# Patient Record
Sex: Male | Born: 1935 | Race: White | Hispanic: No | Marital: Married | Smoking: Former smoker
Health system: Southern US, Community
[De-identification: ages and names within clinical notes are randomized; demographics above are authoritative.]

## PROBLEM LIST (undated history)

## (undated) DIAGNOSIS — Z860100 Personal history of colon polyps, unspecified: Secondary | ICD-10-CM

## (undated) DIAGNOSIS — H9190 Unspecified hearing loss, unspecified ear: Secondary | ICD-10-CM

## (undated) DIAGNOSIS — K862 Cyst of pancreas: Secondary | ICD-10-CM

## (undated) DIAGNOSIS — Z9889 Other specified postprocedural states: Secondary | ICD-10-CM

## (undated) DIAGNOSIS — J449 Chronic obstructive pulmonary disease, unspecified: Secondary | ICD-10-CM

## (undated) DIAGNOSIS — I509 Heart failure, unspecified: Secondary | ICD-10-CM

## (undated) DIAGNOSIS — I6529 Occlusion and stenosis of unspecified carotid artery: Secondary | ICD-10-CM

## (undated) DIAGNOSIS — H8110 Benign paroxysmal vertigo, unspecified ear: Secondary | ICD-10-CM

## (undated) DIAGNOSIS — C4490 Unspecified malignant neoplasm of skin, unspecified: Secondary | ICD-10-CM

## (undated) DIAGNOSIS — H3530 Unspecified macular degeneration: Secondary | ICD-10-CM

## (undated) DIAGNOSIS — Z86018 Personal history of other benign neoplasm: Secondary | ICD-10-CM

## (undated) DIAGNOSIS — E041 Nontoxic single thyroid nodule: Secondary | ICD-10-CM

## (undated) DIAGNOSIS — K219 Gastro-esophageal reflux disease without esophagitis: Secondary | ICD-10-CM

## (undated) DIAGNOSIS — I25810 Atherosclerosis of coronary artery bypass graft(s) without angina pectoris: Secondary | ICD-10-CM

## (undated) DIAGNOSIS — H548 Legal blindness, as defined in USA: Secondary | ICD-10-CM

## (undated) DIAGNOSIS — E7800 Pure hypercholesterolemia, unspecified: Secondary | ICD-10-CM

## (undated) DIAGNOSIS — M48061 Spinal stenosis, lumbar region without neurogenic claudication: Secondary | ICD-10-CM

## (undated) DIAGNOSIS — I639 Cerebral infarction, unspecified: Secondary | ICD-10-CM

## (undated) DIAGNOSIS — I359 Nonrheumatic aortic valve disorder, unspecified: Secondary | ICD-10-CM

## (undated) DIAGNOSIS — Z8546 Personal history of malignant neoplasm of prostate: Secondary | ICD-10-CM

## (undated) HISTORY — PX: OTHER SURGICAL HISTORY: SHX169

## (undated) HISTORY — DX: Nonrheumatic aortic valve disorder, unspecified: I35.9

## (undated) HISTORY — PX: EYE SURGERY: SHX253

## (undated) HISTORY — DX: Personal history of colonic polyps: Z86.010

## (undated) HISTORY — PX: SHOULDER SURGERY: SHX246

## (undated) HISTORY — PX: CATARACT EXTRACTION: SUR2

## (undated) HISTORY — DX: Heart failure, unspecified: I50.9

## (undated) HISTORY — DX: Other specified postprocedural states: Z86.018

## (undated) HISTORY — PX: CORONARY ARTERY BYPASS GRAFT: SHX141

## (undated) HISTORY — DX: Legal blindness, as defined in USA: H54.8

## (undated) HISTORY — DX: Atherosclerosis of coronary artery bypass graft(s) without angina pectoris: I25.810

## (undated) HISTORY — DX: Personal history of colon polyps, unspecified: Z86.0100

## (undated) HISTORY — DX: Other specified postprocedural states: Z98.890

## (undated) HISTORY — DX: Gastro-esophageal reflux disease without esophagitis: K21.9

## (undated) HISTORY — DX: Pure hypercholesterolemia, unspecified: E78.00

## (undated) HISTORY — DX: Cerebral infarction, unspecified: I63.9

## (undated) HISTORY — PX: TOTAL KNEE ARTHROPLASTY: SHX125

## (undated) HISTORY — DX: Cyst of pancreas: K86.2

## (undated) HISTORY — DX: Nontoxic single thyroid nodule: E04.1

## (undated) HISTORY — DX: Unspecified hearing loss, unspecified ear: H91.90

## (undated) HISTORY — DX: Personal history of malignant neoplasm of prostate: Z85.46

## (undated) HISTORY — DX: Spinal stenosis, lumbar region without neurogenic claudication: M48.061

## (undated) HISTORY — DX: Unspecified macular degeneration: H35.30

## (undated) HISTORY — PX: CHOLECYSTECTOMY: SHX55

## (undated) HISTORY — DX: Unspecified malignant neoplasm of skin, unspecified: C44.90

## (undated) HISTORY — DX: Occlusion and stenosis of unspecified carotid artery: I65.29

## (undated) HISTORY — DX: Benign paroxysmal vertigo, unspecified ear: H81.10

## (undated) HISTORY — DX: Chronic obstructive pulmonary disease, unspecified: J44.9

---
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
## 1983-01-23 HISTORY — PX: CRANIECTOMY FOR EXCISION OF ACOUSTIC NEUROMA: SUR324

## 1999-01-13 ENCOUNTER — Encounter (INDEPENDENT_AMBULATORY_CARE_PROVIDER_SITE_OTHER): Payer: Self-pay | Admitting: Gastroenterology

## 1999-02-06 ENCOUNTER — Encounter: Payer: Self-pay | Admitting: Gastroenterology

## 1999-02-06 ENCOUNTER — Ambulatory Visit (HOSPITAL_COMMUNITY): Admission: RE | Admit: 1999-02-06 | Discharge: 1999-02-06 | Payer: Self-pay | Admitting: Gastroenterology

## 1999-02-17 ENCOUNTER — Ambulatory Visit (HOSPITAL_COMMUNITY): Admission: RE | Admit: 1999-02-17 | Discharge: 1999-02-17 | Payer: Self-pay | Admitting: Gastroenterology

## 1999-02-17 ENCOUNTER — Encounter: Payer: Self-pay | Admitting: Gastroenterology

## 1999-07-17 ENCOUNTER — Other Ambulatory Visit: Admission: RE | Admit: 1999-07-17 | Discharge: 1999-07-17 | Payer: Self-pay | Admitting: Urology

## 1999-08-14 ENCOUNTER — Ambulatory Visit (HOSPITAL_COMMUNITY): Admission: RE | Admit: 1999-08-14 | Discharge: 1999-08-15 | Payer: Self-pay | Admitting: Cardiology

## 1999-11-03 ENCOUNTER — Other Ambulatory Visit: Admission: RE | Admit: 1999-11-03 | Discharge: 1999-11-03 | Payer: Self-pay | Admitting: Urology

## 1999-11-03 ENCOUNTER — Encounter (INDEPENDENT_AMBULATORY_CARE_PROVIDER_SITE_OTHER): Payer: Self-pay | Admitting: Specialist

## 1999-11-17 ENCOUNTER — Encounter: Admission: RE | Admit: 1999-11-17 | Discharge: 2000-02-15 | Payer: Self-pay | Admitting: Radiation Oncology

## 2000-01-23 DIAGNOSIS — Z8546 Personal history of malignant neoplasm of prostate: Secondary | ICD-10-CM

## 2000-01-23 HISTORY — DX: Personal history of malignant neoplasm of prostate: Z85.46

## 2000-02-13 ENCOUNTER — Ambulatory Visit (HOSPITAL_BASED_OUTPATIENT_CLINIC_OR_DEPARTMENT_OTHER): Admission: RE | Admit: 2000-02-13 | Discharge: 2000-02-13 | Payer: Self-pay | Admitting: Urology

## 2000-02-13 ENCOUNTER — Encounter: Payer: Self-pay | Admitting: Urology

## 2000-03-05 ENCOUNTER — Encounter: Admission: RE | Admit: 2000-03-05 | Discharge: 2000-06-03 | Payer: Self-pay | Admitting: Radiation Oncology

## 2000-09-19 ENCOUNTER — Ambulatory Visit (HOSPITAL_COMMUNITY): Admission: RE | Admit: 2000-09-19 | Discharge: 2000-09-20 | Payer: Self-pay | Admitting: Cardiology

## 2000-09-19 ENCOUNTER — Encounter: Payer: Self-pay | Admitting: Cardiology

## 2000-11-12 ENCOUNTER — Encounter: Admission: RE | Admit: 2000-11-12 | Discharge: 2000-11-12 | Payer: Self-pay | Admitting: Nephrology

## 2000-11-12 ENCOUNTER — Encounter: Payer: Self-pay | Admitting: Nephrology

## 2002-06-23 ENCOUNTER — Encounter (INDEPENDENT_AMBULATORY_CARE_PROVIDER_SITE_OTHER): Payer: Self-pay | Admitting: Gastroenterology

## 2003-05-12 ENCOUNTER — Encounter: Admission: RE | Admit: 2003-05-12 | Discharge: 2003-05-12 | Payer: Self-pay | Admitting: Family Medicine

## 2003-05-19 ENCOUNTER — Encounter: Admission: RE | Admit: 2003-05-19 | Discharge: 2003-07-14 | Payer: Self-pay | Admitting: Family Medicine

## 2003-12-09 ENCOUNTER — Ambulatory Visit: Payer: Self-pay | Admitting: Internal Medicine

## 2004-03-31 ENCOUNTER — Ambulatory Visit: Payer: Self-pay | Admitting: Cardiology

## 2004-04-04 ENCOUNTER — Ambulatory Visit: Payer: Self-pay | Admitting: Internal Medicine

## 2004-04-11 ENCOUNTER — Ambulatory Visit: Payer: Self-pay | Admitting: Acute Care

## 2004-04-18 ENCOUNTER — Ambulatory Visit: Payer: Self-pay | Admitting: Internal Medicine

## 2004-10-11 ENCOUNTER — Ambulatory Visit: Payer: Self-pay | Admitting: Internal Medicine

## 2004-10-12 ENCOUNTER — Ambulatory Visit: Payer: Self-pay | Admitting: Cardiology

## 2004-10-25 ENCOUNTER — Ambulatory Visit: Payer: Self-pay | Admitting: Internal Medicine

## 2004-10-31 ENCOUNTER — Encounter
Admission: RE | Admit: 2004-10-31 | Discharge: 2004-10-31 | Payer: Self-pay | Admitting: Physical Medicine and Rehabilitation

## 2004-11-03 ENCOUNTER — Ambulatory Visit: Payer: Self-pay | Admitting: Cardiology

## 2004-11-03 ENCOUNTER — Ambulatory Visit: Payer: Self-pay | Admitting: Internal Medicine

## 2005-01-29 ENCOUNTER — Encounter
Admission: RE | Admit: 2005-01-29 | Discharge: 2005-01-29 | Payer: Self-pay | Admitting: Physical Medicine and Rehabilitation

## 2005-05-09 ENCOUNTER — Ambulatory Visit: Payer: Self-pay | Admitting: Cardiology

## 2005-05-29 ENCOUNTER — Ambulatory Visit: Payer: Self-pay

## 2005-06-19 ENCOUNTER — Ambulatory Visit: Payer: Self-pay | Admitting: Cardiology

## 2005-10-24 ENCOUNTER — Ambulatory Visit: Payer: Self-pay | Admitting: Cardiology

## 2005-11-02 ENCOUNTER — Ambulatory Visit: Payer: Self-pay

## 2005-11-02 ENCOUNTER — Encounter: Payer: Self-pay | Admitting: Cardiology

## 2005-11-13 ENCOUNTER — Ambulatory Visit: Payer: Self-pay | Admitting: Internal Medicine

## 2006-02-22 ENCOUNTER — Ambulatory Visit: Payer: Self-pay | Admitting: Cardiology

## 2006-04-09 ENCOUNTER — Ambulatory Visit: Payer: Self-pay | Admitting: Cardiology

## 2006-06-04 ENCOUNTER — Ambulatory Visit: Payer: Self-pay

## 2006-07-11 ENCOUNTER — Ambulatory Visit: Payer: Self-pay | Admitting: Cardiology

## 2006-09-25 ENCOUNTER — Ambulatory Visit: Payer: Self-pay | Admitting: Cardiology

## 2007-01-02 ENCOUNTER — Ambulatory Visit: Payer: Self-pay

## 2007-01-02 ENCOUNTER — Ambulatory Visit: Payer: Self-pay | Admitting: Cardiology

## 2007-04-02 ENCOUNTER — Ambulatory Visit: Payer: Self-pay | Admitting: Cardiology

## 2007-06-04 ENCOUNTER — Ambulatory Visit: Payer: Self-pay

## 2007-06-04 ENCOUNTER — Ambulatory Visit: Payer: Self-pay | Admitting: Cardiology

## 2007-06-09 ENCOUNTER — Ambulatory Visit: Payer: Self-pay | Admitting: Surgery

## 2007-08-28 ENCOUNTER — Ambulatory Visit: Payer: Self-pay | Admitting: Cardiology

## 2007-12-01 ENCOUNTER — Ambulatory Visit: Payer: Self-pay | Admitting: Surgery

## 2007-12-11 ENCOUNTER — Ambulatory Visit: Payer: Self-pay | Admitting: Cardiology

## 2008-01-23 HISTORY — PX: CATARACT EXTRACTION: SUR2

## 2008-03-09 ENCOUNTER — Ambulatory Visit: Payer: Self-pay | Admitting: Cardiology

## 2008-03-09 ENCOUNTER — Ambulatory Visit: Payer: Self-pay

## 2008-03-09 ENCOUNTER — Encounter: Payer: Self-pay | Admitting: Cardiology

## 2008-03-11 ENCOUNTER — Ambulatory Visit: Payer: Self-pay | Admitting: Cardiology

## 2008-03-11 LAB — CONVERTED CEMR LAB
ALT: 23 units/L (ref 0–53)
AST: 27 units/L (ref 0–37)
Alkaline Phosphatase: 59 units/L (ref 39–117)
Bilirubin, Direct: 0.2 mg/dL (ref 0.0–0.3)
Total Protein: 6.2 g/dL (ref 6.0–8.3)

## 2008-05-19 ENCOUNTER — Ambulatory Visit: Payer: Self-pay | Admitting: Ophthalmology

## 2008-05-31 ENCOUNTER — Ambulatory Visit: Payer: Self-pay | Admitting: Ophthalmology

## 2008-06-07 ENCOUNTER — Ambulatory Visit: Payer: Self-pay | Admitting: Surgery

## 2008-06-18 DIAGNOSIS — K219 Gastro-esophageal reflux disease without esophagitis: Secondary | ICD-10-CM

## 2008-06-18 DIAGNOSIS — I209 Angina pectoris, unspecified: Secondary | ICD-10-CM

## 2008-06-18 DIAGNOSIS — Z951 Presence of aortocoronary bypass graft: Secondary | ICD-10-CM

## 2008-06-18 DIAGNOSIS — I350 Nonrheumatic aortic (valve) stenosis: Secondary | ICD-10-CM | POA: Insufficient documentation

## 2008-06-18 DIAGNOSIS — E7800 Pure hypercholesterolemia, unspecified: Secondary | ICD-10-CM

## 2008-06-23 ENCOUNTER — Ambulatory Visit: Payer: Self-pay | Admitting: Cardiology

## 2008-07-21 ENCOUNTER — Encounter: Payer: Self-pay | Admitting: Cardiology

## 2008-07-21 ENCOUNTER — Encounter: Payer: Self-pay | Admitting: Family Medicine

## 2008-09-01 ENCOUNTER — Ambulatory Visit: Payer: Self-pay | Admitting: Family Medicine

## 2008-09-01 DIAGNOSIS — Z860100 Personal history of colon polyps, unspecified: Secondary | ICD-10-CM | POA: Insufficient documentation

## 2008-09-01 DIAGNOSIS — Z9189 Other specified personal risk factors, not elsewhere classified: Secondary | ICD-10-CM | POA: Insufficient documentation

## 2008-09-01 DIAGNOSIS — J309 Allergic rhinitis, unspecified: Secondary | ICD-10-CM | POA: Insufficient documentation

## 2008-10-01 ENCOUNTER — Ambulatory Visit: Payer: Self-pay | Admitting: Internal Medicine

## 2008-12-13 ENCOUNTER — Ambulatory Visit: Payer: Self-pay | Admitting: Surgery

## 2008-12-22 ENCOUNTER — Ambulatory Visit: Payer: Self-pay | Admitting: Cardiology

## 2009-01-03 ENCOUNTER — Ambulatory Visit: Payer: Self-pay | Admitting: Family Medicine

## 2009-05-04 ENCOUNTER — Encounter: Admission: RE | Admit: 2009-05-04 | Discharge: 2009-05-04 | Payer: Self-pay | Admitting: Otolaryngology

## 2009-05-06 ENCOUNTER — Ambulatory Visit: Payer: Self-pay | Admitting: Cardiovascular Disease

## 2009-05-06 ENCOUNTER — Ambulatory Visit: Payer: Self-pay | Admitting: Cardiology

## 2009-05-06 ENCOUNTER — Ambulatory Visit (HOSPITAL_COMMUNITY): Admission: RE | Admit: 2009-05-06 | Discharge: 2009-05-06 | Payer: Self-pay | Admitting: Cardiology

## 2009-05-06 ENCOUNTER — Encounter: Payer: Self-pay | Admitting: Cardiology

## 2009-05-06 ENCOUNTER — Ambulatory Visit: Payer: Self-pay

## 2009-05-06 ENCOUNTER — Encounter (INDEPENDENT_AMBULATORY_CARE_PROVIDER_SITE_OTHER): Payer: Self-pay | Admitting: *Deleted

## 2009-05-18 LAB — CONVERTED CEMR LAB
ALT: 22 units/L (ref 0–53)
AST: 30 units/L (ref 0–37)
Alkaline Phosphatase: 48 units/L (ref 39–117)
Total Bilirubin: 1.1 mg/dL (ref 0.3–1.2)
Total CHOL/HDL Ratio: 3
Total Protein: 6.2 g/dL (ref 6.0–8.3)

## 2009-05-23 ENCOUNTER — Ambulatory Visit: Payer: Self-pay | Admitting: Surgery

## 2009-07-05 ENCOUNTER — Ambulatory Visit: Payer: Self-pay | Admitting: Family Medicine

## 2009-07-10 LAB — CONVERTED CEMR LAB
AST: 26 units/L (ref 0–37)
Alkaline Phosphatase: 52 units/L (ref 39–117)
Basophils Absolute: 0 10*3/uL (ref 0.0–0.1)
Bilirubin Urine: NEGATIVE
CO2: 29 meq/L (ref 19–32)
Calcium: 9 mg/dL (ref 8.4–10.5)
Chloride: 107 meq/L (ref 96–112)
Creatinine, Ser: 1.2 mg/dL (ref 0.4–1.5)
Eosinophils Absolute: 0.2 10*3/uL (ref 0.0–0.7)
Glucose, Bld: 83 mg/dL (ref 70–99)
HCT: 45.3 % (ref 39.0–52.0)
Hemoglobin, Urine: NEGATIVE
Ketones, ur: NEGATIVE mg/dL
MCHC: 34.7 g/dL (ref 30.0–36.0)
MCV: 103.4 fL — ABNORMAL HIGH (ref 78.0–100.0)
Monocytes Relative: 8.5 % (ref 3.0–12.0)
Platelets: 106 10*3/uL — ABNORMAL LOW (ref 150.0–400.0)
RDW: 13.4 % (ref 11.5–14.6)
Specific Gravity, Urine: 1.02 (ref 1.000–1.030)
TSH: 1.41 microintl units/mL (ref 0.35–5.50)
Total Bilirubin: 0.9 mg/dL (ref 0.3–1.2)
Urine Glucose: NEGATIVE mg/dL
Urobilinogen, UA: 0.2 (ref 0.0–1.0)
pH: 6 (ref 5.0–8.0)

## 2009-07-13 ENCOUNTER — Ambulatory Visit: Payer: Self-pay | Admitting: Family Medicine

## 2009-07-13 DIAGNOSIS — M48061 Spinal stenosis, lumbar region without neurogenic claudication: Secondary | ICD-10-CM

## 2009-07-13 DIAGNOSIS — M48062 Spinal stenosis, lumbar region with neurogenic claudication: Secondary | ICD-10-CM

## 2009-07-13 HISTORY — DX: Spinal stenosis, lumbar region without neurogenic claudication: M48.061

## 2009-09-09 ENCOUNTER — Ambulatory Visit: Payer: Self-pay | Admitting: Cardiology

## 2009-09-21 ENCOUNTER — Telehealth: Payer: Self-pay | Admitting: Cardiology

## 2009-10-04 ENCOUNTER — Encounter: Payer: Self-pay | Admitting: Cardiology

## 2009-10-04 ENCOUNTER — Ambulatory Visit: Payer: Self-pay | Admitting: Ophthalmology

## 2009-10-07 ENCOUNTER — Ambulatory Visit: Payer: Self-pay | Admitting: Family Medicine

## 2009-10-07 DIAGNOSIS — H8110 Benign paroxysmal vertigo, unspecified ear: Secondary | ICD-10-CM

## 2009-10-12 ENCOUNTER — Ambulatory Visit: Payer: Self-pay | Admitting: Ophthalmology

## 2010-02-03 ENCOUNTER — Ambulatory Visit
Admission: RE | Admit: 2010-02-03 | Discharge: 2010-02-03 | Payer: Self-pay | Source: Home / Self Care | Attending: Surgery | Admitting: Surgery

## 2010-02-13 ENCOUNTER — Ambulatory Visit
Admission: RE | Admit: 2010-02-13 | Discharge: 2010-02-13 | Payer: Self-pay | Source: Home / Self Care | Attending: Surgery | Admitting: Surgery

## 2010-02-13 ENCOUNTER — Encounter: Payer: Self-pay | Admitting: Cardiology

## 2010-02-14 NOTE — Procedures (Unsigned)
CAROTID DUPLEX EXAM  INDICATION:  Followup known carotid disease.  HISTORY: Diabetes:  Yes. Cardiac:  Yes. Hypertension:  Yes. Smoking:  Previous. Previous Surgery:  No. CV History: Amaurosis Fugax No, Paresthesias No, Hemiparesis No                                      RIGHT             LEFT Brachial systolic pressure:         142               138 Brachial Doppler waveforms:         WNL               WNL Vertebral direction of flow:        Antegrade         Antegrade DUPLEX VELOCITIES (cm/sec) CCA peak systolic                   54                52 ECA peak systolic                   85                78 ICA peak systolic                   135               277 ICA end diastolic                   35                102 PLAQUE MORPHOLOGY:                  Irregular         Irregular PLAQUE AMOUNT:                      Moderate          Severe PLAQUE LOCATION:                    CCA/ICA           ICA  IMPRESSION: 1. Highly irregular plaque resulting in a 1% to 39% stenosis on the     right and a 60% to 79% on the left by velocity criteria. 2. Plaque is very difficult to evaluate and a higher degree of     stenosis is suspected bilaterally. 3. Left internal carotid artery plaque appears soft with areas of     calcification, concern for ulcerative plaque. 4. Vertebral arteries are antegrade bilaterally.  ___________________________________________ V. Charlena Cross, MD  LT/MEDQ  D:  02/03/2010  T:  02/03/2010  Job:  [id number]  cc:   [name]. Riley Kill, MD, [facility]

## 2010-02-14 NOTE — Assessment & Plan Note (Addendum)
OFFICE VISIT  Andrew [name] DOB:  June 28, 1923                                       02/13/2010 CHART#:[id number]  The patient comes back in today for followup.  He is very pleasant 77- year-old gentleman I have been following for extracranial carotid disease at the request of Dr. Riley [name].  To date ultrasounds have continued to be in the 60% to 80% range.  The patient continues to deny symptoms, specifically no numbness or weakness in either extremity, no amaurosis fugax or slurring of his speech.  The patient continues to be managed for his carotid disease by Dr. Riley [name].  His blood pressure is well-controlled in the mid 130's to 140's.  He is taking a full aspirin.  He continues to be a nonsmoker. He is on a statin.  REVIEW OF SYSTEMS:  VASCULAR:  Positive for pain in legs with walking. CARDIAC:  Positive for chest pain, chest tightness and shortness of breath with exertion. GI:  Positive for reflux. GU:  Positive for renal insufficiency. ENT:  Positive for recent change in hearing. MUSCULOSKELETAL:  Positive for arthritis.  PHYSICAL EXAMINATION:  Vital signs:  His heart rate is 69, blood pressure 177/86, respiratory rate 20.  General:  He is well-appearing, in no distress.  HEENT:  Within normal limits.  Lungs:  Clear bilaterally.  Cardiovascular:  Regular rate and rhythm.  No murmur.  No carotid bruits.  Extremities:  Warm and well-perfused.  Neurological: He is intact.  DIAGNOSTIC STUDIES:  Carotid ultrasound shows a highly irregular plaque resulting in a 1% to 39% stenosis on the right and 60% to 79% stenosis on the left.  The plaque was very difficult to evaluate and a higher degree of stenosis was suspected bilaterally.  ASSESSMENT:  Asymptomatic carotid disease, left greater than right.  PLAN:  Based on the recent ultrasound findings I discussed extensively with the patient and his wife the need for additional imaging to further evaluate  his carotid disease.  I told them that we would recommend operation if the stenosis were greater than 80% and based on the most recent ultrasound there was concern that this could be the case.  In addition, I was concerned about ulcerated plaque in this area.  We discussed proceeding with CT angiogram versus a formal angiogram and have elected to proceed with CT scan.  This will be done within the next week and the patient will follow up after his scan and will make recommendations for carotid revascularization based on the findings on the CAT scan.    Andrew Ny, MD Electronically Signed  VWB/MEDQ  D:  02/13/2010  T:  02/14/2010  Job:  [id number]  cc:   Andrew [name]. Andrew Kill, MD, [facility] Andrew [name]

## 2010-02-20 ENCOUNTER — Other Ambulatory Visit: Payer: Self-pay | Admitting: *Deleted

## 2010-02-20 DIAGNOSIS — I6523 Occlusion and stenosis of bilateral carotid arteries: Secondary | ICD-10-CM

## 2010-02-23 NOTE — Assessment & Plan Note (Signed)
Summary: f52m  Medications Added VITAMIN A & D 8000-400 UNIT CAPS (VITAMINS A & D) Take 1 tablet by mouth once a day      Allergies Added: NKDA  Visit Type:  4 months follow up Referring Provider:  Hannah Beat, MD Primary Provider:  Hannah Beat, MD  CC:  Angina.  History of Present Illness: Is still able to exercise, but with some angina.  His back has been bothering him alot.  Has had prior shots to low back.  Shots work for [employer].    Current Medications (verified): 1)  Prilosec Otc 20 Mg Tbec (Omeprazole Magnesium) .... Take 1 Tablet By Mouth Once A Day 2)  Pravastatin Sodium 40 Mg Tabs (Pravastatin Sodium) .... Take 1 Tablet By Mouth Once A Day 3)  Eye Vitamins  Caps (Multiple Vitamins-Minerals) .... Take 1 Capsule By Mouth Two Times A Day 4)  Lutein 6 Mg Caps (Lutein) .... Take 1 Capsule By Mouth Once A Day 5)  Nephro-Vite Rx 1 Mg Tabs (B Complex-C-Folic Acid) .... Take 1 Tablet By Mouth Once A Day 6)  Imdur 60 Mg Xr24h-Tab (Isosorbide Mononitrate) .... Take 1 Tablet By Mouth Once A Day 7)  Aspirin Ec 325 Mg Tbec (Aspirin) .... Take One Tablet By Mouth Daily 8)  Nitrolingual 0.4 Mg/spray Soln (Nitroglycerin) .... As Needed 9)  Fluticasone Propionate 50 Mcg/act Susp (Fluticasone Propionate) .... 2 Puffs Each Nostril Daily 10)  Vitamin A & D 8000-400 Unit Caps (Vitamins A & D) .... Take 1 Tablet By Mouth Once A Day  Allergies (verified): No Known Drug Allergies  Vital Signs:  Patient profile:   77 year old male Height:      70 inches Weight:      193 pounds Pulse rate:   62 / minute Pulse rhythm:   regular Resp:     18 per minute BP sitting:   135 / 72  (left arm) Cuff size:   large  Vitals Entered By: Vikki Ports (May 06, 2009 12:30 PM)  Physical Exam  General:  Well developed, well nourished, in no acute distress. Head:  normocephalic and atraumatic Eyes:  PERRLA/EOM intact; conjunctiva and lids normal. Lungs:  Clear bilaterally to auscultation and  percussion. Heart:  PMI non displaced.  Normal S1 and S2.  SEM  2/6.  No DM. Abdomen:  Bowel sounds positive; abdomen soft and non-tender without masses, organomegaly, or hernias noted. No hepatosplenomegaly. Extremities:  No clubbing or cyanosis.  No edema. Neurologic:  Alert and oriented x 3.   EKG  Procedure date:  05/06/2009  Findings:      NSR.  WNL.    Echocardiogram  Procedure date:  05/06/2009  Findings:       Study Conclusions            - Left ventricle: The cavity size was mildly dilated. Wall thickness       was increased in a pattern of mild LVH. Systolic function was       normal. The estimated ejection fraction was in the range of 50% to       55%.     - Aortic valve: Fusion of the right and left coronary cusps There       was mild stenosis. Mild regurgitation. Valve area: 1.22cm 2(VTI).       Valve area: 1.16cm 2 (Vmax).     - Mitral valve: Mild regurgitation.     - Left atrium: The atrium was mildly dilated.  Impression & Recommendations:  Problem # 1:  CAD, ARTERY BYPASS GRAFT (ICD-414.04) Continues with mild angina with activity.  Favor continued medical therapy.   His updated medication list for this problem includes:    Imdur 60 Mg Xr24h-tab (Isosorbide mononitrate) .Marland Kitchen... Take 1 tablet by mouth once a day    Aspirin Ec 325 Mg Tbec (Aspirin) .Marland Kitchen... Take one tablet by mouth daily    Nitrolingual 0.4 Mg/spray Soln (Nitroglycerin) .Marland Kitchen... 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 His updated medication list for this problem includes:    Imdur 60 Mg Xr24h-tab (Isosorbide mononitrate) .Marland Kitchen... Take 1 tablet by mouth once a day    Aspirin Ec 325 Mg Tbec (Aspirin) .Marland Kitchen... Take one tablet by mouth daily    Nitrolingual 0.4 Mg/spray Soln (Nitroglycerin) .Marland Kitchen... As needed  Problem # 3:  AORTIC STENOSIS (ICD-424.1) Moderate by echo.  See report.  No action planned.  His updated medication list  for this problem includes:    Imdur 60 Mg Xr24h-tab (Isosorbide mononitrate) .Marland Kitchen... Take 1 tablet by mouth once a day    Nitrolingual 0.4 Mg/spray Soln (Nitroglycerin) .Marland Kitchen... As needed  Problem # 4:  HYPERCHOLESTEROLEMIA (ICD-272.0) Continues on medical therapy. His updated medication list for this problem includes:    Pravastatin Sodium 40 Mg Tabs (Pravastatin sodium) .Marland Kitchen... Take 1 tablet by mouth once a day  Patient Instructions: 1)  Your physician recommends that you schedule a follow-up appointment in: 4 MONTHS 2)  Your physician recommends that you continue on your current medications as directed. Please refer to the Current Medication list given to you today.

## 2010-02-23 NOTE — Assessment & Plan Note (Signed)
Summary: F/U ON LABS  CYD   Vital Signs:  Patient profile:   77 year old male Height:      70 inches Weight:      195.6 pounds BMI:     28.17 Temp:     97.7 degrees F oral Pulse rate:   68 / minute Pulse rhythm:   regular BP sitting:   118 / 80  (left arm) Cuff size:   regular  Vitals Entered By: Benny Lennert CMA Duncan Dull) (July 13, 2009 12:06 PM)  History of Present Illness: .Chief complaint follow up labs  77 year old male:  pleasant gentleman with multiple medical problems including coronary disease, status post CABG, with chronic ongoing class II or 3 angina, aortic stenosis, status post prostate cancer treatment  and mild renal insufficiency who presents in followup.  His primary complaints today have to do his back. 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 Status post multiple multiple rounds of epidural steroid injections, which have provided some relief. He has not been as active recently and has had some weakness  in his legs.  Having some difficulty with back has had some epidural injections in the past.  will have a give out sensation on the right in the past. sensation as if it is going to go out at times.  Mildly low platelets, which were also slightly low on prior  CBC. Asymptomatic and not bleeding.    Clinical Review Panels:  Prevention   Last Colonoscopy:  refused (01/03/2009)   Last PSA:  refused (01/03/2009)  Immunizations   Last Flu Vaccine:  given (11/03/2008)   Last Zoster Vaccine:  given (01/22/2006)  Lipid Management   Cholesterol:  116 (05/06/2009)   LDL (bad choesterol):  56 (05/06/2009)   HDL (good cholesterol):  39.60 (05/06/2009)  CBC   WBC:  6.3 (07/05/2009)   RBC:  4.39 (07/05/2009)   Hgb:  15.7 (07/05/2009)   Hct:  45.3 (07/05/2009)   Platelets:  106.0 (07/05/2009)   MCV  103.4 (07/05/2009)   MCHC  34.7 (07/05/2009)   RDW  13.4 (07/05/2009)   PMN:  51.9 (07/05/2009)   Lymphs:  35.6  (07/05/2009)   Monos:  8.5 (07/05/2009)   Eosinophils:  3.4 (07/05/2009)   Basophil:  0.6 (07/05/2009)  Complete Metabolic Panel   Glucose:  83 (07/05/2009)   Sodium:  146 (07/05/2009)   Potassium:  4.3 (07/05/2009)   Chloride:  107 (07/05/2009)   CO2:  29 (07/05/2009)   BUN:  25 (07/05/2009)   Creatinine:  1.2 (07/05/2009)   Albumin:  3.7 (07/05/2009)   Total Protein:  5.8 (07/05/2009)   Calcium:  9.0 (07/05/2009)   Total Bili:  0.9 (07/05/2009)   Alk Phos:  52 (07/05/2009)   SGPT (ALT):  21 (07/05/2009)   SGOT (AST):  26 (07/05/2009)   Allergies (verified): No Known Drug Allergies  Past History:  Past medical, surgical, family and social histories (including risk factors) reviewed, and no changes noted (except as noted below).  Past Medical History: Reviewed history from 10/01/2008 and no changes required. CAD, ARTERY BYPASS GRAFT (ICD-414.04)- bypass graft surgery with recurrent disease in class ll-lll angina ANGINA PECTORIS, CLASS II (ICD-413.9) AORTIC STENOSIS (ICD-424.1) Prostate Cancer, 2002, treated with seeds HYPERCHOLESTEROLEMIA (ICD-272.0) RENAL DISEASE, CHRONIC, MILD (ICD-585.2) GASTROESOPHAGEAL REFLUX DISEASE (ICD-530.81) CHICKENPOX, HX OF (ICD-V15.9) Skin Cancer Colonic polyps (destroyed in 1999 and 2004) Allergic rhinitis  Past Surgical History: Reviewed history from 09/01/2008 and no changes  required. bypass graft surgery. Transperineal implantation of palladium-103 seeds in the prostate, 2002 Cholecystectomy. Knee and shoulder surgery. Acoustic Neuroma, 1985 cataract, 2010 Tonsils and adenoids, 1932  Family History: Reviewed history from 10/01/2008 and no changes required. Positive for emphysema in his brother. Family History of Alcoholism/Addiction Family History of Arthritis Family History of Cardiovascular disorder Mac degeneration Family History of Colon Cancer: sister Family History of Heart Disease: father, brother  Social  History: Reviewed history from 10/01/2008 and no changes required. He quit smoking in 1963. He is retired from [employer] and [employer] Married Alcohol Use - yes one glass at night Daily Caffeine Use one cup coffee at night Illicit Drug Use - no  Review of Systems      See HPI General:  Complains of fatigue; denies chills and fever. CV:  Complains of chest pain or discomfort, shortness of breath with exertion, and swelling of feet; some edema, LE. MS:  Complains of low back pain, stiffness, and thoracic pain. Neuro:  Complains of tingling.  Physical Exam  General:  Well developed, well nourished, in no acute distress. Elderly Head:  normocephalic, atraumatic, and no abnormalities observed.   Ears:  no external deformities.   Nose:  no external deformity.   Neck:  No deformities, masses, or tenderness noted. Lungs:  Clear bilaterally to auscultation.normal respiratory effort, no intercostal retractions, no accessory muscle use, and normal breath sounds.   Heart:  Soft SEM.  Normal S1 and S2.regular rhythm, no gallop, and no rub.   Extremities:  tr LE edema Neurologic:  alert & oriented X3 and gait normal.   Cervical Nodes:  No lymphadenopathy noted Psych:  Cognition and judgment appear intact. Alert and cooperative with normal attention span and concentration. No apparent delusions, illusions, hallucinations   Impression & Recommendations:  Problem # 1:  HYPERCHOLESTEROLEMIA (ICD-272.0) Assessment Unchanged  His updated medication list for this problem includes:    Pravastatin Sodium 40 Mg Tabs (Pravastatin sodium) .Marland Kitchen... Take 1 tablet by mouth once a day  Labs Reviewed: SGOT: 26 (07/05/2009)   SGPT: 21 (07/05/2009)   HDL:39.60 (05/06/2009), 38.2 (03/11/2008)  LDL:56 (05/06/2009), 79 (03/11/2008)  Chol:116 (05/06/2009), 140 (03/11/2008)  Trig:103.0 (05/06/2009), 115 (03/11/2008)  Problem # 2:  CAD, ARTERY BYPASS GRAFT (ICD-414.04)  His updated medication list for this problem  includes:    Imdur 60 Mg Xr24h-tab (Isosorbide mononitrate) .Marland Kitchen... Take 1 tablet by mouth once a day    Aspirin Ec 325 Mg Tbec (Aspirin) .Marland Kitchen... Take one tablet by mouth daily    Nitrolingual 0.4 Mg/spray Soln (Nitroglycerin) .Marland Kitchen... As needed  Problem # 3:  BACK PAIN (ICD-724.5) Assessment: Deteriorated we reviewed some basic lower extremity strengthening exercises using body weight, and I think that maintaining his balance and strength in his lower extremities  and proprioception will likely help him more than anything.  Yard he has an appointment set up with an interventional spine specialist, I think that is reasonable to consider a epidural injection.. We also discussed spinal surgery, and I cautioned him  that I would not think that this is in his best interest and would not want to clear him medically.  His updated medication list for this problem includes:    Aspirin Ec 325 Mg Tbec (Aspirin) .Marland Kitchen... Take one tablet by mouth daily  Problem # 4:  RENAL DISEASE, CHRONIC, MILD (ICD-585.2) Assessment: Improved  Complete Medication List: 1)  Prilosec Otc 20 Mg Tbec (Omeprazole magnesium) .... Take 1 tablet by mouth once a day 2)  Pravastatin Sodium 40 Mg Tabs (Pravastatin sodium) .... Take 1 tablet by mouth once a day 3)  Eye Vitamins Caps (Multiple vitamins-minerals) .... Take 1 capsule by mouth two times a day 4)  Lutein 6 Mg Caps (Lutein) .... Take 1 capsule by mouth once a day 5)  Nephro-vite Rx 1 Mg Tabs (B complex-c-folic acid) .... Take 1 tablet by mouth once a day 6)  Imdur 60 Mg Xr24h-tab (Isosorbide mononitrate) .... Take 1 tablet by mouth once a day 7)  Aspirin Ec 325 Mg Tbec (Aspirin) .... Take one tablet by mouth daily 8)  Nitrolingual 0.4 Mg/spray Soln (Nitroglycerin) .... As needed 9)  Fluticasone Propionate 50 Mcg/act Susp (Fluticasone propionate) .... 2 puffs each nostril daily 10)  Vitamin A & D 8000-400 Unit Caps (Vitamins a & d) .... Take 1 tablet by mouth once a  day  Current Allergies (reviewed today): No known allergies

## 2010-02-23 NOTE — Letter (Signed)
Summary: Outpatient Coinsurance Notice  Outpatient Coinsurance Notice   Imported By: Marylou Mccoy 05/30/2009 11:00:05  _____________________________________________________________________  External Attachment:    Type:   Image     Comment:   External Document

## 2010-02-23 NOTE — Assessment & Plan Note (Signed)
Summary: f63m   Visit Type:  4 months follow up Referring Provider:  Hannah Beat, MD Primary Provider:  Hannah Beat, MD   History of Present Illness: Patient is doing well.  He still has some angina.  This occurs mainly with eating.    Current Medications (verified): 1)  Prilosec Otc 20 Mg Tbec (Omeprazole Magnesium) .... Take 1 Tablet By Mouth Once A Day 2)  Pravastatin Sodium 40 Mg Tabs (Pravastatin Sodium) .... Take 1 Tablet By Mouth Once A Day 3)  Eye Vitamins  Caps (Multiple Vitamins-Minerals) .... Take 1 Capsule By Mouth Two Times A Day 4)  Lutein 6 Mg Caps (Lutein) .... Take 1 Capsule By Mouth Once A Day 5)  Nephro-Vite Rx 1 Mg Tabs (B Complex-C-Folic Acid) .... Take 1 Tablet By Mouth Once A Day 6)  Imdur 60 Mg Xr24h-Tab (Isosorbide Mononitrate) .... Take 1 Tablet By Mouth Once A Day 7)  Aspirin Ec 325 Mg Tbec (Aspirin) .... Take One Tablet By Mouth Daily 8)  Nitrolingual 0.4 Mg/spray Soln (Nitroglycerin) .... As Needed 9)  Vitamin A & D 8000-400 Unit Caps (Vitamins A & D) .... Take 1 Tablet By Mouth Once A Day  Allergies (verified): No Known Drug Allergies  Vital Signs:  Patient profile:   77 year old male Height:      70 inches Weight:      194 pounds BMI:     27.94 Pulse rate:   72 / minute Pulse rhythm:   regular Resp:     18 per minute BP sitting:   110 / 60  (left arm) Cuff size:   large  Vitals Entered By: Vikki Ports (September 09, 2009 9:26 AM)  Physical Exam  General:  Well developed, well nourished, in no acute distress. Head:  normocephalic and atraumatic Eyes:  PERRLA/EOM intact; conjunctiva and lids normal. Lungs:  Clear bilaterally to auscultation and percussion. Heart:  PMI non displaced.  Normal S1 and S2..  Pulses:  pulses normal in all 4 extremities Extremities:  No clubbing or cyanosis.   EKG  Procedure date:  09/09/2009  Findings:      NSR with rare PVC.   )therwise normal.    Impression & Recommendations:  Problem # 1:   CAD, ARTERY BYPASS GRAFT (ICD-414.04) Continues to remain stable on medical regimen.  Will continue current regimen. His updated medication list for this problem includes:    Imdur 60 Mg Xr24h-tab (Isosorbide mononitrate) .Marland Kitchen... Take 1 tablet by mouth once a day    Aspirin Ec 325 Mg Tbec (Aspirin) .Marland Kitchen... Take one tablet by mouth daily    Nitrolingual 0.4 Mg/spray Soln (Nitroglycerin) .Marland Kitchen... As needed  Orders: EKG w/ Interpretation (93000)  Problem # 2:  AORTIC STENOSIS (ICD-424.1) Stable. Will need repeat echo in six months.  His updated medication list for this problem includes:    Imdur 60 Mg Xr24h-tab (Isosorbide mononitrate) .Marland Kitchen... Take 1 tablet by mouth once a day    Nitrolingual 0.4 Mg/spray Soln (Nitroglycerin) .Marland Kitchen... As needed  Problem # 3:  HYPERCHOLESTEROLEMIA (ICD-272.0) continue current regimen.  His updated medication list for this problem includes:    Pravastatin Sodium 40 Mg Tabs (Pravastatin sodium) .Marland Kitchen... Take 1 tablet by mouth once a day  Patient Instructions: 1)  Your physician recommends that you continue on your current medications as directed. Please refer to the Current Medication list given to you today. 2)  Your physician wants you to follow-up in: 6 MONTHS.  You will receive a reminder letter  in the mail two months in advance. If you don't receive a letter, please call our office to schedule the follow-up appointment. 3)  Your physician has requested that you have an echocardiogram in 6 MONTHS.  Echocardiography is a painless test that uses sound waves to create images of your heart. It provides your doctor with information about the size and shape of your heart and how well your heart's chambers and valves are working.  This procedure takes approximately one hour. There are no restrictions for this procedure.  Appended Document: f56m Patient is in need of retinal surgery.  He has moderate AS, and prior CABG with angina.  I spoke with Dr. Champ Mungo in detail, and we  will approve his surgery.  I explained to him his condition, and he felt that block anesthesia should be generally safe for him.  Patients AVA is just over 1.0cm2.  Ermalene Postin

## 2010-02-23 NOTE — Assessment & Plan Note (Signed)
Summary: VERTIGO/CLE   Vital Signs:  Patient profile:   77 year old male Height:      70 inches Weight:      195.0 pounds BMI:     28.08 Temp:     97.7 degrees F oral Pulse rate:   72 / minute Pulse rhythm:   regular BP sitting:   120 / 70  (left arm) Cuff size:   large  Vitals Entered By: Benny Lennert CMA Duncan Dull) (October 07, 2009 11:51 AM)  History of Present Illness: Chief complaint vertigo  2 weeks intermittant vertigo. Room spinning..cannot walk to bathroom...worse when turning head to right, lying on right head. Gave him meclizine. Has started using cane. Has had some episodes in past..occurs about once a year.   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  Told in past good ear over compensating for bad ear.  Had brain MRI for routine follow up with ENT in [facility] 05/04/2009..stable.  Allergies (verified): No Known Drug Allergies  Review of Systems General:  Denies fatigue and fever. ENT:  Denies ear discharge, earache, and ringing in ears; no headache  no new allergy or cold symptoms.  No new hearing loss..does have history of acoustic neuroma surgery..has no balance mechnisms in  Told in past good ear over compensating for bad ear.. CV:  Denies chest pain or discomfort. Resp:  Denies shortness of breath.  Physical Exam  General:  Well developed, well nourished, in no acute distress. Elderly Head:  Normocephalic and atraumatic without obvious abnormalities. No apparent alopecia or balding. Eyes:  No corneal or conjunctival inflammation noted. EOMI. Perrla. Funduscopic exam benign, without hemorrhages, exudates or papilledema. Vision grossly normal. Ears:  External ear exam shows no significant lesions or deformities.  Otoscopic examination reveals cwax B ears Nose:  External nasal examination shows no deformity or inflammation. Nasal mucosa are pink and moist without lesions or  exudates. Mouth:  MMM Neck:  no carotid bruit or thyromegaly no cervical or supraclavicular lymphadenopathy  Lungs:  Normal respiratory effort, chest expands symmetrically. Lungs are clear to auscultation, no crackles or wheezes. Heart:  Normal rate and regular rhythm. S1 and S2 normal without gallop, murmur, click, rub or other extra sounds. Neurologic:  No cranial nerve deficits noted. Station and gait are normal. Plantar reflexes are down-going bilaterally. DTRs are symmetrical throughout. Sensory, motor and coordinative functions appear intact.  No clear nystagmus on EPLY   Impression & Recommendations:  Problem # 1:  BENIGN POSITIONAL VERTIGO (ICD-386.11) Complicaated by past acoustic neuroma.  No new neuro changes.  Will treat with home exercises..consider vestibular PT if not continuing to improve in 1-2 weeks.   Complete Medication List: 1)  Prilosec Otc 20 Mg Tbec (Omeprazole magnesium) .... Take 1 tablet by mouth once a day 2)  Pravastatin Sodium 40 Mg Tabs (Pravastatin sodium) .... Take 1 tablet by mouth once a day 3)  Eye Vitamins Caps (Multiple vitamins-minerals) .... Take 1 capsule by mouth two times a day 4)  Lutein 6 Mg Caps (Lutein) .... Take 1 capsule by mouth once a day 5)  Nephro-vite Rx 1 Mg Tabs (B complex-c-folic acid) .... Take 1 tablet by mouth once a day 6)  Imdur 60 Mg Xr24h-tab (Isosorbide mononitrate) .... Take 1 tablet by mouth once a day 7)  Aspirin Ec 325 Mg Tbec (Aspirin) .... Take one tablet by mouth daily 8)  Nitrolingual 0.4 Mg/spray Soln (Nitroglycerin) .... As  needed 9)  Vitamin A & D 8000-400 Unit Caps (Vitamins a & d) .... Take 1 tablet by mouth once a day  Other Orders: Flu Vaccine 67yrs + MEDICARE PATIENTS (Z6109) Administration Flu vaccine - MCR (U0454)  Patient Instructions: 1)  Start home vestibular retraining exercsies. 2)   Use meclizine only if severe vertigo.Marland Kitchenexpect to be fatigued if take this. 3)   Call if not improving vertigo in  next 2 weeks to get set up for PT.  Current Allergies (reviewed today): No known allergies               Flu Vaccine Consent Questions     Do you have a history of severe allergic reactions to this vaccine? no    Any prior history of allergic reactions to egg and/or gelatin? no    Do you have a sensitivity to the preservative Thimersol? no    Do you have a past history of Guillan-Barre Syndrome? no    Do you currently have an acute febrile illness? no    Have you ever had a severe reaction to latex? no    Vaccine information given and explained to patient? yes    Are you currently pregnant? no    Lot Number:[id number]   Exp Date:07/22/2010   Manufacturer: [employer]    Site Given  Left Deltoid [employer]

## 2010-02-23 NOTE — Progress Notes (Signed)
Summary: needs clearance note  Phone Note From Other Clinic   Caller: Nurse Summary of Call: Per Alvino Chapel pt needs retna surgery please send ekg and any ofc notes. pt surgery is 9/21.   fax [phone] ofc [phone]  Initial call taken by: Edman Circle,  September 21, 2009 10:55 AM  Follow-up for Phone Call        Dr Riley Kill needs to append his OV note if the pt is cleared.  Julieta Gutting, RN, BSN  September 21, 2009 1:14 PM  Per Alvino Chapel pt needs clearance for surgery on the 21st. needs to register at hospital today. ofc [id number] fax [phone]. needs recent ekg and ofc note Asher Muir Little  October 03, 2009 2:32 PM  Additional Follow-up for Phone Call Additional follow up Details #1::        Left message for Alvino Chapel to call back. Julieta Gutting, RN, BSN  October 04, 2009 9:29 AM  [facility]. The pt is scheduled for Retinal Surgery with Dr Champ Mungo.  This will be done under a block.  Fax clearance to [phone].  Will review with Dr Riley Kill.  Julieta Gutting, RN, BSN  October 04, 2009 9:45 AM    Additional Follow-up for Phone Call Additional follow up Details #2::    Dr Riley Kill called Dr Champ Mungo on his cell  [phone].  They would also like copy of EKG faxed with clearance.  Julieta Gutting, RN, BSN  October 04, 2009 6:43 PM  Additional Follow-up for Phone Call Additional follow up Details #3:: Details for Additional Follow-up Action Taken: I spoke with Dr. Champ Mungo who told me they very little problem with block anesthesia.  I explained to him the patients condition whcih is stable, but has prior CABG with angina, mild AS.  Will approve for surgery after discussion with surgeon.   Additional Follow-up by: Ronaldo Miyamoto, MD, [facility],  October 05, 2009 4:51 PM

## 2010-02-27 ENCOUNTER — Ambulatory Visit
Admission: RE | Admit: 2010-02-27 | Discharge: 2010-02-27 | Disposition: A | Discharge status: Home / Self Care | Payer: MEDICARE | Source: Ambulatory Visit | Attending: Surgery | Admitting: Surgery

## 2010-02-27 ENCOUNTER — Encounter: Payer: Self-pay | Admitting: Cardiology

## 2010-02-27 ENCOUNTER — Ambulatory Visit (INDEPENDENT_AMBULATORY_CARE_PROVIDER_SITE_OTHER): Payer: MEDICARE | Admitting: Surgery

## 2010-02-27 ENCOUNTER — Encounter: Payer: Self-pay | Admitting: Family Medicine

## 2010-02-27 DIAGNOSIS — I6523 Occlusion and stenosis of bilateral carotid arteries: Secondary | ICD-10-CM

## 2010-02-27 DIAGNOSIS — I6529 Occlusion and stenosis of unspecified carotid artery: Secondary | ICD-10-CM

## 2010-02-27 MED ORDER — IOHEXOL 350 MG/ML SOLN
100.0000 mL | Freq: Once | INTRAVENOUS | Status: AC | PRN
  Administered 2010-02-27: 75 mL via INTRAVENOUS

## 2010-03-03 NOTE — Assessment & Plan Note (Unsigned)
OFFICE VISIT  [name], [name] DOB:  24-Jun-1923                                       02/27/2010 CHART#:[id number]  The patient returns today for follow-up of his carotid disease.  He is a very pleasant 77 year old patient of Dr. Maisie Fus D. Stuckey's who has been following for carotid stenosis.  We have been having discrepancies with ultrasound readings and I recently decided to send him for CT angiogram to better evaluate his stenosis.  He continues to be without symptoms.  Specifically he denies numbness or weakness in either extremity.  No amaurosis fugax.  No slurring of speech.  His coronary disease is managed by Dr. [name]. Stuckey.  His blood pressure has been well controlled.  He continues to be a nonsmoker and his cholesterol is regulated with a statin.  REVIEW OF SYSTEMS:  Unchanged from 02/13/2010.  He does get chest pain, chest tightness and shortness of breath with exertion.  He suffers from reflux.  He has arthritis and decreased hearing,  PHYSICAL EXAMINATION:  Heart rate 74, blood pressure 139/85, temperature 98.1.  General:  Well appearing in no distress.  HEENT:  Within normal limits.  Lungs:  Clear bilaterally.  Cardiovascular:  Regular rate and rhythm without murmur.  No carotid bruits.  Neurological:  He is intact. Extremities:  Warm and well-perfused.  Skin:  Without rash.  DIAGNOSTIC STUDIES:  CT angiogram was performed today.  This reveals a left-sided stenosis of 80%.  There is a mild fold and narrowing 1.3 cm above the origin of the left internal carotid artery.  ASSESSMENT/PLAN:  Asymptomatic left carotid stenosis.  PLAN:  After a review of the patient's CT scan and after a lengthy discussion of the risks and benefits of operation versus continued medical management, we have elected to proceed with carotid endarterectomy.  I discussed the details of the procedure including the risks and benefits which include but are not  limited to the risk of death,  the risk of stroke, the risk of nerve injury, the risk of bleeding, the risk of cardiopulmonary complications.  All of the patient's questions were answered.  We have scheduled this operation for Friday 03/10/2010.  He is due to see Dr. Riley Kill on Tuesday the 03/07/2010.  As long as  Dr. Riley Kill gives [place] approval, we will proceed with his operation.  CT scan commented on a 74% stenosis on the right side.  I do not think this is accurate.  We have multiple ultrasounds showing that he has stenosis less than 30% on the right side.  I believe the arteries is just heavily calcified and therefore an accurate stenosis measuring cannot be obtained with CT scan.  The patient was found to have a small nodule on CT scan.  He will need a repeat chest CT in  1 year followed following the guidelines.  I discussed this with the patient.  They are aware of this.  I will defer to Dr. Karleen Hampshire, his primary care physician for future follow-up of this.    Jorge Ny, MD  VWB/MEDQ  D:  02/27/2010  T:  02/28/2010  Job:  [id number]  cc:   [name]. Riley Kill, MD, [facility]

## 2010-03-07 ENCOUNTER — Ambulatory Visit (INDEPENDENT_AMBULATORY_CARE_PROVIDER_SITE_OTHER): Payer: Medicare Other | Admitting: Cardiology

## 2010-03-07 ENCOUNTER — Encounter: Payer: Self-pay | Admitting: Cardiology

## 2010-03-07 ENCOUNTER — Other Ambulatory Visit (HOSPITAL_COMMUNITY): Payer: MEDICARE

## 2010-03-07 DIAGNOSIS — I209 Angina pectoris, unspecified: Secondary | ICD-10-CM

## 2010-03-07 DIAGNOSIS — I359 Nonrheumatic aortic valve disorder, unspecified: Secondary | ICD-10-CM

## 2010-03-07 DIAGNOSIS — I6529 Occlusion and stenosis of unspecified carotid artery: Secondary | ICD-10-CM

## 2010-03-07 DIAGNOSIS — I2510 Atherosclerotic heart disease of native coronary artery without angina pectoris: Secondary | ICD-10-CM

## 2010-03-07 DIAGNOSIS — I739 Peripheral vascular disease, unspecified: Secondary | ICD-10-CM | POA: Insufficient documentation

## 2010-03-07 HISTORY — DX: Occlusion and stenosis of unspecified carotid artery: I65.29

## 2010-03-08 ENCOUNTER — Other Ambulatory Visit (HOSPITAL_COMMUNITY): Payer: MEDICARE

## 2010-03-10 ENCOUNTER — Inpatient Hospital Stay (HOSPITAL_COMMUNITY): Admission: RE | Admit: 2010-03-10 | Payer: Medicare Other | Source: Ambulatory Visit | Admitting: Surgery

## 2010-03-13 ENCOUNTER — Encounter: Payer: Self-pay | Admitting: Family Medicine

## 2010-03-13 ENCOUNTER — Other Ambulatory Visit: Payer: Self-pay | Admitting: Family Medicine

## 2010-03-13 ENCOUNTER — Ambulatory Visit (INDEPENDENT_AMBULATORY_CARE_PROVIDER_SITE_OTHER): Payer: Medicare Other | Admitting: Family Medicine

## 2010-03-13 DIAGNOSIS — Z79899 Other long term (current) drug therapy: Secondary | ICD-10-CM

## 2010-03-13 DIAGNOSIS — E785 Hyperlipidemia, unspecified: Secondary | ICD-10-CM

## 2010-03-13 DIAGNOSIS — E7800 Pure hypercholesterolemia, unspecified: Secondary | ICD-10-CM

## 2010-03-13 DIAGNOSIS — E041 Nontoxic single thyroid nodule: Secondary | ICD-10-CM

## 2010-03-13 DIAGNOSIS — I6529 Occlusion and stenosis of unspecified carotid artery: Secondary | ICD-10-CM

## 2010-03-13 DIAGNOSIS — I25810 Atherosclerosis of coronary artery bypass graft(s) without angina pectoris: Secondary | ICD-10-CM

## 2010-03-13 LAB — BASIC METABOLIC PANEL
BUN: 19 mg/dL (ref 6–23)
Creatinine, Ser: 1.3 mg/dL (ref 0.4–1.5)
GFR: 55.76 mL/min — ABNORMAL LOW (ref >60.00)

## 2010-03-13 LAB — TSH: TSH: 1.12 u[IU]/mL (ref 0.35–5.50)

## 2010-03-14 LAB — CONVERTED CEMR LAB: T4, Total: 6.9 ug/dL (ref 5.0–12.5)

## 2010-03-15 ENCOUNTER — Ambulatory Visit
Admission: RE | Admit: 2010-03-15 | Discharge: 2010-03-15 | Disposition: A | Discharge status: Home / Self Care | Payer: Medicare Other | Source: Ambulatory Visit | Attending: Family Medicine | Admitting: Family Medicine

## 2010-03-15 DIAGNOSIS — E041 Nontoxic single thyroid nodule: Secondary | ICD-10-CM

## 2010-03-16 ENCOUNTER — Encounter: Payer: Self-pay | Admitting: Family Medicine

## 2010-03-21 NOTE — Assessment & Plan Note (Signed)
Summary: F/U PER DR STUCKEY/DISCUSS CT RESULTS/CLE  MEDICARE ADVANTAGE   Vital Signs:  Patient profile:   77 year old male Height:      70 inches Weight:      197.25 pounds BMI:     28.40 Temp:     98.3 degrees F oral Pulse rate:   72 / minute Pulse rhythm:   regular BP sitting:   140 / 84  (left arm) Cuff size:   large  Vitals Entered By: Benny Lennert CMA Duncan Dull) (March 13, 2010 2:06 PM)  Primary Provider:  Hannah Beat, MD   History of Present Illness: Chief complaint follow up dr Tedra Senegal to discuss CT scan results  77 year old male:  f/u CT scan, multiple med probs  lung nodule - prior smoker, but none in 30-40 years.   aortic arch aneurysm  thyroid nodule, seen on CTA. no history of thyroid disease, recent TSH normal.  carotid stenosis, 80% lesion  See scanned CT report, which was reviewed in the office with patient.   Pt. has carotid disease that has been medically managed maximally with some interval progression on u/s, then ultimately had a vascular consult and CTA of neck. Dr. Myra Gianotti had recommended surgical intervention. At OV for cardiac clearance, given co-morbidities, particularly cardiac with a non-critical lesion, rec. continued medical management.   needs f/u BMP post-dye for CTA  Lipids, on pravachol without recent check    Allergies (verified): No Known Drug Allergies  Past History:  Past medical, surgical, family and social histories (including risk factors) reviewed, and no changes noted (except as noted below).  Past Medical History: CAD, ARTERY BYPASS GRAFT (ICD-414.04)- bypass graft surgery with recurrent disease in class ll-lll angina ANGINA PECTORIS, CLASS II (ICD-413.9) AORTIC STENOSIS (ICD-424.1) Prostate Cancer, 2002, treated with seeds Carotid stenosis, 80% HYPERCHOLESTEROLEMIA (ICD-272.0) RENAL DISEASE, CHRONIC, MILD (ICD-585.2) GASTROESOPHAGEAL REFLUX DISEASE (ICD-530.81) CHICKENPOX, HX OF (ICD-V15.9) Skin  Cancer Colonic polyps (destroyed in 1999 and 2004) Allergic rhinitis  Past Surgical History: bypass graft surgery. Transperineal implantation of palladium-103 seeds in the prostate, 2002 Cholecystectomy. Knee and shoulder surgery. Acoustic Neuroma, 1985 cataract, 2010 Tonsils and adenoids, 1932  02/2010. 1.2 mm pulm nodule, +/- f/u in 1 year  Family History: Reviewed history from 10/01/2008 and no changes required. Positive for emphysema in his brother. Family History of Alcoholism/Addiction Family History of Arthritis Family History of Cardiovascular disorder Mac degeneration Family History of Colon Cancer: sister Family History of Heart Disease: father, brother  Social History: Reviewed history from 10/01/2008 and no changes required. He quit smoking in 1963. He is retired from [employer] and [employer] Married Alcohol Use - yes one glass at night Daily Caffeine Use one cup coffee at night Illicit Drug Use - no  Review of Systems       generally is feeling well. still has shortness of breath with exertion. no cp at baseline. no fevers, chills, weight loss.  Physical Exam  General:  Well developed, well nourished, in no acute distress. Elderly Head:  Normocephalic and atraumatic without obvious abnormalities. No apparent alopecia or balding. Ears:  no external deformities.   Nose:  no external deformity.   Mouth:  MMM Lungs:  Normal respiratory effort, chest expands symmetrically. Lungs are clear to auscultation, no crackles or wheezes. Heart:  2/6 SEM. no rubs or gallups.  Extremities:  tr LE edema Psych:  Cognition and judgment appear intact. Alert and cooperative with normal attention span and concentration. No apparent delusions, illusions, hallucinations  Impression & Recommendations:  Problem # 1:  THYROID NODULE (ICD-241.0) Assessment New new thyroid nodule, at this point tsh, free t4 and t3 are all normal. check thyroid u/s - if normal, would not pursue  further.  Orders: T-T4, Thyroxine; Total [phone]) T-T3, Free [phone]) Radiology Referral (Radiology) TLB-TSH (Thyroid Stimulating Hormone) [phone])  Problem # 2:  CAROTID ARTERY STENOSIS (ICD-433.10) Reviewed Dr. Rosalyn Charters notes, and I am in agreement that medical management seems the most prudent course of action in this case. Discussed with he and his wife, and they seem to understand that there are risks to surgery given his overall health. The risk of CVA is there and they understand that. Completely asymptomatic.  His updated medication list for this problem includes:    Aspirin Ec 325 Mg Tbec (Aspirin) .Marland Kitchen... Take one tablet by mouth daily  Problem # 3:  HYPERCHOLESTEROLEMIA (ICD-272.0)  His updated medication list for this problem includes:    Pravastatin Sodium 40 Mg Tabs (Pravastatin sodium) .Marland Kitchen... Take 1 tablet by mouth once a day  Orders: TLB-Cholesterol, Direct LDL (83721-DIRLDL)  Problem # 4:  CAD, ARTERY BYPASS GRAFT (ICD-414.04) continues with some exertional angina.  His updated medication list for this problem includes:    Imdur 60 Mg Xr24h-tab (Isosorbide mononitrate) .Marland Kitchen... Take 1 tablet by mouth once a day    Aspirin Ec 325 Mg Tbec (Aspirin) .Marland Kitchen... Take one tablet by mouth daily    Nitrolingual 0.4 Mg/spray Soln (Nitroglycerin) .Marland Kitchen... As needed  Complete Medication List: 1)  Prilosec Otc 20 Mg Tbec (Omeprazole magnesium) .... Take 1 tablet by mouth once a day 2)  Pravastatin Sodium 40 Mg Tabs (Pravastatin sodium) .... Take 1 tablet by mouth once a day 3)  Eye Vitamins Caps (Multiple vitamins-minerals) .... Take 1 capsule by mouth two times a day 4)  Lutein 6 Mg Caps (Lutein) .... Take 1 capsule by mouth once a day 5)  Nephro-vite Rx 1 Mg Tabs (B complex-c-folic acid) .... Take 1 tablet by mouth once a day 6)  Imdur 60 Mg Xr24h-tab (Isosorbide mononitrate) .... Take 1 tablet by mouth once a day 7)  Aspirin Ec 325 Mg Tbec (Aspirin) .... Take one tablet  by mouth daily 8)  Nitrolingual 0.4 Mg/spray Soln (Nitroglycerin) .... As needed 9)  Vitamin A & D 8000-400 Unit Caps (Vitamins a & d) .... Take 1 tablet by mouth once a day  Other Orders: Venipuncture (86578) TLB-BMP (Basic Metabolic Panel-BMET) (80048-METABOL)  Patient Instructions: 1)  f/u 6 months 2)  Referral Appointment Information 3)  Day/Date: 4)  Time: 5)  Place/MD: 6)  Address: 7)  Phone/Fax: 8)  Patient given appointment information. Information/Orders faxed/mailed.    Orders Added: 1)  Venipuncture [46962] 2)  T-T4, Thyroxine; Total [84436-23265] 3)  T-T3, Free [[id number]] 4)  Radiology Referral [Radiology] 5)  TLB-BMP (Basic Metabolic Panel-BMET) [80048-METABOL] 6)  TLB-TSH (Thyroid Stimulating Hormone) [84443-TSH] 7)  TLB-Cholesterol, Direct LDL [83721-DIRLDL] 8)  Est. Patient Level IV [01027]    Current Allergies (reviewed today): No known allergies

## 2010-03-21 NOTE — Progress Notes (Signed)
Summary: [facility] [place]: Office Visit  [facility] [place]: Office Visit   Imported By: Earl Many 03/01/2010 09:26:51  _____________________________________________________________________  External Attachment:    Type:   Image     Comment:   External Document  Appended Document: [facility] : Office Visit discussed this case at length with Dr. Myra Gianotti.  TS

## 2010-03-21 NOTE — Assessment & Plan Note (Signed)
Summary: f31m/ from 09/09/09 OV/lwb per pt wife-mj/lwb   Visit Type:  6 months follow up Referring Provider:  Hannah Beat, MD Primary Provider:  Hannah Beat, MD  CC:  Sob.  History of Present Illness: Mr. Livsey is in for followup.  He was scheduled for surgery later in the week for his carotid, but his daughter was uneasy with this, and Dr. Myra Gianotti and I both discussed this in detail, and agreed to watch for now with repeat doppler studies.  To summarize, there is some progression, but he is asymptomatic on good medical therapy, and of note has prior CABG with fairly low threshold angina, prior episode of atheroemboli during cardiac cath, and also AS which has been modestly progressive.  He is modestly liminted, but is alert, coherent, and able to do most things cognitively.  He is limited physically with class II-III symptoms.  He denies rest angina.    Problems Prior to Update: 1)  Benign Positional Vertigo  (ICD-386.11) 2)  Back Pain  (ICD-724.5) 3)  Colorectal Cancer, Family Hx  (ICD-V16.0) 4)  Allergic Rhinitis  (ICD-477.9) 5)  Colonic Polyps, Hx of  (ICD-V12.72) 6)  Chickenpox, Hx of  (ICD-V15.9) 7)  Cad, Artery Bypass Graft  (ICD-414.04) 8)  Angina Pectoris, Class II  (ICD-413.9) 9)  Aortic Stenosis  (ICD-424.1) 10)  Hypercholesterolemia  (ICD-272.0) 11)  Renal Disease, Chronic, Mild  (ICD-585.2) 12)  Gastroesophageal Reflux Disease  (ICD-530.81)  Current Medications (verified): 1)  Prilosec Otc 20 Mg Tbec (Omeprazole Magnesium) .... Take 1 Tablet By Mouth Once A Day 2)  Pravastatin Sodium 40 Mg Tabs (Pravastatin Sodium) .... Take 1 Tablet By Mouth Once A Day 3)  Eye Vitamins  Caps (Multiple Vitamins-Minerals) .... Take 1 Capsule By Mouth Two Times A Day 4)  Lutein 6 Mg Caps (Lutein) .... Take 1 Capsule By Mouth Once A Day 5)  Nephro-Vite Rx 1 Mg Tabs (B Complex-C-Folic Acid) .... Take 1 Tablet By Mouth Once A Day 6)  Imdur 60 Mg Xr24h-Tab (Isosorbide Mononitrate) ....  Take 1 Tablet By Mouth Once A Day 7)  Aspirin Ec 325 Mg Tbec (Aspirin) .... Take One Tablet By Mouth Daily 8)  Nitrolingual 0.4 Mg/spray Soln (Nitroglycerin) .... As Needed 9)  Vitamin D 400 Unit Tabs (Cholecalciferol) .... Take 1 Tablet By Mouth Once A Day  Allergies (verified): No Known Drug Allergies  Past History:  Past Medical History: Last updated: 10/01/2008 CAD, ARTERY BYPASS GRAFT (ICD-414.04)- bypass graft surgery with recurrent disease in class ll-lll angina ANGINA PECTORIS, CLASS II (ICD-413.9) AORTIC STENOSIS (ICD-424.1) Prostate Cancer, 2002, treated with seeds HYPERCHOLESTEROLEMIA (ICD-272.0) RENAL DISEASE, CHRONIC, MILD (ICD-585.2) GASTROESOPHAGEAL REFLUX DISEASE (ICD-530.81) CHICKENPOX, HX OF (ICD-V15.9) Skin Cancer Colonic polyps (destroyed in 1999 and 2004) Allergic rhinitis  Past Surgical History: Last updated: 09/01/2008 bypass graft surgery. Transperineal implantation of palladium-103 seeds in the prostate, 2002 Cholecystectomy. Knee and shoulder surgery. Acoustic Neuroma, 1985 cataract, 2010 Tonsils and adenoids, 1932  Family History: Last updated: 10/01/2008 Positive for emphysema in his brother. Family History of Alcoholism/Addiction Family History of Arthritis Family History of Cardiovascular disorder Mac degeneration Family History of Colon Cancer: sister Family History of Heart Disease: father, brother  Social History: Last updated: 10/01/2008 He quit smoking in 1963. He is retired from [employer] and [employer] Married Alcohol Use - yes one glass at night Daily Caffeine Use one cup coffee at night Illicit Drug Use - no  Vital Signs:  Patient profile:   77 year old male Height:  70 inches Weight:      194 pounds BMI:     27.94 Pulse rate:   64 / minute Pulse rhythm:   regular Resp:     18 per minute BP sitting:   145 / 85  (left arm) Cuff size:   large  Vitals Entered By: Vikki Ports (March 07, 2010 4:12 PM)  Physical  Exam  General:  Well developed, well nourished, in no acute distress. Head:  normocephalic and atraumatic Eyes:  PERRLA/EOM intact; conjunctiva and lids normal. Neck:  bilat carotid bruits.  Lungs:  Clear bilaterally to auscultation and percussion. Heart:  PMI non displaced. Normal S1 and S2.  SEM 2-3/6 with mid to late peak.  No diastolic murmur.  Pulses:  pulses normal in all 4 extremities Extremities:  No clubbing or cyanosis. Neurologic:  Alert and oriented x 3.   EKG  Procedure date:  03/07/2010  Findings:      NSR.  WNL.    Echocardiogram  Procedure date:  05/06/2009  Findings:      Study Conclusions            - Left ventricle: The cavity size was mildly dilated. Wall thickness       was increased in a pattern of mild LVH. Systolic function was       normal. The estimated ejection fraction was in the range of 50% to       55%.     - Aortic valve: Fusion of the right and left coronary cusps There       was mild stenosis. Mild regurgitation. Valve area: 1.22cm 2(VTI).       Valve area: 1.16cm 2 (Vmax).     - Mitral valve: Mild regurgitation.     - Left atrium: The atrium was mildly dilated.     - Atrial septum: No defect or patent foramen ovale was identified.  Impression & Recommendations:  Problem # 1:  CAD, ARTERY BYPASS GRAFT (ICD-414.04) Still has class III symptoms, but controlled.  No rest pain.  Continue medical management.  His updated medication list for this problem includes:    Imdur 60 Mg Xr24h-tab (Isosorbide mononitrate) .Marland Kitchen... Take 1 tablet by mouth once a day    Aspirin Ec 325 Mg Tbec (Aspirin) .Marland Kitchen... Take one tablet by mouth daily    Nitrolingual 0.4 Mg/spray Soln (Nitroglycerin) .Marland Kitchen... As needed  Orders: EKG w/ Interpretation (93000) Echocardiogram (Echo)  Problem # 2:  AORTIC STENOSIS (ICD-424.1) moderately severe.  Would like to avoid AVR or TAVR procedure if possible.   His updated medication list for this problem includes:    Imdur 60 Mg  Xr24h-tab (Isosorbide mononitrate) .Marland Kitchen... Take 1 tablet by mouth once a day    Nitrolingual 0.4 Mg/spray Soln (Nitroglycerin) .Marland Kitchen... As needed  Problem # 3:  HYPERCHOLESTEROLEMIA (ICD-272.0) Continue medical therapy.  This has been previously discussed.  His updated medication list for this problem includes:    Pravastatin Sodium 40 Mg Tabs (Pravastatin sodium) .Marland Kitchen... Take 1 tablet by mouth once a day  Problem # 4:  CAROTID ARTERY STENOSIS (ICD-433.10) Dr. Myra Gianotti and I discussed at length.  Despite some progression, lesion is not critical.  Of note, he is asymptomatic, and 77 years old.  Given prior history of atheroemboli with cath, progressive AS, and frequent angina, favor continued medical therapy.  We agreed he would have followup studies.  Patient, his wife, and according to them his daughter, all are more comfortable with this.  His updated medication  list for this problem includes:    Aspirin Ec 325 Mg Tbec (Aspirin) .Marland Kitchen... Take one tablet by mouth daily  Patient Instructions: 1)  Your physician recommends that you schedule a follow-up appointment in: 2 months with Dr. Riley Kill 2)  Your physician has requested that you have an echocardiogram.  Echocardiography is a painless test that uses sound waves to create images of your heart. It provides your doctor with information about the size and shape of your heart and how well your heart's chambers and valves are working.  This procedure takes approximately one hour. There are no restrictions for this procedure.

## 2010-04-04 NOTE — Letter (Signed)
Summary: Andrew [name]   Imported By: Kassie Mends 03/24/2010 09:25:13  _____________________________________________________________________  External Attachment:    Type:   Image     Comment:   External Document

## 2010-04-11 NOTE — Progress Notes (Signed)
Summary: [facility]: Office Visit  [facility]: Office Visit   Imported By: Earl Many 03/16/2010 13:51:23  _____________________________________________________________________  External Attachment:    Type:   Image     Comment:   External Document

## 2010-04-19 ENCOUNTER — Other Ambulatory Visit: Payer: Self-pay | Admitting: Endocrinology

## 2010-04-19 DIAGNOSIS — E041 Nontoxic single thyroid nodule: Secondary | ICD-10-CM

## 2010-05-03 ENCOUNTER — Ambulatory Visit
Admission: RE | Admit: 2010-05-03 | Discharge: 2010-05-03 | Disposition: A | Discharge status: Home / Self Care | Payer: Medicare Other | Source: Ambulatory Visit | Attending: Endocrinology | Admitting: Endocrinology

## 2010-05-03 ENCOUNTER — Other Ambulatory Visit (HOSPITAL_COMMUNITY)
Admission: RE | Admit: 2010-05-03 | Discharge: 2010-05-03 | Disposition: A | Discharge status: Home / Self Care | Payer: MEDICARE | Source: Ambulatory Visit | Attending: Diagnostic Radiology | Admitting: Diagnostic Radiology

## 2010-05-03 ENCOUNTER — Other Ambulatory Visit: Payer: Self-pay | Admitting: Diagnostic Radiology

## 2010-05-03 DIAGNOSIS — E041 Nontoxic single thyroid nodule: Secondary | ICD-10-CM

## 2010-05-03 DIAGNOSIS — E049 Nontoxic goiter, unspecified: Secondary | ICD-10-CM | POA: Insufficient documentation

## 2010-05-08 ENCOUNTER — Other Ambulatory Visit (HOSPITAL_COMMUNITY): Payer: Self-pay | Admitting: Cardiology

## 2010-05-08 DIAGNOSIS — I350 Nonrheumatic aortic (valve) stenosis: Secondary | ICD-10-CM

## 2010-05-08 DIAGNOSIS — I2510 Atherosclerotic heart disease of native coronary artery without angina pectoris: Secondary | ICD-10-CM

## 2010-05-09 ENCOUNTER — Encounter: Payer: Self-pay | Admitting: Cardiology

## 2010-05-10 ENCOUNTER — Ambulatory Visit (HOSPITAL_COMMUNITY): Payer: MEDICARE | Attending: Cardiology | Admitting: Radiology

## 2010-05-10 ENCOUNTER — Ambulatory Visit (INDEPENDENT_AMBULATORY_CARE_PROVIDER_SITE_OTHER): Payer: MEDICARE | Admitting: Cardiology

## 2010-05-10 ENCOUNTER — Encounter: Payer: Self-pay | Admitting: Cardiology

## 2010-05-10 ENCOUNTER — Other Ambulatory Visit: Payer: Self-pay | Admitting: Family Medicine

## 2010-05-10 ENCOUNTER — Ambulatory Visit: Payer: Medicare Other | Admitting: Cardiology

## 2010-05-10 VITALS — Ht 70.0 in | Wt 193.0 lb

## 2010-05-10 DIAGNOSIS — I080 Rheumatic disorders of both mitral and aortic valves: Secondary | ICD-10-CM | POA: Insufficient documentation

## 2010-05-10 DIAGNOSIS — I209 Angina pectoris, unspecified: Secondary | ICD-10-CM

## 2010-05-10 DIAGNOSIS — I6529 Occlusion and stenosis of unspecified carotid artery: Secondary | ICD-10-CM | POA: Insufficient documentation

## 2010-05-10 DIAGNOSIS — E7800 Pure hypercholesterolemia, unspecified: Secondary | ICD-10-CM | POA: Insufficient documentation

## 2010-05-10 DIAGNOSIS — I079 Rheumatic tricuspid valve disease, unspecified: Secondary | ICD-10-CM | POA: Insufficient documentation

## 2010-05-10 DIAGNOSIS — I359 Nonrheumatic aortic valve disorder, unspecified: Secondary | ICD-10-CM

## 2010-05-10 DIAGNOSIS — E041 Nontoxic single thyroid nodule: Secondary | ICD-10-CM

## 2010-05-10 DIAGNOSIS — I2510 Atherosclerotic heart disease of native coronary artery without angina pectoris: Secondary | ICD-10-CM

## 2010-05-10 DIAGNOSIS — I350 Nonrheumatic aortic (valve) stenosis: Secondary | ICD-10-CM

## 2010-05-10 NOTE — Patient Instructions (Signed)
Referral to Dr. Nat Christen surgical consult for possible thyroid nodule removal   [phone] Your physician recommends that you schedule a follow-up appointment in: 3 months with Dr.Stuckey

## 2010-05-10 NOTE — Progress Notes (Signed)
Reviewed pathology report, 05/03/2010 ordered by Dr. Talmage Nap, endocrine, who recommends surgical consultation based on report. I do not have the consult note at this point.  Dr. Riley Kill and I discussed this case and patient on the phone and we both thought it reasonable to have Dr. Darnell Level discuss the case and pathology report with Mr. And Mrs. Lefevre.

## 2010-05-15 ENCOUNTER — Encounter: Payer: Self-pay | Admitting: Family Medicine

## 2010-05-17 NOTE — Assessment & Plan Note (Signed)
Tolerates prava, and with carotid and cardiac disease, favor continued use.

## 2010-05-17 NOTE — Progress Notes (Signed)
HPI:  Apparently went to see the endocrinologist, who, according to Mr. Flynt, said Dr. Riley Kill would decide what to do.  There was discussion about referral to a surgeon.  The path results were reviewed today.  The patient is unchanged.  He cannot be very active, but continues to remain about the same.  His thyroid was thought to be abnormal on the biopsy.  They have seen Dr. Gerrit Friends, and Mrs. Savant is very familiar with him.  We discussed the possibility of cutting down the dose of isosorbide mononitrate with him.    Current Outpatient Prescriptions  Medication Sig Dispense Refill  . aspirin EC 325 MG EC tablet Take 325 mg by mouth daily.        . isosorbide mononitrate (IMDUR) 60 MG 24 hr tablet Take 60 mg by mouth daily.        . Lutein 6 MG CAPS Take by mouth daily.        . nitroGLYCERIN (NITROLINGUAL) 0.4 MG/SPRAY spray Place 1 spray under the tongue every 5 (five) minutes as needed.        Marland Kitchen omeprazole (PRILOSEC) 20 MG capsule Take 20 mg by mouth daily.        . pravastatin (PRAVACHOL) 40 MG tablet Take 40 mg by mouth daily.          Allergies not on file  Past Medical History  Diagnosis Date  . HYPERCHOLESTEROLEMIA 06/18/2008  . CAD, ARTERY BYPASS GRAFT 06/18/2008  . CAROTID ARTERY STENOSIS 03/07/2010  . AORTIC STENOSIS 06/18/2008  . GASTROESOPHAGEAL REFLUX DISEASE 06/18/2008  . History of chicken pox   . Skin cancer   . History of colonic polyps   . Allergic rhinitis     Past Surgical History  Procedure Date  . Bypass graft   . Transperineal implatation of palladium   . Cholecystectomy   . Shoulder surgery   . Total knee arthroplasty   . Cataract extraction   . Tonsillectomy and adenoidectomy     Family History  Problem Relation Age of Onset  . Emphysema    . Alcohol abuse    . Arthritis    . Heart disease    . Cancer      History   Social History  . Marital Status: Married    Spouse Name: N/A    Number of Children: N/A  . Years of Education: N/A    Occupational History  . retired     Social History Main Topics  . Smoking status: Former Smoker    Quit date: 01/22/1961  . Smokeless tobacco: Not on file  . Alcohol Use: 0.5 oz/week    1 drink(s) per week  . Drug Use: No  . Sexually Active: Not on file   Other Topics Concern  . Not on file   Social History Narrative  . No narrative on file    ROS: Please see the HPI.  All other systems reviewed and negative.  PHYSICAL EXAM:  Ht 5\' 10"  (1.778 m)  Wt 193 lb (87.544 kg)  BMI 27.69 kg/m2  General: Well developed, well nourished, in no acute distress. Head:  Normocephalic and atraumatic. Neck: no JVD Lungs: Clear to auscultation and percussion. Heart: Normal S1 and S2.  SEM consistent with AS.   Abdomen:  Normal bowel sounds; soft; non tender; no organomegaly Pulses: Pulses normal in all 4 extremities. Extremities: No clubbing or cyanosis. No edema. Neurologic: Alert and oriented x 3.  EKG:  NSR.  WNL.   Echo:Study  Conclusions    - Left ventricle: Septal and inferobasal hypokinesis The cavity size     was mildly dilated. There was moderate concentric hypertrophy.     Systolic function was mildly reduced. The estimated ejection     fraction was in the range of 45% to 50%.   - Aortic valve: Fusion of right and left cusps There was mild     stenosis.   - Mitral valve: Mild regurgitation.   - Left atrium: The atrium was mildly dilated.   - Atrial septum: No defect or patent foramen ovale was identified.   ASSESSMENT AND PLAN:

## 2010-05-17 NOTE — Assessment & Plan Note (Signed)
See path in [place].  Not in EPIC at present. Will suggest referral to Dr. Gerrit Friends who the family knows.

## 2010-05-17 NOTE — Assessment & Plan Note (Signed)
The patient has class II angina.  He is post bypass.  Continue on medical therapy.   May be able to reduce dose of isosorbide mononitrate.

## 2010-05-17 NOTE — Assessment & Plan Note (Signed)
Previously discussed with Dr. Myra Gianotti.  Will continue medical therapy at the present time.

## 2010-05-17 NOTE — Assessment & Plan Note (Signed)
See details of echo report.  Not in need of intervention.

## 2010-05-23 DIAGNOSIS — E041 Nontoxic single thyroid nodule: Secondary | ICD-10-CM

## 2010-05-23 HISTORY — DX: Nontoxic single thyroid nodule: E04.1

## 2010-06-05 ENCOUNTER — Other Ambulatory Visit: Payer: Medicare Other

## 2010-06-05 ENCOUNTER — Ambulatory Visit: Payer: Medicare Other | Admitting: Surgery

## 2010-06-06 NOTE — Assessment & Plan Note (Signed)
[facility]                            CARDIOLOGY OFFICE NOTE   NAME:[name], [name]                        MRN:          [id number]  DATE:01/02/2007                            DOB:          21-Feb-1923    [name] is in for a followup visit.  He has had one episode of chest  pain that occurred at rest, he said it felt like a 50 pounds weight  sitting there.  He took nitroglycerin and it gradually cleared up.  There was about a 5 minute period.  He did have some chest congestion  for a few weeks, and took a dose of antibiotics for 5 days which he  finished last weekend.  He is now somewhat better.   MEDICATIONS:  1. Prilosec 20 mg daily.  2. Pravastatin 40 mg daily.  3. Aspirin 81 mg daily.  4. PreserVision 2 a day.  5. Lutein 6 mg daily.  6. Nephrovite 1 daily.  7. Imdur 60 mg daily.  8. Vitamin D 50,000 IU monthly.   PHYSICAL:  He is alert and oriented, no acute distress.  Blood pressure  is 134/74, the pulse is 59.  LUNGS:  Fields are quite clear.  There is a systolic ejection murmur consistent with mild aortic valve  stenosis and no diastolic murmurs are appreciated.  EXTREMITIES:  Unchanged.   The patient had a duplex study today, this revealed stable bilateral  carotid disease with 40% - 59% RICA stenosis and a 60% - 79% LICA  stenosis.   [name] had an episode of chest pain in an inflammatory situation.  His  symptoms have stabilized.  If he would have increasing frequency or more  rest symptoms he might require catheterization but it was thought  previously that he had atheroembolization.  He has a modest renal  insufficiency as associated with this.  Our goal has been to try to  treat medically as well as possible given the patient's age and previous  findings.  He will have a followup Doppler of his neck in 6 months.  I  will continue to followup closely.     [name]. Riley Kill, MD, [facility]  Electronically Signed    TDS/MedQ   DD: 01/07/2007  DT: 01/07/2007  Job #: [id number]

## 2010-06-06 NOTE — Assessment & Plan Note (Signed)
[facility]                            CARDIOLOGY OFFICE NOTE   NAME:Andrew [name], NED KAKAR                        MRN:          [id number]  DATE:08/28/2007                            DOB:          04-28-1923    Mr. Shammas is in for followup visit.  They have talked about the issue of  doing set a surgical procedure on a shoulder.  He has as of yet  uncertain about all of this.  He and I have had a pretty long  discussion.  The patient has advanced coronary disease.  We have not  cath'd him as he had a probable embolic event with previous  catheterization.  He denies any ongoing chest pain that has progressed  from the previous visits, and he has a fairly stable class II type of  angina.  He has also had recent Doppler studies.  He went to see Dr.  Durene Cal, and they carefully analyzed his carotids.  He and I  discussed this in some detail, and about the variations in the findings.  We did explained how this would all occur.   MEDICATIONS:  1. Prilosec 20 mg daily.  2. Pravastatin 40 mg daily.  3. PreserVision 2 daily.  4. Lutein 6 mg daily.  5. Nephro-Vite daily.  6. Imdur 60 mg daily.  7. Vitamin D.  8. Aspirin 325 mg daily.   PHYSICAL EXAMINATION:  GENERAL:  He is alert and oriented.  VITAL SIGNS:  Blood pressure is 126/74.  The pulse is 60.  LUNG:  Fields are clear.  CARDIAC:  Rhythm is regular.  He does have a systolic ejection murmur  compatible with mild aortic stenosis.   Mr. Alvizo is stable.  He potentially could undergo surgery on his  shoulder, but given his age and underlying disease, and overall general  medical status, I do have some obvious concerns and I think that he  would want to be pretty hard with regard a necessity to have this done.  We can continue to have an ongoing dialogue about this.  When I see him  back in followup in 6 months, I would likely want to get a repeat 2-D  echocardiogram on him.      [name]. Riley Kill,  MD, [facility]  Electronically Signed    TDS/MedQ  DD: 09/06/2007  DT: 09/07/2007  Job #: [id number]

## 2010-06-06 NOTE — Procedures (Signed)
CAROTID DUPLEX EXAM   INDICATION:  Carotid disease.   HISTORY:  Diabetes:  Yes.  Cardiac:  Yes.  Hypertension:  Yes.  Smoking:  Yes.  Previous Surgery:  No.  CV History:  Currently asymptomatic.  Amaurosis Fugax No, Paresthesias No, Hemiparesis No.                                       RIGHT             LEFT  Brachial systolic pressure:         134               130  Brachial Doppler waveforms:         Normal            Normal  Vertebral direction of flow:        Antegrade         Antegrade  DUPLEX VELOCITIES (cm/sec)  CCA peak systolic                   67                78  ECA peak systolic                   88                63  ICA peak systolic                   65                232  ICA end diastolic                   22                70  PLAQUE MORPHOLOGY:                  Mixed             Mixed  PLAQUE AMOUNT:                      Mild/moderate     Moderate/severe  PLAQUE LOCATION:                    ICA/ECA           ICA/ECA   IMPRESSION:  1. 1% to 39% stenosis of the right internal carotid artery.  2. 60% to 79% stenosis of the left internal carotid artery.  3. The right internal carotid artery velocities are less than      previously recorded on 12/13/2008 with the left internal carotid      artery remaining stable.   ___________________________________________  V. Charlena Cross, MD   CH/MEDQ  D:  05/23/2009  T:  05/24/2009  Job:  [id number]

## 2010-06-06 NOTE — Assessment & Plan Note (Signed)
[facility]                            CARDIOLOGY OFFICE NOTE   NAME:Andrew [name], Andrew [name]                        MRN:          [id number]  DATE:12/11/2007                            DOB:          Nov 29, 1923    Mr. Stepka is in for followup.  In general, he is stable.  He has fairly  predictable class II-III angina, and it is almost reproducible with most  activities.  However, he has remained stable and has not had significant  rest discomfort.  He takes a nitroglycerin, and it goes away.   MEDICATIONS:  1. Prilosec 20 mg daily.  2. Pravastatin 40 mg daily.  3. PreserVision 2 daily.  4. Lutein 6 mg daily.  5. Nephro-Vite 1 daily.  6. Imdur 60 mg daily.  7. Vitamin D international units daily.  8. Aspirin 325 mg daily.   PHYSICAL EXAMINATION:  The blood pressure is 114/70, pulse 64.  The lung  fields are clear, and the cardiac rhythm is regular.   He has had a recent carotid study done with Dr. Myra Gianotti.  This has  revealed a 60-79% stenosis of the left internal carotid with 0-39% on  the right internal.  There is no significant interval change compared to  Jun 09, 2007.   The electrocardiogram today demonstrates normal sinus rhythm and within  normal limits.   IMPRESSION:  1. Coronary artery disease status post coronary artery bypass graft      surgery.  2. Stable exertional angina with class III symptoms.  3. Moderately high-grade carotid disease with continuous followup.  4. Hypercholesterolemia on lipid-lowering therapy.  5. History of mild chronic renal insufficiency with a history of      atheroembolic event following cardiac catheterization.   PLAN:  1. Return to clinic in 3-4 months.  2. Continue current medical regimen with discussion of potential      options down the road.   ADDENDUM:  The patient does have a systolic ejection murmur with a  mildly-to-moderately calcified aortic valve and moderately reduced  aortic leaflet motion,  mean AV gradient was 6 mm in October 2007.  We  will repeat his echo at his next office visit.     [name]. Riley Kill, MD, [facility]  Electronically Signed    TDS/MedQ  DD: 12/11/2007  DT: 12/12/2007  Job #: [id number]

## 2010-06-06 NOTE — Assessment & Plan Note (Signed)
Andrew [name]                            CARDIOLOGY OFFICE NOTE   NAME:Andrew [name], Andrew [name]                        MRN:          [id number]  DATE:08/28/2007                            DOB:          Jul 11, 1923    [name] is in for a followup visit.  To summarize, the patient is  getting along reasonably well.  He has had recent evaluation by Dr.  Durene Cal IV at [facility].  The severity of his  carotid stenosis appeared to be less.  He has also seen Dr. Lavada Mesi and has problems with his shoulder.  They have talked about the  possibility of surgical intervention, which might require general  anesthesia.  In general, [name] and I have discussed this in some  detail.  The patient does not have significant aortic stenosis, he does  not have a heart failure.  He does have exertional angina and his MET  level is clearly less than four METS.  He has not had unstable symptoms.  Nonetheless, any type of inflammatory load given both his carotid and  his cardiac disease could result in an adverse outcome and given his age  and lack of loss of significant quality of life based on the current  symptoms, we have suggested the possibility that this be deferred at  present time.  The patient actually prefers this approach.   MEDICATIONS:  1. Prilosec 20 mg daily.  2. Pravastatin 40 mg daily.  3. PreserVision daily.  4. Lutein daily  5. Nephro-Vite daily.  6. Imdur 60 mg daily.  7. Aspirin 325 mg daily.   On physical, blood pressure is 126/74, the pulse is 60.  The lung fields  are clear.  The cardiac rhythm is regular without a significant murmur.   We reviewed in detail the differences between his 2 carotid studies.   Overall, this patient is doing well from a cardiac standpoint with class  II angina.  We will continue this at the present time.  He will remain  on medical therapy currently.  I will see him back in follow up in 3  months.  Should he have any change in his status, he is to call me.  I  will communicate the information to Dr. Prince Rome and the patient is  preferable for holding off at this time.     [name]. Riley Kill, MD, [facility]  Electronically Signed    TDS/MedQ  DD: 08/28/2007  DT: 08/29/2007  Job #: [id number]

## 2010-06-06 NOTE — Assessment & Plan Note (Signed)
[facility]                            CARDIOLOGY OFFICE NOTE   NAME:Hazelrigg, KIAAN OVERHOLSER                        MRN:          [id number]  DATE:04/02/2007                            DOB:          26-Jan-1923    Mr. Bowdoin is in for follow up.  He continues to have some intermittent  chest discomfort.  It is often associated with exertion.  He does notes  that seems to occur more with his arms.   There has been no severe rest pain.   PHYSICAL EXAMINATION:  VITAL SIGNS:  Blood pressure is 118/72, pulse 72.  LUNGS:  The lung fields are clear.  CARDIAC:  Rhythm is regular.   ELECTROCARDIOGRAM:  The electrocardiogram demonstrates sinus rhythm and  is within normal limits.   IMPRESSION:  1. Coronary artery disease status post coronary bypass graft surgery.  2. History of chronic stable angina, class II, III.  3. Hyperlipidemia on lipid-lowering therapy.  4. Mild chronic renal insufficiency with history of possible      catheterization-induced embolization.   PLAN:  1. Return to clinic in 3 months.  2. Continue current medical regimen.  3. I have encouraged him to get on the Aerodyne bike and try some      upper body exercises at a very light level.     [name]. Riley Kill, MD, [facility]  Electronically Signed    TDS/MedQ  DD: 04/02/2007  DT: 04/03/2007  Job #: [id number]

## 2010-06-06 NOTE — Procedures (Signed)
CAROTID DUPLEX EXAM   INDICATION:  Follow-up evaluation of known carotid artery disease.   HISTORY:  Diabetes:  Yes.  Cardiac:  Yes.  Hypertension:  Yes.  Smoking:  Yes.  Previous Surgery:  No.  CV History:  Patient reports no cerebrovascular symptoms at this time.  Previous duplex on 12/01/07 revealed 20-39% right ICA stenosis, 60-79%  left ICA stenosis.  Amaurosis Fugax No, Paresthesias No, Hemiparesis No.                                       RIGHT             LEFT  Brachial systolic pressure:         138               130  Brachial Doppler waveforms:         Triphasic         Triphasic  Vertebral direction of flow:        Antegrade         Antegrade  DUPLEX VELOCITIES (cm/sec)  CCA peak systolic                   61                56  ECA peak systolic                   92                96  ICA peak systolic                   101               217  ICA end diastolic                   24                80  PLAQUE MORPHOLOGY:                  Calcified         Mixed  PLAQUE AMOUNT:                      Moderate          Moderate  PLAQUE LOCATION:                    Proximal ICA      Distal CCA,  proximal ICA   IMPRESSION:  1. 20-39% right internal carotid artery stenosis.  2. 60-79% left internal carotid artery stenosis.  3. No significant change from previous study performed on 12/01/07.   ___________________________________________  V. Charlena Cross, MD   MC/MEDQ  D:  06/07/2008  T:  06/07/2008  Job:  [id number]

## 2010-06-06 NOTE — Procedures (Signed)
CAROTID DUPLEX EXAM   INDICATION:  Follow-up carotid artery disease.   HISTORY:  Diabetes:  Yes.  Cardiac:  Yes.  Hypertension:  Yes.  Smoking:  Yes.  Previous Surgery:  No.  CV History:  Asymptomatic.  Amaurosis Fugax No, Paresthesias No, Hemiparesis No.                                       RIGHT             LEFT  Brachial systolic pressure:         132               134  Brachial Doppler waveforms:         Normal            Normal  Vertebral direction of flow:        Antegrade         Antegrade  DUPLEX VELOCITIES (cm/sec)  CCA peak systolic                   81                81  ECA peak systolic                   102               90  ICA peak systolic                   76                257  ICA end diastolic                   27                83  PLAQUE MORPHOLOGY:                  Calcific          Calcific  PLAQUE AMOUNT:                      Mild/moderate     Moderate  PLAQUE LOCATION:                    ICA/bifurcation   ICA/bifurcation   IMPRESSION:  1. A 1-39% stenosis at the right internal carotid artery.  2. A 60-79% stenosis of the left internal carotid artery.  3. No significant change noted in the left internal carotid artery      when compared to previous exam on 06/09/2007.   ___________________________________________  V. Charlena Cross, MD   CH/MEDQ  D:  12/01/2007  T:  12/01/2007  Job:  [id number]   cc:   [name]. Riley Kill, MD, [facility]

## 2010-06-06 NOTE — Assessment & Plan Note (Signed)
[facility]                            CARDIOLOGY OFFICE NOTE   NAME:Pritts, EMONTE DIEUJUSTE                        MRN:          [id number]  DATE:06/04/2007                            DOB:          Nov 02, 1923    HISTORY:  Mr. Ferrara is in for followup.  There has been no change in his  anginal pattern.  It is at least moderate.  He has been otherwise  stable.  He denies any ongoing progressive symptoms, however.  He had a  carotid done today which does show progression of disease.  There is an  80-99% left internal carotid stenosis.  Unfortunately, he does have  modest renal insufficiency and was thought to have atheroemboli at the  last time of his last catheterization, so he is not a good angiographic  candidate.  I have stressed this to him.  We have tried to make an  appointment with vascular and vein specialist, and they said they will  call.  Therefore, we will send a copy of this note to their office so  that it can be reviewed.  He denies any particular cerebral symptoms.  He is supposed to have a procedure done for his macular degeneration.   PHYSICAL EXAMINATION:  VITAL SIGNS:  Blood pressure is 116/60, the pulse  is 72.  LUNGS:  Fields are clear.   DIAGNOSTICS:  Electrocardiogram demonstrates sinus rhythm and otherwise  within normal limits.   ASSESSMENT:  Overall, Mr. Schwanke is stable.  He has no cerebral symptoms,  but he does have progression in his left carotid.  He is not a good  candidate for angiography or CTA particularly.  He has modest elevation  in creatinine which is followed by Dr. Hyman Hopes.  He also has history of  atheroemboli during last catheterization.  However, he does have  progressive carotid disease.  I have recommended that he increase his  aspirin to 325 mg daily.  He will remain on this therapy at the present  time.  We will get him an appointment to see vascular and vein  specialists.  We will go on from there.     [name]. Riley Kill, MD, [facility]  Electronically Signed    TDS/MedQ  DD: 06/04/2007  DT: 06/04/2007  Job #: [id number]   cc:   Garnetta Buddy, M.D.  Vascular/Vein Specialist

## 2010-06-06 NOTE — Letter (Signed)
Jun 09, 2007    Re:  Andrew [name], Andrew [name]                 DOB:  [birth year]    PRIMARY PHYSICIAN:  Dr. Jacalyn Lefevre.   REASON FOR VISIT:  Evaluate left carotid stenosis.   HISTORY:  This is an 77 year old gentleman I am seeing at the request of  Dr. Riley Kill for evaluation of left carotid stenosis.  The patient  recently had a duplex ultrasound that revealed an 80-99% stenosis in the  left internal carotid artery.  The patient is asymptomatic.  He denies  numbness or weakness in either extremity.  He denies having slurring of  speech.  He has never had episodes of amaurosis fugax.   The patient has significant cardiac history, for which he has undergone  coronary artery bypass surgery in 1991.  He has been followed for this  by Dr. Riley Kill.  He also suffers from hypercholesterolemia, for which he  is controlled on medications.   REVIEW OF SYSTEMS:  GENERAL:  Negative for weight gain, weight loss.  No  fevers.  No chills.  CARDIAC:  Positive for chest pain and chest pressure.  Shortness of  breath with exertion.  PULMONARY:  Negative.  GASTROINTESTINAL:  Positive for reflux.  GENITOURINARY:  50% kidney funciton followed by Dr. Hyman Hopes.  VASCULAR:  Negative.  NEURO:  Positive for headaches.  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  ORTHO:  Positive for arthritis and joint pain, and a rash.  PSYCH:  Negative.  ENT:  He has macular degeneration.  HEMATOLOGIC:  Negative.   PAST MEDICAL HISTORY:  Hypercholesterolemia.  Coronary artery disease.  History of acoustic neuroma.  History of prostate cancer.  Chronic renal  insufficiency.  Macular degeneration.   PAST SURGICAL HISTORY:  Coronary artery bypass grafting,  cholecystectomy, acoustic neuroma resection.  Bilateral knee and  shoulder scopes.  Prostate cancer was treated with XRT, radiation, and  seeds.   FAMILY HISTORY:  Positive for coronary artery disease in his father and  his brother.   SOCIAL  HISTORY:  He is married with 2 children, works as an Art gallery manager.  He is retired.  He does not smoke.  Has a history of smoking, but quit  in 1963.  Drinks occasional alcohol.   MEDICATIONS:  Include Imdur 60 mg per day, Prilosec 20 mg daily, aspirin  325 per day, pravastatin 40 mg per day, PreserVision 2 tablets daily,  lutein 6 mg daily, Nephro-Vite 1 per day, vitamin D 5000 IU monthly,  Nitrolingual pump spray as needed.   ALLERGIES:  None.   PHYSICAL EXAMINATION:  Vital signs:  Heart rate 77, blood pressure  131/84 in the left arm, 133/85 in the right arm.  In general, he is well-  appearing, in no acute distress.  HEENT:  Normocephalic, atraumatic.  Pupils are equal.  Sclerae anicteric.  Neck with no JVD.  No carotid  bruits.  Cardiac is with no murmurs, rubs, or gallops.  Regular rate and  rhythm.  Pulmonary:  Lungs clear bilaterally.  Abdomen is soft and  nontender.  No hepatosplenomegaly.  No pulsatile mass.  Extremities are  warm and well-perfused.  Neuro:  Cranial nerves 2-12 grossly intact.  Psych is alert and oriented x3.  Skin is without rash.   DIAGNOSTIC STUDIES:  The patient comes with a duplex ultrasound from  [facility], which reveals a left carotid stenosis common to near 99%.  Repeat  duplex today in our office reveals 60-79% stenosis on the left  side.   ASSESSMENT:  Asymptomatic left carotid stenosis.   PLAN:  I had initially scheduled the patient for left carotid  endarterectomy.  Had our lab re-duplex him to make sure he had normal  internal carotid artery distal to his lesion.  On their evaluation, they  did not find velocities near the level that was previously obtained.  Based on their duplex, he falls in the 60-79% category.  For that  reason, I no longer recommend carotid endarterectomy, but rather  continued surveillance.  The patient will be scheduled to come back to  see me in 6 months.  He will get a duplex at [facility] prior to that, and  follow up with me  subsequent to that.   Jorge Ny, MD  Electronically Signed   VWB/MEDQ  D:  06/09/2007  T:  06/10/2007  Job:  [id number]   cc:   [name]. Riley Kill, MD, [facility]  Bertram Millard. Hyacinth Meeker, M.D.

## 2010-06-06 NOTE — Procedures (Signed)
CAROTID DUPLEX EXAM   INDICATION:  Follow up known carotid disease.   HISTORY:  Diabetes:  Yes  Cardiac:  Yes  Hypertension:  Yes  Smoking:  Yes  Previous Surgery:  No  CV History:  No  Amaurosis Fugax No, Paresthesias No, Hemiparesis No                                       RIGHT             LEFT  Brachial systolic pressure:         124               134  Brachial Doppler waveforms:         Normal            Normal  Vertebral direction of flow:        Antegrade         Antegrade  DUPLEX VELOCITIES (cm/sec)  CCA peak systolic                   111               79  ECA peak systolic                   103               106  ICA peak systolic                   147               245  ICA end diastolic                   36                82  PLAQUE MORPHOLOGY:                  Calcific          Heterogeneous  PLAQUE AMOUNT:                      Mild              Moderate  PLAQUE LOCATION:                    Bulb              ICA   IMPRESSION:  1. The right internal carotid artery shows 40- 59% stenosis.  2. The left internal carotid artery shows 60- 79% stenosis.  3. Bilateral vertebrals appear antegrade.   ___________________________________________  V. Charlena Cross, MD   CB/MEDQ  D:  12/13/2008  T:  12/14/2008  Job:  [id number]

## 2010-06-06 NOTE — Assessment & Plan Note (Signed)
[facility]                            CARDIOLOGY OFFICE NOTE   NAME:[name], Andrew PRINSEN                        MRN:          [id number]  DATE:09/25/2006                            DOB:          August 12, 1923    Andrew [name] is in for a followup visit. In general, he is stable. He does  have angina at moderate activity. He sleeps frequently during the day  but as his wife points out, his mind is very active. He had previous  atheroemboli during catheterization and he has mild renal insufficiency  and chronic stable angina.   MEDICATIONS:  1. Prilosec 20 mg daily.  2. Imdur 30 mg which he is now alternating with 60.  3. Pravastatin 40 daily.  4. Aspirin 81 mg daily.  5. Lutein.  6. Nephrovite.  7. PreserVision.   PHYSICAL EXAMINATION:  VITAL SIGNS:  The blood pressure is 138/80, the  pulse is 76.  LUNGS:  Lung fields are clear.  CARDIAC:  Rhythm is regular.  ABDOMEN:  Soft.  EXTREMITIES:  Reveal no edema.   EKG reveals normal sinus rhythm and is within normal limits.   IMPRESSION:  1. Coronary artery disease with prior coronary revascularization      surgery.  2. Chronic stable class 2/3 angina.  3. Hyperlipidemia on lipid-lowering therapy.  4. History of mild chronic renal insufficiency.  5. History of atheroemboli during cardiac catheterization.   PLAN:  1. Return in clinic in 3 months.  2. Continue current medical regimen.  3. We had an extensive discussion today about his clinical situation      and he will remain on this treatment.     [name]. Riley Kill, MD, [facility]  Electronically Signed    TDS/MedQ  DD: 09/25/2006  DT: 09/25/2006  Job #: [id number]

## 2010-06-06 NOTE — Procedures (Signed)
CAROTID DUPLEX EXAM   INDICATION:  Follow up known carotid artery disease.   HISTORY:  Diabetes:  Yes.  Cardiac:  Yes.  Hypertension:  Yes.  Smoking:  Yes.  Previous Surgery:  CV History:  Amaurosis Fugax , Paresthesias , Hemiparesis                                       RIGHT             LEFT  Brachial systolic pressure:  Brachial Doppler waveforms:  Vertebral direction of flow:                          Antegrade  DUPLEX VELOCITIES (cm/sec)  CCA peak systolic                                     50  ECA peak systolic                                     63  ICA peak systolic                                     249  ICA end diastolic                                     98  PLAQUE MORPHOLOGY:                                    Heterogenous  PLAQUE AMOUNT:                                        Moderate-to-severe  PLAQUE LOCATION:                                      ICA, ECA   IMPRESSION:  1. Limited study.  2. 60-79% stenosis noted in the left internal carotid artery.  3. Antegrade left vertebral arteries.   ___________________________________________  V. Charlena Cross, MD   MG/MEDQ  D:  06/09/2007  T:  06/09/2007  Job:  [id number]

## 2010-06-06 NOTE — Assessment & Plan Note (Signed)
OFFICE VISIT   Andrew [name], Andrew [name]  DOB:  1923/10/20                                       05/23/2009  CHART#:[id number]   REASON FOR VISIT:  Followup carotid stenosis.   HISTORY:  This is an 77 year old gentleman that I have been following  for extracranial carotid occlusive disease at the request of Dr.  Riley Kill.  He initially presented in May 2009, with ultrasound that  suggested high-grade stenosis.  It was repeated here in the office, and  we obtained numbers which showed 60-80% stenosis and end-diastolic  velocity in the 80s.  I elected to treat him medically since he was not  having any symptoms.  He had ultrasound since that time that confirmed  stable disease in the 60-80% range.  The patient comes back in today for  repeat ultrasound and further followup.  He continues to be without  symptoms.  No numbness or weakness in either extremity.  No amaurosis  fugax or slurring of speech.   The patient's cardiac disease has been stable and managed by Dr.  Riley Kill.  His blood pressure has been a little on the elevated side.  He  continues to be on Imdur for  his blood pressure.   Review of systems negative except as in the HPI as documented in the  encounter form.   PAST MEDICAL HISTORY:  Hypercholesterolemia, coronary artery disease,  history of an acoustic neuroma, history of prostate cancer, chronic  renal insufficiency and macular degeneration.   SOCIAL HISTORY:  Continues to be a nonsmoker.  He drinks occasional  alcohol.   PHYSICAL EXAMINATION:  Heart rate 53, blood pressure 145/80 in the right  arm, 136/79 in the left.  GENERAL:  Well-appearing, in no distress.  HEENT:  Within normal limits.  LUNGS:  Clear bilaterally.  CARDIOVASCULAR:  Regular rate and rhythm.  No carotid bruits.  ABDOMEN:  Soft, nontender.  MUSCULOSKELETAL:  Without major deformity.  NEURO:  He has no focal deficits.  Cranial nerves II-XII grossly intact.  SKIN:  Without  rash.   DIAGNOSTICS:  Duplex ultrasound was independently reviewed by myself  today.  This reveals 1-39% stenosis on the right and 60-79% stenosis on  the left.  These findings are essentially unchanged from his prior  study.  Diastolic velocities on the left were 70.   ASSESSMENT/PLAN:  Asymptomatic left carotid stenosis.   PLAN:  The patient continues to have stable disease in the 60-80% range  on the left.  As long as he continues to be asymptomatic, I recommend  continued medical therapy, without operation.  Will need to keep a close  eye on his disease.  He will come back in 6 months for a repeat  ultrasound as well as in 1 year.  I will see him in 1 year.  We  discussed the signs and symptoms of stroke what to do should these  occur.     Jorge Ny, MD  Electronically Signed   VWB/MEDQ  D:  05/23/2009  T:  05/24/2009  Job:  [id number]   cc:   [name]. Riley Kill, MD, [facility]  Bertram Millard. Hyacinth Meeker, M.D.

## 2010-06-09 NOTE — Assessment & Plan Note (Signed)
[facility]                            CARDIOLOGY OFFICE NOTE   NAME:Andrew [name], Andrew [name]                        MRN:          [id number]  DATE:02/22/2006                            DOB:          02-15-1923    Andrew [name] is in for followup. He seems to be a little bit more  symptomatic in general. He is somewhat more limited in what he is able  to accomplish. If he gets too much activity, he does get chest  tightness. Previously, there was some concern about whether he had a  rise in creatinine and perhaps renal insufficiency and he has been  followed by Dr. Elvis Coil.   MEDICATIONS:  1. Prilosec 20 mg daily.  2. Enteric coated aspirin 81 mg daily.  3. Nephrovite 1 daily.  4. __________  6 mg daily.  5. __________  vision 2 tablets daily.  6. Imdur 30 mg daily.  7. Pravastatin 40 mg q.h.s.   PHYSICAL EXAMINATION:  Her blood pressure was 125/78, pulse 63. Lung  fields are clear and the cardiac rhythm is regular with a systolic  ejection murmur that is 1-2/6.   Electrocardiogram  demonstrates normal sinus rhythm within normal  limits.   IMPRESSION:  1. Coronary artery disease with prior coronary artery bypass graft      surgery.  2. Chronic kidney disease.  3. Advanced age.  4. Some increase in angina pectoris.   PLAN:  1. Increase Imdur to 60 mg daily.  2. Continue close followup with consideration of cardiac      catheterization at some point although we would like to defer this      at the present time given his prior issues.     [name]. Riley Kill, MD, [facility]  Electronically Signed    TDS/MedQ  DD: 03/04/2006  DT: 03/04/2006  Job #: [id number]

## 2010-06-09 NOTE — Discharge Summary (Signed)
Andrew [name]. [facility]  Patient:    Andrew [name], Andrew [name] Visit Number: [id number] MRN: [id number]          Service Type: CAT Location: [id number] Attending Physician:  Ronaldo Miyamoto Dictated by:   Brita Romp, P.A. Admit Date:  09/19/2000 Discharge Date: 09/20/2000   CC:         Andrew [name]. Alwyn Ren, M.D. [facility]   Discharge Summary  DISCHARGE DIAGNOSES: 1. Coronary artery disease, status post cardiac catheterization. 2. Status post bypass surgery. 3. Benign prostatic hypertrophy. 4. Gastroesophageal reflux disease.  HISTORY OF PRESENT ILLNESS:  Andrew [name] is a very pleasant 77 year old male with a past history of bypass surgery in 1991.  He had been seen by [name]. Riley Kill, M.D., in the office and because of recurrent anginal symptoms, it was felt prudent to admit the patient for cardiac catheterization.  HOSPITAL COURSE:  On September 19, 2000, the patient was admitted to [facility] for overnight hydration and treatment with Mucomyst because of his history of renal insufficiency.  The next day, the patient was taken to the catheterization laboratory by [name]. Riley Kill, M.D.  He noted that the internal mammary artery graft was patent.  The saphenous vein graft to the OM2 and OM3 was occluded.  The right coronary artery graft was patent.  No left ventriculogram was performed due to the renal insufficiency.  After discussing the case with Bruce R. Juanda Chance, M.D., [name]. Riley Kill, M.D., elected for continued medical therapy of the patient.  After the usual period of bed rest, the patient was felt to be stable for discharge.  DISCHARGE MEDICATIONS: 1. Enteric-coated aspirin 81 mg q.d. 2. Prilosec 20 mg q.d. 3. Zantac 150 mg q.d. 4. Flomax 0.4 mg q.p.m. 5. Imdur 60 mg q.d.  DISCHARGE INSTRUCTIONS: 1. The patient was advised to increase fluid intake to a level of four to six    glasses of water a day through at least Tuesday. 2.  He is to avoid driving, heavy lifting, or tube baths for 48 hours. 3. He is to follow a low-fat diet. 4. He is to watch the catheterization site for any pain, bleeding, or swelling    and to call the [facility] office for any of these problems. 5. He is to have a BMET test drawn in the [facility] on Tuesday,    September 24, 2000. 6. He is to follow up with [name]. Riley Kill, M.D., in the office on    October 21, 2000, at 9:30 a.m.  Of note, this date may have to be    changed due to a conflict with vacation of the patient and his wife.  LABORATORY VALUES:  Sodium 141, potassium 4.2, chloride 110, CO2 28, BUN 28, creatinine 1.8, glucose 97.  White count 5.4, hemoglobin 12.3, hematocrit 35.0, platelets 137. Dictated by:   Brita Romp, P.A. Attending Physician:  Ronaldo Miyamoto DD:  09/20/00 TD:  09/20/00 Job: [id number]

## 2010-06-09 NOTE — Assessment & Plan Note (Signed)
[facility]                            CARDIOLOGY OFFICE NOTE   NAME:Everding, BOMANI OOMMEN                        MRN:          [id number]  DATE:03/12/2008                            DOB:          [birth year]    Mr. Dentler is in for a followup visit.  He continues to remain stable.  He  has controlled angina.  It would be class II to nearly class III.  However, has had a chronically stable pattern and we have been trying to  avoid catheterization and as there has been no progression in his  symptoms, we have held off.  He underwent echocardiography today.  This  reveals overall EF of 40-45%.  The aortic valve was moderately calcified  and the transvalvular gradient was 17 with an aortic valve area of 1.33  cm2.  His previous echocardiogram was read as ejection fraction of 50-  55%, but with mild hypokinesis of the inferior-posterior wall.  They  remains mildly decreased, overall, leaflet excursion.  The patient has  not had increased or shortness of breath, but his overall exercise  tolerance is fairly limited.   CURRENT MEDICATIONS:  1. Prilosec 20 mg daily.  2. Pravastatin 40 mg daily.  3. PreserVision 2 daily Lutein.  4. Imdur 60 mg daily.  5. Aspirin 325 mg daily.  6. Vitamin D daily.   PHYSICAL EXAMINATION:  GENERAL:  He is alert and oriented. He is in no  distress.  His mentation is normal.  VITAL SIGNS:  Blood pressure is 110/78 and the pulse is 67.  LUNGS:  Fields were clear.  CARDIAC:  There is a systolic ejection murmur compatible with aortic  valve stenosis.  EXTREMITIES:  There is no extremity edema.   The electrocardiogram demonstrates normal sinus rhythm essentially  within normal limits.   IMPRESSION:  1. Coronary artery disease status post coronary artery bypass graft      surgery with recurrent disease in class II-III angina.  2. Aortic stenosis.  3. History of probable catheter-related emboli.  4. Hypercholesterolemia, on  lipid-lowering therapy.  5. Mild reduction in overall left ventricular function compared to      previous studies.   PLAN:  1. Continue current medical regimen.  2. We will repeat an echocardiogram in 6 months.     [name]. Riley Kill, MD, [facility]  Electronically Signed    TDS/MedQ  DD: 03/12/2008  DT: 03/13/2008  Job #: [id number]

## 2010-06-09 NOTE — Assessment & Plan Note (Signed)
[facility]                              CARDIOLOGY OFFICE NOTE   NAME:Gayle, RHEA THRUN                        MRN:          [id number]  DATE:11/08/2005                            DOB:          1923-03-01    HISTORY OF PRESENT ILLNESS:  Mr. Pitstick is in for followup.  In general, he  has gotten along reasonably well.  He did have some angina when he was on  vacation in [place] but has not had a tremendous disability from this.  He is  not all that active, but he still is able to ambulate.   MEDICATIONS:  1. Prilosec 20 mg daily.  2. Enteric coated aspirin 81 mg daily.  3. Nephrovite 1 daily.  4. __________ .  5. PreserVision 2 tabs daily.  6. Imdur 30 mg daily.  7. Pravastatin 40 q. h.s.   PHYSICAL EXAMINATION:  VITAL SIGNS:  Blood pressure:  112/68.  Pulse:  66.  There is a systolic ejection murmur with no diastolic murmurs appreciated.  The findings suggest aortic sclerosis/mild aortic stenosis.   The patient has had prior bypass surgery.  He remains stable.  He has mild  aortic valvular stenosis but it is not severe.  We will get a 2D  echocardiogram to track this to make sure it is not worse and follow his  left ventricular function.  We will have him return to clinic in 6 months.       [name]. Riley Kill, MD, [facility]     TDS/MedQ  DD:  11/08/2005  DT:  11/11/2005  Job #:  [id number]

## 2010-06-09 NOTE — Op Note (Signed)
[place]. [facility]  Patient:    Andrew [name], Andrew [name]                        MRN: [id number] Proc. Date: 02/13/00 Adm. Date:  [id number] Attending:  Nelma Rothman Iii                           Operative Report  DIAGNOSIS:  Adenocarcinoma of the prostate.  PROCEDURE:  Transperineal implantation of palladium-103 seeds in the prostate.  SURGEON:  Lucrezia Starch. Ovidio Hanger, M.D.  ASSISTANT:  Wynn Banker, M.D.  ANESTHESIA:  General endotracheal.  ESTIMATED BLOOD LOSS:  10 cc.  TUBES:  16 French Foley.  COMPLICATIONS:  None.  NOTE:  A total of 110 seeds were implanted with 26 needles at 1.293 mCi per seed, and Interstrand seeds were utilized.  INDICATION FOR PROCEDURE:  Mr. Bellard is a very nice 77 year old white male who presented with an elevated PSA.  He subsequently underwent transrectal ultrasound and biopsy of the prostate, which revealed a Gleasons score 7 adenocarcinoma in a microfocus.  The PSA was 7.73.  He has considered all options carefully and elected to proceed with a combination of external beam therapy and seed implantation.  He has been properly informed.  He has completed his external beam therapy and has been properly simulated.  DESCRIPTION OF PROCEDURE:  The patient was placed in the supine position. After proper general endotracheal anesthesia, he was placed in the dorsal lithotomy position and prepped and draped with Betadine in sterile fashion.  A 16 French Foley catheter was inserted and inflated with 10 cc of contrast solution, and the transrectal ultrasound probe was placed on the Gillis stabilization device and localized to preplanned coordinates.  Fluoroscopy was placed, and the wound was prepped and draped sterilely.  Utilizing both electronic and physical grid, two holding needles were placed inunused coordinates, and proper measurements were obtained.  It might be noted that a 16 French red rubber catheter had been  placed into the rectum to prevent flatus and improve the image.  Following this, serial implantation of the prostate was performed utilizing interstrands of palladium-103 seeds.  A total of 110 seeds were implanted with 26 needles at 1.293 mCi per seed.  The seeds appeared to distribute well throughout the prostate gland both by ultrasound and by fluoroscopy, and we were comfortable with their localization. Following implantation, transrectal ultrasound probe was removed along with the red rubber catheter, and static images were obtained fluoroscopically with and without the ultrasound probe in place.  The wound was dressed sterilely. The patient was placed in the supine position, the Foley catheter was removed and scanned for seeds, and there were none within it.  Flexible cystourethroscopy was then performed with an Olympus flexible cystourethroscope.  He was noted to have mild trilobar hypertrophy.  Grade 2 trabeculation was noted.  The efflux of clear urine was noted bilaterally, and there were no seeds or strands in thebladder or the urethra.  The flexible cystourethroscope was visually removed.  A new 16 French Foley catheter was inserted, and the patient was taken to the recovery room stable. DD:  02/13/00 TD:  02/13/00 JYN:[id number]

## 2010-06-09 NOTE — Assessment & Plan Note (Signed)
[facility]                            CARDIOLOGY OFFICE NOTE   NAME:Bosque, BOYSIE BONEBRAKE                        MRN:          [id number]  DATE:04/09/2006                            DOB:          1923/09/10    Mr. Coach is in for followup. In general, he is about the same. He can go  at times without getting much in the way of angina and at other times he  does have some angina. His overall ability in terms of activity is less  than it was in the past. His recent laboratory work done at the  nephrology office did reveal a hemoglobin  of 15.9. His BUN and  creatinine have stabilized at 25 and 1.5.   PHYSICAL EXAMINATION:  Blood pressure is 124/79, pulse is 75.  Lung fields are clear.  CARDIAC: Rhythm is regular. There is a soft systolic ejection murmur.   Last echocardiogram in October of this past year revealed EF of 50-55%.  There is mild aortic valve stenosis. The means transvalvular gradient  was 6.   IMPRESSION:  1. Coronary artery disease with prior coronary revascularization      surgery with recurrent angina.  2. History of mild chronic renal insufficiency with a history of      atheroemboli in the past.  3. Advanced age.  4. Hypertension, controlled.  5. Prostate cancer, in remission.   RECOMMENDATIONS:  1. Continue current medical regimen.  2. Followup in the office in approximately 2 to 3 months for      reassessment.     [name]. Riley Kill, MD, [facility]  Electronically Signed    TDS/MedQ  DD: 04/09/2006  DT: 04/09/2006  Job #: [id number]

## 2010-06-09 NOTE — Discharge Summary (Signed)
[place]. [facility]  Patient:    Andrew [name], Andrew [name]                          MRN: [id number] Adm. Date:  08/14/99 Disc. Date: 08/15/99 Attending:  [name]. Riley Kill, M.D. [facility] Dictator:   Gene Serpe, P.A.                  Referring Physician Discharge [place]  PROCEDURES:  Cardiac catheterization.  REASON FOR ADMISSION:  Mr. Schuenemann is a 77 year old male patient of Dr. Ermalene Postin with previously-documented CAD, notable for four-vessel CABG in 1991, who developed mild chest discomfort and associated ST depression during recent routine exercise treadmill testing.  LABORATORY DATA:  Electrolytes and renal function within normal limits. Normal hemoglobin/hematocrit indices, normal WBC and marginally low platelet count at 146.  Elevated MCV 100.3.  INR 1.0.  HOSPITAL COURSE:  Patient underwent elective cardiac catheterization on July 23 by Dr. Ermalene Postin (see catheterization report for full details), revealing proximal subtotal occlusion of SVG to OM-1 to OM-2 graft, with widely-patent LIMA to LAD; patent SVG to RCA graft with 70-80% stenosis distal to the anastomosis site.  Left ventriculogram:  Normal LVF, no WMA.  Dr. Riley Kill recommended medical therapy.  No medication adjustments made at this time - this will be reviewed at time of follow-up next week.  Patient was cleared for discharge the following morning, hemodynamically stable, and without complaint of chest pain.  Right groin was devoid of any hematoma/ecchymosis or bruit.  MEDICATIONS AT DISCHARGE: 1. Aspirin 81 mg q.d. 2. Prilosec 20 mg q.a.m. 3. Zantac 150 mg q.h.s. 4. Nitrostat as directed.  INSTRUCTIONS:  ACTIVITY:  Refrain from strenuous activity/heavy lifting/driving x 2 days.  DIET:  Maintain low fat/cholesterol diet.  WOUND CARE:  Call the office if there is any swelling/bleeding of the groin.  FOLLOW-UP:  Patient will follow up with Dr. Bonnee Quin on Thursday, August 2 at 3:45  p.m.  DISCHARGE DIAGNOSES: 1. Coronary artery disease.    a. Status post abnormal graded exercise test.    b. Cardiac catheterization August 14, 1999:  99% ostial saphenous vein       graft to first obtuse marginal to second obtuse marginal graft -       medical therapy recommended.    c. Four-vessel coronary artery bypass grafting in 1991. 2. History of gastritis. DD:  08/15/99 TD:  08/15/99 Job: [id number]

## 2010-06-09 NOTE — Cardiovascular Report (Signed)
[place]. [facility]  Patient:    Andrew [name], Andrew [name] Visit Number: [id number] MRN: [id number]          Service Type: Attending:  [name]. Riley Kill, M.D. [facility] Dictated by:   [name]. Riley Kill, M.D. [facility] Proc. Date: 09/20/00                          Cardiac Catheterization  REDICTATION  This study was previously dictated. The dictation number noted on the chart is [id number] from September 20, 2000. This procedure report cannot be found in the system, but the number in the log should be noted.  INDICATIONS: The patient is a very delightful 77 year old male, well known to me. The patient has evidence of some progressive renal insufficiency, and he has been well hydrated. I discussed his case with Dr. Charlies Constable. The patient has had some increasing angina pectoris and as a result, was brought to the lab for further evaluation. Importantly, left ventriculography and aortic root angiography was not done to limit the contrast. In addition, the native RCA and the OM-2, 3 graft has been known to be occluded from the previous study and therefore was not done. The study was performed with 23 cc of contrast and the patient received treatment with Mucomyst. The situation was carefully discussed with the patient. Preprocedural BUN 28, creatinine 1.8.  DESCRIPTION OF PROCEDURE: The procedure was performed from the femoral artery using 6 French catheters.  He tolerated the procedure without complication.  HEMODYNAMICS: The central aortic pressure 166/81.  ANGIOGRAPHIC DATA: 1. On plain fluoroscopy, there was extensive calcification of the coronary    arteries. 2. The left main coronary artery was free of critical disease. 3. The LAD demonstrates heavy calcification with diffuse disease and    essentially total occlusion of the LAD proper. There is a diagonal that    has multiple tandem stenoses of 70 and 90% and this is a vessel with    overall caliber of less than a millimeter  and not suitable for    grafting. The LAD is occluded beyond the septal perforator. 4. The saphenous vein graft to the distal right coronary artery is    widely patent. The graft itself is in reasonable shape, although there is    narrowing in the AV circumflex with about 70% followed by 80% prior to    the takeoff of the posterior descending branch. The PDA, first    posterolateral and second posterolateral are all small in caliber and have    some diffuse luminal irregularities. There is about 60% narrowing and    50% mid narrowing of the first posterolateral branch. I am sure by    ultrasound there would be extensive disease at all of these vessels.  The internal mammary to the distal LAD is widely patent. There are some luminal irregularities of the distal LAD that bifurcates distally and is intact.  The circumflex system provides a first marginal branch. It is essentially has tandem lesions of 70 and 80%. The vein graft is seen to attach the OM-2, and OM-3 and the OM-2 and OM-3 both appear to be graftable vessels. The top portion of the graft is known to be occluded leading into this area so that these areas connect with one another. The AV circumflex demonstrates a total occlusion of the OM-3 and a AV circumflex that is small in caliber with some irregularities but noncritical disease.  CONCLUSIONS: 1. Continued patency of the  internal mammary to the left anterior descending    artery. 2. Multiple lesions in the circumflex with continued patency of the    distal limb of the obtuse marginal graft, but occlusion of the proximal    limb. 3. Continued patency of the saphenous vein graft to the distal right coronary    artery with extensive distal disease.  Given the patients diffuse disease and small caliber vessels, Dr. Juanda Chance and I reviewed the films, and it is our feeling that the patient should be treated medically. The lesions are not ideal for percutaneous intervention of  the target vessel. Revascularization rate would be high. There would also be some risks related to the patients renal insufficiency. We will continue to follow the patient in the office with medical therapy. Dictated by:   [name] Riley Kill, M.D. [facility] Attending:  [name]. Riley Kill, M.D. [facility] DD:  02/27/01 TD:  02/28/01 Job: [id number]

## 2010-06-14 ENCOUNTER — Encounter: Payer: Self-pay | Admitting: Family Medicine

## 2010-06-14 ENCOUNTER — Ambulatory Visit (INDEPENDENT_AMBULATORY_CARE_PROVIDER_SITE_OTHER): Payer: 59 | Admitting: Family Medicine

## 2010-06-14 VITALS — BP 110/70 | HR 70 | Temp 97.9°F | Ht 70.0 in | Wt 195.1 lb

## 2010-06-14 DIAGNOSIS — J984 Other disorders of lung: Secondary | ICD-10-CM

## 2010-06-14 DIAGNOSIS — E041 Nontoxic single thyroid nodule: Secondary | ICD-10-CM

## 2010-06-14 DIAGNOSIS — R42 Dizziness and giddiness: Secondary | ICD-10-CM

## 2010-06-14 DIAGNOSIS — R911 Solitary pulmonary nodule: Secondary | ICD-10-CM

## 2010-06-14 DIAGNOSIS — Z9889 Other specified postprocedural states: Secondary | ICD-10-CM

## 2010-06-14 DIAGNOSIS — Z86018 Personal history of other benign neoplasm: Secondary | ICD-10-CM

## 2010-06-14 NOTE — Progress Notes (Signed)
77 year old with CAD s/p CABG, CRI, after an abnormal thyroid biopsy:  Vertigo is coming and going. Balance problem is coming and going. 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  He usually sees his former ENT in [place], but is interested in seeing a more local MD.  Thyroid: bx findings have been discussed in full with the patient by myself, Dr. Riley Kill, and Dr. Talmage Nap. They have also had long family conversations about this and the way in which they would like to proceed.  Patient Active Problem List  Diagnoses  . HYPERCHOLESTEROLEMIA  . ANGINA PECTORIS, CLASS II  . CAD, ARTERY BYPASS GRAFT  . AORTIC STENOSIS  . ALLERGIC RHINITIS  . GASTROESOPHAGEAL REFLUX DISEASE  . RENAL DISEASE, CHRONIC, MILD  . BACK PAIN  . COLONIC POLYPS, HX OF  . CHICKENPOX, HX OF  . THYROID NODULE  . CAROTID ARTERY STENOSIS  . Vertigo  . Pulmonary nodule   Past Medical History  Diagnosis Date  . HYPERCHOLESTEROLEMIA   . CAD, ARTERY BYPASS GRAFT   . CAROTID ARTERY STENOSIS 03/07/2010    80%  . AORTIC STENOSIS   . GASTROESOPHAGEAL REFLUX DISEASE   . Skin cancer   . History of colonic polyps     1999, 2004  . Allergic rhinitis   . Thyroid nodule 05/2010    Abnormal biopsy, 77 year old patient and his wife have made informed decision to not pursue surgical resection. Potential risk including cancer has been thoroughly discussed with the patient.  . S/P excision of acoustic neuroma   . History of prostate cancer 2002    s/p treatment with seeds / radiation   Past Surgical History  Procedure Date  . Coronary artery bypass graft   . Transperineal implatation of palladium   . Cholecystectomy   . Shoulder surgery   . Total knee arthroplasty   . Cataract extraction   . Tonsillectomy and adenoidectomy 1932  . Craniectomy for excision of acoustic neuroma 1985  . Cataract extraction 2010   History  Substance Use  Topics  . Smoking status: Former Smoker    Types: Cigarettes    Quit date: 01/22/1961  . Smokeless tobacco: Not on file  . Alcohol Use: 0.5 oz/week    1 drink(s) per week   Family History  Problem Relation Age of Onset  . Emphysema Brother   . Alcohol abuse    . Arthritis    . Heart disease Father   . Cancer    . Macular degeneration    . Colon cancer Sister   . Heart disease Brother    No Known Allergies Current Outpatient Prescriptions on File Prior to Visit  Medication Sig Dispense Refill  . aspirin EC 325 MG EC tablet Take 325 mg by mouth daily.        . isosorbide mononitrate (IMDUR) 60 MG 24 hr tablet Take 60 mg by mouth daily.        . Lutein 6 MG CAPS Take by mouth daily.        . nitroGLYCERIN (NITROLINGUAL) 0.4 MG/SPRAY spray Place 1 spray under the tongue every 5 (five) minutes as needed.        Marland Kitchen omeprazole (PRILOSEC) 20 MG capsule Take 20 mg by mouth daily.        . pravastatin (PRAVACHOL) 40 MG tablet Take 40 mg by mouth daily.  ROS: occ chest pain. Vertigo as above. Eating and drinking well. Mentally intact. Walking without assistance.   Physical Exam  Blood pressure 110/70, pulse 70, temperature 97.9 F (36.6 C), temperature source Oral, height 5\' 10"  (1.778 m), weight 195 lb 1.9 oz (88.506 kg), SpO2 96.00%.  GEN: WDWN, NAD, Non-toxic, A & O x 3 HEENT: Atraumatic, Normocephalic. Neck supple. No masses, No LAD. Ears and Nose: No external deformity. CV: RRR, 3/6 SEM. No JVD. No thrill. No extra heart sounds. PULM: CTA B, no wheezes, crackles, rhonchi. No retractions. No resp. distress. No accessory muscle use. EXTR: No c/c/e NEURO Normal gait.  PSYCH: Normally interactive. Conversant. Not depressed or anxious appearing.  Calm demeanor.   A/P: 1. Thyroid nodule and abnormal biopsy: >25 minutes spent in face to face time with patient, >50% spent in counselling or coordination of care: spent a long time discusses potential risks and benefits to  operative removal, risks of anesthesia, co-morbidities. The patient's daughter who is a International aid/development worker had a long discussion with them. The patient is highly intelligent. At this point, I think the best idea is for them to follow-up with Dr. Gerrit Friends to discuss how best to monitor. Additionally, they had questions that I could not answer.  2. Vertigo. H/o acoustic neuroma surgery. Consult Dr. Pollyann Kennedy.

## 2010-06-14 NOTE — Patient Instructions (Signed)
REFERRAL: GO THE THE FRONT ROOM AT THE ENTRANCE OF OUR CLINIC, NEAR CHECK IN. ASK FOR Andrew [name]. SHE WILL HELP YOU SET UP YOUR REFERRAL. DATE: TIME:   F/u 6 months 

## 2010-06-17 ENCOUNTER — Encounter: Payer: Self-pay | Admitting: Family Medicine

## 2010-06-17 DIAGNOSIS — R911 Solitary pulmonary nodule: Secondary | ICD-10-CM | POA: Insufficient documentation

## 2010-06-18 ENCOUNTER — Encounter: Payer: Self-pay | Admitting: Family Medicine

## 2010-06-18 DIAGNOSIS — Z86018 Personal history of other benign neoplasm: Secondary | ICD-10-CM | POA: Insufficient documentation

## 2010-06-27 ENCOUNTER — Encounter: Payer: Self-pay | Admitting: Cardiology

## 2010-07-04 ENCOUNTER — Encounter: Payer: Self-pay | Admitting: Cardiology

## 2010-07-11 ENCOUNTER — Encounter: Payer: Self-pay | Admitting: Family Medicine

## 2010-07-11 ENCOUNTER — Ambulatory Visit (INDEPENDENT_AMBULATORY_CARE_PROVIDER_SITE_OTHER): Payer: 59 | Admitting: Family Medicine

## 2010-07-11 VITALS — BP 140/80 | HR 70 | Temp 98.7°F | Ht 70.0 in | Wt 195.0 lb

## 2010-07-11 DIAGNOSIS — T148XXA Other injury of unspecified body region, initial encounter: Secondary | ICD-10-CM

## 2010-07-11 DIAGNOSIS — W57XXXA Bitten or stung by nonvenomous insect and other nonvenomous arthropods, initial encounter: Secondary | ICD-10-CM

## 2010-07-11 DIAGNOSIS — T148 Other injury of unspecified body region: Secondary | ICD-10-CM

## 2010-07-11 NOTE — Progress Notes (Signed)
Andrew [name], a 77 y.o. male presents today in the office for the following:    Got a tick bite about ten days ago. Deer tick - was imbedded in the skin on his R forearm. Really small tick.   Daughter has had Lyme disease. And one of his grandchildren.  He has been asymptomatic.  Patient Active Problem List  Diagnoses  . HYPERCHOLESTEROLEMIA  . ANGINA PECTORIS, CLASS II  . CAD, ARTERY BYPASS GRAFT  . AORTIC STENOSIS  . ALLERGIC RHINITIS  . GASTROESOPHAGEAL REFLUX DISEASE  . RENAL DISEASE, CHRONIC, MILD  . BACK PAIN  . COLONIC POLYPS, HX OF  . THYROID NODULE  . CAROTID ARTERY STENOSIS  . Vertigo  . Pulmonary nodule  . S/P excision of acoustic neuroma   Past Medical History  Diagnosis Date  . HYPERCHOLESTEROLEMIA   . CAD, ARTERY BYPASS GRAFT   . CAROTID ARTERY STENOSIS 03/07/2010    80%  . AORTIC STENOSIS   . GASTROESOPHAGEAL REFLUX DISEASE   . Skin cancer   . History of colonic polyps     1999, 2004  . Allergic rhinitis   . Thyroid nodule 05/2010    Abnormal biopsy, 77 year old patient and his wife have made informed decision to not pursue surgical resection. Potential risk including cancer has been thoroughly discussed with the patient.  . S/P excision of acoustic neuroma   . History of prostate cancer 2002    s/p treatment with seeds / radiation   Past Surgical History  Procedure Date  . Coronary artery bypass graft   . Transperineal implatation of palladium   . Cholecystectomy   . Shoulder surgery   . Total knee arthroplasty   . Cataract extraction   . Tonsillectomy and adenoidectomy 1932  . Craniectomy for excision of acoustic neuroma 1985  . Cataract extraction 2010   History  Substance Use Topics  . Smoking status: Former Smoker    Types: Cigarettes    Quit date: 01/22/1961  . Smokeless tobacco: Not on file  . Alcohol Use: 0.5 oz/week    1 drink(s) per week   Family History  Problem Relation Age of Onset  . Emphysema Brother   . Alcohol abuse      . Arthritis    . Heart disease Father   . Cancer    . Macular degeneration    . Colon cancer Sister   . Heart disease Brother    No Known Allergies Current Outpatient Prescriptions on File Prior to Visit  Medication Sig Dispense Refill  . aspirin EC 325 MG EC tablet Take 325 mg by mouth daily.        . B Complex-C-Folic Acid (NEPHRO-VITE PO) Take 1 tablet by mouth daily.        . isosorbide mononitrate (IMDUR) 60 MG 24 hr tablet Take 60 mg by mouth daily.        . Lutein 6 MG CAPS Take by mouth daily.        . Multiple Vitamins-Minerals (RA VISION-VITE PRESERVE PO) Take 2 capsules by mouth daily.        . nitroGLYCERIN (NITROLINGUAL) 0.4 MG/SPRAY spray Place 1 spray under the tongue every 5 (five) minutes as needed.        Marland Kitchen omeprazole (PRILOSEC) 20 MG capsule Take 20 mg by mouth daily.        . pravastatin (PRAVACHOL) 40 MG tablet Take 40 mg by mouth daily.        . Vitamin D,  Cholecalciferol, 400 UNITS CHEW Chew 1 tablet by mouth daily.         ROS: GEN: No acute illnesses, no fevers, chills. GI: No n/v/d, eating normally Pulm: No SOB Interactive and getting along well at home.  Otherwise, ROS is as per the HPI.   Physical Exam  Blood pressure 140/80, pulse 70, temperature 98.7 F (37.1 C), temperature source Oral, height 5\' 10"  (1.778 m), weight 195 lb (88.451 kg), SpO2 98.00%.  GEN: WDWN, NAD, Non-toxic, A & O x 3 HEENT: Atraumatic, Normocephalic. Neck supple. No masses, No LAD. Ears and Nose: No external deformity. EXTR: No c/c/e NEURO Normal gait.  PSYCH: Normally interactive. Conversant. Not depressed or anxious appearing.  Calm demeanor.  Skin: signs of healing R forearm, small area, no diffuse rash. No warmth or redness.  A/P: Tick bite: no sign of infection, observe now only. 07/01/2010 DOI

## 2010-07-17 ENCOUNTER — Telehealth: Payer: Self-pay | Admitting: *Deleted

## 2010-07-17 MED ORDER — DOXYCYCLINE HYCLATE 100 MG PO TBEC: 100.0000 mg | DELAYED_RELEASE_TABLET | Freq: Two times a day (BID) | ORAL | Status: DC

## 2010-07-17 NOTE — Telephone Encounter (Signed)
Please call in  Doxycycline 100 mg, 1 po twice daily, #20  Let them know

## 2010-07-17 NOTE — Telephone Encounter (Signed)
Patient advised and rx sent to pharmacy  

## 2010-07-17 NOTE — Telephone Encounter (Signed)
Pt was seen last week for a tick bite and was told to call if any rashes or other symptoms developed.  He states he has developed a rash on his hand and would like antibiotic called to [facility].

## 2010-07-18 ENCOUNTER — Ambulatory Visit: Payer: 59 | Attending: Psychology | Admitting: Physical Therapy

## 2010-07-19 ENCOUNTER — Encounter (INDEPENDENT_AMBULATORY_CARE_PROVIDER_SITE_OTHER): Payer: Self-pay | Admitting: Surgery

## 2010-07-19 ENCOUNTER — Ambulatory Visit (INDEPENDENT_AMBULATORY_CARE_PROVIDER_SITE_OTHER): Payer: Medicare Other | Admitting: Surgery

## 2010-07-19 DIAGNOSIS — E041 Nontoxic single thyroid nodule: Secondary | ICD-10-CM

## 2010-07-19 NOTE — Progress Notes (Signed)
History: Patient is a referred by Dr. Talmage Nap for evaluation of thyroid nodule.  The patient had a carotid duplex performed in February of 2012. An incidental finding was a right thyroid nodule.  Patient subsequently had a thyroid ultrasound on March 15, 2010. This showed a dominant nodule in the right lobe measuring 3.1 cm. Biopsy was recommended. Patient underwent an ultrasound-guided FNA on April 11.  This demonstrated a follicular lesion with nuclear grooves and intranuclear inclusions. Patient was evaluated by Dr. Talmage Nap and referred to our office for consideration for possible surgical resection.  Patient has had no prior history of thyroid disease.  He did have an acoustical neuroma removed in 1985. He had no other head or neck surgery. He has never been on thyroid medication.  Patient does have a family history of thyroid disease in his sister who had a thyroid goiter.  She was treated with medication There is no other family history of endocrine disease.  Review of systems: Patient is asymptomatic regarding his thyroid nodule. No dysphagia.   No dyspnea. No tremors.  No palpitations.  Exam: HEENT: Normocephalic, sclerae clear, pupils equal, dentition fair, mucous membranes moist, voice normal. Neck: anterior examination symmetric. Palpation reveals a dominant nodule in the right thyroid lobe which is best felt with swallowing. It is mobile. No tenderness. There is no anterior or posterior cervical lymphadenopathy. Chest: clear to auscultation bilaterally without rales rhonchi wheeze Cardiac: regular rate and rhythm without significant murmur Extremities:  No edema, no tenderness Neurologic:No sign of tremor, no focal neurologic deficits.  Assessment: Right thyroid nodule, 3.1 cm, with cytologic atypia on fine needle aspiration cytology  Plan: I had a lengthy discussion today with the patient and his wife.  They are concerned about surgical resection due to his advanced age and cardiac  status.   I have offered thyroid lobectomy for definitive diagnosis. We have discussed the risks, and the potential hospital stay.   An alternative would be to closely monitor the nodule with a follow-up ultrasound in 6 months.  The patient and his wife prefer a non-surgical approach despite the possibility of malignancy.  I think monitoring this nodule for the next several months is low risk.  Patient will return to se me in 6 months for physical examination. We will review his follow-up thyroid ulltrasound at that time.

## 2010-07-31 ENCOUNTER — Other Ambulatory Visit (INDEPENDENT_AMBULATORY_CARE_PROVIDER_SITE_OTHER): Payer: 59

## 2010-07-31 ENCOUNTER — Ambulatory Visit: Payer: 59 | Admitting: Surgery

## 2010-07-31 DIAGNOSIS — I6529 Occlusion and stenosis of unspecified carotid artery: Secondary | ICD-10-CM

## 2010-08-10 ENCOUNTER — Encounter: Payer: Self-pay | Admitting: Cardiology

## 2010-08-10 ENCOUNTER — Ambulatory Visit (INDEPENDENT_AMBULATORY_CARE_PROVIDER_SITE_OTHER): Payer: Medicare Other | Admitting: Cardiology

## 2010-08-10 VITALS — BP 132/72 | HR 68 | Resp 18 | Ht 70.0 in | Wt 195.0 lb

## 2010-08-10 DIAGNOSIS — E7800 Pure hypercholesterolemia, unspecified: Secondary | ICD-10-CM

## 2010-08-10 DIAGNOSIS — I2510 Atherosclerotic heart disease of native coronary artery without angina pectoris: Secondary | ICD-10-CM

## 2010-08-10 DIAGNOSIS — I25810 Atherosclerosis of coronary artery bypass graft(s) without angina pectoris: Secondary | ICD-10-CM

## 2010-08-10 DIAGNOSIS — I6529 Occlusion and stenosis of unspecified carotid artery: Secondary | ICD-10-CM

## 2010-08-10 DIAGNOSIS — I359 Nonrheumatic aortic valve disorder, unspecified: Secondary | ICD-10-CM

## 2010-08-10 MED ORDER — ISOSORBIDE MONONITRATE ER 60 MG PO TB24: 60.0000 mg | ORAL_TABLET | Freq: Every day | ORAL | Status: DC

## 2010-08-14 ENCOUNTER — Ambulatory Visit: Payer: Medicare Other | Attending: Family Medicine | Admitting: Physical Therapy

## 2010-08-14 DIAGNOSIS — R269 Unspecified abnormalities of gait and mobility: Secondary | ICD-10-CM | POA: Insufficient documentation

## 2010-08-14 DIAGNOSIS — R262 Difficulty in walking, not elsewhere classified: Secondary | ICD-10-CM | POA: Insufficient documentation

## 2010-08-14 DIAGNOSIS — IMO0001 Reserved for inherently not codable concepts without codable children: Secondary | ICD-10-CM | POA: Insufficient documentation

## 2010-08-14 NOTE — Assessment & Plan Note (Signed)
He remains with class II or III symptoms, but stable.  We can adjust his isosorbide as he is taking 60mg  one day and 30mg  the next.  He will try increasing to 60mg  daily at this point in time.

## 2010-08-14 NOTE — Assessment & Plan Note (Signed)
LDL 5 months ago and last year both at target.  Continue medical therapy.  Importance of treatment in light of carotid and cardiac issues reinforced.

## 2010-08-14 NOTE — Assessment & Plan Note (Signed)
He does not seem symptomatic from his AS at this point.  However, this is added to underlying likely SVG and/or native disease.  Would continue medical therapy, and we will repeat in one year.

## 2010-08-14 NOTE — Progress Notes (Signed)
HPI:  He is in for follow up and we had a long discussion today regarding his condition.  He has some concerns about not proceeding with CE, but his daughter is not in favor, and I layed out the case both for and against.  I don't think he is a good stent candidate for sure, given his rise in creatinine with last cath thought to have some component of atheroemboli.  He would probably tolerate surgery, but given his age, and symptomatic CAD post Cabg, the risks are some increased.  He also saw Darnell Level, and they agreed the best option presently would be a repeat ultrasound of the neck.  Patient prefers this as well, and they are familiar with Tawanna Cooler as he cared for mrs. Hari a number of times.  The patient has some chest pain at present, but it is not prohibitive.  He does have to stop at some intervals.    Current Outpatient Prescriptions  Medication Sig Dispense Refill  . aspirin EC 325 MG EC tablet Take 325 mg by mouth daily.        . B Complex-C-Folic Acid (NEPHRO-VITE PO) Take 1 tablet by mouth daily.        . isosorbide mononitrate (IMDUR) 60 MG 24 hr tablet Take 1 tablet (60 mg total) by mouth daily.  90 tablet  3  . Lutein 6 MG CAPS Take by mouth daily.        . Multiple Vitamins-Minerals (RA VISION-VITE PRESERVE PO) Take 2 capsules by mouth daily.        . nitroGLYCERIN (NITROLINGUAL) 0.4 MG/SPRAY spray Place 1 spray under the tongue every 5 (five) minutes as needed.        Marland Kitchen omeprazole (PRILOSEC) 20 MG capsule Take 20 mg by mouth daily.        . pravastatin (PRAVACHOL) 40 MG tablet Take 40 mg by mouth daily.        . Vitamin D, Cholecalciferol, 400 UNITS CHEW Chew 1 tablet by mouth daily.          No Known Allergies  Past Medical History  Diagnosis Date  . HYPERCHOLESTEROLEMIA   . CAD, ARTERY BYPASS GRAFT   . CAROTID ARTERY STENOSIS 03/07/2010    80%  . AORTIC STENOSIS   . GASTROESOPHAGEAL REFLUX DISEASE   . Skin cancer   . History of colonic polyps     1999, 2004  . Allergic  rhinitis   . Thyroid nodule 05/2010    Abnormal biopsy, 77 year old patient and his wife have made informed decision to not pursue surgical resection. Potential risk including cancer has been thoroughly discussed with the patient.  . S/P excision of acoustic neuroma   . History of prostate cancer 2002    s/p treatment with seeds / radiation    Past Surgical History  Procedure Date  . Coronary artery bypass graft   . Transperineal implatation of palladium   . Cholecystectomy   . Shoulder surgery   . Total knee arthroplasty   . Cataract extraction   . Tonsillectomy and adenoidectomy 1932  . Craniectomy for excision of acoustic neuroma 1985  . Cataract extraction 2010    Family History  Problem Relation Age of Onset  . Emphysema Brother   . Alcohol abuse    . Arthritis    . Heart disease Father   . Cancer    . Macular degeneration    . Colon cancer Sister   . Heart disease Brother  History   Social History  . Marital Status: Married    Spouse Name: N/A    Number of Children: N/A  . Years of Education: N/A   Occupational History  . 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 Smoking status: Former Smoker    Types: Cigarettes    Quit date: 01/22/1961  . Smokeless tobacco: Former Neurosurgeon  . Alcohol Use: 0.5 oz/week    1 drink(s) per week  . Drug Use: No  . Sexually Active: Not on file   Other Topics Concern  . Not on file   Social History Narrative  . No narrative on file    ROS: Please see the HPI.  All other systems reviewed and negative.  PHYSICAL EXAM:  BP 132/72  Pulse 68  Resp 18  Ht 5\' 10"  (1.778 m)  Wt 195 lb (88.451 kg)  BMI 27.98 kg/m2  General: Well developed, well nourished, in no acute distress. Head:  Normocephalic and atraumatic. Neck: no JVD.  Carotid bruit.   Lungs: Clear to auscultation and percussion. Heart: Normal S1 and S2.  3/6 SEM.  No DM.   Abdomen:  Normal bowel sounds; soft; non tender; no  organomegaly Pulses: Pulses normal in all 4 extremities. Extremities: No clubbing or cyanosis. No edema. Neurologic: Alert and oriented x 3.  EKG:  NSR. WNL. ASSESSMENT AND PLAN:

## 2010-08-14 NOTE — Assessment & Plan Note (Signed)
He is agreeable to continue to monitor symptoms, and have follow up dopplers continue that a surgical decision is fluid.  They all understand the various approaches, and his daughter is a Administrator, Civil Service who prefers medical approach, and we also feel he would have some increased risk with surgery given his angina, and aortic stenosis.  They are comfortable with a careful follow up with medical therapy approach for his carotid stenosis.  No neuro symptoms at present.

## 2010-08-15 ENCOUNTER — Encounter: Payer: 59 | Admitting: Rehabilitative and Restorative Service Providers"

## 2010-08-15 NOTE — Procedures (Unsigned)
CAROTID DUPLEX EXAM  INDICATION:  Followup carotid artery disease.  HISTORY: Diabetes:  Yes. Cardiac:  Yes. Hypertension:  Yes. Smoking:  Previously. Previous Surgery:  No. CV History:  Currently asymptomatic. Amaurosis Fugax No, Paresthesias No, Hemiparesis No                                      RIGHT             LEFT Brachial systolic pressure:         137               139 Brachial Doppler waveforms:         Normal            Normal Vertebral direction of flow:        Antegrade         Antegrade DUPLEX VELOCITIES (cm/sec) CCA peak systolic                   50                47 ECA peak systolic                   70                101 ICA peak systolic                   94                279 ICA end diastolic                   28                74 PLAQUE MORPHOLOGY:                  Irregular         Irregular PLAQUE AMOUNT:                      Moderate          Severe PLAQUE LOCATION:                    CCA, ICA          ICA, bifurcation  IMPRESSION: 1. Right internal carotid artery velocity suggests 1%-39% stenosis. 2. Left internal carotid artery velocity suggests 60%-79% stenosis. 3. Stable from previous study.  ___________________________________________ V. Charlena Cross, MD  EM/MEDQ  D:  07/31/2010  T:  07/31/2010  Job:  [id number]

## 2010-08-21 ENCOUNTER — Other Ambulatory Visit: Payer: Self-pay | Admitting: Cardiology

## 2010-08-22 ENCOUNTER — Ambulatory Visit: Payer: Medicare Other | Admitting: Physical Therapy

## 2010-08-24 ENCOUNTER — Ambulatory Visit: Payer: Medicare Other | Admitting: Physical Therapy

## 2010-08-29 ENCOUNTER — Ambulatory Visit: Payer: Medicare Other | Attending: Family Medicine | Admitting: Physical Therapy

## 2010-08-29 DIAGNOSIS — R269 Unspecified abnormalities of gait and mobility: Secondary | ICD-10-CM | POA: Insufficient documentation

## 2010-08-29 DIAGNOSIS — IMO0001 Reserved for inherently not codable concepts without codable children: Secondary | ICD-10-CM | POA: Insufficient documentation

## 2010-08-29 DIAGNOSIS — R262 Difficulty in walking, not elsewhere classified: Secondary | ICD-10-CM | POA: Insufficient documentation

## 2010-08-31 ENCOUNTER — Ambulatory Visit: Payer: Medicare Other | Admitting: Physical Therapy

## 2010-09-04 ENCOUNTER — Other Ambulatory Visit: Payer: Self-pay | Admitting: Dermatology

## 2010-09-05 ENCOUNTER — Ambulatory Visit: Payer: Medicare Other | Admitting: Physical Therapy

## 2010-09-07 ENCOUNTER — Ambulatory Visit: Payer: Medicare Other | Admitting: Physical Therapy

## 2010-09-12 ENCOUNTER — Ambulatory Visit: Payer: Medicare Other | Admitting: Physical Therapy

## 2010-09-13 ENCOUNTER — Ambulatory Visit: Payer: Medicare Other | Admitting: Family Medicine

## 2010-09-14 ENCOUNTER — Ambulatory Visit: Payer: Medicare Other | Admitting: Physical Therapy

## 2010-09-27 ENCOUNTER — Ambulatory Visit: Payer: Medicare Other | Attending: Family Medicine | Admitting: Physical Therapy

## 2010-09-27 DIAGNOSIS — R262 Difficulty in walking, not elsewhere classified: Secondary | ICD-10-CM | POA: Insufficient documentation

## 2010-09-27 DIAGNOSIS — R269 Unspecified abnormalities of gait and mobility: Secondary | ICD-10-CM | POA: Insufficient documentation

## 2010-09-27 DIAGNOSIS — IMO0001 Reserved for inherently not codable concepts without codable children: Secondary | ICD-10-CM | POA: Insufficient documentation

## 2010-09-28 ENCOUNTER — Ambulatory Visit: Payer: Medicare Other | Admitting: Physical Therapy

## 2010-10-03 ENCOUNTER — Ambulatory Visit: Payer: Medicare Other | Admitting: Physical Therapy

## 2010-10-05 ENCOUNTER — Ambulatory Visit: Payer: Medicare Other | Admitting: Physical Therapy

## 2010-10-10 ENCOUNTER — Ambulatory Visit: Payer: Medicare Other | Admitting: Physical Therapy

## 2010-10-13 ENCOUNTER — Ambulatory Visit: Payer: Medicare Other | Admitting: Physical Therapy

## 2010-10-17 ENCOUNTER — Ambulatory Visit: Payer: Medicare Other | Admitting: Physical Therapy

## 2010-10-19 ENCOUNTER — Ambulatory Visit: Payer: Medicare Other | Admitting: Physical Therapy

## 2010-10-26 ENCOUNTER — Ambulatory Visit (INDEPENDENT_AMBULATORY_CARE_PROVIDER_SITE_OTHER): Payer: Medicare Other

## 2010-10-26 DIAGNOSIS — Z23 Encounter for immunization: Secondary | ICD-10-CM

## 2010-11-29 ENCOUNTER — Ambulatory Visit (INDEPENDENT_AMBULATORY_CARE_PROVIDER_SITE_OTHER): Payer: Medicare Other | Admitting: Cardiology

## 2010-11-29 ENCOUNTER — Encounter: Payer: Self-pay | Admitting: Cardiology

## 2010-11-29 VITALS — BP 132/74 | HR 79 | Ht 70.0 in | Wt 197.8 lb

## 2010-11-29 DIAGNOSIS — E7800 Pure hypercholesterolemia, unspecified: Secondary | ICD-10-CM

## 2010-11-29 DIAGNOSIS — I25810 Atherosclerosis of coronary artery bypass graft(s) without angina pectoris: Secondary | ICD-10-CM

## 2010-11-29 DIAGNOSIS — I359 Nonrheumatic aortic valve disorder, unspecified: Secondary | ICD-10-CM

## 2010-11-29 NOTE — Patient Instructions (Signed)
Your physician wants you to follow-up in:  6 months. You will receive a reminder letter in the mail two months in advance. If you don't receive a letter, please call our office to schedule the follow-up appointment.   

## 2010-11-29 NOTE — Progress Notes (Signed)
HPI:  Major complaint is that of fatigue.  No new symptoms.  Use of NTG is less per his wife. Their daughter is very sick. He is not having progressive symptoms.  Being followed at this point by Dr. Myra Gianotti in vascular surgery.  Patient after discussion had opted for observation as opposed to surgery which his daughter supported.  They are following him closely.   Current Outpatient Prescriptions  Medication Sig Dispense Refill  . aspirin EC 325 MG EC tablet Take 325 mg by mouth daily.        . B Complex-C-Folic Acid (NEPHRO-VITE PO) Take 1 tablet by mouth daily.        . isosorbide mononitrate (IMDUR) 60 MG 24 hr tablet TAKE 1 TABLET DAILY  90 tablet  2  . Lutein 6 MG CAPS Take by mouth daily.        . Multiple Vitamins-Minerals (RA VISION-VITE PRESERVE PO) Take 2 capsules by mouth daily.        . nitroGLYCERIN (NITROLINGUAL) 0.4 MG/SPRAY spray Place 1 spray under the tongue every 5 (five) minutes as needed.        Marland Kitchen omeprazole (PRILOSEC) 20 MG capsule Take 20 mg by mouth daily.        . pravastatin (PRAVACHOL) 40 MG tablet Take 40 mg by mouth daily.        . Vitamin D, Cholecalciferol, 400 UNITS CHEW Chew 1 tablet by mouth daily.          No Known Allergies  Past Medical History  Diagnosis Date  . HYPERCHOLESTEROLEMIA   . CAD, ARTERY BYPASS GRAFT   . CAROTID ARTERY STENOSIS 03/07/2010    80%  . AORTIC STENOSIS   . GASTROESOPHAGEAL REFLUX DISEASE   . Skin cancer   . History of colonic polyps     1999, 2004  . Allergic rhinitis   . Thyroid nodule 05/2010    Abnormal biopsy, 77 year old patient and his wife have made informed decision to not pursue surgical resection. Potential risk including cancer has been thoroughly discussed with the patient.  . S/P excision of acoustic neuroma   . History of prostate cancer 2002    s/p treatment with seeds / radiation    Past Surgical History  Procedure Date  . Coronary artery bypass graft   . Transperineal implatation of palladium   .  Cholecystectomy   . Shoulder surgery   . Total knee arthroplasty   . Cataract extraction   . Tonsillectomy and adenoidectomy 1932  . Craniectomy for excision of acoustic neuroma 1985  . Cataract extraction 2010    Family History  Problem Relation Age of Onset  . Emphysema Brother   . Alcohol abuse    . Arthritis    . Heart disease Father   . Cancer    . Macular degeneration    . Colon cancer Sister   . Heart disease Brother     History   Social History  . Marital Status: Married    Spouse Name: N/A    Number of Children: N/A  . Years of Education: N/A   Occupational History  . 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 Smoking status: Former Smoker    Types: Cigarettes    Quit date: 01/22/1961  . Smokeless tobacco: Former Neurosurgeon  . Alcohol Use: 0.5 oz/week    1 drink(s) per week  . Drug Use: No  . Sexually  Active: Not on file   Other Topics Concern  . Not on file   Social History Narrative  . No narrative on file    ROS: Please see the HPI.  All other systems reviewed and negative.  PHYSICAL EXAM:  BP 132/74  Pulse 79  Ht 5\' 10"  (1.778 m)  Wt 197 lb 12.8 oz (89.721 kg)  BMI 28.38 kg/m2  General: Well developed, well nourished, in no acute distress. Head:  Normocephalic and atraumatic. Neck: no JVD Lungs: Clear to auscultation and percussion. Heart: Normal S1 and S2. SEM 1-2/6.  No DM.  Abdomen:  Normal bowel sounds; soft; non tender; no organomegaly Pulses: Pulses normal in all 4 extremities. Extremities: No clubbing or cyanosis. No edema. Neurologic: Alert and oriented x 3.  EKG:  NSR.  Nonspecific ST and T wave abnormality inferiorly. Last tracing reviewed.  Changes nonspecific.  ECHO  05-10-10    Study Conclusions  - Left ventricle: Septal and inferobasal hypokinesis The cavity size was mildly dilated. There was moderate concentric hypertrophy. Systolic function was mildly reduced. The estimated  ejection fraction was in the range of 45% to 50%. - Aortic valve: Fusion of right and left cusps There was mild stenosis. - Mitral valve: Mild regurgitation. - Left atrium: The atrium was mildly dilated. - Atrial septum: No defect or patent foramen ovale was identified.     ASSESSMENT AND PLAN:

## 2010-12-10 NOTE — Assessment & Plan Note (Signed)
He is not having all that much angina, does have fatigue, but given age, and lack of progressive symptoms, combined with his issues with cath, recommend continued conservative management.

## 2010-12-10 NOTE — Assessment & Plan Note (Signed)
Taking pravastatin and was at target when measured nine months earlier.   Also tolerating medication.

## 2010-12-10 NOTE — Assessment & Plan Note (Signed)
Not severe at this point and we will continue to watch.

## 2010-12-20 ENCOUNTER — Encounter: Payer: Self-pay | Admitting: Family Medicine

## 2010-12-20 ENCOUNTER — Ambulatory Visit (INDEPENDENT_AMBULATORY_CARE_PROVIDER_SITE_OTHER): Payer: Medicare Other | Admitting: Family Medicine

## 2010-12-20 VITALS — BP 120/74 | HR 78 | Temp 98.2°F | Ht 70.0 in | Wt 197.1 lb

## 2010-12-20 DIAGNOSIS — I359 Nonrheumatic aortic valve disorder, unspecified: Secondary | ICD-10-CM

## 2010-12-20 DIAGNOSIS — Z87891 Personal history of nicotine dependence: Secondary | ICD-10-CM

## 2010-12-20 DIAGNOSIS — K219 Gastro-esophageal reflux disease without esophagitis: Secondary | ICD-10-CM

## 2010-12-20 DIAGNOSIS — E7800 Pure hypercholesterolemia, unspecified: Secondary | ICD-10-CM

## 2010-12-20 DIAGNOSIS — I209 Angina pectoris, unspecified: Secondary | ICD-10-CM

## 2010-12-20 DIAGNOSIS — J309 Allergic rhinitis, unspecified: Secondary | ICD-10-CM

## 2010-12-20 DIAGNOSIS — Z79899 Other long term (current) drug therapy: Secondary | ICD-10-CM

## 2010-12-20 DIAGNOSIS — R0989 Other specified symptoms and signs involving the circulatory and respiratory systems: Secondary | ICD-10-CM

## 2010-12-20 DIAGNOSIS — R0600 Dyspnea, unspecified: Secondary | ICD-10-CM

## 2010-12-20 DIAGNOSIS — R5381 Other malaise: Secondary | ICD-10-CM

## 2010-12-20 DIAGNOSIS — M48061 Spinal stenosis, lumbar region without neurogenic claudication: Secondary | ICD-10-CM

## 2010-12-20 DIAGNOSIS — J449 Chronic obstructive pulmonary disease, unspecified: Secondary | ICD-10-CM

## 2010-12-20 DIAGNOSIS — Z23 Encounter for immunization: Secondary | ICD-10-CM

## 2010-12-20 DIAGNOSIS — N182 Chronic kidney disease, stage 2 (mild): Secondary | ICD-10-CM

## 2010-12-20 LAB — CBC WITH DIFFERENTIAL/PLATELET
Eosinophils Relative: 3.1 % (ref 0.0–5.0)
HCT: 45.6 % (ref 39.0–52.0)
Hemoglobin: 15.4 g/dL (ref 13.0–17.0)
Lymphocytes Relative: 31.4 % (ref 12.0–46.0)
Lymphs Abs: 1.7 10*3/uL (ref 0.7–4.0)
Monocytes Relative: 8.7 % (ref 3.0–12.0)
Neutro Abs: 3.1 10*3/uL (ref 1.4–7.7)
WBC: 5.6 10*3/uL (ref 4.5–10.5)

## 2010-12-20 LAB — BASIC METABOLIC PANEL
BUN: 20 mg/dL (ref 6–23)
Calcium: 8.9 mg/dL (ref 8.4–10.5)
GFR: 55.17 mL/min — ABNORMAL LOW (ref >60.00)
Glucose, Bld: 136 mg/dL — ABNORMAL HIGH (ref 70–99)

## 2010-12-20 LAB — HEPATIC FUNCTION PANEL
AST: 30 U/L (ref 0–37)
Albumin: 3.8 g/dL (ref 3.5–5.2)
Alkaline Phosphatase: 58 U/L (ref 39–117)
Bilirubin, Direct: 0.2 mg/dL (ref 0.0–0.3)
Total Protein: 6.3 g/dL (ref 6.0–8.3)

## 2010-12-20 LAB — VITAMIN B12: Vitamin B-12: 746 pg/mL (ref 211–911)

## 2010-12-20 MED ORDER — ALBUTEROL SULFATE HFA 108 (90 BASE) MCG/ACT IN AERS: 2.0000 | INHALATION_SPRAY | Freq: Four times a day (QID) | RESPIRATORY_TRACT | Status: DC | PRN

## 2010-12-20 MED ORDER — HYDROCODONE-ACETAMINOPHEN 5-500 MG PO TABS: ORAL_TABLET | ORAL | Status: DC

## 2010-12-20 NOTE — Patient Instructions (Signed)
F/u 6 months

## 2010-12-20 NOTE — Progress Notes (Signed)
Patient Name: Andrew [name] Date of Birth: Jan 07, 1924 Age: 77 y.o. Medical Record Number: [id number] Gender: male  History of Present Illness:  Andrew [name] is a 77 y.o. very pleasant male patient who presents with the following:  Tdap (needs Tdap vaccine) Has some questions.   Has had some problems with his back for a number of years. Has had some ESI. Now having some pain with every steps and pain with most steps.  I reviewed his MRI, and the patient does have significant lumbar spinal stenosis at multiple levels. He has very significant spondyloarthropathy and degenerative disc disease at essentially every level in his lumbar spine.  A lot more coughing. Something will trigger a cough and that will ease it right away. Fifteen to thirty seconds will make it better. Right now, he is taking a cough drop, and that is relieving his symptoms. He is not on an ACE inhibitor or an angiotensin receptor blocker. He was a very heavy smoker, 3 pack a day, but distantly quit in the 60s. He has never had any spirometry.  Feels tired all the time. His blood pressures been stable. He does have some angina relatively frequently with activity.   Patient also has a significant thyroid nodule, and has seen both endocrinology and general surgery. The patient ultimately with his wife decided that they did not want to pursue an biopsy of this.  Blood pressures, these are stable.  Lipids: Doing well, stable. Tolerating meds fine with no SE. Panel reviewed with patient.  Lipids:    Component Value Date/Time   CHOL 116 05/06/2009 1048   TRIG 103.0 05/06/2009 1048   HDL 39.60 05/06/2009 1048   LDLDIRECT 67.0 03/13/2010 1453   VLDL 20.6 05/06/2009 1048   CHOLHDL 3 05/06/2009 1048    Lab Results  Component Value Date   ALT 23 12/20/2010   AST 30 12/20/2010   ALKPHOS 58 12/20/2010   BILITOT 1.3* 12/20/2010   Chronic renal insufficiency. Mild. Basic Metabolic Panel:    Component Value Date/Time   NA  142 12/20/2010 1005   K 4.2 12/20/2010 1005   CL 105 12/20/2010 1005   CO2 30 12/20/2010 1005   BUN 20 12/20/2010 1005   CREATININE 1.3 12/20/2010 1005   GLUCOSE 136* 12/20/2010 1005   CALCIUM 8.9 12/20/2010 1005   Today, the patient's creatinine is actually normal.  Past Medical History, Surgical History, Social History, Family History, and Problem List have been reviewed in EHR and updated if relevant.  Review of Systems:  GEN: No acute illnesses, no fevers, chills. GI: No n/v/d, eating normally Pulm: occ sob with exertion, improved with rest or NTG Very significant back pain. Otherwise he is feeling well, getting along with his family well. Overall, he feels more fatigued. Interactive and getting along well at home. Otherwise, the pertinent positives and negatives are listed above and in the HPI, otherwise a full review of systems has been reviewed and is negative unless noted positive.   Physical Examination: Filed Vitals:   12/20/10 0900  BP: 120/74  Pulse: 78  Temp: 98.2 F (36.8 C)  TempSrc: Oral  Height: 5\' 10"  (1.778 m)  Weight: 197 lb 1.9 oz (89.413 kg)  SpO2: 98%     GEN: WDWN, NAD, Non-toxic, A & O x 3 HEENT: Atraumatic, Normocephalic. Neck supple. No masses, No LAD. Ears and Nose: No external deformity. CV: RRR, 3/6 SEM. No JVD. No thrill. No extra heart sounds. PULM: CTA B, no wheezes, crackles, rhonchi.  No retractions. No resp. distress. No accessory muscle use. EXTR: No c/c/e NEURO Normal gait.  PSYCH: Normally interactive. Conversant. Not depressed or anxious appearing.  Calm demeanor.   Spirometry: FVC 3.02, 72% FEV1, 82%  Assessment and Plan:  1. Dyspnea : likely multifactorial. The patient does have significant angina and significant cardiac disease. He does have some mild chronic obstructive pulmonary disease likely is resultant of his very heavy smoking use distantly. He is Gold category a, and should not need any baseline medications. I have  given him a Ventolin inhaler to use when he is sick Spirometry w/ graph, Spirometry w/ graph  2. HYPERCHOLESTEROLEMIA : stable   3. ANGINA PECTORIS, CLASS II : relatively a baseline from where he was over the long haul.   4. AORTIC STENOSIS very compliant with his medication, and following up with his cardiologist   5. ALLERGIC RHINITIS I suspect that he has occasional coughing is multifactorial from some mild chronic obstructive pulmonary disease, and it could be from some allergic rhinitis or reflux as well. Increase Prilosec dosing to twice a day, and if that does not help, then they will have a Zyrtec tablet or Claritin tablet   6. GASTROESOPHAGEAL REFLUX DISEASE    7. RENAL DISEASE, CHRONIC, MILD    8. Lumbar spinal stenosis : when I reviewed his films, he is quite significant lumbar spinal stenosis and diffuse spine changes. The patient is a very poor operative candidate, and I reviewed with him that there is really no good surgical procedure for a large area of stenosis, and a large scale decompression with his multiple medical problems would be a very bad idea. He could have further steroid injections. I think it is also very reasonable for him to have pain medication indefinitely. For right now, we will do some Vicodin on an as-needed basis. He certainly could do well with a very low level fentanyl patch or MS Contin as well.   9. Other malaise and fatigue : suspect multifactorial Basic metabolic panel, CBC with Differential, Vitamin B12, Hepatic function panel, TSH, T4, free  10. History of tobacco abuse    11. Immunization due  Tdap vaccine greater than or equal to 7yo IM    Orders Placed This Encounter  Procedures  . Tdap vaccine greater than or equal to 7yo IM  . Basic metabolic panel  . CBC with Differential  . Vitamin B12  . Hepatic function panel  . TSH  . T4, free  . Spirometry w/ graph    Standing Status: Standing     Number of Occurrences: 1     Standing Expiration Date:       There are no discontinued medications.

## 2010-12-22 ENCOUNTER — Encounter: Payer: Self-pay | Admitting: Family Medicine

## 2010-12-22 DIAGNOSIS — J449 Chronic obstructive pulmonary disease, unspecified: Secondary | ICD-10-CM

## 2010-12-22 HISTORY — DX: Chronic obstructive pulmonary disease, unspecified: J44.9

## 2011-01-10 ENCOUNTER — Ambulatory Visit
Admission: RE | Admit: 2011-01-10 | Discharge: 2011-01-10 | Disposition: A | Discharge status: Home / Self Care | Payer: Medicare Other | Source: Ambulatory Visit | Attending: Surgery | Admitting: Surgery

## 2011-01-10 DIAGNOSIS — E041 Nontoxic single thyroid nodule: Secondary | ICD-10-CM

## 2011-01-16 ENCOUNTER — Other Ambulatory Visit: Payer: Self-pay | Admitting: Cardiology

## 2011-01-18 ENCOUNTER — Ambulatory Visit (INDEPENDENT_AMBULATORY_CARE_PROVIDER_SITE_OTHER): Payer: Medicare Other | Admitting: Surgery

## 2011-01-18 ENCOUNTER — Encounter (INDEPENDENT_AMBULATORY_CARE_PROVIDER_SITE_OTHER): Payer: Self-pay | Admitting: Surgery

## 2011-01-18 VITALS — BP 142/80 | HR 68 | Temp 97.2°F | Resp 12 | Ht 70.0 in | Wt 200.6 lb

## 2011-01-18 DIAGNOSIS — E041 Nontoxic single thyroid nodule: Secondary | ICD-10-CM

## 2011-01-18 NOTE — Progress Notes (Signed)
Visit Diagnoses: 1. THYROID NODULE     HISTORY: Patient is a 77 year old white male followed for thyroid nodule. Patient has had a previous fine-needle aspiration biopsy which did show some cellular atypia. After discussion, the patient and his wife decided against surgical resection. We continue to monitor this nodule closely.  At my request he underwent a followup thyroid ultrasound on January 10, 2011. This was compared to a prior study in February 2012. This shows a small thyroid goiter with a dominant nodule in the lateral portion of the right thyroid lobe. It measures 2.8 cm in greatest dimension which is slightly smaller than the prior study. There is a calcified solid nodule in the upper parathyroid lobe which measures 8 mm. Overall these findings are stable.  PERTINENT REVIEW OF SYSTEMS: Denies tremors. Denies palpitations. Rare episode of dysphagia. Denies any new masses. Denies any pain.  EXAM: HEENT: normocephalic; pupils equal and reactive; sclerae clear; dentition good; mucous membranes moist NECK:  Palpation reveals a slightly enlarged thyroid gland. The thyroid parenchyma is firm. Nodules are noted though with swallowing and nontender; symmetric on extension; no palpable anterior or posterior cervical lymphadenopathy; no supraclavicular masses; no tenderness CHEST: clear to auscultation bilaterally without rales, rhonchi, or wheezes CARDIAC: regular rate and rhythm without significant murmur; peripheral pulses are full EXT:  non-tender without edema; no deformity NEURO: no gross focal deficits; no sign of tremor   IMPRESSION: Dominant right thyroid nodule, 2.8 cm, clinically stable  PLAN: I reviewed the above findings with the patient and his wife. While there is still a risk of malignancy, the clinical course of this nodule appears to be stable and there are no new worrisome findings. At this point I believe we can safely continue to monitor the nodule with sequential  ultrasound scanning and serial physical examination. Patient will return to see me in one year. We will repeat his ultrasound at that time.  Patient and his wife understand the above issues and are in agreement with this plan.  Velora Heckler, MD, FACS General & Endocrine Surgery [facility]

## 2011-02-02 ENCOUNTER — Encounter: Payer: Self-pay | Admitting: Surgery

## 2011-02-05 ENCOUNTER — Other Ambulatory Visit (INDEPENDENT_AMBULATORY_CARE_PROVIDER_SITE_OTHER): Payer: Medicare Other | Admitting: *Deleted

## 2011-02-05 ENCOUNTER — Encounter: Payer: Self-pay | Admitting: Surgery

## 2011-02-05 ENCOUNTER — Ambulatory Visit (INDEPENDENT_AMBULATORY_CARE_PROVIDER_SITE_OTHER): Payer: Medicare Other | Admitting: Surgery

## 2011-02-05 VITALS — BP 140/85 | HR 62 | Resp 16 | Ht 70.0 in | Wt 198.0 lb

## 2011-02-05 DIAGNOSIS — I6529 Occlusion and stenosis of unspecified carotid artery: Secondary | ICD-10-CM

## 2011-02-05 NOTE — Progress Notes (Signed)
Vascular and Vein Specialist of [facility]   Patient name: Andrew [name] MRN: [id number] DOB: August 27, 1923 Sex: male     Chief Complaint  Patient presents with  . Carotid    6 month f/up with Labs    HISTORY OF PRESENT ILLNESS: The patient is here today for further evaluation of his left carotid stenosis. He initially presented with progressive stenosis within the left internal carotid artery. He this was further evaluated with a CT scan because he had a discrepancy in some of the studies. The CT scan found 80% left internal carotid stenosis. In addition there was a ulcerated plaque and mild outpouching on his aortic arch. Other findings on the CT scan were a thyroid nodule which has been evaluated and a lung nodule. The patient is back today. We elected not to intervene on his carotid as he was deemed to be very high risk from a cardiac perspective. In addition he is asymptomatic. He is not a candidate for carotid stenting due to the significant calcification within his aortic arch. He continues to be asymptomatic. He is very active  Past Medical History  Diagnosis Date  . HYPERCHOLESTEROLEMIA   . CAD, ARTERY BYPASS GRAFT   . CAROTID ARTERY STENOSIS 03/07/2010    80%  . AORTIC STENOSIS   . GASTROESOPHAGEAL REFLUX DISEASE   . Skin cancer   . History of colonic polyps     1999, 2004  . Allergic rhinitis   . Thyroid nodule 05/2010    Abnormal biopsy, 78 year old patient and his wife have made informed decision to not pursue surgical resection. Potential risk including cancer has been thoroughly discussed with the patient.  . S/P excision of acoustic neuroma   . History of prostate cancer 2002    s/p treatment with seeds / radiation  . Lumbar spinal stenosis 07/13/2009  . COPD, mild 12/22/2010    Past Surgical History  Procedure Date  . Coronary artery bypass graft   . Transperineal implatation of palladium   . Cholecystectomy   . Shoulder surgery   . Total knee arthroplasty   .  Cataract extraction   . Tonsillectomy and adenoidectomy 1932  . Craniectomy for excision of acoustic neuroma 1985  . Cataract extraction 2010    History   Social History  . Marital Status: Married    Spouse Name: N/A    Number of Children: N/A  . Years of Education: N/A   Occupational History  . 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 Smoking status: Former Smoker    Types: Cigarettes    Quit date: 01/22/1961  . Smokeless tobacco: Former Neurosurgeon  . Alcohol Use: 0.5 oz/week    1 drink(s) per week  . Drug Use: No  . Sexually Active: Not on file   Other Topics Concern  . Not on file   Social History Narrative  . No narrative on file    Family History  Problem Relation Age of Onset  . Emphysema Brother   . Cancer Brother     lung  . Alcohol abuse    . Arthritis    . Cancer    . Macular degeneration    . Heart disease Father   . Colon cancer Sister   . Cancer Sister     non-hodgkins  . Heart disease Brother   . Cancer Sister     colon  . Cancer Sister  skin    Allergies as of 02/05/2011  . (No Known Allergies)    Current Outpatient Prescriptions on File Prior to Visit  Medication Sig Dispense Refill  . aspirin EC 325 MG EC tablet Take 325 mg by mouth daily.        . B Complex-C-Folic Acid (NEPHRO-VITE PO) Take 1 tablet by mouth daily.        . isosorbide mononitrate (IMDUR) 60 MG 24 hr tablet TAKE 1 TABLET DAILY  90 tablet  2  . Lutein 6 MG CAPS Take by mouth daily.        . Multiple Vitamins-Minerals (RA VISION-VITE PRESERVE PO) Take 2 capsules by mouth daily.        . nitroGLYCERIN (NITROLINGUAL) 0.4 MG/SPRAY spray Place 1 spray under the tongue every 5 (five) minutes as needed.        Marland Kitchen omeprazole (PRILOSEC) 20 MG capsule Take 20 mg by mouth daily.        . pravastatin (PRAVACHOL) 40 MG tablet TAKE 1 TABLET DAILY  90 tablet  1  . Vitamin D, Cholecalciferol, 400 UNITS CHEW Chew 1 tablet by mouth daily.        Marland Kitchen  albuterol (VENTOLIN HFA) 108 (90 BASE) MCG/ACT inhaler Inhale 2 puffs into the lungs every 6 (six) hours as needed for wheezing.  1 Inhaler  5  . HYDROcodone-acetaminophen (VICODIN) 5-500 MG per tablet 1/2 tab to 1 tab po every 6 hours if needed for pain  40 tablet  3     REVIEW OF SYSTEMS:   No changes from prior visit  PHYSICAL EXAMINATION:   Vital signs are BP 140/85  Pulse 62  Resp 16  Ht 5\' 10"  (1.778 m)  Wt 198 lb (89.812 kg)  BMI 28.41 kg/m2  SpO2 97% General: The patient appears their stated age. HEENT:  No gross abnormalities Pulmonary:  Non labored breathing Musculoskeletal: There are no major deformities. Neurologic: No focal weakness or paresthesias are detected, Skin: There are no ulcer or rashes noted. Psychiatric: The patient has normal affect. Cardiovascular: There is a regular rate and rhythm without significant murmur appreciated.   Diagnostic Studies Carotid ultrasound was performed today which shows 40-59% right carotid stenosis and 60-79% left carotid stenosis  Assessment: Asymptomatic left carotid stenosis Plan: I do not see any significant change by ultrasound when compared of the patient's study one year ago. Because he remains asymptomatic him because he is high risk from a cardiac perspective I would recommend continued observation. I am recommending a followup ultrasound in 6 months. At that time I will also get a noncontrasted chest CT scan to further evaluate his lung nodule as well as to address this area in his aorta.  Jorge Ny, M.D. [facility] [place] Office: [phone] Pager:  [phone]

## 2011-02-12 NOTE — Procedures (Unsigned)
CAROTID DUPLEX EXAM  INDICATION:  Followup carotid artery disease  HISTORY: Diabetes:  Yes Cardiac:  Yes Hypertension:  Yes Smoking:  Previous Previous Surgery:  No CV History:  Vertigo Amaurosis Fugax No, Paresthesias No, Hemiparesis No                                      RIGHT             LEFT Brachial systolic pressure:         132               142 Brachial Doppler waveforms:         Triphasic         Triphasic Vertebral direction of flow:        Antegrade         Antegrade DUPLEX VELOCITIES (cm/sec) CCA peak systolic                   94                88 ECA peak systolic                   89                85 ICA peak systolic                   123               312 ICA end diastolic                   34                79 PLAQUE MORPHOLOGY:                  Calcific          Mixed PLAQUE AMOUNT:                      Large             Severe PLAQUE LOCATION:                    Bifurcation/ICA   Bifurcation/ICA  IMPRESSION: 1. 40%-59% right internal carotid artery stenosis, lower end of range. 2. 60%-79% left internal carotid artery stenosis, higher end of range.     Increased velocity since prior study of 07/31/2010.  ___________________________________________ V. Charlena Cross, MD  SS/MEDQ  D:  02/06/2011  T:  02/06/2011  Job:  [id number]

## 2011-05-15 ENCOUNTER — Other Ambulatory Visit: Payer: Self-pay | Admitting: Dermatology

## 2011-05-28 ENCOUNTER — Encounter: Payer: Self-pay | Admitting: Cardiology

## 2011-05-28 ENCOUNTER — Ambulatory Visit (INDEPENDENT_AMBULATORY_CARE_PROVIDER_SITE_OTHER): Payer: Medicare Other | Admitting: Cardiology

## 2011-05-28 VITALS — BP 132/76 | HR 74 | Ht 71.0 in | Wt 199.8 lb

## 2011-05-28 DIAGNOSIS — R911 Solitary pulmonary nodule: Secondary | ICD-10-CM

## 2011-05-28 DIAGNOSIS — I359 Nonrheumatic aortic valve disorder, unspecified: Secondary | ICD-10-CM

## 2011-05-28 DIAGNOSIS — I209 Angina pectoris, unspecified: Secondary | ICD-10-CM

## 2011-05-28 DIAGNOSIS — I25810 Atherosclerosis of coronary artery bypass graft(s) without angina pectoris: Secondary | ICD-10-CM

## 2011-05-28 DIAGNOSIS — I6529 Occlusion and stenosis of unspecified carotid artery: Secondary | ICD-10-CM

## 2011-05-28 MED ORDER — PRAVASTATIN SODIUM 40 MG PO TABS: 40.0000 mg | ORAL_TABLET | Freq: Every day | ORAL | Status: DC

## 2011-05-28 MED ORDER — METOPROLOL SUCCINATE ER 25 MG PO TB24: 12.5000 mg | ORAL_TABLET | Freq: Every day | ORAL | Status: DC

## 2011-05-28 MED ORDER — ISOSORBIDE MONONITRATE ER 60 MG PO TB24: 60.0000 mg | ORAL_TABLET | Freq: Every day | ORAL | Status: DC

## 2011-05-28 NOTE — Assessment & Plan Note (Signed)
Follow up CT was ordered, but cannot tell that it was done.

## 2011-05-28 NOTE — Assessment & Plan Note (Signed)
It would seem reasonable to continue a conservative course.  See [facility] January office note.

## 2011-05-28 NOTE — Assessment & Plan Note (Signed)
See angina.

## 2011-05-28 NOTE — Patient Instructions (Signed)
Your physician has recommended you make the following change in your medication: START Metoprolol Succinate 25mg  take one-half tablet by mouth daily  Your physician recommends that you schedule a follow-up appointment in: 3-4 WEEKS with Dr Riley Kill

## 2011-05-28 NOTE — Progress Notes (Signed)
HPI:  Patient is in for follow up.  He is still having angina, and it greatly limits what he does.  We reviewed in detail his anatomy today regarding treatment decisions.  We also reviewed the more conservative course regarding carotid endarterectomy.  His lifestyle is limited, and his wife says he gets easy angina.  No rest symptoms.   Current Outpatient Prescriptions  Medication Sig Dispense Refill  . albuterol (VENTOLIN HFA) 108 (90 BASE) MCG/ACT inhaler Inhale 2 puffs into the lungs every 6 (six) hours as needed for wheezing.  1 Inhaler  5  . aspirin EC 325 MG EC tablet Take 325 mg by mouth daily.        . B Complex-C-Folic Acid (NEPHRO-VITE PO) Take 1 tablet by mouth daily.        Marland Kitchen HYDROcodone-acetaminophen (VICODIN) 5-500 MG per tablet 1/2 tab to 1 tab po every 6 hours if needed for pain  40 tablet  3  . isosorbide mononitrate (IMDUR) 60 MG 24 hr tablet TAKE 1 TABLET DAILY  90 tablet  2  . Lutein 6 MG CAPS Take by mouth daily.        . Multiple Vitamins-Minerals (RA VISION-VITE PRESERVE PO) Take 2 capsules by mouth daily.        . nitroGLYCERIN (NITROLINGUAL) 0.4 MG/SPRAY spray Place 1 spray under the tongue every 5 (five) minutes as needed.        Marland Kitchen omeprazole (PRILOSEC) 20 MG capsule Take 20 mg by mouth daily.        . pravastatin (PRAVACHOL) 40 MG tablet TAKE 1 TABLET DAILY  90 tablet  1  . Vitamin D, Cholecalciferol, 400 UNITS CHEW Chew 1 tablet by mouth daily.          No Known Allergies  Past Medical History  Diagnosis Date  . HYPERCHOLESTEROLEMIA   . CAD, ARTERY BYPASS GRAFT   . CAROTID ARTERY STENOSIS 03/07/2010    80%  . AORTIC STENOSIS   . GASTROESOPHAGEAL REFLUX DISEASE   . Skin cancer   . History of colonic polyps     1999, 2004  . Allergic rhinitis   . Thyroid nodule 05/2010    Abnormal biopsy, 78 year old patient and his wife have made informed decision to not pursue surgical resection. Potential risk including cancer has been thoroughly discussed with the  patient.  . S/P excision of acoustic neuroma   . History of prostate cancer 2002    s/p treatment with seeds / radiation  . Lumbar spinal stenosis 07/13/2009  . COPD, mild 12/22/2010    Past Surgical History  Procedure Date  . Coronary artery bypass graft   . Transperineal implatation of palladium   . Cholecystectomy   . Shoulder surgery   . Total knee arthroplasty   . Cataract extraction   . Tonsillectomy and adenoidectomy 1932  . Craniectomy for excision of acoustic neuroma 1985  . Cataract extraction 2010    Family History  Problem Relation Age of Onset  . Emphysema Brother   . Cancer Brother     lung  . Alcohol abuse    . Arthritis    . Cancer    . Macular degeneration    . Heart disease Father   . Colon cancer Sister   . Cancer Sister     non-hodgkins  . Heart disease Brother   . Cancer Sister     colon  . Cancer Sister     skin    History  Social History  . Marital Status: Married    Spouse Name: N/A    Number of Children: N/A  . Years of Education: N/A   Occupational History  . 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 Smoking status: Former Smoker    Types: Cigarettes    Quit date: 01/22/1961  . Smokeless tobacco: Former Neurosurgeon  . Alcohol Use: 0.5 oz/week    1 drink(s) per week  . Drug Use: No  . Sexually Active: Not on file   Other Topics Concern  . Not on file   Social History Narrative  . No narrative on file    ROS: Please see the HPI.  All other systems reviewed and negative.  PHYSICAL EXAM:  BP 132/76  Pulse 74  Ht 5\' 11"  (1.803 m)  Wt 199 lb 12.8 oz (90.629 kg)  BMI 27.87 kg/m2  General: Well developed, well nourished, in no acute distress. Head:  Normocephalic and atraumatic. Neck: no JVD Lungs: Clear to auscultation and percussion. Heart: Normal S1 and S2.  Preserved S2.  1-2/6 SEM.  No DM.   Pulses: Pulses normal in all 4 extremities. Extremities: No clubbing or cyanosis. No  edema. Neurologic: Alert and oriented x 3.  EKG:  NSR.  Minimal voltage criteria for LVH.  Non specific ST abnormality.   ASSESSMENT AND PLAN:

## 2011-05-28 NOTE — Assessment & Plan Note (Signed)
This may be class III.  He is fairly limited.  We could consider cath but I have reservations  (prior anatomy did not suggest good targets for PCI, he had atheroemboli post procedure, and he is not a reop candidate in my opinion.  We good add Ranexa to his regimen. Because his HR is 74, and BP is normal, we will add low dose metoprolol xl to see if this helps.  I will see him back in a few weeks.

## 2011-05-28 NOTE — Assessment & Plan Note (Signed)
Mild.

## 2011-06-01 ENCOUNTER — Telehealth: Payer: Self-pay

## 2011-06-01 NOTE — Telephone Encounter (Signed)
  Caller: Andrew [name]/Patient; PCP: Juleen China.; CB#: [phone]; ;Call regarding Cough/Congestion that started 1 week ago and can feel it deep in his chest.  SX started 1 week ago.  Afebrile. Traiged Cough Adult and pt has a HX of CAD and worsening SX x 1 week.  Pt states "it feels like my chest is trying to shut down on me".  Disp = needs to  be seen in the E/R.  Called ofc and spoke with Rena and inst pt needs to be seen in the E/R.  Inst given to pt.

## 2011-06-01 NOTE — Telephone Encounter (Signed)
At 4:40 pm Aram Beecham with [facility] said pt had non productive cough for 1 week, no wheezing, no fever and no chest pain now but SOB upon exertion. Pt is 88 and has hx of coronary artery disease. Pt last seen 12/20/10.Pt advised to go to ER for evaluation. Aram Beecham will advise pt.

## 2011-06-02 ENCOUNTER — Ambulatory Visit (INDEPENDENT_AMBULATORY_CARE_PROVIDER_SITE_OTHER): Payer: Medicare Other | Admitting: Family Medicine

## 2011-06-02 ENCOUNTER — Encounter: Payer: Self-pay | Admitting: Family Medicine

## 2011-06-02 ENCOUNTER — Ambulatory Visit: Payer: Medicare Other

## 2011-06-02 VITALS — BP 130/71 | HR 75 | Temp 98.0°F | Resp 16 | Ht 67.75 in | Wt 197.4 lb

## 2011-06-02 DIAGNOSIS — R05 Cough: Secondary | ICD-10-CM

## 2011-06-02 DIAGNOSIS — R0602 Shortness of breath: Secondary | ICD-10-CM

## 2011-06-02 DIAGNOSIS — R0989 Other specified symptoms and signs involving the circulatory and respiratory systems: Secondary | ICD-10-CM

## 2011-06-02 DIAGNOSIS — R059 Cough, unspecified: Secondary | ICD-10-CM

## 2011-06-02 LAB — POCT CBC
Granulocyte percent: 80.3 % — AB (ref 37–80)
HCT, POC: 47 % (ref 43.5–53.7)
Hemoglobin: 14.9 g/dL (ref 14.1–18.1)
Lymph, poc: 1.4 (ref 0.6–3.4)
MCH, POC: 32.7 pg — AB (ref 27–31.2)
MCHC: 31.7 g/dL — AB (ref 31.8–35.4)
MCV: 103.3 fL — AB (ref 80–97)
MID (cbc): 0.3 (ref 0–0.9)
MPV: 11.4 fL (ref 0–99.8)
POC Granulocyte: 6.9 (ref 2–6.9)
POC LYMPH PERCENT: 15.9 %L (ref 10–50)
POC MID %: 3.8 % (ref 0–12)
Platelet Count, POC: 131 10*3/uL — AB (ref 142–424)
RBC: 4.55 M/uL — AB (ref 4.69–6.13)
RDW, POC: 13.1 %
WBC: 8.6 10*3/uL (ref 4.6–10.2)

## 2011-06-02 MED ORDER — BENZONATATE 100 MG PO CAPS: 200.0000 mg | ORAL_CAPSULE | Freq: Two times a day (BID) | ORAL | Status: AC | PRN

## 2011-06-02 MED ORDER — METHYLPREDNISOLONE 4 MG PO KIT: PACK | ORAL | Status: AC

## 2011-06-02 NOTE — Progress Notes (Signed)
[facility]:  Office Visit  Chief Complaint:  Chief Complaint  Patient presents with  . Cough    dry wheezing x 2 - 3 days uses an inhaler    HPI: Andrew [name] is a 78 y.o. male who complains of  2-3 day history of white productive sputum, Chest coungestion, wheezing last night and SOB. Wife gave him albuterol inh and also her pills - prednisone 10 mg x 2. Had night sweats. History of CAD.   Past Medical History  Diagnosis Date  . HYPERCHOLESTEROLEMIA   . CAD, ARTERY BYPASS GRAFT   . CAROTID ARTERY STENOSIS 03/07/2010    80%  . AORTIC STENOSIS   . GASTROESOPHAGEAL REFLUX DISEASE   . Skin cancer   . History of colonic polyps     1999, 2004  . Allergic rhinitis   . Thyroid nodule 05/2010    Abnormal biopsy, 78 year old patient and his wife have made informed decision to not pursue surgical resection. Potential risk including cancer has been thoroughly discussed with the patient.  . S/P excision of acoustic neuroma   . History of prostate cancer 2002    s/p treatment with seeds / radiation  . Lumbar spinal stenosis 07/13/2009  . COPD, mild 12/22/2010   Past Surgical History  Procedure Date  . Coronary artery bypass graft   . Transperineal implatation of palladium   . Cholecystectomy   . Shoulder surgery   . Total knee arthroplasty   . Cataract extraction   . Tonsillectomy and adenoidectomy 1932  . Craniectomy for excision of acoustic neuroma 1985  . Cataract extraction 2010   History   Social History  . Marital Status: Married    Spouse Name: N/A    Number of Children: N/A  . Years of Education: N/A   Occupational History  . 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 Smoking status: Former Smoker    Types: Cigarettes    Quit date: 01/22/1961  . Smokeless tobacco: Former Neurosurgeon  . Alcohol Use: 0.5 oz/week    1 drink(s) per week  . Drug Use: No  . Sexually Active: None   Other Topics Concern  . None    Social History Narrative  . None   Family History  Problem Relation Age of Onset  . Emphysema Brother   . Cancer Brother     lung  . Alcohol abuse    . Arthritis    . Cancer    . Macular degeneration    . Heart disease Father   . Colon cancer Sister   . Cancer Sister     non-hodgkins  . Heart disease Brother   . Cancer Sister     colon  . Cancer Sister     skin   No Known Allergies Prior to Admission medications   Medication Sig Start Date End Date Taking? Authorizing Provider  albuterol (VENTOLIN HFA) 108 (90 BASE) MCG/ACT inhaler Inhale 2 puffs into the lungs every 6 (six) hours as needed for wheezing. 12/20/10 12/20/11 Yes Spencer Copland, MD  aspirin EC 325 MG EC tablet Take 325 mg by mouth daily.     Yes Historical Provider, MD  B Complex-C-Folic Acid (NEPHRO-VITE PO) Take 1 tablet by mouth daily.     Yes Historical Provider, MD  isosorbide mononitrate (IMDUR) 60 MG 24 hr tablet Take 1 tablet (60 mg total) by mouth daily. 05/28/11  Yes  Herby Abraham, MD  Lutein 6 MG CAPS Take by mouth daily.     Yes Historical Provider, MD  metoprolol succinate (TOPROL-XL) 25 MG 24 hr tablet Take 0.5 tablets (12.5 mg total) by mouth daily. 05/28/11 05/27/12 Yes Herby Abraham, MD  Multiple Vitamins-Minerals (RA VISION-VITE PRESERVE PO) Take 2 capsules by mouth daily.     Yes Historical Provider, MD  nitroGLYCERIN (NITROLINGUAL) 0.4 MG/SPRAY spray Place 1 spray under the tongue every 5 (five) minutes as needed.     Yes Historical Provider, MD  omeprazole (PRILOSEC) 20 MG capsule Take 20 mg by mouth daily.     Yes Historical Provider, MD  pravastatin (PRAVACHOL) 40 MG tablet Take 1 tablet (40 mg total) by mouth daily. 05/28/11  Yes Herby Abraham, MD  Vitamin D, Cholecalciferol, 400 UNITS CHEW Chew 1 tablet by mouth daily.     Yes Historical Provider, MD  HYDROcodone-acetaminophen (VICODIN) 5-500 MG per tablet 1/2 tab to 1 tab po every 6 hours if needed for pain 12/20/10   Hannah Beat,  MD     ROS: The patient denies fevers, chills, night sweats, unintentional weight loss, chest pain, palpitations, wheezing, dyspnea on exertion, nausea, vomiting, abdominal pain, dysuria, hematuria, melena, numbness, weakness, or tingling.  All other systems have been reviewed and were otherwise negative with the exception of those mentioned in the HPI and as above.    PHYSICAL EXAM: Filed Vitals:   06/02/11 0835  BP: 130/71  Pulse: 75  Temp: 98 F (36.7 C)  Resp: 16  Spo2 95% Filed Vitals:   06/02/11 0835  Height: 5' 7.75" (1.721 m)  Weight: 197 lb 6.4 oz (89.54 kg)   Body mass index is 30.24 kg/(m^2).  General: Alert, no acute distress HEENT:  Normocephalic, atraumatic, oropharynx patent. TM nl. + sinus tendermness on left max Cardiovascular:  Regular rate and rhythm, no rubs murmurs or gallops.  No Carotid bruits, radial pulse intact. No pedal edema.  Respiratory: Clear to auscultation bilaterally.  No wheezes, rales, or rhonchi.  No cyanosis, no use of accessory musculature GI: No organomegaly, abdomen is soft and non-tender, positive bowel sounds.  No masses. Skin: No rashes. Neurologic: Facial musculature symmetric. Psychiatric: Patient is appropriate throughout our interaction. Lymphatic: No cervical lymphadenopathy Musculoskeletal: Gait intact.   LABS: Results for orders placed in visit on 06/02/11  POCT CBC      Component Value Range   WBC 8.6  4.6 - 10.2 (K/uL)   Lymph, poc 1.4  0.6 - 3.4    POC LYMPH PERCENT 15.9  10 - 50 (%L)   MID (cbc) 0.3  0 - 0.9    POC MID % 3.8  0 - 12 (%M)   POC Granulocyte 6.9  2 - 6.9    Granulocyte percent 80.3 (*) 37 - 80 (%G)   RBC 4.55 (*) 4.69 - 6.13 (M/uL)   Hemoglobin 14.9  14.1 - 18.1 (g/dL)   HCT, POC 40.9  81.1 - 53.7 (%)   MCV 103.3 (*) 80 - 97 (fL)   MCH, POC 32.7 (*) 27 - 31.2 (pg)   MCHC 31.7 (*) 31.8 - 35.4 (g/dL)   RDW, POC 91.4     Platelet Count, POC 131 (*) 142 - 424 (K/uL)   MPV 11.4  0 - 99.8 (fL)      EKG/XRAY:   Primary read interpreted by Dr. Conley Rolls at [facility]. CXR-compared to 09/2007. No pneumo, no infiltrate, no effusion.    ASSESSMENT/PLAN: Encounter Diagnoses  Name Primary?  . SOB (shortness of breath) Yes  . Cough   . Chest congestion    ? Early sinusitis with cough and h/o tobacco use Continue with albuterol INH prn for SOB/Wheezing Rx: Tessalon Perles  for cough Rx: Z-pack prn if sxs worsen and no improvement with sxs management , Depomerol pack prn ( will call with xray results before start z-pack if needed, also will call to see if patient needs steroids---depends on how he does overnight if wheezing worsens or stays the same. In the office he does not have wheezing, however wife states that he was extremely wheezy last night even after albuterol inhaler such that she had to give him 2 of her Prednisone 10 mg pills. )     Lipa Knauff PHUONG, DO 06/02/2011 10:00 AM

## 2011-06-03 ENCOUNTER — Telehealth: Payer: Self-pay | Admitting: Family Medicine

## 2011-06-03 NOTE — Telephone Encounter (Signed)
Agree with poc °

## 2011-06-03 NOTE — Telephone Encounter (Signed)
Spoke with patient's wife about negative CXR results. He wheeze just a tiny bit last night, recommedn sxs txt with albuterol and cough medicine and to hold medrol dose pack since not wheezing heavily like he did the night before. Hold z-pack as well.

## 2011-06-19 ENCOUNTER — Encounter: Payer: Self-pay | Admitting: Cardiology

## 2011-06-19 ENCOUNTER — Ambulatory Visit (INDEPENDENT_AMBULATORY_CARE_PROVIDER_SITE_OTHER): Payer: Medicare Other | Admitting: Cardiology

## 2011-06-19 VITALS — BP 130/62 | HR 69 | Resp 18 | Ht 70.0 in | Wt 195.1 lb

## 2011-06-19 DIAGNOSIS — R911 Solitary pulmonary nodule: Secondary | ICD-10-CM

## 2011-06-19 DIAGNOSIS — I25810 Atherosclerosis of coronary artery bypass graft(s) without angina pectoris: Secondary | ICD-10-CM

## 2011-06-19 DIAGNOSIS — I6529 Occlusion and stenosis of unspecified carotid artery: Secondary | ICD-10-CM

## 2011-06-19 MED ORDER — METOPROLOL SUCCINATE ER 25 MG PO TB24: 12.5000 mg | ORAL_TABLET | Freq: Every day | ORAL | Status: DC

## 2011-06-19 NOTE — Assessment & Plan Note (Signed)
Continued follow up with Dr. Myra Gianotti.

## 2011-06-19 NOTE — Assessment & Plan Note (Signed)
Has CT scan coming up from [facility].

## 2011-06-19 NOTE — Patient Instructions (Signed)
Your physician recommends that you schedule a follow-up appointment in: 2 MONTHS  Your physician recommends that you continue on your current medications as directed. Please refer to the Current Medication list given to you today.   

## 2011-06-19 NOTE — Assessment & Plan Note (Addendum)
Overall he seems to be improved with the use of beta blockers.  He could experiment with his dose up to a full 25 mg dose to see if this helps even more.  I told him if his P is less than 60, then he should probably cut back.  He will see Dr.Copland tomorrow.

## 2011-06-19 NOTE — Progress Notes (Signed)
HPI:  The patient is doing well.  Since starting the low dose metoprolol, he has been able to do more.  He denies any chest pain.  He was down at [facility] in [place], and able to walk without to much trouble.  He thinks it has helped a lot.    Current Outpatient Prescriptions  Medication Sig Dispense Refill  . albuterol (VENTOLIN HFA) 108 (90 BASE) MCG/ACT inhaler Inhale 2 puffs into the lungs every 6 (six) hours as needed for wheezing.  1 Inhaler  5  . aspirin EC 325 MG EC tablet Take 325 mg by mouth daily.        . B Complex-C-Folic Acid (NEPHRO-VITE PO) Take 1 tablet by mouth daily.        . isosorbide mononitrate (IMDUR) 60 MG 24 hr tablet Take 1 tablet (60 mg total) by mouth daily.  90 tablet  3  . Lutein 6 MG CAPS Take by mouth daily.        . metoprolol succinate (TOPROL-XL) 25 MG 24 hr tablet Take 0.5 tablets (12.5 mg total) by mouth daily.  45 tablet  3  . Multiple Vitamins-Minerals (RA VISION-VITE PRESERVE PO) Take 2 capsules by mouth daily.        . nitroGLYCERIN (NITROLINGUAL) 0.4 MG/SPRAY spray Place 1 spray under the tongue every 5 (five) minutes as needed.        Marland Kitchen omeprazole (PRILOSEC) 20 MG capsule Take 20 mg by mouth daily.        . pravastatin (PRAVACHOL) 40 MG tablet Take 1 tablet (40 mg total) by mouth daily.  90 tablet  3  . Vitamin D, Cholecalciferol, 400 UNITS CHEW Chew 1 tablet by mouth daily.        Marland Kitchen DISCONTD: metoprolol succinate (TOPROL-XL) 25 MG 24 hr tablet Take 0.5 tablets (12.5 mg total) by mouth daily.  30 tablet  2  . HYDROcodone-acetaminophen (VICODIN) 5-500 MG per tablet 1/2 tab to 1 tab po every 6 hours if needed for pain  40 tablet  3    No Known Allergies  Past Medical History  Diagnosis Date  . HYPERCHOLESTEROLEMIA   . CAD, ARTERY BYPASS GRAFT   . CAROTID ARTERY STENOSIS 03/07/2010    80%  . AORTIC STENOSIS   . GASTROESOPHAGEAL REFLUX DISEASE   . Skin cancer   . History of colonic polyps     1999, 2004  . Allergic rhinitis   . Thyroid  nodule 05/2010    Abnormal biopsy, 78 year old patient and his wife have made informed decision to not pursue surgical resection. Potential risk including cancer has been thoroughly discussed with the patient.  . S/P excision of acoustic neuroma   . History of prostate cancer 2002    s/p treatment with seeds / radiation  . Lumbar spinal stenosis 07/13/2009  . COPD, mild 12/22/2010    Past Surgical History  Procedure Date  . Coronary artery bypass graft   . Transperineal implatation of palladium   . Cholecystectomy   . Shoulder surgery   . Total knee arthroplasty   . Cataract extraction   . Tonsillectomy and adenoidectomy 1932  . Craniectomy for excision of acoustic neuroma 1985  . Cataract extraction 2010    Family History  Problem Relation Age of Onset  . Emphysema Brother   . Cancer Brother     lung  . Alcohol abuse    . Arthritis    . Cancer    . Macular degeneration    .  Heart disease Father   . Colon cancer Sister   . Cancer Sister     non-hodgkins  . Heart disease Brother   . Cancer Sister     colon  . Cancer Sister     skin    History   Social History  . Marital Status: Married    Spouse Name: N/A    Number of Children: N/A  . Years of Education: N/A   Occupational History  . 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 Smoking status: Former Smoker    Types: Cigarettes    Quit date: 01/22/1961  . Smokeless tobacco: Former Neurosurgeon  . Alcohol Use: 0.5 oz/week    1 drink(s) per week  . Drug Use: No  . Sexually Active: Not on file   Other Topics Concern  . Not on file   Social History Narrative  . No narrative on file    ROS: Please see the HPI.  All other systems reviewed and negative.  PHYSICAL EXAM:  BP 130/62  Pulse 69  Resp 18  Ht 5\' 10"  (1.778 m)  Wt 195 lb 1.9 oz (88.506 kg)  BMI 28.00 kg/m2  General: Well developed, well nourished, in no acute distress. Head:  Normocephalic and atraumatic. Neck:  no JVD Lungs: Clear to auscultation and percussion. Heart: Normal S1 and S2. 2/6 SEM.  No DM.   Pulses: Pulses normal in all 4 extremities. Extremities: No clubbing or cyanosis. No edema. Neurologic: Alert and oriented x 3.  EKG:  Not done today.   ASSESSMENT AND PLAN:

## 2011-06-20 ENCOUNTER — Encounter: Payer: Self-pay | Admitting: Family Medicine

## 2011-06-20 ENCOUNTER — Ambulatory Visit (INDEPENDENT_AMBULATORY_CARE_PROVIDER_SITE_OTHER): Payer: Medicare Other | Admitting: Family Medicine

## 2011-06-20 VITALS — BP 138/78 | HR 76 | Temp 98.1°F | Wt 193.0 lb

## 2011-06-20 DIAGNOSIS — N289 Disorder of kidney and ureter, unspecified: Secondary | ICD-10-CM

## 2011-06-20 DIAGNOSIS — J189 Pneumonia, unspecified organism: Secondary | ICD-10-CM

## 2011-06-20 DIAGNOSIS — J441 Chronic obstructive pulmonary disease with (acute) exacerbation: Secondary | ICD-10-CM

## 2011-06-20 MED ORDER — DOXYCYCLINE HYCLATE 100 MG PO TABS: 100.0000 mg | ORAL_TABLET | Freq: Two times a day (BID) | ORAL | Status: AC

## 2011-06-20 MED ORDER — PREDNISONE 20 MG PO TABS: ORAL_TABLET | ORAL | Status: DC

## 2011-06-20 NOTE — Progress Notes (Signed)
Patient Name: Andrew [name] Date of Birth: [birth year] Medical Record Number: [id number] Gender: male Date of Encounter: 06/20/2011  History of Present Illness:  Andrew [name] is a 78 y.o. very pleasant male patient who presents with the following:  The patient has had some significant shortness of breath and productive cough productive of sputum for approximately 3 weeks. He did get to [facility] and was given a short dose of some Medrol Dosepak. He did improve for a short period, but now he is having some worsening shortness of breath and cough productive of sputum. Is also having worsening shortness of breath with lying on his RIGHT side.  Had f/u with cards. He denies having chest pain. Going to see dr. Myra Gianotti upcoming soon. He does have a known carotid lesion. Carotid artery stenosis.  He did have [facility] and this was normal Currently he is having to use his inhaler approximately 3-4 times a day and previously before he was sick he was using this very rarely   Lipids:    Component Value Date/Time   CHOL 116 05/06/2009 1048   TRIG 103.0 05/06/2009 1048   HDL 39.60 05/06/2009 1048   LDLDIRECT 67.0 03/13/2010 1453   VLDL 20.6 05/06/2009 1048   CHOLHDL 3 05/06/2009 1048    CBC:    Component Value Date/Time   WBC 8.6 06/02/2011 0917   WBC 5.6 12/20/2010 1005   HGB 14.9 06/02/2011 0917   HGB 15.4 12/20/2010 1005   HCT 47.0 06/02/2011 0917   HCT 45.6 12/20/2010 1005   PLT 109.0* 12/20/2010 1005   MCV 103.3* 06/02/2011 0917   MCV 103.4* 12/20/2010 1005   NEUTROABS 3.1 12/20/2010 1005   LYMPHSABS 1.7 12/20/2010 1005   MONOABS 0.5 12/20/2010 1005   EOSABS 0.2 12/20/2010 1005   BASOSABS 0.0 12/20/2010 1005    Basic Metabolic Panel:    Component Value Date/Time   NA 141 06/20/2011 1454   K 4.7 06/20/2011 1454   CL 107 06/20/2011 1454   CO2 29 06/20/2011 1454   BUN 21 06/20/2011 1454   CREATININE 1.3 06/20/2011 1454   GLUCOSE 85 06/20/2011 1454   CALCIUM 8.8  06/20/2011 1454    Lab Results  Component Value Date   ALT 23 12/20/2010   AST 30 12/20/2010   ALKPHOS 58 12/20/2010   BILITOT 1.3* 12/20/2010     Patient Active Problem List  Diagnoses  . HYPERCHOLESTEROLEMIA  . ANGINA PECTORIS, CLASS II  . CAD, ARTERY BYPASS GRAFT  . AORTIC STENOSIS  . ALLERGIC RHINITIS  . GASTROESOPHAGEAL REFLUX DISEASE  . RENAL DISEASE, CHRONIC, MILD  . Lumbar spinal stenosis  . COLONIC POLYPS, HX OF  . THYROID NODULE  . CAROTID ARTERY STENOSIS  . Vertigo  . Pulmonary nodule  . S/P excision of acoustic neuroma  . COPD, mild   Past Medical History  Diagnosis Date  . HYPERCHOLESTEROLEMIA   . CAD, ARTERY BYPASS GRAFT   . CAROTID ARTERY STENOSIS 03/07/2010    80%  . AORTIC STENOSIS   . GASTROESOPHAGEAL REFLUX DISEASE   . Skin cancer   . History of colonic polyps     1999, 2004  . Allergic rhinitis   . Thyroid nodule 05/2010    Abnormal biopsy, 78 year old patient and his wife have made informed decision to not pursue surgical resection. Potential risk including cancer has been thoroughly discussed with the patient.  . S/P excision of acoustic neuroma   . History of prostate cancer  2002    s/p treatment with seeds / radiation  . Lumbar spinal stenosis 07/13/2009  . COPD, mild 12/22/2010   Past Surgical History  Procedure Date  . Coronary artery bypass graft   . Transperineal implatation of palladium   . Cholecystectomy   . Shoulder surgery   . Total knee arthroplasty   . Cataract extraction   . Tonsillectomy and adenoidectomy 1932  . Craniectomy for excision of acoustic neuroma 1985  . Cataract extraction 2010   History  Substance Use Topics  . Smoking status: Former Smoker    Types: Cigarettes    Quit date: 01/22/1961  . Smokeless tobacco: Former Neurosurgeon  . Alcohol Use: 0.5 oz/week    1 drink(s) per week   Family History  Problem Relation Age of Onset  . Emphysema Brother   . Cancer Brother     lung  . Alcohol abuse    . Arthritis     . Cancer    . Macular degeneration    . Heart disease Father   . Colon cancer Sister   . Cancer Sister     non-hodgkins  . Heart disease Brother   . Cancer Sister     colon  . Cancer Sister     skin   No Known Allergies  Medication list has been reviewed and updated.  Review of Systems: ROS: GEN: Acute illness details above GI: Tolerating PO intake GU: maintaining adequate hydration and urination Pulm: as above Interactive and getting along well at home.  Otherwise, ROS is as per the HPI.   Physical Examination: Filed Vitals:   06/20/11 1420  BP: 138/78  Pulse: 76  Temp: 98.1 F (36.7 C)  Weight: 193 lb (87.544 kg)    There is no height on file to calculate BMI.   GEN: A and O x 3. WDWN. NAD.    ENT: Nose clear, ext NML.  No LAD.  No JVD.  TM's clear. Oropharynx clear.  PULM: Normal WOB, no distress. Crackles R lung base CV: 3/6 SEM, rrr   EXT: warm and well-perfused, No c/c/e. PSYCH: Pleasant and conversant.   Assessment and Plan:  1. COPD exacerbation    2. Renal insufficiency  Basic metabolic panel  3. Walking pneumonia     Clinical concern for RIGHT lower lobe pneumonia versus walking pneumonia. We'll treat with doxycycline and prednisone.  If he is not improving in the next 10-14 days, I will attain a CT of his chest to evaluate.  Followup renal insufficiency BMP  Orders Today: Orders Placed This Encounter  Procedures  . Basic metabolic panel    Medications Today: Meds ordered this encounter  Medications  . doxycycline (VIBRA-TABS) 100 MG tablet    Sig: Take 1 tablet (100 mg total) by mouth 2 (two) times daily.    Dispense:  20 tablet    Refill:  0  . predniSONE (DELTASONE) 20 MG tablet    Sig: 2 tabs po daily for 5 days, then 1 po daily for 5 days    Dispense:  15 tablet    Refill:  0

## 2011-06-21 LAB — BASIC METABOLIC PANEL
CO2: 29 mEq/L (ref 19–32)
Calcium: 8.8 mg/dL (ref 8.4–10.5)
Chloride: 107 mEq/L (ref 96–112)
Potassium: 4.7 mEq/L (ref 3.5–5.1)
Sodium: 141 mEq/L (ref 135–145)

## 2011-07-02 ENCOUNTER — Ambulatory Visit: Payer: Medicare Other | Admitting: Family Medicine

## 2011-07-05 ENCOUNTER — Encounter: Payer: Self-pay | Admitting: Family Medicine

## 2011-07-05 ENCOUNTER — Ambulatory Visit (INDEPENDENT_AMBULATORY_CARE_PROVIDER_SITE_OTHER): Payer: Medicare Other | Admitting: Family Medicine

## 2011-07-05 VITALS — BP 130/60 | HR 63 | Temp 98.7°F | Ht 70.0 in | Wt 185.5 lb

## 2011-07-05 DIAGNOSIS — J4489 Other specified chronic obstructive pulmonary disease: Secondary | ICD-10-CM

## 2011-07-05 DIAGNOSIS — J189 Pneumonia, unspecified organism: Secondary | ICD-10-CM

## 2011-07-05 DIAGNOSIS — J449 Chronic obstructive pulmonary disease, unspecified: Secondary | ICD-10-CM

## 2011-07-05 NOTE — Patient Instructions (Addendum)
F/u november

## 2011-07-05 NOTE — Progress Notes (Signed)
Nature conservation officer at [facility] [address] [place] [address] Phone: [phone] Fax: [phone]   Patient Name: Andrew [name] Date of Birth: Oct 03, 1923 Age: 78 y.o. Medical Record Number: [id number] Gender: male Date of Encounter: 07/05/2011  History of Present Illness:  Andrew [name] is a 78 y.o. very pleasant male patient who presents with the following:  Pleasant gentleman here in followup, two-week followup after COPD exacerbation. Treated with some prednisone. He is also been on some antibiotics, and done well. He is feeling very well this point he is not having any significant wheezing, and essentially feels normal.  Past Medical History, Surgical History, Social History, Family History, Problem List, Medications, and Allergies have been reviewed and updated if relevant.  Prior to Admission medications   Medication Sig Start Date End Date Taking? Authorizing Provider  albuterol (VENTOLIN HFA) 108 (90 BASE) MCG/ACT inhaler Inhale 2 puffs into the lungs every 6 (six) hours as needed for wheezing. 12/20/10 12/20/11 Yes Nour Scalise, MD  aspirin EC 325 MG EC tablet Take 325 mg by mouth daily.     Yes Historical Provider, MD  B Complex-C-Folic Acid (NEPHRO-VITE PO) Take 1 tablet by mouth daily.     Yes Historical Provider, MD  HYDROcodone-acetaminophen (VICODIN) 5-500 MG per tablet 1/2 tab to 1 tab po every 6 hours if needed for pain 12/20/10  Yes Hannah Beat, MD  isosorbide mononitrate (IMDUR) 60 MG 24 hr tablet Take 1 tablet (60 mg total) by mouth daily. 05/28/11  Yes Herby Abraham, MD  Lutein 6 MG CAPS Take by mouth daily.     Yes Historical Provider, MD  metoprolol succinate (TOPROL-XL) 25 MG 24 hr tablet Take 0.5 tablets (12.5 mg total) by mouth daily. 06/19/11 06/18/12 Yes Herby Abraham, MD  Multiple Vitamins-Minerals (RA VISION-VITE PRESERVE PO) Take 2 capsules by mouth daily.     Yes Historical Provider, MD  nitroGLYCERIN (NITROLINGUAL) 0.4 MG/SPRAY  spray Place 1 spray under the tongue every 5 (five) minutes as needed.     Yes Historical Provider, MD  omeprazole (PRILOSEC) 20 MG capsule Take 20 mg by mouth daily.     Yes Historical Provider, MD  pravastatin (PRAVACHOL) 40 MG tablet Take 1 tablet (40 mg total) by mouth daily. 05/28/11  Yes Herby Abraham, MD  predniSONE (DELTASONE) 20 MG tablet 2 tabs po daily for 5 days, then 1 po daily for 5 days 06/20/11  Yes Hannah Beat, MD  Vitamin D, Cholecalciferol, 400 UNITS CHEW Chew 1 tablet by mouth daily.     Yes Historical Provider, MD    Review of Systems:  GEN: No acute illnesses, no fevers, chills. GI: No n/v/d, eating normally Pulm: No SOB Interactive and getting along well at home.  Otherwise, ROS is as per the HPI.   Physical Examination: Filed Vitals:   07/05/11 1132  BP: 130/60  Pulse: 63  Temp: 98.7 F (37.1 C)   Filed Vitals:   07/05/11 1132  Height: 5\' 10"  (1.778 m)  Weight: 185 lb 8 oz (84.142 kg)   Body mass index is 26.62 kg/(m^2). Ideal Body Weight: Weight in (lb) to have BMI = 25: 173.9    GEN: WDWN, NAD, Non-toxic, A & O x 3 HEENT: Atraumatic, Normocephalic. Neck supple. No masses, No LAD. Ears and Nose: No external deformity. CV: RRR, 3/6 sem. No JVD. No thrill. No extra heart sounds. PULM: r lung base with crackles, improved compared to prior. No wheezing. EXTR: No c/c/e  NEURO Normal gait.  PSYCH: Normally interactive. Conversant. Not depressed or anxious appearing.  Calm demeanor.    Assessment and Plan:  1. COPD, mild   2. Pneumonia     Improved copd  Most c/w resolving pna  r crackles - has upcoming chest CT in the upcoming few weeks which will better characterize if still present  Hannah Beat, MD

## 2011-07-16 ENCOUNTER — Encounter: Payer: Self-pay | Admitting: Family Medicine

## 2011-07-16 ENCOUNTER — Ambulatory Visit (INDEPENDENT_AMBULATORY_CARE_PROVIDER_SITE_OTHER): Payer: Medicare Other | Admitting: Family Medicine

## 2011-07-16 VITALS — BP 151/69 | HR 70 | Temp 98.2°F | Wt 196.5 lb

## 2011-07-16 DIAGNOSIS — W57XXXA Bitten or stung by nonvenomous insect and other nonvenomous arthropods, initial encounter: Secondary | ICD-10-CM | POA: Insufficient documentation

## 2011-07-16 DIAGNOSIS — T148XXA Other injury of unspecified body region, initial encounter: Secondary | ICD-10-CM

## 2011-07-16 NOTE — Assessment & Plan Note (Signed)
But without evidence of infection.  ?retained part, removed with thin nosed forceps.  Dressed with abx ointment and bandaid.  discussed red flags to notify us immediately.

## 2011-07-16 NOTE — Progress Notes (Signed)
  Subjective:    Patient ID: ELONZO SOPP, male    DOB: 1923-11-15, 78 y.o.   MRN: [id number]  HPI CC: tick bite.  First noticed spot on left leg on Thursday.  Removed Friday evening, noticed it was a tick.  Unsure if fully got all parts out.    Using neosporin abx cream over weekend.  bp elevated but recently stressed because or pending kidney MRI.  No fevers/chills, HA, neck stiffness, joint pains, rashes, abd pain/ n/v.  Review of Systems Per HPI    Objective:   Physical Exam NAD, CM Left lower leg with anterior shin erythematous rash surrounding tick bite, about 2cm diameter.  Dark scab vs foreign object in center of lesion.    Assessment & Plan:

## 2011-07-16 NOTE — Patient Instructions (Signed)
Dress bite mark with antibiotic ointment and bandaid for next several days. Watch for any of symptoms we discussed, if develop call us. Fever >101, rash, joint pain, headache or neck stiffness or confusion.

## 2011-08-03 ENCOUNTER — Encounter: Payer: Self-pay | Admitting: Surgery

## 2011-08-06 ENCOUNTER — Ambulatory Visit
Admission: RE | Admit: 2011-08-06 | Discharge: 2011-08-06 | Disposition: A | Discharge status: Home / Self Care | Payer: Medicare Other | Source: Ambulatory Visit | Attending: Surgery | Admitting: Surgery

## 2011-08-06 ENCOUNTER — Other Ambulatory Visit (INDEPENDENT_AMBULATORY_CARE_PROVIDER_SITE_OTHER): Payer: Medicare Other | Admitting: *Deleted

## 2011-08-06 ENCOUNTER — Ambulatory Visit (INDEPENDENT_AMBULATORY_CARE_PROVIDER_SITE_OTHER): Payer: Medicare Other | Admitting: Surgery

## 2011-08-06 ENCOUNTER — Encounter: Payer: Self-pay | Admitting: Surgery

## 2011-08-06 VITALS — BP 134/63 | HR 62 | Temp 98.2°F | Ht 70.0 in | Wt 196.0 lb

## 2011-08-06 DIAGNOSIS — I6529 Occlusion and stenosis of unspecified carotid artery: Secondary | ICD-10-CM

## 2011-08-06 NOTE — Progress Notes (Signed)
[facility]   Patient name: Andrew [name] MRN: [id number] DOB: 1923/11/04 Sex: male     Chief Complaint  Patient presents with  . Carotid    6 month f/u CT prior    HISTORY OF PRESENT ILLNESS: The patient is back today for followup of his carotid occlusive disease.   Last saw him he has not had any symptoms of TIA or stroke. Specifically, he denies numbness or weakness in either extremity, slurred speech, or amaurosis fugax. A small lung nodule was seen on a CT scan a year ago and he was to have followup for that today.  Past Medical History  Diagnosis Date  . HYPERCHOLESTEROLEMIA   . CAD, ARTERY BYPASS GRAFT   . CAROTID ARTERY STENOSIS 03/07/2010    80%  . AORTIC STENOSIS   . GASTROESOPHAGEAL REFLUX DISEASE   . Skin cancer   . History of colonic polyps     1999, 2004  . Allergic rhinitis   . Thyroid nodule 05/2010    Abnormal biopsy, 78 year old patient and his wife have made informed decision to not pursue surgical resection. Potential risk including cancer has been thoroughly discussed with the patient.  . S/P excision of acoustic neuroma   . History of prostate cancer 2002    s/p treatment with seeds / radiation  . Lumbar spinal stenosis 07/13/2009  . COPD, mild 12/22/2010    Past Surgical History  Procedure Date  . Coronary artery bypass graft   . Transperineal implatation of palladium   . Cholecystectomy   . Shoulder surgery   . Total knee arthroplasty   . Cataract extraction   . Tonsillectomy and adenoidectomy 1932  . Craniectomy for excision of acoustic neuroma 1985  . Cataract extraction 2010    History   Social History  . Marital Status: Married    Spouse Name: N/A    Number of Children: N/A  . Years of Education: N/A   Occupational History  . 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 Smoking status: Former Smoker    Types: Cigarettes    Quit date: 01/22/1961  . Smokeless tobacco:  Former Neurosurgeon  . Alcohol Use: 0.5 oz/week    1 drink(s) per week     rarely  . Drug Use: No  . Sexually Active: Not on file   Other Topics Concern  . Not on file   Social History Narrative  . No narrative on file    Family History  Problem Relation Age of Onset  . Emphysema Brother   . Cancer Brother     lung  . Alcohol abuse    . Arthritis    . Cancer    . Macular degeneration    . Heart disease Father   . Colon cancer Sister   . Cancer Sister     non-hodgkins  . Heart disease Brother   . Cancer Sister     colon  . Cancer Sister     skin    Allergies as of 08/06/2011  . (No Known Allergies)    Current Outpatient Prescriptions on File Prior to Visit  Medication Sig Dispense Refill  . albuterol (VENTOLIN HFA) 108 (90 BASE) MCG/ACT inhaler Inhale 2 puffs into the lungs every 6 (six) hours as needed for wheezing.  1 Inhaler  5  . aspirin EC 325 MG EC tablet Take 325 mg by mouth daily.        Marland Kitchen  B Complex-C-Folic Acid (NEPHRO-VITE PO) Take 1 tablet by mouth daily.        Marland Kitchen HYDROcodone-acetaminophen (VICODIN) 5-500 MG per tablet 1/2 tab to 1 tab po every 6 hours if needed for pain  40 tablet  3  . isosorbide mononitrate (IMDUR) 60 MG 24 hr tablet Take 1 tablet (60 mg total) by mouth daily.  90 tablet  3  . Lutein 6 MG CAPS Take by mouth daily.        . metoprolol succinate (TOPROL-XL) 25 MG 24 hr tablet Take 0.5 tablets (12.5 mg total) by mouth daily.  45 tablet  3  . Multiple Vitamins-Minerals (RA VISION-VITE PRESERVE PO) Take 2 capsules by mouth daily.        . nitroGLYCERIN (NITROLINGUAL) 0.4 MG/SPRAY spray Place 1 spray under the tongue every 5 (five) minutes as needed.        Marland Kitchen omeprazole (PRILOSEC) 20 MG capsule Take 20 mg by mouth daily.        . pravastatin (PRAVACHOL) 40 MG tablet Take 1 tablet (40 mg total) by mouth daily.  90 tablet  3  . Vitamin D, Cholecalciferol, 400 UNITS CHEW Chew 1 tablet by mouth daily.        . predniSONE (DELTASONE) 20 MG tablet 2 tabs  po daily for 5 days, then 1 po daily for 5 days  15 tablet  0     REVIEW OF SYSTEMS: No change from prior visit  PHYSICAL EXAMINATION:   Vital signs are BP 134/63  Pulse 62  Temp 98.2 F (36.8 C) (Oral)  Ht 5\' 10"  (1.778 m)  Wt 196 lb (88.905 kg)  BMI 28.12 kg/m2  SpO2 98% General: The patient appears their stated age. HEENT:  No gross abnormalities Pulmonary:  Non labored breathing Musculoskeletal: There are no major deformities. Neurologic: No focal weakness or paresthesias are detected, Skin: There are no ulcer or rashes noted. Psychiatric: The patient has normal affect. Cardiovascular: There is a regular rate and rhythm without significant murmur appreciated.   Diagnostic Studies I have ordered and reviewed his carotid ultrasound. This remained stable. The right-sided stenosis is in the 40-59% range the left-sided stenosis is in the 60-79% range  CT scan of the chest shows a stable appearance of the lung nodule and therefore is considered benign per their report Assessment: Asymptomatic carotid stenosis Plan: The patient has been stable and asymptomatic since I last saw him. He has been deemed not to be a candidate for carotid stenting do to his calcified aortic arch. He is a high risk patient from an open endarterectomy procedure due to his cardiac comorbidities. Therefore, we have been following his carotid occlusive disease and managing him medically. His stenosis remained stable on ultrasound today and therefore I will plan on having him come back in 6 months. Obviously if he develops symptoms this point to change and we would need to proceed with intervention.  Jorge Ny, M.D. [facility] [place] Office: [phone] Pager:  [phone]

## 2011-08-20 ENCOUNTER — Ambulatory Visit (INDEPENDENT_AMBULATORY_CARE_PROVIDER_SITE_OTHER): Payer: Medicare Other | Admitting: Cardiology

## 2011-08-20 ENCOUNTER — Encounter: Payer: Self-pay | Admitting: Cardiology

## 2011-08-20 VITALS — BP 118/70 | HR 62 | Ht 70.5 in | Wt 197.2 lb

## 2011-08-20 DIAGNOSIS — I25810 Atherosclerosis of coronary artery bypass graft(s) without angina pectoris: Secondary | ICD-10-CM

## 2011-08-20 DIAGNOSIS — I359 Nonrheumatic aortic valve disorder, unspecified: Secondary | ICD-10-CM

## 2011-08-20 DIAGNOSIS — I6529 Occlusion and stenosis of unspecified carotid artery: Secondary | ICD-10-CM

## 2011-08-20 DIAGNOSIS — E7800 Pure hypercholesterolemia, unspecified: Secondary | ICD-10-CM

## 2011-08-20 NOTE — Assessment & Plan Note (Signed)
He remains stable and his angina is improved to class II.  We will not make changes at this point.

## 2011-08-20 NOTE — Assessment & Plan Note (Signed)
Has not had recent lipid.

## 2011-08-20 NOTE — Procedures (Unsigned)
CAROTID DUPLEX EXAM  INDICATION:  Followup carotid arterial disease  HISTORY: Diabetes:  No Cardiac:  CABG 1991, angina Hypertension:  No Smoking:  Previous Previous Surgery:  None CV History:  None, macular degeneration Amaurosis Fugax No, Paresthesias No, Hemiparesis No                                      RIGHT             LEFT Brachial systolic pressure:         130               130 Brachial Doppler waveforms:         Biphasic          Biphasic Vertebral direction of flow:        Antegrade         Antegrade DUPLEX VELOCITIES (cm/sec) CCA peak systolic                   123               99 ECA peak systolic                   112               96 ICA peak systolic                   151               357 ICA end diastolic                   31                97 PLAQUE MORPHOLOGY:                  Calcific, irregular                 Heterogeneous PLAQUE AMOUNT:                      Large             Severe PLAQUE LOCATION:                    Bifurcation/ICA, CCA D              Bifurcation/ICA  IMPRESSION: 1. 40%-59% right internal carotid artery stenosis. 2. 60%-79% left internal carotid artery stenosis, upper end of range. 3. Increased velocities since prior study of 02/05/2011.  ___________________________________________ V. Charlena Cross, MD  SS/MEDQ  D:  08/06/2011  T:  08/06/2011  Job:  [id number]

## 2011-08-20 NOTE — Assessment & Plan Note (Signed)
MIld at present. Will continue to monitor and get echo at end of year.

## 2011-08-20 NOTE — Progress Notes (Signed)
HPI:  The patient is seen in followup. Cardiac wise he is doing pretty well. He has had no progression in his anginal symptoms. The beta blockers to help substantially. He said trace amount of edema in his previous surgical site in the left lower extremity. He's also seen vascular surgery recently with regard followup of his carotid this is been stable the plan is to continue to treat him medically at the present time.  His last lipid panel was canceled for unclear reasons  Current Outpatient Prescriptions  Medication Sig Dispense Refill  . albuterol (VENTOLIN HFA) 108 (90 BASE) MCG/ACT inhaler Inhale 2 puffs into the lungs every 6 (six) hours as needed for wheezing.  1 Inhaler  5  . aspirin 325 MG tablet Take 325 mg by mouth daily.      . B Complex-C-Folic Acid (NEPHRO-VITE PO) Take 1 tablet by mouth daily.        . cholecalciferol (VITAMIN D) 400 UNITS TABS Take 400 Units by mouth.      Marland Kitchen HYDROcodone-acetaminophen (VICODIN) 5-500 MG per tablet 1/2 tab to 1 tab po every 6 hours if needed for pain  40 tablet  3  . isosorbide mononitrate (IMDUR) 60 MG 24 hr tablet Take 1 tablet (60 mg total) by mouth daily.  90 tablet  3  . Lutein 6 MG CAPS Take by mouth daily.        . metoprolol succinate (TOPROL-XL) 25 MG 24 hr tablet Take 0.5 tablets (12.5 mg total) by mouth daily.  45 tablet  3  . Multiple Vitamins-Minerals (RA VISION-VITE PRESERVE PO) Take 2 capsules by mouth daily.        . nitroGLYCERIN (NITROLINGUAL) 0.4 MG/SPRAY spray Place 1 spray under the tongue every 5 (five) minutes as needed.        Marland Kitchen omeprazole (PRILOSEC) 20 MG capsule Take 20 mg by mouth daily.        . pravastatin (PRAVACHOL) 40 MG tablet Take 1 tablet (40 mg total) by mouth daily.  90 tablet  3    No Known Allergies  Past Medical History  Diagnosis Date  . HYPERCHOLESTEROLEMIA   . CAD, ARTERY BYPASS GRAFT   . CAROTID ARTERY STENOSIS 03/07/2010    80%  . AORTIC STENOSIS   . GASTROESOPHAGEAL REFLUX DISEASE   . Skin  cancer   . History of colonic polyps     1999, 2004  . Allergic rhinitis   . Thyroid nodule 05/2010    Abnormal biopsy, 78 year old patient and his wife have made informed decision to not pursue surgical resection. Potential risk including cancer has been thoroughly discussed with the patient.  . S/P excision of acoustic neuroma   . History of prostate cancer 2002    s/p treatment with seeds / radiation  . Lumbar spinal stenosis 07/13/2009  . COPD, mild 12/22/2010    Past Surgical History  Procedure Date  . Coronary artery bypass graft   . Transperineal implatation of palladium   . Cholecystectomy   . Shoulder surgery   . Total knee arthroplasty   . Cataract extraction   . Tonsillectomy and adenoidectomy 1932  . Craniectomy for excision of acoustic neuroma 1985  . Cataract extraction 2010    Family History  Problem Relation Age of Onset  . Emphysema Brother   . Cancer Brother     lung  . Alcohol abuse    . Arthritis    . Cancer    . Macular degeneration    .  Heart disease Father   . Colon cancer Sister   . Cancer Sister     non-hodgkins  . Heart disease Brother   . Cancer Sister     colon  . Cancer Sister     skin    History   Social History  . Marital Status: Married    Spouse Name: N/A    Number of Children: N/A  . Years of Education: N/A   Occupational History  . 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 Smoking status: Former Smoker    Types: Cigarettes    Quit date: 01/22/1961  . Smokeless tobacco: Never Used  . Alcohol Use: 0.5 oz/week    1 drink(s) per week     rarely  . Drug Use: No  . Sexually Active: Not on file   Other Topics Concern  . Not on file   Social History Narrative  . No narrative on file    ROS: Please see the HPI.  All other systems reviewed and negative.  PHYSICAL EXAM:  BP 118/70  Pulse 62  Ht 5' 10.5" (1.791 m)  Wt 197 lb 3.2 oz (89.449 kg)  BMI 27.90 kg/m2  General: Well  developed, well nourished, in no acute distress. Head:  Normocephalic and atraumatic. Neck: no JVD.  Left carotid bruit.   Lungs: Clear to auscultation and percussion. Heart: Normal S1 and S2. 2-3/6  SEM.  No DM.   Pulses: Pulses normal in all 4 extremities. Extremities: No clubbing or cyanosis. No edema. Neurologic: Alert and oriented x 3.  EKG:  ASSESSMENT AND PLAN:

## 2011-08-20 NOTE — Patient Instructions (Addendum)
Your physician recommends that you schedule a follow-up appointment in: 3 MONTHS with Dr Riley Kill  Your physician recommends that you continue on your current medications as directed. Please refer to the Current Medication list given to you today.  Your physician recommends that you return for a FASTING LIPID and LIVER profile--nothing to eat or drink after midnight, lab opens at 8:30 (08/23/11)

## 2011-08-20 NOTE — Assessment & Plan Note (Signed)
See recent note of Dr. Myra Gianotti.  Will continue to watch at this point in time.

## 2011-08-23 ENCOUNTER — Other Ambulatory Visit (INDEPENDENT_AMBULATORY_CARE_PROVIDER_SITE_OTHER): Payer: Medicare Other

## 2011-08-23 DIAGNOSIS — I25810 Atherosclerosis of coronary artery bypass graft(s) without angina pectoris: Secondary | ICD-10-CM

## 2011-08-23 DIAGNOSIS — I359 Nonrheumatic aortic valve disorder, unspecified: Secondary | ICD-10-CM

## 2011-08-23 DIAGNOSIS — E7800 Pure hypercholesterolemia, unspecified: Secondary | ICD-10-CM

## 2011-08-23 DIAGNOSIS — I6529 Occlusion and stenosis of unspecified carotid artery: Secondary | ICD-10-CM

## 2011-08-23 LAB — LIPID PANEL
HDL: 53.5 mg/dL (ref >39.00)
Total CHOL/HDL Ratio: 3

## 2011-08-23 LAB — HEPATIC FUNCTION PANEL
Alkaline Phosphatase: 52 U/L (ref 39–117)
Bilirubin, Direct: 0.2 mg/dL (ref 0.0–0.3)
Total Protein: 6.2 g/dL (ref 6.0–8.3)

## 2011-11-13 ENCOUNTER — Ambulatory Visit (INDEPENDENT_AMBULATORY_CARE_PROVIDER_SITE_OTHER): Payer: Medicare Other | Admitting: Cardiology

## 2011-11-13 ENCOUNTER — Encounter: Payer: Self-pay | Admitting: Cardiology

## 2011-11-13 VITALS — BP 120/76 | HR 61 | Ht 70.0 in | Wt 197.4 lb

## 2011-11-13 DIAGNOSIS — I25810 Atherosclerosis of coronary artery bypass graft(s) without angina pectoris: Secondary | ICD-10-CM

## 2011-11-13 DIAGNOSIS — I359 Nonrheumatic aortic valve disorder, unspecified: Secondary | ICD-10-CM

## 2011-11-13 DIAGNOSIS — E7800 Pure hypercholesterolemia, unspecified: Secondary | ICD-10-CM

## 2011-11-13 DIAGNOSIS — I6529 Occlusion and stenosis of unspecified carotid artery: Secondary | ICD-10-CM

## 2011-11-13 NOTE — Assessment & Plan Note (Signed)
LDL was at target at last check.

## 2011-11-13 NOTE — Assessment & Plan Note (Signed)
Stable symptoms.  Continue medical therapy.

## 2011-11-13 NOTE — Assessment & Plan Note (Signed)
This is being followed at [facility].

## 2011-11-13 NOTE — Progress Notes (Signed)
HPI:  The patient is in for followup. From a cardiac standpoint he is been nice and stable. However, his macular degeneration is worsened. His vision has deteriorated somewhat. He's not having much in the way of angina. Overall, his functional status remains good. He does sleep more than he has in the past.  Current Outpatient Prescriptions  Medication Sig Dispense Refill  . albuterol (VENTOLIN HFA) 108 (90 BASE) MCG/ACT inhaler Inhale 2 puffs into the lungs every 6 (six) hours as needed for wheezing.  1 Inhaler  5  . aspirin 325 MG tablet Take 325 mg by mouth daily.      . B Complex-C-Folic Acid (NEPHRO-VITE PO) Take 1 tablet by mouth daily.        . cholecalciferol (VITAMIN D) 400 UNITS TABS Take 400 Units by mouth.      . isosorbide mononitrate (IMDUR) 60 MG 24 hr tablet Take 1 tablet (60 mg total) by mouth daily.  90 tablet  3  . Lutein 6 MG CAPS Take by mouth daily.        . metoprolol succinate (TOPROL-XL) 25 MG 24 hr tablet Take 0.5 tablets (12.5 mg total) by mouth daily.  45 tablet  3  . Multiple Vitamins-Minerals (RA VISION-VITE PRESERVE PO) Take 2 capsules by mouth daily.        . nitroGLYCERIN (NITROLINGUAL) 0.4 MG/SPRAY spray Place 1 spray under the tongue every 5 (five) minutes as needed.        Marland Kitchen omeprazole (PRILOSEC) 20 MG capsule Take 20 mg by mouth daily.        . pravastatin (PRAVACHOL) 40 MG tablet Take 1 tablet (40 mg total) by mouth daily.  90 tablet  3    No Known Allergies  Past Medical History  Diagnosis Date  . HYPERCHOLESTEROLEMIA   . CAD, ARTERY BYPASS GRAFT   . CAROTID ARTERY STENOSIS 03/07/2010    80%  . AORTIC STENOSIS   . GASTROESOPHAGEAL REFLUX DISEASE   . Skin cancer   . History of colonic polyps     1999, 2004  . Allergic rhinitis   . Thyroid nodule 05/2010    Abnormal biopsy, 78 year old patient and his wife have made informed decision to not pursue surgical resection. Potential risk including cancer has been thoroughly discussed with the patient.    . S/P excision of acoustic neuroma   . History of prostate cancer 2002    s/p treatment with seeds / radiation  . Lumbar spinal stenosis 07/13/2009  . COPD, mild 12/22/2010    Past Surgical History  Procedure Date  . Coronary artery bypass graft   . Transperineal implatation of palladium   . Cholecystectomy   . Shoulder surgery   . Total knee arthroplasty   . Cataract extraction   . Tonsillectomy and adenoidectomy 1932  . Craniectomy for excision of acoustic neuroma 1985  . Cataract extraction 2010    Family History  Problem Relation Age of Onset  . Emphysema Brother   . Cancer Brother     lung  . Alcohol abuse    . Arthritis    . Cancer    . Macular degeneration    . Heart disease Father   . Colon cancer Sister   . Cancer Sister     non-hodgkins  . Heart disease Brother   . Cancer Sister     colon  . Cancer Sister     skin    History   Social History  .  Marital Status: Married    Spouse Name: N/A    Number of Children: N/A  . Years of Education: N/A   Occupational History  . 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 Smoking status: Former Smoker    Types: Cigarettes    Quit date: 01/22/1961  . Smokeless tobacco: Never Used  . Alcohol Use: 0.5 oz/week    1 drink(s) per week     rarely  . Drug Use: No  . Sexually Active: Not on file   Other Topics Concern  . Not on file   Social History Narrative  . No narrative on file    ROS: Please see the HPI.  All other systems reviewed and negative.  PHYSICAL EXAM:  BP 120/76  Pulse 61  Ht 5\' 10"  (1.778 m)  Wt 197 lb 6.4 oz (89.54 kg)  BMI 28.32 kg/m2  General: Well developed, well nourished, in no acute distress. Head:  Normocephalic and atraumatic. Neck: no JVD Lungs: Clear to auscultation and percussion. Heart: Normal S1 and S2.  2/6 SEM.  No DM.   Abdomen:  Normal bowel sounds; soft; non tender; no organomegaly Pulses: Pulses normal in all 4  extremities. Extremities: No clubbing or cyanosis. No edema. Neurologic: Alert and oriented x 3.  EKG:  NSR.  Leftward axis.  Nonspecific T abnormality.    ASSESSMENT AND PLAN:

## 2011-11-13 NOTE — Patient Instructions (Addendum)
Your physician wants you to follow-up in: 4 MONTHS. You will receive a reminder letter in the mail two months in advance. If you don't receive a letter, please call our office to schedule the follow-up appointment.  Your physician recommends that you continue on your current medications as directed. Please refer to the Current Medication list given to you today.  Your physician has requested that you have an echocardiogram in 4 MONTHS. Echocardiography is a painless test that uses sound waves to create images of your heart. It provides your doctor with information about the size and shape of your heart and how well your heart's chambers and valves are working. This procedure takes approximately one hour. There are no restrictions for this procedure.

## 2011-11-13 NOTE — Assessment & Plan Note (Signed)
The patient will likely need a repeat echocardiogram as he seen back in followup.  His last echocardiogram was in 2012. At that time he had a moderate gradient. He's not particularly symptomatic at this point in time, and he be a poor candidate for any type of procedure. The patient's had atheroemboli in the past, and he is 78 years old with a variety of other medical issues. We will readdress when I see him back.

## 2011-11-16 NOTE — Addendum Note (Signed)
Addended by: Iona Coach on: 11/16/2011 04:23 PM   Modules accepted: Orders

## 2011-11-26 ENCOUNTER — Encounter: Payer: Self-pay | Admitting: Family Medicine

## 2011-11-28 ENCOUNTER — Other Ambulatory Visit (INDEPENDENT_AMBULATORY_CARE_PROVIDER_SITE_OTHER): Payer: Medicare Other

## 2011-11-28 DIAGNOSIS — Z79899 Other long term (current) drug therapy: Secondary | ICD-10-CM

## 2011-11-28 LAB — CBC WITH DIFFERENTIAL/PLATELET
Basophils Relative: 0.4 % (ref 0.0–3.0)
Hemoglobin: 16.3 g/dL (ref 13.0–17.0)
Lymphocytes Relative: 37.5 % (ref 12.0–46.0)
Monocytes Relative: 9.7 % (ref 3.0–12.0)
Neutro Abs: 3 10*3/uL (ref 1.4–7.7)
RBC: 4.76 Mil/uL (ref 4.22–5.81)

## 2011-11-28 LAB — BASIC METABOLIC PANEL
CO2: 30 mEq/L (ref 19–32)
Calcium: 9 mg/dL (ref 8.4–10.5)
GFR: 54.57 mL/min — ABNORMAL LOW (ref >60.00)
Sodium: 141 mEq/L (ref 135–145)

## 2011-12-05 ENCOUNTER — Encounter: Payer: Medicare Other | Admitting: Family Medicine

## 2011-12-05 ENCOUNTER — Other Ambulatory Visit: Payer: Medicare Other

## 2011-12-12 ENCOUNTER — Other Ambulatory Visit: Payer: Self-pay | Admitting: *Deleted

## 2011-12-12 MED ORDER — NITROGLYCERIN 0.4 MG/SPRAY TL SOLN: 1.0000 | Status: DC | PRN

## 2011-12-17 ENCOUNTER — Encounter: Payer: Self-pay | Admitting: Family Medicine

## 2011-12-17 ENCOUNTER — Ambulatory Visit (INDEPENDENT_AMBULATORY_CARE_PROVIDER_SITE_OTHER): Payer: Medicare Other | Admitting: Family Medicine

## 2011-12-17 VITALS — BP 130/80 | HR 57 | Temp 98.1°F | Ht 70.0 in | Wt 201.5 lb

## 2011-12-17 DIAGNOSIS — Z0000 Encounter for general adult medical examination without abnormal findings: Secondary | ICD-10-CM

## 2011-12-17 NOTE — Progress Notes (Signed)
Nature conservation officer at [facility] [address] [place] [address] Phone: [phone] Fax: [phone]  Date:  12/17/2011   Name:  Andrew [name]   DOB:  Jun 17, 1923   MRN:  [id number] Gender: male Age: 78 y.o.  PCP:  Hannah Beat, MD  Evaluating MD: Hannah Beat, MD   Chief Complaint: Annual Exam   History of Present Illness:  Andrew [name] is a 78 y.o. pleasant patient who presents with the following:  Preventative Health Maintenance Visit:  Health Maintenance Summary Reviewed and updated, unless pt declines services.  Tobacco History Reviewed. Alcohol: No concerns, no excessive use Exercise Habits: limited due to back pain Drug Use: None  Health Maintenance  Topic Date Due  . Colonoscopy  03/04/1975  . Influenza Vaccine  09/23/2011  . Tetanus/tdap  12/19/2020  . Pneumococcal Polysaccharide Vaccine Age 62 And Over  Addressed  . Zostavax  Completed    Labs reviewed with the patient.  Results for orders placed in visit on 11/28/11  BASIC METABOLIC PANEL      Component Value Range   Sodium 141  135 - 145 mEq/L   Potassium 4.4  3.5 - 5.1 mEq/L   Chloride 105  96 - 112 mEq/L   CO2 30  19 - 32 mEq/L   Glucose, Bld 92  70 - 99 mg/dL   BUN 20  6 - 23 mg/dL   Creatinine, Ser 1.3  0.4 - 1.5 mg/dL   Calcium 9.0  8.4 - 86.5 mg/dL   GFR 78.46 (*) >96.29 mL/min  CBC WITH DIFFERENTIAL      Component Value Range   WBC 6.0  4.5 - 10.5 K/uL   RBC 4.76  4.22 - 5.81 Mil/uL   Hemoglobin 16.3  13.0 - 17.0 g/dL   HCT 52.8  41.3 - 24.4 %   MCV 102.8 (*) 78.0 - 100.0 fl   MCHC 33.3  30.0 - 36.0 g/dL   RDW 01.0  27.2 - 53.6 %   Platelets 97.0 (*) 150.0 - 400.0 K/uL   Neutrophils Relative 49.1  43.0 - 77.0 %   Lymphocytes Relative 37.5  12.0 - 46.0 %   Monocytes Relative 9.7  3.0 - 12.0 %   Eosinophils Relative 3.3  0.0 - 5.0 %   Basophils Relative 0.4  0.0 - 3.0 %   Neutro Abs 3.0  1.4 - 7.7 K/uL   Lymphs Abs 2.3  0.7 - 4.0 K/uL   Monocytes Absolute 0.6  0.1  - 1.0 K/uL   Eosinophils Absolute 0.2  0.0 - 0.7 K/uL   Basophils Absolute 0.0  0.0 - 0.1 K/uL   Lipids:    Component Value Date/Time   CHOL 136 08/23/2011 1017   TRIG 82.0 08/23/2011 1017   HDL 53.50 08/23/2011 1017   LDLDIRECT 67.0 03/13/2010 1453   VLDL 16.4 08/23/2011 1017   CHOLHDL 3 08/23/2011 1017     Patient Active Problem List  Diagnosis  . HYPERCHOLESTEROLEMIA  . ANGINA PECTORIS, CLASS II  . CAD, ARTERY BYPASS GRAFT  . AORTIC STENOSIS  . ALLERGIC RHINITIS  . GASTROESOPHAGEAL REFLUX DISEASE  . RENAL DISEASE, CHRONIC, MILD  . Lumbar spinal stenosis  . COLONIC POLYPS, HX OF  . THYROID NODULE  . CAROTID ARTERY STENOSIS  . Vertigo  . Pulmonary nodule  . S/P excision of acoustic neuroma  . COPD, mild  . Tick bite    Past Medical History  Diagnosis Date  . HYPERCHOLESTEROLEMIA   . CAD,  ARTERY BYPASS GRAFT   . CAROTID ARTERY STENOSIS 03/07/2010    80%  . AORTIC STENOSIS   . GASTROESOPHAGEAL REFLUX DISEASE   . Skin cancer   . History of colonic polyps     1999, 2004  . Allergic rhinitis   . Thyroid nodule 05/2010    Abnormal biopsy, 78 year old patient and his wife have made informed decision to not pursue surgical resection. Potential risk including cancer has been thoroughly discussed with the patient.  . S/P excision of acoustic neuroma   . History of prostate cancer 2002    s/p treatment with seeds / radiation  . Lumbar spinal stenosis 07/13/2009  . COPD, mild 12/22/2010    Past Surgical History  Procedure Date  . Coronary artery bypass graft   . Transperineal implatation of palladium   . Cholecystectomy   . Shoulder surgery   . Total knee arthroplasty   . Cataract extraction   . Tonsillectomy and adenoidectomy 1932  . Craniectomy for excision of acoustic neuroma 1985  . Cataract extraction 2010    History  Substance Use Topics  . Smoking status: Former Smoker    Types: Cigarettes    Quit date: 01/22/1961  . Smokeless tobacco: Never Used  . Alcohol  Use: 0.5 oz/week    1 drink(s) per week     Comment: rarely    Family History  Problem Relation Age of Onset  . Emphysema Brother   . Cancer Brother     lung  . Alcohol abuse    . Arthritis    . Cancer    . Macular degeneration    . Heart disease Father   . Colon cancer Sister   . Cancer Sister     non-hodgkins  . Heart disease Brother   . Cancer Sister     colon  . Cancer Sister     skin    No Known Allergies  Medication list has been reviewed and updated.  Outpatient Prescriptions Prior to Visit  Medication Sig Dispense Refill  . albuterol (VENTOLIN HFA) 108 (90 BASE) MCG/ACT inhaler Inhale 2 puffs into the lungs every 6 (six) hours as needed for wheezing.  1 Inhaler  5  . aspirin 325 MG tablet Take 325 mg by mouth daily.      . B Complex-C-Folic Acid (NEPHRO-VITE PO) Take 1 tablet by mouth daily.        . isosorbide mononitrate (IMDUR) 60 MG 24 hr tablet Take 1 tablet (60 mg total) by mouth daily.  90 tablet  3  . Lutein 6 MG CAPS Take by mouth daily.        . metoprolol succinate (TOPROL-XL) 25 MG 24 hr tablet Take 0.5 tablets (12.5 mg total) by mouth daily.  45 tablet  3  . Multiple Vitamins-Minerals (RA VISION-VITE PRESERVE PO) Take 2 capsules by mouth daily.        . nitroGLYCERIN (NITROLINGUAL) 0.4 MG/SPRAY spray Place 1 spray under the tongue every 5 (five) minutes as needed.  12 g  6  . omeprazole (PRILOSEC) 20 MG capsule Take 20 mg by mouth daily.        . pravastatin (PRAVACHOL) 40 MG tablet Take 1 tablet (40 mg total) by mouth daily.  90 tablet  3  . cholecalciferol (VITAMIN D) 400 UNITS TABS Take 400 Units by mouth.       Last reviewed on 12/17/2011  2:47 PM by Consuello Masse, CMA  Review of Systems:   General:  Denies fever, chills, sweats. No significant weight loss. Eyes: Denies blurring,significant itching ENT: Denies earache, sore throat, and hoarseness. NEW HEARING AIDS Cardiovascular: CHEST PAIN AND DYSPNEA WITH WALKING 1 BLOCK OR  SO Respiratory: Denies cough, dyspnea at rest,wheeezing GI: Denies nausea, vomiting, diarrhea, constipation, change in bowel habits, abdominal pain, melena, hematochezia GU: Denies penile discharge, ED, urinary flow / outflow problems. No STD concerns. Musculoskeletal: SIGNIFIGANT BACK PAIN, CHRONIC Derm: Denies rash, itching Neuro: Denies  paresthesias, frequent falls, frequent headaches Psych: Denies depression, anxiety Endocrine: Denies cold intolerance, heat intolerance, polydipsia Heme: Denies enlarged lymph nodes Allergy: No hayfever   Physical Examination: Filed Vitals:   12/17/11 1447  BP: 130/80  Pulse: 57  Temp: 98.1 F (36.7 C)  TempSrc: Oral  Height: 5\' 10"  (1.778 m)  Weight: 201 lb 8 oz (91.4 kg)  SpO2: 98%    Body mass index is 28.91 kg/(m^2). Ideal Body Weight: Weight in (lb) to have BMI = 25: 173.9    Wt Readings from Last 3 Encounters:  12/17/11 201 lb 8 oz (91.4 kg)  11/13/11 197 lb 6.4 oz (89.54 kg)  08/20/11 197 lb 3.2 oz (89.449 kg)    GEN: well developed, well nourished, no acute distress Eyes: conjunctiva and lids normal, PERRLA, EOMI ENT: TM clear, nares clear, oral exam WNL Neck: supple, no lymphadenopathy, no thyromegaly, no JVD Pulm: clear to auscultation and percussion, respiratory effort normal CV: regular rate and rhythm, S1-S2, no murmur, rub or gallop, no bruits, peripheral pulses normal and symmetric, no cyanosis, clubbing, edema or varicosities GI: soft, non-tender; no hepatosplenomegaly, masses; active bowel sounds all quadrants GU: defer Lymph: no cervical, axillary or inguinal adenopathy MSK: gait normal, muscle tone and strength WNL, no joint swelling, effusions, discoloration, crepitus  SKIN: clear, good turgor, color WNL, no rashes, lesions, or ulcerations Neuro: normal mental status, normal strength, sensation, and motion Psych: alert; oriented to person, place and time, normally interactive and not anxious or depressed in  appearance.   Assessment and Plan: 1. Routine general medical examination at a health care facility     I have personally reviewed the Medicare Annual Wellness questionnaire and have noted 1. The patient's medical and social history 2. Their use of alcohol, tobacco or illicit drugs 3. Their current medications and supplements 4. The patient's functional ability including ADL's, fall risks, home safety risks and hearing or visual             impairment. 5. Diet and physical activities 6. Evidence for depression or mood disorders  The patients weight, height, BMI and visual acuity have been recorded in the chart I have made referrals, counseling and provided education to the patient based review of the above and I have provided the pt with a written personalized care plan for preventive services.  I have provided the patient with a copy of your personalized plan for preventive services. Instructed to take the time to review along with their updated medication list.   Hannah Beat, MD

## 2011-12-26 ENCOUNTER — Telehealth (INDEPENDENT_AMBULATORY_CARE_PROVIDER_SITE_OTHER): Payer: Self-pay

## 2011-12-26 NOTE — Telephone Encounter (Signed)
Pt given phone # to set up u/s and will call back for appt with Dr Gerrit Friends in Jan.

## 2012-01-21 ENCOUNTER — Ambulatory Visit
Admission: RE | Admit: 2012-01-21 | Discharge: 2012-01-21 | Disposition: A | Discharge status: Home / Self Care | Payer: Medicare Other | Source: Ambulatory Visit | Attending: Surgery | Admitting: Surgery

## 2012-01-21 DIAGNOSIS — E041 Nontoxic single thyroid nodule: Secondary | ICD-10-CM

## 2012-02-06 ENCOUNTER — Encounter (INDEPENDENT_AMBULATORY_CARE_PROVIDER_SITE_OTHER): Payer: Self-pay | Admitting: Surgery

## 2012-02-06 ENCOUNTER — Ambulatory Visit (INDEPENDENT_AMBULATORY_CARE_PROVIDER_SITE_OTHER): Payer: Medicare Other | Admitting: Surgery

## 2012-02-06 VITALS — BP 126/70 | HR 80 | Temp 97.8°F | Resp 16 | Ht 70.0 in | Wt 194.6 lb

## 2012-02-06 DIAGNOSIS — E041 Nontoxic single thyroid nodule: Secondary | ICD-10-CM

## 2012-02-06 NOTE — Patient Instructions (Signed)
Thyroid Diseases Your thyroid is a butterfly-shaped gland in your neck. It is located just above your collarbone. It is one of your endocrine glands, which make hormones. The thyroid helps set your metabolism. Metabolism is how your body gets energy from the foods you eat.  Millions of people have thyroid diseases. Women experience thyroid problems more often than men. In fact, overactive thyroid problems (hyperthyroidism) occur in 1% of all women. If you have a thyroid disease, your body may use energy more slowly or quickly than it should.  Thyroid problems also include an immune disease where your body reacts against your thyroid gland (called thyroiditis). A different problem involves lumps and bumps (called nodules) that develop in the gland. The nodules are usually, but not always, noncancerous. THE MOST COMMON THYROID PROBLEMS AND CAUSES ARE DISCUSSED BELOW There are many causes for thyroid problems. Treatment depends upon the exact diagnosis and includes trying to reset your body's metabolism to a normal rate. Hyperthyroidism Too much thyroid hormone from an overactive thyroid gland is called hyperthyroidism. In hyperthyroidism, the body's metabolism speeds up. One of the most frequent forms of hyperthyroidism is known as Graves' disease. Graves' disease tends to run in families. Although Graves' is thought to be caused by a problem with the immune system, the exact nature of the genetic problem is unknown. Hypothyroidism Too little thyroid hormone from an underactive thyroid gland is called hypothyroidism. In hypothyroidism, the body's metabolism is slowed. Several things can cause this condition. Most causes affect the thyroid gland directly and hurt its ability to make enough hormone.  Rarely, there may be a pituitary gland tumor (located near the base of the brain). The tumor can block the pituitary from producing thyroid-stimulating hormone (TSH). Your body makes TSH to stimulate the thyroid  to work properly. If the pituitary does not make enough TSH, the thyroid fails to make enough hormones needed for good health. Whether the problem is caused by thyroid conditions or by the pituitary gland, the result is that the thyroid is not making enough hormones. Hypothyroidism causes many physical and mental processes to become sluggish. The body consumes less oxygen and produces less body heat. Thyroid Nodules A thyroid nodule is a small swelling or lump in the thyroid gland. They are common. These nodules represent either a growth of thyroid tissue or a fluid-filled cyst. Both form a lump in the thyroid gland. Almost half of all people will have tiny thyroid nodules at some point in their lives. Typically, these are not noticeable until they become large and affect normal thyroid size. Larger nodules that are greater than a half inch across (about 1 centimeter) occur in about 5 percent of people. Although most nodules are not cancerous, people who have them should seek medical care to rule out cancer. Also, some thyroid nodules may produce too much thyroid hormone or become too large. Large nodules or a large gland can interfere with breathing or swallowing or may cause neck discomfort. Other problems Other thyroid problems include cancer and thyroiditis. Thyroiditis is a malfunction of the body's immune system. Normally, the immune system works to defend the body against infection and other problems. When the immune system is not working properly, it may mistakenly attack normal cells, tissues, and organs. Examples of autoimmune diseases are Hashimoto's thyroiditis (which causes low thyroid function) and Graves' disease (which causes excess thyroid function). SYMPTOMS  Symptoms vary greatly depending upon the exact type of problem with the thyroid. Hyperthyroidism-is when your thyroid is too   active and makes more thyroid hormone than your body needs. The most common cause is Graves' Disease. Too  much thyroid hormone can cause some or all of the following symptoms:  Anxiety.  Irritability.  Difficulty sleeping.  Fatigue.  A rapid or irregular heartbeat.  A fine tremor of your hands or fingers.  An increase in perspiration.  Sensitivity to heat.  Weight loss, despite normal food intake.  Brittle hair.  Enlargement of your thyroid gland (goiter).  Light menstrual periods.  Frequent bowel movements. Graves' disease can specifically cause eye and skin problems. The skin problems involve reddening and swelling of the skin, often on your shins and on the top of your feet. Eye problems can include the following:  Excess tearing and sensation of grit or sand in either or both eyes.  Reddened or inflamed eyes.  Widening of the space between your eyelids.  Swelling of the lids and tissues around the eyes.  Light sensitivity.  Ulcers on the cornea.  Double vision.  Limited eye movements.  Blurred or reduced vision. Hypothyroidism- is when your thyroid gland is not active enough. This is more common than hyperthyroidism. Symptoms can vary a lot depending of the severity of the hormone deficiency. Symptoms may develop over a long period of time and can include several of the following:  Fatigue.  Sluggishness.  Increased sensitivity to cold.  Constipation.  Pale, dry skin.  A puffy face.  Hoarse voice.  High blood cholesterol level.  Unexplained weight gain.  Muscle aches, tenderness and stiffness.  Pain, stiffness or swelling in your joints.  Muscle weakness.  Heavier than normal menstrual periods.  Brittle fingernails and hair.  Depression. Thyroid Nodules - most do not cause signs or symptoms. Occasionally, some may become so large that you can feel or even see the swelling at the base of your neck. You may realize a lump or swelling is there when you are shaving or putting on makeup. Men might become aware of a nodule when shirt collars  suddenly feel too tight. Some nodules produce too much thyroid hormone. This can produce the same symptoms as hyperthyroidism (see above). Thyroid nodules are seldom cancerous. However, a nodule is more likely to be malignant (cancerous) if it:  Grows quickly or feels hard.  Causes you to become hoarse or to have trouble swallowing or breathing.  Causes enlarged lymph nodes under your jaw or in your neck. DIAGNOSIS  Because there are so many possible thyroid conditions, your caregiver may ask for a number of tests. They will do this in order to narrow down the exact diagnosis. These tests can include:  Blood and antibody tests.  Special thyroid scans using small, safe amounts of radioactive iodine.  Ultrasound of the thyroid gland (particularly if there is a nodule or lump).  Biopsy. This is usually done with a special needle. A needle biopsy is a procedure to obtain a sample of cells from the thyroid. The tissue will be tested in a lab and examined under a microscope. TREATMENT  Treatment depends on the exact diagnosis. Hyperthyroidism  Beta-blockers help relieve many of the symptoms.  Anti-thyroid medications prevent the thyroid from making excess hormones.  Radioactive iodine treatment can destroy overactive thyroid cells. The iodine can permanently decrease the amount of hormone produced.  Surgery to remove the thyroid gland.  Treatments for eye problems that come from Graves' disease also include medications and special eye surgery, if felt to be appropriate. Hypothyroidism Thyroid replacement with levothyroxine is   the mainstay of treatment. Treatment with thyroid replacement is usually lifelong and will require monitoring and adjustment from time to time. Thyroid Nodules  Watchful waiting. If a small nodule causes no symptoms or signs of cancer on biopsy, then no treatment may be chosen at first. Re-exam and re-checking blood tests would be the recommended  follow-up.  Anti-thyroid medications or radioactive iodine treatment may be recommended if the nodules produce too much thyroid hormone (see Treatment for Hyperthyroidism above).  Alcohol ablation. Injections of small amounts of ethyl alcohol (ethanol) can cause a non-cancerous nodule to shrink in size.  Surgery (see Treatment for Hyperthyroidism above). HOME CARE INSTRUCTIONS   Take medications as instructed.  Follow through on recommended testing. SEEK MEDICAL CARE IF:   You feel that you are developing symptoms of Hyperthyroidism or Hypothyroidism as described above.  You develop a new lump/nodule in the neck/thyroid area that you had not noticed before.  You feel that you are having side effects from medicines prescribed.  You develop trouble breathing or swallowing. SEEK IMMEDIATE MEDICAL CARE IF:   You develop a fever of 102 F (38.9 C) or higher.  You develop severe sweating.  You develop palpitations and/or rapid heart beat.  You develop shortness of breath.  You develop nausea and vomiting.  You develop extreme shakiness.  You develop agitation.  You develop lightheadedness or have a fainting episode. Document Released: 11/05/2006 Document Revised: 04/02/2011 Document Reviewed: 11/05/2006 [employer] Patient Information 2013 [employer].  

## 2012-02-06 NOTE — Progress Notes (Signed)
General Surgery [facility]  Visit Diagnoses: 1. THYROID NODULE     HISTORY: Patient is an 79 year old white male followed for right dominant thyroid nodule with atypia. Patient has opted in the past to avoid surgical resection. We continue to follow the thyroid nodules with annual ultrasound evaluation and physical exam.  Patient underwent followup thyroid ultrasound at the end of December 2013. This shows a stable thyroid gland with the right lobe measuring 6.0 cm in the left lobe measuring 4.7 cm. The dominant nodule in the inferior right thyroid lobe measures 2.8 cm which is unchanged from previous examinations. There is a partially calcified lesion which has enlarged slightly in the right lobe now measuring 10 mm in size.  No new nodules are identified. No lymphadenopathy is seen.  PERTINENT REVIEW OF SYSTEMS: No change in self-examination. No compressive symptoms. Denies tremors. Denies palpitations.  EXAM: HEENT: normocephalic; pupils equal and reactive; sclerae clear; dentition fair; mucous membranes moist NECK:  Palpable nodule in the lower right thyroid lobe, mobile with swallowing, nontender, approximately 2 cm in dimension; symmetric on extension; no palpable anterior or posterior cervical lymphadenopathy; no supraclavicular masses; no tenderness CHEST: clear to auscultation bilaterally without rales, rhonchi, or wheezes CARDIAC: regular rate and rhythm without significant murmur; peripheral pulses are full EXT:  non-tender without edema; no deformity NEURO: no gross focal deficits; no sign of tremor   IMPRESSION: #1 dominant right thyroid nodule, 2.8 cm, with calcifications and cytologic atypia, clinically stable #2 10 mm peripherally calcified thyroid nodule on right, slight interval enlargement  PLAN: The patient and his wife and I again discussed the significance of the above findings. We reviewed his ultrasound report at length. While there is a risk  of malignancy in these nodules, I continue to believe that the risk of surgical resection is greater than the risk of continued close observation. They are in agreement. We will arrange for follow-up physical examination and follow-up thyroid ultrasound in one year.  Velora Heckler, MD, [facility] Office: [phone]

## 2012-02-08 ENCOUNTER — Encounter: Payer: Self-pay | Admitting: Surgery

## 2012-02-11 ENCOUNTER — Other Ambulatory Visit (INDEPENDENT_AMBULATORY_CARE_PROVIDER_SITE_OTHER): Payer: Medicare Other | Admitting: *Deleted

## 2012-02-11 ENCOUNTER — Encounter: Payer: Self-pay | Admitting: Surgery

## 2012-02-11 ENCOUNTER — Ambulatory Visit (INDEPENDENT_AMBULATORY_CARE_PROVIDER_SITE_OTHER): Payer: Medicare Other | Admitting: Surgery

## 2012-02-11 VITALS — BP 142/61 | HR 57 | Ht 70.0 in | Wt 199.5 lb

## 2012-02-11 DIAGNOSIS — I6529 Occlusion and stenosis of unspecified carotid artery: Secondary | ICD-10-CM

## 2012-02-11 NOTE — Addendum Note (Signed)
Addended by: Sharee Pimple on: 02/11/2012 03:17 PM   Modules accepted: Orders

## 2012-02-11 NOTE — Progress Notes (Signed)
[facility]   Patient name: Andrew [name] MRN: [id number] DOB: [birth year] Sex: male     Chief Complaint  Patient presents with  . Carotid    6 month f/u - pt has no complaints    HISTORY OF PRESENT ILLNESS: The patient is here today for his followup carotid stenosis. Since our last visit, he has remained symptom-free. Other than some right leg numbness which he attributes to his spinal stenosis, he has been without neurologic deficit. This includes no amaurosis fugax, no weakness in either extremity, and no slurred speech. His activity continues to be limited by angina, but he has learned to tolerate this.  Past Medical History  Diagnosis Date  . HYPERCHOLESTEROLEMIA   . CAD, ARTERY BYPASS GRAFT   . CAROTID ARTERY STENOSIS 03/07/2010    80%  . AORTIC STENOSIS   . GASTROESOPHAGEAL REFLUX DISEASE   . Skin cancer   . History of colonic polyps     1999, 2004  . Allergic rhinitis   . Thyroid nodule 05/2010    Abnormal biopsy, 79 year old patient and his wife have made informed decision to not pursue surgical resection. Potential risk including cancer has been thoroughly discussed with the patient.  . S/P excision of acoustic neuroma   . History of prostate cancer 2002    s/p treatment with seeds / radiation  . Lumbar spinal stenosis 07/13/2009  . COPD, mild 12/22/2010    Past Surgical History  Procedure Date  . Coronary artery bypass graft   . Transperineal implatation of palladium   . Cholecystectomy   . Shoulder surgery   . Total knee arthroplasty   . Cataract extraction   . Tonsillectomy and adenoidectomy 1932  . Craniectomy for excision of acoustic neuroma 1985  . Cataract extraction 2010    History   Social History  . Marital Status: Married    Spouse Name: N/A    Number of Children: N/A  . Years of Education: N/A   Occupational History  . 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 Smoking status:  Former Smoker    Types: Cigarettes    Quit date: 01/22/1961  . Smokeless tobacco: Never Used  . Alcohol Use: 0.5 oz/week    1 drink(s) per week     Comment: rarely  . Drug Use: No  . Sexually Active: Not on file   Other Topics Concern  . Not on file   Social History Narrative  . No narrative on file    Family History  Problem Relation Age of Onset  . Emphysema Brother   . Cancer Brother     lung  . Alcohol abuse    . Arthritis    . Cancer    . Macular degeneration    . Heart disease Father   . Colon cancer Sister   . Cancer Sister     non-hodgkins  . Heart disease Brother   . Cancer Sister     colon  . Cancer Sister     skin  . Cancer Daughter     lung    Allergies as of 02/11/2012  . (No Known Allergies)    Current Outpatient Prescriptions on File Prior to Visit  Medication Sig Dispense Refill  . aspirin 325 MG tablet Take 325 mg by mouth daily.      . B Complex-C-Folic Acid (NEPHRO-VITE PO) Take 1 tablet by mouth  daily.        . cholecalciferol (VITAMIN D) 400 UNITS TABS Take 400 Units by mouth.      . isosorbide mononitrate (IMDUR) 60 MG 24 hr tablet Take 1 tablet (60 mg total) by mouth daily.  90 tablet  3  . metoprolol succinate (TOPROL-XL) 25 MG 24 hr tablet Take 0.5 tablets (12.5 mg total) by mouth daily.  45 tablet  3  . Multiple Vitamins-Minerals (RA VISION-VITE PRESERVE PO) Take 2 capsules by mouth daily.        . nitroGLYCERIN (NITROLINGUAL) 0.4 MG/SPRAY spray Place 1 spray under the tongue every 5 (five) minutes as needed.  12 g  6  . omeprazole (PRILOSEC) 20 MG capsule Take 20 mg by mouth daily.        . pravastatin (PRAVACHOL) 40 MG tablet Take 1 tablet (40 mg total) by mouth daily.  90 tablet  3     REVIEW OF SYSTEMS: No changes since prior visit  PHYSICAL EXAMINATION:   Vital signs are BP 142/61  Pulse 57  Ht 5\' 10"  (1.778 m)  Wt 199 lb 8 oz (90.493 kg)  BMI 28.63 kg/m2 General: The patient appears their stated age. HEENT:  No gross  abnormalities Pulmonary:  Non labored breathing Musculoskeletal: There are no major deformities. Neurologic: No focal weakness or paresthesias are detected, Skin: There are no ulcer or rashes noted. Psychiatric: The patient has normal affect. Cardiovascular: There is a regular rate and rhythm without significant murmur appreciated. Pedal pulses are palpable   Diagnostic Studies Carotid duplex was ordered and reviewed today. This shows a 40-59% right sided stenosis and 60-79% left-sided stenosis. Peak end diastolic velocity on the left was 92 cm per sec  Assessment: Asymptomatic carotid stenosis, left greater than right Plan: The patient continues to remain asymptomatic. A previous CT angiogram was done of the neck at a time when he had discrepancies between ultrasound findings. The CT scan showed a stenosis of approximately 80%. Due to the calcification within the patient's aortic arch, he is not a candidate for carotid stenting. Because he has remained asymptomatic, I have been reluctant to proceed with endarterectomy given his underlying comorbidities. By ultrasound today, the patient's disease and stenosis remains stable, less than 80%. As long as the patient remaines asymptomatic, I would be reluctant to offer him carotid endarterectomy unless his stenosis approached the point of near occlusion. The patient and his wife are in agreement with this. I am scheduling him to come back to see me with a repeat duplex in one year.  Jorge Ny, M.D. [facility] [facility] Office: [phone] Pager:  [phone]

## 2012-03-08 ENCOUNTER — Other Ambulatory Visit: Payer: Self-pay

## 2012-03-13 ENCOUNTER — Ambulatory Visit (HOSPITAL_COMMUNITY): Payer: Medicare Other | Attending: Cardiology | Admitting: Radiology

## 2012-03-13 ENCOUNTER — Other Ambulatory Visit: Payer: Self-pay

## 2012-03-13 DIAGNOSIS — E785 Hyperlipidemia, unspecified: Secondary | ICD-10-CM | POA: Insufficient documentation

## 2012-03-13 DIAGNOSIS — I080 Rheumatic disorders of both mitral and aortic valves: Secondary | ICD-10-CM | POA: Insufficient documentation

## 2012-03-13 DIAGNOSIS — E7800 Pure hypercholesterolemia, unspecified: Secondary | ICD-10-CM

## 2012-03-13 DIAGNOSIS — I2510 Atherosclerotic heart disease of native coronary artery without angina pectoris: Secondary | ICD-10-CM | POA: Insufficient documentation

## 2012-03-13 DIAGNOSIS — R0789 Other chest pain: Secondary | ICD-10-CM | POA: Insufficient documentation

## 2012-03-13 DIAGNOSIS — I359 Nonrheumatic aortic valve disorder, unspecified: Secondary | ICD-10-CM

## 2012-03-13 DIAGNOSIS — I6529 Occlusion and stenosis of unspecified carotid artery: Secondary | ICD-10-CM

## 2012-03-13 DIAGNOSIS — J4489 Other specified chronic obstructive pulmonary disease: Secondary | ICD-10-CM | POA: Insufficient documentation

## 2012-03-13 DIAGNOSIS — I25810 Atherosclerosis of coronary artery bypass graft(s) without angina pectoris: Secondary | ICD-10-CM

## 2012-03-13 DIAGNOSIS — I059 Rheumatic mitral valve disease, unspecified: Secondary | ICD-10-CM

## 2012-03-13 DIAGNOSIS — J449 Chronic obstructive pulmonary disease, unspecified: Secondary | ICD-10-CM | POA: Insufficient documentation

## 2012-03-13 NOTE — Progress Notes (Signed)
Echocardiogram performed.  

## 2012-03-17 ENCOUNTER — Ambulatory Visit (INDEPENDENT_AMBULATORY_CARE_PROVIDER_SITE_OTHER): Payer: Medicare Other | Admitting: Cardiology

## 2012-03-17 ENCOUNTER — Encounter: Payer: Self-pay | Admitting: Cardiology

## 2012-03-17 ENCOUNTER — Other Ambulatory Visit (HOSPITAL_COMMUNITY): Payer: Medicare Other

## 2012-03-17 VITALS — BP 134/68 | HR 61 | Ht 70.0 in | Wt 199.0 lb

## 2012-03-17 DIAGNOSIS — I25810 Atherosclerosis of coronary artery bypass graft(s) without angina pectoris: Secondary | ICD-10-CM

## 2012-03-17 DIAGNOSIS — I6529 Occlusion and stenosis of unspecified carotid artery: Secondary | ICD-10-CM

## 2012-03-17 DIAGNOSIS — I359 Nonrheumatic aortic valve disorder, unspecified: Secondary | ICD-10-CM

## 2012-03-17 NOTE — Patient Instructions (Addendum)
Your physician recommends that you schedule a follow-up appointment in: 1 MONTH   Your physician recommends that you continue on your current medications as directed. Please refer to the Current Medication list given to you today.

## 2012-03-17 NOTE — Progress Notes (Signed)
HPI:  The patient is in for follow up.  He seems to be having more angina. This has gotten a little worse with two episodes while doing very little.  Sublingual NTG has helped some.  Both his thyroid and carotid abnormalities have been approached conservatively.    Current Outpatient Prescriptions  Medication Sig Dispense Refill  . aspirin 325 MG tablet Take 325 mg by mouth daily.      . B Complex-C-Folic Acid (NEPHRO-VITE PO) Take 1 tablet by mouth daily.        . cholecalciferol (VITAMIN D) 400 UNITS TABS Take 400 Units by mouth.      . isosorbide mononitrate (IMDUR) 60 MG 24 hr tablet Take 1 tablet (60 mg total) by mouth daily.  90 tablet  3  . metoprolol succinate (TOPROL-XL) 25 MG 24 hr tablet Take 0.5 tablets (12.5 mg total) by mouth daily.  45 tablet  3  . Multiple Vitamins-Minerals (RA VISION-VITE PRESERVE PO) Take 2 capsules by mouth daily.        . nitroGLYCERIN (NITROLINGUAL) 0.4 MG/SPRAY spray Place 1 spray under the tongue every 5 (five) minutes as needed.  12 g  6  . omeprazole (PRILOSEC) 20 MG capsule Take 20 mg by mouth daily.        . pravastatin (PRAVACHOL) 40 MG tablet Take 1 tablet (40 mg total) by mouth daily.  90 tablet  3   No current facility-administered medications for this visit.    No Known Allergies  Past Medical History  Diagnosis Date  . HYPERCHOLESTEROLEMIA   . CAD, ARTERY BYPASS GRAFT   . CAROTID ARTERY STENOSIS 03/07/2010    80%  . AORTIC STENOSIS   . GASTROESOPHAGEAL REFLUX DISEASE   . Skin cancer   . History of colonic polyps     1999, 2004  . Allergic rhinitis   . Thyroid nodule 05/2010    Abnormal biopsy, 79 year old patient and his wife have made informed decision to not pursue surgical resection. Potential risk including cancer has been thoroughly discussed with the patient.  . S/P excision of acoustic neuroma   . History of prostate cancer 2002    s/p treatment with seeds / radiation  . Lumbar spinal stenosis 07/13/2009  . COPD, mild  12/22/2010    Past Surgical History  Procedure Laterality Date  . Coronary artery bypass graft    . Transperineal implatation of palladium    . Cholecystectomy    . Shoulder surgery    . Total knee arthroplasty    . Cataract extraction    . Tonsillectomy and adenoidectomy  1932  . Craniectomy for excision of acoustic neuroma  1985  . Cataract extraction  2010    Family History  Problem Relation Age of Onset  . Emphysema Brother   . Cancer Brother     lung  . Alcohol abuse    . Arthritis    . Cancer    . Macular degeneration    . Heart disease Father   . Colon cancer Sister   . Cancer Sister     non-hodgkins  . Heart disease Brother   . Cancer Sister     colon  . Cancer Sister     skin  . Cancer Daughter     lung    History   Social History  . Marital Status: Married    Spouse Name: N/A    Number of Children: N/A  . Years of Education: N/A  Occupational History  . 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 Smoking status: Former Smoker    Types: Cigarettes    Quit date: 01/22/1961  . Smokeless tobacco: Never Used  . Alcohol Use: 0.5 oz/week    1 drink(s) per week     Comment: rarely  . Drug Use: No  . Sexually Active: Not on file   Other Topics Concern  . Not on file   Social History Narrative  . No narrative on file    ROS: Please see the HPI.  All other systems reviewed and negative.  PHYSICAL EXAM:  BP 134/68  Pulse 61  Ht 5\' 10"  (1.778 m)  Wt 199 lb (90.266 kg)  BMI 28.55 kg/m2  SpO2 96%  General: thin older gentleman in no distress Head:  Normocephalic and atraumatic. Neck: no JVD.  Left carotid bruit.   Lungs: Clear to auscultation and percussion. Heart: Normal S1 and S2.  2-3/6 SEM.  No DM.   Pulses: Pulses normal in all 4 extremities. Extremities: No clubbing or cyanosis. No edema. Neurologic: Alert and oriented x 3.  EKG:  NSR.  Moderate voltage criteria for LVH.  Nonspecific ST and T  abnormality.    ASSESSMENT AND PLAN:

## 2012-03-18 ENCOUNTER — Encounter (INDEPENDENT_AMBULATORY_CARE_PROVIDER_SITE_OTHER): Payer: Medicare Other | Admitting: General Surgery

## 2012-03-18 NOTE — Assessment & Plan Note (Signed)
Being followed closely at [facility].  Conservative management has been suggested.

## 2012-03-18 NOTE — Assessment & Plan Note (Signed)
Angina has gotten slightly worse.  Will see back in one month.  Worsening AS may be having a role.  However, he is not a good candidate for recath.

## 2012-03-18 NOTE — Assessment & Plan Note (Signed)
Gradient is up slightly.  Will continue to monitor and treat medically.

## 2012-04-23 ENCOUNTER — Ambulatory Visit (INDEPENDENT_AMBULATORY_CARE_PROVIDER_SITE_OTHER): Payer: Medicare Other | Admitting: Cardiology

## 2012-04-23 ENCOUNTER — Encounter: Payer: Self-pay | Admitting: Cardiology

## 2012-04-23 VITALS — BP 118/60 | HR 61 | Ht 70.0 in | Wt 194.0 lb

## 2012-04-23 DIAGNOSIS — R911 Solitary pulmonary nodule: Secondary | ICD-10-CM

## 2012-04-23 DIAGNOSIS — I25810 Atherosclerosis of coronary artery bypass graft(s) without angina pectoris: Secondary | ICD-10-CM

## 2012-04-23 DIAGNOSIS — I6529 Occlusion and stenosis of unspecified carotid artery: Secondary | ICD-10-CM

## 2012-04-23 DIAGNOSIS — I359 Nonrheumatic aortic valve disorder, unspecified: Secondary | ICD-10-CM

## 2012-04-23 MED ORDER — ISOSORBIDE MONONITRATE ER 60 MG PO TB24: 60.0000 mg | ORAL_TABLET | Freq: Every day | ORAL | Status: DC

## 2012-04-23 MED ORDER — PRAVASTATIN SODIUM 40 MG PO TABS: 40.0000 mg | ORAL_TABLET | Freq: Every day | ORAL | Status: DC

## 2012-04-23 MED ORDER — METOPROLOL SUCCINATE ER 25 MG PO TB24: 25.0000 mg | ORAL_TABLET | Freq: Every day | ORAL | Status: DC

## 2012-04-23 NOTE — Progress Notes (Signed)
HPI:  This nice pati this has not progressed dramatically over the past several months ent is in for followup. He brought up the point that he tends to get sleepy in the afternoon and sleep more than he normally would in the past. He's continued to be very sharp. He does experience moderate angina with exertion, but has learned to pace himself in this environment.  He's not had chest pain at rest.   Current Outpatient Prescriptions  Medication Sig Dispense Refill  . aspirin 325 MG tablet Take 325 mg by mouth daily.      . B Complex-C-Folic Acid (NEPHRO-VITE PO) Take 1 tablet by mouth daily.        . cholecalciferol (VITAMIN D) 400 UNITS TABS Take 400 Units by mouth.      . isosorbide mononitrate (IMDUR) 60 MG 24 hr tablet Take 1 tablet (60 mg total) by mouth daily.  90 tablet  3  . metoprolol succinate (TOPROL-XL) 25 MG 24 hr tablet Take 1 tablet (25 mg total) by mouth daily.  90 tablet  3  . Multiple Vitamins-Minerals (RA VISION-VITE PRESERVE PO) Take 2 capsules by mouth daily.        . nitroGLYCERIN (NITROLINGUAL) 0.4 MG/SPRAY spray Place 1 spray under the tongue every 5 (five) minutes as needed.  12 g  6  . omeprazole (PRILOSEC) 20 MG capsule Take 20 mg by mouth daily.        . pravastatin (PRAVACHOL) 40 MG tablet Take 1 tablet (40 mg total) by mouth daily.  90 tablet  3   No current facility-administered medications for this visit.    No Known Allergies  Past Medical History  Diagnosis Date  . HYPERCHOLESTEROLEMIA   . CAD, ARTERY BYPASS GRAFT   . CAROTID ARTERY STENOSIS 03/07/2010    80%  . AORTIC STENOSIS   . GASTROESOPHAGEAL REFLUX DISEASE   . Skin cancer   . History of colonic polyps     1999, 2004  . Allergic rhinitis   . Thyroid nodule 05/2010    Abnormal biopsy, 79 year old patient and his wife have made informed decision to not pursue surgical resection. Potential risk including cancer has been thoroughly discussed with the patient.  . S/P excision of acoustic neuroma     . History of prostate cancer 2002    s/p treatment with seeds / radiation  . Lumbar spinal stenosis 07/13/2009  . COPD, mild 12/22/2010    Past Surgical History  Procedure Laterality Date  . Coronary artery bypass graft    . Transperineal implatation of palladium    . Cholecystectomy    . Shoulder surgery    . Total knee arthroplasty    . Cataract extraction    . Tonsillectomy and adenoidectomy  1932  . Craniectomy for excision of acoustic neuroma  1985  . Cataract extraction  2010    Family History  Problem Relation Age of Onset  . Emphysema Brother   . Cancer Brother     lung  . Alcohol abuse    . Arthritis    . Cancer    . Macular degeneration    . Heart disease Father   . Colon cancer Sister   . Cancer Sister     non-hodgkins  . Heart disease Brother   . Cancer Sister     colon  . Cancer Sister     skin  . Cancer Daughter     lung    History   Social  History  . Marital Status: Married    Spouse Name: N/A    Number of Children: N/A  . Years of Education: N/A   Occupational History  . 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 Smoking status: Former Smoker    Types: Cigarettes    Quit date: 01/22/1961  . Smokeless tobacco: Never Used  . Alcohol Use: 0.5 oz/week    1 drink(s) per week     Comment: rarely  . Drug Use: No  . Sexually Active: Not on file   Other Topics Concern  . Not on file   Social History Narrative  . No narrative on file    ROS: Please see the HPI.  All other systems reviewed and negative.  PHYSICAL EXAM:  BP 118/60  Pulse 61  Ht 5\' 10"  (1.778 m)  Wt 194 lb (87.998 kg)  BMI 27.84 kg/m2  SpO2 97%  General: Well developed, well nourished, in no acute distress. Head:  Normocephalic and atraumatic. Neck: no JVD Lungs: Clear to auscultation and percussion. Heart: Normal S1 and S2.  2-3/6 SEM at mid LSE.  No diastolic murmur.   Pulses: Slight decrease in pulses in the LE.    Extremities: No clubbing or cyanosis. No edema. Neurologic: Alert and oriented x 3.  EKG:  NSR.  Voltage for LVH with T inversion in V6.  No change from prior tracing.   ECHO   Study Conclusions  - HPI and indications: Exertional chest pain. - Left ventricle: The cavity size was normal. Wall thickness was increased in a pattern of mild LVH. Systolic function was normal. The estimated ejection fraction was in the range of 50% to 55%. Wall motion was normal; there were no regional wall motion abnormalities. Doppler parameters are consistent with abnormal left ventricular relaxation (grade 1 diastolic dysfunction). - Aortic valve: Valve mobility was restricted. There was moderate stenosis. Mild regurgitation. Valve area: 1.29cm^2(VTI). Valve area: 1.18cm^2 (Vmax). - Mitral valve: Calcified annulus. Mildly thickened leaflets . - Left atrium: The atrium was mildly dilated.     ASSESSMENT AND PLAN:

## 2012-04-23 NOTE — Patient Instructions (Addendum)
Your physician recommends that you continue on your current medications as directed. Please refer to the Current Medication list given to you today.  Your physician wants you to follow-up in: 3 to 4 months. You will receive a reminder letter in the mail two months in advance. If you don't receive a letter, please call our office to schedule the follow-up appointment.

## 2012-04-25 ENCOUNTER — Ambulatory Visit: Payer: Medicare Other | Admitting: Cardiology

## 2012-04-25 NOTE — Assessment & Plan Note (Signed)
Followed by Dr. Myra Gianotti  Conservative treatment has been recommended.

## 2012-04-25 NOTE — Assessment & Plan Note (Signed)
See noted of Dr. Patsy Lager, and Dr. Myra Gianotti.

## 2012-04-25 NOTE — Assessment & Plan Note (Signed)
Continues to have moderate angina with fairly limited activity, but stable without progression.  Continued conservative management warranted given age, co-morbidities, and prior history of atheroemboli.

## 2012-04-25 NOTE — Assessment & Plan Note (Signed)
Has moderate aortic stenosis which could eventually complicated his angina.  Will continue conservative management.

## 2012-06-17 ENCOUNTER — Encounter: Payer: Self-pay | Admitting: Cardiology

## 2012-07-14 ENCOUNTER — Other Ambulatory Visit: Payer: Self-pay | Admitting: Dermatology

## 2012-08-04 ENCOUNTER — Encounter: Payer: Self-pay | Admitting: Family Medicine

## 2012-08-04 ENCOUNTER — Ambulatory Visit (INDEPENDENT_AMBULATORY_CARE_PROVIDER_SITE_OTHER): Payer: Medicare Other | Admitting: Family Medicine

## 2012-08-04 VITALS — BP 130/82 | HR 71 | Temp 98.7°F | Ht 70.0 in | Wt 192.5 lb

## 2012-08-04 DIAGNOSIS — J209 Acute bronchitis, unspecified: Secondary | ICD-10-CM

## 2012-08-04 DIAGNOSIS — R0600 Dyspnea, unspecified: Secondary | ICD-10-CM

## 2012-08-04 DIAGNOSIS — I25810 Atherosclerosis of coronary artery bypass graft(s) without angina pectoris: Secondary | ICD-10-CM

## 2012-08-04 DIAGNOSIS — J449 Chronic obstructive pulmonary disease, unspecified: Secondary | ICD-10-CM

## 2012-08-04 DIAGNOSIS — J441 Chronic obstructive pulmonary disease with (acute) exacerbation: Secondary | ICD-10-CM

## 2012-08-04 DIAGNOSIS — R0989 Other specified symptoms and signs involving the circulatory and respiratory systems: Secondary | ICD-10-CM

## 2012-08-04 LAB — BRAIN NATRIURETIC PEPTIDE: Pro B Natriuretic peptide (BNP): 590 pg/mL — ABNORMAL HIGH (ref 0.0–100.0)

## 2012-08-04 MED ORDER — BENZONATATE 100 MG PO CAPS: 100.0000 mg | ORAL_CAPSULE | Freq: Three times a day (TID) | ORAL | Status: DC | PRN

## 2012-08-04 MED ORDER — ALBUTEROL SULFATE HFA 108 (90 BASE) MCG/ACT IN AERS: 2.0000 | INHALATION_SPRAY | Freq: Four times a day (QID) | RESPIRATORY_TRACT | Status: DC | PRN

## 2012-08-04 MED ORDER — DOXYCYCLINE HYCLATE 100 MG PO TABS: 100.0000 mg | ORAL_TABLET | Freq: Two times a day (BID) | ORAL | Status: DC

## 2012-08-04 MED ORDER — HYDROCODONE-HOMATROPINE 5-1.5 MG/5ML PO SYRP: ORAL_SOLUTION | ORAL | Status: DC

## 2012-08-04 NOTE — Progress Notes (Signed)
Nature conservation officer at [facility] [address] [place] [address] Phone: [phone] Fax: [phone]  Date:  08/04/2012   Name:  Andrew [name]   DOB:  1923-04-05   MRN:  [id number] Gender: male Age: 79 y.o.  Primary Physician:  Hannah Beat, MD  Evaluating MD: Hannah Beat, MD   Chief Complaint: URI   History of Present Illness:  Andrew [name] is a 79 y.o. pleasant patient who presents with the following:  Cough has been ongoing for a lot of the time, and on Friday, got a more and more frequent cough. Feels like has moved into his chest and on Saturday night. Could not really get comfortable in bed. Wheezing a lot. Also had some sore throat.  He has had some orthopnea and feels better when he is propped up. He is currently sleeping on 3 pillows. He is currently afebrile. He is coughing, but producing minimal sputum. Has a mild sore throat. No significant rhinorrhea. No earache. No nausea, vomiting, or diarrhea.  No lower extremity swelling.   Patient Active Problem List   Diagnosis Date Noted  . COPD, mild 12/22/2010  . S/P excision of acoustic neuroma 06/18/2010  . Pulmonary nodule 06/17/2010  . Vertigo 06/14/2010  . THYROID NODULE 03/13/2010  . CAROTID ARTERY STENOSIS 03/07/2010  . Lumbar spinal stenosis 07/13/2009  . ALLERGIC RHINITIS 09/01/2008  . COLONIC POLYPS, HX OF 09/01/2008  . HYPERCHOLESTEROLEMIA 06/18/2008  . ANGINA PECTORIS, CLASS II 06/18/2008  . CAD, ARTERY BYPASS GRAFT 06/18/2008  . AORTIC STENOSIS 06/18/2008  . GASTROESOPHAGEAL REFLUX DISEASE 06/18/2008  . RENAL DISEASE, CHRONIC, MILD 06/18/2008    Past Medical History  Diagnosis Date  . HYPERCHOLESTEROLEMIA   . CAD, ARTERY BYPASS GRAFT   . CAROTID ARTERY STENOSIS 03/07/2010    80%  . AORTIC STENOSIS   . GASTROESOPHAGEAL REFLUX DISEASE   . Skin cancer   . History of colonic polyps     1999, 2004  . Allergic rhinitis   . Thyroid nodule 05/2010    Abnormal biopsy, 79 year old  patient and his wife have made informed decision to not pursue surgical resection. Potential risk including cancer has been thoroughly discussed with the patient.  . S/P excision of acoustic neuroma   . History of prostate cancer 2002    s/p treatment with seeds / radiation  . Lumbar spinal stenosis 07/13/2009  . COPD, mild 12/22/2010    Past Surgical History  Procedure Laterality Date  . Coronary artery bypass graft    . Transperineal implatation of palladium    . Cholecystectomy    . Shoulder surgery    . Total knee arthroplasty    . Cataract extraction    . Tonsillectomy and adenoidectomy  1932  . Craniectomy for excision of acoustic neuroma  1985  . Cataract extraction  2010    History   Social History  . Marital Status: Married    Spouse Name: N/A    Number of Children: N/A  . Years of Education: N/A   Occupational History  . 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 Smoking status: Former Smoker    Types: Cigarettes    Quit date: 01/22/1961  . Smokeless tobacco: Never Used  . Alcohol Use: 0.5 oz/week    1 drink(s) per week     Comment: rarely  . Drug Use: No  . Sexually Active: Not on file  Other Topics Concern  . Not on file   Social History Narrative  . No narrative on file    Family History  Problem Relation Age of Onset  . Emphysema Brother   . Cancer Brother     lung  . Alcohol abuse    . Arthritis    . Cancer    . Macular degeneration    . Heart disease Father   . Colon cancer Sister   . Cancer Sister     non-hodgkins  . Heart disease Brother   . Cancer Sister     colon  . Cancer Sister     skin  . Cancer Daughter     lung    No Known Allergies  Medication list has been reviewed and updated.  Outpatient Prescriptions Prior to Visit  Medication Sig Dispense Refill  . aspirin 325 MG tablet Take 325 mg by mouth daily.      . B Complex-C-Folic Acid (NEPHRO-VITE PO) Take 1 tablet by mouth daily.         . cholecalciferol (VITAMIN D) 400 UNITS TABS Take 400 Units by mouth.      . isosorbide mononitrate (IMDUR) 60 MG 24 hr tablet Take 1 tablet (60 mg total) by mouth daily.  90 tablet  3  . metoprolol succinate (TOPROL-XL) 25 MG 24 hr tablet Take 1 tablet (25 mg total) by mouth daily.  90 tablet  3  . Multiple Vitamins-Minerals (RA VISION-VITE PRESERVE PO) Take 2 capsules by mouth daily.        . nitroGLYCERIN (NITROLINGUAL) 0.4 MG/SPRAY spray Place 1 spray under the tongue every 5 (five) minutes as needed.  12 g  6  . omeprazole (PRILOSEC) 20 MG capsule Take 20 mg by mouth daily.        . pravastatin (PRAVACHOL) 40 MG tablet Take 1 tablet (40 mg total) by mouth daily.  90 tablet  3   No facility-administered medications prior to visit.    Review of Systems:  ROS: GEN: Acute illness details above GI: Tolerating PO intake GU: maintaining adequate hydration and urination Pulm: Short of breath with exertion. Chest pain with exertion, notes he got this when walking down to the mailbox, which is not uncommon for him. Interactive and getting along well at home.  Otherwise, ROS is as per the HPI.   Physical Examination: BP 130/82  Pulse 71  Temp(Src) 98.7 F (37.1 C) (Oral)  Ht 5\' 10"  (1.778 m)  Wt 192 lb 8 oz (87.317 kg)  BMI 27.62 kg/m2  SpO2 96%  Ideal Body Weight: Weight in (lb) to have BMI = 25: 173.9   GEN: A and O x 3. WDWN. NAD.    ENT: Nose clear, ext NML.  No LAD.  No JVD.  TM's clear. Oropharynx clear.  PULM: Normal WOB, no distress. Decreased BS, faint crackles CV: RRR, no M/G/R, No rubs, No JVD.   EXT: warm and well-perfused, No c/c/e. PSYCH: Pleasant and conversant.   Assessment and Plan:  Acute bronchitis  Dyspnea - Plan: Brain natriuretic peptide  COPD exacerbation  CAD, ARTERY BYPASS GRAFT  COPD, mild  Bronchitis with mild COPD exacerbation. Pulse ox is 96% on room air.  With some dyspnea, cannot exclude potential heart failure. Obtain a cardiac  BNP.  Orders Today:  Orders Placed This Encounter  Procedures  . Brain natriuretic peptide    Updated Medication List: (Includes new medications, updates to list, dose adjustments) Meds ordered this encounter  Medications  .  doxycycline (VIBRA-TABS) 100 MG tablet    Sig: Take 1 tablet (100 mg total) by mouth 2 (two) times daily.    Dispense:  20 tablet    Refill:  0  . albuterol (PROVENTIL HFA;VENTOLIN HFA) 108 (90 BASE) MCG/ACT inhaler    Sig: Inhale 2 puffs into the lungs every 6 (six) hours as needed for wheezing.    Dispense:  1 Inhaler    Refill:  3  . benzonatate (TESSALON) 100 MG capsule    Sig: Take 1 capsule (100 mg total) by mouth 3 (three) times daily as needed for cough.    Dispense:  40 capsule    Refill:  0  . HYDROcodone-homatropine (HYCODAN) 5-1.5 MG/5ML syrup    Sig: 1 tsp po at night before bed prn cough    Dispense:  120 mL    Refill:  0    Medications Discontinued: There are no discontinued medications.    Signed, Elpidio Galea. Jeannia Tatro, MD 08/04/2012 9:48 AM

## 2012-08-18 ENCOUNTER — Encounter: Payer: Self-pay | Admitting: Cardiology

## 2012-08-18 ENCOUNTER — Ambulatory Visit (INDEPENDENT_AMBULATORY_CARE_PROVIDER_SITE_OTHER): Payer: Medicare Other | Admitting: Cardiology

## 2012-08-18 ENCOUNTER — Ambulatory Visit (INDEPENDENT_AMBULATORY_CARE_PROVIDER_SITE_OTHER)
Admission: RE | Admit: 2012-08-18 | Discharge: 2012-08-18 | Disposition: A | Discharge status: Home / Self Care | Payer: Medicare Other | Source: Ambulatory Visit | Attending: Cardiology | Admitting: Cardiology

## 2012-08-18 VITALS — BP 104/58 | HR 65 | Ht 70.0 in | Wt 195.8 lb

## 2012-08-18 DIAGNOSIS — I509 Heart failure, unspecified: Secondary | ICD-10-CM

## 2012-08-18 DIAGNOSIS — I5032 Chronic diastolic (congestive) heart failure: Secondary | ICD-10-CM

## 2012-08-18 DIAGNOSIS — I25810 Atherosclerosis of coronary artery bypass graft(s) without angina pectoris: Secondary | ICD-10-CM

## 2012-08-18 DIAGNOSIS — I359 Nonrheumatic aortic valve disorder, unspecified: Secondary | ICD-10-CM

## 2012-08-18 DIAGNOSIS — I6529 Occlusion and stenosis of unspecified carotid artery: Secondary | ICD-10-CM

## 2012-08-18 DIAGNOSIS — E7800 Pure hypercholesterolemia, unspecified: Secondary | ICD-10-CM

## 2012-08-18 LAB — BASIC METABOLIC PANEL
CO2: 26 mEq/L (ref 19–32)
Chloride: 105 mEq/L (ref 96–112)
Potassium: 4.5 mEq/L (ref 3.5–5.1)

## 2012-08-18 MED ORDER — FUROSEMIDE 40 MG PO TABS: 40.0000 mg | ORAL_TABLET | Freq: Every day | ORAL | Status: DC

## 2012-08-18 MED ORDER — POTASSIUM CHLORIDE CRYS ER 20 MEQ PO TBCR: 20.0000 meq | EXTENDED_RELEASE_TABLET | Freq: Every day | ORAL | Status: DC

## 2012-08-18 NOTE — Patient Instructions (Addendum)
Start lasix(furosemide) 40mg  daily.   Start KCL (potassium) 20 mEq daily.   Your physician recommends that you have  lab work today--BMET.   Your physician recommends that you return for fasting lab work in: 10 days--BMET/BNP/Lipid profile.  A chest x-ray takes a picture of the organs and structures inside the chest, including the heart, lungs, and blood vessels. This test can show several things, including, whether the heart is enlarges; whether fluid is building up in the lungs; and whether pacemaker / defibrillator leads are still in place. Please go to the basement of [facility] [address] today.  Your physician has requested that you have an echocardiogram. Echocardiography is a painless test that uses sound waves to create images of your heart. It provides your doctor with information about the size and shape of your heart and how well your heart's chambers and valves are working. This procedure takes approximately one hour. There are no restrictions for this procedure.  Your physician recommends that you schedule a follow-up appointment on Monday August 18,2014 at 3:30 PM.

## 2012-08-19 DIAGNOSIS — I495 Sick sinus syndrome: Secondary | ICD-10-CM | POA: Insufficient documentation

## 2012-08-19 NOTE — Progress Notes (Signed)
Patient ID: Andrew [name], male   DOB: 01/04/1924, 79 y.o.   MRN: [id number] PCP: Dr Patsy Lager  79 yo with history of CAD s/p CABG and moderate aortic stenosis presents for evaluation of dyspnea.  He has been seen by Dr. Riley Kill in the past and is seen by me for the first time today.  I reviewed all his old records.  He has a history of chronic stable angina for years.  This pattern has not changed.  He gets chest tightness after walking fast for about 100 yards.    Earlier this month, her reports exposure to mold with the development of dyspnea, wheezing, and coughing.  He was treated for acute bronchitis with a course of doxycycline.  He remains more short of breath than his baseline: DOE after walking about 150 feet.  He has orthopnea and sleeps on 4 pillows. Some bendopnea. BNP was elevated when recently checked.   No palpitations, syncope, stroke-like symptoms.   Labs (11/13): K 4.4, creatinine 1.3, LDL 66, HDL 53 Labs (7/14): BNP 590  PMH: 1. CAD: s/p CABG in 1991 with LIMA-LAD, SVG-PDA, seq SVG to OM1,2,3.  LHC 2004 complicated by cholesterol atheroemboli and AKI.  One limb of seq SVG-OM1,2,3 known to be occluded.  Chronic stable angina.   2. Carotid stenosis: 7/13 carotid dopplers 40-59% on right, 60-79% on left.   3. GERD 4. Polyps 5. Thyroid nodule 6. S/p excision of acoustic neuroma 7. Prostate cancer treated with seed implantation in 2002 8. COPD: mild 9. Cholecystectomy 10. CKD 11. Aortic stenosis: Moderate.  Echo (2/14) with EF 50-55%, mild LVH, moderate AS with AVA 1.3 cm^2 and mean gradient 23 mmHg.   SH: Retired Art gallery manager, quit smoking 1963, married, lives in [place] [facility].  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   Current Outpatient Prescriptions  Medication Sig Dispense Refill  . albuterol (PROVENTIL HFA;VENTOLIN HFA) 108 (90 BASE) MCG/ACT inhaler Inhale 2 puffs into the lungs every 6 (six) hours as needed for wheezing.  1 Inhaler  3  .  aspirin 325 MG tablet Take 325 mg by mouth daily.      . B Complex-C-Folic Acid (NEPHRO-VITE PO) Take 1 tablet by mouth daily.        . benzonatate (TESSALON) 100 MG capsule Take 1 capsule (100 mg total) by mouth 3 (three) times daily as needed for cough.  40 capsule  0  . cholecalciferol (VITAMIN D) 400 UNITS TABS Take 400 Units by mouth.      . doxycycline (VIBRA-TABS) 100 MG tablet Take 1 tablet (100 mg total) by mouth 2 (two) times daily.  20 tablet  0  . HYDROcodone-homatropine (HYCODAN) 5-1.5 MG/5ML syrup 1 tsp po at night before bed prn cough  120 mL  0  . isosorbide mononitrate (IMDUR) 60 MG 24 hr tablet Take 1 tablet (60 mg total) by mouth daily.  90 tablet  3  . metoprolol succinate (TOPROL-XL) 25 MG 24 hr tablet Take 1 tablet (25 mg total) by mouth daily.  90 tablet  3  . Multiple Vitamins-Minerals (RA VISION-VITE PRESERVE PO) Take 2 capsules by mouth daily.        . nitroGLYCERIN (NITROLINGUAL) 0.4 MG/SPRAY spray Place 1 spray under the tongue every 5 (five) minutes as needed.  12 g  6  . omeprazole (PRILOSEC) 20 MG capsule Take 20 mg by mouth daily.        . pravastatin (PRAVACHOL) 40 MG tablet Take 1  tablet (40 mg total) by mouth daily.  90 tablet  3  . furosemide (LASIX) 40 MG tablet Take 1 tablet (40 mg total) by mouth daily.  30 tablet  3  . potassium chloride SA (K-DUR,KLOR-CON) 20 MEQ tablet Take 1 tablet (20 mEq total) by mouth daily.  30 tablet  3   No current facility-administered medications for this visit.    BP 104/58  Pulse 65  Ht 5\' 10"  (1.778 m)  Wt 88.814 kg (195 lb 12.8 oz)  BMI 28.09 kg/m2  SpO2 97% General: NAD Neck: JVP 8-9 cm, no thyromegaly or thyroid nodule.  Lungs: Slight crackles at the bases bilaterally.  CV: Nondisplaced PMI.  Heart regular S1/S2, no S3/S4, 2/6 SEM RUSB with clear S2.  1+ edema 1/2 to knees bilaterally.  No carotid bruit.  Normal pedal pulses.  Abdomen: Soft, nontender, no hepatosplenomegaly, no distention.  Skin: Intact without  lesions or rashes.  Neurologic: Alert and oriented x 3.  Psych: Normal affect. Extremities: No clubbing or cyanosis.   Assessment/Plan: 1. CAD: s/p CABG with long history of chronic stable angina.  Chest pain pattern has not changed any recently.  Continue ASA, Imdur, Toprol XL, statin.  2. Chronic diastolic CHF: Patient has NYHA class II-III symptoms that are new over the last few weeks.  He appears mildly volume overloaded on exam with elevated JVP.   - CXR  - Echo to reassess LV systolic function and aortic stenosis in light of new CHF symptoms.  If EF has fallen from prior, would consider ischemic evaluation.  - Start Lasix 40 mg daily with KCl 20 mEq daily.  BMET/BNP today and in 10 days.  - Followup in 2-3 weeks.  3. Hyperlipidemia: Check lipids with labs in 10 days.  Continue statin.  4. CKD: Follow creatinine carefully with diuresis.  5. Aortic stenosis: Moderate.  Will reassess on echo.  6. Carotid stenosis: Followed by [facility].   [name] 08/19/2012

## 2012-08-26 ENCOUNTER — Ambulatory Visit: Payer: Medicare Other | Admitting: Cardiology

## 2012-08-28 ENCOUNTER — Other Ambulatory Visit (INDEPENDENT_AMBULATORY_CARE_PROVIDER_SITE_OTHER): Payer: Medicare Other

## 2012-08-28 ENCOUNTER — Ambulatory Visit (HOSPITAL_COMMUNITY): Payer: Medicare Other | Attending: Cardiology

## 2012-08-28 DIAGNOSIS — E785 Hyperlipidemia, unspecified: Secondary | ICD-10-CM | POA: Insufficient documentation

## 2012-08-28 DIAGNOSIS — I359 Nonrheumatic aortic valve disorder, unspecified: Secondary | ICD-10-CM

## 2012-08-28 DIAGNOSIS — I5032 Chronic diastolic (congestive) heart failure: Secondary | ICD-10-CM

## 2012-08-28 DIAGNOSIS — R0989 Other specified symptoms and signs involving the circulatory and respiratory systems: Secondary | ICD-10-CM

## 2012-08-28 DIAGNOSIS — Z87891 Personal history of nicotine dependence: Secondary | ICD-10-CM | POA: Insufficient documentation

## 2012-08-28 DIAGNOSIS — J4489 Other specified chronic obstructive pulmonary disease: Secondary | ICD-10-CM | POA: Insufficient documentation

## 2012-08-28 DIAGNOSIS — I6529 Occlusion and stenosis of unspecified carotid artery: Secondary | ICD-10-CM | POA: Insufficient documentation

## 2012-08-28 DIAGNOSIS — I2510 Atherosclerotic heart disease of native coronary artery without angina pectoris: Secondary | ICD-10-CM | POA: Insufficient documentation

## 2012-08-28 DIAGNOSIS — C61 Malignant neoplasm of prostate: Secondary | ICD-10-CM | POA: Insufficient documentation

## 2012-08-28 DIAGNOSIS — I25810 Atherosclerosis of coronary artery bypass graft(s) without angina pectoris: Secondary | ICD-10-CM

## 2012-08-28 DIAGNOSIS — R0602 Shortness of breath: Secondary | ICD-10-CM

## 2012-08-28 DIAGNOSIS — J449 Chronic obstructive pulmonary disease, unspecified: Secondary | ICD-10-CM | POA: Insufficient documentation

## 2012-08-28 DIAGNOSIS — I509 Heart failure, unspecified: Secondary | ICD-10-CM | POA: Insufficient documentation

## 2012-08-28 DIAGNOSIS — N189 Chronic kidney disease, unspecified: Secondary | ICD-10-CM | POA: Insufficient documentation

## 2012-08-28 LAB — BRAIN NATRIURETIC PEPTIDE: Pro B Natriuretic peptide (BNP): 218 pg/mL — ABNORMAL HIGH (ref 0.0–100.0)

## 2012-08-28 LAB — BASIC METABOLIC PANEL
BUN: 19 mg/dL (ref 6–23)
Calcium: 9.1 mg/dL (ref 8.4–10.5)
Creatinine, Ser: 1.4 mg/dL (ref 0.4–1.5)
GFR: 53.08 mL/min — ABNORMAL LOW (ref >60.00)

## 2012-08-28 LAB — LIPID PANEL: VLDL: 21 mg/dL (ref 0.0–40.0)

## 2012-08-28 NOTE — Progress Notes (Signed)
Echocardiogram performed.  

## 2012-09-01 ENCOUNTER — Telehealth: Payer: Self-pay | Admitting: Cardiology

## 2012-09-01 NOTE — Telephone Encounter (Signed)
F/up   Pt returned call about results// requests a call back

## 2012-09-01 NOTE — Telephone Encounter (Signed)
Attempted to call patient back x 2 - Andrew [name]

## 2012-09-02 NOTE — Telephone Encounter (Signed)
Spoke with patient's wife.

## 2012-09-08 ENCOUNTER — Ambulatory Visit (INDEPENDENT_AMBULATORY_CARE_PROVIDER_SITE_OTHER): Payer: Medicare Other | Admitting: Cardiology

## 2012-09-08 ENCOUNTER — Encounter: Payer: Self-pay | Admitting: Cardiology

## 2012-09-08 VITALS — BP 112/54 | HR 58 | Ht 70.0 in | Wt 191.0 lb

## 2012-09-08 DIAGNOSIS — I25810 Atherosclerosis of coronary artery bypass graft(s) without angina pectoris: Secondary | ICD-10-CM

## 2012-09-08 DIAGNOSIS — I359 Nonrheumatic aortic valve disorder, unspecified: Secondary | ICD-10-CM

## 2012-09-08 DIAGNOSIS — R5383 Other fatigue: Secondary | ICD-10-CM

## 2012-09-08 DIAGNOSIS — I509 Heart failure, unspecified: Secondary | ICD-10-CM

## 2012-09-08 DIAGNOSIS — R0609 Other forms of dyspnea: Secondary | ICD-10-CM

## 2012-09-08 DIAGNOSIS — I5032 Chronic diastolic (congestive) heart failure: Secondary | ICD-10-CM

## 2012-09-08 DIAGNOSIS — I6529 Occlusion and stenosis of unspecified carotid artery: Secondary | ICD-10-CM

## 2012-09-08 DIAGNOSIS — R0683 Snoring: Secondary | ICD-10-CM

## 2012-09-08 DIAGNOSIS — E7800 Pure hypercholesterolemia, unspecified: Secondary | ICD-10-CM

## 2012-09-08 DIAGNOSIS — R5381 Other malaise: Secondary | ICD-10-CM

## 2012-09-08 NOTE — Patient Instructions (Addendum)
Your physician has recommended that you have a sleep study. This test records several body functions during sleep, including: brain activity, eye movement, oxygen and carbon dioxide blood levels, heart rate and rhythm, breathing rate and rhythm, the flow of air through your mouth and nose, snoring, body muscle movements, and chest and belly movement.    Your physician recommends that you schedule a follow-up appointment in: 3 months with Dr Shirlee Latch.  Your physician recommends that you return for lab work in: 3 months when you see Dr Frutoso Chase.

## 2012-09-09 NOTE — Progress Notes (Signed)
Patient ID: JONNATHAN BIRMAN, male   DOB: [birth year], 79 y.o.   MRN: [id number] PCP: Dr Patsy Lager  79 yo with history of CAD s/p CABG and moderate aortic stenosis returns for evaluation of dyspnea.   He has a history of chronic stable angina for years.  This pattern has not changed.  He gets chest tightness after walking fast for about 100 yards.    Last month, he reported increased dyspnea from baseline and orthopnea.  He looked volume overloaded on exam.  Echo showed moderate LVH, normal EF, and moderate AS.  BNP was elevated. I started him on Lasix 40 mg daily.  Since then, he is breathing better.  Coughing and wheezing have resolved.  Orthopnea seems to have resolved.  He can walk several hundred feet now without stopping.  Weight is down 4 lbs. He does continue to have generalized fatigue.  His wife says that he snores loudly at night.   Labs (11/13): K 4.4, creatinine 1.3, LDL 66, HDL 53 Labs (7/14): BNP 590 Labs (8/14): K 4.4, creatinine 1.4, BNP 218, LDL 53, HDL 43  PMH: 1. CAD: s/p CABG in 1991 with LIMA-LAD, SVG-PDA, seq SVG to OM1,2,3.  LHC 2004 complicated by cholesterol atheroemboli and AKI.  One limb of seq SVG-OM1,2,3 known to be occluded.  Chronic stable angina.   2. Carotid stenosis: 7/13 carotid dopplers 40-59% on right, 60-79% on left.  Carotids (1/14) with 40-59% RICA, 60-79% LICA.  3. GERD 4. Polyps 5. Thyroid nodule 6. S/p excision of acoustic neuroma 7. Prostate cancer treated with seed implantation in 2002 8. COPD: mild 9. Cholecystectomy 10. CKD 11. Aortic stenosis: Moderate.  Echo (2/14) with EF 50-55%, mild LVH, moderate AS with AVA 1.3 cm^2 and mean gradient 23 mmHg.  Echo (8/14) with EF 55-60%, moderate LVH, moderate AS with mean gradient 27 mmHg and AVA 1.03 cm^2, mild to moderate AI, mild MR.  12. Chronic diastolic CHF.  13. 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  FH: CAD  ROS: All systems reviewed and  negative except as per HPI.   Current Outpatient Prescriptions  Medication Sig Dispense Refill  . aspirin 325 MG tablet Take 325 mg by mouth daily.      . B Complex-C-Folic Acid (NEPHRO-VITE PO) Take 1 tablet by mouth daily.        . cholecalciferol (VITAMIN D) 400 UNITS TABS Take 400 Units by mouth.      . furosemide (LASIX) 40 MG tablet Take 1 tablet (40 mg total) by mouth daily.  30 tablet  3  . isosorbide mononitrate (IMDUR) 60 MG 24 hr tablet Take 1 tablet (60 mg total) by mouth daily.  90 tablet  3  . metoprolol succinate (TOPROL-XL) 25 MG 24 hr tablet Take 1 tablet (25 mg total) by mouth daily.  90 tablet  3  . Multiple Vitamins-Minerals (RA VISION-VITE PRESERVE PO) Take 2 capsules by mouth daily.        . nitroGLYCERIN (NITROLINGUAL) 0.4 MG/SPRAY spray Place 1 spray under the tongue every 5 (five) minutes as needed.  12 g  6  . omeprazole (PRILOSEC) 20 MG capsule Take 20 mg by mouth daily.        . potassium chloride SA (K-DUR,KLOR-CON) 20 MEQ tablet Take 1 tablet (20 mEq total) by mouth daily.  30 tablet  3  . pravastatin (PRAVACHOL) 40 MG tablet Take 1 tablet (40 mg total) by mouth daily.  90 tablet  3   No current facility-administered medications for this visit.    BP 112/54  Pulse 58  Ht 5\' 10"  (1.778 m)  Wt 86.637 kg (191 lb)  BMI 27.41 kg/m2  SpO2 97% General: NAD Neck: No JVD, no thyromegaly or thyroid nodule.  Lungs: Slight crackles at the bases bilaterally.  CV: Nondisplaced PMI.  Heart regular S1/S2, no S3/S4, 2/6 SEM RUSB with clear S2.  No edema.  No carotid bruit.  Normal pedal pulses.  Abdomen: Soft, nontender, no hepatosplenomegaly, no distention.  Skin: Intact without lesions or rashes.  Neurologic: Alert and oriented x 3.  Psych: Normal affect. Extremities: No clubbing or cyanosis.   Assessment/Plan: 1. CAD: s/p CABG with long history of chronic stable angina.  Chest pain pattern has not changed any recently.  I do not think that stress testing at this  time would be helpful. Continue ASA, Imdur, Toprol XL, statin.  2. Chronic diastolic CHF: Symptoms improved (now NYHA class II) with Lasix.  He does not appear volume overloaded on exam.  Weight is down.  - Continue current Lasix and KCl.  Creatinine stable. 3. Hyperlipidemia: Good lipids.  Continue statin.  4. CKD: Stable creatinine with diuresis.   5. Aortic stenosis: Moderate.   6. Carotid stenosis: Followed by [facility].  7. OSA: Patient snores loudly and is fatigued during the day.  I will arrange for a sleep study.   Followup in 3 months with a BMET.   [name] 09/09/2012

## 2012-09-15 ENCOUNTER — Ambulatory Visit: Payer: Medicare Other | Admitting: Cardiology

## 2012-09-30 ENCOUNTER — Ambulatory Visit: Payer: Medicare Other | Admitting: Cardiology

## 2012-10-13 ENCOUNTER — Ambulatory Visit (HOSPITAL_BASED_OUTPATIENT_CLINIC_OR_DEPARTMENT_OTHER): Payer: Medicare Other | Attending: Cardiology

## 2012-10-13 VITALS — Ht 70.0 in | Wt 190.0 lb

## 2012-10-13 DIAGNOSIS — R0683 Snoring: Secondary | ICD-10-CM

## 2012-10-13 DIAGNOSIS — R5383 Other fatigue: Secondary | ICD-10-CM

## 2012-10-13 DIAGNOSIS — I5032 Chronic diastolic (congestive) heart failure: Secondary | ICD-10-CM

## 2012-10-13 DIAGNOSIS — G4733 Obstructive sleep apnea (adult) (pediatric): Secondary | ICD-10-CM

## 2012-10-30 DIAGNOSIS — R0989 Other specified symptoms and signs involving the circulatory and respiratory systems: Secondary | ICD-10-CM

## 2012-10-30 DIAGNOSIS — R5381 Other malaise: Secondary | ICD-10-CM

## 2012-10-30 DIAGNOSIS — G4733 Obstructive sleep apnea (adult) (pediatric): Secondary | ICD-10-CM

## 2012-10-30 DIAGNOSIS — R0609 Other forms of dyspnea: Secondary | ICD-10-CM

## 2012-10-30 DIAGNOSIS — I5032 Chronic diastolic (congestive) heart failure: Secondary | ICD-10-CM

## 2012-10-30 DIAGNOSIS — R5383 Other fatigue: Secondary | ICD-10-CM

## 2012-10-31 NOTE — Procedures (Signed)
Andrew [name], Andrew [name]                 ACCOUNT NO.:  [id number]  MEDICAL RECORD NO.:  [id number]          PATIENT TYPE:  OUT  LOCATION:  SLEEP CENTER                 FACILITY:  [facility]  PHYSICIAN:  Barbaraann Share, MD,FCCPDATE OF BIRTH:  04/23/1925  DATE OF STUDY:  10/13/2012                           NOCTURNAL POLYSOMNOGRAM  REFERRING PHYSICIAN:  [name], MD  INDICATION FOR STUDY:  Hypersomnia with sleep apnea.  EPWORTH SLEEPINESS SCORE:  8.  MEDICATIONS:  SLEEP ARCHITECTURE:  The patient had a total sleep time of 249 minutes with no slow-wave sleep and only 44 minutes of REM.  Sleep onset latency was abnormal at 49 minutes and REM onset was prolonged at 131 minutes. Sleep efficiency was poor at 62%.  RESPIRATORY DATA:  The patient was found to have 9 obstructive and central apneas as well as 56 obstructive hypopneas, giving him an apnea- hypopnea index of 16 events per hour.  The events occurred in all body positions, but were more pronounced during REM.  Loud snoring was noted throughout.  The patient did not meet split-night protocol secondary to the small numbers of events prior to 2 a.m.  OXYGEN DATA:  There was O2 desaturation as low as 88% with his obstructive events.  CARDIAC DATA:  PVCs were noted during the night.  MOVEMENT-PARASOMNIA:  The patient had 216 limb movements with 1.44 per hour resulting in arousal or awakening.  There were no abnormal behaviors seen.  IMPRESSIONS-RECOMMENDATIONS: 1. Mild-to-moderate obstructive sleep apnea/hypopnea syndrome, with an     AHI of 16 events per hour and oxygen desaturation as low as 88%.     Treatment for this degree of sleep apnea can include a trial of     weight loss alone, upper airway surgery, dental appliance, and also     CPAP.  Clinical correlation is suggested. 2. PVCs noted throughout the night, but no clinically significant     arrhythmias were seen. 3. Very large numbers of leg jerks, but very little sleep  disruption noted.        Clinical correlation is suggested.     Barbaraann Share, MD,FCCP Diplomate, American Board of Sleep Medicine    [facility]/MEDQ  D:  10/30/2012 08:31:24  T:  10/31/2012 01:47:06  Job:  [id number]

## 2012-11-03 ENCOUNTER — Telehealth: Payer: Self-pay | Admitting: *Deleted

## 2012-11-03 ENCOUNTER — Other Ambulatory Visit: Payer: Self-pay | Admitting: *Deleted

## 2012-11-03 DIAGNOSIS — G4730 Sleep apnea, unspecified: Secondary | ICD-10-CM

## 2012-11-03 NOTE — Telephone Encounter (Signed)
Pt's wife advised of , agreed with Dr Alford Highland recommendation to see Dr Shelle Iron.

## 2012-11-03 NOTE — Telephone Encounter (Signed)
Mild to moderate OSA, would have him followup with Dr. Shelle Iron ----- Message ----- From: Barbaraann Share, MD Sent: 10/31/2012 9:10 AM To: Laurey Morale, MD, Hannah Beat, MD  11/03/12 LMTCB

## 2012-11-10 ENCOUNTER — Institutional Professional Consult (permissible substitution): Payer: Medicare Other | Admitting: Pulmonary Disease

## 2012-12-15 ENCOUNTER — Other Ambulatory Visit (INDEPENDENT_AMBULATORY_CARE_PROVIDER_SITE_OTHER): Payer: Medicare Other

## 2012-12-15 DIAGNOSIS — R5381 Other malaise: Secondary | ICD-10-CM

## 2012-12-15 DIAGNOSIS — I5032 Chronic diastolic (congestive) heart failure: Secondary | ICD-10-CM

## 2012-12-15 DIAGNOSIS — R5383 Other fatigue: Secondary | ICD-10-CM

## 2012-12-15 DIAGNOSIS — R0683 Snoring: Secondary | ICD-10-CM

## 2012-12-15 DIAGNOSIS — R0609 Other forms of dyspnea: Secondary | ICD-10-CM

## 2012-12-15 LAB — BASIC METABOLIC PANEL
CO2: 29 mEq/L (ref 19–32)
GFR: 57.43 mL/min — ABNORMAL LOW (ref >60.00)
Glucose, Bld: 87 mg/dL (ref 70–99)
Potassium: 4.1 mEq/L (ref 3.5–5.1)
Sodium: 139 mEq/L (ref 135–145)

## 2012-12-17 ENCOUNTER — Encounter: Payer: Self-pay | Admitting: Cardiology

## 2012-12-17 ENCOUNTER — Ambulatory Visit (INDEPENDENT_AMBULATORY_CARE_PROVIDER_SITE_OTHER): Payer: Medicare Other | Admitting: Cardiology

## 2012-12-17 ENCOUNTER — Encounter: Payer: Self-pay | Admitting: *Deleted

## 2012-12-17 VITALS — BP 132/64 | HR 62 | Ht 70.0 in | Wt 194.0 lb

## 2012-12-17 DIAGNOSIS — I25810 Atherosclerosis of coronary artery bypass graft(s) without angina pectoris: Secondary | ICD-10-CM

## 2012-12-17 DIAGNOSIS — I6529 Occlusion and stenosis of unspecified carotid artery: Secondary | ICD-10-CM

## 2012-12-17 DIAGNOSIS — I359 Nonrheumatic aortic valve disorder, unspecified: Secondary | ICD-10-CM

## 2012-12-17 DIAGNOSIS — E7800 Pure hypercholesterolemia, unspecified: Secondary | ICD-10-CM

## 2012-12-17 DIAGNOSIS — I5032 Chronic diastolic (congestive) heart failure: Secondary | ICD-10-CM

## 2012-12-17 DIAGNOSIS — I509 Heart failure, unspecified: Secondary | ICD-10-CM

## 2012-12-17 DIAGNOSIS — R079 Chest pain, unspecified: Secondary | ICD-10-CM

## 2012-12-17 MED ORDER — RANOLAZINE ER 500 MG PO TB12: 500.0000 mg | ORAL_TABLET | Freq: Two times a day (BID) | ORAL | Status: DC

## 2012-12-17 NOTE — Progress Notes (Signed)
Patient ID: Andrew [name], male   DOB: [birth year], 79 y.o.   MRN: [id number] PCP: Dr Patsy Lager  79 yo with history of CAD s/p CABG and moderate aortic stenosis returns for followup.   He has had a history of chronic stable angina for years.  He has chronically had chest tightness after walking fast for about 100 yards.  However, over the last month, he has been having more chest pain.  He now will get chest pain after walking about 50 yards to the mailbox or going up a flight of steps.  He also sometimes gets pain after a large meal.  He will have chest pain about every other day now.  Prior to a month ago, chest pain happened 1-2 times a week.  Pain resolves with rest and/or NTG spray.  Of note, he has been under more stress over the last month.  He has been having a lot of trouble with his eyesight (macular degeneration)  I recently started him on Lasix for exertional dyspnea and volume overload.  Currently, he does not note significant dyspnea but is not very active.  No orthopnea.  He continues Lasix 40 mg daily.  Weight is up 3 lbs.   ECG: NSR, LVH, inferior T wave inversions, QT normal  Labs (11/13): K 4.4, creatinine 1.3, LDL 66, HDL 53 Labs (7/14): BNP 590 Labs (8/14): K 4.4, creatinine 1.4, BNP 218, LDL 53, HDL 43 Labs (11/14): K 4.1, creatinine 1.3  PMH: 1. CAD: s/p CABG in 1991 with LIMA-LAD, SVG-PDA, seq SVG to OM1,2,3.  LHC 2004 complicated by cholesterol atheroemboli and AKI.  One limb of seq SVG-OM1,2,3 known to be occluded.  Chronic stable angina.   2. Carotid stenosis: 7/13 carotid dopplers 40-59% on right, 60-79% on left.  Carotids (1/14) with 40-59% RICA, 60-79% LICA.  3. GERD 4. Polyps 5. Thyroid nodule 6. S/p excision of acoustic neuroma 7. Prostate cancer treated with seed implantation in 2002 8. COPD: mild 9. Cholecystectomy 10. CKD 11. Aortic stenosis: Moderate.  Echo (2/14) with EF 50-55%, mild LVH, moderate AS with AVA 1.3 cm^2 and mean gradient 23 mmHg.  Echo (8/14)  with EF 55-60%, moderate LVH, moderate AS with mean gradient 27 mmHg and AVA 1.03 cm^2, mild to moderate AI, mild MR.  12. Chronic diastolic CHF.  13. Macular degeneration 14. Mild to moderate OSA on sleep study.   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   Current Outpatient Prescriptions  Medication Sig Dispense Refill  . aspirin 325 MG tablet Take 325 mg by mouth daily.      . B Complex-C-Folic Acid (NEPHRO-VITE PO) Take 1 tablet by mouth daily.        . cholecalciferol (VITAMIN D) 400 UNITS TABS Take 400 Units by mouth.      . furosemide (LASIX) 40 MG tablet Take 1 tablet (40 mg total) by mouth daily.  30 tablet  3  . isosorbide mononitrate (IMDUR) 60 MG 24 hr tablet Take 1 tablet (60 mg total) by mouth daily.  90 tablet  3  . metoprolol succinate (TOPROL-XL) 25 MG 24 hr tablet Take 1 tablet (25 mg total) by mouth daily.  90 tablet  3  . Multiple Vitamins-Minerals (RA VISION-VITE PRESERVE PO) Take 2 capsules by mouth daily.        . nitroGLYCERIN (NITROLINGUAL) 0.4 MG/SPRAY spray Place 1 spray under the tongue every  5 (five) minutes as needed.  12 g  6  . omeprazole (PRILOSEC) 20 MG capsule Take 20 mg by mouth daily.        . potassium chloride SA (K-DUR,KLOR-CON) 20 MEQ tablet Take 1 tablet (20 mEq total) by mouth daily.  30 tablet  3  . pravastatin (PRAVACHOL) 40 MG tablet Take 1 tablet (40 mg total) by mouth daily.  90 tablet  3  . ranolazine (RANEXA) 500 MG 12 hr tablet Take 1 tablet (500 mg total) by mouth 2 (two) times daily.  60 tablet  4   No current facility-administered medications for this visit.    BP 132/64  Pulse 62  Ht 5\' 10"  (1.778 m)  Wt 194 lb (87.998 kg)  BMI 27.84 kg/m2 General: NAD Neck: JVP 7-8 cm, no thyromegaly or thyroid nodule.  Lungs: Slight crackles at the bases bilaterally.  CV: Nondisplaced PMI.  Heart regular S1/S2, no S3/S4, 2/6 SEM RUSB with  clear S2.  Trace ankle edema.  bilateral carotid bruits.  Normal pedal pulses.  Abdomen: Soft, nontender, no hepatosplenomegaly, no distention.  Skin: Intact without lesions or rashes.  Neurologic: Alert and oriented x 3.  Psych: Normal affect. Extremities: No clubbing or cyanosis.   Assessment/Plan: 1. CAD: s/p CABG with long history of chronic stable angina.  Recently, he has been having exertional chest pain more often and with less exertion (50 yards versus 100 yards).  Pain resolves with rest or NTG.  He had a very difficult time with last cardiac cath (cholesterol atheroemboli and AKI) and wants cath only as last resort.   - Continue Imdur and Toprol XL for angina.  - I will add Ranexa 500 mg bid with ECG in 2 wks.  - Continue ASA and statin.  - Lexiscan Cardiolite: If high risk, he will need cardiac cath.  Otherwise, will continue to try medical management.  He and his wife know to come to the ER for significant rest pain or pain that does not resolve after 2 NTG sprays.  2. Chronic diastolic CHF: He does not appear significantly volume overloaded on exam.  - Continue current Lasix and KCl.  Creatinine stable. 3. Hyperlipidemia: Good lipids.  Continue statin.  4. CKD: Stable creatinine with diuresis. Follow closely.  5. Aortic stenosis: Moderate, repeat echo in 8/15.   6. Carotid stenosis: Followed by [facility].  7. OSA: Mild to moderate OSA on sleep study.  To see Dr. Shelle Iron.    [name] 12/17/2012

## 2012-12-17 NOTE — Patient Instructions (Signed)
Start Ranexa 500mg  every 12 hours (two times a day).  Your physician has requested that you have a lexiscan myoview. For further information please visit [email or web address] Please follow instruction sheet, as given.  Your physician recommends that you schedule a follow-up appointment with EKG in: 2 weeks with Dr Shirlee Latch.

## 2012-12-22 ENCOUNTER — Ambulatory Visit (HOSPITAL_COMMUNITY): Payer: Medicare Other | Attending: Internal Medicine | Admitting: Radiology

## 2012-12-22 ENCOUNTER — Encounter: Payer: Self-pay | Admitting: Cardiology

## 2012-12-22 VITALS — BP 110/66 | Ht 70.0 in | Wt 191.0 lb

## 2012-12-22 DIAGNOSIS — R079 Chest pain, unspecified: Secondary | ICD-10-CM | POA: Insufficient documentation

## 2012-12-22 DIAGNOSIS — I509 Heart failure, unspecified: Secondary | ICD-10-CM | POA: Insufficient documentation

## 2012-12-22 DIAGNOSIS — E785 Hyperlipidemia, unspecified: Secondary | ICD-10-CM | POA: Insufficient documentation

## 2012-12-22 DIAGNOSIS — I5032 Chronic diastolic (congestive) heart failure: Secondary | ICD-10-CM

## 2012-12-22 DIAGNOSIS — Z951 Presence of aortocoronary bypass graft: Secondary | ICD-10-CM | POA: Insufficient documentation

## 2012-12-22 DIAGNOSIS — Z8249 Family history of ischemic heart disease and other diseases of the circulatory system: Secondary | ICD-10-CM | POA: Insufficient documentation

## 2012-12-22 DIAGNOSIS — J449 Chronic obstructive pulmonary disease, unspecified: Secondary | ICD-10-CM | POA: Insufficient documentation

## 2012-12-22 DIAGNOSIS — R0602 Shortness of breath: Secondary | ICD-10-CM

## 2012-12-22 DIAGNOSIS — R0609 Other forms of dyspnea: Secondary | ICD-10-CM | POA: Insufficient documentation

## 2012-12-22 DIAGNOSIS — R0989 Other specified symptoms and signs involving the circulatory and respiratory systems: Secondary | ICD-10-CM | POA: Insufficient documentation

## 2012-12-22 DIAGNOSIS — Z87891 Personal history of nicotine dependence: Secondary | ICD-10-CM | POA: Insufficient documentation

## 2012-12-22 DIAGNOSIS — J4489 Other specified chronic obstructive pulmonary disease: Secondary | ICD-10-CM | POA: Insufficient documentation

## 2012-12-22 DIAGNOSIS — I2510 Atherosclerotic heart disease of native coronary artery without angina pectoris: Secondary | ICD-10-CM | POA: Insufficient documentation

## 2012-12-22 DIAGNOSIS — I779 Disorder of arteries and arterioles, unspecified: Secondary | ICD-10-CM | POA: Insufficient documentation

## 2012-12-22 MED ORDER — REGADENOSON 0.4 MG/5ML IV SOLN
0.4000 mg | Freq: Once | INTRAVENOUS | Status: AC
  Administered 2012-12-22: 0.4 mg via INTRAVENOUS

## 2012-12-22 MED ORDER — TECHNETIUM TC 99M SESTAMIBI GENERIC - CARDIOLITE
10.0000 | Freq: Once | INTRAVENOUS | Status: AC | PRN
  Administered 2012-12-22: 10 via INTRAVENOUS

## 2012-12-22 MED ORDER — TECHNETIUM TC 99M SESTAMIBI GENERIC - CARDIOLITE
30.0000 | Freq: Once | INTRAVENOUS | Status: AC | PRN
  Administered 2012-12-22: 30 via INTRAVENOUS

## 2012-12-22 NOTE — Progress Notes (Addendum)
[name] SITE 3 NUCLEAR MED [address] [place], [place] [address] [phone]    Cardiology Nuclear Med Study  Andrew [name] is a 79 y.o. male     MRN : [id number]     DOB: January 24, 1923  Procedure Date: 12/22/2012  Nuclear Med Background Indication for Stress Test:  Evaluation for Ischemia and Graft Patency History:CAD,CABG,CHF,ECHO 08/28/12 EF 55-65%,Previous Nuclear Study ?,COPD (MILD) Cardiac Risk Factors: Carotid Disease, Family History - CAD, History of Smoking and Lipids  Symptoms:  Chest Pain and DOE   Nuclear Pre-Procedure Caffeine/Decaff Intake:  None NPO After: 9:30pm   Lungs:  clear O2 Sat: 95% on room air. IV 0.9% NS with Angio Cath:  22g  IV Site: R Hand  IV Started by:  Cathlyn Parsons, RN  Chest Size (in):  46 Cup Size: n/a  Height: 5\' 10"  (1.778 m)  Weight:  191 lb (86.637 kg)  BMI:  Body mass index is 27.41 kg/(m^2). Tech Comments:  No Toprol x 24 hrs    Nuclear Med Study 1 or 2 day study: 1 day  Stress Test Type:  Lexiscan  Reading MD: Willa Rough, MD  Order Authorizing Provider:  Fransico Meadow  Resting Radionuclide: Technetium 52m Sestamibi  Resting Radionuclide Dose: 11.0 mCi   Stress Radionuclide:  Technetium 34m Sestamibi  Stress Radionuclide Dose: 33.0 mCi           Stress Protocol Rest HR: 57 Stress HR: 74  Rest BP: 110/66 Stress BP: 114/66  Exercise Time (min): n/a METS: n/a   Predicted Max HR: 133 bpm % Max HR: 55.64 bpm Rate Pressure Product: 8436   Dose of Adenosine (mg):  n/a Dose of Lexiscan: 0.4 mg  Dose of Atropine (mg): n/a Dose of Dobutamine: n/a mcg/kg/min (at max HR)  Stress Test Technologist: Frederick Peers, EMT-P  Nuclear Technologist:  Doyne Keel, CNMT     Rest Procedure:  Myocardial perfusion imaging was performed at rest 45 minutes following the intravenous administration of Technetium 50m Sestamibi. Rest ECG:  Normal sinus rhythm. Mild nonspecific T wave abnormalities  Stress Procedure:  The  patient received IV Lexiscan 0.4 mg over 15-seconds.  Technetium 55m Sestamibi injected at 30-seconds.  Quantitative spect images were obtained after a 45 minute delay. Stress ECG: No significant change from baseline ECG  QPS Raw Data Images:  Normal; no motion artifact; normal heart/lung ratio. Stress Images:  There is a medium-sized area with moderate decreased uptake affecting the basal inferior, base/mid inferolateral, base/mid anterolateral, apical lateral segments. Rest Images:  Small area of mild/moderate decreased uptake affecting the basal inferior, base and mid inferolateral, base anterolateral segments. Subtraction (SDS):  Mild ischemia in the mid inferolateral, mid anterolateral, apical lateral segments Transient Ischemic Dilatation (Normal <1.22):  0.94 Lung/Heart Ratio (Normal <0.45):  0.37  Quantitative Gated Spect Images QGS EDV:  125 ml QGS ESV:  73 ml  Impression Exercise Capacity:  Lexiscan with no exercise. BP Response:  Normal blood pressure response. Clinical Symptoms:  Patient felt lightheaded ECG Impression:  No significant ST segment change suggestive of ischemia. Comparison with Prior Nuclear Study: No images to compare  Overall Impression:  Study is abnormal. There is a medium-sized scar affecting the lateral wall with mild peri-infarct ischemia.  The segments are outlined above. This is a low risk scan.  LV Ejection Fraction: 42%.  LV Wall Motion:  Hypokinesis of the inferolateral wall.  Willa Rough, MD  Scar but no ischemia.  Would not cath at this time,  continue medical management.   [name] 12/23/2012

## 2012-12-23 ENCOUNTER — Institutional Professional Consult (permissible substitution): Payer: Medicare Other | Admitting: Pulmonary Disease

## 2012-12-23 ENCOUNTER — Telehealth: Payer: Self-pay | Admitting: Nurse Practitioner

## 2012-12-23 NOTE — Telephone Encounter (Signed)
Left message on patient's home phone to call office for stress test results.

## 2012-12-23 NOTE — Telephone Encounter (Signed)
Reviewed results and plan of care with patient.  Patient wondering if scar could have come from bypass surgery.  I advised patient to write down his questions to review with Dr. Shirlee Latch at his appointment on 12/15.  Patient verbalized agreement and understanding.

## 2012-12-23 NOTE — Telephone Encounter (Signed)
Follow Up  ° °Pt returned call for results °

## 2012-12-24 NOTE — Progress Notes (Signed)
Pt.notified

## 2012-12-26 ENCOUNTER — Ambulatory Visit (INDEPENDENT_AMBULATORY_CARE_PROVIDER_SITE_OTHER): Payer: Medicare Other | Admitting: Pulmonary Disease

## 2012-12-26 ENCOUNTER — Encounter: Payer: Self-pay | Admitting: Pulmonary Disease

## 2012-12-26 VITALS — BP 124/62 | HR 60 | Temp 98.2°F | Ht 68.0 in | Wt 196.0 lb

## 2012-12-26 DIAGNOSIS — G4733 Obstructive sleep apnea (adult) (pediatric): Secondary | ICD-10-CM

## 2012-12-26 NOTE — Patient Instructions (Signed)
Consider treatment of your sleep apnea with weight loss alone vs cpap trial while working on weight reduction.  Let us know what you decide.

## 2012-12-26 NOTE — Assessment & Plan Note (Signed)
The patient has been found to have mild obstructive sleep apnea by his recent sleep study. He feels that he sleeps well and is only mildly symptomatic during the day. However, this is disrupting his wife's sleep, and he has underlying cardiac disease that may or may not be affected by mild sleep apnea.  It had a long discussion with him about sleep apnea, including its effect on quality of life and potentially cardiovascular health. I have outlined a conservative approach with weight loss alone, versus more aggressive treatment with a dental appliance or CPAP while working on weight loss. The patient is not sure that he wants to try CPAP, and would like to take some time to consider. I have given him some educational material on sleep apnea, and have asked him to call me back once he has made his decision.

## 2012-12-26 NOTE — Progress Notes (Signed)
Subjective:    Patient ID: Andrew [name], male    DOB: August 20, 1923, 79 y.o.   MRN: [id number]  HPI The patient is an 79 year old male who I've been asked to see for management of obstructive sleep apnea. He has had a recent sleep study that showed mild OSA, with an AHI of 16 events per hour and a mild transient oxygen desaturation. The patient has been noted to have loud snoring by his wife, but she has never seen an abnormal breathing pattern during sleep. The patient notes a rare gasp episode that is less than once a week. He rarely awakens during the night, and feels that he has rested whenever he arises in the morning. However, he does note some sleepiness during the day with inactivity, and may take 1 or 2 naps a day. He also will fall asleep in the evenings watching television according to the wife. He currently does not drive. It should be noted the patient is totally satisfied with the quality of his sleep and his alertness level during the day. His Epworth score is only 5.   Sleep Questionnaire What time do you typically go to bed?( Between what hours) 12am 12am at 1121 on 12/26/12 by Raford Pitcher, RN How long does it take you to fall asleep? 5 minutes 5 minutes at 1121 on 12/26/12 by Raford Pitcher, RN How many times during the night do you wake up? 11 1-2 at 1121 on 12/26/12 by Raford Pitcher, RN What time do you get out of bed to start your day? 0900 0900 at 1121 on 12/26/12 by Raford Pitcher, RN Do you drive or operate heavy machinery in your occupation? No No at 1121 on 12/26/12 by Raford Pitcher, RN How much has your weight changed (up or down) over the past two years? (In pounds) 2 lb (0.907 kg)2 lb (0.907 kg) down at 1121 on 12/26/12 by Raford Pitcher, RN Have you ever had a sleep study before? Yes Yes at 1121 on 12/26/12 by Raford Pitcher, RN If yes, location of study? [facility] at 1121 on 12/26/12 by Raford Pitcher, RN  If yes, date of study? Nov 2014 Nov 2014 at 1121 on 12/26/12 by Raford Pitcher, RN Do you currently use CPAP? No No at 1121 on 12/26/12 by Raford Pitcher, RN Do you wear oxygen at any time? No No at 1121 on 12/26/12 by Raford Pitcher, RN   Review of Systems  Constitutional: Negative for fever and unexpected weight change.  HENT: Positive for sneezing. Negative for congestion, dental problem, ear pain, nosebleeds, postnasal drip, rhinorrhea, sinus pressure, sore throat and trouble swallowing.   Eyes: Negative for redness and itching.  Respiratory: Positive for cough and shortness of breath. Negative for chest tightness and wheezing.   Cardiovascular: Positive for chest pain. Negative for palpitations and leg swelling.  Gastrointestinal: Negative for nausea and vomiting.  Genitourinary: Negative for dysuria.  Musculoskeletal: Negative for joint swelling.  Skin: Negative for rash.  Neurological: Negative for headaches.  Hematological: Does not bruise/bleed easily.  Psychiatric/Behavioral: Negative for dysphoric mood. The patient is not nervous/anxious.        Objective:   Physical Exam Constitutional:  Well developed, no acute distress  HENT:  Nares patent without discharge  Oropharynx without exudate, palate and uvula are mildly elongated.   Eyes:  Perrla, eomi, no scleral icterus  Neck:  No JVD, no TMG  Cardiovascular:  Normal rate,  regular rhythm, no rubs or gallops.  3/6 sem        Intact distal pulses but decreased.  Pulmonary :  Normal breath sounds, no stridor or respiratory distress   No rales, rhonchi, or wheezing  Abdominal:  Soft, nondistended, bowel sounds present.  No tenderness noted.   Musculoskeletal:  mild lower extremity edema noted.  Lymph Nodes:  No cervical lymphadenopathy noted  Skin:  No cyanosis noted  Neurologic:  Alert, appropriate, moves all 4 extremities without obvious deficit.         Assessment & Plan:

## 2013-01-05 ENCOUNTER — Other Ambulatory Visit: Payer: Self-pay | Admitting: Cardiology

## 2013-01-05 ENCOUNTER — Ambulatory Visit (INDEPENDENT_AMBULATORY_CARE_PROVIDER_SITE_OTHER): Payer: Medicare Other | Admitting: Cardiology

## 2013-01-05 ENCOUNTER — Encounter: Payer: Self-pay | Admitting: Cardiology

## 2013-01-05 VITALS — BP 138/58 | HR 62 | Ht 68.5 in | Wt 197.0 lb

## 2013-01-05 DIAGNOSIS — E7800 Pure hypercholesterolemia, unspecified: Secondary | ICD-10-CM

## 2013-01-05 DIAGNOSIS — I6529 Occlusion and stenosis of unspecified carotid artery: Secondary | ICD-10-CM

## 2013-01-05 DIAGNOSIS — R079 Chest pain, unspecified: Secondary | ICD-10-CM

## 2013-01-05 DIAGNOSIS — I5032 Chronic diastolic (congestive) heart failure: Secondary | ICD-10-CM

## 2013-01-05 DIAGNOSIS — I25810 Atherosclerosis of coronary artery bypass graft(s) without angina pectoris: Secondary | ICD-10-CM

## 2013-01-05 DIAGNOSIS — I359 Nonrheumatic aortic valve disorder, unspecified: Secondary | ICD-10-CM

## 2013-01-05 DIAGNOSIS — I509 Heart failure, unspecified: Secondary | ICD-10-CM

## 2013-01-05 MED ORDER — FUROSEMIDE 40 MG PO TABS: ORAL_TABLET | ORAL | Status: DC

## 2013-01-05 MED ORDER — POTASSIUM CHLORIDE CRYS ER 20 MEQ PO TBCR: 20.0000 meq | EXTENDED_RELEASE_TABLET | Freq: Every day | ORAL | Status: DC

## 2013-01-05 NOTE — Patient Instructions (Signed)
Increase lasix (furosemide) to 40mg  in the AM and 20mg  in the PM. This will be 1 of your 40mg  tablets in the AM and 1/2 of your 40mg  tablets in the PM.  Your physician recommends that you return for lab work in: 2 weeks--BMET/BNP.   Your physician recommends that you schedule a follow-up appointment in: 2 months with Dr Shirlee Latch.

## 2013-01-05 NOTE — Progress Notes (Signed)
Patient ID: Andrew [name], male   DOB: Jan 31, 1923, 79 y.o.   MRN: [id number] PCP: Dr Patsy Lager  79 yo with history of CAD s/p CABG and moderate aortic stenosis returns for followup.   He has had a history of chronic stable angina for years.  He has chronically had chest tightness after walking fast for about 100 yards.  When I saw him about a month ago, he reported increased exertional chest pain.  I had him get a [employer] which showed EF 42% with medium-sized scar in the lateral wall along with mild peri-infarct ischemia.  This was deemed a low risk study.  I started the patient on ranolazine. Since then, the frequency of angina has significantly decreased.  He has had chest pain only once recently, when he was carrying grocery bags up stairs.   Though chest pain is improved on ranolazine, he still has exertional dyspnea.  He is short of breath after walking < 1 block or up steps.  He can walk in his house without dyspnea.  No orthopnea.  Weight is up 3 lbs.    ECG: NSR, normal QT interval  Labs (11/13): K 4.4, creatinine 1.3, LDL 66, HDL 53 Labs (7/14): BNP 590 Labs (8/14): K 4.4, creatinine 1.4, BNP 218, LDL 53, HDL 43 Labs (11/14): K 4.1, creatinine 1.3  PMH: 1. CAD: s/p CABG in 1991 with LIMA-LAD, SVG-PDA, seq SVG to OM 1,2,3.  LHC 2004 complicated by cholesterol atheroemboli and AKI.  One limb of seq SVG-OM 1,2,3 known to be occluded.  Chronic stable angina.  Lexiscan Cardiolite (12/14) with EF 42%, inferolateral hypokinesis, medium-sized scar in the lateral wall with mild peri-infarct ischemia, overall low risk.  2. Carotid stenosis: 7/13 carotid dopplers 40-59% on right, 60-79% on left.  Carotids (1/14) with 40-59% RICA, 60-79% LICA.  3. GERD 4. Polyps 5. Thyroid nodule 6. S/p excision of acoustic neuroma 7. Prostate cancer treated with seed implantation in 2002 8. COPD: mild 9. Cholecystectomy 10. CKD 11. Aortic stenosis: Moderate.  Echo (2/14) with EF 50-55%, mild LVH,  moderate AS with AVA 1.3 cm^2 and mean gradient 23 mmHg.  Echo (8/14) with EF 55-60%, moderate LVH, moderate AS with mean gradient 27 mmHg and AVA 1.03 cm^2, mild to moderate AI, mild MR.  12. Chronic diastolic CHF.  13. Macular degeneration 14. Mild to moderate OSA on sleep study. He is not on CPAP.   SH: Retired Art gallery manager, quit smoking 1963, married, lives in [facility]  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   Current Outpatient Prescriptions  Medication Sig Dispense Refill  . aspirin 325 MG tablet Take 325 mg by mouth daily.      . B Complex-C-Folic Acid (NEPHRO-VITE PO) Take 1 tablet by mouth daily.        . cholecalciferol (VITAMIN D) 400 UNITS TABS Take 400 Units by mouth.      . isosorbide mononitrate (IMDUR) 60 MG 24 hr tablet Take 1 tablet (60 mg total) by mouth daily.  90 tablet  3  . metoprolol succinate (TOPROL-XL) 25 MG 24 hr tablet Take 1 tablet (25 mg total) by mouth daily.  90 tablet  3  . Multiple Vitamins-Minerals (RA VISION-VITE PRESERVE PO) Take 2 capsules by mouth daily.        . nitroGLYCERIN (NITROLINGUAL) 0.4 MG/SPRAY spray Place 1 spray under the tongue every 5 (five) minutes as needed.  12 g  6  . omeprazole (PRILOSEC) 20 MG  capsule Take 20 mg by mouth daily.        . pravastatin (PRAVACHOL) 40 MG tablet Take 1 tablet (40 mg total) by mouth daily.  90 tablet  3  . ranolazine (RANEXA) 500 MG 12 hr tablet Take 1 tablet (500 mg total) by mouth 2 (two) times daily.  60 tablet  4  . furosemide (LASIX) 40 MG tablet TAKE 1 TABLET BY MOUTH DAILY  30 tablet  3  . potassium chloride SA (K-DUR,KLOR-CON) 20 MEQ tablet TAKE 1 TABLET BY MOUTH DAILY.  30 tablet  3   No current facility-administered medications for this visit.    BP 138/58  Pulse 62  Ht 5' 8.5" (1.74 m)  Wt 89.359 kg (197 lb)  BMI 29.51 kg/m2 General: NAD Neck: JVP 8-9 cm, no thyromegaly or thyroid nodule.  Lungs: Slight crackles at the bases bilaterally.  CV: Nondisplaced  PMI.  Heart regular S1/S2, no S3/S4, 3/6 SEM RUSB with clear S2.  1+ ankle edema.  Right carotid bruit.  Normal pedal pulses.  Abdomen: Soft, nontender, no hepatosplenomegaly, no distention.  Skin: Intact without lesions or rashes.  Neurologic: Alert and oriented x 3.  Psych: Normal affect. Extremities: No clubbing or cyanosis.   Assessment/Plan: 1. CAD: s/p CABG with long history of chronic stable angina.  He had a very difficult time with last cardiac cath (cholesterol atheroemboli and AKI) and wants cath only as last resort.  At last appointment, he was having more exertional chest pain.  Lexiscan Cardiolite showed lateral MI with mild peri-infarct ischemia consistent with known occluded limb of sequential SVG-OMs (low risk).  Chest pain significantly improved with ranolazine.  - Continue Imdur, ranolazine, and Toprol XL for angina.  - ECG today to check QT interval on ranolazine.  - Continue ASA and statin.  2. Chronic diastolic CHF: EF normal on echo recently though read as 42% on Cardiolite.  He is volume overloaded on exam with NYHA class III symptoms.  - Increase Lasix to 40 qam, 20 qpm.   - BMET/BNP in 2 wks.  - Followup in 2 months.  3. Hyperlipidemia: Good lipids.  Continue statin.  4. CKD: BMET today.  5. Aortic stenosis: Moderate, repeat echo in 8/15.   6. Carotid stenosis: Followed by [facility].  7. OSA: Mild to moderate OSA on sleep study.  Not on CPAP at this point.    [name] 01/05/2013

## 2013-01-19 ENCOUNTER — Other Ambulatory Visit (INDEPENDENT_AMBULATORY_CARE_PROVIDER_SITE_OTHER): Payer: Medicare Other

## 2013-01-19 DIAGNOSIS — R079 Chest pain, unspecified: Secondary | ICD-10-CM

## 2013-01-19 DIAGNOSIS — I25810 Atherosclerosis of coronary artery bypass graft(s) without angina pectoris: Secondary | ICD-10-CM

## 2013-01-19 DIAGNOSIS — I5032 Chronic diastolic (congestive) heart failure: Secondary | ICD-10-CM

## 2013-01-19 DIAGNOSIS — I509 Heart failure, unspecified: Secondary | ICD-10-CM

## 2013-01-19 LAB — BASIC METABOLIC PANEL
CO2: 29 mEq/L (ref 19–32)
Chloride: 102 mEq/L (ref 96–112)
Creatinine, Ser: 1.6 mg/dL — ABNORMAL HIGH (ref 0.4–1.5)

## 2013-01-20 ENCOUNTER — Telehealth: Payer: Self-pay | Admitting: *Deleted

## 2013-01-20 DIAGNOSIS — I2510 Atherosclerotic heart disease of native coronary artery without angina pectoris: Secondary | ICD-10-CM

## 2013-01-20 DIAGNOSIS — R7989 Other specified abnormal findings of blood chemistry: Secondary | ICD-10-CM

## 2013-01-20 NOTE — Telephone Encounter (Signed)
Message copied by Barrie Folk on Tue Jan 20, 2013 10:29 AM ------      Message from: Laurey Morale      Created: Mon Jan 19, 2013 10:29 PM       Creatinine a bit higher after increasing Lasix.  Repeat BMET in 2 wks to make sure that no further rise occurs ------

## 2013-01-20 NOTE — Telephone Encounter (Signed)
Pt is aware of lab results & Dr. Shirlee Latch order to have a repeat BMP in 2 weeks Order placed & reminder sent to pt. Mylo Red RN

## 2013-01-26 ENCOUNTER — Ambulatory Visit
Admission: RE | Admit: 2013-01-26 | Discharge: 2013-01-26 | Disposition: A | Discharge status: Home / Self Care | Payer: Medicare Other | Source: Ambulatory Visit | Attending: Surgery | Admitting: Surgery

## 2013-01-26 ENCOUNTER — Other Ambulatory Visit: Payer: Medicare Other

## 2013-01-26 DIAGNOSIS — E041 Nontoxic single thyroid nodule: Secondary | ICD-10-CM

## 2013-01-28 ENCOUNTER — Ambulatory Visit (INDEPENDENT_AMBULATORY_CARE_PROVIDER_SITE_OTHER): Payer: Medicare Other | Admitting: Surgery

## 2013-01-28 ENCOUNTER — Encounter (INDEPENDENT_AMBULATORY_CARE_PROVIDER_SITE_OTHER): Payer: Self-pay | Admitting: Surgery

## 2013-01-28 VITALS — BP 140/76 | HR 76 | Temp 98.1°F | Resp 18 | Ht 70.0 in | Wt 195.0 lb

## 2013-01-28 DIAGNOSIS — E041 Nontoxic single thyroid nodule: Secondary | ICD-10-CM

## 2013-01-28 NOTE — Patient Instructions (Signed)

## 2013-01-28 NOTE — Progress Notes (Signed)
General [facility]  Chief Complaint  Patient presents with  . Follow-up    yearly follow up thyroid nodules    HISTORY: Patient is an 80 year old male followed for right-sided thyroid nodules for the past few years. He returns at this time for scheduled evaluation.  At my request he underwent an ultrasound on 01/26/2013. This shows a stable heterogeneous thyroid gland. There are no nodules on the left. Dominant nodule on the right measures 2.6 cm and is unchanged from prior studies. Likewise a calcified 8 mm nodule in the upper pole of the right lobe is stable. A new small 6 mm nodule is noted in the isthmus. There is no lymphadenopathy. There are no worrisome findings.  Patient also has a potential pancreatic neoplasm identified on an MRI in July 2013. This was felt to be a low risk lesion. Study was performed at [facility]. There has been no followup.  PERTINENT REVIEW OF SYSTEMS: Denies dysphagia. Denies tremor. Denies new palpable masses.  EXAM: HEENT: normocephalic; pupils equal and reactive; sclerae clear; dentition good; mucous membranes moist NECK:  Left thyroid lobe without palpable abnormality; right thyroid lobe with dominant nodule, smooth, mobile with swallowing, nontender; symmetric on extension; no palpable anterior or posterior cervical lymphadenopathy; no supraclavicular masses; no tenderness CHEST: clear to auscultation bilaterally without rales, rhonchi, or wheezes CARDIAC: regular rate and rhythm without significant murmur; peripheral pulses are full EXT:  non-tender without edema; no deformity NEURO: no gross focal deficits; no sign of tremor   IMPRESSION: #1 right thyroid nodules, 2.6 cm and 0.8 cm respectively, clinically stable #2 pancreatic neoplasm of uncertain behavior  PLAN: Patient will have a TSH level drawn next week. He has not had this level checked 2012. It has always been normal.  We will obtain  the report of the MRI scan from 2013 for review. If necessary, we may arrange for a followup MRI scan if the patient is in agreement.  At this point the patient does not require surgical intervention. While there is a small risk of potential malignancy in the thyroid gland, the risk of operative intervention is greater given his cardiac condition and his age.  Patient will return in 1 year for physical exam. We will repeat his thyroid ultrasound prior to that office visit. We will also repeat his TSH level at that time.  Earnstine Regal, MD, [facility] Office: [phone]  Visit Diagnoses: 1. THYROID NODULE

## 2013-01-29 ENCOUNTER — Encounter (INDEPENDENT_AMBULATORY_CARE_PROVIDER_SITE_OTHER): Payer: Self-pay | Admitting: Surgery

## 2013-01-29 DIAGNOSIS — K862 Cyst of pancreas: Secondary | ICD-10-CM

## 2013-01-29 HISTORY — DX: Cyst of pancreas: K86.2

## 2013-02-02 ENCOUNTER — Ambulatory Visit (INDEPENDENT_AMBULATORY_CARE_PROVIDER_SITE_OTHER): Payer: Medicare Other | Admitting: *Deleted

## 2013-02-02 DIAGNOSIS — R7989 Other specified abnormal findings of blood chemistry: Secondary | ICD-10-CM

## 2013-02-02 DIAGNOSIS — I2510 Atherosclerotic heart disease of native coronary artery without angina pectoris: Secondary | ICD-10-CM

## 2013-02-02 DIAGNOSIS — R799 Abnormal finding of blood chemistry, unspecified: Secondary | ICD-10-CM

## 2013-02-02 DIAGNOSIS — E041 Nontoxic single thyroid nodule: Secondary | ICD-10-CM

## 2013-02-02 LAB — BASIC METABOLIC PANEL
BUN: 25 mg/dL — AB (ref 6–23)
CHLORIDE: 103 meq/L (ref 96–112)
CO2: 30 mEq/L (ref 19–32)
CREATININE: 1.7 mg/dL — AB (ref 0.4–1.5)
Calcium: 8.6 mg/dL (ref 8.4–10.5)
GFR: 42.06 mL/min — AB (ref >60.00)
Glucose, Bld: 82 mg/dL (ref 70–99)
Potassium: 4.4 mEq/L (ref 3.5–5.1)
Sodium: 140 mEq/L (ref 135–145)

## 2013-02-02 LAB — TSH: TSH: 1.57 u[IU]/mL (ref 0.35–5.50)

## 2013-02-06 ENCOUNTER — Telehealth: Payer: Self-pay | Admitting: Cardiology

## 2013-02-06 NOTE — Telephone Encounter (Signed)
Pt calling wanting lab results.  Advised that Dr. Aundra Dubin has not reviewed his labs and he would be called when reviewed.

## 2013-02-06 NOTE — Telephone Encounter (Signed)
New message     Need kidney lab results.  If pt is not there, pls leave a message on the phone.

## 2013-02-09 ENCOUNTER — Telehealth: Payer: Self-pay | Admitting: Cardiology

## 2013-02-09 ENCOUNTER — Other Ambulatory Visit: Payer: Self-pay | Admitting: *Deleted

## 2013-02-09 DIAGNOSIS — I5032 Chronic diastolic (congestive) heart failure: Secondary | ICD-10-CM

## 2013-02-09 DIAGNOSIS — N289 Disorder of kidney and ureter, unspecified: Secondary | ICD-10-CM

## 2013-02-09 DIAGNOSIS — I359 Nonrheumatic aortic valve disorder, unspecified: Secondary | ICD-10-CM

## 2013-02-09 DIAGNOSIS — I25810 Atherosclerosis of coronary artery bypass graft(s) without angina pectoris: Secondary | ICD-10-CM

## 2013-02-09 NOTE — Telephone Encounter (Signed)
New Prob   Calling in regards to a referral to see Dr. Justin Mend. Please call.

## 2013-02-09 NOTE — Telephone Encounter (Signed)
Spoke with patient about recent lab results 

## 2013-02-09 NOTE — Telephone Encounter (Signed)
New message   Patient concern about the last lab results. GFR. Please advise

## 2013-02-09 NOTE — Telephone Encounter (Signed)
Pt requesting referral to Dr Justin Mend for increase in creatinine. Pt states Dr Justin Mend will not schedule pt an appt since it has been more than 3 years since he has seen him without referral from Dr Aundra Dubin. Per Dr Maceo Pro to make referral to Dr Justin Mend.

## 2013-02-13 ENCOUNTER — Encounter: Payer: Self-pay | Admitting: Surgery

## 2013-02-16 ENCOUNTER — Ambulatory Visit: Payer: Medicare Other | Admitting: Family

## 2013-02-16 ENCOUNTER — Ambulatory Visit (INDEPENDENT_AMBULATORY_CARE_PROVIDER_SITE_OTHER): Payer: 59 | Admitting: Surgery

## 2013-02-16 ENCOUNTER — Ambulatory Visit (HOSPITAL_COMMUNITY)
Admission: RE | Admit: 2013-02-16 | Discharge: 2013-02-16 | Disposition: A | Discharge status: Home / Self Care | Payer: Medicare Other | Source: Ambulatory Visit | Attending: Surgery | Admitting: Surgery

## 2013-02-16 ENCOUNTER — Encounter: Payer: Self-pay | Admitting: Surgery

## 2013-02-16 ENCOUNTER — Other Ambulatory Visit (HOSPITAL_COMMUNITY): Payer: Medicare Other

## 2013-02-16 VITALS — BP 118/56 | HR 62 | Ht 70.0 in | Wt 196.7 lb

## 2013-02-16 DIAGNOSIS — I658 Occlusion and stenosis of other precerebral arteries: Secondary | ICD-10-CM | POA: Insufficient documentation

## 2013-02-16 DIAGNOSIS — Z48812 Encounter for surgical aftercare following surgery on the circulatory system: Secondary | ICD-10-CM

## 2013-02-16 DIAGNOSIS — I6529 Occlusion and stenosis of unspecified carotid artery: Secondary | ICD-10-CM | POA: Insufficient documentation

## 2013-02-16 NOTE — Progress Notes (Signed)
Patient name: Andrew [name] MRN: [id number] DOB: November 21, 1923 Sex: male     Chief Complaint  Patient presents with  . Re-evaluation    1 year f/u carotid stenosis    HISTORY OF PRESENT ILLNESS: Patient is back today for his yearly carotid surveillance.  He has remained asymptomatic from a carotid perspective since I last saw him.  Specifically, he denies numbness or weakness in either extremity.  He denies slurred speech.  He denies amaurosis fugax.  The patient has had back problems requiring multiple injections over the past year.  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  He has also been told that he has had a minor heart attack within the past year.  His medications have been adjusted and he is feeling better although he has noticed a decline in his renal function.  He is scheduled to hopefully see Dr. Justin Mend in the immediate future.  He also suffers from macular degeneration and has undergone laser treatment for blurred vision which significantly helped.  Past Medical History  Diagnosis Date  . HYPERCHOLESTEROLEMIA   . CAD, ARTERY BYPASS GRAFT   . CAROTID ARTERY STENOSIS 03/07/2010    80%  . AORTIC STENOSIS   . GASTROESOPHAGEAL REFLUX DISEASE   . Skin cancer   . History of colonic polyps     1999, 2004  . Allergic rhinitis   . Thyroid nodule 05/2010    Abnormal biopsy, 80 year old patient and his wife have made informed decision to not pursue surgical resection. Potential risk including cancer has been thoroughly discussed with the patient.  . S/P excision of acoustic neuroma   . History of prostate cancer 2002    s/p treatment with seeds / radiation  . Lumbar spinal stenosis 07/13/2009  . COPD, mild 12/22/2010  . Pancreatic cyst 01/29/2013    Noted on MRI scan from [facility] on 07/27/2011.  I reviewed report at patient request on 01-29-2013.  "Small cystic focus in the posterior pancreatic head.  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  Follow up MRI is advised. The main pancreatic duct remains normal in appearance."  Patient do  . CHF (congestive heart failure)     Past Surgical History  Procedure Laterality Date  . Coronary artery bypass graft    . Transperineal implatation of palladium    . Cholecystectomy    . Shoulder surgery    . Total knee arthroplasty    . Cataract extraction    . Tonsillectomy and adenoidectomy  1932  . Craniectomy for excision of acoustic neuroma  1985  . Cataract extraction  2010    History   Social History  . Marital Status: Married    Spouse Name: N/A    Number of Children: N/A  . Years of Education: N/A   Occupational History  . retired  At [employer]    and [employer] Topics  . Smoking status: Former Smoker -- 2.00 packs/day for 20 years    Types: Cigarettes    Quit date: 01/22/1961  . Smokeless tobacco: Never Used  . Alcohol Use: 4.2 oz/week    7 Shots of liquor per week     Comment: 1 drink nightly  . Drug Use: No  . Sexual Activity: Not on file   Other Topics Concern  . Not  on file   Social History Narrative  . No narrative on file    Family History  Problem Relation Age of Onset  . Emphysema Brother   . Cancer Brother     lung  . Alcohol abuse    . Arthritis    . Cancer    . Macular degeneration    . Heart disease Father   . Hypertension Father   . Heart attack Father   . Colon cancer Sister   . Cancer Sister     non-hodgkins  . Heart disease Brother   . Cancer Sister     colon  . Cancer Sister     skin  . Cancer Daughter     lung    Allergies as of 02/16/2013  . (No Known Allergies)    Current Outpatient Prescriptions on File Prior to Visit  Medication Sig Dispense Refill  . aspirin 325 MG tablet Take 325 mg by mouth daily.      . B Complex-C-Folic Acid (NEPHRO-VITE PO) Take 1 tablet by mouth daily.          . cholecalciferol (VITAMIN D) 400 UNITS TABS Take 400 Units by mouth.      . furosemide (LASIX) 40 MG tablet TAKE 1 TABLET BY MOUTH DAILY  30 tablet  3  . isosorbide mononitrate (IMDUR) 60 MG 24 hr tablet Take 1 tablet (60 mg total) by mouth daily.  90 tablet  3  . metoprolol succinate (TOPROL-XL) 25 MG 24 hr tablet Take 1 tablet (25 mg total) by mouth daily.  90 tablet  3  . Multiple Vitamins-Minerals (RA VISION-VITE PRESERVE PO) Take 2 capsules by mouth daily.        . nitroGLYCERIN (NITROLINGUAL) 0.4 MG/SPRAY spray Place 1 spray under the tongue every 5 (five) minutes as needed.  12 g  6  . omeprazole (PRILOSEC) 20 MG capsule Take 20 mg by mouth daily.        . potassium chloride SA (K-DUR,KLOR-CON) 20 MEQ tablet TAKE 1 TABLET BY MOUTH DAILY.  30 tablet  3  . pravastatin (PRAVACHOL) 40 MG tablet Take 1 tablet (40 mg total) by mouth daily.  90 tablet  3  . ranolazine (RANEXA) 500 MG 12 hr tablet Take 1 tablet (500 mg total) by mouth 2 (two) times daily.  60 tablet  4   No current facility-administered medications on file prior to visit.     REVIEW OF SYSTEMS: Please see history of present illness, otherwise no changes  PHYSICAL EXAMINATION:   Vital signs are BP 118/56  Pulse 62  Ht 5\' 10"  (1.778 m)  Wt 196 lb 11.2 oz (89.223 kg)  BMI 28.22 kg/m2  SpO2 97% General: The patient appears their stated age. HEENT:  No gross abnormalities Pulmonary:  Non labored breathing Abdomen: Soft and non-tender.  Aorta is not palpable Musculoskeletal: There are no major deformities. Neurologic: No focal weakness or paresthesias are detected, Skin: There are no ulcer or rashes noted. Psychiatric: The patient has normal affect. Cardiovascular: There is a regular rate and rhythm without significant murmur appreciated.  No carotid bruits   Diagnostic Studies Ordered and reviewed his carotid ultrasound today.  This continues to show 60-79% left carotid stenosis and 40-59% right carotid stenosis.   These are not significantly changed from his prior study one year ago  Assessment: Asymptomatic bilateral carotid stenosis, left greater than right Plan: The patient will continue with yearly ultrasound surveillance.  He'll contact me should he develop symptoms.  Eldridge Abrahams, M.D. [facility] Office: [phone] Pager:  [phone]

## 2013-02-16 NOTE — Addendum Note (Signed)
Addended by: Dorthula Rue L on: 02/16/2013 11:50 AM   Modules accepted: Orders

## 2013-02-18 ENCOUNTER — Other Ambulatory Visit: Payer: Self-pay | Admitting: *Deleted

## 2013-02-18 DIAGNOSIS — I6529 Occlusion and stenosis of unspecified carotid artery: Secondary | ICD-10-CM

## 2013-03-09 ENCOUNTER — Other Ambulatory Visit (INDEPENDENT_AMBULATORY_CARE_PROVIDER_SITE_OTHER): Payer: 59

## 2013-03-09 DIAGNOSIS — I359 Nonrheumatic aortic valve disorder, unspecified: Secondary | ICD-10-CM

## 2013-03-09 DIAGNOSIS — I5032 Chronic diastolic (congestive) heart failure: Secondary | ICD-10-CM

## 2013-03-09 DIAGNOSIS — I25810 Atherosclerosis of coronary artery bypass graft(s) without angina pectoris: Secondary | ICD-10-CM

## 2013-03-09 LAB — BASIC METABOLIC PANEL
BUN: 24 mg/dL — ABNORMAL HIGH (ref 6–23)
CALCIUM: 8.5 mg/dL (ref 8.4–10.5)
CO2: 28 mEq/L (ref 19–32)
CREATININE: 1.4 mg/dL (ref 0.4–1.5)
Chloride: 102 mEq/L (ref 96–112)
GFR: 52.12 mL/min — AB (ref >60.00)
Glucose, Bld: 96 mg/dL (ref 70–99)
Potassium: 3.9 mEq/L (ref 3.5–5.1)
SODIUM: 140 meq/L (ref 135–145)

## 2013-03-11 ENCOUNTER — Encounter: Payer: Self-pay | Admitting: Cardiology

## 2013-03-11 ENCOUNTER — Ambulatory Visit (INDEPENDENT_AMBULATORY_CARE_PROVIDER_SITE_OTHER): Payer: 59 | Admitting: Cardiology

## 2013-03-11 VITALS — BP 142/62 | HR 68 | Ht 70.0 in | Wt 195.0 lb

## 2013-03-11 DIAGNOSIS — R079 Chest pain, unspecified: Secondary | ICD-10-CM

## 2013-03-11 DIAGNOSIS — I6529 Occlusion and stenosis of unspecified carotid artery: Secondary | ICD-10-CM

## 2013-03-11 DIAGNOSIS — I25810 Atherosclerosis of coronary artery bypass graft(s) without angina pectoris: Secondary | ICD-10-CM

## 2013-03-11 DIAGNOSIS — I359 Nonrheumatic aortic valve disorder, unspecified: Secondary | ICD-10-CM

## 2013-03-11 DIAGNOSIS — I509 Heart failure, unspecified: Secondary | ICD-10-CM

## 2013-03-11 DIAGNOSIS — E7800 Pure hypercholesterolemia, unspecified: Secondary | ICD-10-CM

## 2013-03-11 DIAGNOSIS — I5032 Chronic diastolic (congestive) heart failure: Secondary | ICD-10-CM

## 2013-03-11 DIAGNOSIS — N182 Chronic kidney disease, stage 2 (mild): Secondary | ICD-10-CM

## 2013-03-11 MED ORDER — NITROGLYCERIN 0.4 MG/SPRAY TL SOLN: 1.0000 | Status: DC | PRN

## 2013-03-11 MED ORDER — ISOSORBIDE MONONITRATE ER 60 MG PO TB24: 60.0000 mg | ORAL_TABLET | Freq: Every day | ORAL | Status: DC

## 2013-03-11 MED ORDER — PRAVASTATIN SODIUM 40 MG PO TABS: 40.0000 mg | ORAL_TABLET | Freq: Every day | ORAL | Status: DC

## 2013-03-11 MED ORDER — METOPROLOL SUCCINATE ER 25 MG PO TB24: 25.0000 mg | ORAL_TABLET | Freq: Every day | ORAL | Status: DC

## 2013-03-11 NOTE — Progress Notes (Signed)
Patient ID: Andrew [name], male   DOB: 1923-06-02, 80 y.o.   MRN: [id number] PCP: Dr [name]  80 yo with history of CAD s/p CABG and moderate aortic stenosis returns for followup.   He has had a history of chronic stable angina for years.  He has chronically had chest tightness after walking fast for about 100 yards.  In 12/14, he had a Lexiscan Cardiolite which showed EF 42% with medium-sized scar in the lateral wall along with mild peri-infarct ischemia.  This was deemed a low risk study.  I started the patient on ranolazine, which seemed to help his angina.  At last appointment, he also reported increased exertional dyspnea, and I increased his Lasix to 40 qam/20 qpm.   Since starting Lasix, his wife thinks he has been breathing better.  He is still short of breath walking < 1 block.  He had a rise in his creatinine on Lasix which stabilized out without lowering the Lasix dose.  No orthopnea or PND.  When his creatinine rose, he stopped ranolazine due to concern that this could have affected his kidneys.  Since I last saw him, he has had 4-5 episodes of chest tightness, usually when he walks fast or carries a load after a large meal.  He says that he has cut back on his activity because if he walks fast, he will get chest pain.  Weight is down 2 lbs.    Labs (11/13): K 4.4, creatinine 1.3, LDL 66, HDL 53 Labs (7/14): BNP 590 Labs (8/14): K 4.4, creatinine 1.4, BNP 218, LDL 53, HDL 43 Labs (11/14): K 4.1, creatinine 1.3 Labs (1/15): Creatinine 1.7 Labs (2/15): K 3.9, creatinine 1.4  PMH: 1. CAD: s/p CABG in 1991 with LIMA-LAD, SVG-PDA, seq SVG to OM 1,2,3.  [name] complicated by cholesterol atheroemboli and AKI.  One limb of seq SVG-OM 1,2,3 known to be occluded.  Chronic stable angina.  Lexiscan Cardiolite (12/14) with EF 42%, inferolateral hypokinesis, medium-sized scar in the lateral wall with mild peri-infarct ischemia, overall low risk.  2. Carotid stenosis: 7/13 carotid dopplers 40-59% on  right, 60-79% on left.  Carotids (1/14) with 78-46% RICA, 96-29% LICA.  Carotids (5/28) with 41-32% LICA, 44-01% RICA.  3. GERD 4. Polyps 5. Thyroid nodule 6. S/p excision of acoustic neuroma 7. Prostate cancer treated with seed implantation in 2002 8. COPD: mild 9. Cholecystectomy 10. CKD 11. Aortic stenosis: Moderate.  Echo (2/14) with EF 50-55%, mild LVH, moderate AS with AVA 1.3 cm^2 and mean gradient 23 mmHg.  Echo (8/14) with EF 55-60%, moderate LVH, moderate AS with mean gradient 27 mmHg and AVA 1.03 cm^2, mild to moderate AI, mild MR.  12. Chronic diastolic CHF.  13. Macular degeneration 14. Mild to moderate OSA on sleep study. He is not on CPAP.   SH: Retired Chief Financial Officer, quit smoking 1963, married, lives in [facility].  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   Current Outpatient Prescriptions  Medication Sig Dispense Refill  . aspirin 325 MG tablet Take 325 mg by mouth daily.      . B Complex-C-Folic Acid (NEPHRO-VITE PO) Take 1 tablet by mouth daily.        . cholecalciferol (VITAMIN D) 400 UNITS TABS Take 400 Units by mouth.      . furosemide (LASIX) 40 MG tablet TAKE 1 TABLET BY MOUTH DAILY  30 tablet  3  . isosorbide mononitrate (IMDUR) 60 MG 24 hr tablet Take  1 tablet (60 mg total) by mouth daily.  90 tablet  3  . metoprolol succinate (TOPROL-XL) 25 MG 24 hr tablet Take 1 tablet (25 mg total) by mouth daily.  90 tablet  3  . Multiple Vitamins-Minerals (RA VISION-VITE PRESERVE PO) Take 2 capsules by mouth daily.        . nitroGLYCERIN (NITROLINGUAL) 0.4 MG/SPRAY spray Place 1 spray under the tongue every 5 (five) minutes as needed.  1 g  12  . omeprazole (PRILOSEC) 20 MG capsule Take 20 mg by mouth daily.        . potassium chloride SA (K-DUR,KLOR-CON) 20 MEQ tablet TAKE 1 TABLET BY MOUTH DAILY.  30 tablet  3  . pravastatin (PRAVACHOL) 40 MG tablet Take 1 tablet (40 mg total) by mouth daily.  90 tablet  3   No current facility-administered  medications for this visit.    BP 142/62  Pulse 68  Ht 5\' 10"  (1.778 m)  Wt 88.451 kg (195 lb)  BMI 27.98 kg/m2 General: NAD Neck: JVP 7-8 cm, no thyromegaly or thyroid nodule.  Lungs: Slight crackles at the bases bilaterally.  CV: Nondisplaced PMI.  Heart regular S1/S2, no S3/S4, 3/6 SEM RUSB with clear S2.  Trace ankle edema.  Right carotid bruit.  Normal pedal pulses.  Abdomen: Soft, nontender, no hepatosplenomegaly, no distention.  Skin: Intact without lesions or rashes.  Neurologic: Alert and oriented x 3.  Psych: Normal affect. Extremities: No clubbing or cyanosis.   Assessment/Plan: 1. CAD: s/p CABG with long history of chronic stable angina.  He had a very difficult time with last cardiac cath (cholesterol atheroemboli and AKI) and wants cath only as last resort.  Lexiscan Cardiolite in 12/14 showed lateral MI with mild peri-infarct ischemia consistent with known occluded limb of sequential SVG-OMs (low risk).  He stopped ranolazine due to concern that it was affecting his renal function and has had a number of anginal episodes.   - I do not think that the ranolazine affected his renal function, it was likely Lasix.  He will restart ranolazine 500 mg bid. Continue Toprol XL and Imdur.  - Continue ASA and statin.  2. Chronic diastolic CHF: EF normal on echo recently though read as 42% on Cardiolite.  Volume looks better on higher Lasix dose and creatinine has come back to baseline.  - Continue Lasix 40 qam, 20 qpm.   - BMET/BNP in 1 month.  3. Hyperlipidemia: Good lipids.  Continue statin.  4. CKD: Creatinine back to 1.4 which appears to be his baseline.  I think the rise was related to increasing Lasix, but it has come back down.  5. Aortic stenosis: Moderate, repeat echo in 8/15.   6. Carotid stenosis: Followed by [facility].  7. OSA: Mild to moderate OSA on sleep study.  Not on CPAP at this point.    [name] 03/11/2013

## 2013-03-11 NOTE — Patient Instructions (Signed)
Take ranexa 500mg  two times a day.  Your physician recommends that you return for lab work in: 1 month--BMET.  Your physician recommends that you schedule a follow-up appointment in: 3 months with Dr Aundra Dubin.

## 2013-03-31 ENCOUNTER — Other Ambulatory Visit: Payer: Self-pay | Admitting: Dermatology

## 2013-04-08 ENCOUNTER — Other Ambulatory Visit (INDEPENDENT_AMBULATORY_CARE_PROVIDER_SITE_OTHER): Payer: 59

## 2013-04-08 DIAGNOSIS — I5032 Chronic diastolic (congestive) heart failure: Secondary | ICD-10-CM

## 2013-04-08 DIAGNOSIS — R079 Chest pain, unspecified: Secondary | ICD-10-CM

## 2013-04-08 DIAGNOSIS — I509 Heart failure, unspecified: Secondary | ICD-10-CM

## 2013-04-08 LAB — BASIC METABOLIC PANEL
BUN: 27 mg/dL — ABNORMAL HIGH (ref 6–23)
CO2: 31 meq/L (ref 19–32)
Calcium: 8.9 mg/dL (ref 8.4–10.5)
Chloride: 102 mEq/L (ref 96–112)
Creatinine, Ser: 1.6 mg/dL — ABNORMAL HIGH (ref 0.4–1.5)
GFR: 44.53 mL/min — AB (ref >60.00)
Glucose, Bld: 75 mg/dL (ref 70–99)
Potassium: 4.1 mEq/L (ref 3.5–5.1)
SODIUM: 137 meq/L (ref 135–145)

## 2013-04-17 ENCOUNTER — Other Ambulatory Visit: Payer: Self-pay | Admitting: Cardiology

## 2013-05-30 ENCOUNTER — Ambulatory Visit: Payer: Medicare Other

## 2013-05-30 ENCOUNTER — Ambulatory Visit (INDEPENDENT_AMBULATORY_CARE_PROVIDER_SITE_OTHER): Payer: Medicare Other | Admitting: Internal Medicine

## 2013-05-30 VITALS — BP 118/64 | HR 66 | Temp 98.9°F | Resp 17 | Ht 68.0 in | Wt 194.0 lb

## 2013-05-30 DIAGNOSIS — J45909 Unspecified asthma, uncomplicated: Secondary | ICD-10-CM

## 2013-05-30 DIAGNOSIS — R5383 Other fatigue: Secondary | ICD-10-CM

## 2013-05-30 DIAGNOSIS — R059 Cough, unspecified: Secondary | ICD-10-CM

## 2013-05-30 DIAGNOSIS — R05 Cough: Secondary | ICD-10-CM

## 2013-05-30 DIAGNOSIS — Z87891 Personal history of nicotine dependence: Secondary | ICD-10-CM

## 2013-05-30 DIAGNOSIS — J9801 Acute bronchospasm: Secondary | ICD-10-CM

## 2013-05-30 DIAGNOSIS — R5381 Other malaise: Secondary | ICD-10-CM

## 2013-05-30 LAB — POCT CBC
Granulocyte percent: 67.4 %G (ref 37–80)
HEMATOCRIT: 47.5 % (ref 43.5–53.7)
Hemoglobin: 15.2 g/dL (ref 14.1–18.1)
LYMPH, POC: 1.4 (ref 0.6–3.4)
MCH, POC: 34.1 pg — AB (ref 27–31.2)
MCHC: 32 g/dL (ref 31.8–35.4)
MCV: 106.4 fL — AB (ref 80–97)
MID (cbc): 0.6 (ref 0–0.9)
MPV: 11.1 fL (ref 0–99.8)
POC Granulocyte: 4.1 (ref 2–6.9)
POC LYMPH PERCENT: 22.7 %L (ref 10–50)
POC MID %: 9.9 %M (ref 0–12)
Platelet Count, POC: 128 10*3/uL — AB (ref 142–424)
RBC: 4.46 M/uL — AB (ref 4.69–6.13)
RDW, POC: 13.3 %
WBC: 6.1 10*3/uL (ref 4.6–10.2)

## 2013-05-30 MED ORDER — IPRATROPIUM BROMIDE 0.02 % IN SOLN
0.5000 mg | Freq: Once | RESPIRATORY_TRACT | Status: AC
  Administered 2013-05-30: 0.5 mg via RESPIRATORY_TRACT

## 2013-05-30 MED ORDER — ALBUTEROL SULFATE (2.5 MG/3ML) 0.083% IN NEBU
2.5000 mg | INHALATION_SOLUTION | Freq: Once | RESPIRATORY_TRACT | Status: AC
  Administered 2013-05-30: 2.5 mg via RESPIRATORY_TRACT

## 2013-05-30 MED ORDER — HYDROCODONE-ACETAMINOPHEN 7.5-325 MG/15ML PO SOLN: 5.0000 mL | Freq: Four times a day (QID) | ORAL | Status: DC | PRN

## 2013-05-30 MED ORDER — ALBUTEROL SULFATE HFA 108 (90 BASE) MCG/ACT IN AERS: 2.0000 | INHALATION_SPRAY | Freq: Four times a day (QID) | RESPIRATORY_TRACT | Status: DC | PRN

## 2013-05-30 MED ORDER — AZITHROMYCIN 500 MG PO TABS: 500.0000 mg | ORAL_TABLET | Freq: Every day | ORAL | Status: DC

## 2013-05-30 NOTE — Patient Instructions (Signed)
Chronic Asthmatic Bronchitis °Chronic asthmatic bronchitis is a complication of persistent asthma. After a period of time with asthma, some people develop airflow obstruction that is present all the time, even when not having an asthma attack. There is also persistent inflammation of the airways, and the bronchial tubes produce more mucus. Chronic asthmatic bronchitis usually is a permanent problem with the lungs. °CAUSES  °Chronic asthmatic bronchitis happens most often in people who have asthma and also smoke cigarettes. Occasionally, it can happen to a person with long-standing or severe asthma even if the person is not a smoker. °SIGNS AND SYMPTOMS  °Chronic asthmatic bronchitis usually causes symptoms of both asthma and chronic bronchitis, including:  °· Coughing. °· Increased sputum production. °· Wheezing and shortness of breath. °· Chest discomfort. °· Recurring infections. °DIAGNOSIS  °Your health care provider will take a medical history and perform a physical exam. Chronic asthmatic bronchitis is suspected when a person with asthma has abnormal results on breathing tests (pulmonary function tests) even when breathing symptoms are at their best. Other tests, such as a chest X-ray, may be performed to rule out other conditions.  °TREATMENT  °Treatment involves controlling symptoms with medicine and lifestyle changes. °· Your health care provider may prescribe asthma medicines, including inhaler and nebulizer medicines. °· Infection can be treated with medicine to kill germs (antibiotics). Serious infections may require hospitalization. These can include: °· Pneumonia. °· Sinus infections. °· Acute bronchitis.   °· Preventing infection and hospitalization is very important. Get an influenza vaccination every year as directed by your health care provider. Ask your health care provider whether you need a pneumonia vaccine. °· Ask your health care provider whether you would benefit from a pulmonary  rehabilitation program. °HOME CARE INSTRUCTIONS °· Only take over-the-counter or prescription medicine as directed by your health care provider. °· If you are a cigarette smoker, the most important thing that you can do is quit. Talk to your health care provider for help with quitting smoking. °· Avoid pollen, dust, animal dander, molds, smoke, and other things that cause attacks. °· Regular exercise is very important to help you feel better. Discuss possible exercise routines with your health care provider. °· If animal dander is the cause of asthma, you may not be able to keep pets. °· It is important that you: °· Become educated about your medical condition. °· Participate in maintaining wellness. °· Seek medical care as directed. Delay in seeking medical care could cause permanent injury and may be a risk to your life. °SEEK MEDICAL CARE IF: °· You have wheezing and shortness of breath even if taking medicine to prevent attacks. °· You have muscle aches, chest pain, or thickening of sputum. °· Your sputum changes from clear or white to yellow, green, gray, or bloody. °SEEK IMMEDIATE MEDICAL CARE IF: °· Your usual medicines do not stop your wheezing. °· You have increased coughing or shortness of breath or both. °· You have increased difficulty breathing. °· You have any problems from the medicine you are taking, such as a rash, itching, swelling, or trouble breathing. °MAKE SURE YOU:  °· Understand these instructions. °· Will watch your condition. °· Will get help right away if you are not doing well or get worse. °Document Released: 10/26/2005 Document Revised: 10/29/2012 Document Reviewed: 08/07/2012 °[employer]® Patient Information ©2014 [employer]. ° °

## 2013-05-30 NOTE — Progress Notes (Signed)
   Subjective:    Patient ID: Andrew [name], male    DOB: 18-Apr-1923, 80 y.o.   MRN: [id number]  HPI 80 year old male complains of dry cough, eye discharge, nasal drainage, scratchy throat, wheezing for the past 4-5 days. Former smoker quit in 1963. No fever but feelin fatigue Worried about early pneumonia, bronchitis. Usually does not have allergys. Has wheezing and sneezing last few days. Not sob, no cp.  Hx of recent extensive cardiovascular therapys with Dr. Marigene Ehlers.    Review of Systems See problem list    Objective:   Physical Exam  Vitals reviewed. Constitutional: He is oriented to person, place, and time. He appears well-developed and well-nourished.  HENT:  Head: Normocephalic.  Right Ear: External ear normal.  Left Ear: External ear normal.  Nose: Mucosal edema, rhinorrhea and sinus tenderness present. Right sinus exhibits maxillary sinus tenderness and frontal sinus tenderness. Left sinus exhibits maxillary sinus tenderness and frontal sinus tenderness.  Mouth/Throat: Oropharynx is clear and moist.  Eyes: EOM are normal.  Neck: Normal range of motion. Neck supple.  Cardiovascular: Normal rate and normal heart sounds.   Pulmonary/Chest: Effort normal. Not tachypneic. No respiratory distress. He has decreased breath sounds. He has wheezes. He has rhonchi. He has no rales.  Neurological: He is alert and oriented to person, place, and time. He exhibits normal muscle tone. Coordination normal.  Psychiatric: He has a normal mood and affect. His behavior is normal. Thought content normal.   PFR 430 l/min Nebulizer Post pfr 410  [facility] reading (PRIMARY) by  Dr.Klay Sobotka. No infiltrate  Results for orders placed in visit on 05/30/13  POCT CBC      Result Value Ref Range   WBC 6.1  4.6 - 10.2 K/uL   Lymph, poc 1.4  0.6 - 3.4   POC LYMPH PERCENT 22.7  10 - 50 %L   MID (cbc) 0.6  0 - 0.9   POC MID % 9.9  0 - 12 %M   POC Granulocyte 4.1  2 - 6.9   Granulocyte percent 67.4  37 -  80 %G   RBC 4.46 (*) 4.69 - 6.13 M/uL   Hemoglobin 15.2  14.1 - 18.1 g/dL   HCT, POC 47.5  43.5 - 53.7 %   MCV 106.4 (*) 80 - 97 fL   MCH, POC 34.1 (*) 27 - 31.2 pg   MCHC 32.0  31.8 - 35.4 g/dL   RDW, POC 13.3     Platelet Count, POC 128 (*) 142 - 424 K/uL   MPV 11.1  0 - 99.8 fL     thrombocytopenia     Assessment & Plan:  Asthmatic bronchitis Fatigue Zithromax/albuterol/lortab elixir

## 2013-06-03 ENCOUNTER — Ambulatory Visit (INDEPENDENT_AMBULATORY_CARE_PROVIDER_SITE_OTHER): Payer: 59 | Admitting: Family Medicine

## 2013-06-03 ENCOUNTER — Encounter: Payer: Self-pay | Admitting: Family Medicine

## 2013-06-03 VITALS — BP 110/60 | HR 63 | Temp 98.8°F | Ht 68.0 in

## 2013-06-03 DIAGNOSIS — J441 Chronic obstructive pulmonary disease with (acute) exacerbation: Secondary | ICD-10-CM

## 2013-06-03 DIAGNOSIS — G459 Transient cerebral ischemic attack, unspecified: Secondary | ICD-10-CM

## 2013-06-03 DIAGNOSIS — H539 Unspecified visual disturbance: Secondary | ICD-10-CM

## 2013-06-03 NOTE — Progress Notes (Signed)
   Subjective:    Patient ID: Andrew [name], male    DOB: 31-Jul-1923, 80 y.o.   MRN: [id number]  HPI 80 year old  male pt of Dr, Copland's with history of CAD, COPD, aortic stenosis,  and diastolic CHF presents for follow up bronchitis. He has been feeling ill in last several weeks with congestion and sneeze from allergies. Last week he started with cough and chest congestion, SOB. No fever.  Went to [facility] on 05/30/2013. Dx with bronchial asthma.  CXR clear, cbc showed low platelets ( he has low platelet in 2012 in past as well).  Started on azithromycin today ins last dose.  started codein cough med, flonase and proair.  Wheezing improved, but still presents cough improved.50% better. Very fatigued. Cough productive thick initially, some better.  No fever.    Nuclear stress test in last 6 months:  Showed mild MI, meds changed by Dr. Aundra Dubin.  Former heavy smoker.   Also in parking lot today  had episode where he couldn't focus on something close up. Blurred vision. No vertigo. Dizzy.   Symptoms resolved after 30 min.  HX of macular deg. In eyes No weakness, no slurred speech, no facial droop. No confusion.   Pt has B carotid stenosis, stable in 01/2013 last ECHO nml EF, moderate AS in 08/2012 Review of Systems  Constitutional: Negative for fever.  HENT: Negative for ear pain.   Eyes: Negative for pain.  Respiratory: Positive for shortness of breath and wheezing.   Cardiovascular: Negative for chest pain.  Gastrointestinal: Negative for abdominal pain.       Objective:   Physical Exam  Constitutional: He is oriented to person, place, and time. Vital signs are normal. He appears well-developed and well-nourished.  HENT:  Head: Normocephalic.  Right Ear: Hearing normal.  Left Ear: Hearing normal.  Nose: Nose normal.  Mouth/Throat: Oropharynx is clear and moist and mucous membranes are normal.  Neck: Trachea normal. Carotid bruit is not present. No mass and no  thyromegaly present.  Cardiovascular: Normal rate, regular rhythm and normal pulses.  Exam reveals no gallop, no distant heart sounds and no friction rub.   No murmur heard. No peripheral edema  Pulmonary/Chest: Effort normal. No respiratory distress. He has wheezes.  occ scattered wheeze  Neurological: He is alert and oriented to person, place, and time. He has normal strength. No cranial nerve deficit or sensory deficit. He displays a negative Romberg sign. Coordination and gait normal. GCS eye subscore is 4. GCS verbal subscore is 5. GCS motor subscore is 6.  Nml cerebellar exam  Skin: Skin is warm, dry and intact. No rash noted.  Psychiatric: He has a normal mood and affect. His speech is normal and behavior is normal. Thought content normal.          Assessment & Plan:

## 2013-06-03 NOTE — Patient Instructions (Addendum)
Start prednisone taper. Continue cough suppressant, albuterol prn. Call if not continuing to improve, or new fever, or increasing shortness of breath.  Continue aspirin daily Follow up with PCP next week.

## 2013-06-03 NOTE — Progress Notes (Signed)
Pre visit review using our clinic review tool, if applicable. No additional management support is needed unless otherwise documented below in the visit note. 

## 2013-06-10 ENCOUNTER — Ambulatory Visit (INDEPENDENT_AMBULATORY_CARE_PROVIDER_SITE_OTHER): Payer: 59 | Admitting: Family Medicine

## 2013-06-10 ENCOUNTER — Encounter: Payer: Self-pay | Admitting: Family Medicine

## 2013-06-10 VITALS — BP 108/60 | HR 62 | Temp 98.3°F | Ht 68.0 in | Wt 193.0 lb

## 2013-06-10 DIAGNOSIS — G459 Transient cerebral ischemic attack, unspecified: Secondary | ICD-10-CM

## 2013-06-10 DIAGNOSIS — F329 Major depressive disorder, single episode, unspecified: Secondary | ICD-10-CM

## 2013-06-10 DIAGNOSIS — J449 Chronic obstructive pulmonary disease, unspecified: Secondary | ICD-10-CM

## 2013-06-10 DIAGNOSIS — I359 Nonrheumatic aortic valve disorder, unspecified: Secondary | ICD-10-CM

## 2013-06-10 DIAGNOSIS — I25810 Atherosclerosis of coronary artery bypass graft(s) without angina pectoris: Secondary | ICD-10-CM

## 2013-06-10 DIAGNOSIS — I6529 Occlusion and stenosis of unspecified carotid artery: Secondary | ICD-10-CM

## 2013-06-10 MED ORDER — FLUOXETINE HCL 10 MG PO TABS: 10.0000 mg | ORAL_TABLET | Freq: Every day | ORAL | Status: DC

## 2013-06-10 NOTE — Progress Notes (Signed)
[place] [address] Phone: [phone] Fax: [phone]  Patient ID: KENDRE JACINTO MRN: [id number], DOB: [birth year], 80 y.o. Date of Encounter: 06/10/2013  Primary Physician:  [name], MD   Chief Complaint: Follow-up   Subjective:   History of Present Illness:  Andrew [name] is a 80 y.o. very pleasant male patient who presents with the following:  Complex medical patient with chronic angina, coronary disease, status post bypass, chronic heart failure, chronic obstructive pulmonary disease, aortic stenosis, who is very pleasant and well known. He was seen one week ago by my partner, and at that point he was getting over a chronic obstructive pulmonary disease exacerbation and some bronchitis, where he had what sounds to be a transient ischemic attack with prompt recovery.  He does have known significant carotid vascular disease, noted heart failure, and he has good excellent followup, with recent echocardiograms as well as recent vascular studies of his neck. At this point he is completely resolved from any sort of neurological symptoms, and a result in less than a day.  Initially, approximately 2 weeks ago, he saw Dr. Elder Cyphers, he was given some beta agonists as well as some antibiotics. His pulmonary status is recovered.  Sitting in parking lot, things did not feel right, felt feint. Did not feel well and was very unstable walking in.  ? TIA. Having a lot of tiredness and feeling like does not want to do much of anything.   Also has macular degeneration and loss of hearing. He completely cannot see out of 1I, and with his other eyes having difficulty now. He also has a lot of trouble hearing.  Has been feeling down and depressed and a lot in the last month. He is having difficulty with the aging process, given that he is 89, and he is limited somewhat now, and throughout his life he has been a very active, productive, highly intelligent person.  Has only one  good eye.   Seeing Dr. Ernestina Patches, back problems.  Not having as much angina.    Past Medical History, Surgical History, Social History, Family History, Problem List, Medications, and Allergies have been reviewed and updated if relevant.  Review of Systems:  GEN: No acute illnesses, no fevers, chills. GI: No n/v/d, eating normally Pulm: No SOB, improved Interactive and getting along well at home.  Otherwise, ROS is as per the HPI.  Objective:   Physical Examination: BP 108/60  Pulse 62  Temp(Src) 98.3 F (36.8 C) (Oral)  Ht 5\' 8"  (1.727 m)  Wt 193 lb (87.544 kg)  BMI 29.35 kg/m2   GEN: WDWN, NAD, Non-toxic, A & O x 3 HEENT: Atraumatic, Normocephalic. Neck supple. No masses, No LAD. Ears and Nose: No external deformity. CV: RRR, 2/6 SEM. No JVD. No thrill. No extra heart sounds. PULM: CTA B, no wheezes, crackles, rhonchi. No retractions. No resp. distress. No accessory muscle use. EXTR: No c/c/e NEURO Normal gait.  PSYCH: Normally interactive. Conversant. Not depressed or anxious appearing.  Calm demeanor.   Laboratory and Imaging Data: Results for orders placed in visit on 05/30/13  POCT CBC      Result Value Ref Range   WBC 6.1  4.6 - 10.2 K/uL   Lymph, poc 1.4  0.6 - 3.4   POC LYMPH PERCENT 22.7  10 - 50 %L   MID (cbc) 0.6  0 - 0.9   POC MID % 9.9  0 - 12 %M   POC Granulocyte 4.1  2 - 6.9   Granulocyte percent 67.4  37 - 80 %G   RBC 4.46 (*) 4.69 - 6.13 M/uL   Hemoglobin 15.2  14.1 - 18.1 g/dL   HCT, POC 47.5  43.5 - 53.7 %   MCV 106.4 (*) 80 - 97 fL   MCH, POC 34.1 (*) 27 - 31.2 pg   MCHC 32.0  31.8 - 35.4 g/dL   RDW, POC 13.3     Platelet Count, POC 128 (*) 142 - 424 K/uL   MPV 11.1  0 - 99.8 fL    Dg Chest 2 View  05/30/2013   CLINICAL DATA:  Shortness of breath for 2-3 weeks  EXAM: CHEST  2 VIEW  COMPARISON:  DG CHEST 2 VIEW dated 08/18/2012; CT CHEST W/O CM dated 08/06/2011; DG CHEST 2V dated 06/02/2011  FINDINGS: Status post prior CABG. Heart size mildly  enlarged but stable. Vascular pattern normal. No consolidation or effusion.  IMPRESSION: No active cardiopulmonary disease.   Electronically Signed   By: Skipper Cliche M.D.   On: 05/30/2013 15:00    Assessment & Plan:   TIA (transient ischemic attack)  Major depressive disorder, single episode  COPD, mild  CAROTID ARTERY STENOSIS  CAD, ARTERY BYPASS GRAFT  AORTIC STENOSIS  I think he probably did have a transient ischemic attack. Long discussion with the patient and his wife. He has known carotid disease, with recent echocardiograms, as well as stress test and excellent followup with vascular surgery as well as cardiology. He is maximally treated from a medical standpoint for all his coronary issues. He is on full dose aspirin. At this point, I would not do anything further at all, maintain him on the current medications, but if he did start developing recurrent TIAs or had a frank stroke, additional workup would be a good idea. They have a very good understanding of potential complications with procedures and operations that may or may not be necessary.  His pulmonary status is improved.  He is also depressed, which is a new finding compared any other time I have dealt with him. He feels like he has been depressed for at least a month or more, and his wife also notices this as well. He is not suicidal. He thinks a lot of this has to do with him coming to terms with his age and body. Start Prozac with close followup.  Follow-up: Return in about 6 weeks (around 07/22/2013). Unless noted above, the patient is to follow-up if symptoms worsen. Red flags were reviewed with the patient.  New Prescriptions   FLUOXETINE (PROZAC) 10 MG TABLET    Take 1 tablet (10 mg total) by mouth daily.   No orders of the defined types were placed in this encounter.    Signed,  Maud Deed. Tiffiney Sparrow, MD, [place]   Patient's Medications  New Prescriptions   FLUOXETINE (PROZAC) 10 MG TABLET     Take 1 tablet (10 mg total) by mouth daily.  Previous Medications   ALBUTEROL (PROVENTIL HFA;VENTOLIN HFA) 108 (90 BASE) MCG/ACT INHALER    Inhale 2 puffs into the lungs every 6 (six) hours as needed for wheezing or shortness of breath.   ASPIRIN 325 MG TABLET    Take 325 mg by mouth daily.   B COMPLEX-C-FOLIC ACID (NEPHRO-VITE PO)    Take 1 tablet by mouth daily.     CHOLECALCIFEROL (VITAMIN D) 400 UNITS TABS    Take 400 Units by mouth.   FUROSEMIDE (LASIX) 40  MG TABLET    TAKE 1 TABLET BY MOUTH DAILY   ISOSORBIDE MONONITRATE (IMDUR) 60 MG 24 HR TABLET    Take 1 tablet (60 mg total) by mouth daily.   METOPROLOL SUCCINATE (TOPROL-XL) 25 MG 24 HR TABLET    Take 1 tablet (25 mg total) by mouth daily.   MULTIPLE VITAMINS-MINERALS (RA VISION-VITE PRESERVE PO)    Take 2 capsules by mouth daily.     NITROGLYCERIN (NITROLINGUAL) 0.4 MG/SPRAY SPRAY    Place 1 spray under the tongue every 5 (five) minutes as needed.   POTASSIUM CHLORIDE SA (K-DUR,KLOR-CON) 20 MEQ TABLET    TAKE 1 TABLET BY MOUTH DAILY.   PRAVASTATIN (PRAVACHOL) 40 MG TABLET    Take 1 tablet (40 mg total) by mouth daily.  Modified Medications   No medications on file  Discontinued Medications   AZITHROMYCIN (ZITHROMAX) 500 MG TABLET    Take 1 tablet (500 mg total) by mouth daily.   HYDROCODONE-ACETAMINOPHEN (HYCET) 7.5-325 MG/15 ML SOLUTION    Take 5 mLs by mouth every 6 (six) hours as needed (or cough).   OMEPRAZOLE (PRILOSEC) 20 MG CAPSULE    Take 20 mg by mouth daily.

## 2013-06-10 NOTE — Progress Notes (Signed)
Pre visit review using our clinic review tool, if applicable. No additional management support is needed unless otherwise documented below in the visit note. 

## 2013-06-11 DIAGNOSIS — F329 Major depressive disorder, single episode, unspecified: Secondary | ICD-10-CM | POA: Insufficient documentation

## 2013-06-11 DIAGNOSIS — J441 Chronic obstructive pulmonary disease with (acute) exacerbation: Secondary | ICD-10-CM | POA: Insufficient documentation

## 2013-06-11 NOTE — Assessment & Plan Note (Signed)
Infection likely treated.  givne continue respiratory symptoms,Start prednisone taper. Continue cough suppressant, albuterol prn. Call if not continuing to improve, or new fever, or increasing shortness of breath.

## 2013-06-11 NOTE — Assessment & Plan Note (Signed)
Very likely a TIA event given his carotid stenosis , CAD and likely extensive plaque throughout vessels. No other clear cause. Discussed how MRI likely would be unhelpful in setting of TIA.  Pt has had recent ECHO and carotid dopplers and is followed by vascular.  He is already on 325 mg daily of Aspirin. Discussed starting aggrenox, but he has chose to hold off and discuss further in follow up with PCP who knows his history better. I think this is appropriate.

## 2013-06-12 ENCOUNTER — Encounter: Payer: Self-pay | Admitting: Cardiology

## 2013-06-12 ENCOUNTER — Encounter: Payer: Self-pay | Admitting: *Deleted

## 2013-06-12 ENCOUNTER — Ambulatory Visit (INDEPENDENT_AMBULATORY_CARE_PROVIDER_SITE_OTHER): Payer: 59 | Admitting: Cardiology

## 2013-06-12 VITALS — BP 140/78 | HR 64 | Ht 68.0 in | Wt 194.1 lb

## 2013-06-12 DIAGNOSIS — I509 Heart failure, unspecified: Secondary | ICD-10-CM

## 2013-06-12 DIAGNOSIS — I5032 Chronic diastolic (congestive) heart failure: Secondary | ICD-10-CM

## 2013-06-12 DIAGNOSIS — I359 Nonrheumatic aortic valve disorder, unspecified: Secondary | ICD-10-CM

## 2013-06-12 DIAGNOSIS — I25810 Atherosclerosis of coronary artery bypass graft(s) without angina pectoris: Secondary | ICD-10-CM

## 2013-06-12 DIAGNOSIS — I6529 Occlusion and stenosis of unspecified carotid artery: Secondary | ICD-10-CM

## 2013-06-12 DIAGNOSIS — I209 Angina pectoris, unspecified: Secondary | ICD-10-CM

## 2013-06-12 DIAGNOSIS — I350 Nonrheumatic aortic (valve) stenosis: Secondary | ICD-10-CM

## 2013-06-12 DIAGNOSIS — E7800 Pure hypercholesterolemia, unspecified: Secondary | ICD-10-CM

## 2013-06-12 LAB — LIPID PANEL
CHOLESTEROL: 119 mg/dL (ref 0–200)
HDL: 45 mg/dL (ref >39.00)
LDL CALC: 50 mg/dL (ref 0–99)
Total CHOL/HDL Ratio: 3
Triglycerides: 122 mg/dL (ref 0.0–149.0)
VLDL: 24.4 mg/dL (ref 0.0–40.0)

## 2013-06-12 LAB — BASIC METABOLIC PANEL
BUN: 27 mg/dL — AB (ref 6–23)
CALCIUM: 8.9 mg/dL (ref 8.4–10.5)
CO2: 31 mEq/L (ref 19–32)
Chloride: 103 mEq/L (ref 96–112)
Creatinine, Ser: 1.3 mg/dL (ref 0.4–1.5)
GFR: 57.9 mL/min — AB (ref >60.00)
Glucose, Bld: 70 mg/dL (ref 70–99)
Potassium: 4.6 mEq/L (ref 3.5–5.1)
Sodium: 140 mEq/L (ref 135–145)

## 2013-06-12 MED ORDER — ISOSORBIDE MONONITRATE ER 60 MG PO TB24: ORAL_TABLET | ORAL | Status: DC

## 2013-06-12 NOTE — Patient Instructions (Addendum)
Increase Imdur (isosorbide) to 90mg  daily. This will be 1 and 1/2 of a 60mg  tablet daily.  You have been referred to Dr Junius Roads.  Your physician has requested that you have an echocardiogram. Echocardiography is a painless test that uses sound waves to create images of your heart. It provides your doctor with information about the size and shape of your heart and how well your heart's chambers and valves are working. This procedure takes approximately one hour. There are no restrictions for this procedure. August 2015  Your physician recommends that you schedule a follow-up appointment in: 4 months with Dr Aundra Dubin.    Your physician recommends that you have lab work today--BMET/Lipid profile

## 2013-06-13 NOTE — Progress Notes (Signed)
Patient ID: Andrew [name], male   DOB: 12-09-1923, 80 y.o.   MRN: [id number] PCP: Dr [name]  80 yo with history of CAD s/p CABG and moderate aortic stenosis returns for followup.   He has had a history of chronic stable angina for years.  He has chronically had chest tightness after walking fast for about 100 yards.  In 12/14, he had a Lexiscan Cardiolite which showed EF 42% with medium-sized scar in the lateral wall along with mild peri-infarct ischemia.  This was deemed a low risk study.  I started the patient on ranolazine, which seemed to help his angina.  I have also treated him for chronic diastolic CHF with Lasix.  Since I last saw him, he saw Dr. Justin Mend with nephrology and ranolazine was stopped.   A few weeks ago, patient had an episode of disequilibrium and difficulty standing.  This lasted for about 30 minutes and completely resolved.  Episode was concerning for TIA.  Weight is stable.  He denies significant dyspnea but is not very active due to low back pain.  Macular degeneration is making eyesight worse and he no longer drives. Now that he is off ranolazine, he gets mild chest pain/tightness about once a week carrying a load or walking a longer distance.  Often he will get chest pain when he walks after eating a large meal.    ECG: NSR with PVC, nonspecific inferior T wave flattening  Labs (11/13): K 4.4, creatinine 1.3, LDL 66, HDL 53 Labs (7/14): BNP 590 Labs (8/14): K 4.4, creatinine 1.4, BNP 218, LDL 53, HDL 43 Labs (11/14): K 4.1, creatinine 1.3 Labs (1/15): Creatinine 1.7 Labs (2/15): K 3.9, creatinine 1.4 Labs (3/15): K 4.1, creatinine 1.6  PMH: 1. CAD: s/p CABG in 1991 with LIMA-LAD, SVG-PDA, seq SVG to OM 1,2,3.  [place] [address] complicated by cholesterol atheroemboli and AKI.  One limb of seq SVG-OM 1,2,3 known to be occluded.  Chronic stable angina.  Lexiscan Cardiolite (12/14) with EF 42%, inferolateral hypokinesis, medium-sized scar in the lateral wall with mild peri-infarct  ischemia, overall low risk.  2. Carotid stenosis: 7/13 carotid dopplers 40-59% on right, 60-79% on left.  Carotids (1/14) with 40-34% RICA, 74-25% LICA.  Carotids (9/56) with 38-75% LICA, 64-33% RICA.  Possible TIA in 4/15.  3. GERD 4. Polyps 5. Thyroid nodule 6. S/p excision of acoustic neuroma 7. Prostate cancer treated with seed implantation in 2002 8. COPD: mild 9. Cholecystectomy 10. CKD 11. Aortic stenosis: Moderate.  Echo (2/14) with EF 50-55%, mild LVH, moderate AS with AVA 1.3 cm^2 and mean gradient 23 mmHg.  Echo (8/14) with EF 55-60%, moderate LVH, moderate AS with mean gradient 27 mmHg and AVA 1.03 cm^2, mild to moderate AI, mild MR.  12. Chronic diastolic CHF.  13. Macular degeneration 14. Mild to moderate OSA on sleep study. He is not on CPAP.  15. Depression 16. Migraines  SH: Retired Chief Financial Officer, quit smoking 1963, married, lives in [facility].  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   Current Outpatient Prescriptions  Medication Sig Dispense Refill  . albuterol (PROVENTIL HFA;VENTOLIN HFA) 108 (90 BASE) MCG/ACT inhaler Inhale 2 puffs into the lungs every 6 (six) hours as needed for wheezing or shortness of breath.  1 Inhaler  3  . aspirin 325 MG tablet Take 325 mg by mouth daily.      . B Complex-C-Folic Acid (NEPHRO-VITE PO) Take 1 tablet by mouth daily.        Marland Kitchen  cholecalciferol (VITAMIN D) 400 UNITS TABS Take 400 Units by mouth.      [name] 10 MG tablet Take 1 tablet (10 mg total) by mouth daily.  90 tablet  3  . furosemide (LASIX) 40 MG tablet TAKE 1 TABLET BY MOUTH DAILY  30 tablet  1  . metoprolol succinate (TOPROL-XL) 25 MG 24 hr tablet Take 1 tablet (25 mg total) by mouth daily.  90 tablet  3  . Multiple Vitamins-Minerals (RA VISION-VITE PRESERVE PO) Take 2 capsules by mouth daily.        . nitroGLYCERIN (NITROLINGUAL) 0.4 MG/SPRAY spray Place 1 spray under the tongue every 5 (five) minutes as needed.  1 g  12  .  potassium chloride SA (K-DUR,KLOR-CON) 20 MEQ tablet TAKE 1 TABLET BY MOUTH DAILY.  30 tablet  3  . pravastatin (PRAVACHOL) 40 MG tablet Take 1 tablet (40 mg total) by mouth daily.  90 tablet  3  . isosorbide mononitrate (IMDUR) 60 MG 24 hr tablet 1 and 1/2 tablets (90mg ) daily  45 tablet  6   No current facility-administered medications for this visit.    BP 140/78  Pulse 64  Ht 5\' 8"  (1.727 m)  Wt 194 lb 1.9 oz (88.052 kg)  BMI 29.52 kg/m2 General: NAD Neck: JVP 7 cm, no thyromegaly or thyroid nodule.  Lungs: Slight crackles at the bases bilaterally.  CV: Nondisplaced PMI.  Heart regular S1/S2, no S3/S4, 2/6 SEM RUSB with clear S2.  Trace ankle edema.  Right carotid bruit.  Normal pedal pulses.  Abdomen: Soft, nontender, no hepatosplenomegaly, no distention.  Skin: Intact without lesions or rashes.  Neurologic: Alert and oriented x 3.  Psych: Normal affect. Extremities: No clubbing or cyanosis.   Assessment/Plan: 1. CAD: s/p CABG with long history of chronic stable angina.  He had a very difficult time with last cardiac cath (cholesterol atheroemboli and AKI) and wants cath only as last resort.  Lexiscan Cardiolite in 12/14 showed lateral MI with mild peri-infarct ischemia consistent with known occluded limb of sequential SVG-OMs (low risk).  He is now off ranolazine (told to stop by nephrology).  He is having chest pain now about once a week. - Continue Toprol XL, increase Imdur to 90 mg daily to see if this will help with chest pain.  - Continue ASA and statin.  2. Chronic diastolic CHF: EF normal on echo recently though read as 42% on last Cardiolite.  Patient looks euvolemic on current Lasix dose. - Continue Lasix 40 mg daily.   - BMET today.  3. Hyperlipidemia: Check lipids today.  Continue statin.  4. CKD: Last creatinine 1.6.  Repeating today.   5. Aortic stenosis: Moderate, repeat echo in 8/15.   6. Carotid stenosis: Followed by [facility].  I am concerned that the episode he had  in 4/15 with disequilibrium and difficulty standing my have been a TIA.  He had 83-15% LICA stenosis when carotid dopplers were last done.  Continue ASA, I will have him followup soon with [facility] for re-evaluation.  7. OSA: Mild to moderate OSA on sleep study.  Not on CPAP at this point.    Larey Dresser 06/13/2013

## 2013-06-19 ENCOUNTER — Encounter: Payer: Self-pay | Admitting: Surgery

## 2013-06-22 ENCOUNTER — Ambulatory Visit (INDEPENDENT_AMBULATORY_CARE_PROVIDER_SITE_OTHER): Payer: Medicare Other | Admitting: Surgery

## 2013-06-22 ENCOUNTER — Other Ambulatory Visit: Payer: Self-pay | Admitting: Surgery

## 2013-06-22 ENCOUNTER — Encounter: Payer: Self-pay | Admitting: Surgery

## 2013-06-22 ENCOUNTER — Ambulatory Visit (HOSPITAL_COMMUNITY)
Admission: RE | Admit: 2013-06-22 | Discharge: 2013-06-22 | Disposition: A | Discharge status: Home / Self Care | Payer: Medicare Other | Source: Ambulatory Visit | Attending: Surgery | Admitting: Surgery

## 2013-06-22 VITALS — BP 139/64 | HR 49 | Resp 14 | Ht 68.5 in | Wt 192.0 lb

## 2013-06-22 DIAGNOSIS — I6529 Occlusion and stenosis of unspecified carotid artery: Secondary | ICD-10-CM

## 2013-06-22 NOTE — Progress Notes (Signed)
Patient name: Andrew [name] MRN: [id number] DOB: 09-13-1923 Sex: male     Chief Complaint  Patient presents with  . New Evaluation    C/O  Ref. by Dr. Aundra Dubin   ?  TIA  3 wks ago.    HISTORY OF PRESENT ILLNESS: The patient comes in today for an unscheduled followup.  I have seen him yearly for carotid artery occlusive disease which has remained asymptomatic.  His last imaging study was in January of 2015.  Approximately several weeks ago, the patient did have a episode where he describes having wavy vision, where he could not focus his eyes.  He describes it as seen cross side.  He also had trouble with standing and being stable.  He was able to walk without falling.  His symptoms resolved after about 20 minutes.  At the time of his symptoms he describes having a bad cold associated with wheezing and coughing as well as sneezing.  He thought that this might be allergies.  He does not report any localizing symptoms.  This has been his only episode.  Past Medical History  Diagnosis Date  . HYPERCHOLESTEROLEMIA   . CAD, ARTERY BYPASS GRAFT   . CAROTID ARTERY STENOSIS 03/07/2010    80%  . AORTIC STENOSIS   . GASTROESOPHAGEAL REFLUX DISEASE   . Skin cancer   . History of colonic polyps     1999, 2004  . Allergic rhinitis   . Thyroid nodule 05/2010    Abnormal biopsy, 80 year old patient and his wife have made informed decision to not pursue surgical resection. Potential risk including cancer has been thoroughly discussed with the patient.  . S/P excision of acoustic neuroma   . History of prostate cancer 2002    s/p treatment with seeds / radiation  . Lumbar spinal stenosis 07/13/2009  . COPD, mild 12/22/2010  . Pancreatic cyst 01/29/2013    Noted on MRI scan [facility] on 07/27/2011.  I reviewed report at patient request on 01-29-2013.  "Small cystic focus in the posterior pancreatic head.  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  Follow up MRI is advised. The main pancreatic duct remains normal in appearance."  Patient do  . CHF (congestive heart failure)     Past Surgical History  Procedure Laterality Date  . Coronary artery bypass graft    . Transperineal implatation of palladium    . Cholecystectomy    . Shoulder surgery    . Total knee arthroplasty    . Cataract extraction    . Tonsillectomy and adenoidectomy  1932  . Craniectomy for excision of acoustic neuroma  1985  . Cataract extraction  2010    History   Social History  . Marital Status: Married    Spouse Name: N/A    Number of Children: N/A  . Years of Education: N/A   Occupational History  . retired  At [employer]    and [place] [employer]  . Smoking status: Former Smoker -- 2.00 packs/day for 20 years    Types: Cigarettes    Quit date: 01/22/1961  . Smokeless tobacco: Never Used  . Alcohol Use: 4.2 oz/week    7 Shots of liquor per week     Comment: 1 drink nightly  . Drug Use: No  . Sexual Activity: Not on file   Other  Topics Concern  . Not on file   Social History Narrative  . No narrative on file    Family History  Problem Relation Age of Onset  . Emphysema Brother   . Cancer Brother     lung  . Alcohol abuse    . Arthritis    . Cancer    . Macular degeneration    . Heart disease Father   . Hypertension Father   . Heart attack Father   . Colon cancer Sister   . Cancer Sister     non-hodgkins  . Heart disease Brother   . Cancer Sister     colon  . Cancer Sister     skin  . Cancer Daughter     lung    Allergies as of 06/22/2013  . (No Known Allergies)    Current Outpatient Prescriptions on File Prior to Visit  Medication Sig Dispense Refill  . albuterol (PROVENTIL HFA;VENTOLIN HFA) 108 (90 BASE) MCG/ACT inhaler Inhale 2 puffs into the lungs every 6 (six) hours as needed for wheezing or shortness of breath.  1 Inhaler  3  .  aspirin 325 MG tablet Take 325 mg by mouth daily.      . B Complex-C-Folic Acid (NEPHRO-VITE PO) Take 1 tablet by mouth daily.        . cholecalciferol (VITAMIN D) 400 UNITS TABS Take 400 Units by mouth.      [name] 10 MG tablet Take 1 tablet (10 mg total) by mouth daily.  90 tablet  3  . furosemide (LASIX) 40 MG tablet TAKE 1 TABLET BY MOUTH DAILY  30 tablet  1  . isosorbide mononitrate (IMDUR) 60 MG 24 hr tablet 1 and 1/2 tablets (90mg ) daily  45 tablet  6  . metoprolol succinate (TOPROL-XL) 25 MG 24 hr tablet Take 1 tablet (25 mg total) by mouth daily.  90 tablet  3  . Multiple Vitamins-Minerals (RA VISION-VITE PRESERVE PO) Take 2 capsules by mouth daily.        . nitroGLYCERIN (NITROLINGUAL) 0.4 MG/SPRAY spray Place 1 spray under the tongue every 5 (five) minutes as needed.  1 g  12  . potassium chloride SA (K-DUR,KLOR-CON) 20 MEQ tablet TAKE 1 TABLET BY MOUTH DAILY.  30 tablet  3  . pravastatin (PRAVACHOL) 40 MG tablet Take 1 tablet (40 mg total) by mouth daily.  90 tablet  3   No current facility-administered medications on file prior to visit.     REVIEW OF SYSTEMS: Please see history of present illness, otherwise all systems are unchanged from prior visit on 02/16/2013  PHYSICAL EXAMINATION:   Vital signs are BP 139/64  Pulse 49  Resp 14  Ht 5' 8.5" (1.74 m)  Wt 192 lb (87.091 kg)  BMI 28.77 kg/m2  SpO2 96% General: The patient appears their stated age. HEENT:  No gross abnormalities Pulmonary:  Non labored breathing Musculoskeletal: There are no major deformities. Neurologic: No focal weakness or paresthesias are detected, Skin: There are no ulcer or rashes noted. Psychiatric: The patient has normal affect. Cardiovascular: There is a regular rate and rhythm without significant murmur appreciated.  No carotid bruit   Diagnostic Studies Carotid ultrasound was repeated today.  I have reviewed this.  There been no significant changes.  This shows 40-59%  stenosis on the right and 60-the stenosis on the left  Assessment: Carotid stenosis Plan: There has been no significant change in the patient's carotid ultrasound.  His symptomatic episode did  not localize to either side of his body but was rather generalized.  It also occurred during a time where he was feeling ill.  Therefore, I cannot conclusively say that this was secondary to his carotid stenosis.  More than likely the episode was multifactorial in etiology.  Therefore, without definitive evidence that this was secondary to his carotid disease, I would recommend continued observation with ongoing medical therapy.  If the patient had another episode, I would consider carotid endarterectomy, however at this point I do not think it is warranted.  This was extensively discussed with the patient and his wife and they are in full agreement.  He'll followup with me in 6 months with a repeat ultrasound  V. Leia Alf, M.D. [facility] Office: [phone] Pager:  [phone]   b

## 2013-07-27 ENCOUNTER — Ambulatory Visit: Payer: 59 | Admitting: Family Medicine

## 2013-07-29 ENCOUNTER — Encounter: Payer: Self-pay | Admitting: Family Medicine

## 2013-07-29 ENCOUNTER — Ambulatory Visit (INDEPENDENT_AMBULATORY_CARE_PROVIDER_SITE_OTHER): Payer: Medicare Other | Admitting: Family Medicine

## 2013-07-29 VITALS — BP 120/70 | HR 56 | Temp 98.7°F | Ht 68.0 in | Wt 196.5 lb

## 2013-07-29 DIAGNOSIS — M79609 Pain in unspecified limb: Secondary | ICD-10-CM

## 2013-07-29 DIAGNOSIS — F6811 Factitious disorder with predominantly psychological signs and symptoms: Secondary | ICD-10-CM

## 2013-07-29 DIAGNOSIS — M79671 Pain in right foot: Secondary | ICD-10-CM

## 2013-07-29 DIAGNOSIS — R4189 Other symptoms and signs involving cognitive functions and awareness: Secondary | ICD-10-CM

## 2013-07-29 NOTE — Progress Notes (Signed)
Pre visit review using our clinic review tool, if applicable. No additional management support is needed unless otherwise documented below in the visit note. 

## 2013-07-29 NOTE — Progress Notes (Signed)
[place] [address] Phone: [phone] Fax: [phone]  Patient ID: Andrew [name] MRN: [id number], DOB: [birth year], 80 y.o. Date of Encounter: 07/29/2013  Primary Physician:  [name], MD   Chief Complaint: Follow-up   Subjective:   History of Present Illness:  Andrew [name] is a 80 y.o. very pleasant male patient who presents with the following:  Depression / pseudodementia? He is less irritable, and he seems less depressed. His wife thinks he is doing much better. His daughters both think that he is doing much better as well. He is now tolerating Prozac 10 mg.  Still has some wheezing at night. Uses a inhaler at night. Enough that will wake up at night, but not every night.   Skin: developing tape allergy.  Bruising.  10-15 years ago, trying to chase a cat and heard a pop on his ankle. Saw Benna Dunks back then.  R forefoot.    Past Medical History, Surgical History, Social History, Family History, Problem List, Medications, and Allergies have been reviewed and updated if relevant.  Review of Systems:  GEN: No acute illnesses, no fevers, chills. GI: No n/v/d, eating normally Pulm: No SOB Interactive and getting along well at home.  Otherwise, ROS is as per the HPI.  Objective:   Physical Examination: BP 120/70  Pulse 56  Temp(Src) 98.7 F (37.1 C) (Oral)  Ht 5\' 8"  (1.727 m)  Wt 196 lb 8 oz (89.132 kg)  BMI 29.88 kg/m2   GEN: WDWN, NAD, Non-toxic, Alert & Oriented x 3 HEENT: Atraumatic, Normocephalic.  Ears and Nose: No external deformity. EXTR: No clubbing/cyanosis/edema NEURO: Normal gait.  PSYCH: Normally interactive. Conversant. Not depressed or anxious appearing.  Calm demeanor.   He does have some tenderness directly at the 4th metatarsal.  Laboratory and Imaging Data:  Assessment & Plan:   Pseudodementia  Right foot pain - Plan: DG Foot Complete Right  From a depression in pseudodementia standpoint, he is doing  much better.  He still is having some RIGHT forefoot pain, he also had a number of other complaints today including questions about potential chronic obstructive pulmonary disease given his long-standing smoking history.  They have family in town, and they wanted to visit with them while there still here, so they're going to followup for reassessment of the patient's RIGHT foot. I think getting an x-ray of the forefoot would be appropriate, and we can do additional evaluation of his lungs including potentially spirometry while there here.  New Prescriptions   No medications on file   Modified Medications   No medications on file   Orders Placed This Encounter  Procedures  . DG Foot Complete Right   Follow-up: Return for f/u Appt, x-ray and lung study, 30 minutes. Unless noted above, the patient is to follow-up if symptoms worsen. Red flags were reviewed with the patient.  Signed,  Maud Deed. Markan Cazarez, MD, CAQ Sports Medicine   Discontinued Medications   No medications on file   Current Medications at Discharge:   Medication List       This list is accurate as of: 07/29/13 11:59 PM.  Always use your most recent med list.               albuterol 108 (90 BASE) MCG/ACT inhaler  Commonly known as:  PROVENTIL HFA;VENTOLIN HFA  Inhale 2 puffs into the lungs every 6 (six) hours as needed for wheezing or shortness of breath.     aspirin 325  MG tablet  Take 325 mg by mouth daily.     cholecalciferol 400 UNITS Tabs tablet  Commonly known as:  VITAMIN D  Take 400 Units by mouth.     FLUoxetine 10 MG tablet  Commonly known as:  PROZAC  Take 1 tablet (10 mg total) by mouth daily.     furosemide 40 MG tablet  Commonly known as:  LASIX  TAKE 1 TABLET BY MOUTH DAILY     isosorbide mononitrate 60 MG 24 hr tablet  Commonly known as:  IMDUR  1 and 1/2 tablets (90mg ) daily     metoprolol succinate 25 MG 24 hr tablet  Commonly known as:  TOPROL-XL  Take 1 tablet (25 mg total) by  mouth daily.     NEPHRO-VITE PO  Take 1 tablet by mouth daily.     nitroGLYCERIN 0.4 MG/SPRAY spray  Commonly known as:  NITROLINGUAL  Place 1 spray under the tongue every 5 (five) minutes as needed.     potassium chloride SA 20 MEQ tablet  Commonly known as:  K-DUR,KLOR-CON  TAKE 1 TABLET BY MOUTH DAILY.     pravastatin 40 MG tablet  Commonly known as:  PRAVACHOL  Take 1 tablet (40 mg total) by mouth daily.     RA VISION-VITE PRESERVE PO  Take 2 capsules by mouth daily.

## 2013-07-30 DIAGNOSIS — R4189 Other symptoms and signs involving cognitive functions and awareness: Secondary | ICD-10-CM | POA: Insufficient documentation

## 2013-08-03 ENCOUNTER — Encounter: Payer: Self-pay | Admitting: Family Medicine

## 2013-08-03 ENCOUNTER — Ambulatory Visit (INDEPENDENT_AMBULATORY_CARE_PROVIDER_SITE_OTHER)
Admission: RE | Admit: 2013-08-03 | Discharge: 2013-08-03 | Disposition: A | Discharge status: Home / Self Care | Payer: Medicare Other | Source: Ambulatory Visit | Attending: Family Medicine | Admitting: Family Medicine

## 2013-08-03 ENCOUNTER — Ambulatory Visit (INDEPENDENT_AMBULATORY_CARE_PROVIDER_SITE_OTHER): Payer: Medicare Other | Admitting: Family Medicine

## 2013-08-03 VITALS — BP 124/82 | HR 56 | Temp 98.3°F | Ht 68.0 in | Wt 192.5 lb

## 2013-08-03 DIAGNOSIS — M7741 Metatarsalgia, right foot: Secondary | ICD-10-CM

## 2013-08-03 DIAGNOSIS — M7750 Other enthesopathy of unspecified foot: Secondary | ICD-10-CM

## 2013-08-03 DIAGNOSIS — M79671 Pain in right foot: Secondary | ICD-10-CM

## 2013-08-03 DIAGNOSIS — R062 Wheezing: Secondary | ICD-10-CM

## 2013-08-03 DIAGNOSIS — M79609 Pain in unspecified limb: Secondary | ICD-10-CM

## 2013-08-03 DIAGNOSIS — J449 Chronic obstructive pulmonary disease, unspecified: Secondary | ICD-10-CM

## 2013-08-03 NOTE — Progress Notes (Signed)
[place] [address] Phone: [phone] Fax: [phone]  Patient ID: Andrew [name] MRN: [id number], DOB: 16-Jan-1924, 80 y.o. Date of Encounter: 08/03/2013  Primary Physician:  [name], MD   Chief Complaint: Foot Pain and Lung Test   Subjective:   History of Present Illness:  Andrew [name] is a 80 y.o. very pleasant male patient who presents with the following:  Follow up regarding his right forefoot pain. This is been intermittent ongoing for a long time. He has not had any accident or injury that he can recall. No significant swelling or bruising right now. Previously I looked at this, but is mostly having pain around his fourth metatarsal, today is mostly in the second and first metatarsals.  He also smoked for about 50 years, and we thought that he had some mild COPD from a number years ago and some pulmonary function testing done back then. He is using inhaler every so often right now. He did have some pulmonary fibrosis on CT scan.  Past Medical History, Surgical History, Social History, Family History, Problem List, Medications, and Allergies have been reviewed and updated if relevant.  Review of Systems:  GEN: No acute illnesses, no fevers, chills. GI: No n/v/d, eating normally Pulm: No SOB Interactive and getting along well at home.  Otherwise, ROS is as per the HPI.  Objective:   Physical Examination: BP 124/82  Pulse 56  Temp(Src) 98.3 F (36.8 C) (Oral)  Ht 5\' 8"  (1.727 m)  Wt 192 lb 8 oz (87.317 kg)  BMI 29.28 kg/m2   GEN: WDWN, NAD, Non-toxic, A & O x 3 HEENT: Atraumatic, Normocephalic. Neck supple. No masses, No LAD. Ears and Nose: No external deformity. CV: RRR, No M/G/R. No JVD. No thrill. No extra heart sounds. PULM: CTA B, no wheezes, crackles, rhonchi. No retractions. No resp. distress. No accessory muscle use. EXTR: No c/c/e NEURO Normal gait.  PSYCH: Normally interactive. Conversant. Not depressed or anxious  appearing.  Calm demeanor.   In the toe ankle, the patient has no tenderness to palpation any bony anatomy. Nontender in the mid foot. Nontender at the calcaneus. Nontender at the peroneal or posterior tibialis. Nontender at the Achilles tendon. Nontender the plantar fascia. Does have some mild tenderness on the first and second metatarsals. No significant tenderness with movement of the MTP joints.  Laboratory and Imaging Data: Dg Foot Complete Right  08/03/2013   CLINICAL DATA:  Pain.  EXAM: RIGHT FOOT COMPLETE - 3+ VIEW  COMPARISON:  None.  FINDINGS: No acute bony or joint abnormality identified. Diffuse soft tissue swelling. Surgical clips are noted over the ankle.  IMPRESSION: No acute bony abnormality.   Electronically Signed   By: Marcello Moores  Register   On: 08/03/2013 09:03    Assessment & Plan:   Metatarsalgia, right  Wheezing - Plan: Spirometry with graph  COPD, minimal-mild  X-rays of the foot are grossly unremarkable. I think this is mostly metatarsalgia. I placed upon metatarsal bar and a sports himself for him to hopefully unload some of the MTPs.  FEV1/FVC > 100 today, 50 pack-years. Albuterol prn only  New Prescriptions   No medications on file   Modified Medications   No medications on file   Orders Placed This Encounter  Procedures  . Spirometry with graph   Follow-up: No Follow-up on file. Unless noted above, the patient is to follow-up if symptoms worsen. Red flags were reviewed with the patient.  Signed,  Maud Deed.  Kellan Raffield, MD, CAQ Sports Medicine   Discontinued Medications   No medications on file   Current Medications at Discharge:   Medication List       This list is accurate as of: 08/03/13  2:03 PM.  Always use your most recent med list.               albuterol 108 (90 BASE) MCG/ACT inhaler  Commonly known as:  PROVENTIL HFA;VENTOLIN HFA  Inhale 2 puffs into the lungs every 6 (six) hours as needed for wheezing or shortness of breath.      aspirin 325 MG tablet  Take 325 mg by mouth daily.     cholecalciferol 400 UNITS Tabs tablet  Commonly known as:  VITAMIN D  Take 400 Units by mouth.     FLUoxetine 10 MG tablet  Commonly known as:  PROZAC  Take 1 tablet (10 mg total) by mouth daily.     furosemide 40 MG tablet  Commonly known as:  LASIX  TAKE 1 TABLET BY MOUTH DAILY     isosorbide mononitrate 60 MG 24 hr tablet  Commonly known as:  IMDUR  1 and 1/2 tablets (90mg ) daily     metoprolol succinate 25 MG 24 hr tablet  Commonly known as:  TOPROL-XL  Take 1 tablet (25 mg total) by mouth daily.     NEPHRO-VITE PO  Take 1 tablet by mouth daily.     nitroGLYCERIN 0.4 MG/SPRAY spray  Commonly known as:  NITROLINGUAL  Place 1 spray under the tongue every 5 (five) minutes as needed.     potassium chloride SA 20 MEQ tablet  Commonly known as:  K-DUR,KLOR-CON  TAKE 1 TABLET BY MOUTH DAILY.     pravastatin 40 MG tablet  Commonly known as:  PRAVACHOL  Take 1 tablet (40 mg total) by mouth daily.     RA VISION-VITE PRESERVE PO  Take 2 capsules by mouth daily.

## 2013-08-03 NOTE — Progress Notes (Signed)
Pre visit review using our clinic review tool, if applicable. No additional management support is needed unless otherwise documented below in the visit note. 

## 2013-08-18 ENCOUNTER — Encounter: Payer: Self-pay | Admitting: Family Medicine

## 2013-09-01 ENCOUNTER — Other Ambulatory Visit (HOSPITAL_COMMUNITY): Payer: 59

## 2013-09-03 ENCOUNTER — Ambulatory Visit (HOSPITAL_COMMUNITY): Payer: Medicare Other | Attending: Cardiology | Admitting: Radiology

## 2013-09-03 DIAGNOSIS — I059 Rheumatic mitral valve disease, unspecified: Secondary | ICD-10-CM | POA: Diagnosis not present

## 2013-09-03 DIAGNOSIS — I2510 Atherosclerotic heart disease of native coronary artery without angina pectoris: Secondary | ICD-10-CM | POA: Insufficient documentation

## 2013-09-03 DIAGNOSIS — I079 Rheumatic tricuspid valve disease, unspecified: Secondary | ICD-10-CM | POA: Diagnosis not present

## 2013-09-03 DIAGNOSIS — I509 Heart failure, unspecified: Secondary | ICD-10-CM | POA: Insufficient documentation

## 2013-09-03 DIAGNOSIS — Z8673 Personal history of transient ischemic attack (TIA), and cerebral infarction without residual deficits: Secondary | ICD-10-CM | POA: Diagnosis not present

## 2013-09-03 DIAGNOSIS — I350 Nonrheumatic aortic (valve) stenosis: Secondary | ICD-10-CM

## 2013-09-03 DIAGNOSIS — I517 Cardiomegaly: Secondary | ICD-10-CM | POA: Insufficient documentation

## 2013-09-03 DIAGNOSIS — I5032 Chronic diastolic (congestive) heart failure: Secondary | ICD-10-CM

## 2013-09-03 DIAGNOSIS — I379 Nonrheumatic pulmonary valve disorder, unspecified: Secondary | ICD-10-CM | POA: Diagnosis not present

## 2013-09-03 DIAGNOSIS — I6529 Occlusion and stenosis of unspecified carotid artery: Secondary | ICD-10-CM

## 2013-09-03 DIAGNOSIS — I25810 Atherosclerosis of coronary artery bypass graft(s) without angina pectoris: Secondary | ICD-10-CM

## 2013-09-03 DIAGNOSIS — I5030 Unspecified diastolic (congestive) heart failure: Secondary | ICD-10-CM | POA: Diagnosis not present

## 2013-09-03 DIAGNOSIS — I359 Nonrheumatic aortic valve disorder, unspecified: Secondary | ICD-10-CM | POA: Diagnosis not present

## 2013-09-03 DIAGNOSIS — J449 Chronic obstructive pulmonary disease, unspecified: Secondary | ICD-10-CM | POA: Diagnosis not present

## 2013-09-03 DIAGNOSIS — J4489 Other specified chronic obstructive pulmonary disease: Secondary | ICD-10-CM | POA: Insufficient documentation

## 2013-09-03 NOTE — Progress Notes (Signed)
Echocardiogram performed.  

## 2013-10-14 ENCOUNTER — Encounter: Payer: Self-pay | Admitting: *Deleted

## 2013-10-15 ENCOUNTER — Encounter: Payer: Self-pay | Admitting: Cardiology

## 2013-10-15 ENCOUNTER — Ambulatory Visit (INDEPENDENT_AMBULATORY_CARE_PROVIDER_SITE_OTHER): Payer: Medicare Other | Admitting: Cardiology

## 2013-10-15 VITALS — BP 132/50 | HR 58 | Ht 69.0 in | Wt 195.0 lb

## 2013-10-15 DIAGNOSIS — E7800 Pure hypercholesterolemia, unspecified: Secondary | ICD-10-CM

## 2013-10-15 DIAGNOSIS — I5032 Chronic diastolic (congestive) heart failure: Secondary | ICD-10-CM

## 2013-10-15 DIAGNOSIS — I208 Other forms of angina pectoris: Secondary | ICD-10-CM

## 2013-10-15 DIAGNOSIS — I6529 Occlusion and stenosis of unspecified carotid artery: Secondary | ICD-10-CM

## 2013-10-15 DIAGNOSIS — I209 Angina pectoris, unspecified: Secondary | ICD-10-CM

## 2013-10-15 DIAGNOSIS — I25810 Atherosclerosis of coronary artery bypass graft(s) without angina pectoris: Secondary | ICD-10-CM

## 2013-10-15 DIAGNOSIS — I350 Nonrheumatic aortic (valve) stenosis: Secondary | ICD-10-CM

## 2013-10-15 DIAGNOSIS — I359 Nonrheumatic aortic valve disorder, unspecified: Secondary | ICD-10-CM

## 2013-10-15 DIAGNOSIS — I509 Heart failure, unspecified: Secondary | ICD-10-CM

## 2013-10-15 LAB — BASIC METABOLIC PANEL
BUN: 25 mg/dL — AB (ref 6–23)
CHLORIDE: 103 meq/L (ref 96–112)
CO2: 31 meq/L (ref 19–32)
CREATININE: 1.4 mg/dL (ref 0.4–1.5)
Calcium: 8.9 mg/dL (ref 8.4–10.5)
GFR: 49.14 mL/min — ABNORMAL LOW (ref >60.00)
Glucose, Bld: 117 mg/dL — ABNORMAL HIGH (ref 70–99)
Potassium: 4.5 mEq/L (ref 3.5–5.1)
Sodium: 138 mEq/L (ref 135–145)

## 2013-10-15 LAB — BRAIN NATRIURETIC PEPTIDE: Pro B Natriuretic peptide (BNP): 312 pg/mL — ABNORMAL HIGH (ref 0.0–100.0)

## 2013-10-15 MED ORDER — ISOSORBIDE MONONITRATE ER 60 MG PO TB24: ORAL_TABLET | ORAL | Status: DC

## 2013-10-15 NOTE — Patient Instructions (Addendum)
You have been referred to Cardiac Rehab at [facility].   Your physician recommends that you have lab work today--BMET/BNP.  Your physician wants you to follow-up in: 4 months with Dr Aundra Dubin. (January 2016).  You will receive a reminder letter in the mail two months in advance. If you don't receive a letter, please call our office to schedule the follow-up appointment.

## 2013-10-16 NOTE — Progress Notes (Signed)
Patient ID: Andrew [name], male   DOB: Nov 18, 1923, 80 y.o.   MRN: [id number] PCP: Dr [name]  80 yo with history of CAD s/p CABG and moderate aortic stenosis returns for followup.   He has had a history of chronic stable angina for years.  He has chronically had chest tightness after walking fast for about 100 yards.  In 12/14, he had a Lexiscan Cardiolite which showed EF 42% with medium-sized scar in the lateral wall along with mild peri-infarct ischemia.  This was deemed a low risk study.  I started the patient on ranolazine, which seemed to help his angina, but this was subsequently stopped by nephrology.  Therefore, I increased his Imdur.   Recently, he has been doing well.  Minimal chest pain.  He has been walking before breakfast (typically had been getting angina when he would walk after a large meal).  He is having more problems now with spinal stenosis and low back pain than anything else.  He is interested in trying to be more active and asks about cardiac rehab.  Weight is stable.    Labs (11/13): K 4.4, creatinine 1.3, LDL 66, HDL 53 Labs (7/14): BNP 590 Labs (8/14): K 4.4, creatinine 1.4, BNP 218, LDL 53, HDL 43 Labs (11/14): K 4.1, creatinine 1.3 Labs (1/15): Creatinine 1.7 Labs (2/15): K 3.9, creatinine 1.4 Labs (3/15): K 4.1, creatinine 1.6 Labs (5/15): K 4.6, creatinine 1.3, LDL 50, HDL 45  PMH: 1. CAD: s/p CABG in 1991 with LIMA-LAD, SVG-PDA, seq SVG to OM 1,2,3.  [place] [address] complicated by cholesterol atheroemboli and AKI.  One limb of seq SVG-OM 1,2,3 known to be occluded.  Chronic stable angina.  Lexiscan Cardiolite (12/14) with EF 42%, inferolateral hypokinesis, medium-sized scar in the lateral wall with mild peri-infarct ischemia, overall low risk.  2. Carotid stenosis: 7/13 carotid dopplers 40-59% on right, 60-79% on left.  Carotids (1/14) with 11-91% RICA, 47-82% LICA.  Carotids (9/56) with 21-30% LICA, 86-57% RICA.  Possible TIA in 4/15. Carotids (8/46) with 96-29% LICA, 52-84%  RICA.  Followed by [facility].  3. GERD 4. Polyps 5. Thyroid nodule 6. S/p excision of acoustic neuroma 7. Prostate cancer treated with seed implantation in 2002 8. COPD: mild 9. Cholecystectomy 10. CKD 11. Aortic stenosis: Moderate.  Echo (2/14) with EF 50-55%, mild LVH, moderate AS with AVA 1.3 cm^2 and mean gradient 23 mmHg.  Echo (8/14) with EF 55-60%, moderate LVH, moderate AS with mean gradient 27 mmHg and AVA 1.03 cm^2, mild to moderate AI, mild MR.  Echo (8/15) with EF 50-55%, moderate AS with mean gradient 28/peak 45, mild AI, mild MR, severe LAE.  12. Chronic diastolic CHF.  13. Macular degeneration 14. Mild to moderate OSA on sleep study. He is not on CPAP.  15. Depression 16. Migraines 17. Low back pain/spinal stenosis  SH: Retired Chief Financial Officer, quit smoking 1963, married, lives in [facility].  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   Current Outpatient Prescriptions  Medication Sig Dispense Refill  . albuterol (PROVENTIL HFA;VENTOLIN HFA) 108 (90 BASE) MCG/ACT inhaler Inhale 2 puffs into the lungs every 6 (six) hours as needed for wheezing or shortness of breath.  1 Inhaler  3  . aspirin 325 MG tablet Take 325 mg by mouth daily.      . B Complex-C-Folic Acid (NEPHRO-VITE PO) Take 1 tablet by mouth daily.        . cholecalciferol (VITAMIN D) 400 UNITS TABS Take  400 Units by mouth.      [name] 10 MG tablet Take 1 tablet (10 mg total) by mouth daily.  90 tablet  3  . furosemide (LASIX) 40 MG tablet TAKE 1 TABLET BY MOUTH DAILY  30 tablet  1  . isosorbide mononitrate (IMDUR) 60 MG 24 hr tablet 1 and 1/2 tablets (90mg ) daily  135 tablet  3  . metoprolol succinate (TOPROL-XL) 25 MG 24 hr tablet Take 1 tablet (25 mg total) by mouth daily.  90 tablet  3  . Multiple Vitamins-Minerals (RA VISION-VITE PRESERVE PO) Take 2 capsules by mouth daily.        . nitroGLYCERIN (NITROLINGUAL) 0.4 MG/SPRAY spray Place 1 spray under the tongue every 5 (five)  minutes as needed.  1 g  12  . potassium chloride SA (K-DUR,KLOR-CON) 20 MEQ tablet TAKE 1 TABLET BY MOUTH DAILY.  30 tablet  3  . pravastatin (PRAVACHOL) 40 MG tablet Take 1 tablet (40 mg total) by mouth daily.  90 tablet  3   No current facility-administered medications for this visit.    BP 132/50  Pulse 58  Ht 5\' 9"  (1.753 m)  Wt 195 lb (88.451 kg)  BMI 28.78 kg/m2 General: NAD Neck: JVP 7 cm, no thyromegaly or thyroid nodule.  Lungs: Slight crackles at the bases bilaterally.  CV: Nondisplaced PMI.  Heart regular S1/S2, no S3/S4, 2/6 SEM RUSB with clear S2.  No edema.  Right carotid bruit.  Normal pedal pulses.  Abdomen: Soft, nontender, no hepatosplenomegaly, no distention.  Skin: Intact without lesions or rashes.  Neurologic: Alert and oriented x 3.  Psych: Normal affect. Extremities: No clubbing or cyanosis.   Assessment/Plan: 1. CAD: s/p CABG with long history of chronic stable angina.  He had a very difficult time with last cardiac cath (cholesterol atheroemboli and AKI) and wants cath only as last resort.  Lexiscan Cardiolite in 12/14 showed lateral MI with mild peri-infarct ischemia consistent with known occluded limb of sequential SVG-OMs (low risk).  He is now off ranolazine (told to stop by nephrology).  Less angina now with Imdur increased to 90 mg daily. - Continue Toprol XL and Imdur at current doses.  - Continue ASA and statin.  - I will see if I can get him into cardiac rehab.  2. Chronic diastolic CHF: EF 12-87% on 8/15 echo.  Patient looks euvolemic on current Lasix dose. - Continue Lasix 40 mg daily.   - BMET/BNP today.  3. Hyperlipidemia: Good lipids 5/15.  4. CKD: Last creatinine 1.3.  Repeating today.   5. Aortic stenosis: Moderate on 8/15 echo.    6. Carotid stenosis: Followed by [facility]. No further TIA-like symptoms. 7. OSA: Mild to moderate OSA on sleep study.  Not on CPAP at this point.    Andrew [name] 10/16/2013

## 2013-12-28 ENCOUNTER — Encounter: Payer: Self-pay | Admitting: Family

## 2013-12-28 ENCOUNTER — Ambulatory Visit (INDEPENDENT_AMBULATORY_CARE_PROVIDER_SITE_OTHER): Payer: Medicare Other | Admitting: Family

## 2013-12-28 ENCOUNTER — Ambulatory Visit (HOSPITAL_COMMUNITY)
Admission: RE | Admit: 2013-12-28 | Discharge: 2013-12-28 | Disposition: A | Discharge status: Home / Self Care | Payer: Medicare Other | Source: Ambulatory Visit | Attending: Family | Admitting: Family

## 2013-12-28 VITALS — BP 125/64 | HR 48 | Resp 14 | Ht 69.0 in | Wt 200.0 lb

## 2013-12-28 DIAGNOSIS — I6523 Occlusion and stenosis of bilateral carotid arteries: Secondary | ICD-10-CM

## 2013-12-28 NOTE — Progress Notes (Signed)
Established Carotid Patient   History of Present Illness  Andrew [name] is a 80 y.o. male patient of [name] who returns for follow up of known carotid stenosis.  Patient has not had previous carotid artery intervention.  The patient denies any history of TIA or stroke symptoms, specifically the patient denies a history of amaurosis fugax or monocular blindness, denies a history unilateral  of facial drooping, denies a history of hemiplegia, and denies a history of receptive or expressive aphasia.    The patient reports New Medical or Surgical History: recent angina, pt states Dr. Aundra Dubin is aware of this, saw Dr. Aundra Dubin 2-3 months ago and is scheduled to see him again in January; states his activity is limited by his angina. Pt states he has spinal stenosis and receives injections in his back to help this, he does not seem to have claudication symptoms, denies non healing wounds.  Pt Diabetic: No Pt smoker: former smoker, quit in 1963  Pt meds include: Statin : Yes ASA: Yes Other anticoagulants/antiplatelets: no   Past Medical History  Diagnosis Date  . HYPERCHOLESTEROLEMIA   . CAD, ARTERY BYPASS GRAFT   . CAROTID ARTERY STENOSIS 03/07/2010    80%  . AORTIC STENOSIS   . GASTROESOPHAGEAL REFLUX DISEASE   . Skin cancer   . History of colonic polyps     1999, 2004  . Allergic rhinitis   . Thyroid nodule 05/2010    Abnormal biopsy, 80 year old patient and his wife have made informed decision to not pursue surgical resection. Potential risk including cancer has been thoroughly discussed with the patient.  . S/P excision of acoustic neuroma   . History of prostate cancer 2002    s/p treatment with seeds / radiation  . Lumbar spinal stenosis 07/13/2009  . COPD, mild 12/22/2010  . Pancreatic cyst 01/29/2013    Noted on MRI scan from [facility] on 07/27/2011.  I reviewed report at patient request on 01-29-2013.  "Small cystic focus in the posterior pancreatic  head.  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  Follow up MRI is advised. The main pancreatic duct remains normal in appearance."  Patient do  . CHF (congestive heart failure)     Social History History  Substance Use Topics  . Smoking status: Former Smoker -- 2.00 packs/day for 20 years    Types: Cigarettes    Quit date: 01/22/1961  . Smokeless tobacco: Never Used  . Alcohol Use: 4.2 oz/week    7 Shots of liquor per week     Comment: 1 drink nightly    Family History Family History  Problem Relation Age of Onset  . Emphysema Brother   . Lung cancer Brother   . Alcohol abuse    . Arthritis    . Cancer    . Macular degeneration    . Heart disease Father   . Hypertension Father   . Heart attack Father   . Colon cancer Sister   . Non-Hodgkin's lymphoma Sister   . Heart disease Brother   . Colon cancer Sister   . Skin cancer Sister   . Lung cancer Daughter     Surgical History Past Surgical History  Procedure Laterality Date  . Coronary artery bypass graft    . Transperineal implatation of palladium    . Cholecystectomy    . Shoulder surgery    . Total knee arthroplasty    .  Cataract extraction    . Tonsillectomy and adenoidectomy  1932  . Craniectomy for excision of acoustic neuroma  1985  . Cataract extraction  2010    No Known Allergies  Current Outpatient Prescriptions  Medication Sig Dispense Refill  . albuterol (PROVENTIL HFA;VENTOLIN HFA) 108 (90 BASE) MCG/ACT inhaler Inhale 2 puffs into the lungs every 6 (six) hours as needed for wheezing or shortness of breath. 1 Inhaler 3  . aspirin 325 MG tablet Take 325 mg by mouth daily.    . B Complex-C-Folic Acid (NEPHRO-VITE PO) Take 1 tablet by mouth daily.      . cholecalciferol (VITAMIN D) 400 UNITS TABS Take 400 Units by mouth.    [name] 10 MG tablet Take 1 tablet (10 mg total) by mouth daily. 90 tablet 3  .  furosemide (LASIX) 40 MG tablet TAKE 1 TABLET BY MOUTH DAILY 30 tablet 1  . isosorbide mononitrate (IMDUR) 60 MG 24 hr tablet 1 and 1/2 tablets (90mg ) daily 135 tablet 3  . metoprolol succinate (TOPROL-XL) 25 MG 24 hr tablet Take 1 tablet (25 mg total) by mouth daily. 90 tablet 3  . Multiple Vitamins-Minerals (RA VISION-VITE PRESERVE PO) Take 2 capsules by mouth daily.      . nitroGLYCERIN (NITROLINGUAL) 0.4 MG/SPRAY spray Place 1 spray under the tongue every 5 (five) minutes as needed. 1 g 12  . potassium chloride SA (K-DUR,KLOR-CON) 20 MEQ tablet TAKE 1 TABLET BY MOUTH DAILY. 30 tablet 3  . pravastatin (PRAVACHOL) 40 MG tablet Take 1 tablet (40 mg total) by mouth daily. 90 tablet 3   No current facility-administered medications for this visit.    Review of Systems : See HPI for pertinent positives and negatives.  Physical Examination  Filed Vitals:   12/28/13 1418 12/28/13 1420  BP: 138/62 125/64  Pulse: 48 48  Resp:  14  Height:  5\' 9"  (1.753 m)  Weight:  200 lb (90.719 kg)  SpO2:  98%   Body mass index is 29.52 kg/(m^2).   General: WDWN male in NAD GAIT: normal Eyes: PERRLA Pulmonary:  Non-labored, CTAB, Negative  Rales, Negative rhonchi, & Negative wheezing.  Cardiac: regular Rhythm,  Positive detected murmur.  VASCULAR EXAM Carotid Bruits Right Left   transmitted cardiac murmur Positive    Aorta is not palpable. Radial pulses are 2+ palpable and equal.                                                                                                                            LE Pulses Right Left       POPLITEAL  not palpable   not palpable       POSTERIOR TIBIAL  not palpable   not palpable        DORSALIS PEDIS      ANTERIOR TIBIAL not palpable  not palpable     Gastrointestinal: soft, nontender, BS WNL, no r/g,  negative palpated masses.  Musculoskeletal: Negative  muscle atrophy/wasting. M/S 5/5 throughout, Extremities without ischemic  changes.  Neurologic: A&O X 3; Appropriate Affect, Speech is normal CN 2-12 intact except is hard of hearing, Pain and light touch intact in extremities, Motor exam as listed above.   Non-Invasive Vascular Imaging CAROTID DUPLEX 12/28/2013   CEREBROVASCULAR DUPLEX EVALUATION    INDICATION: Follow-up carotid disease     PREVIOUS INTERVENTION(S):     DUPLEX EXAM:     RIGHT  LEFT  Peak Systolic Velocities (cm/s) End Diastolic Velocities (cm/s) Plaque LOCATION Peak Systolic Velocities (cm/s) End Diastolic Velocities (cm/s) Plaque  90 10  CCA PROXIMAL 149 11   73 13  CCA MID 64 11   66 12 HT CCA DISTAL 61 9 HT  126 8  ECA 114 0   191 45 HT ICA PROXIMAL 316 95 IP  68 20  ICA MID 148 26   58 12  ICA DISTAL 100 26     2.9 ICA / CCA Ratio (PSV) 5.2  Antegrade  Vertebral Flow Antegrade   597 Brachial Systolic Pressure (mmHg) 416  Within normal limits  Brachial Artery Waveforms Within normal limits     Plaque Morphology:  HM = Homogeneous, HT = Heterogeneous, CP = Calcific Plaque, SP = Smooth Plaque, IP = Irregular Plaque  ADDITIONAL FINDINGS:     IMPRESSION: 1. Evidence of 40%-59% stenosis of the right internal carotid artery.  2. 60%-79% stenosis of the left internal carotid artery by end-diastolic criteria. Plaque is irregular and difficult to evaluate. Peak systolic and ratio criteria are consistent with 80%-99% category. 3. Bilateral vertebral artery is antegrade.     Compared to the previous exam:  No significant change compared to prior exam.       Assessment: Andrew [name] is a 80 y.o. male who presents with asymptomatic 40%-59% stenosis of the right internal carotid artery and 60%-79% stenosis of the left internal carotid artery by end-diastolic criteria. Plaque is irregular and difficult to evaluate. Peak systolic and ratio criteria are consistent with 80%-99% category. No significant change compared to prior exam.  I discussed today's carotid Duplex result with Dr.  Trula Slade.   Plan: Follow-up in 6 months with Carotid Duplex.   I discussed in depth with the patient the nature of atherosclerosis, and emphasized the importance of maximal medical management including strict control of blood pressure, blood glucose, and lipid levels, obtaining regular exercise, and continued cessation of smoking.  The patient is aware that without maximal medical management the underlying atherosclerotic disease process will progress, limiting the benefit of any interventions. The patient was given information about stroke prevention and what symptoms should prompt the patient to seek immediate medical care. Thank you for allowing [place] to participate in this patient's care.  Clemon Chambers, RN, MSN, FNP-C [facility] Office: [facility] Physician: Trula Slade  12/28/2013 1:24 PM

## 2013-12-28 NOTE — Patient Instructions (Signed)
Stroke Prevention Some medical conditions and behaviors are associated with an increased chance of having a stroke. You may prevent a stroke by making healthy choices and managing medical conditions. HOW CAN I REDUCE MY RISK OF HAVING A STROKE?   Stay physically active. Get at least 30 minutes of activity on most or all days.  Do not smoke. It may also be helpful to avoid exposure to secondhand smoke.  Limit alcohol use. Moderate alcohol use is considered to be:  No more than 2 drinks per day for men.  No more than 1 drink per day for nonpregnant women.  Eat healthy foods. This involves:  Eating 5 or more servings of fruits and vegetables a day.  Making dietary changes that address high blood pressure (hypertension), high cholesterol, diabetes, or obesity.  Manage your cholesterol levels.  Making food choices that are high in fiber and low in saturated fat, trans fat, and cholesterol may control cholesterol levels.  Take any prescribed medicines to control cholesterol as directed by your health care provider.  Manage your diabetes.  Controlling your carbohydrate and sugar intake is recommended to manage diabetes.  Take any prescribed medicines to control diabetes as directed by your health care provider.  Control your hypertension.  Making food choices that are low in salt (sodium), saturated fat, trans fat, and cholesterol is recommended to manage hypertension.  Take any prescribed medicines to control hypertension as directed by your health care provider.  Maintain a healthy weight.  Reducing calorie intake and making food choices that are low in sodium, saturated fat, trans fat, and cholesterol are recommended to manage weight.  Stop drug abuse.  Avoid taking birth control pills.  Talk to your health care provider about the risks of taking birth control pills if you are over 35 years old, smoke, get migraines, or have ever had a blood clot.  Get evaluated for sleep  disorders (sleep apnea).  Talk to your health care provider about getting a sleep evaluation if you snore a lot or have excessive sleepiness.  Take medicines only as directed by your health care provider.  For some people, aspirin or blood thinners (anticoagulants) are helpful in reducing the risk of forming abnormal blood clots that can lead to stroke. If you have the irregular heart rhythm of atrial fibrillation, you should be on a blood thinner unless there is a good reason you cannot take them.  Understand all your medicine instructions.  Make sure that other conditions (such as anemia or atherosclerosis) are addressed. SEEK IMMEDIATE MEDICAL CARE IF:   You have sudden weakness or numbness of the face, arm, or leg, especially on one side of the body.  Your face or eyelid droops to one side.  You have sudden confusion.  You have trouble speaking (aphasia) or understanding.  You have sudden trouble seeing in one or both eyes.  You have sudden trouble walking.  You have dizziness.  You have a loss of balance or coordination.  You have a sudden, severe headache with no known cause.  You have new chest pain or an irregular heartbeat. Any of these symptoms may represent a serious problem that is an emergency. Do not wait to see if the symptoms will go away. Get medical help at once. Call your local emergency services (911 in [place] Do not drive yourself to the hospital. Document Released: 02/16/2004 Document Revised: 05/25/2013 Document Reviewed: 07/11/2012 ExitCare Patient Information 2015 [employer]. This information is not intended to replace advice given   to you by your health care provider. Make sure you discuss any questions you have with your health care provider.  

## 2014-01-18 ENCOUNTER — Other Ambulatory Visit: Payer: Self-pay | Admitting: Cardiology

## 2014-01-19 ENCOUNTER — Encounter (INDEPENDENT_AMBULATORY_CARE_PROVIDER_SITE_OTHER): Payer: Self-pay | Admitting: Surgery

## 2014-01-27 ENCOUNTER — Other Ambulatory Visit: Payer: Self-pay | Admitting: Surgery

## 2014-01-27 DIAGNOSIS — E041 Nontoxic single thyroid nodule: Secondary | ICD-10-CM

## 2014-01-29 ENCOUNTER — Ambulatory Visit
Admission: RE | Admit: 2014-01-29 | Discharge: 2014-01-29 | Disposition: A | Discharge status: Home / Self Care | Payer: Self-pay | Source: Ambulatory Visit | Attending: Surgery | Admitting: Surgery

## 2014-01-29 DIAGNOSIS — E041 Nontoxic single thyroid nodule: Secondary | ICD-10-CM

## 2014-02-15 ENCOUNTER — Ambulatory Visit: Payer: Medicare Other | Admitting: Cardiology

## 2014-02-22 ENCOUNTER — Other Ambulatory Visit (HOSPITAL_COMMUNITY): Payer: 59

## 2014-02-22 ENCOUNTER — Ambulatory Visit: Payer: 59 | Admitting: Family

## 2014-02-24 ENCOUNTER — Other Ambulatory Visit: Payer: Self-pay | Admitting: Dermatology

## 2014-03-01 ENCOUNTER — Other Ambulatory Visit: Payer: Self-pay | Admitting: Cardiology

## 2014-03-17 ENCOUNTER — Other Ambulatory Visit: Payer: Self-pay | Admitting: Cardiology

## 2014-03-23 ENCOUNTER — Other Ambulatory Visit: Payer: Self-pay | Admitting: Cardiology

## 2014-04-28 ENCOUNTER — Ambulatory Visit (INDEPENDENT_AMBULATORY_CARE_PROVIDER_SITE_OTHER): Payer: Medicare Other | Admitting: Family Medicine

## 2014-04-28 VITALS — BP 132/70 | HR 52 | Temp 97.8°F | Ht 69.0 in | Wt 197.0 lb

## 2014-04-28 DIAGNOSIS — G459 Transient cerebral ischemic attack, unspecified: Secondary | ICD-10-CM

## 2014-04-28 DIAGNOSIS — Z23 Encounter for immunization: Secondary | ICD-10-CM | POA: Diagnosis not present

## 2014-04-28 DIAGNOSIS — M48061 Spinal stenosis, lumbar region without neurogenic claudication: Secondary | ICD-10-CM

## 2014-04-28 DIAGNOSIS — M4806 Spinal stenosis, lumbar region: Secondary | ICD-10-CM | POA: Diagnosis not present

## 2014-04-28 DIAGNOSIS — N182 Chronic kidney disease, stage 2 (mild): Secondary | ICD-10-CM

## 2014-04-28 DIAGNOSIS — J449 Chronic obstructive pulmonary disease, unspecified: Secondary | ICD-10-CM | POA: Diagnosis not present

## 2014-04-28 DIAGNOSIS — F324 Major depressive disorder, single episode, in partial remission: Secondary | ICD-10-CM

## 2014-04-28 DIAGNOSIS — R269 Unspecified abnormalities of gait and mobility: Secondary | ICD-10-CM | POA: Diagnosis not present

## 2014-04-28 DIAGNOSIS — I208 Other forms of angina pectoris: Secondary | ICD-10-CM

## 2014-04-28 DIAGNOSIS — I209 Angina pectoris, unspecified: Secondary | ICD-10-CM

## 2014-04-28 DIAGNOSIS — H3530 Unspecified macular degeneration: Secondary | ICD-10-CM

## 2014-04-28 DIAGNOSIS — I5032 Chronic diastolic (congestive) heart failure: Secondary | ICD-10-CM

## 2014-04-28 NOTE — Progress Notes (Signed)
Pre visit review using our clinic review tool, if applicable. No additional management support is needed unless otherwise documented below in the visit note. 

## 2014-04-28 NOTE — Progress Notes (Signed)
Dr. Frederico Hamman T. Ajani Schnieders, MD, [facility] [place], [address] Phone: [phone] Fax: [phone]  04/28/2014  Patient: Andrew [name], MRN: [id number], DOB: 1923-07-25, 81 y.o.  Primary Physician:  [name], MD  Chief Complaint: Follow-up  Subjective:   Andrew [name] is a 81 y.o. very pleasant male patient who presents with the following:  Back and hip will hurt. Balance will bother him.  Cannot drive, cannot read.   Elderly patient with multiple medical problems including coronary disease, status post bypass grafting, angina with exercise, chronic diastolic heart failure, mild COPD, and lumbar spinal stenosis, chronic back pain, minimal memory changes, and he is most recently is also had some intermittent depression.  Right now is get several issues that are mostly bothering him including his back pain which altered his gait somewhat, and he is using a cane all the time.  He feels like he has gotten less stable with walking over time.  Also starting to lose eyesight, and is at best 20/70.  Even with correction.  He has macular degeneration.  He also has had 8-10 times or he is lost his hearing for hours at a time.  The ENTs of not been able to really determine an exact way to treat this or to give this up diagnosis.  Handicapped - needs refill  Sometimes has woken up deaf - went to Dr. Constance Holster a couple of time.   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   Immunization History  Administered Date(s) Administered  . Influenza Split 10/26/2010, 10/12/2012  . Influenza Whole 11/03/2008, 10/07/2009  . Influenza, High Dose Seasonal PF 10/07/2013  . Tdap 12/20/2010  . Zoster 01/22/2006    Prevnar-13.  Handicapped paper  Past Medical History, Surgical History, Social History, Family History, Problem List, Medications, and Allergies have been reviewed and updated if relevant.  GEN: No fevers, chills. Nontoxic. Primarily MSK c/o  today. MSK: Detailed in the HPI GI: tolerating PO intake without difficulty Neuro: No numbness, parasthesias, or tingling associated. Otherwise the pertinent positives of the ROS are noted above.   Objective:   BP 132/70 mmHg  Pulse 52  Temp(Src) 97.8 F (36.6 C) (Oral)  Ht 5\' 9"  (1.753 m)  Wt 197 lb (89.359 kg)  BMI 29.08 kg/m2  SpO2 95%   GEN: WDWN, NAD, Non-toxic, A & O x 3 HEENT: Atraumatic, Normocephalic. Neck supple. No masses, No LAD. Ears and Nose: No external deformity. CV: RRR, No 2/6 SEM. No JVD. No thrill. No extra heart sounds. PULM: CTA B, no wheezes, crackles, rhonchi. No retractions. No resp. distress. No accessory muscle use. EXTR: No c/c/e  PSYCH: Normally interactive. Conversant. Not depressed or anxious appearing.  Calm demeanor.    Radiology: Lab Results  Component Value Date   WBC 6.1 05/30/2013   HGB 15.2 05/30/2013   HCT 47.5 05/30/2013   PLT 97.0* 11/28/2011   GLUCOSE 117* 10/15/2013   CHOL 119 06/12/2013   TRIG 122.0 06/12/2013   HDL 45.00 06/12/2013   LDLDIRECT 67.0 03/13/2010   LDLCALC 50 06/12/2013   ALT 24 08/23/2011   AST 31 08/23/2011   NA 138 10/15/2013   K 4.5 10/15/2013   CL 103 10/15/2013   CREATININE 1.4 10/15/2013   BUN 25* 10/15/2013   CO2 31 10/15/2013   TSH 1.57 02/02/2013   PSA refused 01/03/2009    Assessment and Plan:   Gait disturbance - Plan: Ambulatory referral to Physical Therapy  Lumbar spinal stenosis  RENAL DISEASE, CHRONIC, MILD  COPD, minimal-mild  Chronic diastolic CHF (congestive heart failure)  Need for prophylactic vaccination against Streptococcus pneumoniae (pneumococcus) - Plan: Pneumococcal conjugate vaccine 13-valent IM  Transient cerebral ischemia, unspecified transient cerebral ischemia type  Major depressive disorder, single episode, in partial remission  Angina, class II  Macular degeneration (senile) of retina  He is doing reasonably well, but he does have almost a 81 year old  body with multiple medical problems involving multiple organ systems.  He is frustrated.  Is a little bit depressed.  He does think that the SSRI that would put him on has helped.  He is frustrated with his eyesight, and is worried about his hearing.  They're also worried about his gait.  I suggested that they go to physical therapy to work on gait training and balance to decrease his risk of falls.  Continue to use his cane.  Follow-up: Return in about 6 months (around 10/28/2014) for Medicare Physical.  New Prescriptions   No medications on file   Orders Placed This Encounter  Procedures  . Pneumococcal conjugate vaccine 13-valent IM  . Ambulatory referral to Physical Therapy    Signed,  Frederico Hamman T. Neena Beecham, MD   Patient's Medications  New Prescriptions   No medications on file  Previous Medications   ALBUTEROL (PROVENTIL HFA;VENTOLIN HFA) 108 (90 BASE) MCG/ACT INHALER    Inhale 2 puffs into the lungs every 6 (six) hours as needed for wheezing or shortness of breath.   ASPIRIN 325 MG TABLET    Take 325 mg by mouth daily.   B COMPLEX-C-FOLIC ACID (NEPHRO-VITE PO)    Take 1 tablet by mouth daily.     CHOLECALCIFEROL (VITAMIN D) 400 UNITS TABS    Take 400 Units by mouth.   FLUOXETINE (PROZAC) 10 MG TABLET    Take 1 tablet (10 mg total) by mouth daily.   FUROSEMIDE (LASIX) 40 MG TABLET    TAKE 1 TABLET BY MOUTH EVERY MORNING AND 1/2 TABEVERY EVENING   ISOSORBIDE MONONITRATE (IMDUR) 60 MG 24 HR TABLET    1 and 1/2 tablets (90mg ) daily   METOPROLOL SUCCINATE (TOPROL-XL) 25 MG 24 HR TABLET    TAKE 1 TABLET DAILY   MULTIPLE VITAMINS-MINERALS (RA VISION-VITE PRESERVE PO)    Take 2 capsules by mouth daily.     NITROGLYCERIN (NITROLINGUAL) 0.4 MG/SPRAY SPRAY    Place 1 spray under the tongue every 5 (five) minutes as needed.   POTASSIUM CHLORIDE SA (K-DUR,KLOR-CON) 20 MEQ TABLET    TAKE 1 TABLET BY MOUTH DAILY.   PRAVASTATIN (PRAVACHOL) 40 MG TABLET    TAKE 1 TABLET DAILY  Modified  Medications   No medications on file  Discontinued Medications   No medications on file

## 2014-04-29 ENCOUNTER — Encounter: Payer: Self-pay | Admitting: Family Medicine

## 2014-04-29 DIAGNOSIS — H3530 Unspecified macular degeneration: Secondary | ICD-10-CM | POA: Insufficient documentation

## 2014-04-29 HISTORY — DX: Unspecified macular degeneration: H35.30

## 2014-05-10 ENCOUNTER — Ambulatory Visit: Payer: Medicare Other | Admitting: Cardiology

## 2014-05-27 ENCOUNTER — Other Ambulatory Visit: Payer: Self-pay | Admitting: Family Medicine

## 2014-06-02 ENCOUNTER — Ambulatory Visit (INDEPENDENT_AMBULATORY_CARE_PROVIDER_SITE_OTHER): Payer: Medicare Other | Admitting: Cardiology

## 2014-06-02 ENCOUNTER — Encounter: Payer: Self-pay | Admitting: Cardiology

## 2014-06-02 VITALS — BP 124/50 | HR 50 | Ht 69.0 in | Wt 195.0 lb

## 2014-06-02 DIAGNOSIS — N183 Chronic kidney disease, stage 3 unspecified: Secondary | ICD-10-CM

## 2014-06-02 DIAGNOSIS — I208 Other forms of angina pectoris: Secondary | ICD-10-CM | POA: Diagnosis not present

## 2014-06-02 DIAGNOSIS — I739 Peripheral vascular disease, unspecified: Secondary | ICD-10-CM

## 2014-06-02 DIAGNOSIS — I350 Nonrheumatic aortic (valve) stenosis: Secondary | ICD-10-CM

## 2014-06-02 DIAGNOSIS — Z951 Presence of aortocoronary bypass graft: Secondary | ICD-10-CM

## 2014-06-02 DIAGNOSIS — N289 Disorder of kidney and ureter, unspecified: Secondary | ICD-10-CM | POA: Diagnosis not present

## 2014-06-02 DIAGNOSIS — I209 Angina pectoris, unspecified: Secondary | ICD-10-CM

## 2014-06-02 DIAGNOSIS — N189 Chronic kidney disease, unspecified: Secondary | ICD-10-CM

## 2014-06-02 LAB — BASIC METABOLIC PANEL
BUN: 29 mg/dL — ABNORMAL HIGH (ref 6–23)
CO2: 31 mEq/L (ref 19–32)
Calcium: 9.1 mg/dL (ref 8.4–10.5)
Chloride: 101 mEq/L (ref 96–112)
Creatinine, Ser: 1.54 mg/dL — ABNORMAL HIGH (ref 0.40–1.50)
GFR: 45.41 mL/min — ABNORMAL LOW (ref >60.00)
Glucose, Bld: 85 mg/dL (ref 70–99)
Potassium: 4.3 mEq/L (ref 3.5–5.1)
Sodium: 137 mEq/L (ref 135–145)

## 2014-06-02 NOTE — Assessment & Plan Note (Addendum)
CABG '91, cath '04 complicated by renal injury. Stable today, no chest pain. Last Myoview 2014, low risk

## 2014-06-02 NOTE — Assessment & Plan Note (Signed)
No change in dopplers Dec 2015

## 2014-06-02 NOTE — Patient Instructions (Signed)
Medication Instructions:  Your physician recommends that you continue on your current medications as directed. Please refer to the Current Medication list given to you today.  Labwork: Your physician recommends that you have labs today - BMET.  Testing/Procedures: NONE  Follow-Up: Your physician wants you to follow-up in: 6 months with Dr. Aundra Dubin. You will receive a reminder letter in the mail two months in advance. If you don't receive a letter, please call our office to schedule the follow-up appointment.

## 2014-06-02 NOTE — Assessment & Plan Note (Signed)
Due for BMP. He is also followed by Dr Justin Mend who saw him when he had his acute renal injury in 2004

## 2014-06-02 NOTE — Progress Notes (Signed)
06/02/2014 Andrew [name]   08-06-1923  [id number]  Primary Physician [name], MD Primary Cardiologist: Dr Aundra Dubin  HPI:  81 y/o followed by Dr Aundra Dubin with a history of prior CABG in 1991. In 2004 he had a cath complicated by embolic shower and acute kidney injury. He has CRI stage 3 now. Other problems include moderate AS and moderate carotid disease. His last Myoview in 2014 was low risk and his last carotid doppler in Dec 2015 showed no change. He is here for a 6 month follow up. His wife accompanied him today. He denies any unusual chest pain or dyspnea. He missed his usual appointment this winter secondary to weather and was re scheduled to my schedule today.    Current Outpatient Prescriptions  Medication Sig Dispense Refill  . albuterol (PROVENTIL HFA;VENTOLIN HFA) 108 (90 BASE) MCG/ACT inhaler Inhale 2 puffs into the lungs every 6 (six) hours as needed for wheezing or shortness of breath. 1 Inhaler 3  . aspirin 325 MG tablet Take 325 mg by mouth daily.    . B Complex-C-Folic Acid (NEPHRO-VITE PO) Take 1 tablet by mouth daily.      . cholecalciferol (VITAMIN D) 400 UNITS TABS Take 400 Units by mouth.    [name] 10 MG tablet TAKE 1 TABLET BY MOUTH DAILY. 90 tablet 1  . furosemide (LASIX) 40 MG tablet TAKE 1 TABLET BY MOUTH EVERY MORNING AND 1/2 TABEVERY EVENING 135 tablet 3  . isosorbide mononitrate (IMDUR) 60 MG 24 hr tablet 1 and 1/2 tablets (90mg ) daily 135 tablet 3  . metoprolol succinate (TOPROL-XL) 25 MG 24 hr tablet TAKE 1 TABLET DAILY 90 tablet 1  . Multiple Vitamins-Minerals (RA VISION-VITE PRESERVE PO) Take 2 capsules by mouth daily.      . nitroGLYCERIN (NITROLINGUAL) 0.4 MG/SPRAY spray Place 1 spray under the tongue every 5 (five) minutes as needed. 1 g 12  . potassium chloride SA (K-DUR,KLOR-CON) 20 MEQ tablet TAKE 1 TABLET BY MOUTH DAILY. 90 tablet 1  . pravastatin (PRAVACHOL) 40 MG tablet TAKE 1 TABLET DAILY 90 tablet 2   No current  facility-administered medications for this visit.    No Known Allergies  History   Social History  . Marital Status: Married    Spouse Name: N/A  . Number of Children: N/A  . Years of Education: N/A   Occupational History  . retired  At [employer]    and [employer] Topics  . Smoking status: Former Smoker -- 2.00 packs/day for 20 years    Types: Cigarettes    Quit date: 01/22/1961  . Smokeless tobacco: Never Used  . Alcohol Use: 4.2 oz/week    7 Shots of liquor per week     Comment: 1 drink nightly  . Drug Use: No  . Sexual Activity: Not on file   Other Topics Concern  . Not on file   Social History Narrative     Review of Systems: General: negative for chills, fever, night sweats or weight changes.  Cardiovascular: negative for chest pain, dyspnea on exertion, edema, orthopnea, palpitations, paroxysmal nocturnal dyspnea or shortness of breath Dermatological: negative for rash Respiratory: negative for cough or wheezing Urologic: negative for hematuria Abdominal: negative for nausea, vomiting, diarrhea, bright red blood per rectum, melena, or hematemesis Neurologic: negative for visual changes, syncope, or dizziness Deaf in his Lt ear, hearing aid in Rt. Blind Lt eye, macular degeneration in Rt eye. All other systems reviewed  and are otherwise negative except as noted above.    Blood pressure 124/50, pulse 50, height 5\' 9"  (8.416 m), weight 195 lb (88.451 kg).  General appearance: alert, cooperative, appears stated age and no distress Neck: no JVD and 2/6 Lt carotid bruit vs transmitted murmur, very soft on Rt Lungs: fine end inspiratory crackles Heart: regular rate and rhythm and 2/6 systolic murmur AOV with diminnished S2 Extremities: no edema Skin: Skin color, texture, turgor normal. No rashes or lesions Neurologic: Grossly normal  EKG: NSR, SB, IVCD  ASSESSMENT AND PLAN:   Hx of CABG CABG '91, cath '04 complicated by renal  injury. Stable today, no chest pain. Last Myoview 2014, low risk   PVD- moderate carotid disease No change in dopplers Dec 2015   Chronic renal insufficiency, stage III (moderate) Due for BMP. He is also followed by Dr Justin Mend who saw him when he had his acute renal injury in 2004    PLAN  Same Rx for now. If he has any syncope or near syncope would back off beta blocker but he seems asymptomatic at this time. I did order a BMP today. He can f/u with Dr Aundra Dubin in 6 months.  [facility] 06/02/2014 3:44 PM

## 2014-06-08 ENCOUNTER — Other Ambulatory Visit: Payer: Self-pay | Admitting: Dermatology

## 2014-06-30 ENCOUNTER — Encounter: Payer: Self-pay | Admitting: Family

## 2014-07-03 ENCOUNTER — Emergency Department (INDEPENDENT_AMBULATORY_CARE_PROVIDER_SITE_OTHER)
Admission: EM | Admit: 2014-07-03 | Discharge: 2014-07-03 | Disposition: A | Discharge status: Home / Self Care | Payer: Medicare Other | Source: Home / Self Care | Attending: Family Medicine | Admitting: Family Medicine

## 2014-07-03 ENCOUNTER — Emergency Department (INDEPENDENT_AMBULATORY_CARE_PROVIDER_SITE_OTHER): Payer: Medicare Other

## 2014-07-03 ENCOUNTER — Encounter (HOSPITAL_COMMUNITY): Payer: Self-pay

## 2014-07-03 DIAGNOSIS — S300XXA Contusion of lower back and pelvis, initial encounter: Secondary | ICD-10-CM | POA: Diagnosis not present

## 2014-07-03 DIAGNOSIS — S51811A Laceration without foreign body of right forearm, initial encounter: Secondary | ICD-10-CM | POA: Diagnosis not present

## 2014-07-03 DIAGNOSIS — W19XXXA Unspecified fall, initial encounter: Secondary | ICD-10-CM

## 2014-07-03 MED ORDER — TRAMADOL HCL 50 MG PO TABS: 50.0000 mg | ORAL_TABLET | Freq: Four times a day (QID) | ORAL | Status: DC | PRN

## 2014-07-03 NOTE — Discharge Instructions (Signed)
Thank you for coming in today. Use Tylenol for pain. Use a small amount of tramadol for severe pain. Follow-up with primary care provider.   Contusion A contusion is the result of an injury to the skin and underlying tissues and is usually caused by direct trauma. The injury results in the appearance of a bruise on the skin overlying the injured tissues. Contusions cause rupture and bleeding of the small capillaries and blood vessels and affect function, because the bleeding infiltrates muscles, tendons, nerves, or other soft tissues.  SYMPTOMS   Swelling and often a hard lump in the injured area, either superficial or deep.  Pain and tenderness over the area of the contusion.  Feeling of firmness when pressure is exerted over the contusion.  Discoloration under the skin, beginning with redness and progressing to the characteristic "black and blue" bruise. CAUSES  A contusion is typically the result of direct trauma. This is often by a blunt object.  RISK INCREASES WITH:  Sports that have a high likelihood of trauma (football, boxing, ice hockey, soccer, field hockey, martial arts, basketball, and baseball).  Sports that make falling from a height likely (high-jumping, pole-vaulting, skating, or gymnastics).  Any bleeding disorder (hemophilia) or taking medications that affect clotting (aspirin, nonsteroidal anti-inflammatory medications, or warfarin [Coumadin]).  Inadequate protection of exposed areas during contact sports. PREVENTION  Maintain physical fitness:  Joint and muscle flexibility.  Strength and endurance.  Coordination.  Wear proper protective equipment. Make sure it fits correctly. PROGNOSIS  Contusions typically heal without any complications. Healing time varies with the severity of injury and intake of medications that affect clotting. Contusions usually heal in 1 to 4 weeks. RELATED COMPLICATIONS   Damage to nearby nerves or blood vessels, causing  numbness, coldness, or paleness.  Compartment syndrome.  Bleeding into the soft tissues that leads to disability.  Infiltrative-type bleeding, leading to the calcification and impaired function of the injured muscle (rare).  Prolonged healing time if usual activities are resumed too soon.  Infection if the skin over the injury site is broken.  Fracture of the bone underlying the contusion.  Stiffness in the joint where the injured muscle crosses. TREATMENT  Treatment initially consists of resting the injured area as well as medication and ice to reduce inflammation. The use of a compression bandage may also be helpful in minimizing inflammation. As pain diminishes and movement is tolerated, the joint where the affected muscle crosses should be moved to prevent stiffness and the shortening (contracture) of the joint. Movement of the joint should begin as soon as possible. It is also important to work on maintaining strength within the affected muscles. Occasionally, extra padding over the area of contusion may be recommended before returning to sports, particularly if re-injury is likely.  MEDICATION   If pain relief is necessary these medications are often recommended:  Nonsteroidal anti-inflammatory medications, such as aspirin and ibuprofen.  Other minor pain relievers, such as acetaminophen, are often recommended.  Prescription pain relievers may be given by your caregiver. Use only as directed and only as much as you need. HEAT AND COLD  Cold treatment (icing) relieves pain and reduces inflammation. Cold treatment should be applied for 10 to 15 minutes every 2 to 3 hours for inflammation and pain and immediately after any activity that aggravates your symptoms. Use ice packs or an ice massage. (To do an ice massage fill a large styrofoam cup with water and freeze. Tear a small amount of foam from the top so  ice protrudes. Massage ice firmly over the injured area in a circle about the  size of a softball.)  Heat treatment may be used prior to performing the stretching and strengthening activities prescribed by your caregiver, physical therapist, or athletic trainer. Use a heat pack or a warm soak. SEEK MEDICAL CARE IF:   Symptoms get worse or do not improve despite treatment in a few days.  You have difficulty moving a joint.  Any extremity becomes extremely painful, numb, pale, or cool (This is an emergency!).  Medication produces any side effects (bleeding, upset stomach, or allergic reaction).  Signs of infection (drainage from skin, headache, muscle aches, dizziness, fever, or general ill feeling) occur if skin was broken. Document Released: 01/08/2005 Document Revised: 04/02/2011 Document Reviewed: 04/22/2008 [facility] Patient Information 2015 [place], [place]. This information is not intended to replace advice given to you by your health care provider. Make sure you discuss any questions you have with your health care provider.  Skin Tear Care A skin tear is a wound in which the top layer of skin has peeled off. This is a common problem with aging because the skin becomes thinner and more fragile as a person gets older. In addition, some medicines, such as oral corticosteroids, can lead to skin thinning if taken for long periods of time.  A skin tear is often repaired with tape or skin adhesive strips. This keeps the skin that has been peeled off in contact with the healthier skin beneath. Depending on the location of the wound, a bandage (dressing) may be applied over the tape or skin adhesive strips. Sometimes, during the healing process, the skin turns black and dies. Even when this happens, the torn skin acts as a good dressing until the skin underneath gets healthier and repairs itself. HOME CARE INSTRUCTIONS   Change dressings once per day or as directed by your caregiver.  Gently clean the skin tear and the area around the tear using saline solution or mild soap  and water.  Do not rub the injured skin dry. Let the area air dry.  Apply petroleum jelly or an antibiotic cream or ointment to keep the tear moist. This will help the wound heal. Do not allow a scab to form.  If the dressing sticks before the next dressing change, moisten it with warm soapy water and gently remove it.  Protect the injured skin until it has healed.  Only take over-the-counter or prescription medicines as directed by your caregiver.  Take showers or baths using warm soapy water. Apply a new dressing after the shower or bath.  Keep all follow-up appointments as directed by your caregiver.  SEEK IMMEDIATE MEDICAL CARE IF:   You have redness, swelling, or increasing pain in the skin tear.  You havepus coming from the skin tear.  You have chills.  You have a red streak that goes away from the skin tear.  You have a bad smell coming from the tear or dressing.  You have a fever or persistent symptoms for more than 2-3 days.  You have a fever and your symptoms suddenly get worse. MAKE SURE YOU:  Understand these instructions.  Will watch this condition.  Will get help right away if your child is not doing well or gets worse. Document Released: 10/03/2000 Document Revised: 10/03/2011 Document Reviewed: 07/23/2011 [facility] Patient Information 2015 [place], [place]. This information is not intended to replace advice given to you by your health care provider. Make sure you discuss any questions  you have with your health care provider.

## 2014-07-03 NOTE — ED Notes (Signed)
Pt fell backwards on to floor and now has back pain, swelling around anus and cut on right forearm

## 2014-07-03 NOTE — ED Provider Notes (Signed)
Andrew [name] is a 81 y.o. male who presents to Urgent Care today for fall. Patient fell at home today landing on his buttocks and right arm. He had his right arm against a table and suffered a skin tear. He landed on his buttocks and notes significant pain and swelling and bruising in the right buttocks. Pain is moderate. He has taken 2 Aleve which helps. No fevers or chills. No head injury or loss of consciousness.  Past Medical History  Diagnosis Date  . HYPERCHOLESTEROLEMIA   . CAD, ARTERY BYPASS GRAFT   . CAROTID ARTERY STENOSIS 03/07/2010    80%  . AORTIC STENOSIS   . GASTROESOPHAGEAL REFLUX DISEASE   . Skin cancer   . History of colonic polyps     1999, 2004  . Allergic rhinitis   . Thyroid nodule 05/2010    Abnormal biopsy, 81 year old patient and his wife have made informed decision to not pursue surgical resection. Potential risk including cancer has been thoroughly discussed with the patient.  . S/P excision of acoustic neuroma   . History of prostate cancer 2002    s/p treatment with seeds / radiation  . Lumbar spinal stenosis 07/13/2009  . COPD, mild 12/22/2010  . Pancreatic cyst 01/29/2013    Noted on MRI scan from [facility] on 07/27/2011.  I reviewed report at patient request on 01-29-2013.  "Small cystic focus in the posterior pancreatic head.  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  Follow up MRI is advised. The main pancreatic duct remains normal in appearance."  Patient do  . CHF (congestive heart failure)   . Macular degeneration (senile) of retina 04/29/2014   Past Surgical History  Procedure Laterality Date  . Coronary artery bypass graft    . Transperineal implatation of palladium    . Cholecystectomy    . Shoulder surgery    . Total knee arthroplasty    . Cataract extraction    . Tonsillectomy and adenoidectomy  1932  . Craniectomy for excision of acoustic  neuroma  1985  . Cataract extraction  2010  . Eye surgery     History  Substance Use Topics  . Smoking status: Former Smoker -- 2.00 packs/day for 20 years    Types: Cigarettes    Quit date: 01/22/1961  . Smokeless tobacco: Never Used  . Alcohol Use: 4.2 oz/week    7 Shots of liquor per week     Comment: 1 drink nightly   ROS as above Medications: No current facility-administered medications for this encounter.   Current Outpatient Prescriptions  Medication Sig Dispense Refill  . albuterol (PROVENTIL HFA;VENTOLIN HFA) 108 (90 BASE) MCG/ACT inhaler Inhale 2 puffs into the lungs every 6 (six) hours as needed for wheezing or shortness of breath. 1 Inhaler 3  . aspirin 325 MG tablet Take 325 mg by mouth daily.    . B Complex-C-Folic Acid (NEPHRO-VITE PO) Take 1 tablet by mouth daily.      . cholecalciferol (VITAMIN D) 400 UNITS TABS Take 400 Units by mouth.    [name] 10 MG tablet TAKE 1 TABLET BY MOUTH DAILY. 90 tablet 1  . furosemide (LASIX) 40 MG tablet TAKE 1 TABLET BY MOUTH EVERY MORNING AND 1/2 TABEVERY EVENING 135 tablet 3  . isosorbide mononitrate (IMDUR) 60 MG 24 hr tablet 1 and 1/2 tablets (90mg ) daily 135 tablet 3  . metoprolol succinate (  TOPROL-XL) 25 MG 24 hr tablet TAKE 1 TABLET DAILY 90 tablet 1  . Multiple Vitamins-Minerals (RA VISION-VITE PRESERVE PO) Take 2 capsules by mouth daily.      . nitroGLYCERIN (NITROLINGUAL) 0.4 MG/SPRAY spray Place 1 spray under the tongue every 5 (five) minutes as needed. 1 g 12  . potassium chloride SA (K-DUR,KLOR-CON) 20 MEQ tablet TAKE 1 TABLET BY MOUTH DAILY. 90 tablet 1  . pravastatin (PRAVACHOL) 40 MG tablet TAKE 1 TABLET DAILY 90 tablet 2  . traMADol (ULTRAM) 50 MG tablet Take 1 tablet (50 mg total) by mouth every 6 (six) hours as needed. 9 tablet 0   No Known Allergies   Exam:  BP 144/65 mmHg  Pulse 53  Temp(Src) 98.5 F (36.9 C) (Oral)  Resp 16  SpO2 96% Gen: Well NAD HEENT: EOMI,  MMM Lungs: Normal work of  breathing. CTABL Heart: RRR no MRG Abd: NABS, Soft. Nondistended, Nontender Exts: Brisk capillary refill, warm and well perfused.  Skin: Skin tear right ulnar forearm no deep laceration MSK: Ecchymosis swollen and tender right ischial tuberosity area. Hip motion is normal. Patient is able to ambulate normally.  No results found for this or any previous visit (from the past 24 hour(s)). Dg Hip Unilat With Pelvis 2-3 Views Right  07/03/2014   CLINICAL DATA:  Fall today with acute pelvic and right hip pain. Initial encounter.  EXAM: RIGHT HIP (WITH PELVIS) 2-3 VIEWS  COMPARISON:  None.  FINDINGS: There is no evidence of acute fracture, subluxation or dislocation.  Degenerative changes in both hips and lower lumbar spine identified.  Prostate seeds are present.  No focal bony lesions are identified.  IMPRESSION: No evidence of acute bony abnormality.  Degenerative changes in both hips and lower lumbar spine.   Electronically Signed   By: Margarette Canada M.D.   On: 07/03/2014 18:13    Assessment and Plan: 81 y.o. male with  1) skin tear right arm. Treat with routine wound management. Follow-up with PCP 2) contusion right ischial tuberosity. Conservative management. Tylenol or tramadol for pain. Follow-up with PCP.  Discussed warning signs or symptoms. Please see discharge instructions. Patient expresses understanding.     Gregor Hams, MD 07/03/14 [phone]

## 2014-07-05 ENCOUNTER — Inpatient Hospital Stay (HOSPITAL_COMMUNITY): Admission: RE | Admit: 2014-07-05 | Payer: Medicare Other | Source: Ambulatory Visit

## 2014-07-05 ENCOUNTER — Telehealth: Payer: Self-pay | Admitting: Family

## 2014-07-05 ENCOUNTER — Ambulatory Visit: Payer: Medicare Other | Admitting: Family

## 2014-07-05 NOTE — Telephone Encounter (Signed)
Spoke with pts wife to r.s

## 2014-07-05 NOTE — Telephone Encounter (Signed)
-----   Message from Denman George, RN sent at 07/05/2014  9:42 AM EDT ----- Regarding: needs appt. rescheduled Contact: [phone] Please contact pt's wife to reschedule today's appts. To a later date.  She reported he took a hard fall over the weekend and doesn't feel he can lay down for an hour ultrasound study.  This can be rescheduled to NP schedule on a Monday (VWB pt.)  Thank you.

## 2014-07-12 ENCOUNTER — Ambulatory Visit (INDEPENDENT_AMBULATORY_CARE_PROVIDER_SITE_OTHER): Payer: Medicare Other | Admitting: Family Medicine

## 2014-07-12 ENCOUNTER — Other Ambulatory Visit: Payer: Self-pay | Admitting: Cardiology

## 2014-07-12 ENCOUNTER — Encounter: Payer: Self-pay | Admitting: Family Medicine

## 2014-07-12 VITALS — BP 130/60 | HR 60 | Temp 98.2°F | Ht 69.0 in | Wt 196.8 lb

## 2014-07-12 DIAGNOSIS — T148 Other injury of unspecified body region: Secondary | ICD-10-CM

## 2014-07-12 DIAGNOSIS — T148XXA Other injury of unspecified body region, initial encounter: Secondary | ICD-10-CM

## 2014-07-12 NOTE — Progress Notes (Signed)
Pre visit review using our clinic review tool, if applicable. No additional management support is needed unless otherwise documented below in the visit note. 

## 2014-07-12 NOTE — Progress Notes (Signed)
Dr. Frederico Hamman T. Jaxton Casale, MD, [facility] [place], [address] Phone: [phone] Fax: [phone]  07/12/2014  Patient: Andrew [name], MRN: [id number], DOB: [birth year], 81 y.o.  Primary Physician:  [name], MD  Chief Complaint: Fall  Subjective:   Andrew [name] is a 82 y.o. very pleasant male patient who presents with the following:  Going into the garage and he has four steps. Normally uses cane and lost it about the third step and fell backwards. Busted a board. Ended up on the floor and fell down 4 steps onto his bottom. Did not hit his head at all.   Did not hit his head. Wiggled and did not think that broken anything.  Then went to sit down and could not really sit down. Like a knife sitting on the posterior.   Next day - has a balloon down there. Developed a hematoma. There is mild bruising on the posterior and some discoloration around testicle and penile area.   Past Medical History, Surgical History, Social History, Family History, Problem List, Medications, and Allergies have been reviewed and updated if relevant.  Patient Active Problem List   Diagnosis Date Noted  . Chronic diastolic CHF (congestive heart failure) 08/19/2012    Priority: High  . PVD- moderate carotid disease 03/07/2010    Priority: High  . Angina, class II 06/18/2008    Priority: High  . Hx of CABG 06/18/2008    Priority: High  . Moderate aortic stenosis 06/18/2008    Priority: High  . Macular degeneration (senile) of retina 04/29/2014    Priority: Medium  . TIA (transient ischemic attack) 06/03/2013    Priority: Medium  . COPD, minimal-mild 12/22/2010    Priority: Medium  . Lumbar spinal stenosis 07/13/2009    Priority: Medium  . Chronic renal insufficiency, stage III (moderate) 06/02/2014  . 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 Major depressive disorder, single episode 06/11/2013  . Pancreatic cyst 01/29/2013  . OSA  (obstructive sleep apnea) 12/26/2012  . S/P excision of acoustic neuroma 06/18/2010  . Pulmonary nodule 06/17/2010  . Vertigo 06/14/2010  . THYROID NODULE 03/13/2010  . ALLERGIC RHINITIS 09/01/2008  . COLONIC POLYPS, HX OF 09/01/2008  . HYPERCHOLESTEROLEMIA 06/18/2008  . GASTROESOPHAGEAL REFLUX DISEASE 06/18/2008    Past Medical History  Diagnosis Date  . HYPERCHOLESTEROLEMIA   . CAD, ARTERY BYPASS GRAFT   . CAROTID ARTERY STENOSIS 03/07/2010    80%  . AORTIC STENOSIS   . GASTROESOPHAGEAL REFLUX DISEASE   . Skin cancer   . History of colonic polyps     1999, 2004  . Allergic rhinitis   . Thyroid nodule 05/2010    Abnormal biopsy, 81 year old patient and his wife have made informed decision to not pursue surgical resection. Potential risk including cancer has been thoroughly discussed with the patient.  . S/P excision of acoustic neuroma   . History of prostate cancer 2002    s/p treatment with seeds / radiation  . Lumbar spinal stenosis 07/13/2009  . COPD, mild 12/22/2010  . Pancreatic cyst 01/29/2013    Noted on MRI scan from [facility] on 07/27/2011.  I reviewed report at patient request on 01-29-2013.  "Small cystic focus in the posterior pancreatic head.  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  Follow up MRI is advised. The main pancreatic duct remains normal in appearance."  Patient do  . CHF (congestive heart failure)   . Macular degeneration (senile) of retina 04/29/2014    Past Surgical History  Procedure Laterality Date  . Coronary artery bypass graft    . Transperineal implatation of palladium    . Cholecystectomy    . Shoulder surgery    . Total knee arthroplasty    . Cataract extraction    . Tonsillectomy and adenoidectomy  1932  . Craniectomy for excision of acoustic neuroma  1985  . Cataract extraction  2010  . Eye surgery      History   Social  History  . Marital Status: Married    Spouse Name: N/A  . Number of Children: N/A  . Years of Education: N/A   Occupational History  . retired  At [employer]    and [employer] Topics  . Smoking status: Former Smoker -- 2.00 packs/day for 20 years    Types: Cigarettes    Quit date: 01/22/1961  . Smokeless tobacco: Never Used  . Alcohol Use: 4.2 oz/week    7 Shots of liquor per week     Comment: 1 drink nightly  . Drug Use: No  . Sexual Activity: Not on file   Other Topics Concern  . Not on file   Social History Narrative    Family History  Problem Relation Age of Onset  . Emphysema Brother   . Lung cancer Brother   . Alcohol abuse    . Arthritis    . Cancer    . Macular degeneration    . Heart disease Father   . Hypertension Father   . Heart attack Father   . Colon cancer Sister   . Non-Hodgkin's lymphoma Sister   . Heart disease Brother   . Colon cancer Sister   . Skin cancer Sister   . Lung cancer Daughter     No Known Allergies  Medication list reviewed and updated in full in [facility].  GEN: No fevers, chills. Nontoxic. Primarily MSK c/o today. MSK: Detailed in the HPI GI: tolerating PO intake without difficulty Neuro: No numbness, parasthesias, or tingling associated. Otherwise the pertinent positives of the ROS are noted above.   Objective:   BP 130/60 mmHg  Pulse 60  Temp(Src) 98.2 F (36.8 C) (Oral)  Ht 5\' 9"  (1.753 m)  Wt 196 lb 12 oz (89.245 kg)  BMI 29.04 kg/m2   GEN: WDWN, NAD, Non-toxic, Alert & Oriented x 3 HEENT: Atraumatic, Normocephalic.  Ears and Nose: No external deformity. EXTR: No clubbing/cyanosis/edema NEURO: Normal gait.  PSYCH: Normally interactive. Conversant. Not depressed or anxious appearing.  Calm demeanor.    He is tender to palpation throughout inferiorly.  He is more tender around the Initial tuberosity bilaterally.  He also has some bruising posteriorly, and in the peroneal area.   Additionally there is some bruising around the scrotum and penis itself.  GU exam is normal with normal shaft and normal testicle exam.  Nontender along the spine and nontender about the hips as well bilaterally.  Radiology: Dg Hip Unilat With Pelvis 2-3 Views Right  07/03/2014   CLINICAL DATA:  Fall today with acute pelvic and right hip pain. Initial encounter.  EXAM: RIGHT HIP (WITH PELVIS) 2-3 VIEWS  COMPARISON:  None.  FINDINGS: There is no evidence of acute fracture, subluxation or dislocation.  Degenerative changes in both hips and lower lumbar spine identified.  Prostate seeds are present.  No focal  bony lesions are identified.  IMPRESSION: No evidence of acute bony abnormality.  Degenerative changes in both hips and lower lumbar spine.   Electronically Signed   By: Margarette Canada M.D.   On: 07/03/2014 18:13    Assessment and Plan:   Contusion of soft tissue  Bone bruise  This will probably take 3 or 4 weeks in total to heal.  Reassured him about bruising.  Almost 81 years old and taking aspirin.  Follow-up: prn  Signed,  Romari Gasparro T. Emory Gallentine, MD   Patient's Medications  New Prescriptions   No medications on file  Previous Medications   ALBUTEROL (PROVENTIL HFA;VENTOLIN HFA) 108 (90 BASE) MCG/ACT INHALER    Inhale 2 puffs into the lungs every 6 (six) hours as needed for wheezing or shortness of breath.   ASPIRIN 325 MG TABLET    Take 325 mg by mouth daily.   B COMPLEX-C-FOLIC ACID (NEPHRO-VITE PO)    Take 1 tablet by mouth daily.     CHOLECALCIFEROL (VITAMIN D) 400 UNITS TABS    Take 400 Units by mouth.   FLUOXETINE (PROZAC) 10 MG TABLET    TAKE 1 TABLET BY MOUTH DAILY.   FUROSEMIDE (LASIX) 40 MG TABLET    TAKE 1 TABLET BY MOUTH EVERY MORNING AND 1/2 TABEVERY EVENING   ISOSORBIDE MONONITRATE (IMDUR) 60 MG 24 HR TABLET    1 and 1/2 tablets (90mg ) daily   METOPROLOL SUCCINATE (TOPROL-XL) 25 MG 24 HR TABLET    TAKE 1 TABLET DAILY   MULTIPLE VITAMINS-MINERALS (RA VISION-VITE  PRESERVE PO)    Take 2 capsules by mouth daily.     NAPROXEN SODIUM (ANAPROX) 220 MG TABLET    Take 220 mg by mouth as needed.   POTASSIUM CHLORIDE SA (K-DUR,KLOR-CON) 20 MEQ TABLET    TAKE 1 TABLET BY MOUTH DAILY.   PRAVASTATIN (PRAVACHOL) 40 MG TABLET    TAKE 1 TABLET DAILY   TRAMADOL (ULTRAM) 50 MG TABLET    Take 1 tablet (50 mg total) by mouth every 6 (six) hours as needed.  Modified Medications   Modified Medication Previous Medication   NITROGLYCERIN (NITROLINGUAL) 0.4 MG/SPRAY SPRAY nitroGLYCERIN (NITROLINGUAL) 0.4 MG/SPRAY spray      PLACE 1 SPRAY UNDER THE TONGUE EVERY 5 (FIVE) MINUTES AS NEEDED.    Place 1 spray under the tongue every 5 (five) minutes as needed.  Discontinued Medications   No medications on file

## 2014-08-05 ENCOUNTER — Encounter: Payer: Self-pay | Admitting: Family

## 2014-08-06 ENCOUNTER — Ambulatory Visit (INDEPENDENT_AMBULATORY_CARE_PROVIDER_SITE_OTHER)
Admission: RE | Admit: 2014-08-06 | Discharge: 2014-08-06 | Disposition: A | Discharge status: Home / Self Care | Payer: Medicare Other | Source: Ambulatory Visit | Attending: Family Medicine | Admitting: Family Medicine

## 2014-08-06 ENCOUNTER — Encounter: Payer: Self-pay | Admitting: Family Medicine

## 2014-08-06 ENCOUNTER — Ambulatory Visit (INDEPENDENT_AMBULATORY_CARE_PROVIDER_SITE_OTHER): Payer: Medicare Other | Admitting: Family Medicine

## 2014-08-06 ENCOUNTER — Telehealth: Payer: Self-pay

## 2014-08-06 VITALS — BP 120/58 | HR 55 | Temp 97.6°F | Ht 69.0 in | Wt 192.0 lb

## 2014-08-06 DIAGNOSIS — M545 Low back pain, unspecified: Secondary | ICD-10-CM

## 2014-08-06 DIAGNOSIS — W19XXXA Unspecified fall, initial encounter: Secondary | ICD-10-CM | POA: Insufficient documentation

## 2014-08-06 DIAGNOSIS — W19XXXS Unspecified fall, sequela: Secondary | ICD-10-CM

## 2014-08-06 DIAGNOSIS — M25551 Pain in right hip: Secondary | ICD-10-CM

## 2014-08-06 DIAGNOSIS — Y92009 Unspecified place in unspecified non-institutional (private) residence as the place of occurrence of the external cause: Secondary | ICD-10-CM

## 2014-08-06 MED ORDER — TRAMADOL HCL 50 MG PO TABS: 25.0000 mg | ORAL_TABLET | Freq: Four times a day (QID) | ORAL | Status: DC | PRN

## 2014-08-06 NOTE — Telephone Encounter (Signed)
Pt left v/m at 4:58 pm requesting cb about xrays done earlier today; pt thought would get cb today with results.Please advise.

## 2014-08-06 NOTE — Patient Instructions (Signed)
We will call with X-ray results.  Use tyelnol for pain, can use tramadol for breakthorugh pain 1/2 to 1 tab.

## 2014-08-06 NOTE — Progress Notes (Signed)
Pre visit review using our clinic review tool, if applicable. No additional management support is needed unless otherwise documented below in the visit note. 

## 2014-08-06 NOTE — Progress Notes (Signed)
   Subjective:    Patient ID: Andrew [name], male    DOB: [birth year], 81 y.o.   MRN: [id number]  HPI   81 year old elderly male pt  with history of lumbar spinal stenosis of Dr. Lillie Fragmin presents with  acute onset pain this AM after bending over washing legs. 8-9/10 on pain scale. Pain is constant,  worse with movement.  Right lower back and over right upper pelvic bone.  Tried heating pad, no relief.  Cannot get comfortable. Tried aleve for pain, helped some.  No new numbness, no weakness.  Had bad fall 07/03/2014. Seen at urgent care, Dr. Georgina Snell. Felt backward down 3 steps.Hip pelvis X-ray.. No fracture. Contusion/hematoma improved, improving pain until today.   Hx of  Spinal stenosis lumbar. Has had injections in low back with Dr. Ernestina Patches in past. LAst one 5-6 months ago. Has kidney issues.     Review of Systems  Constitutional: Negative for fever and fatigue.  HENT: Negative for ear pain.   Eyes: Negative for pain.  Respiratory: Negative for shortness of breath.   Cardiovascular: Negative for chest pain.       Objective:   Physical Exam  Constitutional: Vital signs are normal. He appears well-developed and well-nourished.  Elderly male in mild distress  HENT:  Head: Normocephalic.  Right Ear: Hearing normal.  Left Ear: Hearing normal.  Nose: Nose normal.  Mouth/Throat: Oropharynx is clear and moist and mucous membranes are normal.  Neck: Trachea normal. Carotid bruit is not present. No thyroid mass and no thyromegaly present.  Cardiovascular: Normal rate, regular rhythm and normal pulses.  Exam reveals no gallop, no distant heart sounds and no friction rub.   No murmur heard. No peripheral edema  Pulmonary/Chest: Effort normal and breath sounds normal. No respiratory distress.  Musculoskeletal:       Right hip: He exhibits decreased range of motion, tenderness and bony tenderness. He exhibits normal strength.       Lumbar back: He exhibits decreased range of  motion, tenderness and bony tenderness.       Legs: Neg SLR, neg faber's  Neurological: He has normal strength. No sensory deficit. He exhibits normal muscle tone. Gait abnormal.  Skin: Skin is warm, dry and intact. No rash noted.  Psychiatric: He has a normal mood and affect. His speech is normal and behavior is normal. Thought content normal.          Assessment & Plan:   Given age and recent fall on 07/03/2014.Marland Kitchen eval with repeat X-ray to eval for fracture. If no fracture, likely secondary to flare of spinal stenosis, arthtrits and muscle spasm.  NSAIDs contraindicated. Can use tylenol with tramadol for breakthrough pain.  Treat with heat and gentle stretches.

## 2014-08-07 ENCOUNTER — Encounter: Payer: Self-pay | Admitting: Family Medicine

## 2014-08-09 ENCOUNTER — Encounter: Payer: Self-pay | Admitting: Family

## 2014-08-09 ENCOUNTER — Ambulatory Visit (INDEPENDENT_AMBULATORY_CARE_PROVIDER_SITE_OTHER): Payer: Medicare Other | Admitting: Family

## 2014-08-09 ENCOUNTER — Other Ambulatory Visit: Payer: Self-pay | Admitting: Family

## 2014-08-09 ENCOUNTER — Ambulatory Visit (HOSPITAL_COMMUNITY)
Admission: RE | Admit: 2014-08-09 | Discharge: 2014-08-09 | Disposition: A | Discharge status: Home / Self Care | Payer: Medicare Other | Source: Ambulatory Visit | Attending: Family | Admitting: Family

## 2014-08-09 VITALS — BP 130/60 | HR 50 | Temp 97.6°F | Resp 14 | Ht 69.0 in | Wt 194.0 lb

## 2014-08-09 DIAGNOSIS — I6523 Occlusion and stenosis of bilateral carotid arteries: Secondary | ICD-10-CM | POA: Insufficient documentation

## 2014-08-09 NOTE — Patient Instructions (Signed)
Stroke Prevention Some medical conditions and behaviors are associated with an increased chance of having a stroke. You may prevent a stroke by making healthy choices and managing medical conditions. HOW CAN I REDUCE MY RISK OF HAVING A STROKE?   Stay physically active. Get at least 30 minutes of activity on most or all days.  Do not smoke. It may also be helpful to avoid exposure to secondhand smoke.  Limit alcohol use. Moderate alcohol use is considered to be:  No more than 2 drinks per day for men.  No more than 1 drink per day for nonpregnant women.  Eat healthy foods. This involves:  Eating 5 or more servings of fruits and vegetables a day.  Making dietary changes that address high blood pressure (hypertension), high cholesterol, diabetes, or obesity.  Manage your cholesterol levels.  Making food choices that are high in fiber and low in saturated fat, trans fat, and cholesterol may control cholesterol levels.  Take any prescribed medicines to control cholesterol as directed by your health care provider.  Manage your diabetes.  Controlling your carbohydrate and sugar intake is recommended to manage diabetes.  Take any prescribed medicines to control diabetes as directed by your health care provider.  Control your hypertension.  Making food choices that are low in salt (sodium), saturated fat, trans fat, and cholesterol is recommended to manage hypertension.  Take any prescribed medicines to control hypertension as directed by your health care provider.  Maintain a healthy weight.  Reducing calorie intake and making food choices that are low in sodium, saturated fat, trans fat, and cholesterol are recommended to manage weight.  Stop drug abuse.  Avoid taking birth control pills.  Talk to your health care provider about the risks of taking birth control pills if you are over 35 years old, smoke, get migraines, or have ever had a blood clot.  Get evaluated for sleep  disorders (sleep apnea).  Talk to your health care provider about getting a sleep evaluation if you snore a lot or have excessive sleepiness.  Take medicines only as directed by your health care provider.  For some people, aspirin or blood thinners (anticoagulants) are helpful in reducing the risk of forming abnormal blood clots that can lead to stroke. If you have the irregular heart rhythm of atrial fibrillation, you should be on a blood thinner unless there is a good reason you cannot take them.  Understand all your medicine instructions.  Make sure that other conditions (such as anemia or atherosclerosis) are addressed. SEEK IMMEDIATE MEDICAL CARE IF:   You have sudden weakness or numbness of the face, arm, or leg, especially on one side of the body.  Your face or eyelid droops to one side.  You have sudden confusion.  You have trouble speaking (aphasia) or understanding.  You have sudden trouble seeing in one or both eyes.  You have sudden trouble walking.  You have dizziness.  You have a loss of balance or coordination.  You have a sudden, severe headache with no known cause.  You have new chest pain or an irregular heartbeat. Any of these symptoms may represent a serious problem that is an emergency. Do not wait to see if the symptoms will go away. Get medical help at once. Call your local emergency services (911 in [place] Do not drive yourself to the hospital. Document Released: 02/16/2004 Document Revised: 05/25/2013 Document Reviewed: 07/11/2012 ExitCare Patient Information 2015 [employer]. This information is not intended to replace advice given   to you by your health care provider. Make sure you discuss any questions you have with your health care provider.  

## 2014-08-09 NOTE — Progress Notes (Signed)
Established Carotid Patient   History of Present Illness  Andrew [name] is a 81 y.o. male patient of Dr. Trula [name] who returns for follow up of known carotid stenosis.  Patient has not had previous carotid artery intervention.  The patient denies any history of TIA or stroke symptoms, specifically the patient denies a history of amaurosis fugax or monocular blindness, denies a history unilateral of facial drooping, denies a history of hemiplegia, and denies a history of receptive or expressive aphasia.   He did have an episode of global amnesia, states Dr. Erling [name] assured him this was not a stroke or TIA.  The patient reports New Medical or Surgical History: fell backwards on stairs June 2016, was evaluated at an urgent care center. Wife states he has confirmed vertebral fracture of L3. Since this fall his right leg occasionally "feels like rubber".  Pt states he has spinal stenosis and receives injections in his back to help this, he does not seem to have claudication symptoms, denies non healing wounds. He avoids activity that brings on chest pain and has not had to use NTG as much. Dr. Aundra [name] is his cardiologist. He "gets winded pretty quick".  He has been using a recumbent bike daily.   Pt Diabetic: No Pt smoker: former smoker, quit in 1963  Pt meds include: Statin : Yes ASA: Yes Other anticoagulants/antiplatelets: no  Past Medical History  Diagnosis Date  . HYPERCHOLESTEROLEMIA   . CAD, ARTERY BYPASS GRAFT   . CAROTID ARTERY STENOSIS 03/07/2010    80%  . AORTIC STENOSIS   . GASTROESOPHAGEAL REFLUX DISEASE   . Skin cancer   . History of colonic polyps     1999, 2004  . Allergic rhinitis   . Thyroid nodule 05/2010    Abnormal biopsy, 81 year old patient and his wife have made informed decision to not pursue surgical resection. Potential risk including cancer has been thoroughly discussed with the patient.  . S/P excision of acoustic neuroma   . History of prostate cancer 2002     s/p treatment with seeds / radiation  . Lumbar spinal stenosis 07/13/2009  . COPD, mild 12/22/2010  . Pancreatic cyst 01/29/2013    Noted on MRI scan from [facility] on 07/27/2011.  I reviewed report at patient request on 01-29-2013.  "Small cystic focus in the posterior pancreatic head.  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  Follow up MRI is advised. The main pancreatic duct remains normal in appearance."  Patient do  . CHF (congestive heart failure)   . Macular degeneration (senile) of retina 04/29/2014    Social History History  Substance Use Topics  . Smoking status: Former Smoker -- 2.00 packs/day for 20 years    Types: Cigarettes    Quit date: 01/22/1961  . Smokeless tobacco: Never Used  . Alcohol Use: 4.2 oz/week    7 Shots of liquor Andrew week     Comment: 1 drink nightly    Family History Family History  Problem Relation Age of Onset  . Emphysema Brother   . Lung cancer Brother   . Alcohol abuse    . Arthritis    . Cancer    . Macular degeneration    . Heart disease Father   . Hypertension Father   . Heart attack Father   . Colon cancer Sister   . Non-Hodgkin's lymphoma Sister   . Heart disease Brother   .  Colon cancer Sister   . Skin cancer Sister   . Lung cancer Daughter     Surgical History Past Surgical History  Procedure Laterality Date  . Coronary artery bypass graft    . Transperineal implatation of palladium    . Cholecystectomy    . Shoulder surgery    . Total knee arthroplasty    . Cataract extraction    . Tonsillectomy and adenoidectomy  1932  . Craniectomy for excision of acoustic neuroma  1985  . Cataract extraction  2010  . Eye surgery      No Known Allergies  Current Outpatient Prescriptions  Medication Sig Dispense Refill  . albuterol (PROVENTIL HFA;VENTOLIN HFA) 108 (90 BASE) MCG/ACT inhaler Inhale 2 puffs into the lungs  every 6 (six) hours as needed for wheezing or shortness of breath. 1 Inhaler 3  . aspirin 325 MG tablet Take 325 mg by mouth daily.    . B Complex-C-Folic Acid (NEPHRO-VITE PO) Take 1 tablet by mouth daily.      . cholecalciferol (VITAMIN D) 400 UNITS TABS Take 400 Units by mouth.    [name] 10 MG tablet TAKE 1 TABLET BY MOUTH DAILY. 90 tablet 1  . furosemide (LASIX) 40 MG tablet TAKE 1 TABLET BY MOUTH EVERY MORNING AND 1/2 TABEVERY EVENING 135 tablet 3  . isosorbide mononitrate (IMDUR) 60 MG 24 hr tablet 1 and 1/2 tablets (90mg ) daily 135 tablet 3  . metoprolol succinate (TOPROL-XL) 25 MG 24 hr tablet TAKE 1 TABLET DAILY 90 tablet 1  . Multiple Vitamins-Minerals (RA VISION-VITE PRESERVE PO) Take 2 capsules by mouth daily.      . naproxen sodium (ANAPROX) 220 MG tablet Take 220 mg by mouth as needed.    . nitroGLYCERIN (NITROLINGUAL) 0.4 MG/SPRAY spray PLACE 1 SPRAY UNDER THE TONGUE EVERY 5 (FIVE) MINUTES AS NEEDED. 4.9 g 12  . potassium chloride SA (K-DUR,KLOR-CON) 20 MEQ tablet TAKE 1 TABLET BY MOUTH DAILY. 90 tablet 1  . pravastatin (PRAVACHOL) 40 MG tablet TAKE 1 TABLET DAILY 90 tablet 2  . traMADol (ULTRAM) 50 MG tablet Take 1 tablet (50 mg total) by mouth every 6 (six) hours as needed. 9 tablet 0  . traMADol (ULTRAM) 50 MG tablet Take 0.5-1 tablets (25-50 mg total) by mouth every 6 (six) hours as needed. 30 tablet 0   No current facility-administered medications for this visit.    Review of Systems : See HPI for pertinent positives and negatives.  Physical Examination  Filed Vitals:   08/09/14 1119 08/09/14 1122  BP: 121/61 130/60  Pulse: 48 50  Temp:  97.6 F (36.4 C)  TempSrc:  Oral  Resp:  14  Height:  5\' 9"  (1.753 m)  Weight:  194 lb (87.998 kg)  SpO2:  98%   Body mass index is 28.64 kg/(m^2).  General: WDWN male in NAD GAIT: normal Eyes: PERRLA Pulmonary: Non-labored, CTAB, Negative Rales, Negative rhonchi, & Negative wheezing.  Cardiac: regular  Rhythm,+ low grade murmur.  VASCULAR EXAM Carotid Bruits Right Left   positive Positive   Aorta is not palpable. Radial pulses are 2+ palpable and equal.      LE Pulses Right Left   POPLITEAL not palpable  not palpable   POSTERIOR TIBIAL not palpable  not palpable    DORSALIS PEDIS  ANTERIOR TIBIAL not palpable  not palpable     Gastrointestinal: soft, nontender, BS WNL, no r/g,no palpable masses.  Musculoskeletal: Negative muscle atrophy/wasting. M/S 5/5 throughout, Extremities without ischemic  changes.  Neurologic: A&O X 3; Appropriate Affect, Speech is normal CN 2-12 intact except is hard of hearing, Pain and light touch intact in extremities, Motor exam as listed above.          Non-Invasive Vascular Imaging CAROTID DUPLEX 08/09/2014   CEREBROVASCULAR DUPLEX EVALUATION    INDICATION: Carotid stenosis    PREVIOUS INTERVENTION(S): NA    DUPLEX EXAM:     RIGHT  LEFT  Peak Systolic Velocities (cm/s) End Diastolic Velocities (cm/s) Plaque LOCATION Peak Systolic Velocities (cm/s) End Diastolic Velocities (cm/s) Plaque  69 9  CCA PROXIMAL 105 0   74 10  CCA MID 66 11   72 14 HT CCA DISTAL 65 8 HT  119 0 HT ECA 115 0 HT  125 29 HT ICA PROXIMAL 312 70 HT  90 17  ICA MID 67 15   63 11  ICA DISTAL 94 19     1.67 ICA / CCA Ratio (PSV) 4.73  Antegrade Vertebral Flow Antegrade  757 Brachial Systolic Pressure (mmHg) 972  Triphasic Brachial Artery Waveforms Triphasic    Plaque Morphology:  HM = Homogeneous, HT = Heterogeneous, CP = Calcific Plaque, SP = Smooth Plaque, IP = Irregular Plaque  ADDITIONAL FINDINGS: Right subclavian artery PSV 114cm/sec; Left subclavian artery PSV176cm/sec    IMPRESSION: Right internal carotid artery stenosis present in the less than 40% range. Left internal carotid  artery stenosis present in the 60%-79% range.    Compared to the previous exam:  Classification downgraded on the right and stable on the left since study on 12/28/2013.      Assessment: Andrew [name] is a 81 y.o. male who has no history of stroke or TIA bu does have CAD. Today's carotid Duplex suggests less than 40% right ICA stenosis and 60%-79% left ICA stenosis.  Classification improved on the right and stable on the left since study on 12/28/2013.   Plan: Follow-up in 6 months with Carotid Duplex.   I discussed in depth with the patient the nature of atherosclerosis, and emphasized the importance of maximal medical management including strict control of blood pressure, blood glucose, and lipid levels, obtaining regular exercise, and continued cessation of smoking.  The patient is aware that without maximal medical management the underlying atherosclerotic disease process will progress, limiting the benefit of any interventions. The patient was given information about stroke prevention and what symptoms should prompt the patient to seek immediate medical care. Thank you for allowing [place] to participate in this patient's care.  Clemon Chambers, RN, MSN, FNP-C [facility] Office: [facility] Physician: Andrew [name]  08/09/2014 11:32 AM

## 2014-08-09 NOTE — Addendum Note (Signed)
Addended by: Dorthula Rue L on: 08/09/2014 02:15 PM   Modules accepted: Orders

## 2014-09-09 ENCOUNTER — Ambulatory Visit (INDEPENDENT_AMBULATORY_CARE_PROVIDER_SITE_OTHER): Payer: Medicare Other | Admitting: Family Medicine

## 2014-09-09 ENCOUNTER — Ambulatory Visit (INDEPENDENT_AMBULATORY_CARE_PROVIDER_SITE_OTHER)
Admission: RE | Admit: 2014-09-09 | Discharge: 2014-09-09 | Disposition: A | Discharge status: Home / Self Care | Payer: Medicare Other | Source: Ambulatory Visit | Attending: Family Medicine | Admitting: Family Medicine

## 2014-09-09 ENCOUNTER — Encounter: Payer: Self-pay | Admitting: Family Medicine

## 2014-09-09 VITALS — BP 112/80 | HR 54 | Temp 98.7°F | Ht 69.0 in | Wt 192.0 lb

## 2014-09-09 DIAGNOSIS — M5136 Other intervertebral disc degeneration, lumbar region: Secondary | ICD-10-CM

## 2014-09-09 DIAGNOSIS — M545 Low back pain, unspecified: Secondary | ICD-10-CM

## 2014-09-09 DIAGNOSIS — M4806 Spinal stenosis, lumbar region: Secondary | ICD-10-CM | POA: Diagnosis not present

## 2014-09-09 DIAGNOSIS — H3530 Unspecified macular degeneration: Secondary | ICD-10-CM | POA: Diagnosis not present

## 2014-09-09 DIAGNOSIS — M48061 Spinal stenosis, lumbar region without neurogenic claudication: Secondary | ICD-10-CM

## 2014-09-09 NOTE — Progress Notes (Signed)
Dr. Frederico Hamman T. Karron Alvizo, MD, [facility] [place], [address] Phone: [phone] Fax: [phone]  09/09/2014  Patient: Andrew [name], MRN: [id number], DOB: [birth year], 81 y.o.  Primary Physician:  [name], MD  Chief Complaint: Fall and Back Pain  Subjective:   Andrew [name] is a 81 y.o. very pleasant male patient who presents with the following:  Yesterday was a bad day, and front to the back was about 60-70 feet. Had to stop in the living room, and felt like hips might not hold up his body. Today it is better some. Pleasant gentleman, he is been having intermittently back pain that waxes and waning to from being not all that bad to being quite severe.  He has had 2 falls in the last month.  No head trauma.  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  He has been working quite hard on some of his balance exercises and leg strengthening.  [place] last week again.  Fallen 2 times in the last month.    Past Medical History, Surgical History, Social History, Family History, Problem List, Medications, and Allergies have been reviewed and updated if relevant.  Patient Active Problem List   Diagnosis Date Noted  . Chronic diastolic CHF (congestive heart failure) 08/19/2012    Priority: High  . PVD- moderate carotid disease 03/07/2010    Priority: High  . Angina, class II 06/18/2008    Priority: High  . Hx of CABG 06/18/2008    Priority: High  . Moderate aortic stenosis 06/18/2008    Priority: High  . Macular degeneration (senile) of retina 04/29/2014    Priority: Medium  . TIA (transient ischemic attack) 06/03/2013    Priority: Medium  . COPD, minimal-mild 12/22/2010    Priority: Medium  . Lumbar spinal stenosis 07/13/2009    Priority: Medium  . Fall at home 08/06/2014  . Chronic renal insufficiency, stage III (moderate) 06/02/2014  . 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 Major depressive disorder, single episode  06/11/2013  . Pancreatic cyst 01/29/2013  . OSA (obstructive sleep apnea) 12/26/2012  . S/P excision of acoustic neuroma 06/18/2010  . Pulmonary nodule 06/17/2010  . Vertigo 06/14/2010  . THYROID NODULE 03/13/2010  . ALLERGIC RHINITIS 09/01/2008  . COLONIC POLYPS, HX OF 09/01/2008  . HYPERCHOLESTEROLEMIA 06/18/2008  . GASTROESOPHAGEAL REFLUX DISEASE 06/18/2008    Past Medical History  Diagnosis Date  . HYPERCHOLESTEROLEMIA   . CAD, ARTERY BYPASS GRAFT   . CAROTID ARTERY STENOSIS 03/07/2010    80%  . AORTIC STENOSIS   . GASTROESOPHAGEAL REFLUX DISEASE   . Skin cancer   . History of colonic polyps     1999, 2004  . Allergic rhinitis   . Thyroid nodule 05/2010    Abnormal biopsy, 81 year old patient and his wife have made informed decision to not pursue surgical resection. Potential risk including cancer has been thoroughly discussed with the patient.  . S/P excision of acoustic neuroma   . History of prostate cancer 2002    s/p treatment with seeds / radiation  . Lumbar spinal stenosis 07/13/2009  . COPD, mild 12/22/2010  . Pancreatic cyst 01/29/2013    Noted on MRI scan from [facility] on 07/27/2011.  I reviewed report at patient request on 01-29-2013.  "Small cystic focus in the posterior pancreatic head.  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  Follow up MRI is advised. The main pancreatic duct remains normal in appearance."  Patient do  . CHF (congestive heart failure)   . Macular degeneration (senile) of retina 04/29/2014    Past Surgical History  Procedure Laterality Date  . Coronary artery bypass graft    . Transperineal implatation of palladium    . Cholecystectomy    . Shoulder surgery    . Total knee arthroplasty    . Cataract extraction    . Tonsillectomy and adenoidectomy  1932  . Craniectomy for excision of acoustic neuroma  1985  . Cataract extraction  2010   . Eye surgery      Social History   Social History  . Marital Status: Married    Spouse Name: N/A  . Number of Children: N/A  . Years of Education: N/A   Occupational History  . retired  At [employer]    and [employer] Topics  . Smoking status: Former Smoker -- 2.00 packs/day for 20 years    Types: Cigarettes    Quit date: 01/22/1961  . Smokeless tobacco: Never Used  . Alcohol Use: 4.2 oz/week    7 Shots of liquor per week     Comment: 1 drink nightly  . Drug Use: No  . Sexual Activity: Not on file   Other Topics Concern  . Not on file   Social History Narrative    Family History  Problem Relation Age of Onset  . Emphysema Brother   . Lung cancer Brother   . Alcohol abuse    . Arthritis    . Cancer    . Macular degeneration    . Heart disease Father   . Hypertension Father   . Heart attack Father   . Colon cancer Sister   . Non-Hodgkin's lymphoma Sister   . Heart disease Brother   . Colon cancer Sister   . Skin cancer Sister   . Lung cancer Daughter     No Known Allergies  Medication list reviewed and updated in full in [place].  GEN: No fevers, chills. Nontoxic. Primarily MSK c/o today. MSK: Detailed in the HPI GI: tolerating PO intake without difficulty Neuro: No numbness, parasthesias, or tingling associated. Otherwise the pertinent positives of the ROS are noted above.   Objective:   BP 112/80 mmHg  Pulse 54  Temp(Src) 98.7 F (37.1 C) (Oral)  Ht 5\' 9"  (1.753 m)  Wt 192 lb (87.091 kg)  BMI 28.34 kg/m2   GEN: WDWN, NAD, Non-toxic, Alert & Oriented x 3 HEENT: Atraumatic, Normocephalic.  Ears and Nose: No external deformity. EXTR: No clubbing/cyanosis/edema NEURO: Normal gait. Using cane PSYCH: Normally interactive. Conversant. Not depressed or anxious appearing.  Calm demeanor.    Modest tenderness approximately at L3.  Modest tenderness paraspinous musculature.  No tenderness in the SI joints.  No  lateral hip tenderness.  Hip flexion, abduction, abduction, knee extension and flexion are all 5/5.  Radiology: Dg Lumbar Spine Complete  09/09/2014   CLINICAL DATA:  Acute low back pain since a fall last week.  EXAM: LUMBAR SPINE - COMPLETE 4+ VIEW  COMPARISON:  08/06/2014  FINDINGS: There is no acute fracture or bone destruction. Prominent Schmorl's nodes in the superior endplates of L3 and L4, stable. Degenerative disc disease throughout the lumbar spine, most severe at L2-3 and L4-5. No spondylolisthesis or spondylolysis. Extensive calcifications in the abdominal aorta and iliac arteries. Radiotherapy seeds in the  prostate gland.  IMPRESSION: No acute abnormalities. Multilevel degenerative disc and joint disease.   Electronically Signed   By: Lorriane Shire M.D.   On: 09/09/2014 13:43     Assessment and Plan:   DDD (degenerative disc disease), lumbar  Acute low back pain - Plan: DG Lumbar Spine Complete  Lumbar spinal stenosis  Macular degeneration (senile) of retina  >25 minutes spent in face to face time with patient, >50% spent in counselling or coordination of care: the patient certainly does not have a normal spine.  Given his significant macular degeneration, 2 recent falls, recommended that he start using his walker at home around the house.  No acute changes are seen on his plain films.  Extensive degenerative changes.  This may have all been a small piece of disc that is intermittently touch some nerve fiber.  Today he actually feels quite a bit better. Avoid anything sedating.  Orders Placed This Encounter  Procedures  . DG Lumbar Spine Complete    Signed,  Aarik Blank T. Daquawn Seelman, MD   Patient's Medications  New Prescriptions   No medications on file  Previous Medications   ALBUTEROL (PROVENTIL HFA;VENTOLIN HFA) 108 (90 BASE) MCG/ACT INHALER    Inhale 2 puffs into the lungs every 6 (six) hours as needed for wheezing or shortness of breath.   ASPIRIN 325 MG TABLET    Take  325 mg by mouth daily.   B COMPLEX-C-FOLIC ACID (NEPHRO-VITE PO)    Take 1 tablet by mouth daily.     CHOLECALCIFEROL (VITAMIN D) 400 UNITS TABS    Take 800 Units by mouth.    FLUOXETINE (PROZAC) 10 MG TABLET    TAKE 1 TABLET BY MOUTH DAILY.   FUROSEMIDE (LASIX) 40 MG TABLET    TAKE 1 TABLET BY MOUTH EVERY MORNING AND 1/2 TABEVERY EVENING   ISOSORBIDE MONONITRATE (IMDUR) 60 MG 24 HR TABLET    1 and 1/2 tablets (90mg ) daily   METOPROLOL SUCCINATE (TOPROL-XL) 25 MG 24 HR TABLET    TAKE 1 TABLET DAILY   MULTIPLE VITAMINS-MINERALS (RA VISION-VITE PRESERVE PO)    Take 2 capsules by mouth daily.     NAPROXEN SODIUM (ANAPROX) 220 MG TABLET    Take 220 mg by mouth as needed.   NITROGLYCERIN (NITROLINGUAL) 0.4 MG/SPRAY SPRAY    PLACE 1 SPRAY UNDER THE TONGUE EVERY 5 (FIVE) MINUTES AS NEEDED.   POTASSIUM CHLORIDE SA (K-DUR,KLOR-CON) 20 MEQ TABLET    TAKE 1 TABLET BY MOUTH DAILY.   PRAVASTATIN (PRAVACHOL) 40 MG TABLET    TAKE 1 TABLET DAILY   TRAMADOL (ULTRAM) 50 MG TABLET    Take 0.5-1 tablets (25-50 mg total) by mouth every 6 (six) hours as needed.  Modified Medications   No medications on file  Discontinued Medications   TRAMADOL (ULTRAM) 50 MG TABLET    Take 1 tablet (50 mg total) by mouth every 6 (six) hours as needed.

## 2014-09-09 NOTE — Progress Notes (Signed)
Pre visit review using our clinic review tool, if applicable. No additional management support is needed unless otherwise documented below in the visit note. 

## 2014-10-09 ENCOUNTER — Other Ambulatory Visit: Payer: Self-pay | Admitting: Cardiology

## 2014-10-14 ENCOUNTER — Other Ambulatory Visit: Payer: Self-pay | Admitting: Cardiology

## 2014-10-27 ENCOUNTER — Emergency Department (HOSPITAL_COMMUNITY)
Admission: EM | Admit: 2014-10-27 | Discharge: 2014-10-27 | Disposition: A | Discharge status: Home / Self Care | Payer: Medicare Other | Source: Home / Self Care | Attending: Family Medicine | Admitting: Family Medicine

## 2014-10-27 ENCOUNTER — Encounter (HOSPITAL_COMMUNITY): Payer: Self-pay | Admitting: *Deleted

## 2014-10-27 ENCOUNTER — Emergency Department (INDEPENDENT_AMBULATORY_CARE_PROVIDER_SITE_OTHER): Payer: Medicare Other

## 2014-10-27 DIAGNOSIS — S20212A Contusion of left front wall of thorax, initial encounter: Secondary | ICD-10-CM | POA: Diagnosis not present

## 2014-10-27 MED ORDER — HYDROCODONE-ACETAMINOPHEN 5-325 MG PO TABS: 1.0000 | ORAL_TABLET | Freq: Four times a day (QID) | ORAL | Status: DC | PRN

## 2014-10-27 NOTE — ED Notes (Signed)
Pt    Fell  topday  About  1  Hr  Ago  And  Injured his l  Rib cage  Area     He  Has  Pain on palpation            And  When he  Moves  In  Certain positions

## 2014-10-27 NOTE — ED Provider Notes (Signed)
CSN: [id number]     Arrival date & time 10/27/14  1707 History   First MD Initiated Contact with Patient 10/27/14 1826     Chief Complaint  Patient presents with  . Fall   (Consider location/radiation/quality/duration/timing/severity/associated sxs/prior Treatment) Patient is a 81 y.o. male presenting with fall. The history is provided by the patient and the spouse.  Fall This is a new problem. The current episode started 1 to 2 hours ago (fell playing with a neighbor dog and landed on left chest, c/o sharp pain.). The problem has not changed since onset.Associated symptoms include chest pain. Pertinent negatives include no headaches and no shortness of breath.    Past Medical History  Diagnosis Date  . HYPERCHOLESTEROLEMIA   . CAD, ARTERY BYPASS GRAFT   . CAROTID ARTERY STENOSIS 03/07/2010    80%  . AORTIC STENOSIS   . GASTROESOPHAGEAL REFLUX DISEASE   . Skin cancer   . History of colonic polyps     1999, 2004  . Allergic rhinitis   . Thyroid nodule 05/2010    Abnormal biopsy, 81 year old patient and his wife have made informed decision to not pursue surgical resection. Potential risk including cancer has been thoroughly discussed with the patient.  . S/P excision of acoustic neuroma   . History of prostate cancer 2002    s/p treatment with seeds / radiation  . Lumbar spinal stenosis 07/13/2009  . COPD, mild ([place]) 12/22/2010  . Pancreatic cyst 01/29/2013    Noted on MRI scan from [facility] on 07/27/2011.  I reviewed report at patient request on 01-29-2013.  "Small cystic focus in the posterior pancreatic head.  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  Follow up MRI is advised. The main pancreatic duct remains normal in appearance."  Patient do  . CHF (congestive heart failure) ([place])   . Macular degeneration (senile) of retina 04/29/2014   Past Surgical History  Procedure  Laterality Date  . Coronary artery bypass graft    . Transperineal implatation of palladium    . Cholecystectomy    . Shoulder surgery    . Total knee arthroplasty    . Cataract extraction    . Tonsillectomy and adenoidectomy  1932  . Craniectomy for excision of acoustic neuroma  1985  . Cataract extraction  2010  . Eye surgery     Family History  Problem Relation Age of Onset  . Emphysema Brother   . Lung cancer Brother   . Alcohol abuse    . Arthritis    . Cancer    . Macular degeneration    . Heart disease Father   . Hypertension Father   . Heart attack Father   . Colon cancer Sister   . Non-Hodgkin's lymphoma Sister   . Heart disease Brother   . Colon cancer Sister   . Skin cancer Sister   . Lung cancer Daughter    Social History  Substance Use Topics  . Smoking status: Former Smoker -- 2.00 packs/day for 20 years    Types: Cigarettes    Quit date: 01/22/1961  . Smokeless tobacco: Never Used  . Alcohol Use: 4.2 oz/week    7 Shots of liquor per week     Comment: 1 drink nightly    Review of Systems  Constitutional: Negative.   Respiratory: Negative for cough, shortness of breath and stridor.   Cardiovascular: Positive for chest  pain.  Gastrointestinal: Negative.   Neurological: Negative.  Negative for dizziness, light-headedness and headaches.  All other systems reviewed and are negative.   Allergies  Review of patient's allergies indicates no known allergies.  Home Medications   Prior to Admission medications   Medication Sig Start Date End Date Taking? Authorizing Provider  albuterol (PROVENTIL HFA;VENTOLIN HFA) 108 (90 BASE) MCG/ACT inhaler Inhale 2 puffs into the lungs every 6 (six) hours as needed for wheezing or shortness of breath. 05/30/13   Orma Flaming, MD  aspirin 325 MG tablet Take 325 mg by mouth daily.    Historical Provider, MD  B Complex-C-Folic Acid (NEPHRO-VITE PO) Take 1 tablet by mouth daily.      Historical Provider, MD   cholecalciferol (VITAMIN D) 400 UNITS TABS Take 800 Units by mouth.     Historical Provider, MD  FLUoxetine (PROZAC) 10 MG tablet TAKE 1 TABLET BY MOUTH DAILY. 05/27/14   [name], MD  furosemide (LASIX) 40 MG tablet TAKE 1 TABLET BY MOUTH EVERY MORNING AND 1/2 TABEVERY EVENING 01/20/14   Larey Dresser, MD  HYDROcodone-acetaminophen (NORCO/VICODIN) 5-325 MG tablet Take 1 tablet by mouth every 6 (six) hours as needed. 10/27/14   Billy Fischer, MD  isosorbide mononitrate (IMDUR) 60 MG 24 hr tablet 1 and 1/2 tablets (90mg ) daily 10/15/13   Larey Dresser, MD  metoprolol succinate (TOPROL-XL) 25 MG 24 hr tablet TAKE 1 TABLET DAILY 10/11/14   Larey Dresser, MD  Multiple Vitamins-Minerals (RA VISION-VITE PRESERVE PO) Take 2 capsules by mouth daily.      Historical Provider, MD  naproxen sodium (ANAPROX) 220 MG tablet Take 220 mg by mouth as needed.    Historical Provider, MD  nitroGLYCERIN (NITROLINGUAL) 0.4 MG/SPRAY spray PLACE 1 SPRAY UNDER THE TONGUE EVERY 5 (FIVE) MINUTES AS NEEDED. 07/13/14   Larey Dresser, MD  potassium chloride SA (K-DUR,KLOR-CON) 20 MEQ tablet TAKE 1 TABLET BY MOUTH DAILY. 10/14/14   Larey Dresser, MD  pravastatin (PRAVACHOL) 40 MG tablet TAKE 1 TABLET DAILY 03/02/14   Larey Dresser, MD  traMADol (ULTRAM) 50 MG tablet Take 0.5-1 tablets (25-50 mg total) by mouth every 6 (six) hours as needed. 08/06/14   Amy Cletis [place], MD   Meds Ordered and Administered this Visit  Medications - No data to display  BP 166/104 mmHg  Pulse 56  Temp(Src) 98.5 F (36.9 C) (Oral)  Resp 18  SpO2 96% No data found.   Physical Exam  Constitutional: He is oriented to person, place, and time. He appears well-developed and well-nourished. No distress.  HENT:  Head: Normocephalic and atraumatic.  Neck: Normal range of motion. Neck supple.  Cardiovascular: Normal rate, normal heart sounds and intact distal pulses.   Pulmonary/Chest: No respiratory distress. He has no wheezes. He has no  rales. He exhibits tenderness.  Abdominal: Soft. Bowel sounds are normal. He exhibits no distension. There is no tenderness.  Neurological: He is alert and oriented to person, place, and time.  Nursing note and vitals reviewed.   ED Course  Procedures (including critical care time)  Labs Review Labs Reviewed - No data to display  Imaging Review Dg Ribs Unilateral W/chest Left  10/27/2014   CLINICAL DATA:  Post fall hitting left ribs on deck.  EXAM: LEFT RIBS AND CHEST - 3+ VIEW  COMPARISON:  05/30/2013  FINDINGS: Grossly unchanged enlarged cardiac silhouette and mediastinal contours post median sternotomy. Atherosclerotic plaque within the thoracic aorta. Grossly unchanged bibasilar heterogeneous  opacities, left greater than right, likely atelectasis or scar. No new focal airspace opacities. No pleural effusion or pneumothorax. No evidence of edema.  No definite displaced left-sided rib fractures with special attention paid to the area demarcated by the radiopaque BB.  Post cholecystectomy.  IMPRESSION: No acute cardiopulmonary disease. Specifically, no definite displaced left-sided rib fractures.   Electronically Signed   By: Sandi Mariscal M.D.   On: 10/27/2014 19:17   X-rays reviewed and report per radiologist.   Visual Acuity Review  Right Eye Distance:   Left Eye Distance:   Bilateral Distance:    Right Eye Near:   Left Eye Near:    Bilateral Near:         MDM   1. Chest wall contusion, left, initial encounter        Billy Fischer, MD 10/27/14 1944

## 2014-10-28 ENCOUNTER — Other Ambulatory Visit (INDEPENDENT_AMBULATORY_CARE_PROVIDER_SITE_OTHER): Payer: Medicare Other

## 2014-10-28 DIAGNOSIS — Z79899 Other long term (current) drug therapy: Secondary | ICD-10-CM | POA: Diagnosis not present

## 2014-10-28 DIAGNOSIS — E785 Hyperlipidemia, unspecified: Secondary | ICD-10-CM

## 2014-10-28 DIAGNOSIS — E041 Nontoxic single thyroid nodule: Secondary | ICD-10-CM | POA: Diagnosis not present

## 2014-10-28 LAB — CBC WITH DIFFERENTIAL/PLATELET
BASOS ABS: 0 10*3/uL (ref 0.0–0.1)
BASOS PCT: 0.5 % (ref 0.0–3.0)
EOS ABS: 0.3 10*3/uL (ref 0.0–0.7)
Eosinophils Relative: 3.3 % (ref 0.0–5.0)
HEMATOCRIT: 48.2 % (ref 39.0–52.0)
Hemoglobin: 16.1 g/dL (ref 13.0–17.0)
LYMPHS PCT: 28.6 % (ref 12.0–46.0)
Lymphs Abs: 2.3 10*3/uL (ref 0.7–4.0)
MCHC: 33.4 g/dL (ref 30.0–36.0)
MCV: 103.3 fl — ABNORMAL HIGH (ref 78.0–100.0)
MONO ABS: 0.7 10*3/uL (ref 0.1–1.0)
Monocytes Relative: 8.8 % (ref 3.0–12.0)
NEUTROS ABS: 4.8 10*3/uL (ref 1.4–7.7)
Neutrophils Relative %: 58.8 % (ref 43.0–77.0)
PLATELETS: 102 10*3/uL — AB (ref 150.0–400.0)
RBC: 4.66 Mil/uL (ref 4.22–5.81)
RDW: 14.1 % (ref 11.5–15.5)
WBC: 8.1 10*3/uL (ref 4.0–10.5)

## 2014-10-28 LAB — HEPATIC FUNCTION PANEL
ALBUMIN: 4 g/dL (ref 3.5–5.2)
ALK PHOS: 72 U/L (ref 39–117)
ALT: 29 U/L (ref 0–53)
AST: 37 U/L (ref 0–37)
Bilirubin, Direct: 0.3 mg/dL (ref 0.0–0.3)
Total Bilirubin: 0.9 mg/dL (ref 0.2–1.2)
Total Protein: 7.1 g/dL (ref 6.0–8.3)

## 2014-10-28 LAB — LIPID PANEL
CHOLESTEROL: 124 mg/dL (ref 0–200)
HDL: 51.1 mg/dL (ref >39.00)
LDL Cholesterol: 57 mg/dL (ref 0–99)
NonHDL: 72.57
TRIGLYCERIDES: 77 mg/dL (ref 0.0–149.0)
Total CHOL/HDL Ratio: 2
VLDL: 15.4 mg/dL (ref 0.0–40.0)

## 2014-10-28 LAB — BASIC METABOLIC PANEL
BUN: 28 mg/dL — AB (ref 6–23)
CALCIUM: 9.4 mg/dL (ref 8.4–10.5)
CO2: 33 meq/L — AB (ref 19–32)
CREATININE: 1.39 mg/dL (ref 0.40–1.50)
Chloride: 102 mEq/L (ref 96–112)
GFR: 51.06 mL/min — ABNORMAL LOW (ref >60.00)
Glucose, Bld: 94 mg/dL (ref 70–99)
Potassium: 4.4 mEq/L (ref 3.5–5.1)
Sodium: 141 mEq/L (ref 135–145)

## 2014-10-28 LAB — TSH: TSH: 1.15 u[IU]/mL (ref 0.35–4.50)

## 2014-11-01 ENCOUNTER — Ambulatory Visit (INDEPENDENT_AMBULATORY_CARE_PROVIDER_SITE_OTHER): Payer: Medicare Other | Admitting: Family Medicine

## 2014-11-01 ENCOUNTER — Encounter: Payer: Self-pay | Admitting: Family Medicine

## 2014-11-01 VITALS — BP 131/59 | HR 49 | Temp 97.7°F | Ht 67.5 in | Wt 191.5 lb

## 2014-11-01 DIAGNOSIS — N183 Chronic kidney disease, stage 3 unspecified: Secondary | ICD-10-CM

## 2014-11-01 DIAGNOSIS — R0781 Pleurodynia: Secondary | ICD-10-CM

## 2014-11-01 DIAGNOSIS — E7800 Pure hypercholesterolemia, unspecified: Secondary | ICD-10-CM

## 2014-11-01 DIAGNOSIS — I5032 Chronic diastolic (congestive) heart failure: Secondary | ICD-10-CM

## 2014-11-01 DIAGNOSIS — Z951 Presence of aortocoronary bypass graft: Secondary | ICD-10-CM

## 2014-11-01 DIAGNOSIS — H3530 Unspecified macular degeneration: Secondary | ICD-10-CM

## 2014-11-01 DIAGNOSIS — M48061 Spinal stenosis, lumbar region without neurogenic claudication: Secondary | ICD-10-CM

## 2014-11-01 DIAGNOSIS — J449 Chronic obstructive pulmonary disease, unspecified: Secondary | ICD-10-CM

## 2014-11-01 DIAGNOSIS — I209 Angina pectoris, unspecified: Secondary | ICD-10-CM

## 2014-11-01 DIAGNOSIS — I739 Peripheral vascular disease, unspecified: Secondary | ICD-10-CM

## 2014-11-01 DIAGNOSIS — Z0000 Encounter for general adult medical examination without abnormal findings: Secondary | ICD-10-CM

## 2014-11-01 MED ORDER — FLUOXETINE HCL 10 MG PO TABS: 10.0000 mg | ORAL_TABLET | Freq: Every day | ORAL | Status: DC

## 2014-11-01 NOTE — Progress Notes (Signed)
Dr. Frederico Hamman T. Taheera Thomann, MD, [facility] [place], [address] Phone: [phone] Fax: [phone]  11/01/2014  Patient: Andrew [name], MRN: [id number], DOB: 13-Apr-1923, 81 y.o.  Primary Physician:  [name], MD  Chief Complaint: Annual Exam  Subjective:   Andrew [name] is a 81 y.o. pleasant patient who presents for a medicare wellness examination:  Preventative Health Maintenance Visit:  Health Maintenance Summary Reviewed and updated, unless pt declines services.  Tobacco History Reviewed. Alcohol: No concerns, no excessive use Exercise Habits: minimal  STD concerns: no risk or activity to increase risk Drug Use: None Encouraged self-testicular check  Recent fall, 10/27/2014 ER visit.   Wheezing more that normal, used proair and could not breathe. Only took 1 inhalation.  L rib fracture, most probable.  Rib belt?   Health Maintenance  Topic Date Due  . PNA vac Low Risk Adult (2 of 2 - PPSV23) 04/28/2015  . INFLUENZA VACCINE  08/23/2015  . TETANUS/TDAP  12/19/2020  . ZOSTAVAX  Completed    Immunization History  Administered Date(s) Administered  . Influenza Split 10/26/2010, 10/12/2012  . Influenza Whole 11/03/2008, 10/07/2009  . Influenza, High Dose Seasonal PF 10/07/2013, 10/19/2014  . Pneumococcal Conjugate-13 04/28/2014  . Tdap 12/20/2010  . Zoster 01/22/2006    Patient Active Problem List   Diagnosis Date Noted  . Chronic diastolic CHF (congestive heart failure) ([place]) 08/19/2012    Priority: High  . PVD- moderate carotid disease 03/07/2010    Priority: High  . Angina, class II ([place]) 06/18/2008    Priority: High  . Hx of CABG 06/18/2008    Priority: High  . Moderate aortic stenosis 06/18/2008    Priority: High  . Macular degeneration (senile) of retina 04/29/2014    Priority: Medium  . TIA (transient ischemic attack) 06/03/2013    Priority: Medium  . COPD, minimal-mild  12/22/2010    Priority: Medium  . Lumbar spinal stenosis 07/13/2009    Priority: Medium  . Chronic renal insufficiency, stage III (moderate) 06/02/2014  . 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 Major depressive disorder, single episode 06/11/2013  . Pancreatic cyst 01/29/2013  . OSA (obstructive sleep apnea) 12/26/2012  . S/P excision of acoustic neuroma 06/18/2010  . Pulmonary nodule 06/17/2010  . Vertigo 06/14/2010  . THYROID NODULE 03/13/2010  . ALLERGIC RHINITIS 09/01/2008  . COLONIC POLYPS, HX OF 09/01/2008  . HYPERCHOLESTEROLEMIA 06/18/2008  . GASTROESOPHAGEAL REFLUX DISEASE 06/18/2008   Past Medical History  Diagnosis Date  . HYPERCHOLESTEROLEMIA   . CAD, ARTERY BYPASS GRAFT   . CAROTID ARTERY STENOSIS 03/07/2010    80%  . AORTIC STENOSIS   . GASTROESOPHAGEAL REFLUX DISEASE   . Skin cancer   . History of colonic polyps     1999, 2004  . Allergic rhinitis   . Thyroid nodule 05/2010    Abnormal biopsy, 81 year old patient and his wife have made informed decision to not pursue surgical resection. Potential risk including cancer has been thoroughly discussed with the patient.  . S/P excision of acoustic neuroma   . History of prostate cancer 2002    s/p treatment with seeds / radiation  . Lumbar spinal stenosis 07/13/2009  . COPD, mild ([place]) 12/22/2010  . Pancreatic cyst 01/29/2013    Noted on MRI scan from [facility] on 07/27/2011.  I reviewed report at patient request on 01-29-2013.  "Small cystic focus in the posterior pancreatic head.  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  Follow up MRI is advised. The main pancreatic duct remains normal in appearance."  Patient do  . CHF (congestive heart failure) ([place])   . Macular degeneration (senile) of retina 04/29/2014   Past Surgical History  Procedure Laterality Date  . Coronary artery bypass graft    . Transperineal implatation of  palladium    . Cholecystectomy    . Shoulder surgery    . Total knee arthroplasty    . Cataract extraction    . Tonsillectomy and adenoidectomy  1932  . Craniectomy for excision of acoustic neuroma  1985  . Cataract extraction  2010  . Eye surgery     Social History   Social History  . Marital Status: Married    Spouse Name: N/A  . Number of Children: N/A  . Years of Education: N/A   Occupational History  . retired  At [employer]    and [employer]  . Smoking status: Former Smoker -- 2.00 packs/day for 20 years    Types: Cigarettes    Quit date: 01/22/1961  . Smokeless tobacco: Never Used  . Alcohol Use: 4.2 oz/week    7 Shots of liquor per week     Comment: 1 drink nightly  . Drug Use: No  . Sexual Activity: Not on file   Other Topics Concern  . Not on file   Social History Narrative   Family History  Problem Relation Age of Onset  . Emphysema Brother   . Lung cancer Brother   . Alcohol abuse    . Arthritis    . Cancer    . Macular degeneration    . Heart disease Father   . Hypertension Father   . Heart attack Father   . Colon cancer Sister   . Non-Hodgkin's lymphoma Sister   . Heart disease Brother   . Colon cancer Sister   . Skin cancer Sister   . Lung cancer Daughter    No Known Allergies  Medication list has been reviewed and updated.   General: Denies fever, chills, sweats. No significant weight loss. Eyes: MACULAR DEGENERATION IS WORSENING ENT: Denies earache, sore throat, and hoarseness. Cardiovascular: Denies chest pains, palpitations, dyspnea on exertion Respiratory: Denies cough, dyspnea at rest,wheeezing Breast: no concerns about lumps GI: Denies nausea, vomiting, diarrhea, constipation, change in bowel habits, abdominal pain, melena, hematochezia GU: Denies penile discharge, ED, urinary flow / outflow problems. No STD concerns. Musculoskeletal: CHEST WALL PAIN ON LEFT AFTER FALL Derm: Denies rash,  itching Neuro: Denies  paresthesias, frequent falls, frequent headaches Psych: Denies depression, anxiety Endocrine: Denies cold intolerance, heat intolerance, polydipsia Heme: Denies enlarged lymph nodes Allergy: No hayfever  Objective:   BP 131/59 mmHg  Pulse 49  Temp(Src) 97.7 F (36.5 C) (Oral)  Ht 5' 7.5" (1.715 m)  Wt 191 lb 8 oz (86.864 kg)  BMI 29.53 kg/m2  The patient completed a fall screen and PHQ-2 and PHQ-9 if necessary, which is documented in the EHR. The CMA/LPN/RN who assisted the patient verbally completed with them and documented results in [place].  Hearing Screening Comments: Wears Hearing Aides  Vision Screening Comments: Eye Exam with [facility] 10/27/2014  GEN: well developed, well nourished, no acute distress Eyes: conjunctiva and lids normal, PERRLA, EOMI ENT: TM clear, nares clear, oral exam WNL Neck: supple, no lymphadenopathy, no thyromegaly, no  JVD Pulm: clear to auscultation and percussion, respiratory effort normal Chest wall TTP on L, + Barrel sign CV: regular rate and rhythm, S1-S2, 2/6 SEM, rub or gallop, no bruits, peripheral pulses normal and symmetric, no cyanosis, clubbing, edema or varicosities GI: soft, non-tender; no hepatosplenomegaly, masses; active bowel sounds all quadrants GU: no hernia, testicular mass, penile discharge Lymph: no cervical, axillary or inguinal adenopathy MSK: gait normal, muscle tone and strength WNL, no joint swelling, effusions, discoloration, crepitus  SKIN: clear, good turgor, color WNL, no rashes, lesions, or ulcerations Neuro: normal mental status, normal strength, sensation, and motion Psych: alert; oriented to person, place and time, normally interactive and not anxious or depressed in appearance.  All labs reviewed with patient.  Lipids:    Component Value Date/Time   CHOL 124 10/28/2014 0946   TRIG 77.0 10/28/2014 0946   HDL 51.10 10/28/2014 0946   LDLDIRECT 67.0 03/13/2010  1453   VLDL 15.4 10/28/2014 0946   CHOLHDL 2 10/28/2014 0946   CBC: CBC Latest Ref Rng 10/28/2014 05/30/2013 11/28/2011  WBC 4.0 - 10.5 K/uL 8.1 6.1 6.0  Hemoglobin 13.0 - 17.0 g/dL 16.1 15.2 16.3  Hematocrit 39.0 - 52.0 % 48.2 47.5 49.0  Platelets 150.0 - 400.0 K/uL 102.0(L) - 97.0(L)    Basic Metabolic Panel:    Component Value Date/Time   NA 141 10/28/2014 0946   K 4.4 10/28/2014 0946   CL 102 10/28/2014 0946   CO2 33* 10/28/2014 0946   BUN 28* 10/28/2014 0946   CREATININE 1.39 10/28/2014 0946   GLUCOSE 94 10/28/2014 0946   CALCIUM 9.4 10/28/2014 0946   Hepatic Function Latest Ref Rng 10/28/2014 08/23/2011 12/20/2010  Total Protein 6.0 - 8.3 g/dL 7.1 6.2 6.3  Albumin 3.5 - 5.2 g/dL 4.0 3.9 3.8  AST 0 - 37 U/L 37 31 30  ALT 0 - 53 U/L 29 24 23   Alk Phosphatase 39 - 117 U/L 72 52 58  Total Bilirubin 0.2 - 1.2 mg/dL 0.9 1.3(H) 1.3(H)  Bilirubin, Direct 0.0 - 0.3 mg/dL 0.3 0.2 0.2    Lab Results  Component Value Date   TSH 1.15 10/28/2014   Lab Results  Component Value Date   PSA refused 01/03/2009    Assessment and Plan:   Healthcare maintenance  Chronic diastolic CHF (congestive heart failure) (HCC)  Angina, class II (HCC)  Chronic renal insufficiency, stage III (moderate)  COPD, minimal-mild  Hx of CABG  PVD- moderate carotid disease  Macular degeneration (senile) of retina  Lumbar spinal stenosis  HYPERCHOLESTEROLEMIA  Rib pain on left side  Health Maintenance Exam: The patient's preventative maintenance and recommended screening tests for an annual wellness exam were reviewed in full today. Brought up to date unless services declined.  Counselled on the importance of diet, exercise, and its role in overall health and mortality. The patient's FH and SH was reviewed, including their home life, tobacco status, and drug and alcohol status.  I have personally reviewed the Medicare Annual Wellness questionnaire and have noted 1. The patient's medical  and social history 2. Their use of alcohol, tobacco or illicit drugs 3. Their current medications and supplements 4. The patient's functional ability including ADL's, fall risks, home safety risks and hearing or visual             impairment. 5. Diet and physical activities 6. Evidence for depression or mood disorders 7. Reviewed Updated provider list, see scanned forms and CHL Snapshot.   The patients weight, height, BMI and visual  acuity have been recorded in the chart I have made referrals, counseling and provided education to the patient based review of the above and I have provided the pt with a written personalized care plan for preventive services.  I have provided the patient with a copy of your personalized plan for preventive services. Instructed to take the time to review along with their updated medication list.  Overall doing well.  Likely fractured L rib despite x-ray. Rib belt + time.  Follow-up: Return in about 6 months (around 05/02/2015). Or follow-up in 1 year for complete physical examination  New Prescriptions   No medications on file   No orders of the defined types were placed in this encounter.    Signed,  Maud Deed. Kadajah Kjos, MD   Patient's Medications  New Prescriptions   No medications on file  Previous Medications   ALBUTEROL (PROVENTIL HFA;VENTOLIN HFA) 108 (90 BASE) MCG/ACT INHALER    Inhale 2 puffs into the lungs every 6 (six) hours as needed for wheezing or shortness of breath.   ASPIRIN 325 MG TABLET    Take 325 mg by mouth daily.   B COMPLEX-C-FOLIC ACID (NEPHRO-VITE PO)    Take 1 tablet by mouth daily.     CHOLECALCIFEROL (VITAMIN D) 400 UNITS TABS    Take 800 Units by mouth.    FUROSEMIDE (LASIX) 40 MG TABLET    TAKE 1 TABLET BY MOUTH EVERY MORNING AND 1/2 TABEVERY EVENING   ISOSORBIDE MONONITRATE (IMDUR) 60 MG 24 HR TABLET    1 and 1/2 tablets (70m) daily   METOPROLOL SUCCINATE (TOPROL-XL) 25 MG 24 HR TABLET    TAKE 1 TABLET DAILY    MULTIPLE VITAMINS-MINERALS (RA VISION-VITE PRESERVE PO)    Take 2 capsules by mouth daily.     NAPROXEN SODIUM (ANAPROX) 220 MG TABLET    Take 220 mg by mouth as needed.   NITROGLYCERIN (NITROLINGUAL) 0.4 MG/SPRAY SPRAY    PLACE 1 SPRAY UNDER THE TONGUE EVERY 5 (FIVE) MINUTES AS NEEDED.   POTASSIUM CHLORIDE SA (K-DUR,KLOR-CON) 20 MEQ TABLET    TAKE 1 TABLET BY MOUTH DAILY.   PRAVASTATIN (PRAVACHOL) 40 MG TABLET    TAKE 1 TABLET DAILY   TRAMADOL (ULTRAM) 50 MG TABLET    Take 0.5-1 tablets (25-50 mg total) by mouth every 6 (six) hours as needed.  Modified Medications   Modified Medication Previous Medication   FLUOXETINE (PROZAC) 10 MG TABLET FLUoxetine (PROZAC) 10 MG tablet      Take 1 tablet (10 mg total) by mouth daily.    TAKE 1 TABLET BY MOUTH DAILY.  Discontinued Medications   HYDROCODONE-ACETAMINOPHEN (NORCO/VICODIN) 5-325 MG TABLET    Take 1 tablet by mouth every 6 (six) hours as needed.

## 2014-11-01 NOTE — Progress Notes (Signed)
Pre visit review using our clinic review tool, if applicable. No additional management support is needed unless otherwise documented below in the visit note. 

## 2014-11-30 ENCOUNTER — Encounter: Payer: Self-pay | Admitting: Cardiology

## 2014-11-30 ENCOUNTER — Ambulatory Visit (INDEPENDENT_AMBULATORY_CARE_PROVIDER_SITE_OTHER): Payer: Medicare Other | Admitting: Cardiology

## 2014-11-30 ENCOUNTER — Encounter: Payer: Self-pay | Admitting: *Deleted

## 2014-11-30 VITALS — BP 132/60 | HR 51 | Ht 69.0 in | Wt 191.0 lb

## 2014-11-30 DIAGNOSIS — Z951 Presence of aortocoronary bypass graft: Secondary | ICD-10-CM | POA: Diagnosis not present

## 2014-11-30 DIAGNOSIS — I350 Nonrheumatic aortic (valve) stenosis: Secondary | ICD-10-CM

## 2014-11-30 DIAGNOSIS — N189 Chronic kidney disease, unspecified: Secondary | ICD-10-CM

## 2014-11-30 DIAGNOSIS — I5032 Chronic diastolic (congestive) heart failure: Secondary | ICD-10-CM

## 2014-11-30 DIAGNOSIS — N183 Chronic kidney disease, stage 3 unspecified: Secondary | ICD-10-CM

## 2014-11-30 NOTE — Patient Instructions (Signed)
Medication Instructions:  No changes today  Labwork: None today  Testing/Procedures: Your physician has requested that you have an echocardiogram. Echocardiography is a painless test that uses sound waves to create images of your heart. It provides your doctor with information about the size and shape of your heart and how well your heart's chambers and valves are working. This procedure takes approximately one hour. There are no restrictions for this procedure.    Follow-Up: Your physician wants you to follow-up in: 6 months with Dr Aundra Dubin. (May 2017). You will receive a reminder letter in the mail two months in advance. If you don't receive a letter, please call our office to schedule the follow-up appointment.        If you need a refill on your cardiac medications before your next appointment, please call your pharmacy.

## 2014-12-01 NOTE — Progress Notes (Signed)
Patient ID: Andrew [name], male   DOB: [birth year], 81 y.o.   MRN: [id number] PCP: Dr [name]  81 yo with history of CAD s/p CABG, CKD, and moderate aortic stenosis returns for followup.   He has had a history of chronic stable angina for years.  He has chronically had chest tightness after walking fast for about 100 yards.  In 12/14, he had a Lexiscan Cardiolite which showed EF 42% with medium-sized scar in the lateral wall along with mild peri-infarct ischemia.  This was deemed a low risk study.  I started the patient on ranolazine, which seemed to help his angina, but this was subsequently stopped by nephrology.  Therefore, I increased his Imdur, he is now taking 90 mg daily.   Recently, he has been doing well.  Minimal chest pain, sometimes notes when he walks after a large meal.  Overall, anginal pattern has improved compared to the past. He is having more problems now with spinal stenosis and low back pain + hip pain than anything else.  He has been riding a recumbent bicycle for about 20 minutes daily.  He has stable dyspnea after walking for about 50 yards.  He had 2 falls this year, both from tripping.  No lightheadedness or syncope.   Labs (11/13): K 4.4, creatinine 1.3, LDL 66, HDL 53 Labs (7/14): BNP 590 Labs (8/14): K 4.4, creatinine 1.4, BNP 218, LDL 53, HDL 43 Labs (11/14): K 4.1, creatinine 1.3 Labs (1/15): Creatinine 1.7 Labs (2/15): K 3.9, creatinine 1.4 Labs (3/15): K 4.1, creatinine 1.6 Labs (5/15): K 4.6, creatinine 1.3, LDL 50, HDL 45 Labs (10/16): K 4.4, creatinine 1.39, TSH normal, LDL 57, HDL 51  PMH: 1. CAD: s/p CABG in 1991 with LIMA-LAD, SVG-PDA, seq SVG to OM 1,2,3.  [place] [address] complicated by cholesterol atheroemboli and AKI.  One limb of seq SVG-OM 1,2,3 known to be occluded.  Chronic stable angina.  Lexiscan Cardiolite (12/14) with EF 42%, inferolateral hypokinesis, medium-sized scar in the lateral wall with mild peri-infarct ischemia, overall low risk.  2. Carotid  stenosis: 7/13 carotid dopplers 40-59% on right, 60-79% on left.  Carotids (1/14) with 82-99% RICA, 37-16% LICA.  Carotids (9/67) with 89-38% LICA, 10-17% RICA.  Possible TIA in 4/15. Carotids (5/10) with 25-85% LICA, 27-78% RICA.  Carotids (2/42) with 35-36% LICA.  Followed by [facility].  3. GERD 4. Polyps 5. Thyroid nodule 6. S/p excision of acoustic neuroma 7. Prostate cancer treated with seed implantation in 2002 8. COPD: mild 9. Cholecystectomy 10. CKD 11. Aortic stenosis: Moderate.  Echo (2/14) with EF 50-55%, mild LVH, moderate AS with AVA 1.3 cm^2 and mean gradient 23 mmHg.  Echo (8/14) with EF 55-60%, moderate LVH, moderate AS with mean gradient 27 mmHg and AVA 1.03 cm^2, mild to moderate AI, mild MR.  Echo (8/15) with EF 50-55%, moderate AS with mean gradient 28/peak 45, mild AI, mild MR, severe LAE.  12. Chronic diastolic CHF.  13. Macular degeneration 14. Mild to moderate OSA on sleep study. He is not on CPAP.  15. Depression 16. Migraines 17. Low back pain/spinal stenosis  SH: Retired Chief Financial Officer, quit smoking 1963, married, lives in [facility] [facility].  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   Current Outpatient Prescriptions  Medication Sig Dispense Refill  . albuterol (PROVENTIL HFA;VENTOLIN HFA) 108 (90 BASE) MCG/ACT inhaler Inhale 2 puffs into the lungs every 6 (six) hours as needed for wheezing or shortness of breath. 1 Inhaler 3  .  aspirin 325 MG tablet Take 325 mg by mouth daily.    . B Complex-C-Folic Acid (NEPHRO-VITE PO) Take 1 tablet by mouth daily.      . cholecalciferol (VITAMIN D) 400 UNITS TABS Take 800 Units by mouth.     [name] 10 MG tablet Take 1 tablet (10 mg total) by mouth daily. 90 tablet 1  . furosemide (LASIX) 40 MG tablet TAKE 1 TABLET BY MOUTH EVERY MORNING AND 1/2 TABEVERY EVENING 135 tablet 3  . isosorbide mononitrate (IMDUR) 60 MG 24 hr tablet Take 1 1/2 tablets by mouth daily    . metoprolol succinate (TOPROL-XL)  25 MG 24 hr tablet Take 25 mg by mouth daily.    . Multiple Vitamins-Minerals (RA VISION-VITE PRESERVE PO) Take 2 capsules by mouth daily.      . naproxen sodium (ANAPROX) 220 MG tablet Take 220 mg by mouth as needed.    . nitroGLYCERIN (NITROLINGUAL) 0.4 MG/SPRAY spray PLACE 1 SPRAY UNDER THE TONGUE EVERY 5 (FIVE) MINUTES AS NEEDED. 4.9 g 12  . potassium chloride SA (K-DUR,KLOR-CON) 20 MEQ tablet TAKE 1 TABLET BY MOUTH DAILY. 90 tablet 0  . pravastatin (PRAVACHOL) 40 MG tablet Take 40 mg by mouth daily.    . traMADol (ULTRAM) 50 MG tablet Take 0.5-1 tablets (25-50 mg total) by mouth every 6 (six) hours as needed. 30 tablet 0   No current facility-administered medications for this visit.    BP 132/60 mmHg  Pulse 51  Ht 5\' 9"  (1.753 m)  Wt 191 lb (86.637 kg)  BMI 28.19 kg/m2 General: NAD Neck: JVP 7 cm, no thyromegaly or thyroid nodule.  Lungs: CTAB  CV: Nondisplaced PMI.  Heart regular S1/S2, no S3/S4, 3/6 SEM RUSB with some obscuring of S2.  No edema.  Right carotid bruit.  Normal pedal pulses.  Abdomen: Soft, nontender, no hepatosplenomegaly, no distention.  Skin: Intact without lesions or rashes.  Neurologic: Alert and oriented x 3.  Psych: Normal affect. Extremities: No clubbing or cyanosis.   Assessment/Plan: 1. CAD: s/p CABG with long history of chronic stable angina.  He had a very difficult time with last cardiac cath (cholesterol atheroemboli and AKI) and wants cath only as last resort.  Lexiscan Cardiolite in 12/14 showed lateral MI with mild peri-infarct ischemia consistent with known occluded limb of sequential SVG-OMs (low risk).  He is now off ranolazine (told to stop by nephrology).  Less angina now with Imdur increased to 90 mg daily. - Continue Toprol XL and Imdur at current doses.  - Continue ASA and statin.  2. Chronic diastolic CHF: EF 00-92% on 8/15 echo. NYHA class III symptoms. Patient looks euvolemic on current Lasix dose. - Continue Lasix 40 mg qam, 20 mg qpm.     3. Hyperlipidemia: Good lipids 10/16.  4. CKD: Last creatinine 1.39, stable.   5. Aortic stenosis: Moderate on 8/15 echo.   I am going to arrange followup echo to reassess AS.  If AS progresses to severe, he would not be a good surgical AVR candidate but may be a good TAVR candidate.  6. Carotid stenosis: Followed by [facility]. No further TIA-like symptoms. 7. OSA: Mild to moderate OSA on sleep study.  Not on CPAP at this point.    Followup in 6 months.   [name] 12/01/2014

## 2014-12-08 ENCOUNTER — Other Ambulatory Visit: Payer: Self-pay

## 2014-12-08 ENCOUNTER — Ambulatory Visit (HOSPITAL_COMMUNITY): Payer: Medicare Other | Attending: Internal Medicine

## 2014-12-08 DIAGNOSIS — I071 Rheumatic tricuspid insufficiency: Secondary | ICD-10-CM | POA: Insufficient documentation

## 2014-12-08 DIAGNOSIS — I352 Nonrheumatic aortic (valve) stenosis with insufficiency: Secondary | ICD-10-CM | POA: Diagnosis not present

## 2014-12-08 DIAGNOSIS — I517 Cardiomegaly: Secondary | ICD-10-CM | POA: Diagnosis not present

## 2014-12-08 DIAGNOSIS — I350 Nonrheumatic aortic (valve) stenosis: Secondary | ICD-10-CM | POA: Diagnosis not present

## 2014-12-08 DIAGNOSIS — I359 Nonrheumatic aortic valve disorder, unspecified: Secondary | ICD-10-CM | POA: Diagnosis present

## 2014-12-08 DIAGNOSIS — I340 Nonrheumatic mitral (valve) insufficiency: Secondary | ICD-10-CM | POA: Diagnosis not present

## 2015-01-07 ENCOUNTER — Other Ambulatory Visit: Payer: Self-pay | Admitting: Cardiology

## 2015-01-09 ENCOUNTER — Other Ambulatory Visit: Payer: Self-pay | Admitting: Cardiology

## 2015-01-17 ENCOUNTER — Other Ambulatory Visit: Payer: Self-pay | Admitting: Cardiology

## 2015-02-07 ENCOUNTER — Other Ambulatory Visit: Payer: Self-pay | Admitting: Cardiology

## 2015-02-11 ENCOUNTER — Encounter: Payer: Self-pay | Admitting: Family

## 2015-02-14 ENCOUNTER — Other Ambulatory Visit: Payer: Self-pay | Admitting: Dermatology

## 2015-02-21 ENCOUNTER — Ambulatory Visit (HOSPITAL_COMMUNITY)
Admission: RE | Admit: 2015-02-21 | Discharge: 2015-02-21 | Disposition: A | Discharge status: Home / Self Care | Payer: Medicare Other | Source: Ambulatory Visit | Attending: Family | Admitting: Family

## 2015-02-21 ENCOUNTER — Other Ambulatory Visit: Payer: Self-pay | Admitting: Cardiology

## 2015-02-21 ENCOUNTER — Encounter: Payer: Self-pay | Admitting: Family

## 2015-02-21 ENCOUNTER — Ambulatory Visit (INDEPENDENT_AMBULATORY_CARE_PROVIDER_SITE_OTHER): Payer: Medicare Other | Admitting: Family

## 2015-02-21 VITALS — BP 122/57 | HR 49 | Ht 69.0 in | Wt 194.0 lb

## 2015-02-21 DIAGNOSIS — I6523 Occlusion and stenosis of bilateral carotid arteries: Secondary | ICD-10-CM | POA: Insufficient documentation

## 2015-02-21 DIAGNOSIS — E7800 Pure hypercholesterolemia, unspecified: Secondary | ICD-10-CM | POA: Diagnosis not present

## 2015-02-21 NOTE — Progress Notes (Signed)
Chief Complaint: Extracranial Carotid Artery Stenosis   History of Present Illness  Andrew [name] is a 82 y.o. male patient of [name] who returns for follow up of known extracranial carotid artery stenosis.  Patient has not had previous carotid artery intervention.  The patient denies any history of TIA or stroke symptoms, specifically the patient denies a history of amaurosis fugax or monocular blindness, denies a history unilateral of facial drooping, denies a history of hemiplegia, and denies a history of receptive or expressive aphasia.   He did have several episodes of global amnesia, states [name] assured him this was not a stroke or TIA.  The patient reports New Medical or Surgical History: none.  He fell backwards on stairs June 2016, was evaluated at an urgent care center. Wife states he has confirmed vertebral fracture of L3. Since this fall his right leg occasionally "feels like rubber".  Pt states he has spinal stenosis and receives injections in his back to help this, he does not seem to have claudication symptoms, denies non healing wounds. He avoids activity that brings on chest pain and has not had to use NTG as much. Dr. Aundra Dubin is his cardiologist. He "gets winded pretty quick". He states he has a hx of a mild MI.   He reports left facial droop since left acoustic neuroma excised in 1985.  He uses a recumbent bike daily for 20 minutes.   Pt Diabetic: No Pt smoker: former smoker, quit in 1963  Pt meds include: Statin : Yes ASA: Yes Other anticoagulants/antiplatelets: no    Past Medical History  Diagnosis Date  . HYPERCHOLESTEROLEMIA   . CAD, ARTERY BYPASS GRAFT   . CAROTID ARTERY STENOSIS 03/07/2010    80%  . AORTIC STENOSIS   . GASTROESOPHAGEAL REFLUX DISEASE   . Skin cancer   . History of colonic polyps     1999, 2004  . Allergic rhinitis   . Thyroid nodule 05/2010    Abnormal biopsy, 82 year old patient and his wife have made informed  decision to not pursue surgical resection. Potential risk including cancer has been thoroughly discussed with the patient.  . S/P excision of acoustic neuroma   . History of prostate cancer 2002    s/p treatment with seeds / radiation  . Lumbar spinal stenosis 07/13/2009  . COPD, mild ([place]) 12/22/2010  . Pancreatic cyst 01/29/2013    Noted on MRI scan from [facility] on 07/27/2011.  I reviewed report at patient request on 01-29-2013.  "Small cystic focus in the posterior pancreatic head.  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  Follow up MRI is advised. The main pancreatic duct remains normal in appearance."  Patient do  . CHF (congestive heart failure) ([place])   . Macular degeneration (senile) of retina 04/29/2014    Social History Social History  Substance Use Topics  . Smoking status: Former Smoker -- 2.00 packs/day for 20 years    Types: Cigarettes    Quit date: 01/22/1961  . Smokeless tobacco: Never Used  . Alcohol Use: 4.2 oz/week    7 Shots of liquor per week     Comment: 1 drink nightly    Family History Family History  Problem Relation Age of Onset  . Emphysema Brother   . Lung cancer Brother   . Alcohol abuse    . Arthritis    . Cancer    .  Macular degeneration    . Heart disease Father   . Hypertension Father   . Heart attack Father   . Colon cancer Sister   . Non-Hodgkin's lymphoma Sister   . Heart disease Brother   . Colon cancer Sister   . Skin cancer Sister   . Lung cancer Daughter     Surgical History Past Surgical History  Procedure Laterality Date  . Coronary artery bypass graft    . Transperineal implatation of palladium    . Cholecystectomy    . Shoulder surgery    . Total knee arthroplasty    . Cataract extraction    . Tonsillectomy and adenoidectomy  1932  . Craniectomy for excision of acoustic neuroma  1985  . Cataract extraction  2010  .  Eye surgery      No Known Allergies  Current Outpatient Prescriptions  Medication Sig Dispense Refill  . albuterol (PROVENTIL HFA;VENTOLIN HFA) 108 (90 BASE) MCG/ACT inhaler Inhale 2 puffs into the lungs every 6 (six) hours as needed for wheezing or shortness of breath. 1 Inhaler 3  . aspirin 325 MG tablet Take 325 mg by mouth daily.    . B Complex-C-Folic Acid (NEPHRO-VITE PO) Take 1 tablet by mouth daily.      . cholecalciferol (VITAMIN D) 400 UNITS TABS Take 800 Units by mouth.     [name] 10 MG tablet Take 1 tablet (10 mg total) by mouth daily. 90 tablet 1  . furosemide (LASIX) 40 MG tablet TAKE 1 TABLET BY MOUTH EVERY MORNING AND 1/2 TABEVERY EVENING 135 tablet 3  . isosorbide mononitrate (IMDUR) 60 MG 24 hr tablet Take 1 1/2 tablets by mouth daily    . isosorbide mononitrate (IMDUR) 60 MG 24 hr tablet Take 1.5 tablets (90 mg total) by mouth daily. 135 tablet 3  . metoprolol succinate (TOPROL-XL) 25 MG 24 hr tablet Take 1 tablet (25 mg total) by mouth daily. 90 tablet 3  . Multiple Vitamins-Minerals (RA VISION-VITE PRESERVE PO) Take 2 capsules by mouth daily.      . naproxen sodium (ANAPROX) 220 MG tablet Take 220 mg by mouth as needed.    . nitroGLYCERIN (NITROLINGUAL) 0.4 MG/SPRAY spray PLACE 1 SPRAY UNDER THE TONGUE EVERY 5 (FIVE) MINUTES AS NEEDED. 4.9 g 12  . potassium chloride SA (K-DUR,KLOR-CON) 20 MEQ tablet TAKE 1 TABLET BY MOUTH DAILY. 90 tablet 2  . pravastatin (PRAVACHOL) 40 MG tablet TAKE 1 TABLET DAILY 90 tablet 0  . traMADol (ULTRAM) 50 MG tablet Take 0.5-1 tablets (25-50 mg total) by mouth every 6 (six) hours as needed. (Patient not taking: Reported on 02/21/2015) 30 tablet 0   No current facility-administered medications for this visit.    Review of Systems : See HPI for pertinent positives and negatives.  Physical Examination  Filed Vitals:   02/21/15 1116 02/21/15 1118  BP: 133/60 122/57  Pulse: 49   Height: 5\' 9"  (1.753 m)   Weight: 194 lb  (87.998 kg)   SpO2: 96%    Body mass index is 28.64 kg/(m^2).  General: WDWN male in NAD GAIT: normal Eyes: PERRLA Pulmonary: Non-labored, CTAB, Negative Rales, Negative rhonchi, & Negative wheezing.  Cardiac: regular Rhythm,+ murmur.  VASCULAR EXAM Carotid Bruits Right Left   positive Positive   Aorta is not palpable. Radial pulses are 2+ palpable and equal.      LE Pulses Right Left   POPLITEAL not palpable  not palpable   POSTERIOR TIBIAL not palpable  not palpable  DORSALIS PEDIS  ANTERIOR TIBIAL not palpable  not palpable     Gastrointestinal: soft, nontender, BS WNL, no r/g,no palpable masses.  Musculoskeletal: Negative muscle atrophy/wasting. M/S 5/5 throughout, Extremities without ischemic changes.  Neurologic: A&O X 3; Appropriate Affect, Speech is normal CN 2-12 intact except is hard of hearing, Pain and light touch intact in extremities, Motor exam as listed above.               Non-Invasive Vascular Imaging CAROTID DUPLEX 02/21/2015   Right ICA: 1 - 39 % stenosis. Left ICA: 60 - 79 % stenosis. No significant change compared to 08/09/14 exam.   Assessment: Andrew [name] is a 82 y.o. male who has no history of stroke or TIA but does have CAD. Today's carotid Duplex suggests minimal right ICA stenosis and 60 - 79 % left ICA stenosis. No significant change compared to 08/09/14 exam.   Plan: Follow-up in 6 months with Carotid Duplex scan.   I discussed in depth with the patient the nature of atherosclerosis, and emphasized the importance of maximal medical management including strict control of blood pressure, blood glucose, and lipid levels, obtaining regular exercise, and continued cessation of smoking.  The patient is aware that without maximal medical  management the underlying atherosclerotic disease process will progress, limiting the benefit of any interventions. The patient was given information about stroke prevention and what symptoms should prompt the patient to seek immediate medical care. Thank you for allowing [place] to participate in this patient's care.  Clemon Chambers, RN, MSN, FNP-C [facility]: [facility] Physician: Trula Slade  02/21/2015 11:47 AM

## 2015-02-21 NOTE — Patient Instructions (Signed)
Stroke Prevention Some medical conditions and behaviors are associated with an increased chance of having a stroke. You may prevent a stroke by making healthy choices and managing medical conditions. HOW CAN I REDUCE MY RISK OF HAVING A STROKE?   Stay physically active. Get at least 30 minutes of activity on most or all days.  Do not smoke. It may also be helpful to avoid exposure to secondhand smoke.  Limit alcohol use. Moderate alcohol use is considered to be:  No more than 2 drinks per day for men.  No more than 1 drink per day for nonpregnant women.  Eat healthy foods. This involves:  Eating 5 or more servings of fruits and vegetables a day.  Making dietary changes that address high blood pressure (hypertension), high cholesterol, diabetes, or obesity.  Manage your cholesterol levels.  Making food choices that are high in fiber and low in saturated fat, trans fat, and cholesterol may control cholesterol levels.  Take any prescribed medicines to control cholesterol as directed by your health care provider.  Manage your diabetes.  Controlling your carbohydrate and sugar intake is recommended to manage diabetes.  Take any prescribed medicines to control diabetes as directed by your health care provider.  Control your hypertension.  Making food choices that are low in salt (sodium), saturated fat, trans fat, and cholesterol is recommended to manage hypertension.  Ask your health care provider if you need treatment to lower your blood pressure. Take any prescribed medicines to control hypertension as directed by your health care provider.  If you are 18-39 years of age, have your blood pressure checked every 3-5 years. If you are 40 years of age or older, have your blood pressure checked every year.  Maintain a healthy weight.  Reducing calorie intake and making food choices that are low in sodium, saturated fat, trans fat, and cholesterol are recommended to manage  weight.  Stop drug abuse.  Avoid taking birth control pills.  Talk to your health care provider about the risks of taking birth control pills if you are over 35 years old, smoke, get migraines, or have ever had a blood clot.  Get evaluated for sleep disorders (sleep apnea).  Talk to your health care provider about getting a sleep evaluation if you snore a lot or have excessive sleepiness.  Take medicines only as directed by your health care provider.  For some people, aspirin or blood thinners (anticoagulants) are helpful in reducing the risk of forming abnormal blood clots that can lead to stroke. If you have the irregular heart rhythm of atrial fibrillation, you should be on a blood thinner unless there is a good reason you cannot take them.  Understand all your medicine instructions.  Make sure that other conditions (such as anemia or atherosclerosis) are addressed. SEEK IMMEDIATE MEDICAL CARE IF:   You have sudden weakness or numbness of the face, arm, or leg, especially on one side of the body.  Your face or eyelid droops to one side.  You have sudden confusion.  You have trouble speaking (aphasia) or understanding.  You have sudden trouble seeing in one or both eyes.  You have sudden trouble walking.  You have dizziness.  You have a loss of balance or coordination.  You have a sudden, severe headache with no known cause.  You have new chest pain or an irregular heartbeat. Any of these symptoms may represent a serious problem that is an emergency. Do not wait to see if the symptoms will   go away. Get medical help at once. Call your local emergency services (911 in [place] Do not drive yourself to the hospital.   This information is not intended to replace advice given to you by your health care provider. Make sure you discuss any questions you have with your health care provider.   Document Released: 02/16/2004 Document Revised: 01/29/2014 Document Reviewed:  07/11/2012 Elsevier Interactive Patient Education 2016 Elsevier Inc.  

## 2015-02-22 NOTE — Addendum Note (Signed)
Addended by: Thresa Ross C on: 02/22/2015 10:02 AM   Modules accepted: Orders

## 2015-04-17 ENCOUNTER — Other Ambulatory Visit: Payer: Self-pay | Admitting: Cardiology

## 2015-05-02 ENCOUNTER — Ambulatory Visit (INDEPENDENT_AMBULATORY_CARE_PROVIDER_SITE_OTHER): Payer: Medicare Other | Admitting: Family Medicine

## 2015-05-02 ENCOUNTER — Encounter: Payer: Self-pay | Admitting: Family Medicine

## 2015-05-02 VITALS — BP 100/54 | HR 49 | Temp 98.3°F | Ht 69.0 in | Wt 195.8 lb

## 2015-05-02 DIAGNOSIS — M4806 Spinal stenosis, lumbar region: Secondary | ICD-10-CM | POA: Diagnosis not present

## 2015-05-02 DIAGNOSIS — N2889 Other specified disorders of kidney and ureter: Secondary | ICD-10-CM

## 2015-05-02 DIAGNOSIS — J449 Chronic obstructive pulmonary disease, unspecified: Secondary | ICD-10-CM | POA: Diagnosis not present

## 2015-05-02 DIAGNOSIS — H3530 Unspecified macular degeneration: Secondary | ICD-10-CM | POA: Diagnosis not present

## 2015-05-02 DIAGNOSIS — N189 Chronic kidney disease, unspecified: Secondary | ICD-10-CM

## 2015-05-02 DIAGNOSIS — N183 Chronic kidney disease, stage 3 unspecified: Secondary | ICD-10-CM

## 2015-05-02 DIAGNOSIS — I209 Angina pectoris, unspecified: Secondary | ICD-10-CM | POA: Diagnosis not present

## 2015-05-02 DIAGNOSIS — M48062 Spinal stenosis, lumbar region with neurogenic claudication: Secondary | ICD-10-CM

## 2015-05-02 MED ORDER — FLUOXETINE HCL 10 MG PO TABS: 10.0000 mg | ORAL_TABLET | Freq: Every day | ORAL | Status: DC

## 2015-05-02 NOTE — Patient Instructions (Signed)
Genteal rewetting drops - severe or moderately severe.   Take Tylenol/Acetaminophen ES (500mg ) 2 tabs by mouth three times a day max as needed.

## 2015-05-02 NOTE — Progress Notes (Signed)
Dr. Frederico Hamman T. Mister Krahenbuhl, MD, [facility] [place], [address] Phone: [phone] Fax: [phone]  05/02/2015  Patient: Andrew [name], MRN: [id number], DOB: 02-Feb-1923, 82 y.o.  Primary Physician:  [name], MD   No chief complaint on file.  Subjective:   Andrew [name] is a 82 y.o. very pleasant male patient who presents with the following:  Follow-up multiple chronic conditions.  Generally for 82 years old he is doing well.  He is frustrated with his worsening vision secondary to macular degeneration which has been getting worse over time.  Having some back pain in the back - has done some ESI, and has been less than a a year since his most recent injection.  These have been done by Dr. Ernestina Patches.  He has pain in the back and pain in his posterior buttocks much of the time.  He describes this as hip pain, but he has good hip range of motion and does not have any pain in the true groin.  He also has some pain in the SI joints.  This is been long-standing, and the patient has had degenerative disc disease, spinal stenosis, and chronic back spondyloarthropathy for many years.   Past Medical History, Surgical History, Social History, Family History, Problem List, Medications, and Allergies have been reviewed and updated if relevant.  Patient Active Problem List   Diagnosis Date Noted  . Chronic diastolic CHF (congestive heart failure) ([place]) 08/19/2012    Priority: High  . PVD- moderate carotid disease 03/07/2010    Priority: High  . Angina, class II ([place]) 06/18/2008    Priority: High  . Hx of CABG 06/18/2008    Priority: High  . Moderate aortic stenosis 06/18/2008    Priority: High  . Macular degeneration (senile) of retina 04/29/2014    Priority: Medium  . TIA (transient ischemic attack) 06/03/2013    Priority: Medium  . COPD, minimal-mild 12/22/2010    Priority: Medium  . Lumbar spinal stenosis 07/13/2009      Priority: Medium  . Chronic renal insufficiency, stage III (moderate) 06/02/2014  . 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 Major depressive disorder, single episode 06/11/2013  . Pancreatic cyst 01/29/2013  . OSA (obstructive sleep apnea) 12/26/2012  . S/P excision of acoustic neuroma 06/18/2010  . Pulmonary nodule 06/17/2010  . Vertigo 06/14/2010  . THYROID NODULE 03/13/2010  . ALLERGIC RHINITIS 09/01/2008  . COLONIC POLYPS, HX OF 09/01/2008  . HYPERCHOLESTEROLEMIA 06/18/2008  . GASTROESOPHAGEAL REFLUX DISEASE 06/18/2008    Past Medical History  Diagnosis Date  . HYPERCHOLESTEROLEMIA   . CAD, ARTERY BYPASS GRAFT   . CAROTID ARTERY STENOSIS 03/07/2010    80%  . AORTIC STENOSIS   . GASTROESOPHAGEAL REFLUX DISEASE   . Skin cancer   . History of colonic polyps     1999, 2004  . Allergic rhinitis   . Thyroid nodule 05/2010    Abnormal biopsy, 82 year old patient and his wife have made informed decision to not pursue surgical resection. Potential risk including cancer has been thoroughly discussed with the patient.  . S/P excision of acoustic neuroma   . History of prostate cancer 2002    s/p treatment with seeds / radiation  . Lumbar spinal stenosis 07/13/2009  . COPD, mild ([place]) 12/22/2010  . Pancreatic cyst 01/29/2013    Noted on MRI scan from [facility] on 07/27/2011.  I reviewed report at patient request on 01-29-2013.  "  Small cystic focus in the posterior pancreatic head.  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  Follow up MRI is advised. The main pancreatic duct remains normal in appearance."  Patient do  . CHF (congestive heart failure) ([place])   . Macular degeneration (senile) of retina 04/29/2014    Past Surgical History  Procedure Laterality Date  . Coronary artery bypass graft    . Transperineal implatation of palladium    . Cholecystectomy    . Shoulder surgery    .  Total knee arthroplasty    . Cataract extraction    . Tonsillectomy and adenoidectomy  1932  . Craniectomy for excision of acoustic neuroma  1985  . Cataract extraction  2010  . Eye surgery      Social History   Social History  . Marital Status: Married    Spouse Name: N/A  . Number of Children: N/A  . Years of Education: N/A   Occupational History  . retired  At [employer]    and [employer] Topics  . Smoking status: Former Smoker -- 2.00 packs/day for 20 years    Types: Cigarettes    Quit date: 01/22/1961  . Smokeless tobacco: Never Used  . Alcohol Use: 4.2 oz/week    7 Shots of liquor per week     Comment: 1 drink nightly  . Drug Use: No  . Sexual Activity: Not on file   Other Topics Concern  . Not on file   Social History Narrative    Family History  Problem Relation Age of Onset  . Emphysema Brother   . Lung cancer Brother   . Alcohol abuse    . Arthritis    . Cancer    . Macular degeneration    . Heart disease Father   . Hypertension Father   . Heart attack Father   . Colon cancer Sister   . Non-Hodgkin's lymphoma Sister   . Heart disease Brother   . Colon cancer Sister   . Skin cancer Sister   . Lung cancer Daughter     No Known Allergies  Medication list reviewed and updated in full in [facility].   GEN: No acute illnesses, no fevers, chills. GI: No n/v/d, eating normally Pulm: No SOB Interactive and getting along well at home.  Otherwise, ROS is as per the HPI.  Objective:   BP 100/54 mmHg  Pulse 49  Temp(Src) 98.3 F (36.8 C) (Oral)  Ht 5\' 9"  (1.753 m)  Wt 195 lb 12 oz (88.792 kg)  BMI 28.89 kg/m2  GEN: WDWN, NAD, Non-toxic, A & O x 3 HEENT: Atraumatic, Normocephalic. Neck supple. No masses, No LAD. Ears and Nose: No external deformity. CV: RRR, 3/6 SEM. No JVD. No thrill. No extra heart sounds. PULM: CTA B, no wheezes, crackles, rhonchi. No retractions. No resp. distress. No accessory muscle  use. EXTR: No c/c/e NEURO Normal gait.  PSYCH: Normally interactive. Conversant. Not depressed or anxious appearing.  Calm demeanor.   Fairly normal range of motion at the hip bilaterally with both internal range of motion external range of motion with no pain with terminal motion.  Pain at the SI joints bilaterally.  Pain in the posterior pelvic musculature along the pelvic rim and in the erector spinae complex bilaterally from approximately L2-S1.  Laboratory and Imaging Data: Results for orders placed or performed in visit on 10/28/14  Lipid panel  Result Value Ref Range   Cholesterol 124 0 - 200 mg/dL   Triglycerides 77.0 0.0 - 149.0 mg/dL   HDL 51.10 >39.00 mg/dL   VLDL 15.4 0.0 - 40.0 mg/dL   LDL Cholesterol 57 0 - 99 mg/dL   Total CHOL/HDL Ratio 2    NonHDL 72.57   Hepatic function panel  Result Value Ref Range   Total Bilirubin 0.9 0.2 - 1.2 mg/dL   Bilirubin, Direct 0.3 0.0 - 0.3 mg/dL   Alkaline Phosphatase 72 39 - 117 U/L   AST 37 0 - 37 U/L   ALT 29 0 - 53 U/L   Total Protein 7.1 6.0 - 8.3 g/dL   Albumin 4.0 3.5 - 5.2 g/dL  TSH  Result Value Ref Range   TSH 1.15 0.35 - 4.50 uIU/mL  Basic metabolic panel  Result Value Ref Range   Sodium 141 135 - 145 mEq/L   Potassium 4.4 3.5 - 5.1 mEq/L   Chloride 102 96 - 112 mEq/L   CO2 33 (H) 19 - 32 mEq/L   Glucose, Bld 94 70 - 99 mg/dL   BUN 28 (H) 6 - 23 mg/dL   Creatinine, Ser 1.39 0.40 - 1.50 mg/dL   Calcium 9.4 8.4 - 10.5 mg/dL   GFR 51.06 (L) >60.00 mL/min  CBC with Differential/Platelet  Result Value Ref Range   WBC 8.1 4.0 - 10.5 K/uL   RBC 4.66 4.22 - 5.81 Mil/uL   Hemoglobin 16.1 13.0 - 17.0 g/dL   HCT 48.2 39.0 - 52.0 %   MCV 103.3 (H) 78.0 - 100.0 fl   MCHC 33.4 30.0 - 36.0 g/dL   RDW 14.1 11.5 - 15.5 %   Platelets 102.0 (L) 150.0 - 400.0 K/uL   Neutrophils Relative % 58.8 43.0 - 77.0 %   Lymphocytes Relative 28.6 12.0 - 46.0 %   Monocytes Relative 8.8 3.0 - 12.0 %   Eosinophils Relative 3.3 0.0 -  5.0 %   Basophils Relative 0.5 0.0 - 3.0 %   Neutro Abs 4.8 1.4 - 7.7 K/uL   Lymphs Abs 2.3 0.7 - 4.0 K/uL   Monocytes Absolute 0.7 0.1 - 1.0 K/uL   Eosinophils Absolute 0.3 0.0 - 0.7 K/uL   Basophils Absolute 0.0 0.0 - 0.1 K/uL     Assessment and Plan:   Spinal stenosis, lumbar region, with neurogenic claudication  Angina, class II (H[facility])  Macular degeneration (senile) of retina  COPD, minimal-mild  Chronic renal insufficiency, stage III (moderate)  Primarily we talked about his back, and this is a more challenging issue in a 82 year old with balance issues and significant macular degeneration.  I do not want to use any kind of sedating medication in this case secondary to fall risk.  Tylenol scheduled 3 times daily.  If needed we could try some tramadol, half a tablet to 1 tablet p.r.n.  We would have to see how this affects the patient.  Other than his back and his eyes, the patient is actually doing quite well and seems to be well adjusted and mentally with it for 82 years old.  Patient Instructions  Genteal rewetting drops - severe or moderately severe.   Take Tylenol/Acetaminophen ES (500mg ) 2 tabs by mouth three times a day max as needed.      Follow-up: Return in about 6 months (around 11/01/2015) for Medicare Wellness.  Modified Medications   Modified Medication Previous Medication   FLUOXETINE (PROZAC) 10 MG TABLET FLUoxetine (PROZAC) 10 MG tablet  Take 1 tablet (10 mg total) by mouth daily.    Take 1 tablet (10 mg total) by mouth daily.   Signed,  Maud Deed. Haze Antillon, MD   Patient's Medications  New Prescriptions   No medications on file  Previous Medications   ALBUTEROL (PROVENTIL HFA;VENTOLIN HFA) 108 (90 BASE) MCG/ACT INHALER    Inhale 2 puffs into the lungs every 6 (six) hours as needed for wheezing or shortness of breath.   ASPIRIN 325 MG TABLET    Take 325 mg by mouth daily.   B COMPLEX-C-FOLIC ACID (NEPHRO-VITE PO)    Take 1 tablet by mouth  daily.     CHOLECALCIFEROL (VITAMIN D) 1000 UNITS TABLET    Take 1,000 Units by mouth daily.   FUROSEMIDE (LASIX) 40 MG TABLET    TAKE 1 TABLET BY MOUTH EVERY MORNING AND 1/2 TABEVERY EVENING   ISOSORBIDE MONONITRATE (IMDUR) 60 MG 24 HR TABLET    Take 1.5 tablets (90 mg total) by mouth daily.   METOPROLOL SUCCINATE (TOPROL-XL) 25 MG 24 HR TABLET    Take 1 tablet (25 mg total) by mouth daily.   MULTIPLE VITAMINS-MINERALS (RA VISION-VITE PRESERVE PO)    Take 2 capsules by mouth daily.     NAPROXEN SODIUM (ANAPROX) 220 MG TABLET    Take 220 mg by mouth as needed.   NITROGLYCERIN (NITROLINGUAL) 0.4 MG/SPRAY SPRAY    PLACE 1 SPRAY UNDER THE TONGUE EVERY 5 (FIVE) MINUTES AS NEEDED.   POTASSIUM CHLORIDE SA (K-DUR,KLOR-CON) 20 MEQ TABLET    TAKE 1 TABLET BY MOUTH DAILY.   PRAVASTATIN (PRAVACHOL) 40 MG TABLET    TAKE 1 TABLET DAILY  ( CALL TO SCHEDULE AN 6 MONTHS APPOINTMENT FOR FUTURE REFILLS )  Modified Medications   Modified Medication Previous Medication   FLUOXETINE (PROZAC) 10 MG TABLET FLUoxetine (PROZAC) 10 MG tablet      Take 1 tablet (10 mg total) by mouth daily.    Take 1 tablet (10 mg total) by mouth daily.  Discontinued Medications   CHOLECALCIFEROL (VITAMIN D) 400 UNITS TABS    Take 800 Units by mouth.    ISOSORBIDE MONONITRATE (IMDUR) 60 MG 24 HR TABLET    Take 1 1/2 tablets by mouth daily   TRAMADOL (ULTRAM) 50 MG TABLET    Take 0.5-1 tablets (25-50 mg total) by mouth every 6 (six) hours as needed.

## 2015-05-02 NOTE — Progress Notes (Signed)
Pre visit review using our clinic review tool, if applicable. No additional management support is needed unless otherwise documented below in the visit note. 

## 2015-06-20 ENCOUNTER — Inpatient Hospital Stay (HOSPITAL_COMMUNITY): Payer: Medicare Other

## 2015-06-20 ENCOUNTER — Encounter (HOSPITAL_COMMUNITY): Payer: Self-pay | Admitting: *Deleted

## 2015-06-20 ENCOUNTER — Emergency Department (HOSPITAL_COMMUNITY): Payer: Medicare Other

## 2015-06-20 ENCOUNTER — Inpatient Hospital Stay (HOSPITAL_COMMUNITY)
Admission: EM | Admit: 2015-06-20 | Discharge: 2015-06-24 | DRG: 065 | Disposition: A | Discharge status: Rehabilitation Facility | Payer: Medicare Other | Attending: Internal Medicine | Admitting: Internal Medicine

## 2015-06-20 DIAGNOSIS — D696 Thrombocytopenia, unspecified: Secondary | ICD-10-CM | POA: Insufficient documentation

## 2015-06-20 DIAGNOSIS — G8194 Hemiplegia, unspecified affecting left nondominant side: Secondary | ICD-10-CM | POA: Diagnosis present

## 2015-06-20 DIAGNOSIS — Z7982 Long term (current) use of aspirin: Secondary | ICD-10-CM

## 2015-06-20 DIAGNOSIS — E7800 Pure hypercholesterolemia, unspecified: Secondary | ICD-10-CM | POA: Diagnosis present

## 2015-06-20 DIAGNOSIS — Z79899 Other long term (current) drug therapy: Secondary | ICD-10-CM | POA: Diagnosis not present

## 2015-06-20 DIAGNOSIS — H9190 Unspecified hearing loss, unspecified ear: Secondary | ICD-10-CM | POA: Diagnosis present

## 2015-06-20 DIAGNOSIS — I639 Cerebral infarction, unspecified: Secondary | ICD-10-CM | POA: Diagnosis present

## 2015-06-20 DIAGNOSIS — I739 Peripheral vascular disease, unspecified: Secondary | ICD-10-CM | POA: Diagnosis present

## 2015-06-20 DIAGNOSIS — R4189 Other symptoms and signs involving cognitive functions and awareness: Secondary | ICD-10-CM | POA: Diagnosis present

## 2015-06-20 DIAGNOSIS — I10 Essential (primary) hypertension: Secondary | ICD-10-CM | POA: Insufficient documentation

## 2015-06-20 DIAGNOSIS — H04123 Dry eye syndrome of bilateral lacrimal glands: Secondary | ICD-10-CM | POA: Diagnosis not present

## 2015-06-20 DIAGNOSIS — Z87891 Personal history of nicotine dependence: Secondary | ICD-10-CM | POA: Diagnosis not present

## 2015-06-20 DIAGNOSIS — J449 Chronic obstructive pulmonary disease, unspecified: Secondary | ICD-10-CM | POA: Diagnosis present

## 2015-06-20 DIAGNOSIS — D7589 Other specified diseases of blood and blood-forming organs: Secondary | ICD-10-CM

## 2015-06-20 DIAGNOSIS — F0390 Unspecified dementia without behavioral disturbance: Secondary | ICD-10-CM | POA: Diagnosis present

## 2015-06-20 DIAGNOSIS — K5900 Constipation, unspecified: Secondary | ICD-10-CM | POA: Diagnosis present

## 2015-06-20 DIAGNOSIS — N183 Chronic kidney disease, stage 3 unspecified: Secondary | ICD-10-CM | POA: Diagnosis present

## 2015-06-20 DIAGNOSIS — N289 Disorder of kidney and ureter, unspecified: Secondary | ICD-10-CM

## 2015-06-20 DIAGNOSIS — R17 Unspecified jaundice: Secondary | ICD-10-CM | POA: Diagnosis present

## 2015-06-20 DIAGNOSIS — W19XXXA Unspecified fall, initial encounter: Secondary | ICD-10-CM

## 2015-06-20 DIAGNOSIS — I2510 Atherosclerotic heart disease of native coronary artery without angina pectoris: Secondary | ICD-10-CM | POA: Diagnosis present

## 2015-06-20 DIAGNOSIS — F329 Major depressive disorder, single episode, unspecified: Secondary | ICD-10-CM | POA: Diagnosis present

## 2015-06-20 DIAGNOSIS — I5032 Chronic diastolic (congestive) heart failure: Secondary | ICD-10-CM | POA: Diagnosis present

## 2015-06-20 DIAGNOSIS — F1010 Alcohol abuse, uncomplicated: Secondary | ICD-10-CM | POA: Insufficient documentation

## 2015-06-20 DIAGNOSIS — Z8546 Personal history of malignant neoplasm of prostate: Secondary | ICD-10-CM | POA: Diagnosis not present

## 2015-06-20 DIAGNOSIS — K219 Gastro-esophageal reflux disease without esophagitis: Secondary | ICD-10-CM | POA: Diagnosis present

## 2015-06-20 DIAGNOSIS — Z85828 Personal history of other malignant neoplasm of skin: Secondary | ICD-10-CM

## 2015-06-20 DIAGNOSIS — Z951 Presence of aortocoronary bypass graft: Secondary | ICD-10-CM | POA: Diagnosis not present

## 2015-06-20 DIAGNOSIS — R001 Bradycardia, unspecified: Secondary | ICD-10-CM | POA: Insufficient documentation

## 2015-06-20 DIAGNOSIS — I130 Hypertensive heart and chronic kidney disease with heart failure and stage 1 through stage 4 chronic kidney disease, or unspecified chronic kidney disease: Secondary | ICD-10-CM | POA: Diagnosis present

## 2015-06-20 DIAGNOSIS — I6340 Cerebral infarction due to embolism of unspecified cerebral artery: Secondary | ICD-10-CM | POA: Diagnosis not present

## 2015-06-20 DIAGNOSIS — I6789 Other cerebrovascular disease: Secondary | ICD-10-CM | POA: Diagnosis not present

## 2015-06-20 DIAGNOSIS — M4806 Spinal stenosis, lumbar region: Secondary | ICD-10-CM | POA: Diagnosis present

## 2015-06-20 DIAGNOSIS — N2889 Other specified disorders of kidney and ureter: Secondary | ICD-10-CM | POA: Diagnosis present

## 2015-06-20 DIAGNOSIS — H3530 Unspecified macular degeneration: Secondary | ICD-10-CM | POA: Diagnosis present

## 2015-06-20 DIAGNOSIS — F1020 Alcohol dependence, uncomplicated: Secondary | ICD-10-CM | POA: Diagnosis present

## 2015-06-20 DIAGNOSIS — R531 Weakness: Secondary | ICD-10-CM | POA: Diagnosis not present

## 2015-06-20 DIAGNOSIS — I6522 Occlusion and stenosis of left carotid artery: Secondary | ICD-10-CM | POA: Diagnosis present

## 2015-06-20 DIAGNOSIS — I350 Nonrheumatic aortic (valve) stenosis: Secondary | ICD-10-CM | POA: Diagnosis present

## 2015-06-20 DIAGNOSIS — I495 Sick sinus syndrome: Secondary | ICD-10-CM | POA: Diagnosis present

## 2015-06-20 DIAGNOSIS — N189 Chronic kidney disease, unspecified: Secondary | ICD-10-CM | POA: Diagnosis not present

## 2015-06-20 DIAGNOSIS — I25810 Atherosclerosis of coronary artery bypass graft(s) without angina pectoris: Secondary | ICD-10-CM | POA: Insufficient documentation

## 2015-06-20 LAB — I-STAT TROPONIN, ED: Troponin i, poc: 0.02 ng/mL (ref 0.00–0.08)

## 2015-06-20 LAB — DIFFERENTIAL
BASOS ABS: 0 10*3/uL (ref 0.0–0.1)
BASOS PCT: 0 %
Eosinophils Absolute: 0.3 10*3/uL (ref 0.0–0.7)
Eosinophils Relative: 4 %
LYMPHS PCT: 18 %
Lymphs Abs: 1.3 10*3/uL (ref 0.7–4.0)
MONO ABS: 0.8 10*3/uL (ref 0.1–1.0)
Monocytes Relative: 10 %
Neutro Abs: 4.8 10*3/uL (ref 1.7–7.7)
Neutrophils Relative %: 67 %

## 2015-06-20 LAB — COMPREHENSIVE METABOLIC PANEL
ALK PHOS: 68 U/L (ref 38–126)
ALT: 43 U/L (ref 17–63)
AST: 46 U/L — AB (ref 15–41)
Albumin: 3.6 g/dL (ref 3.5–5.0)
Anion gap: 5 (ref 5–15)
BUN: 25 mg/dL — AB (ref 6–20)
CALCIUM: 9.2 mg/dL (ref 8.9–10.3)
CHLORIDE: 105 mmol/L (ref 101–111)
CO2: 29 mmol/L (ref 22–32)
CREATININE: 1.5 mg/dL — AB (ref 0.61–1.24)
GFR calc Af Amer: 45 mL/min — ABNORMAL LOW (ref >60)
GFR calc non Af Amer: 39 mL/min — ABNORMAL LOW (ref >60)
GLUCOSE: 117 mg/dL — AB (ref 65–99)
Potassium: 4.3 mmol/L (ref 3.5–5.1)
SODIUM: 139 mmol/L (ref 135–145)
Total Bilirubin: 1.5 mg/dL — ABNORMAL HIGH (ref 0.3–1.2)
Total Protein: 6.4 g/dL — ABNORMAL LOW (ref 6.5–8.1)

## 2015-06-20 LAB — I-STAT CHEM 8, ED
BUN: 27 mg/dL — ABNORMAL HIGH (ref 6–20)
CHLORIDE: 101 mmol/L (ref 101–111)
CREATININE: 1.4 mg/dL — AB (ref 0.61–1.24)
Calcium, Ion: 1.15 mmol/L (ref 1.13–1.30)
GLUCOSE: 109 mg/dL — AB (ref 65–99)
HEMATOCRIT: 46 % (ref 39.0–52.0)
HEMOGLOBIN: 15.6 g/dL (ref 13.0–17.0)
POTASSIUM: 4.2 mmol/L (ref 3.5–5.1)
Sodium: 140 mmol/L (ref 135–145)
TCO2: 27 mmol/L (ref 0–100)

## 2015-06-20 LAB — CBC
HCT: 45.6 % (ref 39.0–52.0)
Hemoglobin: 14.9 g/dL (ref 13.0–17.0)
MCH: 34.3 pg — ABNORMAL HIGH (ref 26.0–34.0)
MCHC: 32.7 g/dL (ref 30.0–36.0)
MCV: 105.1 fL — AB (ref 78.0–100.0)
PLATELETS: 87 10*3/uL — AB (ref 150–400)
RBC: 4.34 MIL/uL (ref 4.22–5.81)
RDW: 13.6 % (ref 11.5–15.5)
WBC: 7.2 10*3/uL (ref 4.0–10.5)

## 2015-06-20 LAB — RAPID URINE DRUG SCREEN, HOSP PERFORMED
Amphetamines: NOT DETECTED
BARBITURATES: NOT DETECTED
Benzodiazepines: NOT DETECTED
COCAINE: NOT DETECTED
Opiates: NOT DETECTED
Tetrahydrocannabinol: NOT DETECTED

## 2015-06-20 LAB — ETHANOL

## 2015-06-20 LAB — APTT: aPTT: 33 seconds (ref 24–37)

## 2015-06-20 LAB — CBG MONITORING, ED: Glucose-Capillary: 94 mg/dL (ref 65–99)

## 2015-06-20 LAB — PROTIME-INR
INR: 1.11 (ref 0.00–1.49)
PROTHROMBIN TIME: 14.5 s (ref 11.6–15.2)

## 2015-06-20 MED ORDER — VITAMIN B-1 100 MG PO TABS
100.0000 mg | ORAL_TABLET | Freq: Every day | ORAL | Status: DC
  Administered 2015-06-20 – 2015-06-24 (×5): 100 mg via ORAL
  Filled 2015-06-20 (×6): qty 1

## 2015-06-20 MED ORDER — ALBUTEROL SULFATE (2.5 MG/3ML) 0.083% IN NEBU: 2.5000 mg | INHALATION_SOLUTION | Freq: Four times a day (QID) | RESPIRATORY_TRACT | Status: DC | PRN

## 2015-06-20 MED ORDER — LORAZEPAM 2 MG/ML IJ SOLN: 1.0000 mg | Freq: Four times a day (QID) | INTRAMUSCULAR | Status: DC | PRN

## 2015-06-20 MED ORDER — THIAMINE HCL 100 MG/ML IJ SOLN
100.0000 mg | Freq: Every day | INTRAMUSCULAR | Status: DC
  Filled 2015-06-20 (×3): qty 2

## 2015-06-20 MED ORDER — PRAVASTATIN SODIUM 40 MG PO TABS
40.0000 mg | ORAL_TABLET | Freq: Every day | ORAL | Status: DC
  Administered 2015-06-20 – 2015-06-24 (×5): 40 mg via ORAL
  Filled 2015-06-20 (×5): qty 1

## 2015-06-20 MED ORDER — LORAZEPAM 0.5 MG PO TABS
0.5000 mg | ORAL_TABLET | Freq: Once | ORAL | Status: AC
  Administered 2015-06-20: 0.5 mg via ORAL
  Filled 2015-06-20: qty 1

## 2015-06-20 MED ORDER — ASPIRIN 325 MG PO TABS
325.0000 mg | ORAL_TABLET | Freq: Every day | ORAL | Status: DC
  Administered 2015-06-20 – 2015-06-24 (×5): 325 mg via ORAL
  Filled 2015-06-20 (×5): qty 1

## 2015-06-20 MED ORDER — HYDRALAZINE HCL 20 MG/ML IJ SOLN: 5.0000 mg | Freq: Three times a day (TID) | INTRAMUSCULAR | Status: DC | PRN

## 2015-06-20 MED ORDER — FLUOXETINE HCL 10 MG PO CAPS
10.0000 mg | ORAL_CAPSULE | Freq: Every day | ORAL | Status: DC
  Administered 2015-06-21 – 2015-06-24 (×4): 10 mg via ORAL
  Filled 2015-06-20 (×7): qty 1

## 2015-06-20 MED ORDER — POTASSIUM CHLORIDE CRYS ER 20 MEQ PO TBCR
20.0000 meq | EXTENDED_RELEASE_TABLET | Freq: Every day | ORAL | Status: DC
  Administered 2015-06-21 – 2015-06-24 (×4): 20 meq via ORAL
  Filled 2015-06-20 (×4): qty 1

## 2015-06-20 MED ORDER — FUROSEMIDE 40 MG PO TABS
40.0000 mg | ORAL_TABLET | Freq: Every day | ORAL | Status: DC
  Administered 2015-06-21 – 2015-06-24 (×4): 40 mg via ORAL
  Filled 2015-06-20 (×4): qty 1

## 2015-06-20 MED ORDER — SODIUM CHLORIDE 0.9 % IV SOLN
INTRAVENOUS | Status: DC
  Administered 2015-06-20 – 2015-06-21 (×2): via INTRAVENOUS

## 2015-06-20 MED ORDER — RENA-VITE PO TABS
1.0000 | ORAL_TABLET | Freq: Every day | ORAL | Status: DC
  Administered 2015-06-20 – 2015-06-24 (×5): 1 via ORAL
  Filled 2015-06-20 (×6): qty 1

## 2015-06-20 MED ORDER — ASPIRIN 300 MG RE SUPP: 300.0000 mg | Freq: Every day | RECTAL | Status: DC

## 2015-06-20 MED ORDER — SENNOSIDES-DOCUSATE SODIUM 8.6-50 MG PO TABS: 1.0000 | ORAL_TABLET | Freq: Every evening | ORAL | Status: DC | PRN

## 2015-06-20 MED ORDER — STROKE: EARLY STAGES OF RECOVERY BOOK
Freq: Once | Status: AC
  Administered 2015-06-20: 17:00:00
  Filled 2015-06-20: qty 1

## 2015-06-20 MED ORDER — ALBUTEROL SULFATE HFA 108 (90 BASE) MCG/ACT IN AERS: 2.0000 | INHALATION_SPRAY | Freq: Four times a day (QID) | RESPIRATORY_TRACT | Status: DC | PRN

## 2015-06-20 MED ORDER — LORAZEPAM 1 MG PO TABS: 1.0000 mg | ORAL_TABLET | Freq: Four times a day (QID) | ORAL | Status: DC | PRN

## 2015-06-20 MED ORDER — SODIUM CHLORIDE 0.9 % IV SOLN: INTRAVENOUS | Status: AC

## 2015-06-20 NOTE — ED Notes (Addendum)
Pt here via [facility] for L sided deficits since yesterday when waking up from his nap.  Fall x 2 since symptoms started.  Has had to use walker.

## 2015-06-20 NOTE — Consult Note (Signed)
Requesting Physician: Dr. Eulis Foster    Chief Complaint: CVA  History obtained from:  Patient     HPI:                                                                                                                                         Andrew [name] is an 82 y.o. male who presents for evaluation of difficulty moving left Lower extremity weakness resulting in falls, 2 yesterday.  While in the ED he obtained MRI which revealed a acute right IC infarct . He has 1-39 right ICA stenosis and left ICA stenosis of 60-79% on the left. Currently his main complaint is left leg weakness. Neurology was asked to evaluate.   Date last known well: Unable to determine Time last known well: Unable to determine tPA Given: No: out of window   Past Medical History  Diagnosis Date  . HYPERCHOLESTEROLEMIA   . CAD, ARTERY BYPASS GRAFT   . CAROTID ARTERY STENOSIS 03/07/2010    80%  . AORTIC STENOSIS   . GASTROESOPHAGEAL REFLUX DISEASE   . Skin cancer   . History of colonic polyps     1999, 2004  . Allergic rhinitis   . Thyroid nodule 05/2010    Abnormal biopsy, 82 year old patient and his wife have made informed decision to not pursue surgical resection. Potential risk including cancer has been thoroughly discussed with the patient.  . S/P excision of acoustic neuroma   . History of prostate cancer 2002    s/p treatment with seeds / radiation  . Lumbar spinal stenosis 07/13/2009  . COPD, mild ([place]) 12/22/2010  . Pancreatic cyst 01/29/2013    Noted on MRI scan from [facility] on 07/27/2011.  I reviewed report at patient request on 01-29-2013.  "Small cystic focus in the posterior pancreatic head.  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  Follow up MRI is advised. The main pancreatic duct remains normal in appearance."  Patient do  . CHF (congestive heart failure) ([place])   . Macular degeneration (senile) of  retina 04/29/2014    Past Surgical History  Procedure Laterality Date  . Coronary artery bypass graft    . Transperineal implatation of palladium    . Cholecystectomy    . Shoulder surgery    . Total knee arthroplasty    . Cataract extraction    . Tonsillectomy and adenoidectomy  1932  . Craniectomy for excision of acoustic neuroma  1985  . Cataract extraction  2010  . Eye surgery      Family History  Problem Relation Age of Onset  . Emphysema Brother   . Lung cancer Brother   . Alcohol abuse    . Arthritis    . Cancer    . Macular degeneration    . Heart disease  Father   . Hypertension Father   . Heart attack Father   . Colon cancer Sister   . Non-Hodgkin's lymphoma Sister   . Heart disease Brother   . Colon cancer Sister   . Skin cancer Sister   . Lung cancer Daughter    Social History:  reports that he quit smoking about 54 years ago. His smoking use included Cigarettes. He has a 40 pack-year smoking history. He has never used smokeless tobacco. He reports that he drinks about 4.2 oz of alcohol per week. He reports that he does not use illicit drugs.  Allergies: No Known Allergies  Medications:                                                                                                                           No current facility-administered medications for this encounter.   Current Outpatient Prescriptions  Medication Sig Dispense Refill  . albuterol (PROVENTIL HFA;VENTOLIN HFA) 108 (90 BASE) MCG/ACT inhaler Inhale 2 puffs into the lungs every 6 (six) hours as needed for wheezing or shortness of breath. 1 Inhaler 3  . aspirin 325 MG tablet Take 325 mg by mouth daily.    . B Complex-C-Folic Acid (NEPHRO-VITE PO) Take 1 tablet by mouth daily.      . cholecalciferol (VITAMIN D) 1000 units tablet Take 1,000 Units by mouth daily.    [name] 10 MG tablet Take 1 tablet (10 mg total) by mouth daily. 90 tablet 1  . furosemide (LASIX) 40 MG tablet TAKE 1  TABLET BY MOUTH EVERY MORNING AND 1/2 TABEVERY EVENING 135 tablet 3  . isosorbide mononitrate (IMDUR) 60 MG 24 hr tablet Take 1.5 tablets (90 mg total) by mouth daily. 135 tablet 3  . metoprolol succinate (TOPROL-XL) 25 MG 24 hr tablet Take 1 tablet (25 mg total) by mouth daily. 90 tablet 3  . Multiple Vitamins-Minerals (RA VISION-VITE PRESERVE PO) Take 2 capsules by mouth daily.      . naproxen sodium (ANAPROX) 220 MG tablet Take 220 mg by mouth as needed.    . nitroGLYCERIN (NITROLINGUAL) 0.4 MG/SPRAY spray PLACE 1 SPRAY UNDER THE TONGUE EVERY 5 (FIVE) MINUTES AS NEEDED. 4.9 g 12  . potassium chloride SA (K-DUR,KLOR-CON) 20 MEQ tablet TAKE 1 TABLET BY MOUTH DAILY. 90 tablet 2  . pravastatin (PRAVACHOL) 40 MG tablet TAKE 1 TABLET DAILY  ( CALL TO SCHEDULE AN 6 MONTHS APPOINTMENT FOR FUTURE REFILLS ) 90 tablet 3     ROS:  History obtained from the patient  General ROS: negative for - chills, fatigue, fever, night sweats, weight gain or weight loss Psychological ROS: negative for - behavioral disorder, hallucinations, memory difficulties, mood swings or suicidal ideation Ophthalmic ROS: negative for - blurry vision, double vision, eye pain or loss of vision ENT ROS: negative for - epistaxis, nasal discharge, oral lesions, sore throat, tinnitus or vertigo Allergy and Immunology ROS: negative for - hives or itchy/watery eyes Hematological and Lymphatic ROS: negative for - bleeding problems, bruising or swollen lymph nodes Endocrine ROS: negative for - galactorrhea, hair pattern changes, polydipsia/polyuria or temperature intolerance Respiratory ROS: negative for - cough, hemoptysis, shortness of breath or wheezing Cardiovascular ROS: negative for - chest pain, dyspnea on exertion, edema or irregular heartbeat Gastrointestinal ROS: negative for - abdominal pain,  diarrhea, hematemesis, nausea/vomiting or stool incontinence Genito-Urinary ROS: negative for - dysuria, hematuria, incontinence or urinary frequency/urgency Musculoskeletal ROS: negative for - joint swelling or muscular weakness Neurological ROS: as noted in HPI Dermatological ROS: negative for rash and skin lesion changes  Neurologic Examination:                                                                                                      Blood pressure 118/67, pulse 52, temperature 98.9 F (37.2 C), temperature source Oral, resp. rate 18, height 5' 9.5" (1.765 m), weight 88.451 kg (195 lb), SpO2 97 %.  HEENT-  Normocephalic, no lesions, without obvious abnormality.  Normal external eye and conjunctiva.  Normal TM's bilaterally.  Normal auditory canals and external ears. Normal external nose, mucus membranes and septum.  Normal pharynx. Cardiovascular- S1, S2 normal, pulses palpable throughout   Lungs- chest clear, no wheezing, rales, normal symmetric air entry Abdomen- normal findings: bowel sounds normal Extremities- no edema Lymph-no adenopathy palpable Musculoskeletal-no joint tenderness, deformity or swelling Skin-warm and dry, no hyperpigmentation, vitiligo, or suspicious lesions  Neurological Examination Mental Status: Alert, oriented, thought content appropriate.  Speech fluent without evidence of aphasia.  Able to follow 3 step commands without difficulty. 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, facial light touch sensation normal bilaterally VIII: hearing normal bilaterally IX,X: uvula rises symmetrically XI: bilateral shoulder shrug XII: midline tongue extension Motor: Right : Upper extremity   5/5    Left:     Upper extremity   5/5  Lower extremity   5/5     Lower  extremity   4/5 Tone and bulk:normal tone throughout; no atrophy noted Sensory: Pinprick and light touch intact throughout, bilaterally Deep Tendon Reflexes: 1+ and symmetric throughout Plantars: Right: downgoing   Left: downgoing Cerebellar: normal finger-to-nose,  and normal heel-to-shin test on the right with difficulty on the left Gait: not tested       Lab Results: Basic Metabolic Panel:  Recent Labs Lab 06/20/15 1032 06/20/15 1058  NA 139 140  K 4.3 4.2  CL 105 101  CO2 29  --  GLUCOSE 117* 109*  BUN 25* 27*  CREATININE 1.50* 1.40*  CALCIUM 9.2  --     Liver Function Tests:  Recent Labs Lab 06/20/15 1032  AST 46*  ALT 43  ALKPHOS 68  BILITOT 1.5*  PROT 6.4*  ALBUMIN 3.6   No results for input(s): LIPASE, AMYLASE in the last 168 hours. No results for input(s): AMMONIA in the last 168 hours.  CBC:  Recent Labs Lab 06/20/15 1032 06/20/15 1058  WBC 7.2  --   NEUTROABS 4.8  --   HGB 14.9 15.6  HCT 45.6 46.0  MCV 105.1*  --   PLT 87*  --     Cardiac Enzymes: No results for input(s): CKTOTAL, CKMB, CKMBINDEX, TROPONINI in the last 168 hours.  Lipid Panel: No results for input(s): CHOL, TRIG, HDL, CHOLHDL, VLDL, LDLCALC in the last 168 hours.  CBG:  Recent Labs Lab 06/20/15 1040  GLUCAP 94    Microbiology: No results found for this or any previous visit.  Coagulation Studies:  Recent Labs  06/20/15 1032  LABPROT 14.5  INR 1.11    Imaging: Mr Brain Wo Contrast (neuro Protocol)  06/20/2015  CLINICAL DATA:  82 year old male with left-sided deficits since yesterday. Fall twice since episode. History of vestibular schwannoma resection 1985. Initial encounter. EXAM: MRI HEAD WITHOUT CONTRAST TECHNIQUE: Multiplanar, multiecho pulse sequences of the brain and surrounding structures were obtained without intravenous contrast. COMPARISON:  05/04/2009. FINDINGS: Small acute nonhemorrhagic infarct posterior limb right internal capsule.  Question tiny medial left parietal lobe infarct. Marked chronic microvascular changes. Global moderate atrophy without hydrocephalus. No intracranial hemorrhage. Postsurgical changes left mastoid region. No obvious recurrent mass. Major intracranial vascular structures are patent. Narrowing left vertebral artery suspected. Post lens replacement. Paranasal sinus mucosal thickening/opacification most notable right maxillary sinus. Cervical medullary junction within normal limits. IMPRESSION: Small acute nonhemorrhagic infarct posterior limb right internal capsule. Question tiny medial left parietal lobe infarct. Marked chronic microvascular changes. Global moderate atrophy without hydrocephalus. No intracranial hemorrhage. Postsurgical changes left mastoid region. No obvious recurrent mass. Major intracranial vascular structures are patent. Narrowing left vertebral artery suspected. Paranasal sinus mucosal thickening/opacification most notable right maxillary sinus. Electronically Signed   By: Genia Del M.D.   On: 06/20/2015 12:38       Assessment and plan discussed with with attending physician and they are in agreement.    Etta Quill PA-C [facility] [phone]  06/20/2015, 1:11 PM   Assessment: 82 y.o. male with acute punctate right IC stroke. Patient has known ICA stenosis and is followed by [facility]. Las Carotid was 5 months ago and shows left carotid stenosis which would not correlate with today's stroke. Will need this to be reassessed. Will also need a full stroke work up. Patient will be followed by Dr. Leonie Man and Chyrl Civatte NP tomorrow.   Stroke Risk Factors - carotid stenosis, hyperlipidemia and hypertension, CAD   Recommend: 1. HgbA1c, fasting lipid panel 2. MRI, MRA  of the brain without contrast 3. PT consult, OT consult, Speech consult 4. Echocardiogram 5. Carotid dopplers 6. Prophylactic therapy-Antiplatelet med: Plavix - dose 75 mg daily 7. Risk factor  modification 8. Telemetry monitoring 9. Frequent neuro checks 10 NPO until passes stroke swallow screen 11 please page stroke NP  Or  PA  Or MD from 8am -4 pm  as this patient from this time will be  followed by the stroke.   You can look them up on [email or web address]  Password [email or web address]

## 2015-06-20 NOTE — ED Notes (Signed)
Checked  patient  blood sugar it was 94 notified RN Greg of blood sugar

## 2015-06-20 NOTE — Progress Notes (Signed)
Pt reports he drinks 1 drink of bourbon every night. Unsure if the amount is greater than a shot, because pt "measures with fingers"" in a regular glass. Pt and family deny that pt goes into withdrawal if he does not drink, but pt has drank for years.

## 2015-06-20 NOTE — Progress Notes (Signed)
Pt to MRI/ MRA

## 2015-06-20 NOTE — ED Provider Notes (Signed)
CSN: [id number]     Arrival date & time 06/20/15  1005 History   First MD Initiated Contact with Patient 06/20/15 1011     Chief Complaint  Patient presents with  . Weakness    L sided     (Consider location/radiation/quality/duration/timing/severity/associated sxs/prior Treatment) HPI   Andrew [name] is a 82 y.o. male who presents for evaluation of difficulty moving left side resulting in falls, 2 yesterday. No dysarthria and aphasia or isolated weakness has been appreciated. He denies headache, fever, chills, nausea, vomiting, cough, shortness of breath or chest pain. He is taking his usual medications. Symptoms onset yesterday. During the afternoon following in that. He first noticed retropulsion when attempting to stand from a nap. Then later he fell while he walking with his wife, striking his left chest on a walker. No prior similar problems. There are no other known modifying factors.   Past Medical History  Diagnosis Date  . HYPERCHOLESTEROLEMIA   . CAD, ARTERY BYPASS GRAFT   . CAROTID ARTERY STENOSIS 03/07/2010    80%  . AORTIC STENOSIS   . GASTROESOPHAGEAL REFLUX DISEASE   . Skin cancer   . History of colonic polyps     1999, 2004  . Allergic rhinitis   . Thyroid nodule 05/2010    Abnormal biopsy, 82 year old patient and his wife have made informed decision to not pursue surgical resection. Potential risk including cancer has been thoroughly discussed with the patient.  . S/P excision of acoustic neuroma   . History of prostate cancer 2002    s/p treatment with seeds / radiation  . Lumbar spinal stenosis 07/13/2009  . COPD, mild ([place]) 12/22/2010  . Pancreatic cyst 01/29/2013    Noted on MRI scan from [facility] on 07/27/2011.  I reviewed report at patient request on 01-29-2013.  "Small cystic focus in the posterior pancreatic head.  Imaging features are not entirely specific, though given the patient demographics favored to represent a small sidebranch  IPMN.  Follow up MRI is advised. The main pancreatic duct remains normal in appearance."  Patient do  . CHF (congestive heart failure) ([place])   . Macular degeneration (senile) of retina 04/29/2014   Past Surgical History  Procedure Laterality Date  . Coronary artery bypass graft    . Transperineal implatation of palladium    . Cholecystectomy    . Shoulder surgery    . Total knee arthroplasty    . Cataract extraction    . Tonsillectomy and adenoidectomy  1932  . Craniectomy for excision of acoustic neuroma  1985  . Cataract extraction  2010  . Eye surgery     Family History  Problem Relation Age of Onset  . Emphysema Brother   . Lung cancer Brother   . Alcohol abuse    . Arthritis    . Cancer    . Macular degeneration    . Heart disease Father   . Hypertension Father   . Heart attack Father   . Colon cancer Sister   . Non-Hodgkin's lymphoma Sister   . Heart disease Brother   . Colon cancer Sister   . Skin cancer Sister   . Lung cancer Daughter    Social History  Substance Use Topics  . Smoking status: Former Smoker -- 2.00 packs/day for 20 years    Types: Cigarettes    Quit date: 01/22/1961  . Smokeless tobacco: Never Used  . Alcohol Use: 4.2 oz/week    7  Shots of liquor per week     Comment: 1 drink nightly    Review of Systems  All other systems reviewed and are negative.     Allergies  Review of patient's allergies indicates no known allergies.  Home Medications   Prior to Admission medications   Medication Sig Start Date End Date Taking? Authorizing Provider  albuterol (PROVENTIL HFA;VENTOLIN HFA) 108 (90 BASE) MCG/ACT inhaler Inhale 2 puffs into the lungs every 6 (six) hours as needed for wheezing or shortness of breath. 05/30/13   Orma Flaming, MD  aspirin 325 MG tablet Take 325 mg by mouth daily.    Historical Provider, MD  B Complex-C-Folic Acid (NEPHRO-VITE PO) Take 1 tablet by mouth daily.      Historical Provider, MD  cholecalciferol (VITAMIN D)  1000 units tablet Take 1,000 Units by mouth daily.    Historical Provider, MD  FLUoxetine (PROZAC) 10 MG tablet Take 1 tablet (10 mg total) by mouth daily. 05/02/15   [name], MD  furosemide (LASIX) 40 MG tablet TAKE 1 TABLET BY MOUTH EVERY MORNING AND 1/2 TABEVERY EVENING 02/21/15   Larey Dresser, MD  isosorbide mononitrate (IMDUR) 60 MG 24 hr tablet Take 1.5 tablets (90 mg total) by mouth daily. 01/10/15   Larey Dresser, MD  metoprolol succinate (TOPROL-XL) 25 MG 24 hr tablet Take 1 tablet (25 mg total) by mouth daily. 01/07/15   Larey Dresser, MD  Multiple Vitamins-Minerals (RA VISION-VITE PRESERVE PO) Take 2 capsules by mouth daily.      Historical Provider, MD  naproxen sodium (ANAPROX) 220 MG tablet Take 220 mg by mouth as needed.    Historical Provider, MD  nitroGLYCERIN (NITROLINGUAL) 0.4 MG/SPRAY spray PLACE 1 SPRAY UNDER THE TONGUE EVERY 5 (FIVE) MINUTES AS NEEDED. 07/13/14   Larey Dresser, MD  potassium chloride SA (K-DUR,KLOR-CON) 20 MEQ tablet TAKE 1 TABLET BY MOUTH DAILY. 02/08/15   Larey Dresser, MD  pravastatin (PRAVACHOL) 40 MG tablet TAKE 1 TABLET DAILY  ( CALL TO SCHEDULE AN 6 MONTHS APPOINTMENT FOR FUTURE REFILLS ) 04/19/15   Larey Dresser, MD   BP 118/67 mmHg  Pulse 52  Temp(Src) 98.9 F (37.2 C) (Oral)  Resp 18  Ht 5' 9.5" (1.765 m)  Wt 195 lb (88.451 kg)  BMI 28.39 kg/m2  SpO2 97% Physical Exam  Constitutional: He is oriented to person, place, and time. He appears well-developed. No distress.  Elderly, frail  HENT:  Head: Normocephalic and atraumatic.  Right Ear: External ear normal.  Left Ear: External ear normal.  Eyes: Conjunctivae and EOM are normal. Pupils are equal, round, and reactive to light.  Neck: Normal range of motion and phonation normal. Neck supple.  Cardiovascular: Normal rate, regular rhythm and normal heart sounds.   Pulmonary/Chest: Effort normal and breath sounds normal. He exhibits no bony tenderness.  Abdominal: Soft. There  is no tenderness.  Musculoskeletal: Normal range of motion.  Neurological: He is alert and oriented to person, place, and time. No cranial nerve deficit or sensory deficit. He exhibits normal muscle tone. Coordination normal.  No dysarthria or aphasia. Mild left-sided dysmetria, and poor performance of heel-to-shin, left side. No facial asymmetry. Normal strength, arms and legs bilaterally.  Skin: Skin is warm, dry and intact.  Psychiatric: He has a normal mood and affect. His behavior is normal. Judgment and thought content normal.  Nursing note and vitals reviewed.   ED Course  Procedures (including critical care time)  Initial clinical  impression- patient presents for evaluation of left sided weakness, with apparent subsequent falls.  Medications - No data to display  Patient Vitals for the past 24 hrs:  BP Temp Temp src Pulse Resp SpO2 Height Weight  06/20/15 1240 118/67 mmHg - - (!) 52 18 97 % - -  06/20/15 1130 137/56 mmHg - - (!) 50 17 97 % - -  06/20/15 1120 - 98.9 F (37.2 C) - - - - - -  06/20/15 1100 139/57 mmHg - - (!) 52 18 96 % - -  06/20/15 1043 126/74 mmHg - - (!) 52 20 97 % - -  06/20/15 1007 139/90 mmHg 99.6 F (37.6 C) Oral (!) 57 18 98 % 5' 9.5" (1.765 m) 195 lb (88.451 kg)    12:52- consult to neuro-hospitalist for evaluation of acute CVA, out of window of consideration for thrombolysis. Case discussed, will see as consultant   12:53 PM-Consult complete with hospitalist. Patient case explained and discussed. They agree to admit patient for further evaluation and treatment. Call ended at 82   1:11 PM Reevaluation with update and discussion. After initial assessment and treatment, an updated evaluation reveals a change in clinical status. Patient family updated on findings and plans. They are agreeable. Norm Wray L      Labs Review Labs Reviewed  CBC - Abnormal; Notable for the following:    MCV 105.1 (*)    MCH 34.3 (*)    Platelets 87 (*)    All  other components within normal limits  COMPREHENSIVE METABOLIC PANEL - Abnormal; Notable for the following:    Glucose, Bld 117 (*)    BUN 25 (*)    Creatinine, Ser 1.50 (*)    Total Protein 6.4 (*)    AST 46 (*)    Total Bilirubin 1.5 (*)    GFR calc non Af Amer 39 (*)    GFR calc Af Amer 45 (*)    All other components within normal limits  I-STAT CHEM 8, ED - Abnormal; Notable for the following:    BUN 27 (*)    Creatinine, Ser 1.40 (*)    Glucose, Bld 109 (*)    All other components within normal limits  PROTIME-INR  APTT  DIFFERENTIAL  I-STAT TROPOININ, ED  CBG MONITORING, ED    Imaging Review Mr Brain Wo Contrast (neuro Protocol)  06/20/2015  CLINICAL DATA:  82 year old male with left-sided deficits since yesterday. Fall twice since episode. History of vestibular schwannoma resection 1985. Initial encounter. EXAM: MRI HEAD WITHOUT CONTRAST TECHNIQUE: Multiplanar, multiecho pulse sequences of the brain and surrounding structures were obtained without intravenous contrast. COMPARISON:  05/04/2009. FINDINGS: Small acute nonhemorrhagic infarct posterior limb right internal capsule. Question tiny medial left parietal lobe infarct. Marked chronic microvascular changes. Global moderate atrophy without hydrocephalus. No intracranial hemorrhage. Postsurgical changes left mastoid region. No obvious recurrent mass. Major intracranial vascular structures are patent. Narrowing left vertebral artery suspected. Post lens replacement. Paranasal sinus mucosal thickening/opacification most notable right maxillary sinus. Cervical medullary junction within normal limits. IMPRESSION: Small acute nonhemorrhagic infarct posterior limb right internal capsule. Question tiny medial left parietal lobe infarct. Marked chronic microvascular changes. Global moderate atrophy without hydrocephalus. No intracranial hemorrhage. Postsurgical changes left mastoid region. No obvious recurrent mass. Major intracranial  vascular structures are patent. Narrowing left vertebral artery suspected. Paranasal sinus mucosal thickening/opacification most notable right maxillary sinus. Electronically Signed   By: Genia Del M.D.   On: 06/20/2015 12:38   I have personally reviewed  and evaluated these images and lab results as part of my medical decision-making.   EKG Interpretation   Date/Time:  Monday Jun 20 2015 10:08:56 EDT Ventricular Rate:  54 PR Interval:  184 QRS Duration: 131 QT Interval:  451 QTC Calculation: 427 R Axis:   -54 Text Interpretation:  Sinus rhythm Nonspecific IVCD with LAD Nonspecific T  abnormalities, inferior leads Baseline wander in lead(s) V2 since last  tracing no significant change Confirmed by Eulis Foster  MD, Vira Agar [id number]) on  06/20/2015 10:11:59 AM Also confirmed by Eulis Foster  MD, Dara Beidleman [phone]), editor  Stout CT, Leda Gauze [phone])  on 06/20/2015 11:39:49 AM      MDM   Final diagnoses:  Cerebral infarction due to unspecified mechanism  Renal insufficiency    Left sided symptoms, related to acute right brain CVA. Doubt acute left brain CVA. Incidental renal insufficiency without significant evidence for dehydration. He will require admission for further testing and observation in the hospital.  Nursing Notes Reviewed/ Care Coordinated, and agree without changes. Applicable Imaging Reviewed.  Interpretation of Laboratory Data incorporated into ED treatment  Plan: Admit    Daleen Bo, MD 06/21/15 [phone]

## 2015-06-20 NOTE — H&P (Signed)
History and Physical    Andrew [name] [id number] DOB: 29-Apr-1923 DOA: 06/20/2015   PCP: [name], MD   Patient coming from:  Home  Chief Complaint: Left sided weakness  HPI: Andrew [name] is a 82 y.o. male with medical history significant for HTN, HLD, CAD s/p CABG, COPD, CHF, presenting to the ED with one day history of progressive left sided weakness accompanied by falls x2 . He never had a similar episode. Denies vertigo dizziness or vision changes. Denies headaches. No dysarthria. No dysphagia. No confusion. No seizures. No new urinary symptoms (known urge incontinence)  Denies any chest pain, or shortness of breath. Denies any fever or chills, or night sweats.Does not smoke. No new meds or hormonal supplements.  Does take a regular ASA a day, with no other antiplatelets or anticoagulants. Patient is compliant with his medications. Denies any recent long distance trips. No recent surgeries. No sick contacts. No new stressors present at work and in personal life. He is very active, exercising daily. He is not a diabetic. No family history of stroke but does have a history of TIA in 2015. Patient was not administered TPA  as is beyond time window for treatment consideration. Will admit for further evaluation and treatment.   ED Course:  BP 157/59 mmHg  Pulse 52  Temp(Src) 98.9 F (37.2 C) (Oral)  Resp 20  Ht 5' 9.5" (1.765 m)  Wt 88.451 kg (195 lb)  BMI 28.39 kg/m2  SpO2 99%  MRI brain showed Small acute nonhemorrhagic infarct posterior limb right internal capsule. Question tiny medial left parietal lobe infarct. Marked chronic microvascular changes. Global moderate atrophy without hydrocephalus. No intracranial hemorrhage. Postsurgical changes left mastoid region. No obvious recurrent mass. Major intracranial vascular structures are patent. Narrowing left vertebral artery suspected. Paranasal sinus mucosal thickening/opacification most notable right maxillary sinus. CBC remarkable  for MCV 102, PLatelets 87k  CMET : Cr 1.4, T Bil 1.5   Review of Systems: As per HPI otherwise 10 point review of systems negative.   Past Medical History  Diagnosis Date  . HYPERCHOLESTEROLEMIA   . CAD, ARTERY BYPASS GRAFT   . CAROTID ARTERY STENOSIS 03/07/2010    80%  . AORTIC STENOSIS   . GASTROESOPHAGEAL REFLUX DISEASE   . Skin cancer   . History of colonic polyps     1999, 2004  . Allergic rhinitis   . Thyroid nodule 05/2010    Abnormal biopsy, 82 year old patient and his wife have made informed decision to not pursue surgical resection. Potential risk including cancer has been thoroughly discussed with the patient.  . S/P excision of acoustic neuroma   . History of prostate cancer 2002    s/p treatment with seeds / radiation  . Lumbar spinal stenosis 07/13/2009  . COPD, mild ([place]) 12/22/2010  . Pancreatic cyst 01/29/2013    Noted on MRI scan from [facility] on 07/27/2011.  I reviewed report at patient request on 01-29-2013.  "Small cystic focus in the posterior pancreatic head.  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  Follow up MRI is advised. The main pancreatic duct remains normal in appearance."  Patient do  . CHF (congestive heart failure) ([place])   . Macular degeneration (senile) of retina 04/29/2014    Past Surgical History  Procedure Laterality Date  . Coronary artery bypass graft    . Transperineal implatation of palladium    .  Cholecystectomy    . Shoulder surgery    . Total knee arthroplasty    . Cataract extraction    . Tonsillectomy and adenoidectomy  1932  . Craniectomy for excision of acoustic neuroma  1985  . Cataract extraction  2010  . Eye surgery       reports that he quit smoking about 54 years ago. His smoking use included Cigarettes. He has a 40 pack-year smoking history. He has never used smokeless tobacco. He reports that he drinks about 4.2 oz of  alcohol per week. He reports that he does not use illicit drugs.  Walks with walker    No Known Allergies  Family History  Problem Relation Age of Onset  . Emphysema Brother   . Lung cancer Brother   . Alcohol abuse    . Arthritis    . Cancer    . Macular degeneration    . Heart disease Father   . Hypertension Father   . Heart attack Father   . Colon cancer Sister   . Non-Hodgkin's lymphoma Sister   . Heart disease Brother   . Colon cancer Sister   . Skin cancer Sister   . Lung cancer Daughter       Prior to Admission medications   Medication Sig Start Date End Date Taking? Authorizing Provider  acetaminophen (TYLENOL) 500 MG tablet Take 500 mg by mouth every 6 (six) hours as needed (pain).   Yes Historical Provider, MD  albuterol (PROVENTIL HFA;VENTOLIN HFA) 108 (90 BASE) MCG/ACT inhaler Inhale 2 puffs into the lungs every 6 (six) hours as needed for wheezing or shortness of breath. 05/30/13  Yes Orma Flaming, MD  aspirin 325 MG tablet Take 325 mg by mouth daily.   Yes Historical Provider, MD  B Complex-C-Folic Acid (NEPHRO-VITE PO) Take 1 tablet by mouth daily.     Yes Historical Provider, MD  cholecalciferol (VITAMIN D) 400 units TABS tablet Take 400 Units by mouth daily.   Yes Historical Provider, MD  FLUoxetine (PROZAC) 10 MG tablet Take 1 tablet (10 mg total) by mouth daily. 05/02/15  Yes Spencer Copland, MD  furosemide (LASIX) 40 MG tablet TAKE 1 TABLET BY MOUTH EVERY MORNING AND 1/2 TABEVERY EVENING Patient taking differently: TAKE 1 TABLET BY MOUTH EVERY MORNING AND 1/2 TAB EVERY EVENING 02/21/15  Yes Larey Dresser, MD  isosorbide mononitrate (IMDUR) 60 MG 24 hr tablet Take 1.5 tablets (90 mg total) by mouth daily. 01/10/15  Yes Larey Dresser, MD  metoprolol succinate (TOPROL-XL) 25 MG 24 hr tablet Take 1 tablet (25 mg total) by mouth daily. 01/07/15  Yes Larey Dresser, MD  Multiple Vitamins-Minerals (PRESERVISION/LUTEIN) CAPS Take 1 capsule by mouth 2 (two) times  daily.   Yes Historical Provider, MD  nitroGLYCERIN (NITROLINGUAL) 0.4 MG/SPRAY spray PLACE 1 SPRAY UNDER THE TONGUE EVERY 5 (FIVE) MINUTES AS NEEDED. Patient taking differently: PLACE 1 SPRAY UNDER THE TONGUE EVERY 5 (FIVE) MINUTES AS NEEDED FOR CHEST PAIN 07/13/14  Yes Larey Dresser, MD  OVER THE COUNTER MEDICATION Place 1 drop into both eyes daily as needed (dry eyes). OTC lubricating eye drop   Yes Historical Provider, MD  potassium chloride SA (K-DUR,KLOR-CON) 20 MEQ tablet TAKE 1 TABLET BY MOUTH DAILY. 02/08/15  Yes Larey Dresser, MD  pravastatin (PRAVACHOL) 40 MG tablet TAKE 1 TABLET DAILY  ( CALL TO SCHEDULE AN 6 MONTHS APPOINTMENT FOR FUTURE REFILLS ) Patient taking differently: TAKE 1 TABLET BY MOUTH DAILY WITH SUPPER (  CALL TO SCHEDULE AN 6 MONTHS APPOINTMENT FOR FUTURE REFILLS ) 04/19/15  Yes Larey Dresser, MD  PRESCRIPTION MEDICATION Apply 1 application topically daily as needed (rash). Cream for unknown rash   Yes Historical Provider, MD    Physical Exam:    Filed Vitals:   06/20/15 1240 06/20/15 1300 06/20/15 1330 06/20/15 1400  BP: 118/67 133/74 139/59 157/59  Pulse: 52 51 52 52  Temp:      TempSrc:      Resp: 18 16 16 20   Height:      Weight:      SpO2: 97% 96% 96% 99%      Constitutional: NAD, calm, comfortable Filed Vitals:   06/20/15 1240 06/20/15 1300 06/20/15 1330 06/20/15 1400  BP: 118/67 133/74 139/59 157/59  Pulse: 52 51 52 52  Temp:      TempSrc:      Resp: 18 16 16 20   Height:      Weight:      SpO2: 97% 96% 96% 99%   Eyes: PERRL, lids and conjunctivae normal ENMT: Mucous membranes are moist. Posterior pharynx clear of any exudate or lesions.Normal dentition.  Neck: normal, supple, no masses, no thyromegaly Respiratory: clear to auscultation bilaterally, no wheezing, no crackles. Normal respiratory effort. No accessory muscle use.  Cardiovascular: Regular rate and rhythm, 2/6  murmurs / rubs / gallops. No extremity edema. 2+ pedal pulses. No  carotid bruits.  Abdomen: no tenderness, no masses palpated. No hepatosplenomegaly. Bowel sounds positive.  Musculoskeletal: no clubbing / cyanosis. No joint deformity upper and lower extremities. Good ROM, no contractures. Normal muscle tone.  Skin: no rashes, lesions, ulcers. No induration. Few areas of ecchymoses Neurologic: CN 2-12 grossly intact. Sensation intact, DTR normal. Strength 4/5 LUE, 3/5 LLL o/w normal  Psychiatric: Normal judgment and insight. Alert and oriented x 3. Normal mood.     Labs on Admission: I have personally reviewed following labs and imaging studies  CBC:  Recent Labs Lab 06/20/15 1032 06/20/15 1058  WBC 7.2  --   NEUTROABS 4.8  --   HGB 14.9 15.6  HCT 45.6 46.0  MCV 105.1*  --   PLT 87*  --     Basic Metabolic Panel:  Recent Labs Lab 06/20/15 1032 06/20/15 1058  NA 139 140  K 4.3 4.2  CL 105 101  CO2 29  --   GLUCOSE 117* 109*  BUN 25* 27*  CREATININE 1.50* 1.40*  CALCIUM 9.2  --     GFR: Estimated Creatinine Clearance: 38.9 mL/min (by C-G formula based on Cr of 1.4).  Liver Function Tests:  Recent Labs Lab 06/20/15 1032  AST 46*  ALT 43  ALKPHOS 68  BILITOT 1.5*  PROT 6.4*  ALBUMIN 3.6   No results for input(s): LIPASE, AMYLASE in the last 168 hours. No results for input(s): AMMONIA in the last 168 hours.  Coagulation Profile:  Recent Labs Lab 06/20/15 1032  INR 1.11    CBG:  Recent Labs Lab 06/20/15 1040  GLUCAP 94    Urine analysis:    Component Value Date/Time   COLORURINE LT. YELLOW 07/05/2009 0858   APPEARANCEUR CLEAR 07/05/2009 0858   LABSPEC 1.020 07/05/2009 0858   PHURINE 6.0 07/05/2009 0858   GLUCOSEU NEGATIVE 07/05/2009 0858   BILIRUBINUR NEGATIVE 07/05/2009 0858   KETONESUR NEGATIVE 07/05/2009 0858   UROBILINOGEN 0.2 07/05/2009 0858   NITRITE NEGATIVE 07/05/2009 0858   LEUKOCYTESUR NEGATIVE 07/05/2009 0858    Sepsis Labs: @LABRCNTIP (procalcitonin:4,lacticidven:4) )No results found  for this or any previous visit (from the past 240 hour(s)).   Radiological Exams on Admission: Mr Brain Wo Contrast (neuro Protocol)  06/20/2015  CLINICAL DATA:  82 year old male with left-sided deficits since yesterday. Fall twice since episode. History of vestibular schwannoma resection 1985. Initial encounter. EXAM: MRI HEAD WITHOUT CONTRAST TECHNIQUE: Multiplanar, multiecho pulse sequences of the brain and surrounding structures were obtained without intravenous contrast. COMPARISON:  05/04/2009. FINDINGS: Small acute nonhemorrhagic infarct posterior limb right internal capsule. Question tiny medial left parietal lobe infarct. Marked chronic microvascular changes. Global moderate atrophy without hydrocephalus. No intracranial hemorrhage. Postsurgical changes left mastoid region. No obvious recurrent mass. Major intracranial vascular structures are patent. Narrowing left vertebral artery suspected. Post lens replacement. Paranasal sinus mucosal thickening/opacification most notable right maxillary sinus. Cervical medullary junction within normal limits. IMPRESSION: Small acute nonhemorrhagic infarct posterior limb right internal capsule. Question tiny medial left parietal lobe infarct. Marked chronic microvascular changes. Global moderate atrophy without hydrocephalus. No intracranial hemorrhage. Postsurgical changes left mastoid region. No obvious recurrent mass. Major intracranial vascular structures are patent. Narrowing left vertebral artery suspected. Paranasal sinus mucosal thickening/opacification most notable right maxillary sinus. Electronically Signed   By: Genia Del M.D.   On: 06/20/2015 12:38    2 D Echo 11/2014  moderate LVH. Systolic function was normal. The estimated ejection fraction was in the range of 60%  to 65%. Wall motion was normal; there were no regional wall  motion abnormalities. Doppler parameters are consistent with pseudo normal left ventricular relaxation (grade 2  diastolic dysfunction)  EKG: Independently reviewed.  Assessment/Plan Active Problems:   HYPERCHOLESTEROLEMIA   Hx of CABG   PVD- moderate carotid disease   COPD, minimal-mild   Chronic diastolic CHF (congestive heart failure) (HCC)   Pseudodementia   Chronic renal insufficiency, stage III (moderate)   CVA (cerebral infarction)   Stroke (HCC)   Macrocytosis without anemia   Hyperbilirubinemia   Acute- left sided weakness with recurrent falls x2, now with diagnosis of CVA as evidenced by MRI brain , which showed Small acute nonhemorrhagic infarct posterior limb right internal capsule. Question tiny medial left parietal lobe infarct. Marked chronic microvascular changes.  No intracranial hemorrhage.  No obvious recurrent mass.  Narrowing left vertebral artery suspected.  Not a TPA candidate as her last known normal was the night prior to hospitalization. Last US carotids on 12/28/13 shoed Bilat carotid stenosis  ([id number] LICAs)  -Tele / Inpatient MRA head and neck Allow permissive HTN -Neuro following, awaiting recommendations - 2 D echo  -PT/OT/SLP  lipid panel -Aspirin  Social Work, likely to need inpatient rehab SCDs due to thrombocytopenia    Hypertension BP 157/59 mmHg  Pulse 52  Temp(Src) 98.9 F (37.2 C) (Oral)  Resp 20  Ht 5' 9.5" (1.765 m)  Wt 88.451 kg (195 lb)  BMI 28.39 kg/m2  SpO2 99% Controlled Allow permissive hypertension today  Holding antihypertensives and diuretics  Add Hydralazine Q6 hours as needed for BP  210/110   Chronic kidney disease stage IIIB  baseline creatinine 1.3      Lab Results  Component Value Date   CREATININE 1.40* 06/20/2015   CREATININE 1.50* 06/20/2015   CREATININE 1.39 10/28/2014  Hold Lasix  IV hydration, monitor I/O    Hyperlipidemia Check lipid panel  Continue home statins   CAD, s/p  CABG, now treated medically patient is cardiac pain free at this time.  Home meds on hold, will resume tomorrow.    Hyperbilirubinemia,  In the setting  of possible chronic liver disease, possible ETOH, chronic meds  T  bil 1.5. AST 46 He denies any abdominal pain. No jaundice noted.  Hold tylenol  Repeat CMET in am Can be followed as outpatient   Presumed Alcohol abuse and dependence and at risk for withdrawal.  -  Telemetry ETOH level  -  CIWA protocol  Macrocytosis, MCV 105. Hb normal  Possible related to ?ETOH, vs. Absorption. Repeat CBC in am Check B12  Thrombocytopenia Likely chronic, presumed ETOH, current PLts 87k. No bleeding issues noted No transfusion is indicated at this time Monitor counts closely Transfuse 1 unit of platelets if count is less or equal than 10,000 or 20,000 if the patient is acutely bleeding SCDs for now  COPD, minimal, currently asymptomatic  Continue home meds  Deconditioning due to Left sided weakness Consult PT/OT Social Work consult for Rehab placement  Depression Continue Prozac  DVT prophylaxis: SCDs Code Status:   Full   Family Communication:  Discussed with wife  Disposition Plan: Expect patient to be discharged to home after condition improves Consults called:   Neuro Admission status: Inpatient Tele   [facility] E, PA-C [facility]   If 7PM-7AM, please contact night-coverage [email or web address] Password [facility]  06/20/2015, 3:23 PM

## 2015-06-20 NOTE — ED Notes (Signed)
Patient given cup of water to patient.

## 2015-06-21 ENCOUNTER — Inpatient Hospital Stay (HOSPITAL_COMMUNITY): Payer: Medicare Other

## 2015-06-21 DIAGNOSIS — D696 Thrombocytopenia, unspecified: Secondary | ICD-10-CM

## 2015-06-21 DIAGNOSIS — I639 Cerebral infarction, unspecified: Secondary | ICD-10-CM

## 2015-06-21 DIAGNOSIS — Z951 Presence of aortocoronary bypass graft: Secondary | ICD-10-CM

## 2015-06-21 DIAGNOSIS — R001 Bradycardia, unspecified: Secondary | ICD-10-CM

## 2015-06-21 DIAGNOSIS — J449 Chronic obstructive pulmonary disease, unspecified: Secondary | ICD-10-CM

## 2015-06-21 DIAGNOSIS — I10 Essential (primary) hypertension: Secondary | ICD-10-CM | POA: Insufficient documentation

## 2015-06-21 DIAGNOSIS — I5032 Chronic diastolic (congestive) heart failure: Secondary | ICD-10-CM

## 2015-06-21 DIAGNOSIS — I6789 Other cerebrovascular disease: Secondary | ICD-10-CM

## 2015-06-21 DIAGNOSIS — N189 Chronic kidney disease, unspecified: Secondary | ICD-10-CM

## 2015-06-21 DIAGNOSIS — I25810 Atherosclerosis of coronary artery bypass graft(s) without angina pectoris: Secondary | ICD-10-CM | POA: Insufficient documentation

## 2015-06-21 LAB — ECHOCARDIOGRAM COMPLETE
HEIGHTINCHES: 69.5 in
WEIGHTICAEL: 3120 [oz_av]

## 2015-06-21 LAB — LIPID PANEL
CHOLESTEROL: 115 mg/dL (ref 0–200)
HDL: 45 mg/dL (ref >40)
LDL Cholesterol: 56 mg/dL (ref 0–99)
Total CHOL/HDL Ratio: 2.6 RATIO
Triglycerides: 71 mg/dL (ref <150)
VLDL: 14 mg/dL (ref 0–40)

## 2015-06-21 MED ORDER — ACETAMINOPHEN 325 MG PO TABS
650.0000 mg | ORAL_TABLET | ORAL | Status: DC | PRN
  Administered 2015-06-21: 650 mg via ORAL
  Filled 2015-06-21: qty 2

## 2015-06-21 NOTE — Consult Note (Signed)
Physical Medicine and Rehabilitation Consult Reason for Consult: Nonhemorrhagic infarct posterior limb right internal capsule Referring Physician: [facility]   HPI: Andrew [name] is a 82 y.o. right handed male with history of hypertension, CAD status post CABG maintain on aspirin 325 mg daily, prostate cancer, COPD, diastolic congestive heart failure. Per chart review patient lives with wife. Independent prior to admission using a cane. He does not drive. Wife can assist. Daughter and Rondall Allegra. Presented 06/20/2015 with left-sided weakness. MRI showed small acute nonhemorrhagic infarct posterior limb right internal capsule. Question tiny medial left parietal lobe infarct. MRA no large vessel occlusion. Patient did not receive TPA. Carotid Dopplers and echocardiogram pending. Neurology follow-up presently maintained on aspirin. Tolerating a regular consistency diet. Occupational therapy evaluation completed with recommendations of physical medicine rehabilitation consult.   Review of Systems  Constitutional: Negative for fever and chills.  HENT: Positive for hearing loss.   Eyes: Negative for blurred vision and double vision.  Respiratory: Negative for cough.        Increased shortness of breath with exertion  Cardiovascular: Positive for leg swelling. Negative for chest pain and palpitations.  Gastrointestinal: Positive for constipation. Negative for nausea and vomiting.       GERD  Genitourinary: Positive for urgency. Negative for dysuria and hematuria.  Musculoskeletal: Positive for back pain.  Skin: Negative for rash.  Neurological: Positive for weakness. Negative for seizures and headaches.  All other systems reviewed and are negative.  Past Medical History  Diagnosis Date  . HYPERCHOLESTEROLEMIA   . CAD, ARTERY BYPASS GRAFT   . CAROTID ARTERY STENOSIS 03/07/2010    80%  . AORTIC STENOSIS   . GASTROESOPHAGEAL REFLUX DISEASE   . Skin cancer   . History of colonic polyps       1999, 2004  . Allergic rhinitis   . Thyroid nodule 05/2010    Abnormal biopsy, 82 year old patient and his wife have made informed decision to not pursue surgical resection. Potential risk including cancer has been thoroughly discussed with the patient.  . S/P excision of acoustic neuroma   . History of prostate cancer 2002    s/p treatment with seeds / radiation  . Lumbar spinal stenosis 07/13/2009  . COPD, mild ([place]) 12/22/2010  . Pancreatic cyst 01/29/2013    Noted on MRI scan from [facility] on 07/27/2011.  I reviewed report at patient request on 01-29-2013.  "Small cystic focus in the posterior pancreatic head.  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  Follow up MRI is advised. The main pancreatic duct remains normal in appearance."  Patient do  . CHF (congestive heart failure) ([place])   . Macular degeneration (senile) of retina 04/29/2014   Past Surgical History  Procedure Laterality Date  . Coronary artery bypass graft    . Transperineal implatation of palladium    . Cholecystectomy    . Shoulder surgery    . Total knee arthroplasty    . Cataract extraction    . Tonsillectomy and adenoidectomy  1932  . Craniectomy for excision of acoustic neuroma  1985  . Cataract extraction  2010  . Eye surgery     Family History  Problem Relation Age of Onset  . Emphysema Brother   . Lung cancer Brother   . Alcohol abuse    . Arthritis    . Cancer    . Macular degeneration    .  Heart disease Father   . Hypertension Father   . Heart attack Father   . Colon cancer Sister   . Non-Hodgkin's lymphoma Sister   . Heart disease Brother   . Colon cancer Sister   . Skin cancer Sister   . Lung cancer Daughter    Social History:  reports that he quit smoking about 54 years ago. His smoking use included Cigarettes. He has a 40 pack-year smoking history. He has never used smokeless tobacco.  He reports that he drinks about 4.2 oz of alcohol per week. He reports that he does not use illicit drugs. Allergies: No Known Allergies Medications Prior to Admission  Medication Sig Dispense Refill  . acetaminophen (TYLENOL) 500 MG tablet Take 500 mg by mouth every 6 (six) hours as needed (pain).    Marland Kitchen albuterol (PROVENTIL HFA;VENTOLIN HFA) 108 (90 BASE) MCG/ACT inhaler Inhale 2 puffs into the lungs every 6 (six) hours as needed for wheezing or shortness of breath. 1 Inhaler 3  . aspirin 325 MG tablet Take 325 mg by mouth daily.    . B Complex-C-Folic Acid (NEPHRO-VITE PO) Take 1 tablet by mouth daily.      . cholecalciferol (VITAMIN D) 400 units TABS tablet Take 400 Units by mouth daily.    [name] 10 MG tablet Take 1 tablet (10 mg total) by mouth daily. 90 tablet 1  . furosemide (LASIX) 40 MG tablet TAKE 1 TABLET BY MOUTH EVERY MORNING AND 1/2 TABEVERY EVENING (Patient taking differently: TAKE 1 TABLET BY MOUTH EVERY MORNING AND 1/2 TAB EVERY EVENING) 135 tablet 3  . isosorbide mononitrate (IMDUR) 60 MG 24 hr tablet Take 1.5 tablets (90 mg total) by mouth daily. 135 tablet 3  . metoprolol succinate (TOPROL-XL) 25 MG 24 hr tablet Take 1 tablet (25 mg total) by mouth daily. 90 tablet 3  . Multiple Vitamins-Minerals (PRESERVISION/LUTEIN) CAPS Take 1 capsule by mouth 2 (two) times daily.    . nitroGLYCERIN (NITROLINGUAL) 0.4 MG/SPRAY spray PLACE 1 SPRAY UNDER THE TONGUE EVERY 5 (FIVE) MINUTES AS NEEDED. (Patient taking differently: PLACE 1 SPRAY UNDER THE TONGUE EVERY 5 (FIVE) MINUTES AS NEEDED FOR CHEST PAIN) 4.9 g 12  . OVER THE COUNTER MEDICATION Place 1 drop into both eyes daily as needed (dry eyes). OTC lubricating eye drop    . potassium chloride SA (K-DUR,KLOR-CON) 20 MEQ tablet TAKE 1 TABLET BY MOUTH DAILY. 90 tablet 2  . pravastatin (PRAVACHOL) 40 MG tablet TAKE 1 TABLET DAILY  ( CALL TO SCHEDULE AN 6 MONTHS APPOINTMENT FOR FUTURE REFILLS ) (Patient taking differently: TAKE 1  TABLET BY MOUTH DAILY WITH SUPPER ( CALL TO SCHEDULE AN 6 MONTHS APPOINTMENT FOR FUTURE REFILLS )) 90 tablet 3  . PRESCRIPTION MEDICATION Apply 1 application topically daily as needed (rash). Cream for unknown rash      Home: Home Living Family/patient expects to be discharged to:: Private residence Living Arrangements: Spouse/significant other Available Help at Discharge: Available 24 hours/day, Family Type of Home: House Home Equipment: Environmental consultant - 2 wheels Additional Comments: wife drives   Functional History: Prior Function Level of Independence: Independent Functional Status:  Mobility: Bed Mobility Overal bed mobility: Needs Assistance Bed Mobility: Rolling, Supine to Sit Rolling: Min guard Supine to sit: Mod assist General bed mobility comments: Pt exiting on L side and needs (A) to elevate trunk off surface Transfers Overall transfer level: Needs assistance Equipment used: Rolling walker (2 wheeled) Transfers: Sit to/from Stand Sit to Stand: Min assist General transfer  comment: pt pulling up on RW. pt needs cues for hand placement for safety      ADL: ADL Overall ADL's : Needs assistance/impaired Eating/Feeding: Set up, Sitting Eating/Feeding Details (indicate cue type and reason): (A) with opening containers due to visual deficits. Pt able to self feed  Grooming: Wash/dry hands, Set up, Sitting Grooming Details (indicate cue type and reason): used wet wipes to clear hands Upper Body Dressing : Minimal assistance, Sitting Toilet Transfer: Moderate assistance, RW Toilet Transfer Details (indicate cue type and reason): narrowed base of support. pt has to stop and have (A) to correct posture priro to proceeding with transfer Functional mobility during ADLs: Moderate assistance, Rolling walker General ADL Comments: pt supine on arrival and reports "i need new legs" when starting session. Pt shows awareness to deficits with balance.   Cognition: Cognition Overall  Cognitive Status: Within Functional Limits for tasks assessed Orientation Level: Oriented X4 Cognition Arousal/Alertness: Awake/alert Behavior During Therapy: WFL for tasks assessed/performed Overall Cognitive Status: Within Functional Limits for tasks assessed  Blood pressure 129/45, pulse 71, temperature 97.4 F (36.3 C), temperature source Oral, resp. rate 16, height 5' 9.5" (1.765 m), weight 88.451 kg (195 lb), SpO2 94 %. Physical Exam  Vitals reviewed. Constitutional: He is oriented to person, place, and time. He appears well-developed and well-nourished.  HENT:  Head: Normocephalic and atraumatic.  Eyes: Conjunctivae and EOM are normal.  Neck: Normal range of motion. Neck supple. No thyromegaly present.  Cardiovascular: Normal rate and regular rhythm.   Respiratory: Effort normal and breath sounds normal. No respiratory distress.  GI: Soft. Bowel sounds are normal. He exhibits no distension.  Musculoskeletal: He exhibits no edema or tenderness.  Neurological: He is alert and oriented to person, place, and time.  Patient is a bit hard of hearing.  Follows full commands.  Good awareness of deficits Sensation intact to light touch DTRs symmetric Left facial weakness (baseline) Motor: B/l UE 4-5+/5 proximal to distal RLE: 4+-5/5 proximal to distal  LLE: Hip flexion 4-/5, knee extension 4/5, ankle dorsi/plantar flexion 4+/5  Skin: Skin is warm and dry.  Psychiatric: He has a normal mood and affect. His behavior is normal.    Results for orders placed or performed during the hospital encounter of 06/20/15 (from the past 24 hour(s))  Blood Alcohol Level     Status: None   Collection Time: 06/20/15  3:49 PM  Result Value Ref Range   Alcohol, Ethyl (B) <5 <5 mg/dL  Urine rapid drug screen (hosp performed)not at [facility]     Status: None   Collection Time: 06/20/15  4:32 PM  Result Value Ref Range   Opiates NONE DETECTED NONE DETECTED   Cocaine NONE DETECTED NONE DETECTED    Benzodiazepines NONE DETECTED NONE DETECTED   Amphetamines NONE DETECTED NONE DETECTED   Tetrahydrocannabinol NONE DETECTED NONE DETECTED   Barbiturates NONE DETECTED NONE DETECTED  Lipid panel     Status: None   Collection Time: 06/21/15  5:38 AM  Result Value Ref Range   Cholesterol 115 0 - 200 mg/dL   Triglycerides 71 <150 mg/dL   HDL 45 >40 mg/dL   Total CHOL/HDL Ratio 2.6 RATIO   VLDL 14 0 - 40 mg/dL   LDL Cholesterol 56 0 - 99 mg/dL   Mr Brain Wo Contrast (neuro Protocol)  06/20/2015  CLINICAL DATA:  82 year old male with left-sided deficits since yesterday. Fall twice since episode. History of vestibular schwannoma resection 1985. Initial encounter. EXAM: MRI HEAD WITHOUT CONTRAST TECHNIQUE:  Multiplanar, multiecho pulse sequences of the brain and surrounding structures were obtained without intravenous contrast. COMPARISON:  05/04/2009. FINDINGS: Small acute nonhemorrhagic infarct posterior limb right internal capsule. Question tiny medial left parietal lobe infarct. Marked chronic microvascular changes. Global moderate atrophy without hydrocephalus. No intracranial hemorrhage. Postsurgical changes left mastoid region. No obvious recurrent mass. Major intracranial vascular structures are patent. Narrowing left vertebral artery suspected. Post lens replacement. Paranasal sinus mucosal thickening/opacification most notable right maxillary sinus. Cervical medullary junction within normal limits. IMPRESSION: Small acute nonhemorrhagic infarct posterior limb right internal capsule. Question tiny medial left parietal lobe infarct. Marked chronic microvascular changes. Global moderate atrophy without hydrocephalus. No intracranial hemorrhage. Postsurgical changes left mastoid region. No obvious recurrent mass. Major intracranial vascular structures are patent. Narrowing left vertebral artery suspected. Paranasal sinus mucosal thickening/opacification most notable right maxillary sinus. Electronically  Signed   By: Genia Del M.D.   On: 06/20/2015 12:38   Mr Jodene Nam Head/brain Wo Cm  06/20/2015  CLINICAL DATA:  Stroke.  Acute left-sided weakness. EXAM: MRA HEAD WITHOUT CONTRAST TECHNIQUE: Angiographic images of the Circle of Willis were obtained using MRA technique without intravenous contrast. COMPARISON:  MRI 06/20/2015 FINDINGS: Image quality degraded by motion. This level of motion obscures detail in small vessels. Both vertebral arteries patent to the basilar. PICA patent bilaterally. Basilar is patent. Superior cerebellar arteries patent bilaterally. Fetal origin of the right posterior cerebral artery. Internal carotid artery is patent bilaterally without significant stenosis. Anterior and middle cerebral arteries are patent bilaterally. No aneurysm identified IMPRESSION: Image quality degraded by mild to moderate motion. No large vessel occlusion. Electronically Signed   By: Franchot Gallo M.D.   On: 06/20/2015 18:10    Assessment/Plan: Diagnosis: Nonhemorrhagic infarct posterior limb right internal capsule  Labs and images independently reviewed.  Records reviewed and summated above. Stroke: Continue secondary stroke prophylaxis and Risk Factor Modification listed below:   Antiplatelet therapy:   Blood Pressure Management:  Continue current medication with prn's with permisive HTN per primary team Statin Agent:   LLE sided hemiparesis Motor recovery: Fluoxetine  1. Does the need for close, 24 hr/day medical supervision in concert with the patient's rehab needs make it unreasonable for this patient to be served in a less intensive setting? Yes 2. Co-Morbidities requiring supervision/potential complications: HTN (monitor and provide prns in accordance with increased physical exertion and pain), CAD status post CABG (cont meds), prostate cancer, COPD (monitor O2 sats and RR with increased physical activity), diastolic congestive heart failure (monitor weights), bradycardia (monitor HR with  increased physical activity for appropriate response), Thrombocytopenia (< 60,000/mm3 no resistive exercise), CKD (avoid nephrotoxic meds) 3. Due to bladder management, safety, disease management, medication administration and patient education, does the patient require 24 hr/day rehab nursing? Yes 4. Does the patient require coordinated care of a physician, rehab nurse, PT (1-2 hrs/day, 5 days/week) and OT (1-2 hrs/day, 5 days/week) to address physical and functional deficits in the context of the above medical diagnosis(es)? Yes Addressing deficits in the following areas: balance, endurance, locomotion, strength, transferring, toileting and psychosocial support 5. Can the patient actively participate in an intensive therapy program of at least 3 hrs of therapy per day at least 5 days per week? Yes 6. The potential for patient to make measurable gains while on inpatient rehab is excellent 7. Anticipated functional outcomes upon discharge from inpatient rehab are supervision and min assist  with PT, modified independent and supervision with OT, n/a with SLP. 8. Estimated rehab length of stay  to reach the above functional goals is: 15-18 days. 9. Does the patient have adequate social supports and living environment to accommodate these discharge functional goals? Yes 10. Anticipated D/C setting: Home 11. Anticipated post D/C treatments: HH therapy and Home excercise program 12. Overall Rehab/Functional Prognosis: good  RECOMMENDATIONS: This patient's condition is appropriate for continued rehabilitative care in the following setting: CIR after completion of medical workup.  Patient has agreed to participate in recommended program. Yes Note that insurance prior authorization may be required for reimbursement for recommended care.  Comment: Rehab Admissions Coordinator to follow up.   Delice Lesch, MD 06/21/2015

## 2015-06-21 NOTE — Evaluation (Signed)
Physical Therapy Evaluation Patient Details Name: Andrew [name] MRN: [id number] DOB: 1923-12-02 Today's Date: 06/21/2015   History of Present Illness  82 yo male with L side weakness. pt s/p fall x2 MRI (+) Small acute nonhemorrhagic infarct posterior limb R internal capsule PMH:HLD, CAD, Prostate, COPD, CHF, TIA 2015, macular degeneration( senile) of retina, shoulder surg, CABG, craniectomy for excision of acoustic neuroma   Clinical Impression  Pt admitted with above. Pt with ataxic gait and requiring modA for safe OOB mobility. Pt was indep PTA and ambulating without AD. Recommend CIR upon d/c for maximal functional recovery for safe transition home with wife.    Follow Up Recommendations CIR    Equipment Recommendations  Rolling walker with 5" wheels    Recommendations for Other Services Rehab consult     Precautions / Restrictions Precautions Precautions: Fall Precaution Comments: macular degeneration Restrictions Weight Bearing Restrictions: No      Mobility  Bed Mobility Overal bed mobility: Needs Assistance Bed Mobility: Rolling;Supine to Sit Rolling: Min guard   Supine to sit: Mod assist     General bed mobility comments: pt in chair  Transfers Overall transfer level: Needs assistance Equipment used: Rolling walker (2 wheeled) Transfers: Sit to/from Stand Sit to Stand: Min assist         General transfer comment: v/c's for hand placement and walker safety,   Ambulation/Gait Ambulation/Gait assistance: Mod assist;+2 safety/equipment (2nd person for chair follow and walker management) Ambulation Distance (Feet): 40 Feet (x2) Assistive device: Rolling walker (2 wheeled) Gait Pattern/deviations: Decreased stride length;Decreased step length - right;Decreased stance time - right;Ataxic Gait velocity: slow Gait velocity interpretation: Below normal speed for age/gender General Gait Details: pt with exagerated step with L LE, improved with visual cues for  placement. pt requires tactile cues at hips for sequencing steps and assist for safe walker management as pt pushes it to far infront  Stairs            Wheelchair Mobility    Modified Rankin (Stroke Patients Only) Modified Rankin (Stroke Patients Only) Pre-Morbid Rankin Score: No significant disability Modified Rankin: Moderately severe disability     Balance Overall balance assessment: Needs assistance Sitting-balance support: Bilateral upper extremity supported Sitting balance-Leahy Scale: Fair     Standing balance support: Bilateral upper extremity supported Standing balance-Leahy Scale: Poor                               Pertinent Vitals/Pain Pain Assessment: 0-10 Pain Score: 4  Pain Location: R side of chest from falling on walker Pain Descriptors / Indicators: Sore Pain Intervention(s): Monitored during session    Home Living Family/patient expects to be discharged to:: Inpatient rehab Living Arrangements: Spouse/significant other Available Help at Discharge: Available 24 hours/day Type of Home: House         Home Equipment: Walker - 2 wheels Additional Comments: wife drives     Prior Function Level of Independence: Independent               Hand Dominance   Dominant Hand: Right    Extremity/Trunk Assessment   Upper Extremity Assessment: Defer to OT evaluation       LUE Deficits / Details: noted ataxi movements during transfers   Lower Extremity Assessment: LLE deficits/detail   LLE Deficits / Details: pt with impaired sensation and proprioception wiht L LE demonstrating ataxic mvmt with exagerated step on L LE. increased bilat UE WBing due  to decreased L LE WBing. when pt given visual on the floor to aim when taking step with L LE pt with improved gait pattern  Cervical / Trunk Assessment: Kyphotic  Communication   Communication: No difficulties  Cognition Arousal/Alertness: Awake/alert Behavior During Therapy: WFL for  tasks assessed/performed Overall Cognitive Status: Within Functional Limits for tasks assessed                      General Comments General comments (skin integrity, edema, etc.): pt with macular degeneration, can not see detail. pt assisted to bathroom, pt with call light in hand and family present    Exercises        Assessment/Plan    PT Assessment Patient needs continued PT services  PT Diagnosis Generalized weakness;Difficulty walking   PT Problem List Decreased strength;Decreased range of motion;Decreased activity tolerance;Decreased mobility;Decreased balance;Decreased coordination;Decreased knowledge of use of DME  PT Treatment Interventions DME instruction;Gait training;Therapeutic activities;Therapeutic exercise;Balance training;Functional mobility training;Stair training;Neuromuscular re-education   PT Goals (Current goals can be found in the Care Plan section) Acute Rehab PT Goals Patient Stated Goal: walk PT Goal Formulation: With patient Time For Goal Achievement: 07/05/15 Potential to Achieve Goals: Good    Frequency Min 4X/week   Barriers to discharge Decreased caregiver support spouse avail but unable to physically assist    Co-evaluation               End of Session Equipment Utilized During Treatment: Gait belt Activity Tolerance: Patient tolerated treatment well Patient left: with call bell/phone within reach;with family/visitor present (on commode with call light) Nurse Communication: Mobility status (pt on commode)         Time: [id number] PT Time Calculation (min) (ACUTE ONLY): 35 min   Charges:   PT Evaluation $PT Eval High Complexity: 1 Procedure PT Treatments $Therapeutic Activity: 8-22 mins   PT G CodesKingsley Callander 06/21/2015, 11:59 AM   Kittie Plater, PT, DPT Pager #: [phone] Office #: [phone]

## 2015-06-21 NOTE — Progress Notes (Signed)
Rehab Admissions Coordinator Note:  Patient was screened by Cleatrice Burke for appropriateness for an Inpatient Acute Rehab Consult per OT recommendation.   At this time, we are recommending Inpatient Rehab consult. Please place consult as appropriate.   Cleatrice Burke 06/21/2015, 8:51 AM  I can be reached at [phone].

## 2015-06-21 NOTE — Progress Notes (Signed)
I will follow up tomorrow with pt and family to discuss an inpt rehab admission and to begin insurance authorization for admission pending medical workup completion. [id number]

## 2015-06-21 NOTE — Progress Notes (Signed)
Pt in chair, finished eating dinner.

## 2015-06-21 NOTE — Progress Notes (Signed)
Pt in chair

## 2015-06-21 NOTE — Progress Notes (Signed)
Pt in chair. rn assisted pt to get tv on

## 2015-06-21 NOTE — [place] FL2 (Signed)
[place] MEDICAID FL2 LEVEL OF CARE SCREENING TOOL     IDENTIFICATION  Patient Name: Andrew [name] Birthdate: April 07, 1923 Sex: male Admission Date (Current Location): 06/20/2015  [facility] and [place] Number:  Herbalist and Address:  [facility], [place] [address] [place], [place] [address]      Provider Number: [id number]  Attending Physician Name and Address:  Albertine Patricia, MD  Relative Name and Phone Number:  Rod Holler, spouse, [phone]    Current Level of Care: Hospital Recommended Level of Care: [place] Prior Approval Number:    Date Approved/Denied:   PASRR Number: [id number]  Discharge Plan: SNF    Current Diagnoses: Patient Active Problem List   Diagnosis Date Noted  . Benign essential HTN   . Coronary artery disease involving coronary bypass graft of native heart without angina pectoris   . Bradycardia   . Thrombocytopenia ([place])   . Right-sided cerebrovascular accident (CVA) ([place])   . CVA (cerebral infarction) 06/20/2015  . Stroke ([place]) 06/20/2015  . Macrocytosis without anemia 06/20/2015  . Hyperbilirubinemia 06/20/2015  . Cerebral infarction due to unspecified mechanism   . Chronic renal insufficiency, stage III (moderate) 06/02/2014  . Macular degeneration (senile) of retina 04/29/2014  . 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 Major depressive disorder, single episode 06/11/2013  . TIA (transient ischemic attack) 06/03/2013  . Pancreatic cyst 01/29/2013  . OSA (obstructive sleep apnea) 12/26/2012  . Chronic diastolic CHF (congestive heart failure) ([place]) 08/19/2012  . COPD, minimal-mild 12/22/2010  . S/P excision of acoustic neuroma 06/18/2010  . Pulmonary nodule 06/17/2010  . Vertigo 06/14/2010  . THYROID NODULE 03/13/2010  . PVD- moderate carotid disease 03/07/2010  . Spinal stenosis, lumbar region, with neurogenic claudication 07/13/2009  . ALLERGIC RHINITIS 09/01/2008  . COLONIC POLYPS, HX OF  09/01/2008  . HYPERCHOLESTEROLEMIA 06/18/2008  . Angina, class II ([place]) 06/18/2008  . Hx of CABG 06/18/2008  . Moderate aortic stenosis 06/18/2008  . GASTROESOPHAGEAL REFLUX DISEASE 06/18/2008    Orientation RESPIRATION BLADDER Height & Weight     Self, Time, Situation, Place  Normal Continent, Indwelling catheter (Urinary catheter) Weight: 88.451 kg (195 lb) Height:  5' 9.5" (176.5 cm)  BEHAVIORAL SYMPTOMS/MOOD NEUROLOGICAL BOWEL NUTRITION STATUS      Continent Diet (Please see DC Summary)  AMBULATORY STATUS COMMUNICATION OF NEEDS Skin   Limited Assist Verbally Normal                       Personal Care Assistance Level of Assistance  Bathing, Feeding, Dressing Bathing Assistance: Limited assistance Feeding assistance: Limited assistance Dressing Assistance: Limited assistance     Functional Limitations Info  Sight Sight Info: Impaired        SPECIAL CARE FACTORS FREQUENCY  PT (By licensed PT), OT (By licensed OT)     PT Frequency: min 4x/week OT Frequency: min 2x/week            Contractures      Additional Factors Info  Code Status, Allergies, Psychotropic Code Status Info: Full Allergies Info: NKA Psychotropic Info: Prozac         Current Medications (06/21/2015):  This is the current hospital active medication list Current Facility-Administered Medications  Medication Dose Route Frequency Provider Last Rate Last Dose  . 0.9 %  sodium chloride infusion   Intravenous Continuous Rondel Jumbo, PA-C 50 mL/hr at 06/20/15 1813    . acetaminophen (TYLENOL) tablet 650 mg  650 mg Oral Q4H  PRN Rhetta Mura Schorr, NP   650 mg at 06/21/15 0441  . albuterol (PROVENTIL) (2.5 MG/3ML) 0.083% nebulizer solution 2.5 mg  2.5 mg Nebulization Q6H PRN Romona Curls, RPH      . aspirin suppository 300 mg  300 mg Rectal Daily Rondel Jumbo, PA-C       Or  . aspirin tablet 325 mg  325 mg Oral Daily Rondel Jumbo, PA-C   325 mg at 06/21/15 1052  . FLUoxetine (PROZAC)  capsule 10 mg  10 mg Oral Daily Rondel Jumbo, PA-C   10 mg at 06/21/15 1052  . furosemide (LASIX) tablet 40 mg  40 mg Oral Daily Rondel Jumbo, PA-C   40 mg at 06/21/15 1054  . hydrALAZINE (APRESOLINE) injection 5-10 mg  5-10 mg Intravenous Q8H PRN Rondel Jumbo, PA-C      . LORazepam (ATIVAN) tablet 1 mg  1 mg Oral Q6H PRN Rondel Jumbo, PA-C       Or  . LORazepam (ATIVAN) injection 1 mg  1 mg Intravenous Q6H PRN Rondel Jumbo, PA-C      . multivitamin (RENA-VIT) tablet 1 tablet  1 tablet Oral Daily Rondel Jumbo, PA-C   1 tablet at 06/21/15 1054  . potassium chloride SA (K-DUR,KLOR-CON) CR tablet 20 mEq  20 mEq Oral Daily Rondel Jumbo, PA-C   20 mEq at 06/21/15 1052  . pravastatin (PRAVACHOL) tablet 40 mg  40 mg Oral Daily Rondel Jumbo, PA-C   40 mg at 06/21/15 1052  . senna-docusate (Senokot-S) tablet 1 tablet  1 tablet Oral QHS PRN Rondel Jumbo, PA-C      . thiamine (VITAMIN B-1) tablet 100 mg  100 mg Oral Daily Rondel Jumbo, PA-C   100 mg at 06/21/15 1052   Or  . thiamine (B-1) injection 100 mg  100 mg Intravenous Daily Rondel Jumbo, PA-C         Discharge Medications: Please see discharge summary for a list of discharge medications.  Relevant Imaging Results:  Relevant Lab Results:   Additional Information SSN: [place], [place]

## 2015-06-21 NOTE — Progress Notes (Signed)
STROKE TEAM PROGRESS NOTE   HISTORY OF PRESENT ILLNESS (per record) Andrew [name] is an 82 y.o. male who presents for evaluation of difficulty moving left Lower extremity weakness resulting in falls, 2 yesterday. While in the ED he obtained MRI which revealed a acute right IC infarct . He has 1-39 right ICA stenosis and left ICA stenosis of 60-79% on the left. Currently his main complaint is left leg weakness. Neurology was asked to evaluate. His last known well was unable to be determined. Patient was not administered IV t-PA secondary to being outside the window. He was admitted for further evaluation and treatment.   SUBJECTIVE (INTERVAL HISTORY) His  Wife and daughter are at the bedside.  Overall he feels his condition is rapidly worsening. .I reviewed in detail the patient's history of presentation with the patient and family and answered questions about his ongoing stroke evaluation.   OBJECTIVE Temp:  [98 F (36.7 C)-99.6 F (37.6 C)] 98.2 F (36.8 C) (05/30 0430) Pulse Rate:  [50-62] 57 (05/30 0430) Cardiac Rhythm:  [-] Sinus bradycardia (05/30 0707) Resp:  [15-20] 16 (05/30 0430) BP: (118-178)/(43-90) 150/50 mmHg (05/30 0430) SpO2:  [95 %-99 %] 97 % (05/30 0430) Weight:  [88.451 kg (195 lb)] 88.451 kg (195 lb) (05/29 1007)  CBC:  Recent Labs Lab 06/20/15 1032 06/20/15 1058  WBC 7.2  --   NEUTROABS 4.8  --   HGB 14.9 15.6  HCT 45.6 46.0  MCV 105.1*  --   PLT 87*  --     Basic Metabolic Panel:  Recent Labs Lab 06/20/15 1032 06/20/15 1058  NA 139 140  K 4.3 4.2  CL 105 101  CO2 29  --   GLUCOSE 117* 109*  BUN 25* 27*  CREATININE 1.50* 1.40*  CALCIUM 9.2  --     Lipid Panel:    Component Value Date/Time   CHOL 115 06/21/2015 0538   TRIG 71 06/21/2015 0538   HDL 45 06/21/2015 0538   CHOLHDL 2.6 06/21/2015 0538   VLDL 14 06/21/2015 0538   LDLCALC 56 06/21/2015 0538   HgbA1c: No results found for: HGBA1C Urine Drug Screen:    Component Value Date/Time    LABOPIA NONE DETECTED 06/20/2015 1632   COCAINSCRNUR NONE DETECTED 06/20/2015 1632   LABBENZ NONE DETECTED 06/20/2015 1632   AMPHETMU NONE DETECTED 06/20/2015 1632   THCU NONE DETECTED 06/20/2015 1632   LABBARB NONE DETECTED 06/20/2015 1632      IMAGING  Mr Brain Wo Contrast (neuro Protocol)  06/20/2015  CLINICAL DATA:  82 year old male with left-sided deficits since yesterday. Fall twice since episode. History of vestibular schwannoma resection 1985. Initial encounter. EXAM: MRI HEAD WITHOUT CONTRAST TECHNIQUE: Multiplanar, multiecho pulse sequences of the brain and surrounding structures were obtained without intravenous contrast. COMPARISON:  05/04/2009. FINDINGS: Small acute nonhemorrhagic infarct posterior limb right internal capsule. Question tiny medial left parietal lobe infarct. Marked chronic microvascular changes. Global moderate atrophy without hydrocephalus. No intracranial hemorrhage. Postsurgical changes left mastoid region. No obvious recurrent mass. Major intracranial vascular structures are patent. Narrowing left vertebral artery suspected. Post lens replacement. Paranasal sinus mucosal thickening/opacification most notable right maxillary sinus. Cervical medullary junction within normal limits. IMPRESSION: Small acute nonhemorrhagic infarct posterior limb right internal capsule. Question tiny medial left parietal lobe infarct. Marked chronic microvascular changes. Global moderate atrophy without hydrocephalus. No intracranial hemorrhage. Postsurgical changes left mastoid region. No obvious recurrent mass. Major intracranial vascular structures are patent. Narrowing left vertebral artery suspected. Paranasal sinus mucosal thickening/opacification  most notable right maxillary sinus. Electronically Signed   By: Genia Del M.D.   On: 06/20/2015 12:38   Mr Jodene Nam Head/brain Wo Cm  06/20/2015  CLINICAL DATA:  Stroke.  Acute left-sided weakness. EXAM: MRA HEAD WITHOUT CONTRAST  TECHNIQUE: Angiographic images of the Circle of Willis were obtained using MRA technique without intravenous contrast. COMPARISON:  MRI 06/20/2015 FINDINGS: Image quality degraded by motion. This level of motion obscures detail in small vessels. Both vertebral arteries patent to the basilar. PICA patent bilaterally. Basilar is patent. Superior cerebellar arteries patent bilaterally. Fetal origin of the right posterior cerebral artery. Internal carotid artery is patent bilaterally without significant stenosis. Anterior and middle cerebral arteries are patent bilaterally. No aneurysm identified IMPRESSION: Image quality degraded by mild to moderate motion. No large vessel occlusion. Electronically Signed   By: Franchot Gallo M.D.   On: 06/20/2015 18:10    PHYSICAL EXAM Obese elderly caucasian male not in distress. . Afebrile. Head is nontraumatic. Neck is supple without bruit.    Cardiac exam no murmur or gallop. Lungs are clear to auscultation. Distal pulses are well felt. Neurological Exam ;  Awake alert oriented x 3 normal speech and language. Mild left lower face asymmetry. Tongue midline. No drift. Mild diminished fine finger movements on left. Orbits right over left upper extremity. Mild left grip weak..Mild left hip flexor weakness 4/5. Normal sensation . Normal coordination. ASSESSMENT/PLAN [name] is a 82 y.o. male with history of HLD, CAD, Prostate cancer, COPD, CHF presenting with LLE weakness and falls x 2 . He did not receive IV t-PA due to delay in arrival.   Stroke:  Non-dominant right PLIC infarct embolic secondary to small vessel disease  Resultant  Mild left leg paresis  MRI  R PLIC infarct, ? Tiny L parietal lobe infarct  MRA  No large vessel stenosis  Carotid Doppler  01/2015 R 1-39%, L 60-79% stenosis. Planned OP repeat 08/22/2015  2D Echo  pending  LDL 56  HgbA1c pending  SCDs for VTE prophylaxis  Diet Heart Room service appropriate?: Yes; Fluid consistency::  Thin  aspirin 325 mg daily prior to admission, now on aspirin 325 mg daily  Patient counseled to be compliant with his antithrombotic medications  Ongoing aggressive stroke risk factor management  Therapy recommendations:  CIR. Consult placed  Disposition:  CLR  Hypertension  Stable Permissive hypertension (OK if < 220/120) but gradually normalize in 5-7 days  Hyperlipidemia  Home meds:  pravachol 40, not resumed in hospital  LDL 56, at goal < 70  Resume statin   Continue statin at discharge  Other Stroke Risk Factors  Advanced age  Former Cigarette smoker  ETOH use, presumed abuse and dependence at risk for withdrawal  Overweight, Body mass index is 28.39 kg/(m^2).   Coronary artery disease s/p CABG  Aortic stenosis  CHF  Other Active Problems  CKD stage IIIb  Hyperbilirubinemia, In the setting of possible chronic liver disease  Macrocytosis  Thrombocytopenia, Plt 87  COPD  depression  Hospital day # 1  I have personally examined this patient, reviewed notes, independently viewed imaging studies, participated in medical decision making and plan of care. I have made any additions or clarifications directly to the above note. He presented with mild left leg weakness and gait difficulty with falls secondary to small right internal capsule infarct etiology likely small vessel disease. He remains at risk for neurological worsening, recurrent stroke, TIA and needs ongoing stroke evaluation and aggressive risk factor  modification. Recommend he start aspirin for stroke prevention and finish ongoing stroke evaluation. Mobilize out of bed. Physical occupational therapy consults. I had a long discussion at the bedside with the patient's wife and daughter and on third questions.Greater than 50% time during this 35 minutes visit was spent on counseling and coordination of care about stroke risk, stroke evaluation and prevention.Discuss with Dr. Georgeanna Lea,  MD Medical Director [place] Pager: [phone] 06/21/2015 2:42 PM     To contact Stroke Continuity provider, please refer to [email or web address] After hours, contact General Neurology

## 2015-06-21 NOTE — Progress Notes (Signed)
Patient is very alert and oriented concerned about physical rehab process I explained that PT, OT would evaluate him and make recommendations and that his input is vital to the process.

## 2015-06-21 NOTE — Progress Notes (Signed)
  Echocardiogram 2D Echocardiogram has been performed.  Andrew [name] R 06/21/2015, 2:00 PM

## 2015-06-21 NOTE — Progress Notes (Signed)
Patient was recalling the events that brought him to hospital and recalled falling over walker and hitting chest on walker, is now complaining of chest pain on right side of chest just below breast. No chest x-ray was made at this time he reports no trouble breathing and chest movement is symmetrical. Will continue to monitor.

## 2015-06-21 NOTE — Progress Notes (Signed)
PROGRESS NOTE                                                                                                                                                                                                             Patient Demographics:    Andrew [name], is a 82 y.o. male, DOB - [birth year], [id number]  Admit date - 06/20/2015   Admitting Physician Waldemar Dickens, MD  Outpatient Primary MD for the patient is [name], MD  LOS - 1  Chief Complaint  Patient presents with  . Weakness    L sided       Brief Narrative   82 y.o. male with  history of HTN, HLD, CAD s/p CABG, COPD, CHF, presenting with progressive left sided weakness accompanied by falls x2 ,MRI showing acute right ICA infarct, med for further CVA workup   Subjective:    Andrew [name] today has, No headache, No abdominal pain - Complaints of right side rib chest pain  from fall.  Assessment  & Plan :    Active Problems:   HYPERCHOLESTEROLEMIA   Hx of CABG   PVD- moderate carotid disease   COPD, minimal-mild   Chronic diastolic CHF (congestive heart failure) (H[facility])   Pseudodementia   Chronic renal insufficiency, stage III (moderate)   CVA (cerebral infarction)   Stroke ([facility])   Macrocytosis without anemia   Hyperbilirubinemia   Cerebral infarction due to unspecified mechanism   Benign essential HTN   Coronary artery disease involving coronary bypass graft of native heart without angina pectoris   Bradycardia   Thrombocytopenia ([facility])   Right-sided cerebrovascular accident (CVA) ([place])   Acute CVA - MRI brain Small acute nonhemorrhagic infarct posterior limb right internal Capsule, Question tiny medial left parietal lobe infarct. - Carotid Doppler and a new 2017 showing R 1-39%, L 60-79% stenosis, repeat still pending. - 2-D echo pending - LDL is 56 - A1c still pending - On aspirin 325 mg oral daily(home dose) - Plan for CIR  Hypertension - allow  for permissive  hypertension  Hyperlipidemia - LDL 56, at goal < 70, on Pravachol at home.  CAD - s/p CABG -  aspirin and statin, resume on metoprolol and Imdur in 1-2 days  Chronic kidney disease stage III - Baseline, continue to monitor  Presumed Alcohol abuse and dependence  -  CIWA protocol  Thrombocytopenia  - Likely chronic, presumed ETOH, current PLts 87k on admission. No bleeding issues noted.  COPD - Continue with home meds  Depression - Continue with Prozac  Code Status : Full  Family Communication  : None at bedside  Disposition Plan  : CIR  Consults  :  Neurology, CIR  Procedures  : none  DVT Prophylaxis  :  SCDs   Lab Results  Component Value Date   PLT 87* 06/20/2015    Antibiotics  :    Anti-infectives    None        Objective:   Filed Vitals:   06/21/15 0232 06/21/15 0430 06/21/15 1026 06/21/15 1212  BP: 145/48 150/50 129/45 162/53  Pulse: 56 57 71 51  Temp: 98.3 F (36.8 C) 98.2 F (36.8 C) 97.4 F (36.3 C)   TempSrc: Oral Oral Oral   Resp: 16 16 16 15   Height:      Weight:      SpO2: 97% 97% 94% 96%    Wt Readings from Last 3 Encounters:  06/20/15 88.451 kg (195 lb)  05/02/15 88.792 kg (195 lb 12 oz)  02/21/15 87.998 kg (194 lb)     Intake/Output Summary (Last 24 hours) at 06/21/15 1436 Last data filed at 06/21/15 1056  Gross per 24 hour  Intake    240 ml  Output   1185 ml  Net   -945 ml     Physical Exam  Awake Alert, Oriented X 3,  [place].AT,PERRAL Supple Neck,No JVD, No cervical lymphadenopathy appriciated.  Symmetrical Chest wall movement, Good air movement bilaterally, CTAB RRR,No Gallops,Rubs or new Murmurs, No Parasternal Heave +ve B.Sounds, Abd Soft, No tenderness,  No Cyanosis, Clubbing or edema, No new Rash or bruise      Data Review:    CBC  Recent Labs Lab 06/20/15 1032 06/20/15 1058  WBC 7.2  --   HGB 14.9 15.6  HCT 45.6 46.0  PLT 87*  --   MCV 105.1*  --   MCH 34.3*  --   MCHC 32.7  --   RDW 13.6   --   LYMPHSABS 1.3  --   MONOABS 0.8  --   EOSABS 0.3  --   BASOSABS 0.0  --     Chemistries   Recent Labs Lab 06/20/15 1032 06/20/15 1058  NA 139 140  K 4.3 4.2  CL 105 101  CO2 29  --   GLUCOSE 117* 109*  BUN 25* 27*  CREATININE 1.50* 1.40*  CALCIUM 9.2  --   AST 46*  --   ALT 43  --   ALKPHOS 68  --   BILITOT 1.5*  --    ------------------------------------------------------------------------------------------------------------------  Recent Labs  06/21/15 0538  CHOL 115  HDL 45  LDLCALC 56  TRIG 71  CHOLHDL 2.6    No results found for: HGBA1C ------------------------------------------------------------------------------------------------------------------ No results for input(s): TSH, T4TOTAL, T3FREE, THYROIDAB in the last 72 hours.  Invalid input(s): FREET3 ------------------------------------------------------------------------------------------------------------------ No results for input(s): VITAMINB12, FOLATE, FERRITIN, TIBC, IRON, RETICCTPCT in the last 72 hours.  Coagulation profile  Recent Labs Lab 06/20/15 1032  INR 1.11    No results for input(s): DDIMER in the last 72 hours.  Cardiac Enzymes No results for input(s): CKMB, TROPONINI, MYOGLOBIN in the last 168 hours.  Invalid input(s): CK ------------------------------------------------------------------------------------------------------------------ No results found for: BNP  Inpatient Medications  Scheduled Meds: . sodium chloride   Intravenous STAT  . aspirin  300 mg Rectal  Daily   Or  . aspirin  325 mg Oral Daily  . FLUoxetine  10 mg Oral Daily  . furosemide  40 mg Oral Daily  . multivitamin  1 tablet Oral Daily  . potassium chloride SA  20 mEq Oral Daily  . pravastatin  40 mg Oral Daily  . thiamine  100 mg Oral Daily   Or  . thiamine  100 mg Intravenous Daily   Continuous Infusions: . sodium chloride 50 mL/hr at 06/20/15 1813   PRN Meds:.acetaminophen, albuterol,  hydrALAZINE, LORazepam **OR** LORazepam, senna-docusate  Micro Results No results found for this or any previous visit (from the past 240 hour(s)).  Radiology Reports Mr Brain Wo Contrast (neuro Protocol)  06/20/2015  CLINICAL DATA:  82 year old male with left-sided deficits since yesterday. Fall twice since episode. History of vestibular schwannoma resection 1985. Initial encounter. EXAM: MRI HEAD WITHOUT CONTRAST TECHNIQUE: Multiplanar, multiecho pulse sequences of the brain and surrounding structures were obtained without intravenous contrast. COMPARISON:  05/04/2009. FINDINGS: Small acute nonhemorrhagic infarct posterior limb right internal capsule. Question tiny medial left parietal lobe infarct. Marked chronic microvascular changes. Global moderate atrophy without hydrocephalus. No intracranial hemorrhage. Postsurgical changes left mastoid region. No obvious recurrent mass. Major intracranial vascular structures are patent. Narrowing left vertebral artery suspected. Post lens replacement. Paranasal sinus mucosal thickening/opacification most notable right maxillary sinus. Cervical medullary junction within normal limits. IMPRESSION: Small acute nonhemorrhagic infarct posterior limb right internal capsule. Question tiny medial left parietal lobe infarct. Marked chronic microvascular changes. Global moderate atrophy without hydrocephalus. No intracranial hemorrhage. Postsurgical changes left mastoid region. No obvious recurrent mass. Major intracranial vascular structures are patent. Narrowing left vertebral artery suspected. Paranasal sinus mucosal thickening/opacification most notable right maxillary sinus. Electronically Signed   By: Genia Del M.D.   On: 06/20/2015 12:38   Dg Chest Port 1 View  06/21/2015  CLINICAL DATA:  Right rib and chest wall pain. Fall 2 days ago. Initial encounter. EXAM: PORTABLE CHEST 1 VIEW COMPARISON:  10/27/2014 FINDINGS: Low lung volumes again noted. No evidence of  pneumothorax or hemothorax. No evidence of acute infiltrate. Heart size remains stable. Prior CABG noted. IMPRESSION: Low lung volumes.  No acute findings. Electronically Signed   By: Earle Gell M.D.   On: 06/21/2015 13:35   Mr Jodene Nam Head/brain Wo Cm  06/20/2015  CLINICAL DATA:  Stroke.  Acute left-sided weakness. EXAM: MRA HEAD WITHOUT CONTRAST TECHNIQUE: Angiographic images of the Circle of Willis were obtained using MRA technique without intravenous contrast. COMPARISON:  MRI 06/20/2015 FINDINGS: Image quality degraded by motion. This level of motion obscures detail in small vessels. Both vertebral arteries patent to the basilar. PICA patent bilaterally. Basilar is patent. Superior cerebellar arteries patent bilaterally. Fetal origin of the right posterior cerebral artery. Internal carotid artery is patent bilaterally without significant stenosis. Anterior and middle cerebral arteries are patent bilaterally. No aneurysm identified IMPRESSION: Image quality degraded by mild to moderate motion. No large vessel occlusion. Electronically Signed   By: Franchot Gallo M.D.   On: 06/20/2015 18:10    Time Spent in minutes  25 minutes   Maclin Guerrette M.D on 06/21/2015 at 2:36 PM  Between 7am to 7pm - Pager - [phone]  After 7pm go to [email or web address] - password [facility]  [facility] -  Office  [phone]

## 2015-06-21 NOTE — Evaluation (Signed)
Occupational Therapy Evaluation Patient Details Name: Andrew [name] MRN: [id number] DOB: August 29, 1923 Today's Date: 06/21/2015    History of Present Illness 82 yo male with L side weakness. 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 Pt currently with functional limitiations due to the deficits listed below (see OT problem list). PTA was independent at home.  Pt will benefit from skilled OT to increase their independence and safety with adls and balance to allow discharge CIR.     Follow Up Recommendations  CIR    Equipment Recommendations  Other (comment) (defer to CIR)    Recommendations for Other Services Rehab consult     Precautions / Restrictions Precautions Precautions: Fall      Mobility Bed Mobility Overal bed mobility: Needs Assistance Bed Mobility: Rolling;Supine to Sit Rolling: Min guard   Supine to sit: Mod assist     General bed mobility comments: Pt exiting on L side and needs (A) to elevate trunk off surface  Transfers Overall transfer level: Needs assistance Equipment used: Rolling walker (2 wheeled) Transfers: Sit to/from Stand Sit to Stand: Min assist         General transfer comment: pt pulling up on RW. pt needs cues for hand placement for safety    Balance Overall balance assessment: Needs assistance Sitting-balance support: Bilateral upper extremity supported;Feet supported Sitting balance-Leahy Scale: Fair     Standing balance support: Bilateral upper extremity supported;During functional activity Standing balance-Leahy Scale: Poor                              ADL Overall ADL's : Needs assistance/impaired Eating/Feeding: Set  up;Sitting Eating/Feeding Details (indicate cue type and reason): (A) with opening containers due to visual deficits. Pt able to self feed  Grooming: Wash/dry hands;Set up;Sitting Grooming Details (indicate cue type and reason): used wet wipes to clear hands         Upper Body Dressing : Minimal assistance;Sitting       Toilet Transfer: Moderate assistance;RW Toilet Transfer Details (indicate cue type and reason): narrowed base of support. pt has to stop and have (A) to correct posture priro to proceeding with transfer         Functional mobility during ADLs: Moderate assistance;Rolling walker General ADL Comments: pt supine on arrival and reports "i need new legs" when starting session. Pt shows awareness to deficits with balance.      Vision Vision Assessment?: Vision impaired- to be further tested in functional context Additional Comments: assessed with functional task. pt requires items very close to face and describes therapist as a "face in fog"   Perception     Praxis      Pertinent Vitals/Pain Pain Assessment: No/denies pain     Hand Dominance Right   Extremity/Trunk Assessment Upper Extremity Assessment Upper Extremity Assessment: LUE deficits/detail LUE Deficits / Details: ataxic movement, shoulder flexion 90 degrees, pt reports "this is the bad one" pt with incr time able to touch top of his head   Lower Extremity Assessment Lower Extremity Assessment: LLE deficits/detail LLE Deficits / Details: pt dragging L LE, pt with overly exacerated L LE stepping. pt with R LE  positioned posteriorly  ( staggered standing posture  with narrowed base of support)   Cervical / Trunk Assessment Cervical / Trunk Assessment: Kyphotic   Communication Communication Communication: No difficulties   Cognition Arousal/Alertness: Awake/alert Behavior During Therapy: WFL for tasks assessed/performed Overall Cognitive Status: Within Functional Limits for tasks assessed                      General Comments       Exercises       Shoulder Instructions      Home Living Family/patient expects to be discharged to:: Private residence Living Arrangements: Spouse/significant other Available Help at Discharge: Available 24 hours/day;Family Type of Home: House                       Home Equipment: Gilford Rile - 2 wheels   Additional Comments: wife drives       Prior Functioning/Environment Level of Independence: Independent             OT Diagnosis: Generalized weakness   OT Problem List: Decreased strength;Decreased activity tolerance;Impaired balance (sitting and/or standing);Decreased coordination;Decreased cognition;Decreased safety awareness;Decreased knowledge of use of DME or AE;Decreased knowledge of precautions;Impaired UE functional use   OT Treatment/Interventions: Self-care/ADL training;Therapeutic exercise;Neuromuscular education;DME and/or AE instruction;Therapeutic activities;Cognitive remediation/compensation;Patient/family education;Balance training;Visual/perceptual remediation/compensation    OT Goals(Current goals can be found in the care plan section) Acute Rehab OT Goals Patient Stated Goal: to walk again OT Goal Formulation: With patient Time For Goal Achievement: 07/05/15 Potential to Achieve Goals: Good  OT Frequency: Min 2X/week   Barriers to D/C: Other (comment)  elderly wife        Co-evaluation              End of Session Equipment Utilized During Treatment: Rolling walker Nurse Communication: Mobility status;Precautions  Activity Tolerance: Patient tolerated treatment well Patient left: in chair;with call bell/phone within reach;with chair alarm set   Time: [phone] OT Time Calculation (min): 24 min Charges:  OT General Charges $OT Visit: 1 Procedure OT Evaluation $OT Eval Moderate Complexity: 1 Procedure OT Treatments $Self Care/Home Management : 8-22 mins G-Codes:    Andrew [name] June 26, 2015, 8:42 AM  Andrew [name]   OTR/L Pager: [phone] Office: [phone] .

## 2015-06-21 NOTE — Clinical Social Work Placement (Signed)
   CLINICAL SOCIAL WORK PLACEMENT  NOTE  Date:  06/21/2015  Patient Details  Name: Andrew [name] MRN: [id number] Date of Birth: [birth year]  Clinical Social Work is seeking post-discharge placement for this patient at the [place] level of care (*CSW will initial, date and re-position this form in  chart as items are completed):      Patient/family provided with [place] Work Department's list of facilities offering this level of care within the geographic area requested by the patient (or if unable, by the patient's family).      Patient/family informed of their freedom to choose among providers that offer the needed level of care, that participate in Medicare, Medicaid or managed care program needed by the patient, have an available bed and are willing to accept the patient.      Patient/family informed of [facility]'s ownership interest in [facility] and [facility], as well as of the fact that they are under no obligation to receive care at these facilities.  PASRR submitted to EDS on 06/21/15     PASRR number received on 06/21/15     Existing PASRR number confirmed on       FL2 transmitted to all facilities in geographic area requested by pt/family on 06/21/15     FL2 transmitted to all facilities within larger geographic area on       Patient informed that his/her managed care company has contracts with or will negotiate with certain facilities, including the following:            Patient/family informed of bed offers received.  Patient chooses bed at       Physician recommends and patient chooses bed at      Patient to be transferred to   on  .  Patient to be transferred to facility by       Patient family notified on   of transfer.  Name of family member notified:        PHYSICIAN Please sign FL2     Additional Comment:    _______________________________________________ Benard Halsted, [place] 06/21/2015, 5:00 PM

## 2015-06-21 NOTE — Care Management Note (Addendum)
Case Management Note  Patient Details  Name: DEYLON DIDION MRN: [id number] Date of Birth: 1923/09/12  Subjective/Objective:   Pt admitted with CVA. He is from home with spouse.                  Action/Plan: Awaiting PT rec. OT rec is for CIR. CM following for d/c needs.   Expected Discharge Date:                  Expected Discharge Plan:  [place]  In-House Referral:     Discharge planning Services     Post Acute Care Choice:    Choice offered to:     DME Arranged:    DME Agency:     HH Arranged:    [place] Agency:     Status of Service:  In process, will continue to follow  Medicare Important Message Given:    Date Medicare IM Given:    Medicare IM give by:    Date Additional Medicare IM Given:    Additional Medicare Important Message give by:     If discussed at [place] of Stay Meetings, dates discussed:    Additional Comments:  Pollie Friar, RN 06/21/2015, 10:41 AM

## 2015-06-22 ENCOUNTER — Inpatient Hospital Stay (HOSPITAL_COMMUNITY): Payer: Medicare Other

## 2015-06-22 DIAGNOSIS — W19XXXA Unspecified fall, initial encounter: Secondary | ICD-10-CM

## 2015-06-22 DIAGNOSIS — I639 Cerebral infarction, unspecified: Secondary | ICD-10-CM

## 2015-06-22 DIAGNOSIS — F1010 Alcohol abuse, uncomplicated: Secondary | ICD-10-CM | POA: Insufficient documentation

## 2015-06-22 NOTE — Progress Notes (Signed)
[facility] has denied admit at this time. We have completed peer to peer appeal and I have also initiated an expedited appeal on pt's behalf. I have notified both pt, his wife and Dr. Dyann Kief. I will follow up tomorrow. Wife states if pt unable to get approval for inpt rehab on this appeal, she will request [facility] SNF. [id number]

## 2015-06-22 NOTE — Care Management Important Message (Signed)
Important Message  Patient Details  Name: Andrew [name] MRN: [id number] Date of Birth: Jan 21, 1924   Medicare Important Message Given:  Yes    Loann Quill 06/22/2015, 9:23 AM

## 2015-06-22 NOTE — Progress Notes (Signed)
Physical Therapy Treatment Patient Details Name: Andrew [name] MRN: [id number] DOB: [birth year] Today's Date: 06/22/2015    History of Present Illness 82 yo male with L side weakness. 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 Pt remains at high risk of falls and appropriate for CIR as pt was indep PTA>  Follow Up Recommendations  CIR     Equipment Recommendations  Rolling walker with 5" wheels    Recommendations for Other Services Rehab consult     Precautions / Restrictions Precautions Precautions: Fall Precaution Comments: macular degeneration Restrictions Weight Bearing Restrictions: No    Mobility  Bed Mobility               General bed mobility comments: pt sitting EOB with RN tech  Transfers Overall transfer level: Needs assistance Equipment used: Rolling walker (2 wheeled) Transfers: Sit to/from Stand Sit to Stand: Min assist;Mod assist         General transfer comment: v/c's for hand placement, increased time  Ambulation/Gait Ambulation/Gait assistance: Mod assist;+2 physical assistance (1 person for chair follow) Ambulation Distance (Feet): 50 Feet (x2) Assistive device: Rolling walker (2 wheeled) Gait Pattern/deviations: Step-through pattern;Decreased step length - left;Decreased stance time - left Gait velocity: slow Gait velocity interpretation: Below normal speed for age/gender General Gait Details: max v/c's to widen BOS, tactile cues for sequencing at posterior L hip, pt remains ataxic but improved with smaller step length. minA to keep walker into forward  momentum   Stairs            Wheelchair Mobility    Modified Rankin (Stroke Patients Only) Modified Rankin (Stroke Patients Only) Pre-Morbid Rankin Score: No significant disability Modified Rankin: Moderately severe disability     Balance Overall balance assessment: Needs assistance Sitting-balance support: Feet supported;No upper extremity supported Sitting balance-Leahy Scale: Fair     Standing balance support: Bilateral upper extremity supported Standing balance-Leahy Scale: Poor                      Cognition Arousal/Alertness: Awake/alert Behavior During Therapy: WFL for tasks assessed/performed Overall Cognitive Status: Within Functional Limits for tasks assessed                      Exercises General Exercises - Lower Extremity Long Arc Quad: AROM;Left;10 reps;Seated (worked on slow and controlled)    General Comments General comments (skin integrity, edema, etc.): pt with macular degeneration, cut pt's food up for him      Pertinent Vitals/Pain Pain Assessment: 0-10 Pain Score: 3  Pain Location: R side of chest  Pain Descriptors / Indicators: Sore Pain Intervention(s): Monitored during session    Home Living                      Prior Function            PT Goals (current goals can now be found in the care plan section) Acute Rehab PT Goals Patient Stated Goal: walk Progress towards PT goals: Progressing toward goals    Frequency  Min 4X/week    PT Plan Current plan remains  appropriate    Co-evaluation             End of Session Equipment Utilized During Treatment: Gait belt Activity Tolerance: Patient tolerated treatment well Patient left: in chair;with call bell/phone within reach;with chair alarm set     Time: 0733-0800 PT Time Calculation (min) (ACUTE ONLY): 27 min  Charges:  $Gait Training: 23-37 mins                    G Codes:      Kingsley Callander 06/22/2015, 8:24 AM   Kittie Plater, PT, DPT Pager #: [phone] Office #: [phone]

## 2015-06-22 NOTE — Clinical Social Work Note (Signed)
Clinical Social Work Assessment  Patient Details  Name: Andrew [name] MRN: [id number] Date of Birth: 29-Sep-1923  Date of referral:  06/22/15               Reason for consult:  Facility Placement                Permission sought to share information with:  Facility Sport and exercise psychologist, Family Supports Permission granted to share information::  Yes, Verbal Permission Granted  Name::     Carleene Cooper::  SNFs  Relationship::  Spouse  Contact Information:  [phone]  Housing/Transportation Living arrangements for the past 2 months:  Single Family Home Source of Information:  Spouse Patient Interpreter Needed:  None Criminal Activity/Legal Involvement Pertinent to Current Situation/Hospitalization:  No - Comment as needed Significant Relationships:  Spouse, Adult Children Lives with:  Spouse Do you feel safe going back to the place where you live?  No Need for family participation in patient care:  Yes (Comment)  Care giving concerns:  CSW received referral for possible SNF placement at time of discharge. CSW met with patient and spoke with patient's wife regarding PT recommendation of SNF placement at time of discharge if CIR is unable to admit. Per patient's wife, patient's wife is currently unable to care for patient at their home given patient's current physical needs and fall risk. Patient and patient's wife expressed understanding of PT recommendation and are agreeable to SNF placement at time of discharge. CSW to continue to follow and assist with discharge planning needs.   Social Worker assessment / plan:  CSW spoke with patient and patient's wife concerning possibility of rehab at [facility] before returning home if CIR is unable to admit.  Employment status:  Retired Nurse, adult PT Recommendations:  Inpatient [place], [place] / Referral to community resources:  [place]  Patient/Family's Response to  care:  Patient and patient's wife recognize need for rehab before returning home and are agreeable to a SNF in [place]. Patient's wife suggested [facility], but she would really like patient to go to CIR.  Patient/Family's Understanding of and Emotional Response to Diagnosis, Current Treatment, and Prognosis:  Patient is realistic regarding therapy needs. No questions/concerns about plan or treatment.    Emotional Assessment Appearance:  Appears stated age Attitude/Demeanor/Rapport:  Other (Appropriate) Affect (typically observed):  Accepting Orientation:  Oriented to Self, Oriented to Place, Oriented to  Time, Oriented to Situation Alcohol / Substance use:  Not Applicable Psych involvement (Current and /or in the community):  No (Comment)  Discharge Needs  Concerns to be addressed:  Care Coordination Readmission within the last 30 days:  No Current discharge risk:  None Barriers to Discharge:  Continued Medical Work up   [employer], [place] 06/22/2015, 8:16 AM

## 2015-06-22 NOTE — Progress Notes (Signed)
PROGRESS NOTE                                                                                                                                                                                                             Patient Demographics:    Glade San, is a 82 y.o. male, DOB - 1923/09/03, DO:[id number]  Admit date - 06/20/2015   Admitting Physician Waldemar Dickens, MD  Outpatient Primary MD for the patient is [name], MD  LOS - 2  Chief Complaint  Patient presents with  . Weakness    L sided       Brief Narrative   82 y.o. male with  history of HTN, HLD, CAD s/p CABG, COPD, CHF, presenting with progressive left sided weakness accompanied by falls x2 ,MRI showing acute right ICA infarct, admitted for further CVA workup. Will need CIR vs SNF for rehab.   Subjective:    Jenna Wessell today has, No headache, No abdominal pain - Complaints of right side rib chest pain  from fall and left paresis.  Assessment  & Plan :    Active Problems:   HYPERCHOLESTEROLEMIA   Hx of CABG   PVD- moderate carotid disease   COPD, minimal-mild   Chronic diastolic CHF (congestive heart failure) (HCC)   Pseudodementia   Chronic renal insufficiency, stage III (moderate)   CVA (cerebral infarction)   Stroke (HCC)   Macrocytosis without anemia   Hyperbilirubinemia   Cerebral infarction due to unspecified mechanism   Benign essential HTN   Coronary artery disease involving coronary bypass graft of native heart without angina pectoris   Bradycardia   Thrombocytopenia (HCC)   Right-sided cerebrovascular accident (CVA) ([place])   Acute CVA - MRI brain Small acute nonhemorrhagic infarct posterior limb right internal Capsule, Question tiny medial left parietal lobe infarct. - Carotid Doppler in January showing R 1-39%, L 60-79% stenosis, repeated carotid duplex during this admission also demonstrating  1-39% right internal carotid artery stenosis and 60-79%  left internal carotid artery stenosis. Vertebral arteries are patent with antegrade flow (unchanged) - 2-D echo (with normal EF; grade 2 diastolic dysfunction, no wall motion abnormalities and no source of emboli)  - LDL is 56 - A1c still pending - On aspirin 325 mg oral daily  - Plan for CIR vs SNF for rehab  Hypertension -  allowing  for permissive hypertension in acute ischemic setting -will resume BP meds in am  Hyperlipidemia - LDL 56, at goal < 70,  -continue on Pravachol   CAD -s/p CABG - aspirin and statin -will resume on metoprolol and Imdur in am  Chronic diastolic heart failure: -euvolemic and compensated -daily weights, low sodium diet and BP control  Chronic kidney disease stage III -At Baseline, continue to monitor  Presumed Alcohol abuse and dependence  -continue CIWA protocol  Thrombocytopenia  - Likely chronic, presumed associated with chronic ETOH use -No bleeding issues noted. -will monitor CBC in am  COPD -Continue with home meds -stable, no wheezing or SOB on exam  Depression - Continue with Prozac -stable and no SI or hallucinations   Code Status : Full  Family Communication  : Wife at bedside  Disposition Plan  : CIR vs SNF (in case insurance denies CIR)  Consults  :  Neurology, CIR  Procedures  : none  DVT Prophylaxis  :  SCDs   Lab Results  Component Value Date   PLT 87* 06/20/2015    Antibiotics  :    Anti-infectives    None        Objective:   Filed Vitals:   06/22/15 0948 06/22/15 1140 06/22/15 1455 06/22/15 1820  BP: 135/57 127/76 140/69 126/56  Pulse: 56  58 56  Temp: 98.3 F (36.8 C) 98.6 F (37 C) 98.1 F (36.7 C) 98.4 F (36.9 C)  TempSrc: Oral Oral Oral Oral  Resp: 17 17 16 17   Height:      Weight:      SpO2: 95% 98% 97% 96%    Wt Readings from Last 3 Encounters:  06/20/15 88.451 kg (195 lb)  05/02/15 88.792 kg (195 lb 12 oz)  02/21/15 87.998 kg (194 lb)     Intake/Output Summary (Last 24  hours) at 06/22/15 1923 Last data filed at 06/22/15 1824  Gross per 24 hour  Intake      0 ml  Output   1500 ml  Net  -1500 ml     Physical Exam Awake Alert, Oriented X 3, in no major distress and denying CP or SOB. No fever. [place].AT,PERRLA; hard of hearing (hearing aids in place) Supple Neck,No JVD, No cervical lymphadenopathy appriciated.  Symmetrical Chest wall movement, Good air movement bilaterally, CTAB RRR,No Gallops, Rubs or new Murmurs, No Parasternal Heave +ve B.Sounds, Abd Soft, No tenderness,  No Cyanosis, Clubbing or edema, No new Rash or bruise      Data Review:    CBC  Recent Labs Lab 06/20/15 1032 06/20/15 1058  WBC 7.2  --   HGB 14.9 15.6  HCT 45.6 46.0  PLT 87*  --   MCV 105.1*  --   MCH 34.3*  --   MCHC 32.7  --   RDW 13.6  --   LYMPHSABS 1.3  --   MONOABS 0.8  --   EOSABS 0.3  --   BASOSABS 0.0  --     Chemistries   Recent Labs Lab 06/20/15 1032 06/20/15 1058  NA 139 140  K 4.3 4.2  CL 105 101  CO2 29  --   GLUCOSE 117* 109*  BUN 25* 27*  CREATININE 1.50* 1.40*  CALCIUM 9.2  --   AST 46*  --   ALT 43  --   ALKPHOS 68  --   BILITOT 1.5*  --    ------------------------------------------------------------------------------------------------------------------  Recent Labs  06/21/15 0538  CHOL  115  HDL 45  LDLCALC 56  TRIG 71  CHOLHDL 2.6   Coagulation profile  Recent Labs Lab 06/20/15 1032  INR 1.11   --------------------------------------------------------------------------------------- No results found for: BNP  Inpatient Medications  Scheduled Meds: . aspirin  300 mg Rectal Daily   Or  . aspirin  325 mg Oral Daily  . FLUoxetine  10 mg Oral Daily  . furosemide  40 mg Oral Daily  . multivitamin  1 tablet Oral Daily  . potassium chloride SA  20 mEq Oral Daily  . pravastatin  40 mg Oral Daily  . thiamine  100 mg Oral Daily   Or  . thiamine  100 mg Intravenous Daily   Continuous Infusions: . sodium chloride  50 mL/hr at 06/21/15 1726   PRN Meds:.acetaminophen, albuterol, hydrALAZINE, LORazepam **OR** LORazepam, senna-docusate  Radiology Reports Mr Brain Wo Contrast (neuro Protocol)  06/20/2015  CLINICAL DATA:  82 year old male with left-sided deficits since yesterday. Fall twice since episode. History of vestibular schwannoma resection 1985. Initial encounter. EXAM: MRI HEAD WITHOUT CONTRAST TECHNIQUE: Multiplanar, multiecho pulse sequences of the brain and surrounding structures were obtained without intravenous contrast. COMPARISON:  05/04/2009. FINDINGS: Small acute nonhemorrhagic infarct posterior limb right internal capsule. Question tiny medial left parietal lobe infarct. Marked chronic microvascular changes. Global moderate atrophy without hydrocephalus. No intracranial hemorrhage. Postsurgical changes left mastoid region. No obvious recurrent mass. Major intracranial vascular structures are patent. Narrowing left vertebral artery suspected. Post lens replacement. Paranasal sinus mucosal thickening/opacification most notable right maxillary sinus. Cervical medullary junction within normal limits. IMPRESSION: Small acute nonhemorrhagic infarct posterior limb right internal capsule. Question tiny medial left parietal lobe infarct. Marked chronic microvascular changes. Global moderate atrophy without hydrocephalus. No intracranial hemorrhage. Postsurgical changes left mastoid region. No obvious recurrent mass. Major intracranial vascular structures are patent. Narrowing left vertebral artery suspected. Paranasal sinus mucosal thickening/opacification most notable right maxillary sinus. Electronically Signed   By: Genia Del M.D.   On: 06/20/2015 12:38   Dg Chest Port 1 View  06/21/2015  CLINICAL DATA:  Right rib and chest wall pain. Fall 2 days ago. Initial encounter. EXAM: PORTABLE CHEST 1 VIEW COMPARISON:  10/27/2014 FINDINGS: Low lung volumes again noted. No evidence of pneumothorax or hemothorax. No  evidence of acute infiltrate. Heart size remains stable. Prior CABG noted. IMPRESSION: Low lung volumes.  No acute findings. Electronically Signed   By: Earle Gell M.D.   On: 06/21/2015 13:35   Mr Jodene Nam Head/brain Wo Cm  06/20/2015  CLINICAL DATA:  Stroke.  Acute left-sided weakness. EXAM: MRA HEAD WITHOUT CONTRAST TECHNIQUE: Angiographic images of the Circle of Willis were obtained using MRA technique without intravenous contrast. COMPARISON:  MRI 06/20/2015 FINDINGS: Image quality degraded by motion. This level of motion obscures detail in small vessels. Both vertebral arteries patent to the basilar. PICA patent bilaterally. Basilar is patent. Superior cerebellar arteries patent bilaterally. Fetal origin of the right posterior cerebral artery. Internal carotid artery is patent bilaterally without significant stenosis. Anterior and middle cerebral arteries are patent bilaterally. No aneurysm identified IMPRESSION: Image quality degraded by mild to moderate motion. No large vessel occlusion. Electronically Signed   By: Franchot Gallo M.D.   On: 06/20/2015 18:10    Time Spent in minutes  25 minutes   Barton Dubois M.D on 06/22/2015 at 7:23 PM  Between 7am to 7pm - Pager [phone]  After 7pm go to [email or web address] - password [facility]  [facility] -  Office  [phone]

## 2015-06-22 NOTE — Progress Notes (Signed)
I met with pt, his wife, and daughter at bedside. We discussed an inpt rehab admission. All in agreement to admit. I have begun insurance authorization for a possible admit today. I discussed with Dr. Dyann Kief and RN. I will follow up today. [phone]

## 2015-06-22 NOTE — Progress Notes (Signed)
*  PRELIMINARY RESULTS* Vascular Ultrasound Carotid Duplex (Doppler) has been completed.  Findings suggest 1-39% right internal carotid artery stenosis and 60-79% left internal carotid artery stenosis. Vertebral arteries are patent with antegrade flow. There are no significant changes when compared to prior study from 01/2015.  06/22/2015 10:47 AM Maudry Mayhew, RVT, RDCS, RDMS

## 2015-06-22 NOTE — H&P (Signed)
Physical Medicine and Rehabilitation Admission H&P    Chief Complaint  Patient presents with  . Weakness    L sided  : HPI: Andrew [name] is a 82 y.o. right handed male with history of hypertension, CAD status post CABG maintain on aspirin 325 mg daily, prostate cancer, COPDWith remote tobacco abuse, diastolic congestive heart failure, Left carotid stenosis 60-79% identified January 2017. Per chart review patient lives with wife. Independent prior to admission using a cane. He does not drive. Wife can assist. Daughter and Rondall Allegra. Presented 06/20/2015 with left-sided weakness. MRI showed small acute nonhemorrhagic infarct posterior limb right internal capsule. Question tiny medial left parietal lobe infarct. MRA no large vessel occlusion. Patient did not receive TPA. Echocardiogram with ejection fraction of 45% grade 2 diastolic dysfunction and no wall motion abnormalities. Follow-up carotid Dopplers 06/22/2015 again shows 60-79% left ICA stenosis no significant changes compared to January 2017. Neurology follow-up presently maintained on aspirin. Tolerating a regular consistency diet.Physical and Occupational therapy evaluations completed with recommendations of physical medicine rehabilitation consult.Patient was admitted for a comprehensive rehabilitation program  ROS Constitutional: Negative for fever and chills.  HENT: Positive for hearing loss.  Eyes: complains of some dryness/blurriness in left eye  Respiratory: Negative for cough.   Increased shortness of breath with exertion  Cardiovascular: Positive for leg swelling. Negative for chest pain and palpitations.  Gastrointestinal: Positive for constipation. Negative for nausea and vomiting.   GERD  Genitourinary: Positive for urgency. Negative for dysuria and hematuria.  Musculoskeletal: Positive for back pain.  Skin: Negative for rash.  Neurological: Positive for weakness. Negative for seizures and headaches.    Psychiatric. Depression All other systems reviewed and are negative   Past Medical History  Diagnosis Date  . HYPERCHOLESTEROLEMIA   . CAD, ARTERY BYPASS GRAFT   . CAROTID ARTERY STENOSIS 03/07/2010    80%  . AORTIC STENOSIS   . GASTROESOPHAGEAL REFLUX DISEASE   . Skin cancer   . History of colonic polyps     1999, 2004  . Allergic rhinitis   . Thyroid nodule 05/2010    Abnormal biopsy, 82 year old patient and his wife have made informed decision to not pursue surgical resection. Potential risk including cancer has been thoroughly discussed with the patient.  . S/P excision of acoustic neuroma   . History of prostate cancer 2002    s/p treatment with seeds / radiation  . Lumbar spinal stenosis 07/13/2009  . COPD, mild ([place]) 12/22/2010  . Pancreatic cyst 01/29/2013    Noted on MRI scan from [facility] on 07/27/2011.  I reviewed report at patient request on 01-29-2013.  "Small cystic focus in the posterior pancreatic head.  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  Follow up MRI is advised. The main pancreatic duct remains normal in appearance."  Patient do  . CHF (congestive heart failure) ([place])   . Macular degeneration (senile) of retina 04/29/2014   Past Surgical History  Procedure Laterality Date  . Coronary artery bypass graft    . Transperineal implatation of palladium    . Cholecystectomy    . Shoulder surgery    . Total knee arthroplasty    . Cataract extraction    . Tonsillectomy and adenoidectomy  1932  . Craniectomy for excision of acoustic neuroma  1985  . Cataract extraction  2010  . Eye surgery     Family History  Problem Relation Age of Onset  . Emphysema Brother   . Lung cancer Brother   . Alcohol abuse    . Arthritis    . Cancer    . Macular degeneration    . Heart disease Father   . Hypertension Father   . Heart attack Father   . Colon cancer Sister    . Non-Hodgkin's lymphoma Sister   . Heart disease Brother   . Colon cancer Sister   . Skin cancer Sister   . Lung cancer Daughter    Social History:  reports that he quit smoking about 54 years ago. His smoking use included Cigarettes. He has a 40 pack-year smoking history. He has never used smokeless tobacco. He reports that he drinks about 4.2 oz of alcohol per week. He reports that he does not use illicit drugs. Allergies: No Known Allergies Medications Prior to Admission  Medication Sig Dispense Refill  . acetaminophen (TYLENOL) 500 MG tablet Take 500 mg by mouth every 6 (six) hours as needed (pain).    Marland Kitchen albuterol (PROVENTIL HFA;VENTOLIN HFA) 108 (90 BASE) MCG/ACT inhaler Inhale 2 puffs into the lungs every 6 (six) hours as needed for wheezing or shortness of breath. 1 Inhaler 3  . aspirin 325 MG tablet Take 325 mg by mouth daily.    . B Complex-C-Folic Acid (NEPHRO-VITE PO) Take 1 tablet by mouth daily.      . cholecalciferol (VITAMIN D) 400 units TABS tablet Take 400 Units by mouth daily.    [name] 10 MG tablet Take 1 tablet (10 mg total) by mouth daily. 90 tablet 1  . furosemide (LASIX) 40 MG tablet TAKE 1 TABLET BY MOUTH EVERY MORNING AND 1/2 TABEVERY EVENING (Patient taking differently: TAKE 1 TABLET BY MOUTH EVERY MORNING AND 1/2 TAB EVERY EVENING) 135 tablet 3  . isosorbide mononitrate (IMDUR) 60 MG 24 hr tablet Take 1.5 tablets (90 mg total) by mouth daily. 135 tablet 3  . metoprolol succinate (TOPROL-XL) 25 MG 24 hr tablet Take 1 tablet (25 mg total) by mouth daily. 90 tablet 3  . Multiple Vitamins-Minerals (PRESERVISION/LUTEIN) CAPS Take 1 capsule by mouth 2 (two) times daily.    . nitroGLYCERIN (NITROLINGUAL) 0.4 MG/SPRAY spray PLACE 1 SPRAY UNDER THE TONGUE EVERY 5 (FIVE) MINUTES AS NEEDED. (Patient taking differently: PLACE 1 SPRAY UNDER THE TONGUE EVERY 5 (FIVE) MINUTES AS NEEDED FOR CHEST PAIN) 4.9 g 12  . OVER THE COUNTER MEDICATION Place 1 drop into both  eyes daily as needed (dry eyes). OTC lubricating eye drop    . potassium chloride SA (K-DUR,KLOR-CON) 20 MEQ tablet TAKE 1 TABLET BY MOUTH DAILY. 90 tablet 2  . pravastatin (PRAVACHOL) 40 MG tablet TAKE 1 TABLET DAILY  ( CALL TO SCHEDULE AN 6 MONTHS APPOINTMENT FOR FUTURE REFILLS ) (Patient taking differently: TAKE 1 TABLET BY MOUTH DAILY WITH SUPPER ( CALL TO SCHEDULE AN 6 MONTHS APPOINTMENT FOR FUTURE REFILLS )) 90 tablet 3  . PRESCRIPTION MEDICATION Apply 1 application topically daily as needed (rash). Cream for unknown rash      Home: Home Living Family/patient expects to be discharged to:: Inpatient rehab Living Arrangements: Spouse/significant other Available Help at Discharge: Available 24 hours/day Type of Home: House Home Access: Stairs to enter [employer] of Steps: 4 Entrance Stairs-Rails: Right, Left, Can reach both Home Layout: One level Bathroom Shower/Tub: Walk-in shower (with 4 inch stepover, rails in bathroom) Bathroom Toilet: Standard Bathroom Accessibility: Yes Home Equipment: Walker - 2 wheels Additional Comments:  wife drives   Lives With: Spouse   Functional History: Prior Function Level of Independence: Independent Comments: HOH with r ear hearing aid, prior balance issues but no falls until day of admission  Functional Status:  Mobility: Bed Mobility Overal bed mobility: Needs Assistance Bed Mobility: Supine to Sit, Sit to Supine Rolling: Min guard Supine to sit: Min assist, HOB elevated Sit to supine: Min guard, HOB elevated General bed mobility comments: Assist with elevating trunk to get to EOB. NO assist to return to supine but assist to scoot up in bed. Transfers Overall transfer level: Needs assistance Equipment used: None Transfers: Sit to/from Stand Sit to Stand: Min assist General transfer comment: min assist to steady in standing. Stood from Personnel officer. Ambulation/Gait Ambulation/Gait assistance: Mod assist, +2  safety/equipment Ambulation Distance (Feet): 100 Feet (x2 bouts) Assistive device:  (rail in hallway) Gait Pattern/deviations: Step-through pattern, Step-to pattern, Decreased stride length, Decreased stance time - left, Ataxic General Gait Details: Cues to widen BoS and to decrease step length on left. Use rail for support and hand held assist for other UE support. 2 seated rest breaks. Ataxia improved with increased cadence.  Gait velocity: slow Gait velocity interpretation: Below normal speed for age/gender    ADL: ADL Overall ADL's : Needs assistance/impaired Eating/Feeding: Set up, Sitting Eating/Feeding Details (indicate cue type and reason): (A) with opening containers due to visual deficits. Pt able to self feed  Grooming: Wash/dry hands, Wash/dry face, Oral care, Sitting, Set up Grooming Details (indicate cue type and reason): used wet wipes to clear hands Upper Body Bathing: Minimal assitance, Sitting Lower Body Bathing: Minimal assistance, Sit to/from stand Upper Body Dressing : Minimal assistance, Sitting Lower Body Dressing: Set up, Sitting/lateral leans Lower Body Dressing Details (indicate cue type and reason): donned socks crossing foot over opposite knee Toilet Transfer: Moderate assistance, RW Toilet Transfer Details (indicate cue type and reason): narrowed base of support. pt has to stop and have (A) to correct posture priro to proceeding with transfer Toileting- Clothing Manipulation and Hygiene: Minimal assistance, Sit to/from stand Toileting - Clothing Manipulation Details (indicate cue type and reason): washed pericarea with set up and assist for standing balance Functional mobility during ADLs: Moderate assistance, Rolling walker General ADL Comments: pt supine on arrival and reports "i need new legs" when starting session. Pt shows awareness to deficits with balance.   Cognition: Cognition Overall Cognitive Status: Within Functional Limits for tasks  assessed Orientation Level: Oriented X4 Cognition Arousal/Alertness: Awake/alert Behavior During Therapy: WFL for tasks assessed/performed Overall Cognitive Status: Within Functional Limits for tasks assessed  Physical Exam: Blood pressure 127/52, pulse 70, temperature 98.5 F (36.9 C), temperature source Oral, resp. rate 16, height 5' 9.5" (1.765 m), weight 88.451 kg (195 lb), SpO2 97 %. Physical Exam Constitutional: He is oriented to person, place, and time. He appears well-developed and well-nourished.  HENT: fair dentition, oral mucosa pink and moist Head: Normocephalic and atraumatic.  Eyes: Conjunctivae and EOM are normal. Small polyp on left upper eye lid Neck: Normal range of motion. Neck supple. No thyromegaly present.  Cardiovascular: Normal rate and regular rhythm.  Respiratory: Effort normal and breath sounds normal. No respiratory distress.  GI: Soft. Bowel sounds are normal. He exhibits no distension.  Musculoskeletal: He exhibits no edema or tenderness.  Neurological: He is alert and oriented to person, place, and time.  Patient is a bit hard of hearing.  Follows full commands.  Good awareness of deficits Sensation intact to light touch in all  4's DTRs symmetric Left facial weakness (baseline) Motor: B/l UE 4-5+/5 delt,bic,tric,wrist,hand RLE: 4+-5/5 proximal to distal  LLE: Hip flexion 3/5, knee extension 4/5, ankle dorsi/plantar flexion 4+/5  Skin: Skin is warm and dry.  Psychiatric: He has a normal mood and affect. His behavior is normal   Results for orders placed or performed during the hospital encounter of 06/20/15 (from the past 48 hour(s))  Hemoglobin A1c     Status: None   Collection Time: 06/23/15  5:25 AM  Result Value Ref Range   Hgb A1c MFr Bld 5.4 4.8 - 5.6 %    Comment: (NOTE)         Pre-diabetes: 5.7 - 6.4         Diabetes: >6.4         Glycemic control for adults with diabetes: <7.0    Mean Plasma Glucose 108 mg/dL    Comment:  (NOTE) Performed At: [facility] [place], [place] [id number] Lindon Romp MD QP:[id number]   CBC     Status: Abnormal   Collection Time: 06/23/15  5:25 AM  Result Value Ref Range   WBC 7.8 4.0 - 10.5 K/uL   RBC 4.24 4.22 - 5.81 MIL/uL   Hemoglobin 14.6 13.0 - 17.0 g/dL   HCT 43.9 39.0 - 52.0 %   MCV 103.5 (H) 78.0 - 100.0 fL   MCH 34.4 (H) 26.0 - 34.0 pg   MCHC 33.3 30.0 - 36.0 g/dL   RDW 13.4 11.5 - 15.5 %   Platelets 80 (L) 150 - 400 K/uL    Comment: CONSISTENT WITH PREVIOUS RESULT  Basic metabolic panel     Status: Abnormal   Collection Time: 06/23/15  5:25 AM  Result Value Ref Range   Sodium 139 135 - 145 mmol/L   Potassium 4.1 3.5 - 5.1 mmol/L   Chloride 106 101 - 111 mmol/L   CO2 26 22 - 32 mmol/L   Glucose, Bld 88 65 - 99 mg/dL   BUN 23 (H) 6 - 20 mg/dL   Creatinine, Ser 1.43 (H) 0.61 - 1.24 mg/dL   Calcium 8.7 (L) 8.9 - 10.3 mg/dL   GFR calc non Af Amer 42 (L) >60 mL/min   GFR calc Af Amer 48 (L) >60 mL/min    Comment: (NOTE) The eGFR has been calculated using the CKD EPI equation. This calculation has not been validated in all clinical situations. eGFR's persistently <60 mL/min signify possible Chronic Kidney Disease.    Anion gap 7 5 - 15   No results found.     Medical Problem List and Plan: 1.  Left hemiparesis  secondary to nonhemorrhagic infarct posterior limb right internal capsule  -admit to inpatient rehab 2.  DVT Prophylaxis/Anticoagulation: SCDs. Monitor for any signs of DVT 3. Pain Management: Tylenol as needed 4. Mood: Prozac 10 mg daily 5. Neuropsych: This patient is capable of making decisions on his own behalf. 6. Skin/Wound Care: Routine skin checks 7. Fluids/Electrolytes/Nutrition: Routine I&O with follow-up chemistries 8. Diastolic congestive heart failure. Lasix 40 mg daily. No signs of fluid overload. Weigh patient daily 9. Hypertension. Allow permissive hypertension. Patient on indoor 90 mg daily, Toprol  25 mg daily prior to admission. Resume as needed 10. CAD status post CABG. Continue aspirin 11. Hyperlipidemia. Pravachol 12. History of alcohol use. Patient consumes approximately 4.2 ounces of alcohol per week. Monitor and provide counseling  Post Admission Physician Evaluation: 1. Functional deficits secondary  to right PLIC infarct. 2. Patient is  admitted to receive collaborative, interdisciplinary care between the physiatrist, rehab nursing staff, and therapy team. 3. Patient's level of medical complexity and substantial therapy needs in context of that medical necessity cannot be provided at a lesser intensity of care such as a SNF. 4. Patient has experienced substantial functional loss from his/her baseline which was documented above under the "Functional History" and "Functional Status" headings.  Judging by the patient's diagnosis, physical exam, and functional history, the patient has potential for functional progress which will result in measurable gains while on inpatient rehab.  These gains will be of substantial and practical use upon discharge  in facilitating mobility and self-care at the household level. 5. Physiatrist will provide 24 hour management of medical needs as well as oversight of the therapy plan/treatment and provide guidance as appropriate regarding the interaction of the two. 6. 24 hour rehab nursing will assist with bladder management, bowel management, safety, skin/wound care, disease management, medication administration, pain management and patient education  and help integrate therapy concepts, techniques,education, etc. 7. PT will assess and treat for/with: Lower extremity strength, range of motion, stamina, balance, functional mobility, safety, adaptive techniques and equipment, NMR, stroke education, family ed, ego support.   Goals are: mod I. 8. OT will assess and treat for/with: ADL's, functional mobility, safety, upper extremity strength, adaptive techniques and  equipment, NMR, visual-spatial awareness, stroke ed, family e.   Goals are: mod I. Therapy may proceed with showering this patient. 9. SLP will assess and treat for/with: n/a.  Goals are: n/a. 10. Case Management and Social Worker will assess and treat for psychological issues and discharge planning. 11. Team conference will be held weekly to assess progress toward goals and to determine barriers to discharge. 12. Patient will receive at least 3 hours of therapy per day at least 5 days per week. 13. ELOS: 7-9 days       14. Prognosis:  excellent     Meredith Staggers, MD, [facility] 06/24/2015   06/24/2015

## 2015-06-22 NOTE — Progress Notes (Signed)
STROKE TEAM PROGRESS NOTE   SUBJECTIVE (INTERVAL HISTORY) Patient is doing well. She still has mild weakness but is able to walk with the help of physical therapist. He is reluctant but willing to go to rehabilitation   OBJECTIVE Temp:  [97.4 F (36.3 C)-98.3 F (36.8 C)] 98.3 F (36.8 C) (05/31 0948) Pulse Rate:  [50-71] 56 (05/31 0948) Cardiac Rhythm:  [-] Sinus bradycardia (05/31 0700) Resp:  [15-17] 17 (05/31 0948) BP: (125-162)/(45-59) 135/57 mmHg (05/31 0948) SpO2:  [94 %-98 %] 95 % (05/31 0948)  CBC:   Recent Labs Lab 06/20/15 1032 06/20/15 1058  WBC 7.2  --   NEUTROABS 4.8  --   HGB 14.9 15.6  HCT 45.6 46.0  MCV 105.1*  --   PLT 87*  --     Basic Metabolic Panel:   Recent Labs Lab 06/20/15 1032 06/20/15 1058  NA 139 140  K 4.3 4.2  CL 105 101  CO2 29  --   GLUCOSE 117* 109*  BUN 25* 27*  CREATININE 1.50* 1.40*  CALCIUM 9.2  --     Lipid Panel:     Component Value Date/Time   CHOL 115 06/21/2015 0538   TRIG 71 06/21/2015 0538   HDL 45 06/21/2015 0538   CHOLHDL 2.6 06/21/2015 0538   VLDL 14 06/21/2015 0538   LDLCALC 56 06/21/2015 0538   HgbA1c: No results found for: HGBA1C Urine Drug Screen:     Component Value Date/Time   LABOPIA NONE DETECTED 06/20/2015 1632   COCAINSCRNUR NONE DETECTED 06/20/2015 1632   LABBENZ NONE DETECTED 06/20/2015 1632   AMPHETMU NONE DETECTED 06/20/2015 1632   THCU NONE DETECTED 06/20/2015 1632   LABBARB NONE DETECTED 06/20/2015 1632      IMAGING  Mr Brain Wo Contrast (neuro Protocol)  06/20/2015  CLINICAL DATA:  82 year old male with left-sided deficits since yesterday. Fall twice since episode. History of vestibular schwannoma resection 1985. Initial encounter. EXAM: MRI HEAD WITHOUT CONTRAST TECHNIQUE: Multiplanar, multiecho pulse sequences of the brain and surrounding structures were obtained without intravenous contrast. COMPARISON:  05/04/2009. FINDINGS: Small acute nonhemorrhagic infarct posterior limb  right internal capsule. Question tiny medial left parietal lobe infarct. Marked chronic microvascular changes. Global moderate atrophy without hydrocephalus. No intracranial hemorrhage. Postsurgical changes left mastoid region. No obvious recurrent mass. Major intracranial vascular structures are patent. Narrowing left vertebral artery suspected. Post lens replacement. Paranasal sinus mucosal thickening/opacification most notable right maxillary sinus. Cervical medullary junction within normal limits. IMPRESSION: Small acute nonhemorrhagic infarct posterior limb right internal capsule. Question tiny medial left parietal lobe infarct. Marked chronic microvascular changes. Global moderate atrophy without hydrocephalus. No intracranial hemorrhage. Postsurgical changes left mastoid region. No obvious recurrent mass. Major intracranial vascular structures are patent. Narrowing left vertebral artery suspected. Paranasal sinus mucosal thickening/opacification most notable right maxillary sinus. Electronically Signed   By: Genia Del M.D.   On: 06/20/2015 12:38   Dg Chest Port 1 View  06/21/2015  CLINICAL DATA:  Right rib and chest wall pain. Fall 2 days ago. Initial encounter. EXAM: PORTABLE CHEST 1 VIEW COMPARISON:  10/27/2014 FINDINGS: Low lung volumes again noted. No evidence of pneumothorax or hemothorax. No evidence of acute infiltrate. Heart size remains stable. Prior CABG noted. IMPRESSION: Low lung volumes.  No acute findings. Electronically Signed   By: Earle Gell M.D.   On: 06/21/2015 13:35   Mr Jodene Nam Head/brain Wo Cm  06/20/2015  CLINICAL DATA:  Stroke.  Acute left-sided weakness. EXAM: MRA HEAD WITHOUT CONTRAST TECHNIQUE: Angiographic  images of the Circle of Willis were obtained using MRA technique without intravenous contrast. COMPARISON:  MRI 06/20/2015 FINDINGS: Image quality degraded by motion. This level of motion obscures detail in small vessels. Both vertebral arteries patent to the basilar. PICA  patent bilaterally. Basilar is patent. Superior cerebellar arteries patent bilaterally. Fetal origin of the right posterior cerebral artery. Internal carotid artery is patent bilaterally without significant stenosis. Anterior and middle cerebral arteries are patent bilaterally. No aneurysm identified IMPRESSION: Image quality degraded by mild to moderate motion. No large vessel occlusion. Electronically Signed   By: Franchot Gallo M.D.   On: 06/20/2015 18:10   2D Echocardiogram  - Left ventricle: The cavity size was normal. There was moderate focal basal hypertrophy of the septum. Systolic function was normal. The estimated ejection fraction was in the range of 50% to 55%. Wall motion was normal; there were no regional wall motion abnormalities. Features are consistent with a pseudonormal left ventricular filling pattern, with concomitant abnormal relaxation and increased filling pressure (grade 2 diastolic dysfunction). - Aortic valve: Valve mobility was restricted. There was moderate stenosis. There was moderate regurgitation. Peak velocity (S): 286 cm/s. Mean gradient (S): 20 mm Hg. Valve area (VTI): 1.72 cm^2. Valve area (Vmax): 1.6 cm^2. Valve area (Vmean): 1.63 cm^2. - Aorta: Aortic root dimension: 41 mm (ED). - Ascending aorta: The ascending aorta was mildly dilated. - Mitral valve: Valve area by pressure half-time: 1.9 cm^2. - Right atrium: The atrium was moderately dilated. - Tricuspid valve: There was mild regurgitation. - Pulmonic valve: There was moderate regurgitation. - Pulmonary arteries: Systolic pressure was mildly to moderately increased. PA peak pressure: 46 mm Hg (S).  Carotid Doppler   1-39% right internal carotid artery stenosis and 60-79% left internal carotid artery stenosis. Vertebral arteries are patent with antegrade flow. There are no significant changes when compared to prior study from 01/2015.   PHYSICAL EXAM Obese elderly caucasian male not in distress. . Afebrile.  Head is nontraumatic. Neck is supple without bruit.    Cardiac exam no murmur or gallop. Lungs are clear to auscultation. Distal pulses are well felt. Neurological Exam ;  Awake alert oriented x 3 normal speech and language. Mild left lower face asymmetry. Tongue midline. No drift. Mild diminished fine finger movements on left. Orbits right over left upper extremity. Mild left grip weak..Mild left hip flexor weakness 4/5. Normal sensation . Normal coordination.   ASSESSMENT/PLAN [name] is a 82 y.o. male with history of HLD, CAD, Prostate cancer, COPD, CHF presenting with LLE weakness and falls x 2 . He did not receive IV t-PA due to delay in arrival.   Stroke:  Non-dominant right PLIC infarct embolic secondary to small vessel disease  Resultant  Mild left leg paresis  MRI  R PLIC infarct, ? Tiny L parietal lobe infarct  MRA  No large vessel stenosis  Carotid Doppler  01/2015 R 1-39%, L 60-79% stenosis. Repeat in hospital without change. Planned OP repeat 08/22/2015  2D Echo  EF 50-55%. No source of embolus   LDL 56  HgbA1c ordered  SCDs for VTE prophylaxis Diet Heart Room service appropriate?: Yes; Fluid consistency:: Thin  aspirin 325 mg daily prior to admission, now on aspirin 325 mg daily  Patient counseled to be compliant with his antithrombotic medications  Ongoing aggressive stroke risk factor management  Therapy recommendations:  CIR. Consult placed  Disposition:  CLR  Hypertension  Stable Permissive hypertension (OK if < 220/120) but gradually normalize in 5-7  days  Hyperlipidemia  Home meds:  pravachol 40, not resumed in hospital  LDL 56, at goal < 70  Resume statin   Continue statin at discharge  Other Stroke Risk Factors  Advanced age  Former Cigarette smoker  ETOH use, presumed abuse and dependence at risk for withdrawal  Overweight, Body mass index is 28.39 kg/(m^2).   Coronary artery disease s/p CABG  Aortic  stenosis  CHF  Other Active Problems  CKD stage IIIb  Hyperbilirubinemia, In the setting of possible chronic liver disease  Macrocytosis  Thrombocytopenia, Plt 87  COPD  depression  Hospital day # 2  I have personally examined this patient, reviewed notes, independently viewed imaging studies, participated in medical decision making and plan of care. I have made any additions or clarifications directly to the above note. Agree with note above.  Continue aspirin for stroke prevention. Transfer to inpatient rehabilitation when bed available. Stroke team will sign off. Follow-up as an outpatient in stroke clinic in 2 months. Kindly call for questions.   Antony Contras, MD Medical Director [place] Pager: [phone] 06/22/2015 10:02 AM     To contact Stroke Continuity provider, please refer to [email or web address] After hours, contact General Neurology

## 2015-06-22 NOTE — Clinical Social Work Note (Signed)
CSW spoke to patient and his wife regarding SNF placement and bed offers.  Patient and wife would like [facility] if CIR is denied.  CSW to follow up with patient and his wife in the morning.  CSW to continue to follow patient's progress throughout discharge planning.  Jones Broom. [place], MSW, [place] 06/22/2015 5:33 PM

## 2015-06-23 ENCOUNTER — Inpatient Hospital Stay (HOSPITAL_COMMUNITY): Payer: Medicare Other | Admitting: Physical Therapy

## 2015-06-23 DIAGNOSIS — E7800 Pure hypercholesterolemia, unspecified: Secondary | ICD-10-CM

## 2015-06-23 LAB — CBC
HEMATOCRIT: 43.9 % (ref 39.0–52.0)
HEMOGLOBIN: 14.6 g/dL (ref 13.0–17.0)
MCH: 34.4 pg — ABNORMAL HIGH (ref 26.0–34.0)
MCHC: 33.3 g/dL (ref 30.0–36.0)
MCV: 103.5 fL — ABNORMAL HIGH (ref 78.0–100.0)
Platelets: 80 10*3/uL — ABNORMAL LOW (ref 150–400)
RBC: 4.24 MIL/uL (ref 4.22–5.81)
RDW: 13.4 % (ref 11.5–15.5)
WBC: 7.8 10*3/uL (ref 4.0–10.5)

## 2015-06-23 LAB — BASIC METABOLIC PANEL
ANION GAP: 7 (ref 5–15)
BUN: 23 mg/dL — ABNORMAL HIGH (ref 6–20)
CALCIUM: 8.7 mg/dL — AB (ref 8.9–10.3)
CHLORIDE: 106 mmol/L (ref 101–111)
CO2: 26 mmol/L (ref 22–32)
Creatinine, Ser: 1.43 mg/dL — ABNORMAL HIGH (ref 0.61–1.24)
GFR calc non Af Amer: 42 mL/min — ABNORMAL LOW (ref >60)
GFR, EST AFRICAN AMERICAN: 48 mL/min — AB (ref >60)
Glucose, Bld: 88 mg/dL (ref 65–99)
Potassium: 4.1 mmol/L (ref 3.5–5.1)
Sodium: 139 mmol/L (ref 135–145)

## 2015-06-23 MED ORDER — VITAMIN B-12 1000 MCG PO TABS
1000.0000 ug | ORAL_TABLET | Freq: Every day | ORAL | Status: DC
  Administered 2015-06-24: 1000 ug via ORAL
  Filled 2015-06-23: qty 1

## 2015-06-23 NOTE — Progress Notes (Signed)
PROGRESS NOTE                                                                                                                                                                                                             Patient Demographics:    Andrew [name], is a 82 y.o. male, DOB - 1924/01/12, [id number]  Admit date - 06/20/2015   Admitting Physician Waldemar Dickens, MD  Outpatient Primary MD for the patient is [name], MD  LOS - 3  Chief Complaint  Patient presents with  . Weakness    L sided       Brief Narrative   82 y.o. male with  history of HTN, HLD, CAD s/p CABG, COPD, CHF, presenting with progressive left sided weakness accompanied by falls x2 ,MRI showing acute right ICA infarct, admitted for further CVA workup. Will need CIR vs SNF for rehab.   Subjective:    Andrew [name] today has, No headache, No abdominal pain - Complaints of right side rib chest pain  from fall and still have some left paresis.  Assessment  & Plan :    Active Problems:   HYPERCHOLESTEROLEMIA   Hx of CABG   PVD- moderate carotid disease   COPD, minimal-mild   Chronic diastolic CHF (congestive heart failure) (HCC)   Pseudodementia   Chronic renal insufficiency, stage III (moderate)   CVA (cerebral infarction)   Stroke (HCC)   Macrocytosis without anemia   Hyperbilirubinemia   Cerebral infarction due to unspecified mechanism   Benign essential HTN   Coronary artery disease involving coronary bypass graft of native heart without angina pectoris   Bradycardia   Thrombocytopenia (HCC)   Right-sided cerebrovascular accident (CVA) ([place])   Alcohol abuse   Acute CVA - MRI brain Small acute nonhemorrhagic infarct posterior limb right internal Capsule, Question tiny medial left parietal lobe infarct. - Carotid Doppler in January showing R 1-39%, L 60-79% stenosis, repeated carotid duplex during this admission also demonstrating  1-39% right internal  carotid artery stenosis and 60-79% left internal carotid artery stenosis. Vertebral arteries are patent with antegrade flow (unchanged) - 2-D echo (with normal EF; grade 2 diastolic dysfunction, no wall motion abnormalities and no source of emboli)  - LDL is 56 - A1c still pending - On aspirin 325 mg oral daily for secondary prevention - Plan  for CIR most likely tomorrow vs SNF for rehab  Hypertension - allowing  for permissive hypertension in acute ischemic setting -will resume BP meds in am  Hyperlipidemia - LDL 56, at goal < 70,  -continue on Pravachol   CAD -s/p CABG - aspirin and statin -will resume on metoprolol and Imdur in am  Chronic diastolic heart failure: -euvolemic and compensated -daily weights, low sodium diet and BP control  Chronic kidney disease stage III -At Baseline, continue to monitor  Presumed Alcohol abuse and dependence  -Continue CIWA protocol -continue thiamine and Folic acid  Thrombocytopenia  -Likely chronic, presumed associated with chronic ETOH use -No bleeding issues noted. -will monitor CBC in am  COPD -Continue with home meds -Stable, no wheezing or SOB on exam  Depression -Continue with Prozac -Stable and no SI or hallucinations   Code Status : Full  Family Communication  : Wife at bedside  Disposition Plan  : CIR vs SNF (in case insurance denies CIR)  Consults  :  Neurology, CIR  Procedures  : none  DVT Prophylaxis  :  SCDs   Lab Results  Component Value Date   PLT 80* 06/23/2015    Antibiotics  :    Anti-infectives    None        Objective:   Filed Vitals:   06/22/15 2137 06/23/15 0120 06/23/15 0610 06/23/15 1056  BP: 141/45 152/52 131/43 148/81  Pulse: 68 72 70 66  Temp: 98.2 F (36.8 C) 98.2 F (36.8 C) 97.7 F (36.5 C) 99.2 F (37.3 C)  TempSrc: Oral Oral Oral Oral  Resp: 16 16 16 16   Height:      Weight:      SpO2: 97% 98% 98% 97%    Wt Readings from Last 3 Encounters:  06/20/15 88.451 kg  (195 lb)  05/02/15 88.792 kg (195 lb 12 oz)  02/21/15 87.998 kg (194 lb)     Intake/Output Summary (Last 24 hours) at 06/23/15 1410 Last data filed at 06/23/15 1233  Gross per 24 hour  Intake    720 ml  Output   2300 ml  Net  -1580 ml     Physical Exam Awake Alert, Oriented X 3, in no major distress and denying CP or SOB. No fever and no new focal neurologic deficit appreciated [place].AT,PERRLA; hard of hearing (hearing aids in place) Supple Neck,No JVD, No cervical lymphadenopathy appriciated.  Symmetrical Chest wall movement, Good air movement bilaterally, CTAB RRR,No Gallops, Rubs or new Murmurs, No Parasternal Heave +ve B.Sounds, Abd Soft, No tenderness,  No Cyanosis, Clubbing or edema, No new Rash or bruise      Data Review:    CBC  Recent Labs Lab 06/20/15 1032 06/20/15 1058 06/23/15 0525  WBC 7.2  --  7.8  HGB 14.9 15.6 14.6  HCT 45.6 46.0 43.9  PLT 87*  --  80*  MCV 105.1*  --  103.5*  MCH 34.3*  --  34.4*  MCHC 32.7  --  33.3  RDW 13.6  --  13.4  LYMPHSABS 1.3  --   --   MONOABS 0.8  --   --   EOSABS 0.3  --   --   BASOSABS 0.0  --   --     Chemistries   Recent Labs Lab 06/20/15 1032 06/20/15 1058 06/23/15 0525  NA 139 140 139  K 4.3 4.2 4.1  CL 105 101 106  CO2 29  --  26  GLUCOSE 117* 109* 88  BUN 25* 27* 23*  CREATININE 1.50* 1.40* 1.43*  CALCIUM 9.2  --  8.7*  AST 46*  --   --   ALT 43  --   --   ALKPHOS 68  --   --   BILITOT 1.5*  --   --    ------------------------------------------------------------------------------------------------------------------  Recent Labs  06/21/15 0538  CHOL 115  HDL 45  LDLCALC 56  TRIG 71  CHOLHDL 2.6   Coagulation profile  Recent Labs Lab 06/20/15 1032  INR 1.11   --------------------------------------------------------------------------------------- No results found for: BNP  Inpatient Medications  Scheduled Meds: . aspirin  300 mg Rectal Daily   Or  . aspirin  325 mg Oral Daily    . FLUoxetine  10 mg Oral Daily  . furosemide  40 mg Oral Daily  . multivitamin  1 tablet Oral Daily  . potassium chloride SA  20 mEq Oral Daily  . pravastatin  40 mg Oral Daily  . thiamine  100 mg Oral Daily   Or  . thiamine  100 mg Intravenous Daily   Continuous Infusions: . sodium chloride 50 mL/hr at 06/21/15 1726   PRN Meds:.acetaminophen, albuterol, hydrALAZINE, LORazepam **OR** LORazepam, senna-docusate  Radiology Reports Mr Brain Wo Contrast (neuro Protocol)  06/20/2015  CLINICAL DATA:  82 year old male with left-sided deficits since yesterday. Fall twice since episode. History of vestibular schwannoma resection 1985. Initial encounter. EXAM: MRI HEAD WITHOUT CONTRAST TECHNIQUE: Multiplanar, multiecho pulse sequences of the brain and surrounding structures were obtained without intravenous contrast. COMPARISON:  05/04/2009. FINDINGS: Small acute nonhemorrhagic infarct posterior limb right internal capsule. Question tiny medial left parietal lobe infarct. Marked chronic microvascular changes. Global moderate atrophy without hydrocephalus. No intracranial hemorrhage. Postsurgical changes left mastoid region. No obvious recurrent mass. Major intracranial vascular structures are patent. Narrowing left vertebral artery suspected. Post lens replacement. Paranasal sinus mucosal thickening/opacification most notable right maxillary sinus. Cervical medullary junction within normal limits. IMPRESSION: Small acute nonhemorrhagic infarct posterior limb right internal capsule. Question tiny medial left parietal lobe infarct. Marked chronic microvascular changes. Global moderate atrophy without hydrocephalus. No intracranial hemorrhage. Postsurgical changes left mastoid region. No obvious recurrent mass. Major intracranial vascular structures are patent. Narrowing left vertebral artery suspected. Paranasal sinus mucosal thickening/opacification most notable right maxillary sinus. Electronically Signed    By: Genia Del M.D.   On: 06/20/2015 12:38   Dg Chest Port 1 View  06/21/2015  CLINICAL DATA:  Right rib and chest wall pain. Fall 2 days ago. Initial encounter. EXAM: PORTABLE CHEST 1 VIEW COMPARISON:  10/27/2014 FINDINGS: Low lung volumes again noted. No evidence of pneumothorax or hemothorax. No evidence of acute infiltrate. Heart size remains stable. Prior CABG noted. IMPRESSION: Low lung volumes.  No acute findings. Electronically Signed   By: Earle Gell M.D.   On: 06/21/2015 13:35   Mr Jodene Nam Head/brain Wo Cm  06/20/2015  CLINICAL DATA:  Stroke.  Acute left-sided weakness. EXAM: MRA HEAD WITHOUT CONTRAST TECHNIQUE: Angiographic images of the Circle of Willis were obtained using MRA technique without intravenous contrast. COMPARISON:  MRI 06/20/2015 FINDINGS: Image quality degraded by motion. This level of motion obscures detail in small vessels. Both vertebral arteries patent to the basilar. PICA patent bilaterally. Basilar is patent. Superior cerebellar arteries patent bilaterally. Fetal origin of the right posterior cerebral artery. Internal carotid artery is patent bilaterally without significant stenosis. Anterior and middle cerebral arteries are patent bilaterally. No aneurysm identified IMPRESSION: Image quality degraded by mild to moderate motion. No large vessel occlusion.  Electronically Signed   By: Franchot Gallo M.D.   On: 06/20/2015 18:10    Time Spent in minutes  25 minutes   Barton Dubois M.D on 06/23/2015 at 2:10 PM  Between 7am to 7pm - Pager - [phone]  After 7pm go to [email or web address] - password [email or web address]  [facility] -  Office  [phone]

## 2015-06-23 NOTE — Progress Notes (Signed)
Occupational Therapy Treatment Patient Details Name: Andrew [name] MRN: [id number] DOB: 1923-03-08 Today's Date: 06/23/2015    History of present illness 82 yo male with L side weakness. pt s/p fall x2 MRI (+) Small acute nonhemorrhagic infarct posterior limb R internal capsule PMH:HLD, CAD, Prostate, COPD, CHF, TIA 2015, macular degeneration( senile) of retina, shoulder surg, CABG, craniectomy for excision of acoustic neuroma    OT comments  Pt progressing steadily in ADL. Performed bathing, dressing and grooming with min assist from recliner. Fatigues with LB bathing and c/o back pain (chronic) with prolonged standing. Continues to demonstrate poor standing balance interfering with functional performance. Pt continues to be a good rehab candidate.  Follow Up Recommendations  CIR    Equipment Recommendations       Recommendations for Other Services      Precautions / Restrictions Precautions Precautions: Fall Precaution Comments: macular degeneration Restrictions Weight Bearing Restrictions: No       Mobility Bed Mobility               General bed mobility comments: pt in chair  Transfers Overall transfer level: Needs assistance Equipment used: Rolling walker (2 wheeled) Transfers: Sit to/from Stand Sit to Stand: Min assist         General transfer comment: min assist to steady from recliner, good hand placement    Balance     Sitting balance-Leahy Scale: Good       Standing balance-Leahy Scale: Poor (posterior lean, requires at least one hand on walker)                     ADL Overall ADL's : Needs assistance/impaired     Grooming: Wash/dry hands;Wash/dry face;Oral care;Sitting;Set up   Upper Body Bathing: Minimal assitance;Sitting   Lower Body Bathing: Minimal assistance;Sit to/from stand   Upper Body Dressing : Minimal assistance;Sitting   Lower Body Dressing: Set up;Sitting/lateral leans Lower Body Dressing Details (indicate cue type  and reason): donned socks crossing foot over opposite knee     Toileting- Clothing Manipulation and Hygiene: Minimal assistance;Sit to/from stand Toileting - Clothing Manipulation Details (indicate cue type and reason): washed pericarea with set up and assist for standing balance              Vision                 Additional Comments: pt legally blind in L eye and with macular degeneration in R, unable to read small print   Perception     Praxis      Cognition   Behavior During Therapy: [facility] for tasks assessed/performed Overall Cognitive Status: Within Functional Limits for tasks assessed                       Extremity/Trunk Assessment               Exercises     Shoulder Instructions       General Comments      Pertinent Vitals/ Pain       Pain Assessment: Faces Faces Pain Scale: Hurts little more Pain Location: chronic back with standing Pain Descriptors / Indicators: Aching Pain Intervention(s): Repositioned;Monitored during session  Home Living                                          Prior Functioning/Environment  Frequency Min 2X/week     Progress Toward Goals  OT Goals(current goals can now be found in the care plan section)  Progress towards OT goals: Progressing toward goals  Acute Rehab OT Goals Patient Stated Goal: return to home  Time For Goal Achievement: 07/05/15 Potential to Achieve Goals: Good  Plan Discharge plan remains appropriate    Co-evaluation                 End of Session Equipment Utilized During Treatment: Rolling walker   Activity Tolerance Patient tolerated treatment well   Patient Left in chair;with call bell/phone within reach;with chair alarm set   Nurse Communication          Time: [id number] OT Time Calculation (min): 42 min  Charges: OT General Charges $OT Visit: 1 Procedure OT Treatments $Self Care/Home Management : 38-52  mins  Malka So 06/23/2015, 9:35 AM  [phone]

## 2015-06-23 NOTE — Progress Notes (Signed)
I continue to do an expedited appeal to admit pt to inpt rehab with [facility] Pt and wife are aware. [id number]

## 2015-06-23 NOTE — Care Management Note (Signed)
Case Management Note  Patient Details  Name: Andrew [name] MRN: [id number] Date of Birth: 05-Nov-1923  Subjective/Objective:                    Action/Plan: Plan is CIR vs SNF. CM continuing to follow for d/c needs.   Expected Discharge Date:                  Expected Discharge Plan:  [place]  In-House Referral:     Discharge planning Services     Post Acute Care Choice:    Choice offered to:     DME Arranged:    DME Agency:     HH Arranged:    [place] Agency:     Status of Service:  In process, will continue to follow  Medicare Important Message Given:  Yes Date Medicare IM Given:    Medicare IM give by:    Date Additional Medicare IM Given:    Additional Medicare Important Message give by:     If discussed at [place] of Stay Meetings, dates discussed:    Additional Comments:  Pollie Friar, RN 06/23/2015, 3:23 PM

## 2015-06-23 NOTE — Progress Notes (Signed)
Physical Therapy Treatment Patient Details Name: Andrew [name] MRN: [id number] DOB: 08-22-1923 Today's Date: 06/23/2015    History of Present Illness 82 yo male with L side weakness. pt s/p fall x2 MRI (+) Small acute nonhemorrhagic infarct posterior limb R internal capsule PMH:HLD, CAD, Prostate, COPD, CHF, TIA 2015, macular degeneration( senile) of retina, shoulder surg, CABG, craniectomy for excision of acoustic neuroma     PT Comments    Patient progressing well towards PT goals. Highly motivated to improve mobility. Tolerated gait training with hand rail and handheld assist for support. Ataxia improved with increased cadence and cues for sequencing but still requires Mod A for support/safety. Great CIR candidate. Will follow.   Follow Up Recommendations  CIR     Equipment Recommendations  None recommended by PT    Recommendations for Other Services       Precautions / Restrictions Precautions Precautions: Fall Precaution Comments: macular degeneration Restrictions Weight Bearing Restrictions: No    Mobility  Bed Mobility Overal bed mobility: Needs Assistance Bed Mobility: Supine to Sit;Sit to Supine     Supine to sit: Min assist;HOB elevated Sit to supine: Min guard;HOB elevated   General bed mobility comments: Assist with elevating trunk to get to EOB. NO assist to return to supine but assist to scoot up in bed.  Transfers Overall transfer level: Needs assistance Equipment used: None Transfers: Sit to/from Stand Sit to Stand: Min assist         General transfer comment: min assist to steady in standing. Stood from Personnel officer.  Ambulation/Gait Ambulation/Gait assistance: Mod assist;+2 safety/equipment Ambulation Distance (Feet): 100 Feet (x2 bouts) Assistive device:  (rail in hallway) Gait Pattern/deviations: Step-through pattern;Step-to pattern;Decreased stride length;Decreased stance time - left;Ataxic   Gait velocity interpretation: Below normal speed  for age/gender General Gait Details: Cues to widen BoS and to decrease step length on left. Use rail for support and hand held assist for other UE support. 2 seated rest breaks. Ataxia improved with increased cadence.    Stairs            Wheelchair Mobility    Modified Rankin (Stroke Patients Only) Modified Rankin (Stroke Patients Only) Pre-Morbid Rankin Score: No significant disability Modified Rankin: Moderately severe disability     Balance Overall balance assessment: Needs assistance Sitting-balance support: Feet supported;No upper extremity supported Sitting balance-Leahy Scale: Good     Standing balance support: During functional activity;Single extremity supported Standing balance-Leahy Scale: Poor Standing balance comment: Posterior bias. Needs  UE support.                    Cognition Arousal/Alertness: Awake/alert Behavior During Therapy: WFL for tasks assessed/performed Overall Cognitive Status: Within Functional Limits for tasks assessed                      Exercises      General Comments General comments (skin integrity, edema, etc.): Spouse present during session.      Pertinent Vitals/Pain Pain Assessment: Faces Faces Pain Scale: Hurts little more Pain Location: back-chronic Pain Descriptors / Indicators: Aching;Sore Pain Intervention(s): Monitored during session;Repositioned    Home Living                      Prior Function            PT Goals (current goals can now be found in the care plan section) Progress towards PT goals: Progressing toward goals    Frequency  Min 4X/week    PT Plan Current plan remains appropriate    Co-evaluation             End of Session Equipment Utilized During Treatment: Gait belt Activity Tolerance: Patient tolerated treatment well Patient left: in bed;with call bell/phone within reach;with bed alarm set;with family/visitor present     Time: [id number] PT Time  Calculation (min) (ACUTE ONLY): 30 min  Charges:  $Gait Training: 23-37 mins                    G Codes:      Alivea Gladson A Aldean Pipe 06/23/2015, 3:55 PM Wray Kearns, [place], DPT [phone]

## 2015-06-24 ENCOUNTER — Inpatient Hospital Stay (HOSPITAL_COMMUNITY)
Admission: RE | Admit: 2015-06-24 | Discharge: 2015-07-02 | DRG: 057 | Disposition: A | Discharge status: Home / Self Care | Payer: Medicare Other | Source: Intra-hospital | Attending: Physical Medicine & Rehabilitation | Admitting: Physical Medicine & Rehabilitation

## 2015-06-24 DIAGNOSIS — E785 Hyperlipidemia, unspecified: Secondary | ICD-10-CM | POA: Diagnosis present

## 2015-06-24 DIAGNOSIS — I2510 Atherosclerotic heart disease of native coronary artery without angina pectoris: Secondary | ICD-10-CM | POA: Diagnosis present

## 2015-06-24 DIAGNOSIS — Z87891 Personal history of nicotine dependence: Secondary | ICD-10-CM | POA: Diagnosis not present

## 2015-06-24 DIAGNOSIS — I639 Cerebral infarction, unspecified: Secondary | ICD-10-CM | POA: Diagnosis not present

## 2015-06-24 DIAGNOSIS — I110 Hypertensive heart disease with heart failure: Secondary | ICD-10-CM | POA: Diagnosis present

## 2015-06-24 DIAGNOSIS — Z7982 Long term (current) use of aspirin: Secondary | ICD-10-CM

## 2015-06-24 DIAGNOSIS — I69354 Hemiplegia and hemiparesis following cerebral infarction affecting left non-dominant side: Principal | ICD-10-CM

## 2015-06-24 DIAGNOSIS — K5900 Constipation, unspecified: Secondary | ICD-10-CM | POA: Diagnosis present

## 2015-06-24 DIAGNOSIS — H04129 Dry eye syndrome of unspecified lacrimal gland: Secondary | ICD-10-CM | POA: Diagnosis present

## 2015-06-24 DIAGNOSIS — I25810 Atherosclerosis of coronary artery bypass graft(s) without angina pectoris: Secondary | ICD-10-CM

## 2015-06-24 DIAGNOSIS — R32 Unspecified urinary incontinence: Secondary | ICD-10-CM | POA: Diagnosis present

## 2015-06-24 DIAGNOSIS — H3530 Unspecified macular degeneration: Secondary | ICD-10-CM | POA: Diagnosis present

## 2015-06-24 DIAGNOSIS — Z951 Presence of aortocoronary bypass graft: Secondary | ICD-10-CM | POA: Diagnosis not present

## 2015-06-24 DIAGNOSIS — H9190 Unspecified hearing loss, unspecified ear: Secondary | ICD-10-CM | POA: Diagnosis present

## 2015-06-24 DIAGNOSIS — F329 Major depressive disorder, single episode, unspecified: Secondary | ICD-10-CM | POA: Diagnosis present

## 2015-06-24 DIAGNOSIS — J449 Chronic obstructive pulmonary disease, unspecified: Secondary | ICD-10-CM | POA: Diagnosis present

## 2015-06-24 DIAGNOSIS — H04123 Dry eye syndrome of bilateral lacrimal glands: Secondary | ICD-10-CM | POA: Insufficient documentation

## 2015-06-24 DIAGNOSIS — I5032 Chronic diastolic (congestive) heart failure: Secondary | ICD-10-CM | POA: Diagnosis present

## 2015-06-24 DIAGNOSIS — R079 Chest pain, unspecified: Secondary | ICD-10-CM

## 2015-06-24 DIAGNOSIS — K219 Gastro-esophageal reflux disease without esophagitis: Secondary | ICD-10-CM | POA: Diagnosis present

## 2015-06-24 DIAGNOSIS — Z79899 Other long term (current) drug therapy: Secondary | ICD-10-CM

## 2015-06-24 DIAGNOSIS — G8194 Hemiplegia, unspecified affecting left nondominant side: Secondary | ICD-10-CM

## 2015-06-24 DIAGNOSIS — I10 Essential (primary) hypertension: Secondary | ICD-10-CM | POA: Diagnosis not present

## 2015-06-24 DIAGNOSIS — I69393 Ataxia following cerebral infarction: Secondary | ICD-10-CM

## 2015-06-24 LAB — HEMOGLOBIN A1C
Hgb A1c MFr Bld: 5.4 % (ref 4.8–5.6)
MEAN PLASMA GLUCOSE: 108 mg/dL

## 2015-06-24 MED ORDER — CYANOCOBALAMIN 1000 MCG PO TABS: 1000.0000 ug | ORAL_TABLET | Freq: Every day | ORAL | Status: DC

## 2015-06-24 MED ORDER — VITAMIN B-1 100 MG PO TABS
100.0000 mg | ORAL_TABLET | Freq: Every day | ORAL | Status: DC
  Administered 2015-06-25 – 2015-07-02 (×8): 100 mg via ORAL
  Filled 2015-06-24 (×8): qty 1

## 2015-06-24 MED ORDER — SORBITOL 70 % SOLN: 30.0000 mL | Freq: Every day | Status: DC | PRN

## 2015-06-24 MED ORDER — SENNOSIDES-DOCUSATE SODIUM 8.6-50 MG PO TABS
1.0000 | ORAL_TABLET | Freq: Every evening | ORAL | Status: DC | PRN
  Administered 2015-06-26: 1 via ORAL
  Filled 2015-06-24: qty 1

## 2015-06-24 MED ORDER — RENA-VITE PO TABS
1.0000 | ORAL_TABLET | Freq: Every day | ORAL | Status: DC
  Administered 2015-06-25 – 2015-07-02 (×8): 1 via ORAL
  Filled 2015-06-24 (×8): qty 1

## 2015-06-24 MED ORDER — LORAZEPAM 2 MG/ML IJ SOLN
1.0000 mg | Freq: Four times a day (QID) | INTRAMUSCULAR | Status: AC | PRN
Start: 2015-06-24 — End: 2015-06-25

## 2015-06-24 MED ORDER — SENNOSIDES-DOCUSATE SODIUM 8.6-50 MG PO TABS: 1.0000 | ORAL_TABLET | Freq: Every evening | ORAL | Status: DC | PRN

## 2015-06-24 MED ORDER — FUROSEMIDE 40 MG PO TABS
40.0000 mg | ORAL_TABLET | Freq: Every day | ORAL | Status: DC
  Administered 2015-06-25 – 2015-07-02 (×8): 40 mg via ORAL
  Filled 2015-06-24 (×8): qty 1

## 2015-06-24 MED ORDER — EYE LUBRICANT OP OINT: TOPICAL_OINTMENT | OPHTHALMIC | Status: DC

## 2015-06-24 MED ORDER — ASPIRIN 325 MG PO TABS
325.0000 mg | ORAL_TABLET | Freq: Every day | ORAL | Status: DC
  Administered 2015-06-25 – 2015-07-02 (×8): 325 mg via ORAL
  Filled 2015-06-24 (×8): qty 1

## 2015-06-24 MED ORDER — POTASSIUM CHLORIDE CRYS ER 20 MEQ PO TBCR
20.0000 meq | EXTENDED_RELEASE_TABLET | Freq: Every day | ORAL | Status: DC
  Administered 2015-06-25 – 2015-07-02 (×8): 20 meq via ORAL
  Filled 2015-06-24 (×8): qty 1

## 2015-06-24 MED ORDER — ONDANSETRON HCL 4 MG PO TABS: 4.0000 mg | ORAL_TABLET | Freq: Four times a day (QID) | ORAL | Status: DC | PRN

## 2015-06-24 MED ORDER — ALBUTEROL SULFATE (2.5 MG/3ML) 0.083% IN NEBU: 2.5000 mg | INHALATION_SOLUTION | Freq: Four times a day (QID) | RESPIRATORY_TRACT | Status: DC | PRN

## 2015-06-24 MED ORDER — PRAVASTATIN SODIUM 20 MG PO TABS
40.0000 mg | ORAL_TABLET | Freq: Every day | ORAL | Status: DC
  Administered 2015-06-25 – 2015-07-02 (×8): 40 mg via ORAL
  Filled 2015-06-24 (×3): qty 2
  Filled 2015-06-24: qty 1
  Filled 2015-06-24: qty 2
  Filled 2015-06-24: qty 1
  Filled 2015-06-24: qty 2
  Filled 2015-06-24: qty 1

## 2015-06-24 MED ORDER — ASPIRIN 300 MG RE SUPP: 300.0000 mg | Freq: Every day | RECTAL | Status: DC

## 2015-06-24 MED ORDER — FUROSEMIDE 40 MG PO TABS: 40.0000 mg | ORAL_TABLET | Freq: Every day | ORAL | Status: DC

## 2015-06-24 MED ORDER — ONDANSETRON HCL 4 MG/2ML IJ SOLN: 4.0000 mg | Freq: Four times a day (QID) | INTRAMUSCULAR | Status: DC | PRN

## 2015-06-24 MED ORDER — ACETAMINOPHEN 325 MG PO TABS
650.0000 mg | ORAL_TABLET | ORAL | Status: DC | PRN
  Administered 2015-06-26 – 2015-07-01 (×3): 650 mg via ORAL
  Filled 2015-06-24 (×3): qty 2

## 2015-06-24 MED ORDER — FLUOXETINE HCL 10 MG PO CAPS
10.0000 mg | ORAL_CAPSULE | Freq: Every day | ORAL | Status: DC
  Administered 2015-06-25 – 2015-07-02 (×8): 10 mg via ORAL
  Filled 2015-06-24 (×10): qty 1

## 2015-06-24 MED ORDER — VITAMIN B-12 1000 MCG PO TABS
1000.0000 ug | ORAL_TABLET | Freq: Every day | ORAL | Status: DC
  Administered 2015-06-25 – 2015-07-02 (×8): 1000 ug via ORAL
  Filled 2015-06-24 (×8): qty 1

## 2015-06-24 MED ORDER — LORAZEPAM 1 MG PO TABS: 1.0000 mg | ORAL_TABLET | Freq: Four times a day (QID) | ORAL | Status: AC | PRN

## 2015-06-24 MED ORDER — THIAMINE HCL 100 MG PO TABS: 100.0000 mg | ORAL_TABLET | Freq: Every day | ORAL | Status: DC

## 2015-06-24 NOTE — Discharge Summary (Signed)
Physician Discharge Summary  Andrew [name] [id number] DOB: Sep 15, 1923 DOA: 06/20/2015  PCP: [name], MD  Admit date: 06/20/2015 Discharge date: 06/24/2015  Time spent: 35 minutes  Recommendations for Outpatient Follow-up:  Intermittent evaluation of BMET to follow electrolytes and renal function  Repeat CBC in 1 week to follow platelets and Hgb trend   Discharge Diagnoses:  Active Problems:   HYPERCHOLESTEROLEMIA   Hx of CABG   PVD- moderate carotid disease   COPD, minimal-mild   Chronic diastolic CHF (congestive heart failure) (HCC)   Pseudodementia   Chronic renal insufficiency, stage III (moderate)   CVA (cerebral infarction)   Stroke (HCC)   Macrocytosis without anemia   Hyperbilirubinemia   Cerebral infarction due to unspecified mechanism   Benign essential HTN   Coronary artery disease involving coronary bypass graft of native heart without angina pectoris   Bradycardia   Thrombocytopenia (HCC)   Right-sided cerebrovascular accident (CVA) ([place])   Alcohol abuse   Discharge Condition: stable and improved. Patient will be discharge to CIR for rehabilitation. Outpatient Follow up in 2 months with stroke team as an outpatient.   Diet recommendation: heart healthy diet   Filed Weights   06/20/15 1007  Weight: 88.451 kg (195 lb)    History of present illness:  As per Dr. Marily Memos H&P written on 06/20/15 82 y.o. male with medical history significant for HTN, HLD, CAD s/p CABG, COPD, CHF, presenting to the ED with one day history of progressive left sided weakness accompanied by falls x2 . He never had a similar episode. Denies vertigo dizziness or vision changes. Denies headaches. No dysarthria. No dysphagia. No confusion. No seizures. No new urinary symptoms (known urge incontinence) Denies any chest pain, or shortness of breath. Denies any fever or chills, or night sweats.Does not smoke. No new meds or hormonal supplements. Does take a regular ASA a day, with no  other antiplatelets or anticoagulants. Patient is compliant with his medications. Denies any recent long distance trips. No recent surgeries. No sick contacts. No new stressors present at work and in personal life. He is very active, exercising daily. He is not a diabetic. No family history of stroke but does have a history of TIA in 2015. Patient was not administered TPA as is beyond time window for treatment consideration.   Hospital Course:  Acute CVA - MRI brain Small acute nonhemorrhagic infarct posterior limb right internal Capsule, Question tiny medial left parietal lobe infarct. - Carotid Doppler in January showing R 1-39%, L 60-79% stenosis, repeated carotid duplex during this admission also demonstrating 1-39% right internal carotid artery stenosis and 60-79% left internal carotid artery stenosis. Vertebral arteries are patent with antegrade flow (unchanged) - 2-D echo (with normal EF; grade 2 diastolic dysfunction, no wall motion abnormalities and no source of emboli identified)  - LDL is 56 - A1c 5.4 - On aspirin 325 mg oral daily  - Plan is to discharge patient to CIR for rehab  Hypertension -stable and well controlled -continue home antihypertensive regimen   Hyperlipidemia - LDL 56, at goal < 70,  -continue on Pravachol   CAD -s/p CABG in the past -continue aspirin and statin -will continue home medication regimen -no CP  Chronic diastolic heart failure: -euvolemic and compensated -daily weights, low sodium diet and BP control recommended  -continue home lasix dose (adjusted to 40mg  daily base on current volume status) -follow electrolytes and replete as needed   Chronic kidney disease stage III -At Baseline, continue to monitor intermittently  Presumed Alcohol abuse and dependence  -cessation counseling provided -patient felt alcohol is not really at problem -will continue folic acid, thiamine and B12 -no withdrawal symptoms appreciated during admission    Thrombocytopenia  -Likely chronic, presumed associated with chronic ETOH use -No overt bleeding appreciated. -recommend intermittent monitoring CBC  COPD -Continue with home meds -stable, no wheezing and w/o complaints of SOB on exam  Depression - Continue with Prozac -stable and no SI or hallucinations    Procedures:  2-D echo:EF 50-55%. No source of embolus   MRI R PLIC infarct, ? Tiny L parietal lobe infarct  MRA No large vessel stenosis  Carotid Doppler 01/2015 R 1-39%, L 60-79% stenosis. Repeat in hospital without change. Planned OP repeat 08/22/2015  Consultations:  Neurology   CIR  Discharge Exam: Filed Vitals:   06/24/15 0906 06/24/15 1323  BP: 143/58 127/52  Pulse: 98 70  Temp: 98.6 F (37 C) 98.5 F (36.9 C)  Resp: 20 16   Awake Alert, Oriented X 3, in no distress and denying CP or SOB. No fever and no new focal neurologic deficit appreciated. Reports some eye dryness symptoms. [place].AT,PERRLA; hard of hearing (hearing aids in place) Supple Neck,No JVD, No cervical lymphadenopathy appriciated.  Symmetrical Chest wall movement, Good air movement bilaterally, CTAB RRR,No Gallops, Rubs or new Murmurs, No Parasternal Heave +ve B.Sounds, Abd Soft, No tenderness,  No Cyanosis, Clubbing or edema, No new Rash or bruise    Discharge Instructions   Discharge Instructions    Diet - low sodium heart healthy    Complete by:  As directed      Discharge instructions    Complete by:  As directed   Medication as prescribed Follow volume status and BP; adjust antihypertensive drugs and diuretics as needed          Current Discharge Medication List    START taking these medications   Details  Artificial Tear Ointment (EYE LUBRICANT) OINT Apply bilaterally at bedtime to help with eye dryness symptoms Refills: 0    senna-docusate (SENOKOT-S) 8.6-50 MG tablet Take 1 tablet by mouth at bedtime as needed for moderate constipation.    thiamine 100 MG  tablet Take 1 tablet (100 mg total) by mouth daily.    vitamin B-12 1000 MCG tablet Take 1 tablet (1,000 mcg total) by mouth daily.      CONTINUE these medications which have CHANGED   Details  furosemide (LASIX) 40 MG tablet Take 1 tablet (40 mg total) by mouth daily.      CONTINUE these medications which have NOT CHANGED   Details  acetaminophen (TYLENOL) 500 MG tablet Take 500 mg by mouth every 6 (six) hours as needed (pain).    albuterol (PROVENTIL HFA;VENTOLIN HFA) 108 (90 BASE) MCG/ACT inhaler Inhale 2 puffs into the lungs every 6 (six) hours as needed for wheezing or shortness of breath. Qty: 1 Inhaler, Refills: 3   Associated Diagnoses: Bronchospasm; Fatigue; Cough; History of smoking; Asthmatic bronchitis    aspirin 325 MG tablet Take 325 mg by mouth daily.    B Complex-C-Folic Acid (NEPHRO-VITE PO) Take 1 tablet by mouth daily.      cholecalciferol (VITAMIN D) 400 units TABS tablet Take 400 Units by mouth daily.    FLUoxetine (PROZAC) 10 MG tablet Take 1 tablet (10 mg total) by mouth daily. Qty: 90 tablet, Refills: 1    isosorbide mononitrate (IMDUR) 60 MG 24 hr tablet Take 1.5 tablets (90 mg total) by mouth daily. Qty: 135 tablet,  Refills: 3    metoprolol succinate (TOPROL-XL) 25 MG 24 hr tablet Take 1 tablet (25 mg total) by mouth daily. Qty: 90 tablet, Refills: 3    Multiple Vitamins-Minerals (PRESERVISION/LUTEIN) CAPS Take 1 capsule by mouth 2 (two) times daily.    nitroGLYCERIN (NITROLINGUAL) 0.4 MG/SPRAY spray PLACE 1 SPRAY UNDER THE TONGUE EVERY 5 (FIVE) MINUTES AS NEEDED. Qty: 4.9 g, Refills: 12    OVER THE COUNTER MEDICATION Place 1 drop into both eyes daily as needed (dry eyes). OTC lubricating eye drop    potassium chloride SA (K-DUR,KLOR-CON) 20 MEQ tablet TAKE 1 TABLET BY MOUTH DAILY. Qty: 90 tablet, Refills: 2    pravastatin (PRAVACHOL) 40 MG tablet TAKE 1 TABLET DAILY  ( CALL TO SCHEDULE AN 6 MONTHS APPOINTMENT FOR FUTURE REFILLS ) Qty: 90 tablet,  Refills: 3    PRESCRIPTION MEDICATION Apply 1 application topically daily as needed (rash). Cream for unknown rash       No Known Allergies Follow-up Information    Follow up with Antony Contras, MD.   Specialties:  Neurology, Radiology   Contact information:   [address] [place] [address] [phone]       The results of significant diagnostics from this hospitalization (including imaging, microbiology, ancillary and laboratory) are listed below for reference.    Significant Diagnostic Studies: Mr Brain Wo Contrast (neuro Protocol)  06/20/2015  CLINICAL DATA:  82 year old male with left-sided deficits since yesterday. Fall twice since episode. History of vestibular schwannoma resection 1985. Initial encounter. EXAM: MRI HEAD WITHOUT CONTRAST TECHNIQUE: Multiplanar, multiecho pulse sequences of the brain and surrounding structures were obtained without intravenous contrast. COMPARISON:  05/04/2009. FINDINGS: Small acute nonhemorrhagic infarct posterior limb right internal capsule. Question tiny medial left parietal lobe infarct. Marked chronic microvascular changes. Global moderate atrophy without hydrocephalus. No intracranial hemorrhage. Postsurgical changes left mastoid region. No obvious recurrent mass. Major intracranial vascular structures are patent. Narrowing left vertebral artery suspected. Post lens replacement. Paranasal sinus mucosal thickening/opacification most notable right maxillary sinus. Cervical medullary junction within normal limits. IMPRESSION: Small acute nonhemorrhagic infarct posterior limb right internal capsule. Question tiny medial left parietal lobe infarct. Marked chronic microvascular changes. Global moderate atrophy without hydrocephalus. No intracranial hemorrhage. Postsurgical changes left mastoid region. No obvious recurrent mass. Major intracranial vascular structures are patent. Narrowing left vertebral artery suspected. Paranasal sinus  mucosal thickening/opacification most notable right maxillary sinus. Electronically Signed   By: Genia Del M.D.   On: 06/20/2015 12:38   Dg Chest Port 1 View  06/21/2015  CLINICAL DATA:  Right rib and chest wall pain. Fall 2 days ago. Initial encounter. EXAM: PORTABLE CHEST 1 VIEW COMPARISON:  10/27/2014 FINDINGS: Low lung volumes again noted. No evidence of pneumothorax or hemothorax. No evidence of acute infiltrate. Heart size remains stable. Prior CABG noted. IMPRESSION: Low lung volumes.  No acute findings. Electronically Signed   By: Earle Gell M.D.   On: 06/21/2015 13:35   Mr Jodene Nam Head/brain Wo Cm  06/20/2015  CLINICAL DATA:  Stroke.  Acute left-sided weakness. EXAM: MRA HEAD WITHOUT CONTRAST TECHNIQUE: Angiographic images of the Circle of Willis were obtained using MRA technique without intravenous contrast. COMPARISON:  MRI 06/20/2015 FINDINGS: Image quality degraded by motion. This level of motion obscures detail in small vessels. Both vertebral arteries patent to the basilar. PICA patent bilaterally. Basilar is patent. Superior cerebellar arteries patent bilaterally. Fetal origin of the right posterior cerebral artery. Internal carotid artery is patent bilaterally without significant stenosis. Anterior and middle  cerebral arteries are patent bilaterally. No aneurysm identified IMPRESSION: Image quality degraded by mild to moderate motion. No large vessel occlusion. Electronically Signed   By: Franchot Gallo M.D.   On: 06/20/2015 18:10   Labs: Basic Metabolic Panel:  Recent Labs Lab 06/20/15 1032 06/20/15 1058 06/23/15 0525  NA 139 140 139  K 4.3 4.2 4.1  CL 105 101 106  CO2 29  --  26  GLUCOSE 117* 109* 88  BUN 25* 27* 23*  CREATININE 1.50* 1.40* 1.43*  CALCIUM 9.2  --  8.7*   Liver Function Tests:  Recent Labs Lab 06/20/15 1032  AST 46*  ALT 43  ALKPHOS 68  BILITOT 1.5*  PROT 6.4*  ALBUMIN 3.6   CBC:  Recent Labs Lab 06/20/15 1032 06/20/15 1058 06/23/15 0525   WBC 7.2  --  7.8  NEUTROABS 4.8  --   --   HGB 14.9 15.6 14.6  HCT 45.6 46.0 43.9  MCV 105.1*  --  103.5*  PLT 87*  --  80*   CBG:  Recent Labs Lab 06/20/15 1040  GLUCAP 94    Signed:  Barton Dubois MD.  [facility] 06/24/2015, 1:47 PM

## 2015-06-24 NOTE — PMR Pre-admission (Signed)
PMR Admission Coordinator Pre-Admission Assessment  Patient: Andrew [name] is an 82 y.o., male MRN: [id number] DOB: May 04, 1923 Height: 5' 9.5" (176.5 cm) Weight: 88.451 kg (195 lb)              Insurance Information HMO:     PPO: yes     PCP:      IPA:      80/20:      OTHER: medicare advantage plan PRIMARY: [place] [employer]      Policy#: [id number]    Subscriber: pt CM Name: Jordan Hawks      Phone#: [phone]     Fax#: [phone] f/u will be Sherlynn Stalls at [phone] in 7 days. Has EPIC access. Approved after peer to peer and appeals process for approval Pre-Cert#: [id number]      Employer: retired [employer] Benefits:  Phone #: [phone]     Name: 5/30 Eff. Date: 08/22/13     Deduct: $290      Out of Pocket Max: $3290      Life Max: none CIR: after deductible covers 80% unitl inpt oop met of $2251 then covers 100%      SNF: $20 copay per day days 1-20: $160 copay per day days 21-100 Outpatient: 80%     Co-Pay: 20% no visit limit Home Health: 100%      Co-Pay: no visit limit DME: 80%     Co-Pay: 20% Providers: in network  SECONDARY: none       Medicaid Application Date:       Case Manager:  Disability Application Date:       Case Worker:   Emergency Facilities manager Information    Name Relation Home Work Mobile   [place] Spouse [phone]     Oppedisano,Chanteyl Daughter [phone]     Covey,Tara Daughter [phone]       Current Medical History  Patient Admitting Diagnosis: 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 right handed male with history of hypertension, CAD status post CABG maintain on aspirin 325 mg daily, prostate cancer, COPDWith remote tobacco abuse, diastolic congestive heart failure, Left carotid stenosis 60-79% identified January 2017. Presented 06/20/2015 with left-sided weakness. MRI showed small acute nonhemorrhagic infarct posterior limb right  internal capsule. Question tiny medial left parietal lobe infarct. MRA no large vessel occlusion. Patient did not receive TPA. Echocardiogram with ejection fraction of 28% grade 2 diastolic dysfunction and no wall motion abnormalities. Follow-up carotid Dopplers 06/22/2015 again shows 60-79% left ICA stenosis no significant changes compared to January 2017. Neurology follow-up presently maintained on aspirin. Tolerating a regular consistency diet.Physical and Occupational therapy evaluations completed with recommendations of physical medicine rehabilitation consult.  Total: 4 NIH   Past Medical History  Past Medical History  Diagnosis Date  . HYPERCHOLESTEROLEMIA   . CAD, ARTERY BYPASS GRAFT   . CAROTID ARTERY STENOSIS 03/07/2010    80%  . AORTIC STENOSIS   . GASTROESOPHAGEAL REFLUX DISEASE   . Skin cancer   . History of colonic polyps     1999, 2004  . Allergic rhinitis   . Thyroid nodule 05/2010    Abnormal biopsy, 82 year old patient and his wife have made informed decision to not pursue surgical resection. Potential risk including cancer has been thoroughly discussed with the patient.  . S/P excision of acoustic neuroma   . History of prostate cancer 2002    s/p treatment with seeds / radiation  .  Lumbar spinal stenosis 07/13/2009  . COPD, mild ([place]) 12/22/2010  . Pancreatic cyst 01/29/2013    Noted on MRI scan from [facility] on 07/27/2011.  I reviewed report at patient request on 01-29-2013.  "Small cystic focus in the posterior pancreatic head.  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  Follow up MRI is advised. The main pancreatic duct remains normal in appearance."  Patient do  . CHF (congestive heart failure) ([place])   . Macular degeneration (senile) of retina 04/29/2014    Family History  family history includes Colon cancer in his sister and sister; Emphysema in his brother;  Heart attack in his father; Heart disease in his brother and father; Hypertension in his father; Lung cancer in his brother and daughter; Non-Hodgkin's lymphoma in his sister; Skin cancer in his sister.  Prior Rehab/Hospitalizations:  Has the patient had major surgery during 100 days prior to admission? No  Current Medications   Current facility-administered medications:  .  0.9 %  sodium chloride infusion, , Intravenous, Continuous, Rondel Jumbo, PA-C, Last Rate: 50 mL/hr at 06/21/15 1726 .  acetaminophen (TYLENOL) tablet 650 mg, 650 mg, Oral, Q4H PRN, Rhetta Mura Schorr, NP, 650 mg at 06/21/15 0441 .  albuterol (PROVENTIL) (2.5 MG/3ML) 0.083% nebulizer solution 2.5 mg, 2.5 mg, Nebulization, Q6H PRN, Romona Curls, RPH .  aspirin suppository 300 mg, 300 mg, Rectal, Daily **OR** aspirin tablet 325 mg, 325 mg, Oral, Daily, Rondel Jumbo, PA-C, 325 mg at 06/24/15 0919 .  FLUoxetine (PROZAC) capsule 10 mg, 10 mg, Oral, Daily, Rondel Jumbo, PA-C, 10 mg at 06/24/15 6384 .  furosemide (LASIX) tablet 40 mg, 40 mg, Oral, Daily, Rondel Jumbo, PA-C, 40 mg at 06/24/15 6659 .  hydrALAZINE (APRESOLINE) injection 5-10 mg, 5-10 mg, Intravenous, Q8H PRN, Rondel Jumbo, PA-C .  multivitamin (RENA-VIT) tablet 1 tablet, 1 tablet, Oral, Daily, Rondel Jumbo, PA-C, 1 tablet at 06/24/15 0919 .  potassium chloride SA (K-DUR,KLOR-CON) CR tablet 20 mEq, 20 mEq, Oral, Daily, Rondel Jumbo, PA-C, 20 mEq at 06/24/15 0919 .  pravastatin (PRAVACHOL) tablet 40 mg, 40 mg, Oral, Daily, Rondel Jumbo, PA-C, 40 mg at 06/24/15 0919 .  senna-docusate (Senokot-S) tablet 1 tablet, 1 tablet, Oral, QHS PRN, Rondel Jumbo, PA-C .  thiamine (VITAMIN B-1) tablet 100 mg, 100 mg, Oral, Daily, 100 mg at 06/24/15 0919 **OR** [DISCONTINUED] thiamine (B-1) injection 100 mg, 100 mg, Intravenous, Daily, Rondel Jumbo, PA-C .  vitamin B-12 (CYANOCOBALAMIN) tablet 1,000 mcg, 1,000 mcg, Oral, Daily, Barton Dubois, MD, 1,000 mcg at  06/24/15 9357  Patients Current Diet: Diet Heart Room service appropriate?: Yes; Fluid consistency:: Thin  Precautions / Restrictions Precautions Precautions: Fall Precaution Comments: macular degeneration Restrictions Weight Bearing Restrictions: No   Has the patient had 2 or more falls or a fall with injury in the past year?No  Prior Activity Level Community (5-7x/wk): no driving due to maculer degeneration, recumbent bike 30 mins every morning  Home Assistive Devices / [place] Devices/Equipment: Cane (specify quad or straight), Walker (specify type) Home Equipment: Walker - 2 wheels  Prior Device Use: Indicate devices/aids used by the patient prior to current illness, exacerbation or injury? None of the above  Prior Functional Level Prior Function Level of Independence: Independent Comments: HOH with r ear hearing aid, prior balance issues but no falls until day of admission  Self Care: Did  the patient need help bathing, dressing, using the toilet or eating?  Independent  Indoor Mobility: Did the patient need assistance with walking from room to room (with or without device)? Independent  Stairs: Did the patient need assistance with internal or external stairs (with or without device)? Independent  Functional Cognition: Did the patient need help planning regular tasks such as shopping or remembering to take medications? Independent  Current Functional Level Cognition  Overall Cognitive Status: Within Functional Limits for tasks assessed Orientation Level: Oriented X4    Extremity Assessment (includes Sensation/Coordination)  Upper Extremity Assessment: Defer to OT evaluation LUE Deficits / Details: noted ataxi movements during transfers  Lower Extremity Assessment: LLE deficits/detail LLE Deficits / Details: pt with impaired sensation and proprioception wiht L LE demonstrating ataxic mvmt with exagerated step on L LE. increased bilat UE WBing due to  decreased L LE WBing. when pt given visual on the floor to aim when taking step with L LE pt with improved gait pattern LLE Sensation: decreased light touch (20-30%)    ADLs  Overall ADL's : Needs assistance/impaired Eating/Feeding: Set up, Sitting Eating/Feeding Details (indicate cue type and reason): (A) with opening containers due to visual deficits. Pt able to self feed  Grooming: Wash/dry hands, Wash/dry face, Oral care, Sitting, Set up Grooming Details (indicate cue type and reason): used wet wipes to clear hands Upper Body Bathing: Minimal assitance, Sitting Lower Body Bathing: Minimal assistance, Sit to/from stand Upper Body Dressing : Minimal assistance, Sitting Lower Body Dressing: Set up, Sitting/lateral leans Lower Body Dressing Details (indicate cue type and reason): donned socks crossing foot over opposite knee Toilet Transfer: Moderate assistance, RW Toilet Transfer Details (indicate cue type and reason): narrowed base of support. pt has to stop and have (A) to correct posture priro to proceeding with transfer Toileting- Clothing Manipulation and Hygiene: Minimal assistance, Sit to/from stand Toileting - Clothing Manipulation Details (indicate cue type and reason): washed pericarea with set up and assist for standing balance Functional mobility during ADLs: Moderate assistance, Rolling walker General ADL Comments: pt supine on arrival and reports "i need new legs" when starting session. Pt shows awareness to deficits with balance.     Mobility  Overal bed mobility: Needs Assistance Bed Mobility: Supine to Sit, Sit to Supine Rolling: Min guard Supine to sit: Min assist, HOB elevated Sit to supine: Min guard, HOB elevated General bed mobility comments: Assist with elevating trunk to get to EOB. NO assist to return to supine but assist to scoot up in bed.    Transfers  Overall transfer level: Needs assistance Equipment used: None Transfers: Sit to/from Stand Sit to Stand:  Min assist General transfer comment: min assist to steady in standing. Stood from Personnel officer.    Ambulation / Gait / Stairs / Wheelchair Mobility  Ambulation/Gait Ambulation/Gait assistance: Mod assist, +2 safety/equipment Ambulation Distance (Feet): 100 Feet (x2 bouts) Assistive device:  (rail in hallway) Gait Pattern/deviations: Step-through pattern, Step-to pattern, Decreased stride length, Decreased stance time - left, Ataxic General Gait Details: Cues to widen BoS and to decrease step length on left. Use rail for support and hand held assist for other UE support. 2 seated rest breaks. Ataxia improved with increased cadence.  Gait velocity: slow Gait velocity interpretation: Below normal speed for age/gender    Posture / Balance Balance Overall balance assessment: Needs assistance Sitting-balance support: Feet supported, No upper extremity supported Sitting balance-Leahy Scale: Good Standing balance support: During functional activity, Single extremity supported Standing balance-Leahy Scale: Poor  Standing balance comment: Posterior bias. Needs  UE support.    Special needs/care consideration Skin ecchymosis to BUE Bowel mgmt: continent Bladder mgmt: continent maculer degeneration HOH with r hearing aid   Previous Home Environment Living Arrangements: Spouse/significant other  Lives With: Spouse Available Help at Discharge: Available 24 hours/day Type of Home: House Home Layout: One level Home Access: Stairs to enter Entrance Stairs-Rails: Right, Left, Can reach both Entrance Stairs-Number of Steps: 4 Bathroom Shower/Tub: Walk-in shower (with 4 inch stepover, rails in bathroom) Bathroom Toilet: Standard Bathroom Accessibility: Yes How Accessible: Accessible via walker Home Care Services: No Additional Comments: wife drives   Discharge Living Setting Plans for Discharge Living Setting: Patient's home, Lives with (comment) (wife) Type of Home at Discharge:  House Discharge Home Layout: One level Discharge Home Access: Stairs to enter Entrance Stairs-Rails: Right, Left, Can reach both Entrance Stairs-Number of Steps: 4 Discharge Bathroom Shower/Tub: Walk-in shower (4 inch step into , rails in bathroom) Discharge Bathroom Toilet: Standard Discharge Bathroom Accessibility: Yes How Accessible: Accessible via walker Does the patient have any problems obtaining your medications?: No  Social/Family/Support Systems Patient Roles: Spouse, Parent (two daughters) Contact Information: wife and two daughters very involved Anticipated Caregiver: wife Anticipated Ambulance person Information: see above Ability/Limitations of Caregiver: no limitations Caregiver Availability: 24/7 Discharge Plan Discussed with Primary Caregiver: Yes Is Caregiver In Agreement with Plan?: Yes Does Caregiver/Family have Issues with Lodging/Transportation while Pt is in Rehab?: No  Goals/Additional Needs Patient/Family Goal for Rehab: supervision to min assist with PT and OT Expected length of stay: [facility] 15-18 days; pt states home in 7 days. wife says 2 weeks Equipment Needs: hearing aide r ear Pt/Family Agrees to Admission and willing to participate: Yes Program Orientation Provided & Reviewed with Pt/Caregiver Including Roles  & Responsibilities: Yes  Decrease burden of Care through IP rehab admission: n/a  Possible need for SNF placement upon discharge: not anticipated  Patient Condition: This patient's medical and functional status has changed since the consult dated: 06/21/2015 in which the Rehabilitation Physician determined and documented that the patient's condition is appropriate for intensive rehabilitative care in an inpatient rehabilitation facility. See "History of Present Illness" (above) for medical update. Functional changes are: mod assist overall. Patient's medical and functional status update has been discussed with the Rehabilitation physician and  patient remains appropriate for inpatient rehabilitation. Will admit to inpatient rehab today.  Preadmission Screen Completed By:  Cleatrice Burke, 06/24/2015 1:19 PM ______________________________________________________________________   Discussed status with Dr. Naaman Plummer on 06/24/2015 at 28 and received telephone approval for admission today.  Admission Coordinator:  Cleatrice Burke, time 1318 Date 06/24/2015.

## 2015-06-24 NOTE — H&P (View-Only) (Signed)
Physical Medicine and Rehabilitation Admission H&P    Chief Complaint  Patient presents with  . Weakness    L sided  : HPI: Andrew [name] is a 82 y.o. right handed male with history of hypertension, CAD status post CABG maintain on aspirin 325 mg daily, prostate cancer, COPDWith remote tobacco abuse, diastolic congestive heart failure, Left carotid stenosis 60-79% identified January 2017. Per chart review patient lives with wife. Independent prior to admission using a cane. He does not drive. Wife can assist. Daughter and Rondall Allegra. Presented 06/20/2015 with left-sided weakness. MRI showed small acute nonhemorrhagic infarct posterior limb right internal capsule. Question tiny medial left parietal lobe infarct. MRA no large vessel occlusion. Patient did not receive TPA. Echocardiogram with ejection fraction of 27% grade 2 diastolic dysfunction and no wall motion abnormalities. Follow-up carotid Dopplers 06/22/2015 again shows 60-79% left ICA stenosis no significant changes compared to January 2017. Neurology follow-up presently maintained on aspirin. Tolerating a regular consistency diet.Physical and Occupational therapy evaluations completed with recommendations of physical medicine rehabilitation consult.Patient was admitted for a comprehensive rehabilitation program  ROS Constitutional: Negative for fever and chills.  HENT: Positive for hearing loss.  Eyes: complains of some dryness/blurriness in left eye  Respiratory: Negative for cough.   Increased shortness of breath with exertion  Cardiovascular: Positive for leg swelling. Negative for chest pain and palpitations.  Gastrointestinal: Positive for constipation. Negative for nausea and vomiting.   GERD  Genitourinary: Positive for urgency. Negative for dysuria and hematuria.  Musculoskeletal: Positive for back pain.  Skin: Negative for rash.  Neurological: Positive for weakness. Negative for seizures and headaches.    Psychiatric. Depression All other systems reviewed and are negative   Past Medical History  Diagnosis Date  . HYPERCHOLESTEROLEMIA   . CAD, ARTERY BYPASS GRAFT   . CAROTID ARTERY STENOSIS 03/07/2010    80%  . AORTIC STENOSIS   . GASTROESOPHAGEAL REFLUX DISEASE   . Skin cancer   . History of colonic polyps     1999, 2004  . Allergic rhinitis   . Thyroid nodule 05/2010    Abnormal biopsy, 82 year old patient and his wife have made informed decision to not pursue surgical resection. Potential risk including cancer has been thoroughly discussed with the patient.  . S/P excision of acoustic neuroma   . History of prostate cancer 2002    s/p treatment with seeds / radiation  . Lumbar spinal stenosis 07/13/2009  . COPD, mild ([place]) 12/22/2010  . Pancreatic cyst 01/29/2013    Noted on MRI scan from [facility] on 07/27/2011.  I reviewed report at patient request on 01-29-2013.  "Small cystic focus in the posterior pancreatic head.  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  Follow up MRI is advised. The main pancreatic duct remains normal in appearance."  Patient do  . CHF (congestive heart failure) ([place])   . Macular degeneration (senile) of retina 04/29/2014   Past Surgical History  Procedure Laterality Date  . Coronary artery bypass graft    . Transperineal implatation of palladium    . Cholecystectomy    . Shoulder surgery    . Total knee arthroplasty    . Cataract extraction    . Tonsillectomy and adenoidectomy  1932  . Craniectomy for excision of acoustic neuroma  1985  . Cataract extraction  2010  . Eye surgery     Family History  Problem Relation Age of Onset  . Emphysema Brother   . Lung cancer Brother   . Alcohol abuse    . Arthritis    . Cancer    . Macular degeneration    . Heart disease Father   . Hypertension Father   . Heart attack Father   . Colon cancer Sister    . Non-Hodgkin's lymphoma Sister   . Heart disease Brother   . Colon cancer Sister   . Skin cancer Sister   . Lung cancer Daughter    Social History:  reports that he quit smoking about 54 years ago. His smoking use included Cigarettes. He has a 40 pack-year smoking history. He has never used smokeless tobacco. He reports that he drinks about 4.2 oz of alcohol per week. He reports that he does not use illicit drugs. Allergies: No Known Allergies Medications Prior to Admission  Medication Sig Dispense Refill  . acetaminophen (TYLENOL) 500 MG tablet Take 500 mg by mouth every 6 (six) hours as needed (pain).    Marland Kitchen albuterol (PROVENTIL HFA;VENTOLIN HFA) 108 (90 BASE) MCG/ACT inhaler Inhale 2 puffs into the lungs every 6 (six) hours as needed for wheezing or shortness of breath. 1 Inhaler 3  . aspirin 325 MG tablet Take 325 mg by mouth daily.    . B Complex-C-Folic Acid (NEPHRO-VITE PO) Take 1 tablet by mouth daily.      . cholecalciferol (VITAMIN D) 400 units TABS tablet Take 400 Units by mouth daily.    [name] 10 MG tablet Take 1 tablet (10 mg total) by mouth daily. 90 tablet 1  . furosemide (LASIX) 40 MG tablet TAKE 1 TABLET BY MOUTH EVERY MORNING AND 1/2 TABEVERY EVENING (Patient taking differently: TAKE 1 TABLET BY MOUTH EVERY MORNING AND 1/2 TAB EVERY EVENING) 135 tablet 3  . isosorbide mononitrate (IMDUR) 60 MG 24 hr tablet Take 1.5 tablets (90 mg total) by mouth daily. 135 tablet 3  . metoprolol succinate (TOPROL-XL) 25 MG 24 hr tablet Take 1 tablet (25 mg total) by mouth daily. 90 tablet 3  . Multiple Vitamins-Minerals (PRESERVISION/LUTEIN) CAPS Take 1 capsule by mouth 2 (two) times daily.    . nitroGLYCERIN (NITROLINGUAL) 0.4 MG/SPRAY spray PLACE 1 SPRAY UNDER THE TONGUE EVERY 5 (FIVE) MINUTES AS NEEDED. (Patient taking differently: PLACE 1 SPRAY UNDER THE TONGUE EVERY 5 (FIVE) MINUTES AS NEEDED FOR CHEST PAIN) 4.9 g 12  . OVER THE COUNTER MEDICATION Place 1 drop into both  eyes daily as needed (dry eyes). OTC lubricating eye drop    . potassium chloride SA (K-DUR,KLOR-CON) 20 MEQ tablet TAKE 1 TABLET BY MOUTH DAILY. 90 tablet 2  . pravastatin (PRAVACHOL) 40 MG tablet TAKE 1 TABLET DAILY  ( CALL TO SCHEDULE AN 6 MONTHS APPOINTMENT FOR FUTURE REFILLS ) (Patient taking differently: TAKE 1 TABLET BY MOUTH DAILY WITH SUPPER ( CALL TO SCHEDULE AN 6 MONTHS APPOINTMENT FOR FUTURE REFILLS )) 90 tablet 3  . PRESCRIPTION MEDICATION Apply 1 application topically daily as needed (rash). Cream for unknown rash      Home: Home Living Family/patient expects to be discharged to:: Inpatient rehab Living Arrangements: Spouse/significant other Available Help at Discharge: Available 24 hours/day Type of Home: House Home Access: Stairs to enter [employer] of Steps: 4 Entrance Stairs-Rails: Right, Left, Can reach both Home Layout: One level Bathroom Shower/Tub: Walk-in shower (with 4 inch stepover, rails in bathroom) Bathroom Toilet: Standard Bathroom Accessibility: Yes Home Equipment: Walker - 2 wheels Additional Comments:  wife drives   Lives With: Spouse   Functional History: Prior Function Level of Independence: Independent Comments: HOH with r ear hearing aid, prior balance issues but no falls until day of admission  Functional Status:  Mobility: Bed Mobility Overal bed mobility: Needs Assistance Bed Mobility: Supine to Sit, Sit to Supine Rolling: Min guard Supine to sit: Min assist, HOB elevated Sit to supine: Min guard, HOB elevated General bed mobility comments: Assist with elevating trunk to get to EOB. NO assist to return to supine but assist to scoot up in bed. Transfers Overall transfer level: Needs assistance Equipment used: None Transfers: Sit to/from Stand Sit to Stand: Min assist General transfer comment: min assist to steady in standing. Stood from Personnel officer. Ambulation/Gait Ambulation/Gait assistance: Mod assist, +2  safety/equipment Ambulation Distance (Feet): 100 Feet (x2 bouts) Assistive device:  (rail in hallway) Gait Pattern/deviations: Step-through pattern, Step-to pattern, Decreased stride length, Decreased stance time - left, Ataxic General Gait Details: Cues to widen BoS and to decrease step length on left. Use rail for support and hand held assist for other UE support. 2 seated rest breaks. Ataxia improved with increased cadence.  Gait velocity: slow Gait velocity interpretation: Below normal speed for age/gender    ADL: ADL Overall ADL's : Needs assistance/impaired Eating/Feeding: Set up, Sitting Eating/Feeding Details (indicate cue type and reason): (A) with opening containers due to visual deficits. Pt able to self feed  Grooming: Wash/dry hands, Wash/dry face, Oral care, Sitting, Set up Grooming Details (indicate cue type and reason): used wet wipes to clear hands Upper Body Bathing: Minimal assitance, Sitting Lower Body Bathing: Minimal assistance, Sit to/from stand Upper Body Dressing : Minimal assistance, Sitting Lower Body Dressing: Set up, Sitting/lateral leans Lower Body Dressing Details (indicate cue type and reason): donned socks crossing foot over opposite knee Toilet Transfer: Moderate assistance, RW Toilet Transfer Details (indicate cue type and reason): narrowed base of support. pt has to stop and have (A) to correct posture priro to proceeding with transfer Toileting- Clothing Manipulation and Hygiene: Minimal assistance, Sit to/from stand Toileting - Clothing Manipulation Details (indicate cue type and reason): washed pericarea with set up and assist for standing balance Functional mobility during ADLs: Moderate assistance, Rolling walker General ADL Comments: pt supine on arrival and reports "i need new legs" when starting session. Pt shows awareness to deficits with balance.   Cognition: Cognition Overall Cognitive Status: Within Functional Limits for tasks  assessed Orientation Level: Oriented X4 Cognition Arousal/Alertness: Awake/alert Behavior During Therapy: WFL for tasks assessed/performed Overall Cognitive Status: Within Functional Limits for tasks assessed  Physical Exam: Blood pressure 127/52, pulse 70, temperature 98.5 F (36.9 C), temperature source Oral, resp. rate 16, height 5' 9.5" (1.765 m), weight 88.451 kg (195 lb), SpO2 97 %. Physical Exam Constitutional: He is oriented to person, place, and time. He appears well-developed and well-nourished.  HENT: fair dentition, oral mucosa pink and moist Head: Normocephalic and atraumatic.  Eyes: Conjunctivae and EOM are normal. Small polyp on left upper eye lid Neck: Normal range of motion. Neck supple. No thyromegaly present.  Cardiovascular: Normal rate and regular rhythm.  Respiratory: Effort normal and breath sounds normal. No respiratory distress.  GI: Soft. Bowel sounds are normal. He exhibits no distension.  Musculoskeletal: He exhibits no edema or tenderness.  Neurological: He is alert and oriented to person, place, and time.  Patient is a bit hard of hearing.  Follows full commands.  Good awareness of deficits Sensation intact to light touch in all  4's DTRs symmetric Left facial weakness (baseline) Motor: B/l UE 4-5+/5 delt,bic,tric,wrist,hand RLE: 4+-5/5 proximal to distal  LLE: Hip flexion 3/5, knee extension 4/5, ankle dorsi/plantar flexion 4+/5  Skin: Skin is warm and dry.  Psychiatric: He has a normal mood and affect. His behavior is normal   Results for orders placed or performed during the hospital encounter of 06/20/15 (from the past 48 hour(s))  Hemoglobin A1c     Status: None   Collection Time: 06/23/15  5:25 AM  Result Value Ref Range   Hgb A1c MFr Bld 5.4 4.8 - 5.6 %    Comment: (NOTE)         Pre-diabetes: 5.7 - 6.4         Diabetes: >6.4         Glycemic control for adults with diabetes: <7.0    Mean Plasma Glucose 108 mg/dL    Comment:  (NOTE) Performed At: [facility] [place], [place] [id number] Lindon Romp MD [id number]   CBC     Status: Abnormal   Collection Time: 06/23/15  5:25 AM  Result Value Ref Range   WBC 7.8 4.0 - 10.5 K/uL   RBC 4.24 4.22 - 5.81 MIL/uL   Hemoglobin 14.6 13.0 - 17.0 g/dL   HCT 43.9 39.0 - 52.0 %   MCV 103.5 (H) 78.0 - 100.0 fL   MCH 34.4 (H) 26.0 - 34.0 pg   MCHC 33.3 30.0 - 36.0 g/dL   RDW 13.4 11.5 - 15.5 %   Platelets 80 (L) 150 - 400 K/uL    Comment: CONSISTENT WITH PREVIOUS RESULT  Basic metabolic panel     Status: Abnormal   Collection Time: 06/23/15  5:25 AM  Result Value Ref Range   Sodium 139 135 - 145 mmol/L   Potassium 4.1 3.5 - 5.1 mmol/L   Chloride 106 101 - 111 mmol/L   CO2 26 22 - 32 mmol/L   Glucose, Bld 88 65 - 99 mg/dL   BUN 23 (H) 6 - 20 mg/dL   Creatinine, Ser 1.43 (H) 0.61 - 1.24 mg/dL   Calcium 8.7 (L) 8.9 - 10.3 mg/dL   GFR calc non Af Amer 42 (L) >60 mL/min   GFR calc Af Amer 48 (L) >60 mL/min    Comment: (NOTE) The eGFR has been calculated using the CKD EPI equation. This calculation has not been validated in all clinical situations. eGFR's persistently <60 mL/min signify possible Chronic Kidney Disease.    Anion gap 7 5 - 15   No results found.     Medical Problem List and Plan: 1.  Left hemiparesis  secondary to nonhemorrhagic infarct posterior limb right internal capsule  -admit to inpatient rehab 2.  DVT Prophylaxis/Anticoagulation: SCDs. Monitor for any signs of DVT 3. Pain Management: Tylenol as needed 4. Mood: Prozac 10 mg daily 5. Neuropsych: This patient is capable of making decisions on his own behalf. 6. Skin/Wound Care: Routine skin checks 7. Fluids/Electrolytes/Nutrition: Routine I&O with follow-up chemistries 8. Diastolic congestive heart failure. Lasix 40 mg daily. No signs of fluid overload. Weigh patient daily 9. Hypertension. Allow permissive hypertension. Patient on indoor 90 mg daily, Toprol  25 mg daily prior to admission. Resume as needed 10. CAD status post CABG. Continue aspirin 11. Hyperlipidemia. Pravachol 12. History of alcohol use. Patient consumes approximately 4.2 ounces of alcohol per week. Monitor and provide counseling  Post Admission Physician Evaluation: 1. Functional deficits secondary  to right PLIC infarct. 2. Patient is  admitted to receive collaborative, interdisciplinary care between the physiatrist, rehab nursing staff, and therapy team. 3. Patient's level of medical complexity and substantial therapy needs in context of that medical necessity cannot be provided at a lesser intensity of care such as a SNF. 4. Patient has experienced substantial functional loss from his/her baseline which was documented above under the "Functional History" and "Functional Status" headings.  Judging by the patient's diagnosis, physical exam, and functional history, the patient has potential for functional progress which will result in measurable gains while on inpatient rehab.  These gains will be of substantial and practical use upon discharge  in facilitating mobility and self-care at the household level. 5. Physiatrist will provide 24 hour management of medical needs as well as oversight of the therapy plan/treatment and provide guidance as appropriate regarding the interaction of the two. 6. 24 hour rehab nursing will assist with bladder management, bowel management, safety, skin/wound care, disease management, medication administration, pain management and patient education  and help integrate therapy concepts, techniques,education, etc. 7. PT will assess and treat for/with: Lower extremity strength, range of motion, stamina, balance, functional mobility, safety, adaptive techniques and equipment, NMR, stroke education, family ed, ego support.   Goals are: mod I. 8. OT will assess and treat for/with: ADL's, functional mobility, safety, upper extremity strength, adaptive techniques and  equipment, NMR, visual-spatial awareness, stroke ed, family e.   Goals are: mod I. Therapy may proceed with showering this patient. 9. SLP will assess and treat for/with: n/a.  Goals are: n/a. 10. Case Management and Social Worker will assess and treat for psychological issues and discharge planning. 11. Team conference will be held weekly to assess progress toward goals and to determine barriers to discharge. 12. Patient will receive at least 3 hours of therapy per day at least 5 days per week. 13. ELOS: 7-9 days       14. Prognosis:  excellent     Meredith Staggers, MD, [place] [facility] 06/24/2015   06/24/2015

## 2015-06-24 NOTE — Progress Notes (Signed)
Physical Therapy Treatment Patient Details Name: Andrew [name] MRN: [id number] DOB: 1923/12/19 Today's Date: 06/24/2015    History of Present Illness 82 yo male with L side weakness. pt s/p fall x2 MRI (+) Small acute nonhemorrhagic infarct posterior limb R internal capsule PMH:HLD, CAD, Prostate, COPD, CHF, TIA 2015, macular degeneration( senile) of retina, shoulder surg, CABG, craniectomy for excision of acoustic neuroma     PT Comments    Patient continues to progress toward PT goals. Grossly mod for ambulation with HHA and rail in hallway. Motivated for participation in therapy. Current plan remains appropriate.   Follow Up Recommendations  CIR     Equipment Recommendations  None recommended by PT    Recommendations for Other Services Rehab consult     Precautions / Restrictions Precautions Precautions: Fall Precaution Comments: macular degeneration Restrictions Weight Bearing Restrictions: No    Mobility  Bed Mobility Overal bed mobility: Needs Assistance Bed Mobility: Supine to Sit;Sit to Supine     Supine to sit: Min assist;HOB elevated Sit to supine: Min guard   General bed mobility comments: assist to elevate trunk into sitting; cues for sequencing and technique; increased time   Transfers Overall transfer level: Needs assistance Equipment used: None Transfers: Sit to/from Stand Sit to Stand: Min assist         General transfer comment: assist to power up into standing and gain balance upon stand from EOB X1 and recliner X2  Ambulation/Gait Ambulation/Gait assistance: Mod assist;+2 safety/equipment;Max assist Ambulation Distance (Feet): 100 Feet (40,60) Assistive device: 1 person hand held assist (rail in hallway) Gait Pattern/deviations: Step-to pattern;Decreased stride length;Decreased step length - right;Decreased stance time - right;Ataxic;Narrow base of support     General Gait Details: cues for step length, posture, and wider BOS; pt required  multimodal cues for trunk extension and sequencing of gait; chair follow close behind; pt with multiple LOB requiring assist to regain balance   Stairs            Wheelchair Mobility    Modified Rankin (Stroke Patients Only) Modified Rankin (Stroke Patients Only) Pre-Morbid Rankin Score: No significant disability Modified Rankin: Moderately severe disability     Balance Overall balance assessment: Needs assistance Sitting-balance support: No upper extremity supported;Feet supported Sitting balance-Leahy Scale: Good     Standing balance support: During functional activity Standing balance-Leahy Scale: Poor                      Cognition Arousal/Alertness: Awake/alert Behavior During Therapy: WFL for tasks assessed/performed Overall Cognitive Status: Within Functional Limits for tasks assessed                      Exercises      General Comments General comments (skin integrity, edema, etc.): spouse present for session       Pertinent Vitals/Pain Pain Assessment: No/denies pain    Home Living         Home Access: Stairs to enter Entrance Stairs-Rails: Right;Left;Can reach both Home Layout: One level        Prior Function        Comments: HOH with r ear hearing aid, prior balance issues but no falls until day of admission   PT Goals (current goals can now be found in the care plan section) Acute Rehab PT Goals Patient Stated Goal: go to rehab Progress towards PT goals: Progressing toward goals    Frequency  Min 4X/week    PT Plan Current  plan remains appropriate    Co-evaluation             End of Session Equipment Utilized During Treatment: Gait belt Activity Tolerance: Patient tolerated treatment well Patient left: in bed;with call bell/phone within reach;with bed alarm set;with family/visitor present     Time: [id number] PT Time Calculation (min) (ACUTE ONLY): 24 min  Charges:  $Gait Training: 23-37 mins                     G Codes:      Salina April, PTA Pager: [phone]   06/24/2015, 4:00 PM

## 2015-06-24 NOTE — Progress Notes (Signed)
Andrew Gong, RN Rehab Admission Coordinator Signed Physical Medicine and Rehabilitation PMR Pre-admission 06/24/2015 1:08 PM  Related encounter: ED to Hosp-Admission (Current) from 06/20/2015 in [place] Collapse All   PMR Admission Coordinator Pre-Admission Assessment  Patient: Andrew [name] is an 82 y.o., male MRN: [id number] DOB: January 27, 1923 Height: 5' 9.5" (176.5 cm) Weight: 88.451 kg (195 lb)  Insurance Information HMO: PPO: yes PCP: IPA: 80/20: OTHER: medicare advantage plan PRIMARY: [place] Medicare Policy#: [id number] Subscriber: pt CM Name: Andrew [name] Phone#: [phone] Fax#: [phone] f/u will be Sherlynn Stalls at [phone] in 7 days. Has EPIC access. Approved after peer to peer and appeals process for approval Pre-Cert#: [id number] Employer: retired [employer] trader Benefits: Phone #: [phone] Name: 5/30 Eff. Date: 08/22/13 Deduct: $290 Out of Pocket Max: $3290 Life Max: none CIR: after deductible covers 80% unitl inpt oop met of $2251 then covers 100% SNF: $20 copay per day days 1-20: $160 copay per day days 21-100 Outpatient: 80% Co-Pay: 20% no visit limit Home Health: 100% Co-Pay: no visit limit DME: 80% Co-Pay: 20% Providers: in network  SECONDARY: none  Medicaid Application Date: Case Manager:  Disability Application Date: Case Worker:   Emergency Facilities manager Information    Name Relation Home Work Mobile   Andrew [name] Spouse [phone]     Andrew [name],Andrew [name] Daughter [phone]     Andrew [name],Andrew [name] Daughter [phone]       Current Medical History  Patient Admitting Diagnosis: 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 right handed male with history of hypertension, CAD status post CABG maintain on aspirin 325 mg daily, prostate cancer, COPDWith remote tobacco abuse, diastolic congestive heart failure, Left carotid stenosis 60-79% identified January 2017. Presented 06/20/2015 with left-sided weakness. MRI showed small acute nonhemorrhagic infarct posterior limb right internal capsule. Question tiny medial left parietal lobe infarct. MRA no large vessel occlusion. Patient did not receive TPA. Echocardiogram with ejection fraction of 72% grade 2 diastolic dysfunction and no wall motion abnormalities. Follow-up carotid Dopplers 06/22/2015 again shows 60-79% left ICA stenosis no significant changes compared to January 2017. Neurology follow-up presently maintained on aspirin. Tolerating a regular consistency diet.Physical and Occupational therapy evaluations completed with recommendations of physical medicine rehabilitation consult.  Total: 4 NIH   Past Medical History  Past Medical History  Diagnosis Date  . HYPERCHOLESTEROLEMIA   . CAD, ARTERY BYPASS GRAFT   . CAROTID ARTERY STENOSIS 03/07/2010    80%  . AORTIC STENOSIS   . GASTROESOPHAGEAL REFLUX DISEASE   . Skin cancer   . History of colonic polyps     1999, 2004  . Allergic rhinitis   . Thyroid nodule 05/2010    Abnormal biopsy, 82 year old patient and his wife have made informed decision to not pursue surgical resection. Potential risk including cancer has been thoroughly discussed with the patient.  . S/P excision of acoustic neuroma   . History of prostate cancer 2002    s/p treatment with seeds / radiation  . Lumbar spinal stenosis 07/13/2009  . COPD, mild ([place]) 12/22/2010  . Pancreatic cyst 01/29/2013    Noted on MRI scan from [facility] on 07/27/2011. I reviewed report at patient request on  01-29-2013. "Small cystic focus in the posterior pancreatic head. 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 Follow  up MRI is advised. The main pancreatic duct remains normal in appearance." Patient do  . CHF (congestive heart failure) ([place])   . Macular degeneration (senile) of retina 04/29/2014    Family History  family history includes Colon cancer in his sister and sister; Emphysema in his brother; Heart attack in his father; Heart disease in his brother and father; Hypertension in his father; Lung cancer in his brother and daughter; Non-Hodgkin's lymphoma in his sister; Skin cancer in his sister.  Prior Rehab/Hospitalizations:  Has the patient had major surgery during 100 days prior to admission? No  Current Medications   Current facility-administered medications:  . 0.9 % sodium chloride infusion, , Intravenous, Continuous, Rondel Jumbo, PA-C, Last Rate: 50 mL/hr at 06/21/15 1726 . acetaminophen (TYLENOL) tablet 650 mg, 650 mg, Oral, Q4H PRN, Rhetta Mura Schorr, NP, 650 mg at 06/21/15 0441 . albuterol (PROVENTIL) (2.5 MG/3ML) 0.083% nebulizer solution 2.5 mg, 2.5 mg, Nebulization, Q6H PRN, Romona Curls, RPH . aspirin suppository 300 mg, 300 mg, Rectal, Daily **OR** aspirin tablet 325 mg, 325 mg, Oral, Daily, Rondel Jumbo, PA-C, 325 mg at 06/24/15 0919 . FLUoxetine (PROZAC) capsule 10 mg, 10 mg, Oral, Daily, Rondel Jumbo, PA-C, 10 mg at 06/24/15 1610 . furosemide (LASIX) tablet 40 mg, 40 mg, Oral, Daily, Rondel Jumbo, PA-C, 40 mg at 06/24/15 9604 . hydrALAZINE (APRESOLINE) injection 5-10 mg, 5-10 mg, Intravenous, Q8H PRN, Rondel Jumbo, PA-C . multivitamin (RENA-VIT) tablet 1 tablet, 1 tablet, Oral, Daily, Rondel Jumbo, PA-C, 1 tablet at 06/24/15 0919 . potassium chloride SA (K-DUR,KLOR-CON) CR tablet 20 mEq, 20 mEq, Oral, Daily, Rondel Jumbo, PA-C, 20 mEq at 06/24/15  0919 . pravastatin (PRAVACHOL) tablet 40 mg, 40 mg, Oral, Daily, Rondel Jumbo, PA-C, 40 mg at 06/24/15 0919 . senna-docusate (Senokot-S) tablet 1 tablet, 1 tablet, Oral, QHS PRN, Rondel Jumbo, PA-C . thiamine (VITAMIN B-1) tablet 100 mg, 100 mg, Oral, Daily, 100 mg at 06/24/15 0919 **OR** [DISCONTINUED] thiamine (B-1) injection 100 mg, 100 mg, Intravenous, Daily, Rondel Jumbo, PA-C . vitamin B-12 (CYANOCOBALAMIN) tablet 1,000 mcg, 1,000 mcg, Oral, Daily, Barton Dubois, MD, 1,000 mcg at 06/24/15 5409  Patients Current Diet: Diet Heart Room service appropriate?: Yes; Fluid consistency:: Thin  Precautions / Restrictions Precautions Precautions: Fall Precaution Comments: macular degeneration Restrictions Weight Bearing Restrictions: No   Has the patient had 2 or more falls or a fall with injury in the past year?No  Prior Activity Level Community (5-7x/wk): no driving due to maculer degeneration, recumbent bike 30 mins every morning  Home Assistive Devices / [place] Devices/Equipment: Cane (specify quad or straight), Walker (specify type) Home Equipment: Walker - 2 wheels  Prior Device Use: Indicate devices/aids used by the patient prior to current illness, exacerbation or injury? None of the above  Prior Functional Level Prior Function Level of Independence: Independent Comments: HOH with r ear hearing aid, prior balance issues but no falls until day of admission  Self Care: Did the patient need help bathing, dressing, using the toilet or eating? Independent  Indoor Mobility: Did the patient need assistance with walking from room to room (with or without device)? Independent  Stairs: Did the patient need assistance with internal or external stairs (with or without device)? Independent  Functional Cognition: Did the patient need help planning regular tasks such as shopping or remembering to take medications? Independent  Current Functional  Level Cognition  Overall Cognitive Status: Within Functional Limits for tasks assessed Orientation  Level: Oriented X4   Extremity Assessment (includes Sensation/Coordination)  Upper Extremity Assessment: Defer to OT evaluation LUE Deficits / Details: noted ataxi movements during transfers  Lower Extremity Assessment: LLE deficits/detail LLE Deficits / Details: pt with impaired sensation and proprioception wiht L LE demonstrating ataxic mvmt with exagerated step on L LE. increased bilat UE WBing due to decreased L LE WBing. when pt given visual on the floor to aim when taking step with L LE pt with improved gait pattern LLE Sensation: decreased light touch (20-30%)    ADLs  Overall ADL's : Needs assistance/impaired Eating/Feeding: Set up, Sitting Eating/Feeding Details (indicate cue type and reason): (A) with opening containers due to visual deficits. Pt able to self feed  Grooming: Wash/dry hands, Wash/dry face, Oral care, Sitting, Set up Grooming Details (indicate cue type and reason): used wet wipes to clear hands Upper Body Bathing: Minimal assitance, Sitting Lower Body Bathing: Minimal assistance, Sit to/from stand Upper Body Dressing : Minimal assistance, Sitting Lower Body Dressing: Set up, Sitting/lateral leans Lower Body Dressing Details (indicate cue type and reason): donned socks crossing foot over opposite knee Toilet Transfer: Moderate assistance, RW Toilet Transfer Details (indicate cue type and reason): narrowed base of support. pt has to stop and have (A) to correct posture priro to proceeding with transfer Toileting- Clothing Manipulation and Hygiene: Minimal assistance, Sit to/from stand Toileting - Clothing Manipulation Details (indicate cue type and reason): washed pericarea with set up and assist for standing balance Functional mobility during ADLs: Moderate assistance, Rolling walker General ADL Comments: pt supine on arrival and reports "i need new legs"  when starting session. Pt shows awareness to deficits with balance.     Mobility  Overal bed mobility: Needs Assistance Bed Mobility: Supine to Sit, Sit to Supine Rolling: Min guard Supine to sit: Min assist, HOB elevated Sit to supine: Min guard, HOB elevated General bed mobility comments: Assist with elevating trunk to get to EOB. NO assist to return to supine but assist to scoot up in bed.    Transfers  Overall transfer level: Needs assistance Equipment used: None Transfers: Sit to/from Stand Sit to Stand: Min assist General transfer comment: min assist to steady in standing. Stood from Personnel officer.    Ambulation / Gait / Stairs / Wheelchair Mobility  Ambulation/Gait Ambulation/Gait assistance: Mod assist, +2 safety/equipment Ambulation Distance (Feet): 100 Feet (x2 bouts) Assistive device: (rail in hallway) Gait Pattern/deviations: Step-through pattern, Step-to pattern, Decreased stride length, Decreased stance time - left, Ataxic General Gait Details: Cues to widen BoS and to decrease step length on left. Use rail for support and hand held assist for other UE support. 2 seated rest breaks. Ataxia improved with increased cadence.  Gait velocity: slow Gait velocity interpretation: Below normal speed for age/gender    Posture / Balance Balance Overall balance assessment: Needs assistance Sitting-balance support: Feet supported, No upper extremity supported Sitting balance-Leahy Scale: Good Standing balance support: During functional activity, Single extremity supported Standing balance-Leahy Scale: Poor Standing balance comment: Posterior bias. Needs UE support.    Special needs/care consideration Skin ecchymosis to BUE Bowel mgmt: continent Bladder mgmt: continent maculer degeneration HOH with r hearing aid   Previous Home Environment Living Arrangements: Spouse/significant other Lives With: Spouse Available Help at Discharge: Available 24  hours/day Type of Home: House Home Layout: One level Home Access: Stairs to enter Entrance Stairs-Rails: Right, Left, Can reach both Entrance Stairs-Number of Steps: 4 Bathroom Shower/Tub: Walk-in shower (with 4 inch stepover, rails in bathroom) Bathroom  Toilet: Standard Bathroom Accessibility: Yes How Accessible: Accessible via walker Home Care Services: No Additional Comments: wife drives   Discharge Living Setting Plans for Discharge Living Setting: Patient's home, Lives with (comment) (wife) Type of Home at Discharge: House Discharge Home Layout: One level Discharge Home Access: Stairs to enter Entrance Stairs-Rails: Right, Left, Can reach both Entrance Stairs-Number of Steps: 4 Discharge Bathroom Shower/Tub: Walk-in shower (4 inch step into , rails in bathroom) Discharge Bathroom Toilet: Standard Discharge Bathroom Accessibility: Yes How Accessible: Accessible via walker Does the patient have any problems obtaining your medications?: No  Social/Family/Support Systems Patient Roles: Spouse, Parent (two daughters) Contact Information: wife and two daughters very involved Anticipated Caregiver: wife Anticipated Ambulance person Information: see above Ability/Limitations of Caregiver: no limitations Caregiver Availability: 24/7 Discharge Plan Discussed with Primary Caregiver: Yes Is Caregiver In Agreement with Plan?: Yes Does Caregiver/Family have Issues with Lodging/Transportation while Pt is in Rehab?: No  Goals/Additional Needs Patient/Family Goal for Rehab: supervision to min assist with PT and OT Expected length of stay: [facility] 15-18 days; pt states home in 7 days. wife says 2 weeks Equipment Needs: hearing aide r ear Pt/Family Agrees to Admission and willing to participate: Yes Program Orientation Provided & Reviewed with Pt/Caregiver Including Roles & Responsibilities: Yes  Decrease burden of Care through IP rehab admission: n/a  Possible need for SNF placement  upon discharge: not anticipated  Patient Condition: This patient's medical and functional status has changed since the consult dated: 06/21/2015 in which the Rehabilitation Physician determined and documented that the patient's condition is appropriate for intensive rehabilitative care in an inpatient rehabilitation facility. See "History of Present Illness" (above) for medical update. Functional changes are: mod assist overall. Patient's medical and functional status update has been discussed with the Rehabilitation physician and patient remains appropriate for inpatient rehabilitation. Will admit to inpatient rehab today.  Preadmission Screen Completed By: Cleatrice Burke, 06/24/2015 1:19 PM ______________________________________________________________________  Discussed status with Dr. Naaman Plummer on 06/24/2015 at 55 and received telephone approval for admission today.  Admission Coordinator: Cleatrice Burke, time 1318 Date 06/24/2015.          Cosigned by: Meredith Staggers, MD at 06/24/2015 1:32 PM  Revision History     Date/Time User Provider Type Action   06/24/2015 1:32 PM Meredith Staggers, MD Physician Cosign   06/24/2015 1:19 PM Andrew Gong, RN Rehab Admission Coordinator Sign

## 2015-06-24 NOTE — Care Management Note (Signed)
Case Management Note  Patient Details  Name: Andrew [name] MRN: [id number] Date of Birth: 1923/04/18  Subjective/Objective:                    Action/Plan: Pt discharging to CIR today. No further needs per CM.   Expected Discharge Date:                  Expected Discharge Plan:  [place]  In-House Referral:     Discharge planning Services  CM Consult  Post Acute Care Choice:    Choice offered to:     DME Arranged:    DME Agency:     HH Arranged:    [place] Agency:     Status of Service:  Completed, signed off  Medicare Important Message Given:  Yes Date Medicare IM Given:    Medicare IM give by:    Date Additional Medicare IM Given:    Additional Medicare Important Message give by:     If discussed at [place] of Stay Meetings, dates discussed:    Additional Comments:  Pollie Friar, RN 06/24/2015, 1:59 PM

## 2015-06-24 NOTE — Interval H&P Note (Signed)
Andrew [name] was admitted today to Inpatient Rehabilitation with the diagnosis of right CVA.  The patient's history has been reviewed, patient examined, and there is no change in status.  Patient continues to be appropriate for intensive inpatient rehabilitation.  I have reviewed the patient's chart and labs.  Questions were answered to the patient's satisfaction. The PAPE has been reviewed and assessment remains appropriate.  SWARTZ,ZACHARY T 06/24/2015, 6:48 PM

## 2015-06-24 NOTE — Progress Notes (Signed)
I have received approval from [employer] to admit to inpt rehab on appeal. I have notified Dr. Dyann Kief, RN CM and SW. Pt and his wife are in agreement to admit to inpt rehab today. I will make the arrangements. [id number]

## 2015-06-24 NOTE — Progress Notes (Signed)
Ankit Lorie Phenix, MD Physician Signed Physical Medicine and Rehabilitation Consult Note 06/21/2015 11:02 AM  Related encounter: ED to Hosp-Admission (Current) from 06/20/2015 in [place] Collapse All        Physical Medicine and Rehabilitation Consult Reason for Consult: Nonhemorrhagic infarct posterior limb right internal capsule Referring Physician: [facility]   HPI: Andrew [name] is a 82 y.o. right handed male with history of hypertension, CAD status post CABG maintain on aspirin 325 mg daily, prostate cancer, COPD, diastolic congestive heart failure. Per chart review patient lives with wife. Independent prior to admission using a cane. He does not drive. Wife can assist. Daughter and Rondall Allegra. Presented 06/20/2015 with left-sided weakness. MRI showed small acute nonhemorrhagic infarct posterior limb right internal capsule. Question tiny medial left parietal lobe infarct. MRA no large vessel occlusion. Patient did not receive TPA. Carotid Dopplers and echocardiogram pending. Neurology follow-up presently maintained on aspirin. Tolerating a regular consistency diet. Occupational therapy evaluation completed with recommendations of physical medicine rehabilitation consult.   Review of Systems  Constitutional: Negative for fever and chills.  HENT: Positive for hearing loss.  Eyes: Negative for blurred vision and double vision.  Respiratory: Negative for cough.   Increased shortness of breath with exertion  Cardiovascular: Positive for leg swelling. Negative for chest pain and palpitations.  Gastrointestinal: Positive for constipation. Negative for nausea and vomiting.   GERD  Genitourinary: Positive for urgency. Negative for dysuria and hematuria.  Musculoskeletal: Positive for back pain.  Skin: Negative for rash.  Neurological: Positive for weakness. Negative for seizures and headaches.  All other systems reviewed and are  negative.  Past Medical History  Diagnosis Date  . HYPERCHOLESTEROLEMIA   . CAD, ARTERY BYPASS GRAFT   . CAROTID ARTERY STENOSIS 03/07/2010    80%  . AORTIC STENOSIS   . GASTROESOPHAGEAL REFLUX DISEASE   . Skin cancer   . History of colonic polyps     1999, 2004  . Allergic rhinitis   . Thyroid nodule 05/2010    Abnormal biopsy, 82 year old patient and his wife have made informed decision to not pursue surgical resection. Potential risk including cancer has been thoroughly discussed with the patient.  . S/P excision of acoustic neuroma   . History of prostate cancer 2002    s/p treatment with seeds / radiation  . Lumbar spinal stenosis 07/13/2009  . COPD, mild ([place]) 12/22/2010  . Pancreatic cyst 01/29/2013    Noted on MRI scan from [facility] on 07/27/2011. I reviewed report at patient request on 01-29-2013. "Small cystic focus in the posterior pancreatic head. 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 Follow up MRI is advised. The main pancreatic duct remains normal in appearance." Patient do  . CHF (congestive heart failure) ([place])   . Macular degeneration (senile) of retina 04/29/2014   Past Surgical History  Procedure Laterality Date  . Coronary artery bypass graft    . Transperineal implatation of palladium    . Cholecystectomy    . Shoulder surgery    . Total knee arthroplasty    . Cataract extraction    . Tonsillectomy and adenoidectomy  1932  . Craniectomy for excision of acoustic neuroma  1985  . Cataract extraction  2010  . Eye surgery     Family History  Problem Relation Age of Onset  . Emphysema Brother   .  Lung cancer Brother   . Alcohol abuse    . Arthritis    . Cancer    . Macular degeneration    . Heart disease  Father   . Hypertension Father   . Heart attack Father   . Colon cancer Sister   . Non-Hodgkin's lymphoma Sister   . Heart disease Brother   . Colon cancer Sister   . Skin cancer Sister   . Lung cancer Daughter    Social History:  reports that he quit smoking about 54 years ago. His smoking use included Cigarettes. He has a 40 pack-year smoking history. He has never used smokeless tobacco. He reports that he drinks about 4.2 oz of alcohol per week. He reports that he does not use illicit drugs. Allergies: No Known Allergies Medications Prior to Admission  Medication Sig Dispense Refill  . acetaminophen (TYLENOL) 500 MG tablet Take 500 mg by mouth every 6 (six) hours as needed (pain).    Marland Kitchen albuterol (PROVENTIL HFA;VENTOLIN HFA) 108 (90 BASE) MCG/ACT inhaler Inhale 2 puffs into the lungs every 6 (six) hours as needed for wheezing or shortness of breath. 1 Inhaler 3  . aspirin 325 MG tablet Take 325 mg by mouth daily.    . B Complex-C-Folic Acid (NEPHRO-VITE PO) Take 1 tablet by mouth daily.     . cholecalciferol (VITAMIN D) 400 units TABS tablet Take 400 Units by mouth daily.    [name] 10 MG tablet Take 1 tablet (10 mg total) by mouth daily. 90 tablet 1  . furosemide (LASIX) 40 MG tablet TAKE 1 TABLET BY MOUTH EVERY MORNING AND 1/2 TABEVERY EVENING (Patient taking differently: TAKE 1 TABLET BY MOUTH EVERY MORNING AND 1/2 TAB EVERY EVENING) 135 tablet 3  . isosorbide mononitrate (IMDUR) 60 MG 24 hr tablet Take 1.5 tablets (90 mg total) by mouth daily. 135 tablet 3  . metoprolol succinate (TOPROL-XL) 25 MG 24 hr tablet Take 1 tablet (25 mg total) by mouth daily. 90 tablet 3  . Multiple Vitamins-Minerals (PRESERVISION/LUTEIN) CAPS Take 1 capsule by mouth 2 (two) times daily.    . nitroGLYCERIN (NITROLINGUAL) 0.4 MG/SPRAY spray PLACE 1 SPRAY UNDER THE TONGUE EVERY 5 (FIVE) MINUTES AS NEEDED.  (Patient taking differently: PLACE 1 SPRAY UNDER THE TONGUE EVERY 5 (FIVE) MINUTES AS NEEDED FOR CHEST PAIN) 4.9 g 12  . OVER THE COUNTER MEDICATION Place 1 drop into both eyes daily as needed (dry eyes). OTC lubricating eye drop    . potassium chloride SA (K-DUR,KLOR-CON) 20 MEQ tablet TAKE 1 TABLET BY MOUTH DAILY. 90 tablet 2  . pravastatin (PRAVACHOL) 40 MG tablet TAKE 1 TABLET DAILY ( CALL TO SCHEDULE AN 6 MONTHS APPOINTMENT FOR FUTURE REFILLS ) (Patient taking differently: TAKE 1 TABLET BY MOUTH DAILY WITH SUPPER ( CALL TO SCHEDULE AN 6 MONTHS APPOINTMENT FOR FUTURE REFILLS )) 90 tablet 3  . PRESCRIPTION MEDICATION Apply 1 application topically daily as needed (rash). Cream for unknown rash      Home: Home Living Family/patient expects to be discharged to:: Private residence Living Arrangements: Spouse/significant other Available Help at Discharge: Available 24 hours/day, Family Type of Home: House Home Equipment: Environmental consultant - 2 wheels Additional Comments: wife drives   Functional History: Prior Function Level of Independence: Independent Functional Status:  Mobility: Bed Mobility Overal bed mobility: Needs Assistance Bed Mobility: Rolling, Supine to Sit Rolling: Min guard Supine to sit: Mod assist General bed mobility comments: Pt exiting on L side and needs (A) to elevate  trunk off surface Transfers Overall transfer level: Needs assistance Equipment used: Rolling walker (2 wheeled) Transfers: Sit to/from Stand Sit to Stand: Min assist General transfer comment: pt pulling up on RW. pt needs cues for hand placement for safety      ADL: ADL Overall ADL's : Needs assistance/impaired Eating/Feeding: Set up, Sitting Eating/Feeding Details (indicate cue type and reason): (A) with opening containers due to visual deficits. Pt able to self feed  Grooming: Wash/dry hands, Set up, Sitting Grooming Details (indicate cue type and reason): used wet wipes to  clear hands Upper Body Dressing : Minimal assistance, Sitting Toilet Transfer: Moderate assistance, RW Toilet Transfer Details (indicate cue type and reason): narrowed base of support. pt has to stop and have (A) to correct posture priro to proceeding with transfer Functional mobility during ADLs: Moderate assistance, Rolling walker General ADL Comments: pt supine on arrival and reports "i need new legs" when starting session. Pt shows awareness to deficits with balance.   Cognition: Cognition Overall Cognitive Status: Within Functional Limits for tasks assessed Orientation Level: Oriented X4 Cognition Arousal/Alertness: Awake/alert Behavior During Therapy: WFL for tasks assessed/performed Overall Cognitive Status: Within Functional Limits for tasks assessed  Blood pressure 129/45, pulse 71, temperature 97.4 F (36.3 C), temperature source Oral, resp. rate 16, height 5' 9.5" (1.765 m), weight 88.451 kg (195 lb), SpO2 94 %. Physical Exam  Vitals reviewed. Constitutional: He is oriented to person, place, and time. He appears well-developed and well-nourished.  HENT:  Head: Normocephalic and atraumatic.  Eyes: Conjunctivae and EOM are normal.  Neck: Normal range of motion. Neck supple. No thyromegaly present.  Cardiovascular: Normal rate and regular rhythm.  Respiratory: Effort normal and breath sounds normal. No respiratory distress.  GI: Soft. Bowel sounds are normal. He exhibits no distension.  Musculoskeletal: He exhibits no edema or tenderness.  Neurological: He is alert and oriented to person, place, and time.  Patient is a bit hard of hearing.  Follows full commands.  Good awareness of deficits Sensation intact to light touch DTRs symmetric Left facial weakness (baseline) Motor: B/l UE 4-5+/5 proximal to distal RLE: 4+-5/5 proximal to distal  LLE: Hip flexion 4-/5, knee extension 4/5, ankle dorsi/plantar flexion 4+/5  Skin: Skin is warm and dry.  Psychiatric: He has a  normal mood and affect. His behavior is normal.     Lab Results Last 24 Hours    Results for orders placed or performed during the hospital encounter of 06/20/15 (from the past 24 hour(s))  Blood Alcohol Level Status: None   Collection Time: 06/20/15 3:49 PM  Result Value Ref Range   Alcohol, Ethyl (B) <5 <5 mg/dL  Urine rapid drug screen (hosp performed)not at [facility] Status: None   Collection Time: 06/20/15 4:32 PM  Result Value Ref Range   Opiates NONE DETECTED NONE DETECTED   Cocaine NONE DETECTED NONE DETECTED   Benzodiazepines NONE DETECTED NONE DETECTED   Amphetamines NONE DETECTED NONE DETECTED   Tetrahydrocannabinol NONE DETECTED NONE DETECTED   Barbiturates NONE DETECTED NONE DETECTED  Lipid panel Status: None   Collection Time: 06/21/15 5:38 AM  Result Value Ref Range   Cholesterol 115 0 - 200 mg/dL   Triglycerides 71 <150 mg/dL   HDL 45 >40 mg/dL   Total CHOL/HDL Ratio 2.6 RATIO   VLDL 14 0 - 40 mg/dL   LDL Cholesterol 56 0 - 99 mg/dL      Imaging Results (Last 48 hours)    Mr Brain Wo Contrast (neuro Protocol)  06/20/2015 CLINICAL DATA: 82 year old male with left-sided deficits since yesterday. Fall twice since episode. History of vestibular schwannoma resection 1985. Initial encounter. EXAM: MRI HEAD WITHOUT CONTRAST TECHNIQUE: Multiplanar, multiecho pulse sequences of the brain and surrounding structures were obtained without intravenous contrast. COMPARISON: 05/04/2009. FINDINGS: Small acute nonhemorrhagic infarct posterior limb right internal capsule. Question tiny medial left parietal lobe infarct. Marked chronic microvascular changes. Global moderate atrophy without hydrocephalus. No intracranial hemorrhage. Postsurgical changes left mastoid region. No obvious recurrent mass. Major intracranial vascular structures are patent. Narrowing left vertebral artery suspected. Post lens  replacement. Paranasal sinus mucosal thickening/opacification most notable right maxillary sinus. Cervical medullary junction within normal limits. IMPRESSION: Small acute nonhemorrhagic infarct posterior limb right internal capsule. Question tiny medial left parietal lobe infarct. Marked chronic microvascular changes. Global moderate atrophy without hydrocephalus. No intracranial hemorrhage. Postsurgical changes left mastoid region. No obvious recurrent mass. Major intracranial vascular structures are patent. Narrowing left vertebral artery suspected. Paranasal sinus mucosal thickening/opacification most notable right maxillary sinus. Electronically Signed By: Genia Del M.D. On: 06/20/2015 12:38   Mr Jodene Nam Head/brain Wo Cm  06/20/2015 CLINICAL DATA: Stroke. Acute left-sided weakness. EXAM: MRA HEAD WITHOUT CONTRAST TECHNIQUE: Angiographic images of the Circle of Willis were obtained using MRA technique without intravenous contrast. COMPARISON: MRI 06/20/2015 FINDINGS: Image quality degraded by motion. This level of motion obscures detail in small vessels. Both vertebral arteries patent to the basilar. PICA patent bilaterally. Basilar is patent. Superior cerebellar arteries patent bilaterally. Fetal origin of the right posterior cerebral artery. Internal carotid artery is patent bilaterally without significant stenosis. Anterior and middle cerebral arteries are patent bilaterally. No aneurysm identified IMPRESSION: Image quality degraded by mild to moderate motion. No large vessel occlusion. Electronically Signed By: Franchot Gallo M.D. On: 06/20/2015 18:10     Assessment/Plan: Diagnosis: Nonhemorrhagic infarct posterior limb right internal capsule  Labs and images independently reviewed. Records reviewed and summated above. Stroke: Continue secondary stroke prophylaxis and Risk Factor Modification listed below:  Antiplatelet therapy:  Blood Pressure Management: Continue current  medication with prn's with permisive HTN per primary team Statin Agent:  LLE sided hemiparesis Motor recovery: Fluoxetine  1. Does the need for close, 24 hr/day medical supervision in concert with the patient's rehab needs make it unreasonable for this patient to be served in a less intensive setting? Yes 2. Co-Morbidities requiring supervision/potential complications: HTN (monitor and provide prns in accordance with increased physical exertion and pain), CAD status post CABG (cont meds), prostate cancer, COPD (monitor O2 sats and RR with increased physical activity), diastolic congestive heart failure (monitor weights), bradycardia (monitor HR with increased physical activity for appropriate response), Thrombocytopenia (< 60,000/mm3 no resistive exercise), CKD (avoid nephrotoxic meds) 3. Due to bladder management, safety, disease management, medication administration and patient education, does the patient require 24 hr/day rehab nursing? Yes 4. Does the patient require coordinated care of a physician, rehab nurse, PT (1-2 hrs/day, 5 days/week) and OT (1-2 hrs/day, 5 days/week) to address physical and functional deficits in the context of the above medical diagnosis(es)? Yes Addressing deficits in the following areas: balance, endurance, locomotion, strength, transferring, toileting and psychosocial support 5. Can the patient actively participate in an intensive therapy program of at least 3 hrs of therapy per day at least 5 days per week? Yes 6. The potential for patient to make measurable gains while on inpatient rehab is excellent 7. Anticipated functional outcomes upon discharge from inpatient rehab are supervision and min assist with PT, modified independent and supervision with OT, n/a  with SLP. 8. Estimated rehab length of stay to reach the above functional goals is: 15-18 days. 9. Does the patient have adequate social supports and living environment to accommodate these discharge  functional goals? Yes 10. Anticipated D/C setting: Home 11. Anticipated post D/C treatments: HH therapy and Home excercise program 12. Overall Rehab/Functional Prognosis: good  RECOMMENDATIONS: This patient's condition is appropriate for continued rehabilitative care in the following setting: CIR after completion of medical workup.  Patient has agreed to participate in recommended program. Yes Note that insurance prior authorization may be required for reimbursement for recommended care.  Comment: Rehab Admissions Coordinator to follow up.   Delice Lesch, MD 06/21/2015       Revision History     Date/Time User Provider Type Action   06/21/2015 1:29 PM Ankit Lorie Phenix, MD Physician Sign   06/21/2015 11:17 AM Cathlyn Parsons, PA-C Physician Assistant P[place]   View Details Report       Routing History     Date/Time From To Method   06/21/2015 1:29 PM Ankit Lorie Phenix, MD [name], MD [facility]

## 2015-06-24 NOTE — Care Management Important Message (Signed)
Important Message  Patient Details  Name: Andrew [name] MRN: [id number] Date of Birth: 02-25-1923   Medicare Important Message Given:  Yes    Loann Quill 06/24/2015, 9:56 AM

## 2015-06-24 NOTE — Progress Notes (Signed)
Patient is being transferred to [place].

## 2015-06-25 ENCOUNTER — Inpatient Hospital Stay (HOSPITAL_COMMUNITY): Payer: Medicare Other | Admitting: Physical Therapy

## 2015-06-25 ENCOUNTER — Inpatient Hospital Stay (HOSPITAL_COMMUNITY): Payer: Medicare Other

## 2015-06-25 NOTE — Progress Notes (Signed)
Physical Therapy Session Note  Patient Details  Name: Andrew [name] MRN: [id number] Date of Birth: December 19, 1923  Today's Date: 06/25/2015 PT Individual Time: 1301-1400 PT Individual Time Calculation (min): 59 min   Short Term Goals: Week 1:  PT Short Term Goal 1 (Week 1): STG=LTG due to ELOS.   Skilled Therapeutic Interventions/Progress Updates:  Patient received sitting in recliner and agreeable to PT. Patient's daughter and wife present for treatment.   Patient instructed in Berg balance test; see below. Noted to have mild trunkal ataxia during berg balance test and no UE support.   Gait training with RW for 90 ft with min A from PT as well as min cues for improved erect posture, forward gaze, improved AD management with turns and increased step length on the LLE. Patient responded very well to instruction from PT and was noted to have only one foot drag with gait training of 90 ft. Side stepping with RW for 21ft L and R with min A from PT and mod cues for AD management and proper sequencing of movements to improve safety and stability.   Patient performed WC mobility for 136ft and 75 ft using BUE for prolusion with min cues from PT for improved use of BUE to decrease turning radius for doorway and obstacle management.   Throughout treatment patient performed sit<>stand transfers with RW with min A and mod cues for proper UE placement to push form sitting surface and improved control of descent.   Patient returned to room and left supine in bed following WC>bed transfer with min. Sit<>supine transfer performed with supervision A from PT with min cues for LLE management and improved positioning once in bed. Call bell was within reach at end of session.    Therapy Documentation Precautions:  Precautions Precautions: Fall Precaution Comments: macular degeneration Restrictions Weight Bearing Restrictions: No General:   Vital Signs: Therapy Vitals Temp: 98.2 F (36.8 C) Temp Source:  Oral Pulse Rate: 70 Resp: 20 BP: (!) 147/56 mmHg Patient Position (if appropriate): Lying Oxygen Therapy SpO2: 98 % O2 Device: Not Delivered  Balance: Balance Balance Assessed: Yes Standardized Balance Assessment Standardized Balance Assessment: Berg Balance Test Berg Balance Test Sit to Stand: Able to stand using hands after several tries Standing Unsupported: Able to stand safely 2 minutes Sitting with Back Unsupported but Feet Supported on Floor or Stool: Able to sit safely and securely 2 minutes Stand to Sit: Controls descent by using hands Transfers: Needs one person to assist Standing Unsupported with Eyes Closed: Able to stand 10 seconds safely Standing Ubsupported with Feet Together: Needs help to attain position and unable to hold for 15 seconds From Standing, Reach Forward with Outstretched Arm: Can reach forward >12 cm safely (5") From Standing Position, Pick up Object from Floor: Able to pick up shoe, needs supervision From Standing Position, Turn to Look Behind Over each Shoulder: Needs assist to keep from losing balance and falling Turn 360 Degrees: Needs assistance while turning Standing Unsupported, Alternately Place Feet on Step/Stool: Needs assistance to keep from falling or unable to try Standing Unsupported, One Foot in Front: Needs help to step but can hold 15 seconds Standing on One Leg: Unable to try or needs assist to prevent fall Total Score: 25  Patient demonstrates increased fall risk as noted by score of  25 /56 on Berg Balance Scale.  (<36= high risk for falls, close to 100%; 37-45 significant >80%; 46-51 moderate >50%; 52-55 lower >25%)    See Function Navigator for  Current Functional Status.   Therapy/Group: Individual Therapy  Lorie Phenix 06/25/2015, 3:48 PM

## 2015-06-25 NOTE — Evaluation (Signed)
Physical Therapy Assessment and Plan  Patient Details  Name: Andrew [name] MRN: [id number] Date of Birth: Dec 18, 1923  PT Diagnosis: Abnormality of gait, Ataxia, Ataxic gait, Difficulty walking, Hemiplegia non-dominant and Muscle weakness Rehab Potential: Good ELOS: 7-14 days    Today's Date: 06/25/2015 PT Individual Time: 4098-1191 PT Individual Time Calculation (min): 79 min    Problem List:  Patient Active Problem List   Diagnosis Date Noted  . Left hemiparesis ([place]) 06/24/2015  . Alcohol abuse   . Benign essential HTN   . Coronary artery disease involving coronary bypass graft of native heart without angina pectoris   . Bradycardia   . Thrombocytopenia ([place])   . Right-sided cerebrovascular accident (CVA) ([place])   . CVA (cerebral infarction) 06/20/2015  . Stroke ([place]) 06/20/2015  . Macrocytosis without anemia 06/20/2015  . Hyperbilirubinemia 06/20/2015  . Cerebral infarction due to unspecified mechanism   . Chronic renal insufficiency, stage III (moderate) 06/02/2014  . Macular degeneration (senile) of retina 04/29/2014  . 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 Major depressive disorder, single episode 06/11/2013  . TIA (transient ischemic attack) 06/03/2013  . Pancreatic cyst 01/29/2013  . OSA (obstructive sleep apnea) 12/26/2012  . Chronic diastolic CHF (congestive heart failure) ([place]) 08/19/2012  . COPD, minimal-mild 12/22/2010  . S/P excision of acoustic neuroma 06/18/2010  . Pulmonary nodule 06/17/2010  . Vertigo 06/14/2010  . THYROID NODULE 03/13/2010  . PVD- moderate carotid disease 03/07/2010  . Spinal stenosis, lumbar region, with neurogenic claudication 07/13/2009  . ALLERGIC RHINITIS 09/01/2008  . COLONIC POLYPS, HX OF 09/01/2008  . HYPERCHOLESTEROLEMIA 06/18/2008  . Angina, class II ([place]) 06/18/2008  . Hx of CABG 06/18/2008  . Moderate aortic stenosis 06/18/2008  . GASTROESOPHAGEAL REFLUX DISEASE 06/18/2008    Past Medical History:  Past Medical History   Diagnosis Date  . HYPERCHOLESTEROLEMIA   . CAD, ARTERY BYPASS GRAFT   . CAROTID ARTERY STENOSIS 03/07/2010    80%  . AORTIC STENOSIS   . GASTROESOPHAGEAL REFLUX DISEASE   . Skin cancer   . History of colonic polyps     1999, 2004  . Allergic rhinitis   . Thyroid nodule 05/2010    Abnormal biopsy, 82 year old patient and his wife have made informed decision to not pursue surgical resection. Potential risk including cancer has been thoroughly discussed with the patient.  . S/P excision of acoustic neuroma   . History of prostate cancer 2002    s/p treatment with seeds / radiation  . Lumbar spinal stenosis 07/13/2009  . COPD, mild ([place]) 12/22/2010  . Pancreatic cyst 01/29/2013    Noted on MRI scan from [facility] on 07/27/2011.  I reviewed report at patient request on 01-29-2013.  "Small cystic focus in the posterior pancreatic head.  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  Follow up MRI is advised. The main pancreatic duct remains normal in appearance."  Patient do  . CHF (congestive heart failure) ([place])   . Macular degeneration (senile) of retina 04/29/2014   Past Surgical History:  Past Surgical History  Procedure Laterality Date  . Coronary artery bypass graft    . Transperineal implatation of palladium    . Cholecystectomy    . Shoulder surgery    . Total knee arthroplasty    . Cataract extraction    . Tonsillectomy and adenoidectomy  1932  . Craniectomy for excision of acoustic neuroma  1985  . Cataract  extraction  2010  . Eye surgery      Assessment & Plan Clinical Impression: Patient is a 82 y.o. right handed male with history of hypertension, CAD status post CABG maintain on aspirin 325 mg daily, prostate cancer, COPD With remote tobacco abuse, diastolic congestive heart failure, Left carotid stenosis 60-79% identified January 2017. Per chart review patient lives with  wife. Independent prior to admission using a cane. He does not drive. Wife can assist. Daughter and Rondall Allegra. Presented 06/20/2015 with left-sided weakness. MRI showed small acute nonhemorrhagic infarct posterior limb right internal capsule. Question tiny medial left parietal lobe infarct. MRA no large vessel occlusion. Patient did not receive TPA. Echocardiogram with ejection fraction of 58% grade 2 diastolic dysfunction and no wall motion abnormalities. Follow-up carotid Dopplers 06/22/2015 again shows 60-79% left ICA stenosis no significant changes compared to January 2017.  Patient transferred to CIR on 06/24/2015 .   Patient currently requires min with mobility secondary to muscle weakness, decreased cardiorespiratoy endurance, impaired timing and sequencing, unbalanced muscle activation, ataxia and decreased coordination and decreased standing balance, hemiplegia and decreased balance strategies.  Prior to hospitalization, patient was modified independent  with mobility and lived with Spouse in a House home.  Home access is 4Stairs to enter.  Patient will benefit from skilled PT intervention to maximize safe functional mobility, minimize fall risk and decrease caregiver burden for planned discharge home with 24 hour supervision.  Anticipate patient will benefit from follow up [place] at discharge.  PT - End of Session Activity Tolerance: Tolerates 30+ min activity with multiple rests PT Assessment Rehab Potential (ACUTE/IP ONLY): Good PT Patient demonstrates impairments in the following area(s): Balance;Endurance;Motor;Safety PT Transfers Functional Problem(s): Bed Mobility;Bed to Chair;Car;Furniture;Floor PT Locomotion Functional Problem(s): Ambulation;Wheelchair Mobility;Stairs PT Plan PT Intensity: Minimum of 1-2 x/day ,45 to 90 minutes PT Frequency: 5 out of 7 days PT Duration Estimated Length of Stay: 7-14 days  PT Treatment/Interventions: Ambulation/gait training;Balance/vestibular  training;Cognitive remediation/compensation;Community reintegration;Discharge planning;Disease management/prevention;DME/adaptive equipment instruction;Functional mobility training;Neuromuscular re-education;Pain management;Patient/family education;Psychosocial support;Skin care/wound management;Splinting/orthotics;Stair training;Therapeutic Activities;Therapeutic Exercise;UE/LE Strength taining/ROM;UE/LE Coordination activities;Visual/perceptual remediation/compensation;Wheelchair propulsion/positioning PT Transfers Anticipated Outcome(s): Supervision with LRAD PT Locomotion Anticipated Outcome(s): Supervision with LRAD.  PT Recommendation Follow Up Recommendations: Home health PT Patient destination: Home Equipment Recommended: Rolling walker with 5" wheels;Wheelchair (measurements);Wheelchair cushion (measurements);To be determined;Other (comment)  Skilled Therapeutic Intervention PT performed evaluation; see below for details.  PT instructed patient in car transfer with min A and RW. Min cues for improved UE positioning and cues for increased safety. Patient instructed in gait training for 75 ft x 2 with RW, min A, and noted ataxia on the LLE as well as mild hip instability.  Throughout treatment patient performed 10 sit<>stand transfers with RW with min A from PT and constanct cues for improved safety with push from sitting surface.   Patient performed 80mT with RW: 0.22 m/s  Patient left sitting in recliner at end of PT session.     PT Evaluation Precautions/Restrictions Precautions Precautions: Fall Precaution Comments: macular degeneration Restrictions Weight Bearing Restrictions: No General   Vital Signs   Pain Pain Assessment Pain Assessment: No/denies pain Home Living/Prior Functioning Home Living Available Help at Discharge: Family Type of Home: House Home Access: Stairs to enter ETechnical brewerof Steps: 4 Entrance Stairs-Rails: Right;Left;Can reach both Home  Layout: One level Bathroom Shower/Tub: Walk-in shower (4 inch step over) Bathroom Toilet: Standard Bathroom Accessibility: Yes Additional Comments: uses cane for all mobililty.   Lives With: Spouse Prior Function Level of  Independence: Requires assistive device for independence;Independent with basic ADLs  Able to Take Stairs?: Yes Driving: No Vocation: Retired Comments: HOH with r ear hearing aid, prior balance issues but no falls until day of admission Vision/Perception  Vision - Assessment Additional Comments: macular degeneration bilaterally, L>R  Cognition Overall Cognitive Status: Within Functional Limits for tasks assessed Arousal/Alertness: Awake/alert Orientation Level: Oriented X4 Attention: Alternating Memory: Appears intact Awareness: Appears intact Problem Solving: Appears intact Safety/Judgment: Impaired Comments: decreased safety with RW; often bumps into items (largely due to macular degeneration and in unfamiliar environment) Sensation Sensation Light Touch: Appears Intact Hot/Cold: Appears Intact Proprioception: Appears Intact Coordination Gross Motor Movements are Fluid and Coordinated: No Fine Motor Movements are Fluid and Coordinated: No Coordination and Movement Description: mild ataxia in L UE and L LE  Finger Nose Finger Test: decreased speed bilaterally with mild ataxic movement in LUE Heel Shin Test: WNL RLE and ataxic movement on the LLE Motor  Motor Motor: Hemiplegia;Ataxia Motor - Skilled Clinical Observations: decreased strength in L UE and LLE with ataxic movement on the LLE.   Mobility Bed Mobility Bed Mobility: Rolling Right;Rolling Left;Supine to Sit;Sit to Supine Rolling Right: 5: Supervision Rolling Right Details: Verbal cues for technique Rolling Left: 5: Supervision Rolling Left Details: Verbal cues for technique Supine to Sit: 4: Min assist Supine to Sit Details: Verbal cues for precautions/safety;Verbal cues for technique;Verbal  cues for sequencing;Tactile cues for posture;Tactile cues for placement Sit to Supine: 5: Supervision Sit to Supine - Details: Verbal cues for sequencing;Verbal cues for technique;Verbal cues for precautions/safety Transfers Transfers: Yes Sit to Stand: 4: Min assist Sit to Stand Details: Verbal cues for sequencing;Verbal cues for technique;Verbal cues for precautions/safety Stand to Sit: 4: Min assist Stand to Sit Details (indicate cue type and reason): Verbal cues for sequencing;Verbal cues for technique;Verbal cues for precautions/safety Stand Pivot Transfers: 4: Min assist Stand Pivot Transfer Details: Verbal cues for sequencing;Verbal cues for technique;Verbal cues for precautions/safety;Verbal cues for gait pattern;Verbal cues for safe use of DME/AE Locomotion  Ambulation Ambulation: Yes Ambulation/Gait Assistance: 4: Min assist Ambulation Distance (Feet): 75 Feet Assistive device: Rolling walker Ambulation/Gait Assistance Details: Tactile cues for weight shifting;Tactile cues for placement;Verbal cues for sequencing;Verbal cues for technique;Verbal cues for precautions/safety;Verbal cues for gait pattern;Verbal cues for safe use of DME/AE Gait Gait: Yes Gait Pattern: Impaired Gait Pattern: Step-through pattern;Decreased step length - left;Decreased stance time - left;Ataxic;Left foot flat;Trunk flexed Gait velocity: decreased for age.  Stairs / Additional Locomotion Stairs: Yes Stairs Assistance: 4: Min assist Stairs Assistance Details: Verbal cues for sequencing;Verbal cues for technique;Verbal cues for precautions/safety;Verbal cues for gait pattern;Verbal cues for safe use of DME/AE;Tactile cues for sequencing;Tactile cues for weight shifting Stair Management Technique: Two rails Number of Stairs: 8 Height of Stairs: 6 Curb: 4: Min Chemical engineer: Yes Wheelchair Assistance: 5: Investment banker, operational Details: Verbal cues for  technique;Verbal cues for Information systems manager: Both upper extremities Wheelchair Parts Management: Supervision/cueing Distance: 269f  Trunk/Postural Assessment  Cervical Assessment Cervical Assessment: [facility] Thoracic Assessment Thoracic Assessment: Within Functional Limits Lumbar Assessment Lumbar Assessment: Within Functional Limits Postural Control Postural Control: Deficits on evaluation  Balance Balance Balance Assessed: Yes Dynamic Sitting Balance Dynamic Sitting - Balance Support: During functional activity (donning pants. ) Dynamic Sitting - Level of Assistance: 5: Stand by assistance Static Standing Balance Static Standing - Balance Support: No upper extremity supported Static Standing - Level of Assistance: 4: Min assist Dynamic Standing Balance Dynamic  Standing - Balance Support: During functional activity;Bilateral upper extremity supported Dynamic Standing - Level of Assistance: 4: Min assist Extremity Assessment  RUE Assessment RUE Assessment: Exceptions to [facility] (mild weakness gross 4/5 strength ) LUE Assessment LUE Assessment: Exceptions to [facility] (mild weakness; grossly 4-/5) RLE Assessment RLE Assessment: Within Functional Limits LLE Assessment LLE Assessment: Exceptions to [facility] (grossly 4-/5 throughout )   See Function Navigator for Current Functional Status.   Refer to Care Plan for Long Term Goals  Recommendations for other services: None  Discharge Criteria: Patient will be discharged from PT if patient refuses treatment 3 consecutive times without medical reason, if treatment goals not met, if there is a change in medical status, if patient makes no progress towards goals or if patient is discharged from hospital.  The above assessment, treatment plan, treatment alternatives and goals were discussed and mutually agreed upon: by patient  Lorie Phenix 06/25/2015, 12:21 PM

## 2015-06-25 NOTE — Evaluation (Signed)
Occupational Therapy Assessment and Plan  Patient Details  Name: Andrew [name] MRN: [id number] Date of Birth: 1923/02/15  OT Diagnosis: abnormal posture, ataxia, hemiplegia affecting non-dominant side and muscle weakness (generalized) Rehab Potential: Rehab Potential (ACUTE ONLY): Good ELOS: 7-10 days   Today's Date: 06/25/2015 OT Individual Time: 8182-9937 OT Individual Time Calculation (min): 60 min     Problem List:  Patient Active Problem List   Diagnosis Date Noted  . Left hemiparesis ([place]) 06/24/2015  . Alcohol abuse   . Benign essential HTN   . Coronary artery disease involving coronary bypass graft of native heart without angina pectoris   . Bradycardia   . Thrombocytopenia ([place])   . Right-sided cerebrovascular accident (CVA) ([place])   . CVA (cerebral infarction) 06/20/2015  . Stroke ([place]) 06/20/2015  . Macrocytosis without anemia 06/20/2015  . Hyperbilirubinemia 06/20/2015  . Cerebral infarction due to unspecified mechanism   . Chronic renal insufficiency, stage III (moderate) 06/02/2014  . Macular degeneration (senile) of retina 04/29/2014  . 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 Major depressive disorder, single episode 06/11/2013  . TIA (transient ischemic attack) 06/03/2013  . Pancreatic cyst 01/29/2013  . OSA (obstructive sleep apnea) 12/26/2012  . Chronic diastolic CHF (congestive heart failure) ([place]) 08/19/2012  . COPD, minimal-mild 12/22/2010  . S/P excision of acoustic neuroma 06/18/2010  . Pulmonary nodule 06/17/2010  . Vertigo 06/14/2010  . THYROID NODULE 03/13/2010  . PVD- moderate carotid disease 03/07/2010  . Spinal stenosis, lumbar region, with neurogenic claudication 07/13/2009  . ALLERGIC RHINITIS 09/01/2008  . COLONIC POLYPS, HX OF 09/01/2008  . HYPERCHOLESTEROLEMIA 06/18/2008  . Angina, class II ([place]) 06/18/2008  . Hx of CABG 06/18/2008  . Moderate aortic stenosis 06/18/2008  . GASTROESOPHAGEAL REFLUX DISEASE 06/18/2008    Past Medical History:   Past Medical History  Diagnosis Date  . HYPERCHOLESTEROLEMIA   . CAD, ARTERY BYPASS GRAFT   . CAROTID ARTERY STENOSIS 03/07/2010    80%  . AORTIC STENOSIS   . GASTROESOPHAGEAL REFLUX DISEASE   . Skin cancer   . History of colonic polyps     1999, 2004  . Allergic rhinitis   . Thyroid nodule 05/2010    Abnormal biopsy, 82 year old patient and his wife have made informed decision to not pursue surgical resection. Potential risk including cancer has been thoroughly discussed with the patient.  . S/P excision of acoustic neuroma   . History of prostate cancer 2002    s/p treatment with seeds / radiation  . Lumbar spinal stenosis 07/13/2009  . COPD, mild ([place]) 12/22/2010  . Pancreatic cyst 01/29/2013    Noted on MRI scan from [facility] on 07/27/2011.  I reviewed report at patient request on 01-29-2013.  "Small cystic focus in the posterior pancreatic head.  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  Follow up MRI is advised. The main pancreatic duct remains normal in appearance."  Patient do  . CHF (congestive heart failure) ([place])   . Macular degeneration (senile) of retina 04/29/2014   Past Surgical History:  Past Surgical History  Procedure Laterality Date  . Coronary artery bypass graft    . Transperineal implatation of palladium    . Cholecystectomy    . Shoulder surgery    . Total knee arthroplasty    . Cataract extraction    . Tonsillectomy and adenoidectomy  1932  . Craniectomy for excision of acoustic neuroma  1985  .  Cataract extraction  2010  . Eye surgery      Assessment & Plan Clinical Impression: Andrew [name] is a 82 y.o. right handed male with history of hypertension, CAD status post CABG maintain on aspirin 325 mg daily, prostate cancer, COPDWith remote tobacco abuse, diastolic congestive heart failure, Left carotid stenosis 60-79% identified January 2017. Per chart  review patient lives with wife. Independent prior to admission using a cane. He does not drive. Wife can assist. Daughter and Rondall Allegra. Presented 06/20/2015 with left-sided weakness. MRI showed small acute nonhemorrhagic infarct posterior limb right internal capsule. Question tiny medial left parietal lobe infarct. MRA no large vessel occlusion. Patient did not receive TPA. Echocardiogram with ejection fraction of 08% grade 2 diastolic dysfunction and no wall motion abnormalities. Follow-up carotid Dopplers 06/22/2015 again shows 60-79% left ICA stenosis no significant changes compared to January 2017. Neurology follow-up presently maintained on aspirin. Tolerating a regular consistency diet.Physical and Occupational therapy evaluations completed with recommendations of physical medicine rehabilitation consult. Patient transferred to CIR on 06/24/2015 .    Patient currently requires min with basic self-care skills secondary to muscle weakness, decreased cardiorespiratoy endurance, decreased visual acuity, decreased safety awareness and decreased standing balance, decreased postural control, hemiplegia and decreased balance strategies.  Prior to hospitalization, patient could complete BADLs with modified independent .  Patient will benefit from skilled intervention to increase independence with basic self-care skills prior to discharge home with care partner.  Anticipate patient will require intermittent supervision and follow-up to be determined.  OT - End of Session Activity Tolerance: Decreased this session OT Assessment Rehab Potential (ACUTE ONLY): Good OT Patient demonstrates impairments in the following area(s): Balance;Endurance;Motor;Cognition;Safety;Sensory;Perception;Vision OT Basic ADL's Functional Problem(s): Grooming;Bathing;Dressing;Toileting OT Transfers Functional Problem(s): Toilet;Tub/Shower OT Plan OT Intensity: Minimum of 1-2 x/day, 45 to 90 minutes OT Frequency: 5 out of 7  days OT Duration/Estimated Length of Stay: 7-10 days OT Treatment/Interventions: Balance/vestibular training;Cognitive remediation/compensation;Community reintegration;Discharge planning;DME/adaptive equipment instruction;Functional mobility training;Neuromuscular re-education;Patient/family education;Psychosocial support;Self Care/advanced ADL retraining;Therapeutic Activities;Therapeutic Exercise;UE/LE Strength taining/ROM;UE/LE Coordination activities;Visual/perceptual remediation/compensation OT Self Feeding Anticipated Outcome(s): n/a OT Basic Self-Care Anticipated Outcome(s): Mod I  OT Toileting Anticipated Outcome(s): Mod I OT Bathroom Transfers Anticipated Outcome(s): Mod I OT Recommendation Patient destination: Home Follow Up Recommendations: None Equipment Recommended: To be determined   Skilled Therapeutic Intervention OT evaluation completed. Discussed role of OT, goals of therapy, possible ELOS, DME, and safety plan. Pt completed bathing at shower level, ambulating in/out of bathroom with overall min A and use of RW. Pt required verbal cues for safety with RW as he frequently bumped into objects. Pt noted to have LOB 2x with turns to L side, requiring Mod A to correct.  Pt completed LB dressing with min A for standing balance and min A to don socks. At end of session, pt left sitting in recliner with all needs in reach.  OT Evaluation Precautions/Restrictions  Precautions Precautions: Fall Precaution Comments: macular degeneration Restrictions Weight Bearing Restrictions: No General   Vital Signs   Pain Pain Assessment Pain Assessment: No/denies pain Pain Score: 0-No pain Home Living/Prior Functioning Home Living Available Help at Discharge: Family Type of Home: House Home Access: Stairs to enter [employer] of Steps: 4 Entrance Stairs-Rails: Right, Left, Can reach both Home Layout: One level Bathroom Shower/Tub: Walk-in shower (4 inch step  over) Bathroom Toilet: Standard Bathroom Accessibility: Yes Additional Comments: uses cane for all mobililty.   Lives With: Spouse Prior Function Level of Independence: Requires assistive device for independence, Independent with  basic ADLs  Able to Take Stairs?: Yes Driving: No Vocation: Retired Comments: HOH with r ear hearing aid, prior balance issues but no falls until day of admission ADL   Vision/Perception  Vision- History Baseline Vision/History: Wears glasses;Macular Degeneration Wears Glasses: At all times Patient Visual Report: No change from baseline Vision- Assessment Vision Assessment?: Vision impaired- to be further tested in functional context Additional Comments: macular degeneration bilaterally, L>R  Cognition Overall Cognitive Status: Within Functional Limits for tasks assessed Arousal/Alertness: Awake/alert Orientation Level: Person;Place;Situation Person: Oriented Place: Oriented Situation: Oriented Year: 2017 Month: June Day of Week: Correct Memory: Appears intact Immediate Memory Recall: Sock;Blue;Bed Memory Recall: Sock;Blue;Bed Memory Recall Sock: Without Cue Memory Recall Blue: Without Cue Memory Recall Bed: Without Cue Attention: Alternating Awareness: Appears intact Problem Solving: Appears intact Safety/Judgment: Impaired Comments: decreased safety with RW; often bumps into items (largely due to macular degeneration and in unfamiliar environment) Sensation Sensation Light Touch: Appears Intact Hot/Cold: Appears Intact Proprioception: Appears Intact Coordination Gross Motor Movements are Fluid and Coordinated: No Fine Motor Movements are Fluid and Coordinated: No Coordination and Movement Description: mild ataxia in L UE and L LE  Finger Nose Finger Test: decreased speed bilaterally with mild ataxic movement in LUE Heel Shin Test: WNL RLE and ataxic movement on the LLE Motor  Motor Motor: Hemiplegia;Ataxia Motor - Skilled Clinical  Observations: decreased strength in L UE and LLE with ataxic movement on the LLE.  Mobility  Bed Mobility Bed Mobility: Rolling Right;Rolling Left;Supine to Sit;Sit to Supine Rolling Right: 5: Supervision Rolling Left: 5: Supervision Supine to Sit: 4: Min assist Sit to Supine: 5: Supervision Transfers Transfers: Sit to Stand;Stand to Sit Sit to Stand: 4: Min assist Sit to Stand Details: Verbal cues for sequencing;Verbal cues for technique;Verbal cues for precautions/safety Stand to Sit: 4: Min assist Stand to Sit Details (indicate cue type and reason): Verbal cues for sequencing;Verbal cues for technique;Verbal cues for precautions/safety  Trunk/Postural Assessment  Cervical Assessment Cervical Assessment: Exceptions to [facility] (forward head) Postural Control Postural Control: Deficits on evaluation  Balance Balance Balance Assessed: Yes Dynamic Sitting Balance Dynamic Sitting - Balance Support: During functional activity (doffing socks) Dynamic Sitting - Level of Assistance: 5: Stand by assistance Static Standing Balance Static Standing - Balance Support: Bilateral upper extremity supported Static Standing - Level of Assistance: 5: Stand by assistance Dynamic Standing Balance Dynamic Standing - Balance Support: During functional activity Dynamic Standing - Level of Assistance: 4: Min assist Extremity/Trunk Assessment RUE Assessment RUE Assessment: Exceptions to [facility] (mild weakness gross 4/5 strength ) LUE Assessment LUE Assessment: Exceptions to [facility] (mild weakness; grossly 4-/5)   See Function Navigator for Current Functional Status.   Refer to Care Plan for Long Term Goals  Recommendations for other services: None  Discharge Criteria: Patient will be discharged from OT if patient refuses treatment 3 consecutive times without medical reason, if treatment goals not met, if there is a change in medical status, if patient makes no progress towards goals or if patient is  discharged from hospital.  The above assessment, treatment plan, treatment alternatives and goals were discussed and mutually agreed upon: by patient  Duayne Cal 06/25/2015, 10:59 AM

## 2015-06-25 NOTE — Progress Notes (Signed)
[place] PHYSICAL MEDICINE & REHABILITATION     PROGRESS NOTE  Subjective/Complaints:  Patient seen sleeping in bed this morning. When woken up, he states he did not sleep well. He states he could not find his urinal overnight and wet himself. He thought he was a burden to the staff.  ROS: Denies CP, SOB, nausea, vomiting, diarrhea.  Objective: Vital Signs: Blood pressure 140/75, pulse 64, temperature 97.7 F (36.5 C), temperature source Oral, resp. rate 17, height 5\' 10"  (1.778 m), weight 83.416 kg (183 lb 14.4 oz), SpO2 95 %. No results found.  Recent Labs  06/23/15 0525  WBC 7.8  HGB 14.6  HCT 43.9  PLT 80*    Recent Labs  06/23/15 0525  NA 139  K 4.1  CL 106  GLUCOSE 88  BUN 23*  CREATININE 1.43*  CALCIUM 8.7*   CBG (last 3)  No results for input(s): GLUCAP in the last 72 hours.  Wt Readings from Last 3 Encounters:  06/25/15 83.416 kg (183 lb 14.4 oz)  06/20/15 88.451 kg (195 lb)  05/02/15 88.792 kg (195 lb 12 oz)    Physical Exam:  BP 140/75 mmHg  Pulse 64  Temp(Src) 97.7 F (36.5 C) (Oral)  Resp 17  Ht 5\' 10"  (1.778 m)  Wt 83.416 kg (183 lb 14.4 oz)  BMI 26.39 kg/m2  SpO2 95% Constitutional: He appears well-developed and well-nourished. NAD. HENT: fair dentition, oral mucosa pink and moist Head: Normocephalic and atraumatic.  Eyes: Conjunctivae and EOM are normal.  Cardiovascular: Normal rate and regular rhythm.  Respiratory: Effort normal and breath sounds normal. No respiratory distress.  GI: Soft. Bowel sounds are normal. He exhibits no distension.  Musculoskeletal: He exhibits no edema or tenderness.  Neurological: He is alert and oriented.  Patient is a bit hard of hearing.  Left facial weakness (baseline) Motor: B/l UE 4+/5 delt,bic,tric,wrist,hand RLE: 4+/5 proximal to distal  LLE: Hip flexion 3/5, knee extension 4/5, ankle dorsi/plantar flexion 4+/5  Skin: Skin is warm and dry.  Psychiatric: He has a normal mood and affect.  His behavior is normal  Assessment/Plan: 1. Functional deficits secondary to nonhemorrhagic infarct posterior limb right internal capsule which require 3+ hours per day of interdisciplinary therapy in a comprehensive inpatient rehab setting. Physiatrist is providing close team supervision and 24 hour management of active medical problems listed below. Physiatrist and rehab team continue to assess barriers to discharge/monitor patient progress toward functional and medical goals.  Function:  Bathing Bathing position      Bathing parts      Bathing assist        Upper Body Dressing/Undressing Upper body dressing                    Upper body assist        Lower Body Dressing/Undressing Lower body dressing                                  Lower body assist        Toileting Toileting   Toileting steps completed by patient: Performs perineal hygiene Toileting steps completed by helper: Adjust clothing prior to toileting, Adjust clothing after toileting    Toileting assist Assist level: Touching or steadying assistance (Pt.75%)   Transfers Chair/bed transfer             Locomotion Ambulation  Wheelchair          Cognition Comprehension Comprehension assist level: Follows basic conversation/direction with no assist  Expression Expression assist level: Expresses complex 90% of the time/cues < 10% of the time  Social Interaction Social Interaction assist level: Interacts appropriately 90% of the time - Needs monitoring or encouragement for participation or interaction.  Problem Solving Problem solving assist level: Solves complex 90% of the time/cues < 10% of the time  Memory Memory assist level: Recognizes or recalls 90% of the time/requires cueing < 10% of the time    Medical Problem List and Plan: 1. Left hemiparesis secondary to nonhemorrhagic infarct posterior limb right internal capsule Begin CIR 2. DVT  Prophylaxis/Anticoagulation: SCDs. Monitor for any signs of DVT 3. Pain Management: Tylenol as needed 4. Mood: Prozac 10 mg daily 5. Neuropsych: This patient is capable of making decisions on his own behalf. 6. Skin/Wound Care: Routine skin checks 7. Fluids/Electrolytes/Nutrition: Routine I&O with follow-up chemistries 8. Diastolic congestive heart failure. Lasix 40 mg daily. No signs of fluid overload. Weigh patient daily 9. Hypertension. Allow permissive hypertension. Patient on indoor 90 mg daily, Toprol 25 mg daily prior to admission. Resume as needed  Within normal limits at present 10. CAD status post CABG. Continue aspirin 11. Hyperlipidemia. Pravachol 12. History of alcohol use. Patient consumes approximately 4.2 ounces of alcohol per week. Monitor and provide counseling  LOS (Days) 1 A FACE TO FACE EVALUATION WAS PERFORMED  Andrew [name] Andrew [name] 06/25/2015 6:55 AM

## 2015-06-26 ENCOUNTER — Inpatient Hospital Stay (HOSPITAL_COMMUNITY): Payer: Medicare Other | Admitting: Physical Therapy

## 2015-06-26 DIAGNOSIS — I10 Essential (primary) hypertension: Secondary | ICD-10-CM | POA: Insufficient documentation

## 2015-06-26 DIAGNOSIS — H04123 Dry eye syndrome of bilateral lacrimal glands: Secondary | ICD-10-CM

## 2015-06-26 MED ORDER — SYSTANE NIGHTTIME OP OINT
1.0000 "application " | TOPICAL_OINTMENT | Freq: Every day | OPHTHALMIC | Status: DC
  Administered 2015-06-26 – 2015-06-27 (×2): 1 via OPHTHALMIC
  Filled 2015-06-26: qty 1

## 2015-06-26 MED ORDER — NON FORMULARY
1.0000 | Freq: Every day | Status: DC
Start: 2015-06-26 — End: 2015-06-26

## 2015-06-26 MED ORDER — DOCUSATE SODIUM 100 MG PO CAPS
100.0000 mg | ORAL_CAPSULE | Freq: Every day | ORAL | Status: DC
  Administered 2015-06-26 – 2015-07-01 (×6): 100 mg via ORAL
  Filled 2015-06-26 (×6): qty 1

## 2015-06-26 NOTE — Progress Notes (Signed)
[place] PHYSICAL MEDICINE & REHABILITATION     PROGRESS NOTE  Subjective/Complaints:  Patient seen lying in bed this morning. He states that he would like eyedrops with appointments to use at night. He notes that he has this at home and states his wife will bring in today. Otherwise, he states he had a good night. He also notes that he had "big help"from therapies yesterday.  ROS: + Chronic dry eyes. Denies CP, SOB, nausea, vomiting, diarrhea.  Objective: Vital Signs: Blood pressure 167/53, pulse 59, temperature 97.8 F (36.6 C), temperature source Oral, resp. rate 20, height 5\' 10"  (1.778 m), weight 84.6 kg (186 lb 8.2 oz), SpO2 99 %. No results found. No results for input(s): WBC, HGB, HCT, PLT in the last 72 hours. No results for input(s): NA, K, CL, GLUCOSE, BUN, CREATININE, CALCIUM in the last 72 hours.  Invalid input(s): CO CBG (last 3)  No results for input(s): GLUCAP in the last 72 hours.  Wt Readings from Last 3 Encounters:  06/26/15 84.6 kg (186 lb 8.2 oz)  06/20/15 88.451 kg (195 lb)  05/02/15 88.792 kg (195 lb 12 oz)    Physical Exam:  BP 167/53 mmHg  Pulse 59  Temp(Src) 97.8 F (36.6 C) (Oral)  Resp 20  Ht 5\' 10"  (1.778 m)  Wt 84.6 kg (186 lb 8.2 oz)  BMI 26.76 kg/m2  SpO2 99% Constitutional: He appears well-developed and well-nourished. NAD. HENT: fair dentition, oral mucosa pink and moist Head: Normocephalic and atraumatic.  Eyes: Conjunctivae and EOM are normal.  Cardiovascular: Normal rate and regular rhythm.  Respiratory: Effort normal and breath sounds normal. No respiratory distress.  GI: Soft. Bowel sounds are normal. He exhibits no distension.  Musculoskeletal: He exhibits no edema or tenderness.  Neurological: He is alert and oriented.  Patient is hard of hearing.  Left facial weakness (baseline) Motor: B/l UE 4+/5 delt,bic,tric,wrist,hand RLE: 4+/5 proximal to distal  LLE: Hip flexion 3+/5, knee extension 4/5, ankle dorsi/plantar  flexion 4+/5  Skin: Skin is warm and dry.  Psychiatric: He has a normal mood and affect. His behavior is normal  Assessment/Plan: 1. Functional deficits secondary to nonhemorrhagic infarct posterior limb right internal capsule which require 3+ hours per day of interdisciplinary therapy in a comprehensive inpatient rehab setting. Physiatrist is providing close team supervision and 24 hour management of active medical problems listed below. Physiatrist and rehab team continue to assess barriers to discharge/monitor patient progress toward functional and medical goals.  Function:  Bathing Bathing position   Position: Shower  Bathing parts Body parts bathed by patient: Right arm, Left arm, Chest, Abdomen, Front perineal area, Buttocks, Right upper leg, Left upper leg Body parts bathed by helper: Back, Left lower leg, Right lower leg  Bathing assist Assist Level: Touching or steadying assistance(Pt > 75%)      Upper Body Dressing/Undressing Upper body dressing   What is the patient wearing?: Pull over shirt/dress     Pull over shirt/dress - Perfomed by patient: Thread/unthread right sleeve, Thread/unthread left sleeve, Put head through opening, Pull shirt over trunk          Upper body assist Assist Level: Set up      Lower Body Dressing/Undressing Lower body dressing   What is the patient wearing?: Pants, Non-skid slipper socks, Underwear Underwear - Performed by patient: Thread/unthread right underwear leg, Thread/unthread left underwear leg, Pull underwear up/down   Pants- Performed by patient: Thread/unthread right pants leg, Thread/unthread left pants leg, Pull pants up/down, Fasten/unfasten  pants     Non-skid slipper socks- Performed by helper: Don/doff right sock, Don/doff left sock                  Lower body assist Assist for lower body dressing: Touching or steadying assistance (Pt > 75%)      Toileting Toileting   Toileting steps completed by patient:  Adjust clothing prior to toileting, Performs perineal hygiene Toileting steps completed by helper: Adjust clothing prior to toileting, Adjust clothing after toileting Toileting Assistive Devices: Grab bar or rail  Toileting assist Assist level: Touching or steadying assistance (Pt.75%)   Transfers Chair/bed Clinical biochemist          Cognition Comprehension Comprehension assist level: Understands complex 90% of the time/cues 10% of the time  Expression Expression assist level: Expresses complex 90% of the time/cues < 10% of the time  Social Interaction Social Interaction assist level: Interacts appropriately 90% of the time - Needs monitoring or encouragement for participation or interaction.  Problem Solving Problem solving assist level: Solves complex 90% of the time/cues < 10% of the time  Memory Memory assist level: Recognizes or recalls 90% of the time/requires cueing < 10% of the time    Medical Problem List and Plan: 1. Left hemiparesis secondary to nonhemorrhagic infarct posterior limb right internal capsule  Continue CIR 2. DVT Prophylaxis/Anticoagulation: SCDs. Monitor for any signs of DVT 3. Pain Management: Tylenol as needed 4. Mood: Prozac 10 mg daily 5. Neuropsych: This patient is capable of making decisions on his own behalf. 6. Skin/Wound Care: Routine skin checks 7. Fluids/Electrolytes/Nutrition: Routine I&O with follow-up chemistries 8. Diastolic congestive heart failure. Lasix 40 mg daily. No signs of fluid overload. Weigh patient daily 9. Hypertension. Allow permissive hypertension. Patient on indoor 90 mg daily, Toprol 25 mg daily prior to admission. Resume as needed  Slightly elevated within last 24 hours, if this persists will consider increasing medications 10. CAD status post CABG. Continue aspirin 11. Hyperlipidemia. Pravachol 12. History of alcohol use. Patient consumes approximately 4.2 ounces of  alcohol per week. Monitor and provide counseling 13. Dry eyes: Patient to bring drops from home.  LOS (Days) 2 A FACE TO FACE EVALUATION WAS PERFORMED  Ankit Lorie Phenix 06/26/2015 6:41 AM

## 2015-06-26 NOTE — Progress Notes (Signed)
Physical Therapy Session Note  Patient Details  Name: Andrew [name] MRN: [id number] Date of Birth: [birth year]  Today's Date: 06/26/2015 PT Individual Time: 1300-1400 PT Individual Time Calculation (min): 60 min   Short Term Goals: Week 1:  PT Short Term Goal 1 (Week 1): STG=LTG due to ELOS.   Skilled Therapeutic Interventions/Progress Updates:    Pt does well with transfer not requiring UE support for transfers when cued for anterior weight shift but difficulty carrying over. Pt also demonstrates improvement initiating free gait. Pt would continue to benefit from skilled PT services to increase functional mobility.  Therapy Documentation Precautions:  Precautions Precautions: Fall Precaution Comments: macular degeneration Restrictions Weight Bearing Restrictions: No Pain: Pain Assessment Pain Assessment: No/denies pain Mobility:  Pt performs transfers with cues for weight shift , LE placement, neutral spine Locomotion :    Other Treatments:  Pt educated on rehab plan, safety in mobility, progressing mobility, and weight shifting. Pt performs standing B/L weight shifts, seated anterior weight shifts, marching ankle pumps 2x10. BLE advancement 2x10. Free gait 5'. Pt performs standing balance 1'x2.   See Function Navigator for Current Functional Status.   Therapy/Group: Individual Therapy  Monia Pouch 06/26/2015, 1:17 PM

## 2015-06-26 NOTE — Plan of Care (Signed)
Problem: RH BOWEL ELIMINATION Goal: RH STG MANAGE BOWEL WITH ASSISTANCE STG Manage Bowel with Mod Assistance.  Outcome: Not Progressing Request for daily stool softener per patient fluid pill makes him dry

## 2015-06-26 NOTE — Progress Notes (Signed)
Patient's family requested for MD to order eye drop for patient MD notified and he talked to patient about it

## 2015-06-27 ENCOUNTER — Inpatient Hospital Stay (HOSPITAL_COMMUNITY): Payer: Medicare Other | Admitting: Physical Therapy

## 2015-06-27 ENCOUNTER — Inpatient Hospital Stay (HOSPITAL_COMMUNITY): Payer: Medicare Other | Admitting: Occupational Therapy

## 2015-06-27 ENCOUNTER — Inpatient Hospital Stay (HOSPITAL_COMMUNITY): Payer: Medicare Other

## 2015-06-27 LAB — CBC WITH DIFFERENTIAL/PLATELET
Basophils Absolute: 0 10*3/uL (ref 0.0–0.1)
Basophils Relative: 0 %
EOS ABS: 0.3 10*3/uL (ref 0.0–0.7)
EOS PCT: 4 %
HCT: 41.8 % (ref 39.0–52.0)
Hemoglobin: 13.9 g/dL (ref 13.0–17.0)
Lymphocytes Relative: 29 %
Lymphs Abs: 2 10*3/uL (ref 0.7–4.0)
MCH: 33.9 pg (ref 26.0–34.0)
MCHC: 33.3 g/dL (ref 30.0–36.0)
MCV: 102 fL — AB (ref 78.0–100.0)
MONO ABS: 0.9 10*3/uL (ref 0.1–1.0)
Monocytes Relative: 13 %
Neutro Abs: 3.9 10*3/uL (ref 1.7–7.7)
Neutrophils Relative %: 54 %
PLATELETS: 110 10*3/uL — AB (ref 150–400)
RBC: 4.1 MIL/uL — AB (ref 4.22–5.81)
RDW: 13.3 % (ref 11.5–15.5)
WBC: 7.1 10*3/uL (ref 4.0–10.5)

## 2015-06-27 LAB — COMPREHENSIVE METABOLIC PANEL
ALT: 43 U/L (ref 17–63)
AST: 41 U/L (ref 15–41)
Albumin: 3.1 g/dL — ABNORMAL LOW (ref 3.5–5.0)
Alkaline Phosphatase: 64 U/L (ref 38–126)
Anion gap: 6 (ref 5–15)
BUN: 29 mg/dL — AB (ref 6–20)
CALCIUM: 8.8 mg/dL — AB (ref 8.9–10.3)
CHLORIDE: 106 mmol/L (ref 101–111)
CO2: 26 mmol/L (ref 22–32)
CREATININE: 1.61 mg/dL — AB (ref 0.61–1.24)
GFR calc Af Amer: 42 mL/min — ABNORMAL LOW (ref >60)
GFR, EST NON AFRICAN AMERICAN: 36 mL/min — AB (ref >60)
Glucose, Bld: 95 mg/dL (ref 65–99)
Potassium: 3.8 mmol/L (ref 3.5–5.1)
SODIUM: 138 mmol/L (ref 135–145)
TOTAL PROTEIN: 6 g/dL — AB (ref 6.5–8.1)
Total Bilirubin: 1.3 mg/dL — ABNORMAL HIGH (ref 0.3–1.2)

## 2015-06-27 NOTE — Progress Notes (Addendum)
Physical Therapy Note  Patient Details  Name: Andrew [name] MRN: [id number] Date of Birth: 1923-10-31 Today's Date: 06/27/2015  1530-1600, 30 min individual tx Pain: none reported  neuromuscular re-education for L stance stability and L foot placement: standing and sink with bil UE support for 8 x 1 each  heel /toe raises, mini squats, R/L hip abduction, hamstring curls; seated L heel taps onto orange floor targets .  therapeutic activity: sit>< stand repeatedly iwith cues for safe use of RW; toilet transfer and standing at sink for hand washing.  Pt required mod cues for locating all items at sink.  Gait in room with RW, mod cues for safe hand placement.   Pt left resting in bed with alarm set and all needs within reach.  [name] 06/27/2015, 4:15 PM

## 2015-06-27 NOTE — Progress Notes (Signed)
Physical Therapy Session Note  Patient Details  Name: Andrew [name] MRN: [id number] Date of Birth: 10/15/1923  Today's Date: 06/27/2015 PT Individual Time: 1000-1100 PT Individual Time Calculation (min): 60 min   Short Term Goals: Week 1:  PT Short Term Goal 1 (Week 1): STG=LTG due to ELOS.   Skilled Therapeutic Interventions/Progress Updates:    Pt received seated in chair with handoff from OT; denies pain and agreeable to treatment. Gait to gym with RW and min guard x175'. Occasional minor LOBs when distracted by surroundings and has poor LLE foot clearance and mild L inattention. Standing balance on foam and foam wedge with dynamic UE reaching for card matching task and pipe tree. Several LOBs posteriorly when reaching overhead and demonstrates poor ankle and righting reactions, improved with repetition. Stairs 1x12 on 3" and 6" corner stairs with B handrails and min guard faded to S. Reciprocal ascent, step-to descent. Gait x100' with RW and S. Transported remaining distance in w/c totalA for energy conservation. Stand pivot w/c >recliner with RW and S. Remained seated in recliner at completion of session, all needs within reach.   Therapy Documentation Precautions:  Precautions Precautions: Fall Precaution Comments: macular degeneration Restrictions Weight Bearing Restrictions: No Pain: Pain Assessment Pain Assessment: No/denies pain Pain Score: 0-No pain  See Function Navigator for Current Functional Status.   Therapy/Group: Individual Therapy  Luberta Mutter 06/27/2015, 11:01 AM

## 2015-06-27 NOTE — IPOC Note (Addendum)
Overall Plan of Care [facility]) Patient Details Name: Andrew [name] MRN: [id number] DOB: 1923-04-09  Admitting Diagnosis: cva  Hospital Problems: Active Problems:   CVA (cerebral infarction)   Cerebral infarction due to unspecified mechanism   Benign essential HTN   Left hemiparesis (HCC)   Essential hypertension   Dry eyes     Functional Problem List: Nursing Behavior, Bladder, Bowel, Endurance, Medication Management, Safety, Skin Integrity  PT Balance, Endurance, Motor, Safety  OT Balance, Endurance, Motor, Cognition, Safety, Sensory, Perception, Vision  SLP    TR         Basic ADL's: OT Grooming, Bathing, Dressing, Toileting     Advanced  ADL's: OT       Transfers: PT Bed Mobility, Bed to Chair, Car, [employer], Futures trader, Metallurgist: PT Ambulation, Emergency planning/management officer, Stairs     Additional Impairments: OT    SLP        TR      Anticipated Outcomes Item Anticipated Outcome  Self Feeding n/a  Swallowing      Basic self-care  Mod I   Toileting  Mod I   Bathroom Transfers Mod I  Bowel/Bladder  manage Bowel/Bladder with Mod Assist  Transfers  Supervision with LRAD  Locomotion  Supervision with LRAD.   Communication     Cognition     Pain  N/A  Safety/Judgment  Remain safe while   Therapy Plan: PT Intensity: Minimum of 1-2 x/day ,45 to 90 minutes PT Frequency: 5 out of 7 days PT Duration Estimated Length of Stay: 7-14 days  OT Intensity: Minimum of 1-2 x/day, 45 to 90 minutes OT Frequency: 5 out of 7 days OT Duration/Estimated Length of Stay: 7-10 days         Team Interventions: Nursing Interventions Patient/Family Education, Bladder Management, Bowel Management, Disease Management/Prevention, Medication Management, Discharge Planning, Psychosocial Support, Cognitive Remediation/Compensation  PT interventions Ambulation/gait training, Training and development officer, Cognitive remediation/compensation, Community  reintegration, Discharge planning, Disease management/prevention, DME/adaptive equipment instruction, Functional mobility training, Neuromuscular re-education, Pain management, Patient/family education, Psychosocial support, Skin care/wound management, Splinting/orthotics, Stair training, Therapeutic Activities, Therapeutic Exercise, UE/LE Strength taining/ROM, UE/LE Coordination activities, Visual/perceptual remediation/compensation, Wheelchair propulsion/positioning  OT Interventions Training and development officer, Cognitive remediation/compensation, Academic librarian, Discharge planning, DME/adaptive equipment instruction, Functional mobility training, Neuromuscular re-education, Patient/family education, Psychosocial support, Self Care/advanced ADL retraining, Therapeutic Activities, Therapeutic Exercise, UE/LE Strength taining/ROM, UE/LE Coordination activities, Visual/perceptual remediation/compensation  SLP Interventions    TR Interventions    SW/CM Interventions Discharge Planning, Psychosocial Support, Patient/Family Education    Team Discharge Planning: Destination: PT-Home ,OT- Home , SLP-  Projected Follow-up: PT-Home health PT, OT-  None, SLP-  Projected Equipment Needs: PT-Rolling walker with 5" wheels, Wheelchair (measurements), Wheelchair cushion (measurements), To be determined, Other (comment), OT- To be determined, SLP-  Equipment Details: PT- , OT-  Patient/family involved in discharge planning: PT- Patient,  OT-Patient, SLP-   MD ELOS: 7-9d Medical Rehab Prognosis:  Good Assessment: 82 y.o. right handed male with history of hypertension, CAD status post CABG maintain on aspirin 325 mg daily, prostate cancer, COPDWith remote tobacco abuse, diastolic congestive heart failure, Left carotid stenosis 60-79% identified January 2017. Per chart review patient lives with wife. Independent prior to admission using a cane. He does not drive. Wife can assist. Daughter and Rondall Allegra.  Presented 06/20/2015 with left-sided weakness. MRI showed small acute nonhemorrhagic infarct posterior limb right internal capsule. Question tiny medial left parietal lobe infarct. MRA no large vessel occlusion. Patient  did not receive TPA. Echocardiogram with ejection fraction of [id number] grade 2 diastolic dysfunction and no wall motion abnormalities. Follow-up carotid Dopplers 06/22/2015 again shows 60-79% left ICA stenosis no significant changes compared to January 2017. Neurology follow-up presently maintained on aspirin   Now requiring 24/7 Rehab RN,MD, as well as CIR level PT, OT and SLP.  Treatment team will focus on ADLs and mobility with goals set at Mod I  See Team Conference Notes for weekly updates to the plan of care

## 2015-06-27 NOTE — Progress Notes (Signed)
[facility] PHYSICAL MEDICINE & REHABILITATION     PROGRESS NOTE  Subjective/Complaints:   patient states he has limited vision from macular degeneration. No new issues overnight.  ROS: + Chronic dry eyes. Denies CP, SOB, nausea, vomiting, diarrhea.  Objective: Vital Signs: Blood pressure 158/74, pulse 78, temperature 98.7 F (37.1 C), temperature source Oral, resp. rate 17, height 5\' 10"  (1.778 m), weight 84.1 kg (185 lb 6.5 oz), SpO2 96 %. No results found. No results for input(s): WBC, HGB, HCT, PLT in the last 72 hours. No results for input(s): NA, K, CL, GLUCOSE, BUN, CREATININE, CALCIUM in the last 72 hours.  Invalid input(s): CO CBG (last 3)  No results for input(s): GLUCAP in the last 72 hours.  Wt Readings from Last 3 Encounters:  06/27/15 84.1 kg (185 lb 6.5 oz)  06/20/15 88.451 kg (195 lb)  05/02/15 88.792 kg (195 lb 12 oz)    Physical Exam:  BP 158/74 mmHg  Pulse 78  Temp(Src) 98.7 F (37.1 C) (Oral)  Resp 17  Ht 5\' 10"  (1.778 m)  Wt 84.1 kg (185 lb 6.5 oz)  BMI 26.60 kg/m2  SpO2 96% Constitutional: He appears well-developed and well-nourished. NAD. HENT: fair dentition, oral mucosa pink and moist Head: Normocephalic and atraumatic.  Eyes: Conjunctivae and EOM are normal.  Cardiovascular: Normal rate and regular rhythm.  Respiratory: Effort normal and breath sounds normal. No respiratory distress.  GI: Soft. Bowel sounds are normal. He exhibits no distension.  Musculoskeletal: He exhibits no edema or tenderness.  Neurological: He is alert and oriented.  Patient is hard of hearing.  Left facial weakness (baseline) Motor: B/l UE 4+/5 delt,bic,tric,wrist,hand RLE: 4+/5 proximal to distal  LLE: Hip flexion 3+/5, knee extension 4/5, ankle dorsi/plantar flexion 4+/5  Skin: Skin is warm and dry.  Psychiatric: He has a normal mood and affect. His behavior is normal  Assessment/Plan: 1. Functional deficits secondary to nonhemorrhagic infarct posterior  limb right internal capsule which require 3+ hours per day of interdisciplinary therapy in a comprehensive inpatient rehab setting. Physiatrist is providing close team supervision and 24 hour management of active medical problems listed below. Physiatrist and rehab team continue to assess barriers to discharge/monitor patient progress toward functional and medical goals.  Function:  Bathing Bathing position   Position: Shower  Bathing parts Body parts bathed by patient: Right arm, Left arm, Chest, Abdomen, Front perineal area, Buttocks, Right upper leg, Left upper leg Body parts bathed by helper: Back, Left lower leg, Right lower leg  Bathing assist Assist Level: Touching or steadying assistance(Pt > 75%)      Upper Body Dressing/Undressing Upper body dressing   What is the patient wearing?: Pull over shirt/dress     Pull over shirt/dress - Perfomed by patient: Thread/unthread right sleeve, Thread/unthread left sleeve, Put head through opening, Pull shirt over trunk          Upper body assist Assist Level: Set up      Lower Body Dressing/Undressing Lower body dressing   What is the patient wearing?: Pants, Non-skid slipper socks, Underwear Underwear - Performed by patient: Thread/unthread right underwear leg, Thread/unthread left underwear leg, Pull underwear up/down   Pants- Performed by patient: Thread/unthread right pants leg, Thread/unthread left pants leg, Pull pants up/down, Fasten/unfasten pants     Non-skid slipper socks- Performed by helper: Don/doff right sock, Don/doff left sock                  Lower body assist Assist for lower  body dressing: Touching or steadying assistance (Pt > 75%)      Toileting Toileting   Toileting steps completed by patient: Performs perineal hygiene Toileting steps completed by helper: Adjust clothing prior to toileting, Adjust clothing after toileting Toileting Assistive Devices: Grab bar or rail  Toileting assist Assist  level: Touching or steadying assistance (Pt.75%)   Transfers Chair/bed transfer   Chair/bed transfer method: Stand pivot Chair/bed transfer assist level: Touching or steadying assistance (Pt > 75%) Chair/bed transfer assistive device: Medical sales representative     Max distance: 23ft Assist level: Touching or steadying assistance (Pt > 75%)   Wheelchair   Type: Manual Max wheelchair distance: 131ft Assist Level: Supervision or verbal cues  Cognition Comprehension Comprehension assist level: Set up assist with hearing device  Expression Expression assist level: Expresses basic 90% of the time/requires cueing < 10% of the time.  Social Interaction Social Interaction assist level: Interacts appropriately 90% of the time - Needs monitoring or encouragement for participation or interaction.  Problem Solving Problem solving assist level: Solves basic 90% of the time/requires cueing < 10% of the time  Memory Memory assist level: Recognizes or recalls 90% of the time/requires cueing < 10% of the time    Medical Problem List and Plan: 1. Left hemiparesis secondary to nonhemorrhagic infarct posterior limb right internal capsule  Continue CIR, PT, OT 2. DVT Prophylaxis/Anticoagulation: SCDs. Monitor for any signs of DVT 3. Pain Management: Tylenol as needed 4. Mood: Prozac 10 mg daily 5. Neuropsych: This patient is capable of making decisions on his own behalf. 6. Skin/Wound Care: Routine skin checks 7. Fluids/Electrolytes/Nutrition: Routine I&O with follow-up chemistries, 100%meals, 758ml fluids 8. Diastolic congestive heart failure. Lasix 40 mg daily. No signs of fluid overload. Weigh patient daily 9. Hypertension. Allow permissive hypertension. Patient on Imdur 90 mg daily, Toprol 25 mg daily prior to admission. Resume as needed  Slightly elevated within last 24 hours, if this persists will consider increasing medications Filed Vitals:   06/26/15 1507 06/27/15 0607  BP:  122/47 158/74  Pulse: 69 78  Temp: 97.9 F (36.6 C) 98.7 F (37.1 C)  Resp: 18 17   10. CAD status post CABG. Continue aspirin 11. Hyperlipidemia. Pravachol 12. History of alcohol use. Patient consumes approximately 4.2 ounces of alcohol per week. Monitor and provide counseling, do not anticipate withdrawal with reported usage 13. Dry eyes: Patient to bring drops from home.  LOS (Days) 3 A FACE TO FACE EVALUATION WAS PERFORMED  KIRSTEINS,ANDREW E 06/27/2015 7:08 AM

## 2015-06-27 NOTE — Progress Notes (Signed)
Patient information reviewed and entered into eRehab system by Raneen Jaffer, RN, CRRN, PPS Coordinator.  Information including medical coding and functional independence measure will be reviewed and updated through discharge.     Per nursing patient was given "Data Collection Information Summary for Patients in Inpatient Rehabilitation Facilities with attached "Privacy Act Statement-Health Care Records" upon admission.  

## 2015-06-27 NOTE — Progress Notes (Signed)
Occupational Therapy Session Note  Patient Details  Name: Andrew [name] MRN: [id number] Date of Birth: 13-Apr-1923  Today's Date: 06/27/2015 OT Individual Time: 0845-1000 and 1435-1505 OT Individual Time Calculation (min): 75 min and 30 min   Short Term Goals: Week 1:  OT Short Term Goal 1 (Week 1): Focus on LTGs due to short ELOS  Skilled Therapeutic Interventions/Progress Updates:    1) Treatment session with focus on functional mobility, standing endurance, transfers, and education on DME for bathing.  Pt ambulated to room shower with RW and contact guard for obstacle negotiation and min cues when transferring into shower.  Bathing completed at sit > stand level with 1 LOB in standing but pt able to correct with grab bar and then lower self back to shower chair. Pt asking questions regarding DME for walk-in shower vs tub/shower.  Educated on crossing LLE over Rt knee to increase success with LB bathing and dressing tasks.  Grooming completed in standing at sink with pt demonstrating increased LLE instability in standing with lean to Lt and then requiring min assist for transfer back to EOB.  Completed 9 hole peg test with Rt: 44 seconds and Lt: 52 seconds - just slow and deliberate but no difficulty with coordination on either UE.  Engaged in sit > stand from arm chair without use of UE to challenge balance and leg strength with pt able to complete x3 with self reports of improvements from prior sessions.  2) Treatment session with focus on activity tolerance and standing balance.  Upon arrival, pt reports need to toilet.  Ambulated to toilet with RW and contact guard.  Pt completed toileting tasks with supervision.  Ambulated approx 100 feet with RW and contact guard, requiring cues for turns.  Engaged in 3D pipe tree puzzle in standing with focus on standing tolerance and LLE stability.  Pt tolerated standing 7 mins before requiring seated rest break.  Returned to room and left seated upright in  w/c with family present awaiting next therapy session.  Therapy Documentation Precautions:  Precautions Precautions: Fall Precaution Comments: macular degeneration Restrictions Weight Bearing Restrictions: No Pain: Pain Assessment Pain Assessment: No/denies pain Pain Score: 0-No pain  See Function Navigator for Current Functional Status.   Therapy/Group: Individual Therapy  Simonne Come 06/27/2015, 12:19 PM

## 2015-06-28 ENCOUNTER — Inpatient Hospital Stay (HOSPITAL_COMMUNITY): Payer: Medicare Other | Admitting: Occupational Therapy

## 2015-06-28 ENCOUNTER — Inpatient Hospital Stay (HOSPITAL_COMMUNITY): Payer: Medicare Other | Admitting: Physical Therapy

## 2015-06-28 NOTE — Progress Notes (Signed)
Occupational Therapy Session Note  Patient Details  Name: Andrew [name] MRN: [id number] Date of Birth: [birth year]  Today's Date: 06/28/2015 OT Individual Time: [id number] and [id number] OT Individual Time Calculation (min): 45 min and 47 minutes    Short Term Goals: Week 1:  OT Short Term Goal 1 (Week 1): Focus on LTGs due to short ELOS  Skilled Therapeutic Interventions/Progress Updates:    1) Treatment session with focus on self care skills, improved standing endurance, safety awareness, and seating safety/awareness. Pt lost balance twice in shower which was self corrected using grab bars. Pt lost balance just before sitting on toilet which he corrected using grab bars. Education provided to pt for using back of legs to feel for toilet and other seat then control descent to seated surface for increased safety. Pt verbalized understanding. Pt maintained standing posture for approximately 5 minutes before LLE tired and righting assistance provided. Left pt in room with all needs in reach.   2) Treatment session with focus on pt education and transfer of learning to home post d/c. Provided pt education regarding tub bench transfers. Pt indicates extensive construction anticipated to begin soon which will require using guest bathroom with tub shower instead of master bath with walk-in shower. Pt verbalized assent to getting tub bench and will check with daughter to whom he would like to have education provided regarding tub bench recommendation. Engaged pt in [place] activity commensurate with visual acuity. Activity provided to challenge standing endurance, BUE AROM, lateral tripod grasp, and L inattention. Left pt in room with needs in reach.   Therapy Documentation Precautions:  Precautions Precautions: Fall Precaution Comments: macular degeneration Restrictions Weight Bearing Restrictions: No General:   Vital Signs:  Pain: Pt with no c/o pain.  Pain Assessment Pain Assessment: No/denies  pain Pain Score: 0-No pain ADL:   Exercises:   Other Treatments:    See Function Navigator for Current Functional Status.   Therapy/Group: Individual Therapy  Dierdre Searles 06/28/2015, 12:09 PM

## 2015-06-28 NOTE — Progress Notes (Signed)
Physical Therapy Session Note  Patient Details  Name: Andrew [name] MRN: [id number] Date of Birth: 20-Oct-1923  Today's Date: 06/28/2015 PT Individual Time: 1100-1200 PT Individual Time Calculation (min): 60 min   Short Term Goals: Week 1:  PT Short Term Goal 1 (Week 1): STG=LTG due to ELOS.   Skilled Therapeutic Interventions/Progress Updates:    Pt received seated on toilet; denies pain and agreeable to treatment. Ambulation out of bathroom with RW and min guard. Standing at sink hand washing with S. Gait to/from gym 2x175' with RW and min guard faded to S. Stairs 2x12 with B handrails and min guard faded to S. First trial on 3" and 6" stairs with min guard, second trial on 6" stairs only. Reciprocal ascent, step-to descent. Gait with quad cane x90' with min/modA with repetitive mod verbal cues for upright posture. Standing alternating toe taps to 2" and 4" step for upright posture, dynamic balance and weight shifting. Gait to return to room with RW as above. Remained seated in recliner at completion of session, all needs within reach.   Therapy Documentation Precautions:  Precautions Precautions: Fall Precaution Comments: macular degeneration Restrictions Weight Bearing Restrictions: No Pain: Pain Assessment Pain Assessment: No/denies pain Pain Score: 0-No pain   See Function Navigator for Current Functional Status.   Therapy/Group: Individual Therapy  Luberta Mutter 06/28/2015, 12:05 PM

## 2015-06-28 NOTE — Progress Notes (Signed)
Inpatient Rehabilitation Center Individual Statement of Services  Patient Name:  Andrew [name]  Date:  06/28/2015  Welcome to the [facility].  Our goal is to provide you with an individualized program based on your diagnosis and situation, designed to meet your specific needs.  With this comprehensive rehabilitation program, you will be expected to participate in at least 3 hours of rehabilitation therapies Monday-Friday, with modified therapy programming on the weekends.  Your rehabilitation program will include the following services:  Physical Therapy (PT), Occupational Therapy (OT), 24 hour per day rehabilitation nursing, Neuropsychology, Case Management (Social Worker), Rehabilitation Medicine, Nutrition Services and Pharmacy Services  Weekly team conferences will be held on Wednesdays to discuss your progress.  Your Social Worker will talk with you frequently to get your input and to update you on team discussions.  Team conferences with you and your family in attendance may also be held.  Expected length of stay:  7 to 14 days  Overall anticipated outcome:  Supervision with modified independent  Depending on your progress and recovery, your program may change. Your Social Worker will coordinate services and will keep you informed of any changes. Your Social Worker's name and contact numbers are listed  below.  The following services may also be recommended but are not provided by the [facility] will be made to provide these services after discharge if needed.  Arrangements include referral to agencies that provide these services.  Your insurance has been verified to be:  [employer] Your primary doctor is:  Dr. Frederico Hamman Copland  Pertinent information will be shared with your doctor and your insurance company.  Social  Worker:  Alfonse Alpers, LCSW  [phone] or [phone]  Information discussed with and copy given to patient by: Trey Sailors, 06/28/2015, 10:14 AM

## 2015-06-28 NOTE — Progress Notes (Signed)
Social Work Assessment and Plan  Patient Details  Name: Andrew [name] MRN: [id number] Date of Birth: [birth year]  Today's Date: 06/27/2015  Problem List:  Patient Active Problem List   Diagnosis Date Noted  . Essential hypertension   . Dry eyes   . Left hemiparesis ([place]) 06/24/2015  . Alcohol abuse   . Benign essential HTN   . Coronary artery disease involving coronary bypass graft of native heart without angina pectoris   . Bradycardia   . Thrombocytopenia ([place])   . Right-sided cerebrovascular accident (CVA) ([place])   . CVA (cerebral infarction) 06/20/2015  . Stroke ([place]) 06/20/2015  . Macrocytosis without anemia 06/20/2015  . Hyperbilirubinemia 06/20/2015  . Cerebral infarction due to unspecified mechanism   . Chronic renal insufficiency, stage III (moderate) 06/02/2014  . Macular degeneration (senile) of retina 04/29/2014  . 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 Major depressive disorder, single episode 06/11/2013  . TIA (transient ischemic attack) 06/03/2013  . Pancreatic cyst 01/29/2013  . OSA (obstructive sleep apnea) 12/26/2012  . Chronic diastolic CHF (congestive heart failure) ([place]) 08/19/2012  . COPD, minimal-mild 12/22/2010  . S/P excision of acoustic neuroma 06/18/2010  . Pulmonary nodule 06/17/2010  . Vertigo 06/14/2010  . THYROID NODULE 03/13/2010  . PVD- moderate carotid disease 03/07/2010  . Spinal stenosis, lumbar region, with neurogenic claudication 07/13/2009  . ALLERGIC RHINITIS 09/01/2008  . COLONIC POLYPS, HX OF 09/01/2008  . HYPERCHOLESTEROLEMIA 06/18/2008  . Angina, class II ([place]) 06/18/2008  . Hx of CABG 06/18/2008  . Moderate aortic stenosis 06/18/2008  . GASTROESOPHAGEAL REFLUX DISEASE 06/18/2008   Past Medical History:  Past Medical History  Diagnosis Date  . HYPERCHOLESTEROLEMIA   . CAD, ARTERY BYPASS GRAFT   . CAROTID ARTERY STENOSIS 03/07/2010    80%  . AORTIC STENOSIS   . GASTROESOPHAGEAL REFLUX DISEASE   . Skin cancer   . History of colonic  polyps     1999, 2004  . Allergic rhinitis   . Thyroid nodule 05/2010    Abnormal biopsy, 82 year old patient and his wife have made informed decision to not pursue surgical resection. Potential risk including cancer has been thoroughly discussed with the patient.  . S/P excision of acoustic neuroma   . History of prostate cancer 2002    s/p treatment with seeds / radiation  . Lumbar spinal stenosis 07/13/2009  . COPD, mild ([place]) 12/22/2010  . Pancreatic cyst 01/29/2013    Noted on MRI scan from [facility] on 07/27/2011.  I reviewed report at patient request on 01-29-2013.  "Small cystic focus in the posterior pancreatic head.  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  Follow up MRI is advised. The main pancreatic duct remains normal in appearance."  Patient do  . CHF (congestive heart failure) ([place])   . Macular degeneration (senile) of retina 04/29/2014   Past Surgical History:  Past Surgical History  Procedure Laterality Date  . Coronary artery bypass graft    . Transperineal implatation of palladium    . Cholecystectomy    . Shoulder surgery    . Total knee arthroplasty    . Cataract extraction    . Tonsillectomy and adenoidectomy  1932  . Craniectomy for excision of acoustic neuroma  1985  . Cataract extraction  2010  . Eye surgery     Social History:  reports that he quit smoking about 54 years ago. His smoking use included Cigarettes.  He has a 40 pack-year smoking history. He has never used smokeless tobacco. He reports that he drinks about 4.2 oz of alcohol per week. He reports that he does not use illicit drugs.  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   Abuse/Neglect Physical Abuse: Denies Verbal Abuse: Denies Sexual Abuse: Denies Exploitation of patient/patient's resources: Denies Self-Neglect: Denies  Emotional Status Pt's affect, behavior and adjustment status: Pt admits that his current situation is "depressing", but overall he is feeling okay emotionally.  He is grieving the loss of some things he will not be able to do for a while, if at all. Recent Psychosocial Issues: Pt's dtr who lives in [place] [facility]) is going through treatment for stage 4 cancer. Psychiatric History: none reported Substance Abuse History: none reported  Patient / Family Perceptions, Expectations & Goals Pt/Family understanding of illness & functional limitations: Pt/wife have a good understanding of pt's condition and have had their questions answered by the medical team. Premorbid pt/family roles/activities: Pt enjoyed traveling right after retirment, but now enjoys family time and being outside. Anticipated changes in roles/activities/participation: Pt has tools in his garage that he feels he will not be able to use anymore.  This makes him sad and he is thinking of selling them. Pt/family expectations/goals: Pt wants to relearn how to do things and be as independent as possible.  Community [employer] Agencies: None Premorbid Home Care/DME Agencies: None (Pt has a rolling walker and grab bars.  Will get a handheld  shower sprayer and can borrow a BSC.  Can reach both handrails to climb stairs to enter home.) Transportation available at discharge: wife Resource referrals recommended: Neuropsychology, Support group (specify)  Discharge Planning Living Arrangements: Spouse/significant other (Dtr (from [place]) to come stay with pt/wife for a little while after pt's d/c.) Support Systems: Spouse/significant other, Children, Other relatives, Friends/neighbors Type of Residence: Private residence Insurance Resources: Multimedia programmer (specify) (Marine scientist) Pensions consultant: Fish farm manager, Other (Comment) (retirement) Museum/gallery curator Screen Referred: No Money Management: Patient, Spouse Does the patient have any problems obtaining your medications?: No Home Management: Pt's wife can manage this. Patient/Family Preliminary Plans: Pt plans to return home with his wife and dtr to provide 24/7 supervision. Barriers to Discharge: Steps (two handrails that he can reach at the same time) Social Work Anticipated Follow Up Needs: HH/OP, Support Group Expected length of stay: 7 to 14 days  Clinical Impression CSW met with pt to introduce self and role of CSW, as well as to complete assessment.  Pt's wife came in during our visit and CSW did the same for her.  She was pleased to know of team conference and looks forward to an update on Wednesday.  Wife plans for pt to come home after Rehab and will have her dtr from [facility], [place] staying with them for a little while.  She is in good health and can provide supervision to  pt.  Pt admits to the situation being "depressing", but reports overall feeling okay emotionally.  Pt was very talkative with CSW, which wife said is different for him, and he laughed and smiled and gave good eye contact.  Both he and wife are grateful that stroke wasn't worse and that he can come to CIR for rehab.  CSW issued wife a parking pass for closer access to the unit.  No other  concerns/questions/needs at this time.  CSW will continue to follow and assist as needed.  Solita Macadam, Silvestre Mesi 06/27/2015, 1:30 PM

## 2015-06-28 NOTE — Progress Notes (Signed)
[place] PHYSICAL MEDICINE & REHABILITATION     PROGRESS NOTE  Subjective/Complaints:   Pt c/o urinary freq but no dysuria  ROS: + Chronic dry eyes. Denies CP, SOB, nausea, vomiting, diarrhea.  Objective: Vital Signs: Blood pressure 163/63, pulse 71, temperature 98.3 F (36.8 C), temperature source Oral, resp. rate 17, height 5\' 10"  (1.778 m), weight 84.5 kg (186 lb 4.6 oz), SpO2 98 %. No results found.  Recent Labs  06/27/15 0630  WBC 7.1  HGB 13.9  HCT 41.8  PLT 110*    Recent Labs  06/27/15 0630  NA 138  K 3.8  CL 106  GLUCOSE 95  BUN 29*  CREATININE 1.61*  CALCIUM 8.8*   CBG (last 3)  No results for input(s): GLUCAP in the last 72 hours.  Wt Readings from Last 3 Encounters:  06/28/15 84.5 kg (186 lb 4.6 oz)  06/20/15 88.451 kg (195 lb)  05/02/15 88.792 kg (195 lb 12 oz)    Physical Exam:  BP 163/63 mmHg  Pulse 71  Temp(Src) 98.3 F (36.8 C) (Oral)  Resp 17  Ht 5\' 10"  (1.778 m)  Wt 84.5 kg (186 lb 4.6 oz)  BMI 26.73 kg/m2  SpO2 98% Constitutional: He appears well-developed and well-nourished. NAD. HENT: fair dentition, oral mucosa pink and moist Head: Normocephalic and atraumatic.  Eyes: Conjunctivae and EOM are normal.  Cardiovascular: Normal rate and regular rhythm.  Respiratory: Effort normal and breath sounds normal. No respiratory distress.  GI: Soft. Bowel sounds are normal. He exhibits no distension.  Musculoskeletal: He exhibits no edema or tenderness.  Neurological: He is alert and oriented.  Patient is hard of hearing.  Left facial weakness (baseline) Motor: B/l UE 4+/5 delt,bic,tric,wrist,hand RLE: 4+/5 proximal to distal  LLE: Hip flexion 3+/5, knee extension 4/5, ankle dorsi/plantar flexion 4+/5  Skin: Skin is warm and dry.  Psychiatric: He has a normal mood and affect. His behavior is normal  Assessment/Plan: 1. Functional deficits secondary to nonhemorrhagic infarct posterior limb right internal capsule which require  3+ hours per day of interdisciplinary therapy in a comprehensive inpatient rehab setting. Physiatrist is providing close team supervision and 24 hour management of active medical problems listed below. Physiatrist and rehab team continue to assess barriers to discharge/monitor patient progress toward functional and medical goals.  Function:  Bathing Bathing position   Position: Shower  Bathing parts Body parts bathed by patient: Right arm, Left arm, Chest, Abdomen, Front perineal area, Buttocks, Right upper leg, Left upper leg, Right lower leg, Left lower leg Body parts bathed by helper: Back, Left lower leg, Right lower leg  Bathing assist Assist Level: Touching or steadying assistance(Pt > 75%)      Upper Body Dressing/Undressing Upper body dressing   What is the patient wearing?: Pull over shirt/dress     Pull over shirt/dress - Perfomed by patient: Thread/unthread right sleeve, Thread/unthread left sleeve, Put head through opening, Pull shirt over trunk          Upper body assist Assist Level: Set up   Set up : To obtain clothing/put away  Lower Body Dressing/Undressing Lower body dressing   What is the patient wearing?: Pants, Underwear, Socks, Shoes Underwear - Performed by patient: Thread/unthread right underwear leg, Thread/unthread left underwear leg, Pull underwear up/down   Pants- Performed by patient: Thread/unthread right pants leg, Thread/unthread left pants leg, Pull pants up/down, Fasten/unfasten pants   Non-skid slipper socks- Performed by patient: Don/doff right sock, Don/doff left sock Non-skid slipper socks- Performed by  helper: Don/doff right sock, Don/doff left sock Socks - Performed by patient: Don/doff right sock, Don/doff left sock   Shoes - Performed by patient: Don/doff right shoe, Don/doff left shoe, Fasten right, Fasten left            Lower body assist Assist for lower body dressing: Touching or steadying assistance (Pt > 75%)       Toileting Toileting   Toileting steps completed by patient: Adjust clothing prior to toileting, Performs perineal hygiene, Adjust clothing after toileting Toileting steps completed by helper: Adjust clothing after toileting Toileting Assistive Devices: Grab bar or rail  Toileting assist Assist level: Supervision or verbal cues   Transfers Chair/bed transfer   Chair/bed transfer method: Stand pivot Chair/bed transfer assist level: Touching or steadying assistance (Pt > 75%) Chair/bed transfer assistive device: Medical sales representative     Max distance: 20 Assist level: Touching or steadying assistance (Pt > 75%)   Wheelchair   Type: Manual Max wheelchair distance: 196ft Assist Level: Supervision or verbal cues  Cognition Comprehension Comprehension assist level: Follows complex conversation/direction with extra time/assistive device  Expression Expression assist level: Expresses complex 90% of the time/cues < 10% of the time  Social Interaction Social Interaction assist level: Interacts appropriately with others - No medications needed.  Problem Solving Problem solving assist level: Solves basic 90% of the time/requires cueing < 10% of the time  Memory Memory assist level: Recognizes or recalls 50 - 74% of the time/requires cueing 25 - 49% of the time    Medical Problem List and Plan: 1. Left hemiparesis secondary to nonhemorrhagic infarct posterior limb right internal capsule  Continue CIR, PT, OT, team conference to set D/C im am 2. DVT Prophylaxis/Anticoagulation: SCDs. Monitor for any signs of DVT 3. Pain Management: Tylenol as needed 4. Mood: Prozac 10 mg daily 5. Neuropsych: This patient is capable of making decisions on his own behalf. 6. Skin/Wound Care: Routine skin checks 7. Fluids/Electrolytes/Nutrition: Routine I&O with follow-up chemistries, 100%meals, 823ml fluids 8. Diastolic congestive heart failure. Lasix 40 mg daily. No signs of fluid overload.  Weight stable at 84 kg last 3 days 9. Hypertension. Allow permissive hypertension. Patient on Imdur 90 mg daily, Toprol 25 mg daily prior to admission. Resume as needed  elevated within last 24 hours, if this persists will consider increasing medications Filed Vitals:   06/27/15 1630 06/28/15 0526  BP: 130/58 163/63  Pulse: 66 71  Temp: 98 F (36.7 C) 98.3 F (36.8 C)  Resp: 18 17   10. CAD status post CABG. Continue aspirin 11. Hyperlipidemia. Pravachol 12. History of alcohol use. Patient consumes approximately 4.2 ounces of alcohol per week. Monitor and provide counseling, do not anticipate withdrawal with reported usage 13. Dry eyes: Patient to bring drops from home. 14.  Urinary incont- check UA C and S LOS (Days) 4 A FACE TO FACE EVALUATION WAS PERFORMED  Alysia Penna E 06/28/2015 7:19 AM

## 2015-06-28 NOTE — Progress Notes (Signed)
Physical Therapy Session Note  Patient Details  Name: BONNER JANI MRN: [id number] Date of Birth: [birth year]  Today's Date: 06/28/2015 PT Individual Time: 1100-1200 PT Individual Time Calculation (min): 60 min   Short Term Goals: Week 1:  PT Short Term Goal 1 (Week 1): STG=LTG due to ELOS.   Skilled Therapeutic Interventions/Progress Updates:    Pt received seated in recliner, denies pain and agreeable to treatment. Gait into/out of bathroom with RW and close S. Min cues for upright posture while turning toward toilet due to tendency to let RW get too far away from him. Performs clothing management and hygiene with S. Gait to/from gym with RW and S 2x150'. Standing balance on small red and larger blue wedge and balance foam for retraining ankle/hip strategy due to consistent LOBs posteriorly and poor activation of closed chain ankle dorsiflexors. Seated LE strengthening exercises 1 set 10 reps each, and pt given handout of LE exercises to perform independently outside of regular therapy sessions. Pt able to demonstrate exercises using pictures with minimal cueing for technique. Large print added to each page to cue pt and assist with recall of each exercise.  Pt remained seated in recliner at completion of session, all needs within reach.   Therapy Documentation Precautions:  Precautions Precautions: Fall Precaution Comments: macular degeneration Restrictions Weight Bearing Restrictions: No Pain: Pain Assessment Pain Assessment: No/denies pain Pain Score: 0-No pain   See Function Navigator for Current Functional Status.   Therapy/Group: Individual Therapy  Luberta Mutter 06/28/2015, 3:04 PM

## 2015-06-29 ENCOUNTER — Inpatient Hospital Stay (HOSPITAL_COMMUNITY): Payer: Medicare Other | Admitting: Occupational Therapy

## 2015-06-29 ENCOUNTER — Inpatient Hospital Stay (HOSPITAL_COMMUNITY): Payer: Medicare Other

## 2015-06-29 ENCOUNTER — Inpatient Hospital Stay (HOSPITAL_COMMUNITY): Payer: Medicare Other | Admitting: Physical Therapy

## 2015-06-29 LAB — URINALYSIS, ROUTINE W REFLEX MICROSCOPIC
BILIRUBIN URINE: NEGATIVE
Glucose, UA: NEGATIVE mg/dL
Hgb urine dipstick: NEGATIVE
Ketones, ur: NEGATIVE mg/dL
Leukocytes, UA: NEGATIVE
Nitrite: NEGATIVE
Protein, ur: NEGATIVE mg/dL
SPECIFIC GRAVITY, URINE: 1.014 (ref 1.005–1.030)
pH: 5.5 (ref 5.0–8.0)

## 2015-06-29 NOTE — Progress Notes (Signed)
Physical Therapy Note  Patient Details  Name: Andrew [name] MRN: [id number] Date of Birth: 12/19/1923 Today's Date: 06/29/2015  0915-1000, 45 min; 1445-1530, 45 min Pain: none at rest AM or PM  Tx 1:  Discussed use of urinal at night sitting EOB, as pt routinely gets up to use urinate 2-3x/night at home, if he takes his diuretic in early AM. Pt stated he has had angina several times while here in CIR, but has not notified anyone.  He has Nitroglycerin spray he uses at home for this.  RN informed.   Pt donned socks and shoes in sitting.   "Warm up" neuromuscular re-education via VCs for seated long arc quad knee ext, calf raises, glut sets, marching, 15 x 1 each.  Gait to/from gym with RW.  Balance retraining standing on wedge for prolonged heel cord stretch, and activation of hip flexors for balance strategy.  Pt appears to have L pelvic crest higher than L, and stated previous shoes modified by his sports med [facility] had built up innersole in 1 shoe.  Pt supplied with heel lift in R shoe.  Gait to return to room with better clearance of L foot.  Pt left resting in recliner and all needs within reach.  tx 2:  Patient demonstrates increased fall risk as noted by score of  25/56 on Berg Balance Scale.  (<36= high risk for falls, close to 100%; 37-45 significant >80%; 46-51 moderate >50%; 52-55 lower >25%) . Implications of score discussed with pt.  Pt tolerating R heel lift for gait.  Neuro re-ed for gait without AD to return to room, mod assist for wt shifting, LOB L during turns L. Pt left resting in recliner with all needs within reach.  Darene Nappi 06/29/2015, 7:53 AM

## 2015-06-29 NOTE — Plan of Care (Signed)
Problem: RH Bathing Goal: LTG Patient will bathe with assist, cues/equipment (OT) LTG: Patient will bathe specified number of body parts with assist with/without cues using equipment (position) (OT)  Downgraded due to visual impairments  Problem: RH Tub/Shower Transfers Goal: LTG Patient will perform tub/shower transfers w/assist (OT) LTG: Patient will perform tub/shower transfers with assist, with/without cues using equipment (OT)  Downgraded due to impaired vision and change in bathroom setup

## 2015-06-29 NOTE — Consult Note (Signed)
   [facility] CM Inpatient Consult   06/29/2015  Andrew [name] Nov 03, 1923 [id number]  Referral was received from inpatient CSW.  Patient is eligible for [facility] Care Management services as a beneficiary of the [facility] registry.  Will follow up with the patient for post rehab care management needs.  For questions, please contact:  Natividad Brood, RN BSN [facility] Liaison  [phone] business mobile phone Toll free office [phone]

## 2015-06-29 NOTE — Progress Notes (Signed)
Occupational Therapy Session Note  Patient Details  Name: Andrew [name] MRN: [id number] Date of Birth: August 29, 1923  Today's Date: 06/29/2015 OT Individual Time: [id number] and [id number] OT Individual Time Calculation (min): 45 min and 47 minutes     Short Term Goals: Week 1:  OT Short Term Goal 1 (Week 1): Focus on LTGs due to short ELOS  Skilled Therapeutic Interventions/Progress Updates:    1) Treatment session with focus on posture while ambulating, use of BUE, memory challenge, and standing endurance for improved performance in occupations such as self care. Pt completed oral care and hair combing at standing without need for CGA. Continue with energy conservation with pt suggestion to complete activities performed at standing prior to completing shower. Pt completed BUE activity to challenge standing posture with focus on reducing ventral drag in trunk and to challenge dependence upon tabletop and other surfaces for maintaining balance while in upright standing posture. Memory challenge incorporated in activity by having pt recall order of activity when using one UE after the other. Pt completed memory challenge with min verbal cues for recall. Standing endurance noted for 7 then 4 minutes. Provided pt education regarding means of evaluating rest requirement such as using watch, clock, or paying attention to remaining energy levels. Pt verbalized understanding.  2) Treatment session with focus on d/c planning, night time toileting options, and standing endurance. Pt indicated that construction on home will preclude use of master bathroom and guest bathroom is down the hall, requiring a walk  For day and night toileting. Discussed use of BSC for night time toileting to which pt agreed. Placed BSC in room and demonstrated transfers to pt for increased success when toileting at night given lack of success with urinal. Recommendation for pt to utilize [facility] overnight with nursing supervision to determine  efficacy of BSC at home. Pt completed BUE activity to challenge standing endurance for increased success in self care occupations and required use of tabletop to maintain balance to reduce amount of force used on RW. Pt remains standing up to 7 minutes prior to need for seated break and between 1 and 2 minutes before using tabletop for maintaining balance. Continue to challenge standing balance with BUE activity for challenge, provide more pt education on energy conservation strategies to ensure recall and application for improved safety.   Therapy Documentation Precautions:  Precautions Precautions: Fall Precaution Comments: macular degeneration Restrictions Weight Bearing Restrictions: No General:   Vital Signs:   Pain:   Pt with no c/o pain.   See Function Navigator for Current Functional Status.   Therapy/Group: Individual Therapy  Dierdre Searles 06/29/2015, 12:31 PM

## 2015-06-29 NOTE — Progress Notes (Signed)
[facility] [facility] PHYSICAL MEDICINE & REHABILITATION     PROGRESS NOTE  Subjective/Complaints:   No issues overnite. Wants to stay as long as he needs to here  ROS: + Chronic dry eyes. Denies CP, SOB, nausea, vomiting, diarrhea.  Objective: Vital Signs: Blood pressure 133/77, pulse 65, temperature 97.8 F (36.6 C), temperature source Oral, resp. rate 18, height _0  (1.778 m), weight 85.2 kg (187 lb 13.3 oz), SpO2 96 %. No results found.  Recent Labs  06/27/15 0630  WBC 7.1  HGB 13.9  HCT 41.8  PLT 110*    Recent Labs  06/27/15 0630  NA 138  K 3.8  CL 106  GLUCOSE 95  BUN 29*  CREATININE 1.61*  CALCIUM 8.8*   CBG (last 3)  No results for input(s): GLUCAP in the last 72 hours.  Wt Readings from Last 3 Encounters:  06/29/15 85.2 kg (187 lb 13.3 oz)  06/20/15 88.451 kg (195 lb)  05/02/15 88.792 kg (195 lb 12 oz)    Physical Exam:  BP 133/77 mmHg  Pulse 65  Temp(Src) 97.8 F (36.6 C) (Oral)  Resp 18  Ht _1  (1.778 m)  Wt 85.2 kg (187 lb 13.3 oz)  BMI 26.95 kg/m2  SpO2 96% Constitutional: He appears well-developed and well-nourished. NAD. HENT: fair dentition, oral mucosa pink and moist Head: Normocephalic and atraumatic.  Eyes: Conjunctivae and EOM are normal.  Cardiovascular: Normal rate and regular rhythm.  Respiratory: Effort normal and breath sounds normal. No respiratory distress.  GI: Soft. Bowel sounds are normal. He exhibits no distension.  Musculoskeletal: He exhibits no edema or tenderness.  Neurological: He is alert and oriented.  Patient is hard of hearing.  Left facial weakness (baseline) Motor: B/l UE 4+/5 delt,bic,tric,wrist,hand RLE: 4+/5 proximal to distal  LLE: Hip flexion 3+/5, knee extension 4/5, ankle dorsi/plantar flexion 4+/5  Skin: Skin is warm and dry.  Psychiatric: He has a normal mood and affect. His behavior is normal  Assessment/Plan: 1. Functional deficits secondary to nonhemorrhagic infarct posterior limb right  internal capsule which require 3+ hours per day of interdisciplinary therapy in a comprehensive inpatient rehab setting. Physiatrist is providing close team supervision and 24 hour management of active medical problems listed below. Physiatrist and rehab team continue to assess barriers to discharge/monitor patient progress toward functional and medical goals.  Function:  Bathing Bathing position   Position: Shower  Bathing parts Body parts bathed by patient: Right arm, Left arm, Chest, Abdomen, Front perineal area, Buttocks, Right upper leg, Left upper leg, Right lower leg, Left lower leg Body parts bathed by helper: Back, Left lower leg, Right lower leg  Bathing assist Assist Level: Touching or steadying assistance(Pt > 75%)      Upper Body Dressing/Undressing Upper body dressing   What is the patient wearing?: Pull over shirt/dress     Pull over shirt/dress - Perfomed by patient: Thread/unthread right sleeve, Thread/unthread left sleeve, Put head through opening, Pull shirt over trunk          Upper body assist Assist Level: Set up   Set up : To obtain clothing/put away  Lower Body Dressing/Undressing Lower body dressing   What is the patient wearing?: Pants, Non-skid slipper socks Underwear - Performed by patient: Thread/unthread right underwear leg, Thread/unthread left underwear leg, Pull underwear up/down   Pants- Performed by patient: Thread/unthread right pants leg, Thread/unthread left pants leg, Pull pants up/down   Non-skid slipper socks- Performed by patient: Don/doff right sock, Don/doff left sock Non-skid  slipper socks- Performed by helper: Don/doff right sock, Don/doff left sock Socks - Performed by patient: Don/doff right sock, Don/doff left sock   Shoes - Performed by patient: Don/doff right shoe, Don/doff left shoe, Fasten right, Fasten left            Lower body assist Assist for lower body dressing: Touching or steadying assistance (Pt > 75%)       Toileting Toileting   Toileting steps completed by patient: Adjust clothing prior to toileting, Performs perineal hygiene, Adjust clothing after toileting Toileting steps completed by helper: Adjust clothing after toileting Toileting Assistive Devices: Grab bar or rail  Toileting assist Assist level: Touching or steadying assistance (Pt.75%)   Transfers Chair/bed transfer   Chair/bed transfer method: Stand pivot Chair/bed transfer assist level: Touching or steadying assistance (Pt > 75%) Chair/bed transfer assistive device: Medical sales representative     Max distance: 150 Assist level: Supervision or verbal cues   Wheelchair   Type: Manual Max wheelchair distance: 149f Assist Level: Supervision or verbal cues  Cognition Comprehension Comprehension assist level: Follows complex conversation/direction with extra time/assistive device  Expression Expression assist level: Expresses complex 90% of the time/cues < 10% of the time  Social Interaction Social Interaction assist level: Interacts appropriately with others - No medications needed.  Problem Solving Problem solving assist level: Solves complex 90% of the time/cues < 10% of the time  Memory Memory assist level: More than reasonable amount of time    Medical Problem List and Plan: 1. Left hemiparesis secondary to nonhemorrhagic infarct posterior limb right internal capsule Team conference today please see physician documentation under team conference tab, met with team face-to-face to discuss problems,progress, and goals. Formulized individual treatment plan based on medical history, underlying problem and comorbidities. 2. DVT Prophylaxis/Anticoagulation: SCDs. Monitor for any signs of DVT 3. Pain Management: Tylenol as needed 4. Mood: Prozac 10 mg daily 5. Neuropsych: This patient is capable of making decisions on his own behalf. 6. Skin/Wound Care: Routine skin checks 7. Fluids/Electrolytes/Nutrition: Routine  I&O with follow-up chemistries, 100%meals, 6072mfluids 8. Diastolic congestive heart failure. Lasix 40 mg daily. No signs of fluid overload. Weight up to85kg but clinically looks good, no change in dose 9. Hypertension. Allow permissive hypertension. Patient on Imdur 90 mg daily, Toprol 25 mg daily prior to admission. Resume as needed  elevated within last 24 hours, if this persists will consider increasing medications Filed Vitals:   06/28/15 1351 06/29/15 0458  BP: 130/88 133/77  Pulse: 71 65  Temp: 98.4 F (36.9 C) 97.8 F (36.6 C)  Resp: 16 18   10. CAD status post CABG. Continue aspirin 11. Hyperlipidemia. Pravachol 12. History of alcohol use. Patient consumes approximately 4.2 ounces of alcohol per week. Monitor and provide counseling, do not anticipate withdrawal with reported usage 13. Dry eyes: Patient to bring drops from home. 14.  Urinary incont- check UA C and S, if neg consider anticholinergic low dose LOS (Days) 5 A FACE TO FACE EVALUATION WAS PERFORMED  KICharlett Blake/07/2015 6:41 AM

## 2015-06-29 NOTE — Progress Notes (Signed)
Physical Therapy Session Note  Patient Details  Name: Andrew [name] MRN: [id number] Date of Birth: [birth year]  Today's Date: 06/29/2015 PT Individual Time: 1130-1200 PT Individual Time Calculation (min): 30 min   Short Term Goals: Week 1:  PT Short Term Goal 1 (Week 1): STG=LTG due to ELOS.   Skilled Therapeutic Interventions/Progress Updates:    Pt received seated in recliner, denies pain and agreeable to treatment. Family expressed concern regarding too short of rest breaks and pt not having enough time to process feedback from previous session. Gait to/from gym 2x150' with RW and S. Stairs ascent/descent 1x8 with B handrails and S. Gait over ramp, uneven surface and curb step with RW and S. Gait navigating around cones x45' with S; occasional min cues for upright posture and L foot clearance. Returned to room as above; remained seated in recliner at completion of session all needs within reach. Family educated on [employer], goals of rehab for safe d/c home, with Merrilee Jansky used to indicate fall risk and identify need for AD for safe ambulation rather than used as a basis for determining d/c preparedness.   Therapy Documentation Precautions:  Precautions Precautions: Fall Precaution Comments: macular degeneration Restrictions Weight Bearing Restrictions: No Pain: Pain Assessment Pain Assessment: No/denies pain   See Function Navigator for Current Functional Status.   Therapy/Group: Individual Therapy  Luberta Mutter 06/29/2015, 12:10 PM

## 2015-06-30 ENCOUNTER — Inpatient Hospital Stay (HOSPITAL_COMMUNITY): Payer: Medicare Other | Admitting: Occupational Therapy

## 2015-06-30 ENCOUNTER — Inpatient Hospital Stay (HOSPITAL_COMMUNITY): Payer: Medicare Other | Admitting: Physical Therapy

## 2015-06-30 LAB — URINE CULTURE

## 2015-06-30 MED ORDER — ISOSORBIDE MONONITRATE ER 30 MG PO TB24
60.0000 mg | ORAL_TABLET | Freq: Every day | ORAL | Status: DC
  Administered 2015-06-30 – 2015-07-02 (×3): 60 mg via ORAL
  Filled 2015-06-30 (×3): qty 2

## 2015-06-30 MED ORDER — MIRABEGRON ER 25 MG PO TB24
25.0000 mg | ORAL_TABLET | Freq: Every day | ORAL | Status: DC
  Administered 2015-06-30 – 2015-07-02 (×3): 25 mg via ORAL
  Filled 2015-06-30 (×3): qty 1

## 2015-06-30 NOTE — Discharge Summary (Signed)
Discharge summary job # [id number]

## 2015-06-30 NOTE — Progress Notes (Signed)
[place] PHYSICAL MEDICINE & REHABILITATION     PROGRESS NOTE  Subjective/Complaints:   No issues overnite. Wants to stay as long as he needs to here  ROS: + Chronic dry eyes. Denies CP, SOB, nausea, vomiting, diarrhea.  Objective: Vital Signs: Blood pressure 155/102, pulse 77, temperature 97.4 F (36.3 C), temperature source Oral, resp. rate 18, height 5\' 10"  (1.778 m), weight 80.2 kg (176 lb 12.9 oz), SpO2 96 %. No results found. No results for input(s): WBC, HGB, HCT, PLT in the last 72 hours. No results for input(s): NA, K, CL, GLUCOSE, BUN, CREATININE, CALCIUM in the last 72 hours.  Invalid input(s): CO CBG (last 3)  No results for input(s): GLUCAP in the last 72 hours.  Wt Readings from Last 3 Encounters:  06/30/15 80.2 kg (176 lb 12.9 oz)  06/20/15 88.451 kg (195 lb)  05/02/15 88.792 kg (195 lb 12 oz)    Physical Exam:  BP 155/102 mmHg  Pulse 77  Temp(Src) 97.4 F (36.3 C) (Oral)  Resp 18  Ht 5\' 10"  (1.778 m)  Wt 80.2 kg (176 lb 12.9 oz)  BMI 25.37 kg/m2  SpO2 96% Constitutional: He appears well-developed and well-nourished. NAD. HENT: fair dentition, oral mucosa pink and moist Head: Normocephalic and atraumatic.  Eyes: Conjunctivae and EOM are normal.  Cardiovascular: Normal rate and regular rhythm.  Respiratory: Effort normal and breath sounds normal. No respiratory distress.  GI: Soft. Bowel sounds are normal. He exhibits no distension.  Musculoskeletal: He exhibits no edema or tenderness.  Neurological: He is alert and oriented.  Patient is hard of hearing.  Left facial weakness (baseline) Motor: B/l UE 4+/5 delt,bic,tric,wrist,hand RLE: 4+/5 proximal to distal  LLE: Hip flexion 3+/5, knee extension 4/5, ankle dorsi/plantar flexion 4+/5  Skin: Skin is warm and dry.  Psychiatric: He has a normal mood and affect. His behavior is normal  Assessment/Plan: 1. Functional deficits secondary to nonhemorrhagic infarct posterior limb right internal  capsule which require 3+ hours per day of interdisciplinary therapy in a comprehensive inpatient rehab setting. Physiatrist is providing close team supervision and 24 hour management of active medical problems listed below. Physiatrist and rehab team continue to assess barriers to discharge/monitor patient progress toward functional and medical goals.  Function:  Bathing Bathing position Bathing activity did not occur: Refused Position: Production manager parts bathed by patient: Right arm, Left arm, Chest, Abdomen, Front perineal area, Buttocks, Right upper leg, Left upper leg, Right lower leg, Left lower leg Body parts bathed by helper: Back, Left lower leg, Right lower leg  Bathing assist Assist Level: Touching or steadying assistance(Pt > 75%)      Upper Body Dressing/Undressing Upper body dressing Upper body dressing/undressing activity did not occur: Refused What is the patient wearing?: Pull over shirt/dress     Pull over shirt/dress - Perfomed by patient: Thread/unthread right sleeve, Thread/unthread left sleeve, Put head through opening, Pull shirt over trunk          Upper body assist Assist Level: Set up   Set up : To obtain clothing/put away  Lower Body Dressing/Undressing Lower body dressing Lower body dressing/undressing activity did not occur: Refused What is the patient wearing?: Pants, Non-skid slipper socks Underwear - Performed by patient: Thread/unthread right underwear leg, Thread/unthread left underwear leg, Pull underwear up/down   Pants- Performed by patient: Thread/unthread right pants leg, Thread/unthread left pants leg, Pull pants up/down   Non-skid slipper socks- Performed by patient: Don/doff right sock, Don/doff left sock Non-skid  slipper socks- Performed by helper: Don/doff right sock, Don/doff left sock Socks - Performed by patient: Don/doff right sock, Don/doff left sock   Shoes - Performed by patient: Don/doff right shoe, Don/doff left  shoe, Fasten right, Fasten left            Lower body assist Assist for lower body dressing: Touching or steadying assistance (Pt > 75%)      Toileting Toileting   Toileting steps completed by patient: Adjust clothing prior to toileting, Performs perineal hygiene, Adjust clothing after toileting Toileting steps completed by helper: Adjust clothing after toileting Toileting Assistive Devices: Grab bar or rail  Toileting assist Assist level: Touching or steadying assistance (Pt.75%)   Transfers Chair/bed transfer   Chair/bed transfer method: Stand pivot Chair/bed transfer assist level: Supervision or verbal cues Chair/bed transfer assistive device: Medical sales representative     Max distance: 200 Assist level: Moderate assist (Pt 50 - 74%)   Wheelchair   Type: Manual Max wheelchair distance: 137ft Assist Level: Supervision or verbal cues  Cognition Comprehension Comprehension assist level: Follows complex conversation/direction with extra time/assistive device  Expression Expression assist level: Expresses complex 90% of the time/cues < 10% of the time  Social Interaction Social Interaction assist level: Interacts appropriately with others - No medications needed.  Problem Solving Problem solving assist level: Solves basic 90% of the time/requires cueing < 10% of the time  Memory Memory assist level: More than reasonable amount of time    Medical Problem List and Plan: 1. Left hemiparesis secondary to nonhemorrhagic infarct posterior limb right internal capsule Team conference discussed with family, will be at mod I level by 6/10, pt still feels a little dizzy which is most likely CVA related 2. DVT Prophylaxis/Anticoagulation: SCDs. Monitor for any signs of DVT 3. Pain Management: Tylenol as needed 4. Mood: Prozac 10 mg daily 5. Neuropsych: This patient is capable of making decisions on his own behalf. 6. Skin/Wound Care: Routine skin checks 7.  Fluids/Electrolytes/Nutrition: Routine I&O with follow-up chemistries, 100%meals, 813ml fluids 8. Diastolic congestive heart failure. Lasix 40 mg daily. No signs of fluid overload. Weight 80kg, question accuracy given divergence from other recent values 9. Hypertension. Allow permissive hypertension. Patient on Imdur 90 mg daily, Toprol 25 mg daily prior to admission. Resume as needed  elevated within last 24 hours, if this persists will consider increasing medications Filed Vitals:   06/29/15 1539 06/30/15 0442  BP: 120/61 155/102  Pulse: 64 77  Temp: 98 F (36.7 C) 97.4 F (36.3 C)  Resp: 16 18   10. CAD status post CABG. Continue aspirin 11. Hyperlipidemia. Pravachol 12. History of alcohol use. Patient consumes approximately 4.2 ounces of alcohol per week.  do not anticipate withdrawal with reported usage 13. Dry eyes: Patient to bring drops from home. 14.  Urinary incont- UA neg, will start low dose myrbetriq LOS (Days) 6 A FACE TO FACE EVALUATION WAS PERFORMED  Daronte Shostak E 06/30/2015 7:25 AM

## 2015-06-30 NOTE — Progress Notes (Signed)
Physical Therapy Session Note  Patient Details  Name: Andrew [name] MRN: [id number] Date of Birth: February 14, 1923  Today's Date: 06/30/2015 PT Individual Time: 1101-1200 AND 1630-1710 PT Individual Time Calculation (min): 59 min AND 40 min   Short Term Goals: Week 1:  PT Short Term Goal 1 (Week 1): STG=LTG due to ELOS.   Skilled Therapeutic Interventions/Progress Updates:  Session 1   Patienr received supine in bed and agreeable to PT. Supine>sit transfer with out cues from PT but slight use of bed features. Stand pivot transfer to Recliner from bed with min A form PT and no AD. Patient donned shoes sitting in recliner without cues or assist from PT  Gait training for 117ft x2 with RW and supervision A from PT. Min cues for improved LLE step length and increased hip/knee flexion to prevent foot drag.  Stairs with supervision A x 8 steps with bilateral UE support on rails. Min cues for proper step to gait pattern leading with the R LE on ascent. Patient self selected step over step gait pattern for descent with improved stability and control compared to previous sessions.   [place] level A exercises performed x 10 BLE with 2lb ankle weight for increased resistance and increased proprioception in the LLE. Exercises including LAQ, sit<>stand, hip abduction, HS curl, tandem stance with 1 UE support, mini squats with BUE support.   [place] level B completed with 2 lb ankle weight on BLE for improved LE proprioception. Exercises included side stepping for 25 feet R and L, backward walking x 25 ft,   Patient returned to room and left sitting in recliner with call bell within reach.   Session 2:   Patient receieved supine in bed and performed supine>sit transfer without cues or instruction and minor use of bed features. Patient attempted to don shoes sitting EOB, but unable to maintain balance without back support. Stand pivot transfer with supervision A with RW to recliner. Patient able to don shoes  without assist from PT sitting in recliner.   Gait training for 154ft x 2 with supervision A and RW . Min cues from PT for improved step length of the LLE, especially with turns to the R.     Standing balance on wedge with cross body and lateral reaches 2 x 12 bilaterally. Patient able to maintain standing balance without UE support and ue of ankle strategy to prevent posterior LOB.   Patient returned to room and performed transfer to recliner with supervision A.  Patient's family present for end of treatment and educated on home exercises with [place] level A and selected level B exercises. Patient's family also educated in purpose of CIR, and progression towards goals for safety in determining patient's d/c date. Re-education provided for interpretation of Berg balance test as clinical tool to determine appropriate AD and treatment interventions for therapists.       Therapy Documentation Precautions:  Precautions Precautions: Fall Precaution Comments: macular degeneration Restrictions Weight Bearing Restrictions: No   Pain: Pain Assessment Pain Assessment: No/denies pain Pain Score: 0-No pain  See Function Navigator for Current Functional Status.   Therapy/Group: Individual Therapy  Lorie Phenix 06/30/2015, 12:57 PM

## 2015-06-30 NOTE — Progress Notes (Signed)
Occupational Therapy Session Note  Patient Details  Name: Andrew [name] MRN: [id number] Date of Birth: August 19, 1923  Today's Date: 06/30/2015 OT Individual Time: [id number] and 1405-1506 OT Individual Time Calculation (min): 48 min and 61 minutes    Short Term Goals: Week 1:  OT Short Term Goal 1 (Week 1): Focus on LTGs due to short ELOS  Skilled Therapeutic Interventions/Progress Updates:    1) Treatment session with focus on self care, safety awareness, and pt and caregiver (wife) education. Pt ambulate to/from shower using RW with supervision. Pt completed approximately 90% of showering tasks at sitting using tub bench with x2 loss of balance while standing to wash buttocks using BUE with clinician providing Min A to steady. Provided pt education on safety while showering, bringing attention to loss of balance while using BUE and educated pt on use of at least one hand on grab bar to maintain balance. Provided energy conservation intervention and experiential education via having pt don socks/shoes at seated following shower, prior to pt standing to comb hair and brush teeth. Wife present and caregiver education provided about energy conservation, bathroom set up suggestions, home set up information, and pt goals. Continue with pt and wife education on safety, energy conservation, and AD for ADL/IADL completion.   2) Treatment session with focus on pt education and safety awareness. Pt completed simulated shower step-in using approximately 4" high blue shower box, shower seat, and RW. Pt required Min A and extra time with verbal cues for problem solving transfer technique. Clinician determined pt safest to use tub shower in guest bathroom in home rather than master, walk-in shower due to fewer restrictions and moving parts such as shower chair. Pt safely completed transfer to rolling office chair with it steadied against a wall cabinet. Discussed suggestions with pt regarding having rolling chair  steadied against bookshelf in office with wife supervising transfers in & out of chair to which pt agreed. Also suggested use of non-rolling chair in office as additional safety measure. Clinician called wife over the phone at home to communicate same recommendations to which she also agreed.   Therapy Documentation Precautions:  Precautions Precautions: Fall Precaution Comments: macular degeneration Restrictions Weight Bearing Restrictions: No General:   Vital Signs:  Pain:   Pt has no c/o pain.  See Function Navigator for Current Functional Status.   Therapy/Group: Individual Therapy  Dierdre Searles 06/30/2015, 3:17 PM

## 2015-06-30 NOTE — Consult Note (Addendum)
   [facility] CM Inpatient Consult   06/30/2015  Ric Rosenberg Tillman [birth year] [id number]  Referral received.  Met with the patient at the bedside.  Explained [employer] services for post rehab follow up in the community.  Patient is eligible with his [employer]. Patient states he was pretty active prior to stroke.  Patient states he makes his decisions with his wife and daughter and states that they are out and about today and planning to be here later tonight after 4:30 pm and tomorrow.  Patient given a brochure with contact information on it and request a return call.  Will attempt follow up tomorrow as the patient states his discharge is planned for Saturday.  For questions, please contact:  Natividad Brood, RN BSN [facility] Liaison  [phone] business mobile phone Toll free office [phone]

## 2015-07-01 ENCOUNTER — Inpatient Hospital Stay (HOSPITAL_COMMUNITY): Payer: Medicare Other | Admitting: Physical Therapy

## 2015-07-01 ENCOUNTER — Inpatient Hospital Stay (HOSPITAL_COMMUNITY): Payer: Medicare Other | Admitting: Occupational Therapy

## 2015-07-01 MED ORDER — FUROSEMIDE 40 MG PO TABS: 40.0000 mg | ORAL_TABLET | Freq: Every day | ORAL | Status: DC

## 2015-07-01 MED ORDER — FLUOXETINE HCL 10 MG PO TABS: 10.0000 mg | ORAL_TABLET | Freq: Every day | ORAL | Status: DC

## 2015-07-01 MED ORDER — ISOSORBIDE MONONITRATE ER 60 MG PO TB24: 60.0000 mg | ORAL_TABLET | Freq: Every day | ORAL | Status: DC

## 2015-07-01 MED ORDER — FUROSEMIDE 40 MG PO TABS: ORAL_TABLET | ORAL | Status: DC

## 2015-07-01 MED ORDER — MIRABEGRON ER 25 MG PO TB24: 25.0000 mg | ORAL_TABLET | Freq: Every day | ORAL | Status: DC

## 2015-07-01 MED ORDER — POTASSIUM CHLORIDE CRYS ER 20 MEQ PO TBCR: 20.0000 meq | EXTENDED_RELEASE_TABLET | Freq: Every day | ORAL | Status: DC

## 2015-07-01 MED ORDER — CYANOCOBALAMIN 1000 MCG PO TABS: 1000.0000 ug | ORAL_TABLET | Freq: Every day | ORAL | Status: DC

## 2015-07-01 MED ORDER — PRAVASTATIN SODIUM 40 MG PO TABS: 40.0000 mg | ORAL_TABLET | Freq: Every day | ORAL | Status: DC

## 2015-07-01 NOTE — Discharge Summary (Signed)
Andrew [name], Andrew [name]                 ACCOUNT NO.:  [id number]  MEDICAL RECORD NO.:  [id number]  LOCATION:  4M04C                        FACILITY:  [facility]  PHYSICIAN:  Charlett Blake, M.D.DATE OF BIRTH:  13-Oct-1923  DATE OF ADMISSION:  06/22/2015 DATE OF DISCHARGE:  07/02/2015                              DISCHARGE SUMMARY   DISCHARGE DIAGNOSES: 1. Nonhemorrhagic infarct, posterior limb right internal capsule. 2. SCDs for DVT prophylaxis. 3. Pain management. 4. Depression. 5. Diastolic congestive heart failure. 6. Hypertension.  HISTORY OF PRESENT ILLNESS:  This is a 82 year old right-handed male with history of hypertension, CAD with CABG maintained on aspirin, prostate cancer, diastolic congestive heart failure, remote tobacco abuse, left carotid stenosis 60-79%.  Lives with his wife, independent with a cane prior to admission.  Presented on Jun 20, 2015, with left- sided weakness.  MRI showed small acute nonhemorrhagic infarct, posterior limb right internal capsule.  Question tiny medial left parietal lobe infarct.  MRA with no large vessel occlusion.  The patient did not receive tPA.  Echocardiogram with ejection fraction of [id number], grade 2 diastolic dysfunction.  No wall motion abnormalities.  Followup carotid Dopplers Jun 22, 2015, showed 60-79% left ICA stenosis, no significant changes compared to January of 2017, neurology follow up, maintained on aspirin.  Tolerating a regular diet.  The patient was admitted for comprehensive rehab program.  PAST MEDICAL HISTORY:  See discharge diagnoses.  SOCIAL HISTORY:  Lives with wife, independent with a cane prior to admission.  Functional status upon admission to rehab services was moderate assist, 100 feet with assistive device; minimal assist, sit to stand; min to mod assist for activities of daily living.  PHYSICAL EXAMINATION:  VITAL SIGNS:  Blood pressure 127/52, pulse 70, temperature 98, respirations 16. GENERAL:  This  was an alert male, in no acute distress.  Oriented to person, place, and time. LUNGS:  Clear to auscultation without wheeze. CARDIAC:  Regular rate and rhythm without murmur. ABDOMEN:  Soft, nontender.  Good bowel sounds.  He was extremely hard of hearing.  [place] COURSE:  The patient was admitted to inpatient rehab services with therapies initiated on a 3-hour daily basis, consisting of physical therapy, occupational therapy, and rehabilitation nursing.  The following issues were addressed during the patient's rehabilitation stay.  Pertaining to Andrew [name] right internal capsule infarct remained stable, maintained on aspirin therapy, he would follow up with Neurology Services.  SCDs for DVT prophylaxis.  Blood pressure is well controlled.  He had been on Imdur and Toprol prior to admission, noted permissive hypertension. His Imdur was started back 60 mg daily and monitored. He remained off Toprol. No chest pain or shortness of breath.  He exhibited no signs of fluid overload.  He continued on Lasix 40 mg daily.  The patient received weekly collaborative interdisciplinary team conferences to discuss estimated length of stay, family teaching, and any barriers to discharge.  The patient modified independent.  Ambulates to the gym with a rolling walker.  Demonstrates some increased fall risks, 25/56 on Berg balance scale, this was discussed at length with family.  He could gather his belongings for activities of daily living and homemaking.  All issues in regard to memory challenged standing endurance for improved performance, compensatory strategies for any fatigue factors.  He was discharged to home with ongoing therapies dictated per [facility]  DISCHARGE MEDICATIONS: 1. Aspirin 325 mg p.o. daily. 2. Colace 100 mg p.o. at bedtime. 3. Prozac 10 mg p.o. daily. 4. Lasix 40 mg p.o. daily. 5. Myrbetriq 25 mg p.o. daily. 6. Multivitamin daily. 7. Potassium  chloride 20 mEq daily. 8. Pravachol 40 mg daily. 9. Vitamin B12 at 1000 mcg p.o. daily. 10. Imdur  60 mg daily  FOLLOWUP:  The patient would follow up Dr. Alysia Penna at the outpatient rehab center as directed; Dr. Antony Contras, 1 month call for appointment; and PCP Dr. [name], Medical Management.     Lauraine Rinne, P.A.   ______________________________ Charlett Blake, M.D.    DA/MEDQ  D:  06/30/2015  T:  07/01/2015  Job:  [id number]  cc:   Pramod P. Leonie Man, MD Kathryne Eriksson, MD

## 2015-07-01 NOTE — Progress Notes (Signed)
Occupational Therapy Session Note  Patient Details  Name: Andrew [name] MRN: [id number] Date of Birth: 03-24-1923  Today's Date: 07/01/2015 OT Individual Time: [id number] and [id number] OT Individual Time Calculation (min): 30 min and 59 minutes     Short Term Goals: Week 1:  OT Short Term Goal 1 (Week 1): Focus on LTGs due to short ELOS  Skilled Therapeutic Interventions/Progress Updates:    1) Session focused on education and experiential learning for both pt and family. Wife present for entirety of session with request for suggestions list to follow up with at home. Pt completed all showering tasks in department tub shower using tub bench for improved safety during transfer, entry/exit, showering, and dressing tasks. Energy conservation principles and safety awareness training reiterated to both pt and wife. Discussed pt home office arrangement for improved safety during pt engagement in valued leisure activities. Pt and wife agreed verbally and demonstrated understanding by restating suggestions and instructions.   2) Session with focus on pt education, skill generalization and carryover, and standing endurance. Reviewed pt suggestions for home and created list for pt and wife to use at home for improved carryover of energy conservation skills, home modification suggestions, AD use, and safety awareness. Pt agreed with all notes and offered input as evidence of good learning and recall of education provided. Completed BUE activity to challenge fine motor skills, lateral tripod and three jaw chuck grasps, L visual attention/scanning, and standing endurance. Pt able to maintain standing balance for x2 activities at 7 minutes and 4 minutes. Left pt in room with all needs within reach.   Therapy Documentation Precautions:  Precautions Precautions: Fall Precaution Comments: macular degeneration Restrictions Weight Bearing Restrictions: No General:   Vital Signs: Therapy Vitals Temp: 98.3 F  (36.8 C) Temp Source: Oral Pulse Rate: 76 Resp: 18 BP: 120/61 mmHg Patient Position (if appropriate): Sitting Oxygen Therapy SpO2: 95 % O2 Device: Not Delivered Pain: Pain Assessment Pain Assessment: 0-10 Pain Score: 8  Pain Type: Acute pain Pain Location: Head Pain Orientation: Anterior Pain Descriptors / Indicators: Headache Pain Frequency: Intermittent Pain Onset: Gradual Patients Stated Pain Goal: 2 Pain Intervention(s): Medication (See eMAR) Multiple Pain Sites: No  See Function Navigator for Current Functional Status.   Therapy/Group: Individual Therapy  Dierdre Searles 07/01/2015, 3:10 PM

## 2015-07-01 NOTE — Plan of Care (Signed)
Problem: RH Balance Goal: LTG Patient will maintain dynamic standing with ADLs (OT) LTG: Patient will maintain dynamic standing balance with assist during activities of daily living (OT)  Outcome: Completed/Met Date Met:  07/01/15 Recommend supervision with prolonged standing activities but able to complete standing for ADLs at overall Mod I

## 2015-07-01 NOTE — Progress Notes (Signed)
Physical Therapy Discharge Summary  Patient Details  Name: Andrew [name] MRN: [id number] Date of Birth: March 31, 1923  Today's Date: 07/02/2015    Patient has met 10 of 10 long term goals due to improved activity tolerance, improved balance, improved postural control, increased strength, increased range of motion, ability to compensate for deficits and improved awareness.  Patient to discharge at an ambulatory level Supervision.   Patient's care partner is independent to provide the necessary physical assistance at discharge.    Recommendation:  Patient will benefit from ongoing skilled PT services in outpatient setting to continue to advance safe functional mobility, address ongoing impairments in Strength, balance, gait, safety, endurance, and minimize fall risk.  Equipment: No equipment provided  Reasons for discharge: treatment goals met and discharge from hospital  Patient/family agrees with progress made and goals achieved: Yes  PT Discharge Precautions/Restrictions Precautions Precautions: Fall Restrictions Weight Bearing Restrictions: No Vital Signs  Pain Pain Assessment Pain Assessment: No/denies pain Pain Score: 0-No pain Vision/Perception     Cognition Orientation Level: Oriented X4 Memory: Appears intact Awareness: Appears intact Problem Solving: Appears intact Safety/Judgment: Impaired Comments: mild decreased safety awareness.  Sensation Sensation Light Touch: Appears Intact Hot/Cold: Appears Intact Proprioception: Appears Intact Coordination Gross Motor Movements are Fluid and Coordinated: No Fine Motor Movements are Fluid and Coordinated: No Coordination and Movement Description: slight ataxia in L UE and L LE  Finger Nose Finger Test: decreased speed bilaterally, no ataxia noted.  Motor  Motor Motor: Ataxia Motor - Discharge Observations: Patient demonstrates milld ataxia in LLE which is more pronounced without UE support with functional activity.    Mobility Bed Mobility Bed Mobility: Rolling Right;Rolling Left;Supine to Sit;Sit to Supine Rolling Right: 6: Modified independent (Device/Increase time) Rolling Left: 6: Modified independent (Device/Increase time) Supine to Sit: 6: Modified independent (Device/Increase time) Sit to Supine: 6: Modified independent (Device/Increase time) Transfers Transfers: Yes Sit to Stand: 5: Supervision Sit to Stand Details: Verbal cues for precautions/safety Stand to Sit: 5: Supervision Stand to Sit Details (indicate cue type and reason): Verbal cues for precautions/safety Stand Pivot Transfers: 5: Supervision Stand Pivot Transfer Details: Verbal cues for precautions/safety;Verbal cues for technique Locomotion  Ambulation Ambulation: Yes Ambulation/Gait Assistance: 5: Supervision Ambulation Distance (Feet): 150 Feet Assistive device: Rolling walker Ambulation/Gait Assistance Details: Verbal cues for gait pattern;Verbal cues for safe use of DME/AE;Verbal cues for precautions/safety Gait Gait Pattern: Impaired Gait Pattern: Decreased step length - left;Ataxic;Poor foot clearance - left Gait velocity: decreased for age.  Stairs / Additional Locomotion Stairs: Yes Stairs Assistance: 5: Supervision Stairs Assistance Details: Verbal cues for gait pattern;Verbal cues for precautions/safety;Verbal cues for technique Stair Management Technique: Two rails Number of Stairs: 12 Height of Stairs: 6 Ramp: 5: Supervision Curb: 5: Psychiatric nurse: Yes Wheelchair Assistance: 6: Modified independent (Device/Increase time) Environmental health practitioner: Both upper extremities Wheelchair Parts Management: Independent Distance: 24f  Trunk/Postural Assessment  Cervical Assessment Cervical Assessment: Exceptions to [facility] head) Thoracic Assessment Thoracic Assessment: Within Functional Limits Lumbar Assessment Lumbar Assessment: Within Functional Limits Postural  Control Postural Control: Deficits on evaluation  Balance Dynamic Sitting Balance Dynamic Sitting - Balance Support: Right upper extremity supported Dynamic Sitting - Level of Assistance: 6: Modified independent (Device/Increase time) Static Standing Balance Static Standing - Balance Support: No upper extremity supported Static Standing - Level of Assistance: 5: Stand by assistance Dynamic Standing Balance Dynamic Standing - Balance Support: No upper extremity supported Dynamic Standing - Level of Assistance: 5: Stand by assistance Extremity Assessment  RLE Assessment RLE Assessment: Within Functional Limits LLE Assessment LLE Assessment: Within Functional Limits   See Function Navigator for Current Functional Status.  Lorie Phenix 07/01/2015, 11:58 AM

## 2015-07-01 NOTE — Progress Notes (Signed)
Social Work Patient ID: Andrew [name], male   DOB: 04/25/1923, 82 y.o.   MRN: [id number]   CSW met with pt, his wife, and his dtr (Chanteyl) on 06-29-15 after team conference to update them on discussion and targeted d/c date.  Pt's dtr was upset stating that they were told and were expecting 15-18 days.  CSW explained that's an estimate and that evals on done upon pt's arrival to the unit and he will meet goals likely by 07-02-15.  Dtr had questions for the MD, so CSW asked Dr. Kirsteins to visit.  He came while CSW still in the room and answered dtr's questions.  Family will prepare for 07-02-15 d/c.  Pt's wife will take him for outpt therapies and only needs DME for the shower.  OT to troubleshoot this with family and CSW to order as needed.  Has commode and RW at home already.  CSW set up outpt appts.  Family to come for family education prior to d/c.  CSW remains available as needed.  

## 2015-07-01 NOTE — Patient Care Conference (Signed)
Inpatient RehabilitationTeam Conference and Plan of Care Update Date: 06/29/2015   Time: 11:10 AM    Patient Name: Andrew [name]      Medical Record Number: [id number]  Date of Birth: Feb 08, 1923 Sex: Male         Room/Bed: 4M04C/4M04C-01 Payor Info: Payor: [employer] / Plan: [employer] MEDICARE / Product Type: *No Product type* /    Admitting Diagnosis: cva  Admit Date/Time:  06/24/2015  6:11 PM Admission Comments: No comment available   Primary Diagnosis:  <principal problem not specified> Principal Problem: <principal problem not specified>  Patient Active Problem List   Diagnosis Date Noted  . Essential hypertension   . Dry eyes   . Left hemiparesis ([place]) 06/24/2015  . Alcohol abuse   . Benign essential HTN   . Coronary artery disease involving coronary bypass graft of native heart without angina pectoris   . Bradycardia   . Thrombocytopenia ([place])   . Right-sided cerebrovascular accident (CVA) ([place])   . CVA (cerebral infarction) 06/20/2015  . Stroke ([place]) 06/20/2015  . Macrocytosis without anemia 06/20/2015  . Hyperbilirubinemia 06/20/2015  . Cerebral infarction due to unspecified mechanism   . Chronic renal insufficiency, stage III (moderate) 06/02/2014  . Macular degeneration (senile) of retina 04/29/2014  . 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 Major depressive disorder, single episode 06/11/2013  . TIA (transient ischemic attack) 06/03/2013  . Pancreatic cyst 01/29/2013  . OSA (obstructive sleep apnea) 12/26/2012  . Chronic diastolic CHF (congestive heart failure) ([place]) 08/19/2012  . COPD, minimal-mild 12/22/2010  . S/P excision of acoustic neuroma 06/18/2010  . Pulmonary nodule 06/17/2010  . Vertigo 06/14/2010  . THYROID NODULE 03/13/2010  . PVD- moderate carotid disease 03/07/2010  . Spinal stenosis, lumbar region, with neurogenic claudication 07/13/2009  . ALLERGIC RHINITIS 09/01/2008  . COLONIC POLYPS, HX OF 09/01/2008  . HYPERCHOLESTEROLEMIA 06/18/2008  .  Angina, class II ([place]) 06/18/2008  . Hx of CABG 06/18/2008  . Moderate aortic stenosis 06/18/2008  . GASTROESOPHAGEAL REFLUX DISEASE 06/18/2008    Expected Discharge Date: Expected Discharge Date: 07/02/15  Team Members Present: Physician leading conference: Dr. Alysia Penna Social Worker Present: Alfonse Alpers, LCSW Nurse Present: Dorien Chihuahua, RN PT Present: Kem Parkinson, PT OT Present: Simonne Come, OT SLP Present: Windell Moulding, SLP     Current Status/Progress Goal Weekly Team Focus  Medical   urinalysis pending, PT reports several episodes of angina  improve bladder cont  bladder cont   Bowel/Bladder   continent of bowel and bladder. LBM 06/27/15  Manage bowel and bladder with Mod I assist   Offer toileting q 2 hours and prn    Swallow/Nutrition/ Hydration             ADL's   Min A with CGA for prolonged standing   Mod I   increased standing endurance, improved BUE endurance and strength, pt education, d/c planning    Mobility   S bed mobility, S/min guard transfers and gait with LRAD, min guard stairs  mod I bed mobility, S transfers and gait with LRAD, S stairs with BUE support  dynamic standing balance, NMR, gait training, activity tolerance   Communication             Safety/Cognition/ Behavioral Observations            Pain   no complaints of pain   <4  Assess pain q4 horus and prn    Skin   CDI   No new skin breakdown  Asses skin  q shift and prn     Rehab Goals Patient on target to meet rehab goals: Yes Rehab Goals Revised: some goals have been upgraded *See Care Plan and progress notes for long and short-term goals.  Barriers to Discharge: Elderly caregiver    Possible Resolutions to Barriers:  Will upgrade to Mod I    Discharge Planning/Teaching Needs:  Pt to return to his home with his wife and dtrs to assist as needed, although most of pt's goals are supervision to mod I.  Pt's wife and dtr and maybe a grandson to come in for family  education.   Team Discussion:  MD to check for UTI, but overall is doing pretty well medically.  OT has mod I goals with pt at supervision to min now.  Talking with him about energy conservation.  PT has supervision ambulation goals with mod I transfers.  Pt told PT he's had some angina, but hasn't told the medical team.  Therapists would like outpt f/u, with tub bench ordered.  Revisions to Treatment Plan:  none   Continued Need for Acute Rehabilitation Level of Care: The patient requires daily medical management by a physician with specialized training in physical medicine and rehabilitation for the following conditions: Daily direction of a multidisciplinary physical rehabilitation program to ensure safe treatment while eliciting the highest outcome that is of practical value to the patient.: Yes Daily medical management of patient stability for increased activity during participation in an intensive rehabilitation regime.: Yes Daily analysis of laboratory values and/or radiology reports with any subsequent need for medication adjustment of medical intervention for : Neurological problems;Urological problems  Andrew [name], Silvestre Mesi 07/01/2015, 2:18 AM

## 2015-07-01 NOTE — Progress Notes (Signed)
Physical Therapy Session Note  Patient Details  Name: Andrew [name] MRN: [id number] Date of Birth: [birth year]  Today's Date: 07/01/2015 PT Individual Time: 1300-1353 PT Individual Time Calculation (min): 53 min   Short Term Goals: Week 1:  PT Short Term Goal 1 (Week 1): STG=LTG due to ELOS.   Skilled Therapeutic Interventions/Progress Updates:   Pt received in recliner; pt reports his family has left for the day but can be back in the am for family education.  Pt alerted to time for therapy in the am; pt to contact family and alert them to time to be present for education.  Performed gait with RW x 150' while assessing incidence of L foot drop/drag during dual cognitive task and when ambulating over obstacles and carpet.  Pt required almost max cues to for L foot clearance and full step length.  Educated pt on use of foot up orthosis to facilitate L foot clearance during gait in home and community environment.  Performed obstacle negotiation-over thresholds, up/down curb, around cones and up/down 4 stairs x 2 reps with 2 rails with foot up brace donned with supervision and only one incidence of foot drag; still required cues for full step length.  Will obtain order for foot up brace from PA.  Reviewed safe stair negotiation sequence discussing with pt having wife carry his RW to top or bottom of stairs prior to ascending or descending; pt with increased difficulty sequencing this.  Demonstrated sequence to pt but pt still required repeated explanations.  Will review with wife tomorrow. Returned to room with RW and orthosis with supervision and verbal cues for more upright posture and continued full L step length.  Pt left in recliner with all items within reach to await OT.  Therapy Documentation Precautions:  Precautions Precautions: Fall Precaution Comments: macular degeneration Restrictions Weight Bearing Restrictions: No Pain: Pain Assessment Pain Assessment: No/denies pain Pain Score: 8   Pain Type: Acute pain Pain Location: Head Pain Orientation: Anterior Pain Descriptors / Indicators: Headache Pain Frequency: Intermittent Pain Onset: Gradual Patients Stated Pain Goal: 2 Pain Intervention(s): Medication (See eMAR) Multiple Pain Sites: No  See Function Navigator for Current Functional Status.   Therapy/Group: Individual Therapy  Raylene Everts [facility] 07/01/2015, 4:58 PM

## 2015-07-01 NOTE — Progress Notes (Deleted)
Occupational Therapy Discharge Summary  Patient Details  Name: Andrew [name] MRN: [id number] Date of Birth: 21-Jul-1923  Patient has met 6 of 8 long term goals due to improved activity tolerance, improved balance, postural control, ability to compensate for deficits, improved attention and improved awareness.  Patient to discharge at overall Supervision level.  Patient's care partner is independent to provide the necessary physical and cognitive assistance at discharge.    Reasons goals not met: Pt continues to require supervision with toilet transfers and toileting tasks due to impaired balance reactions.  Recommend supervision with mobility and transfers at this time.  Recommendation:  Patient will benefit from ongoing skilled OT services in outpatient setting to continue to advance functional skills in the area of BADL, iADL and Reduce care partner burden.  Equipment: tub transfer bench  Reasons for discharge: treatment goals met and discharge from hospital  Patient/family agrees with progress made and goals achieved: Yes  OT Discharge Precautions/Restrictions  Precautions Precautions: Fall Precaution Comments: macular degeneration General   Vital Signs Therapy Vitals Temp: 98.3 F (36.8 C) Temp Source: Oral Pulse Rate: 76 Resp: 18 BP: 120/61 mmHg Patient Position (if appropriate): Sitting Oxygen Therapy SpO2: 95 % O2 Device: Not Delivered Pain Pain Assessment Pain Assessment: 0-10 Pain Score: 8  Pain Type: Acute pain Pain Location: Head Pain Orientation: Anterior Pain Descriptors / Indicators: Headache Pain Frequency: Intermittent Pain Onset: Gradual Patients Stated Pain Goal: 2 Pain Intervention(s): Medication (See eMAR) Multiple Pain Sites: No ADL  See Function Navigator Vision/Perception  Vision- History Baseline Vision/History: Macular Degeneration;Wears glasses Wears Glasses: At all times Patient Visual Report: No change from baseline Vision-  Assessment Additional Comments: macular degeneration bilaterally, Lt> Rt  Cognition Overall Cognitive Status: Within Functional Limits for tasks assessed Arousal/Alertness: Awake/alert Orientation Level: Oriented X4 Attention: Alternating Memory: Appears intact Awareness: Appears intact Problem Solving: Appears intact Safety/Judgment: Impaired Comments: mild decreased safety awareness.  Sensation Sensation Light Touch: Appears Intact Hot/Cold: Appears Intact Proprioception: Appears Intact Coordination Gross Motor Movements are Fluid and Coordinated: No Fine Motor Movements are Fluid and Coordinated: No Coordination and Movement Description: slight ataxia in L UE and L LE  Finger Nose Finger Test: decreased speed bilaterally, no ataxia noted.  9 Hole Peg Test: did not complete due to visual impairments (macular degeneration) Motor    Mobility  Bed Mobility Bed Mobility: Rolling Right;Rolling Left;Supine to Sit;Sit to Supine Rolling Right: 6: Modified independent (Device/Increase time) Rolling Left: 6: Modified independent (Device/Increase time) Supine to Sit: 6: Modified independent (Device/Increase time) Sit to Supine: 6: Modified independent (Device/Increase time) Transfers Sit to Stand: 5: Supervision Sit to Stand Details: Verbal cues for precautions/safety Stand to Sit: 5: Supervision Stand to Sit Details (indicate cue type and reason): Verbal cues for precautions/safety  Trunk/Postural Assessment  Cervical Assessment Cervical Assessment: Exceptions to [facility] (forward head) Thoracic Assessment Thoracic Assessment: Within Functional Limits Lumbar Assessment Lumbar Assessment: Within Functional Limits Postural Control Postural Control: Deficits on evaluation  Balance Dynamic Sitting Balance Dynamic Sitting - Balance Support: Right upper extremity supported Dynamic Sitting - Level of Assistance: 6: Modified independent (Device/Increase time) Static Standing  Balance Static Standing - Balance Support: No upper extremity supported Static Standing - Level of Assistance: 5: Stand by assistance Dynamic Standing Balance Dynamic Standing - Balance Support: No upper extremity supported Dynamic Standing - Level of Assistance: 5: Stand by assistance Extremity/Trunk Assessment RUE Assessment RUE Assessment: Within Functional Limits (ROM WFL, strength grossly 4/5) LUE Assessment LUE Assessment: Within Functional Limits (ROM WFL,  strength grossly 4/5)   See Function Navigator for Current Functional Status.  Simonne Come 07/01/2015, 3:53 PM

## 2015-07-01 NOTE — Progress Notes (Signed)
[facility]     PROGRESS NOTE  Subjective/Complaints:  Pt not sure if bladder freq slowed down No issues overnite. Wants to stay as long as he needs to here  ROS: + Chronic dry eyes. Denies CP, SOB, nausea, vomiting, diarrhea.  Objective: Vital Signs: Blood pressure 158/63, pulse 67, temperature 98.1 F (36.7 C), temperature source Oral, resp. rate 20, height 5\' 10"  (1.778 m), weight 87.3 kg (192 lb 7.4 oz), SpO2 96 %. No results found. No results for input(s): WBC, HGB, HCT, PLT in the last 72 hours. No results for input(s): NA, K, CL, GLUCOSE, BUN, CREATININE, CALCIUM in the last 72 hours.  Invalid input(s): CO CBG (last 3)  No results for input(s): GLUCAP in the last 72 hours.  Wt Readings from Last 3 Encounters:  07/01/15 87.3 kg (192 lb 7.4 oz)  06/20/15 88.451 kg (195 lb)  05/02/15 88.792 kg (195 lb 12 oz)    Physical Exam:  BP 158/63 mmHg  Pulse 67  Temp(Src) 98.1 F (36.7 C) (Oral)  Resp 20  Ht 5\' 10"  (1.778 m)  Wt 87.3 kg (192 lb 7.4 oz)  BMI 27.62 kg/m2  SpO2 96% Constitutional: He appears well-developed and well-nourished. NAD. HENT: fair dentition, oral mucosa pink and moist Head: Normocephalic and atraumatic.  Eyes: Conjunctivae and EOM are normal.  Cardiovascular: Normal rate and regular rhythm.  Respiratory: Effort normal and breath sounds normal. No respiratory distress.  GI: Soft. Bowel sounds are normal. He exhibits no distension.  Musculoskeletal: He exhibits no edema or tenderness.  Neurological: He is alert and oriented.  Patient is hard of hearing.  Left facial weakness (baseline) Motor: B/l UE 4+/5 delt,bic,tric,wrist,hand RLE: 4+/5 proximal to distal  LLE: Hip flexion 3+/5, knee extension 4/5, ankle dorsi/plantar flexion 4+/5  Skin: Skin is warm and dry.  Psychiatric: He has a normal mood and affect. His behavior is normal  Assessment/Plan: 1. 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 Physiatrist is providing close team supervision and 24 hour management of active medical problems listed below. Physiatrist and rehab team continue to assess barriers to discharge/monitor patient progress toward functional and medical goals.  Function:  Bathing Bathing position Bathing activity did not occur: Refused Position: Production manager parts bathed by patient: Right arm, Left arm, Chest, Abdomen, Front perineal area, Buttocks, Right upper leg, Left lower leg, Right lower leg, Left upper leg Body parts bathed by helper: Back, Left lower leg, Right lower leg  Bathing assist Assist Level: Touching or steadying assistance(Pt > 75%)      Upper Body Dressing/Undressing Upper body dressing Upper body dressing/undressing activity did not occur: Refused What is the patient wearing?: Pull over shirt/dress     Pull over shirt/dress - Perfomed by patient: Thread/unthread right sleeve, Thread/unthread left sleeve, Put head through opening, Pull shirt over trunk          Upper body assist Assist Level: Set up   Set up : To obtain clothing/put away  Lower Body Dressing/Undressing Lower body dressing Lower body dressing/undressing activity did not occur: Refused What is the patient wearing?: Underwear, Pants, Socks, Shoes Underwear - Performed by patient: Thread/unthread right underwear leg, Thread/unthread left underwear leg, Pull underwear up/down   Pants- Performed by patient: Thread/unthread right pants leg, Thread/unthread left pants leg, Pull pants up/down   Non-skid slipper socks- Performed by patient:  Don/doff right sock, Don/doff left sock Non-skid slipper socks- Performed by helper: Don/doff right sock, Don/doff left sock Socks - Performed by patient: Don/doff right sock, Don/doff left sock   Shoes - Performed by  patient: Don/doff right shoe, Don/doff left shoe            Lower body assist Assist for lower body dressing: Supervision or verbal cues      Toileting Toileting   Toileting steps completed by patient: Adjust clothing prior to toileting, Performs perineal hygiene, Adjust clothing after toileting Toileting steps completed by helper: Adjust clothing after toileting Toileting Assistive Devices: Grab bar or rail  Toileting assist Assist level: Touching or steadying assistance (Pt.75%)   Transfers Chair/bed transfer   Chair/bed transfer method: Stand pivot Chair/bed transfer assist level: Supervision or verbal cues Chair/bed transfer assistive device: Armrests, Medical sales representative     Max distance: 133ft Assist level: Supervision or verbal cues   Wheelchair   Type: Manual Max wheelchair distance: 169ft Assist Level: Supervision or verbal cues  Cognition Comprehension Comprehension assist level: Follows complex conversation/direction with extra time/assistive device  Expression Expression assist level: Expresses complex 90% of the time/cues < 10% of the time  Social Interaction Social Interaction assist level: Interacts appropriately with others - No medications needed.  Problem Solving Problem solving assist level: Solves complex 90% of the time/cues < 10% of the time  Memory Memory assist level: More than reasonable amount of time    Medical Problem List and Plan: 1. 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 DVT Prophylaxis/Anticoagulation: SCDs. Monitor for any signs of DVT 3. Pain Management: Tylenol as needed 4. Mood: Prozac 10 mg daily 5. Neuropsych: This patient is capable of making decisions on his own behalf. 6. Skin/Wound Care: Routine skin checks 7. Fluids/Electrolytes/Nutrition: Routine I&O with follow-up chemistries, 100%meals, 857ml fluids 8. Diastolic congestive heart  failure. Lasix 40 mg daily. No signs of fluid overload. Weight 80kg, question accuracy given divergence from other recent values 9. Hypertension. Allow permissive hypertension. Patient on Imdur 90 mg daily, Toprol 25 mg daily prior to admission. Resume as needed  elevated within last 24 hours, if this persists will consider increasing medications Filed Vitals:   06/30/15 1522 07/01/15 0559  BP: 107/53 158/63  Pulse: 62 67  Temp: 98.2 F (36.8 C) 98.1 F (36.7 C)  Resp: 18 20   10. CAD status post CABG. Continue aspirin 11. Hyperlipidemia. Pravachol 12. History of alcohol use. Patient consumes approximately 4.2 ounces of alcohol per week.  do not anticipate withdrawal with reported usage 13. Dry eyes: Patient to bring drops from home. 14.  Urinary incont- UA neg, will trial low dose myrbetriq, pt would like to try at home LOS (Days) 7 A FACE TO FACE EVALUATION WAS PERFORMED  Charlett Blake 07/01/2015 7:29 AM

## 2015-07-01 NOTE — Discharge Instructions (Signed)
Inpatient Rehab Discharge Instructions  Andrew [name] [facility] Discharge date and time: No discharge date for patient encounter.   Activities/Precautions/ Functional Status: Activity: activity as tolerated Diet: regular diet Wound Care: none needed Functional status:  ___ No restrictions     ___ Walk up steps independently ___ 24/7 supervision/assistance   ___ Walk up steps with assistance ___ Intermittent supervision/assistance  ___ Bathe/dress independently ___ Walk with walker     _x STROKE/TIA DISCHARGE INSTRUCTIONS SMOKING Cigarette smoking nearly doubles your risk of having a stroke & is the single most alterable risk factor  If you smoke or have smoked in the last 12 months, you are advised to quit smoking for your health.  Most of the excess cardiovascular risk related to smoking disappears within a year of stopping.  Ask you doctor about anti-smoking medications  [place] Quit Line: 1-800-QUIT NOW  Free Smoking Cessation Classes [phone]  CHOLESTEROL Know your levels; limit fat & cholesterol in your diet  Lipid Panel     Component Value Date/Time   CHOL 115 06/21/2015 0538   TRIG 71 06/21/2015 0538   HDL 45 06/21/2015 0538   CHOLHDL 2.6 06/21/2015 0538   VLDL 14 06/21/2015 0538   LDLCALC 56 06/21/2015 0538      Many patients benefit from treatment even if their cholesterol is at goal.  Goal: Total Cholesterol (CHOL) less than 160  Goal:  Triglycerides (TRIG) less than 150  Goal:  HDL greater than 40  Goal:  LDL (LDLCALC) less than 100   BLOOD PRESSURE American Stroke Association blood pressure target is less that 120/80 mm/Hg  Your discharge blood pressure is:  BP: 133/77 mmHg  Monitor your blood pressure  Limit your salt and alcohol intake  Many individuals will require more than one medication for high blood pressure  DIABETES (A1c is a blood sugar average for last 3 months) Goal HGBA1c is under 7% (HBGA1c is blood sugar average for last 3 months)   Diabetes: No known diagnosis of diabetes    Lab Results  Component Value Date   HGBA1C 5.4 06/23/2015     Your HGBA1c can be lowered with medications, healthy diet, and exercise.  Check your blood sugar as directed by your physician  Call your physician if you experience unexplained or low blood sugars.  PHYSICAL ACTIVITY/REHABILITATION Goal is 30 minutes at least 4 days per week  Activity: Increase activity slowly, Therapies: Physical Therapy: Home Health Return to work:   Activity decreases your risk of heart attack and stroke and makes your heart stronger.  It helps control your weight and blood pressure; helps you relax and can improve your mood.  Participate in a regular exercise program.  Talk with your doctor about the best form of exercise for you (dancing, walking, swimming, cycling).  DIET/WEIGHT Goal is to maintain a healthy weight  Your discharge diet is: Diet Heart Room service appropriate?: Yes; Fluid consistency:: Thin  liquids Your height is:  Height: 5\' 10"  (177.8 cm) Your current weight is: Weight: 85.2 kg (187 lb 13.3 oz) Your Body Mass Index (BMI) is:  BMI (Calculated): 27  Following the type of diet specifically designed for you will help prevent another stroke.  Your goal weight range is:    Your goal Body Mass Index (BMI) is 19-24.  Healthy food habits can help reduce 3 risk factors for stroke:  High cholesterol, hypertension, and excess weight.  RESOURCES Stroke/Support Group:  Call [facility]  PATIENT Stroke warning signs and symptoms How to activate emergency medical system (call 911). Medications prescribed at discharge. Need for follow-up after discharge. Personal risk factors for stroke. Pneumonia vaccine given:  Flu vaccine given:  My questions have been answered, the writing is legible, and I understand these instructions.  I will adhere to these goals & educational materials that have been  provided to me after my discharge from the hospital.    __ Bathe/dress with assistance ___ Walk Independently    ___ Shower independently ___ Walk with assistance    ___ Shower with assistance ___ No alcohol     ___ Return to work/school ________  COMMUNITY REFERRALS UPON DISCHARGE:    Outpatient: PT     OT      Agency:  [facility] Phone:  [phone]  Appointment Date/Time:  07-05-15 OT at 12:45 pm; 07-08-15 PT at 2 pm  Medical Equipment/Items Ordered: tub bench  Agency/Supplier:  [facility]    Phone:  [phone]  Other:  [place] RESOURCES FOR PATIENT/FAMILY: Support Groups:  [facility] Stroke Support Group                              Meets the 2nd Thursday of the month (except June, July, and August) at 3 pm                              In the dayroom of Inpatient Rehab at [facility] [phone])  Special Instructions:    My questions have been answered and I understand these instructions. I will adhere to these goals and the provided educational materials after my discharge from the hospital.  Patient/Caregiver Signature _______________________________ Date __________  Clinician Signature _______________________________________ Date __________  Please bring this form and your medication list with you to all your follow-up doctor's appointments.

## 2015-07-01 NOTE — Progress Notes (Signed)
Orthopedic Tech Progress Note Patient Details:  Andrew [name] 1923-03-30 [id number] Called in brace order to [facility]. Patient ID: YANNICK GENTIS, male   DOB: 1924/01/06, 82 y.o.   MRN: [id number]   Darrol Poke 07/01/2015, 4:00 PM

## 2015-07-01 NOTE — Progress Notes (Signed)
Social Work Discharge Note  The overall goal for the admission was met for:   Discharge location: Yes - home  Length of Stay: Yes - 8 days  Discharge activity level: Yes - supervision to mod I  Home/community participation: Yes  Services provided included: MD, RD, PT, OT, RN, Pharmacy and [place]: Private Insurance: [employer]  Follow-up services arranged: Outpatient: PT/OT at [facility], DME: tub bench from [place] and Patient/Family has no preference for HH/DME agencies  Comments (or additional information):  Pt to have supervision from his family and wife will take him to outpt therapy.  Pt eager to go home, but feels he has progressed on CIR.  Dtr to stay to help for a little bit and other dtr is in [place].  Grandson to also help.  Patient/Family verbalized understanding of follow-up arrangements: Yes  Individual responsible for coordination of the follow-up plan: pt, pt's wife, and pt's dtrs  Confirmed correct DME delivered: Trey Sailors 07/01/2015    Khalid Lacko, Silvestre Mesi

## 2015-07-01 NOTE — Progress Notes (Signed)
Physical Therapy Session Note  Patient Details  Name: Andrew [name] MRN: [id number] Date of Birth: 04-24-1923  Today's Date: 07/01/2015 PT Individual Time: 1101-1202 Calculated individual treatment time: 61 min       Short Term Goals: Week 1:  PT Short Term Goal 1 (Week 1): STG=LTG due to ELOS.   Skilled Therapeutic Interventions/Progress Updates:    Patient received sitting in recliner and agreeable to PT Patient performed gait training for 143ft x 2 and 163ft with RW in controlled environment with supervision A with min cues for improved LLE step length and increased hip/knee flexion to prevent foot drag. Gait training alos performed on uneven surface for 17ft x 2 with RW and supervision A PT. PT required to provided mod ces for proper AD management to prevent anterior tipping of RW caused by COM being to close to front of RW.   PT instructed patient in gait training for simulated community environment through hospital gift shop for 182ft on carpeted surface. PT provided supervision A and min cues for improved LLE step height and length to prevent foot drag.    Bed mobility performed without cues or assistance from including sit<>supine and rolling L and R.   Stair training with BUE support. X 12 with supervision A from PT and min-mod cues for improved step to gait pattern ascending steps with uninvolved LE and descending steps with involved LE. Patient had noted difficulty maintain step to gait pattern on for greater that 3 steps, but could verbalize proper technique.   Car transfer training with supervision A from PT as well as min cue for improved UE placement with sit>stand to exit car.   WC mobility for 273ft in controlled environment without cues or assistance from PT.  Patient performed all transfers with RW and supervision A from PT with min cues for UE placement   Patient returned to room and left sitting in recliner with call bell within reach.       Therapy  Documentation Precautions:  Precautions Precautions: Fall Precaution Comments: macular degeneration Restrictions Weight Bearing Restrictions: No General:   Vital Signs: Therapy Vitals Temp: 98.1 F (36.7 C) Temp Source: Oral Pulse Rate: 67 Resp: 20 BP: (!) 158/63 mmHg Patient Position (if appropriate): Lying Oxygen Therapy SpO2: 96 % O2 Device: Not Delivered   See Function Navigator for Current Functional Status.   Therapy/Group: Individual Therapy  Lorie Phenix 07/01/2015, 7:40 AM

## 2015-07-01 NOTE — Progress Notes (Signed)
Occupational Therapy Discharge Summary  Patient Details  Name: Andrew [name] MRN: [id number] Date of Birth: 04/17/1923  Patient has met 8 of 8 term goals due to improved activity tolerance, improved balance, postural control, ability to compensate for deficits, improved attention and improved awareness. Patient to discharge at overall Supervision level. Patient's care partner is independent to provide the necessary physical and cognitive assistance at discharge.   Reasons goals not met: n/a  Recommendation:  Patient will benefit from ongoing skilled OT services in outpatient setting to continue to advance functional skills in the area of BADL, iADL and Reduce care partner burden.  Equipment: tub transfer bench  Reasons for discharge: treatment goals met and discharge from hospital  Patient/family agrees with progress made and goals achieved: Yes  OT Discharge Precautions/Restrictions  Precautions Precautions: Fall Precaution Comments: macular degeneration  Vital Signs Therapy Vitals Temp: 98.3 F (36.8 C) Temp Source: Oral Pulse Rate: 76 Resp: 18 BP: 120/61 mmHg Patient Position (if appropriate): Sitting Oxygen Therapy SpO2: 95 % O2 Device: Not Delivered   Pain Pain Assessment Pain Assessment: 0-10 Pain Score: 8  Pain Type: Acute pain Pain Location: Head Pain Orientation: Anterior Pain Descriptors / Indicators: Headache Pain Frequency: Intermittent Pain Onset: Gradual Patients Stated Pain Goal: 2 Pain Intervention(s): Medication (See eMAR) Multiple Pain Sites: No   ADL  See Function Navigator  Vision/Perception  Vision- History Baseline Vision/History: Macular Degeneration;Wears glasses Wears Glasses: At all times Patient Visual Report: No change from baseline Vision- Assessment Additional Comments: macular degeneration bilaterally, Lt> Rt   Cognition Overall Cognitive Status: Within Functional Limits for tasks assessed Arousal/Alertness:  Awake/alert Orientation Level: Oriented X4 Attention: Alternating Memory: Appears intact Awareness: Appears intact Problem Solving: Appears intact Safety/Judgment: Impaired Comments: mild decreased safety awareness.    Sensation Sensation Light Touch: Appears Intact Hot/Cold: Appears Intact Proprioception: Appears Intact Coordination Gross Motor Movements are Fluid and Coordinated: No Fine Motor Movements are Fluid and Coordinated: No Coordination and Movement Description: slight ataxia in L UE and L LE  Finger Nose Finger Test: decreased speed bilaterally, no ataxia noted.  9 Hole Peg Test: did not complete due to visual impairments (macular degeneration)   Extremity/Trunk Assessment RUE Assessment RUE Assessment: Within Functional Limits (ROM WFL, strength grossly 4/5) LUE Assessment LUE Assessment: Within Functional Limits (ROM WFL, strength grossly 4/5)   See Function Navigator for Current Functional Status.  HOXIE, SARAH 07/01/2015, 3:59 PM  

## 2015-07-02 ENCOUNTER — Inpatient Hospital Stay (HOSPITAL_COMMUNITY): Payer: Medicare Other

## 2015-07-02 ENCOUNTER — Ambulatory Visit (HOSPITAL_COMMUNITY): Payer: Medicare Other | Admitting: Physical Therapy

## 2015-07-02 NOTE — Progress Notes (Signed)
Occupational Therapy Session Note  Patient Details  Name: SHEMAR HUFNAGLE MRN: [id number] Date of Birth: 08-26-1923  Today's Date: 07/02/2015 OT Individual Time: 0915-1000 OT Individual Time Calculation (min): 45 min    Short Term Goals: Week 1:  OT Short Term Goal 1 (Week 1): Focus on LTGs due to short ELOS  Skilled Therapeutic Interventions/Progress Updates: ADL-retraining with focus on toilet transfer, tub bench transfer, clothing management and discharge planning.   Pt received seated at recliner and receptive for treatment.   OT educated pt on discharge goals remaining to include demonstration of toileting skills and request for re-ed on tub bench transfer.   No family pesent during sesison.   With setup to provide RW and don foot-up orthotic on left foot, pt completed transfer to [facility]  and demonstrated competence with clothing management and toilet hygiene without assistance.   Pt then ambulated to ADL apartment and performed a flawless transfer to and from tub bench.   Pt noted that configuration of bathroom was mirror image of his bathroom but this presented no difficulty for him.   Pt returned to his room and rested in recliner with all needs within reach.         Therapy Documentation Precautions:  Precautions Precautions: Fall Precaution Comments: macular degeneration Restrictions Weight Bearing Restrictions: No  Pain: Pain Assessment Pain Score: 2   See Function Navigator for Current Functional Status.   Therapy/Group: Individual Therapy  [place] 07/02/2015, 12:59 PM

## 2015-07-02 NOTE — Progress Notes (Signed)
Pt. Got d/c instructions and follow up appointments.Pt. Is ready to d/c home with family.

## 2015-07-02 NOTE — Progress Notes (Signed)
[place] PHYSICAL MEDICINE & REHABILITATION     PROGRESS NOTE  Subjective/Complaints:  Had to get up to empty his bladder twice. Drank a cup of coffee his wife brought up before he went to bed!  ROS: + Chronic dry eyes. Denies CP, SOB, nausea, vomiting, diarrhea.  Objective: Vital Signs: Blood pressure 170/94, pulse 71, temperature 97.9 F (36.6 C), temperature source Oral, resp. rate 18, height 5\' 10"  (1.778 m), weight 84.3 kg (185 lb 13.6 oz), SpO2 97 %. No results found. No results for input(s): WBC, HGB, HCT, PLT in the last 72 hours. No results for input(s): NA, K, CL, GLUCOSE, BUN, CREATININE, CALCIUM in the last 72 hours.  Invalid input(s): CO CBG (last 3)  No results for input(s): GLUCAP in the last 72 hours.  Wt Readings from Last 3 Encounters:  07/02/15 84.3 kg (185 lb 13.6 oz)  06/20/15 88.451 kg (195 lb)  05/02/15 88.792 kg (195 lb 12 oz)    Physical Exam:  BP 170/94 mmHg  Pulse 71  Temp(Src) 97.9 F (36.6 C) (Oral)  Resp 18  Ht 5\' 10"  (1.778 m)  Wt 84.3 kg (185 lb 13.6 oz)  BMI 26.67 kg/m2  SpO2 97% Constitutional: He appears well-developed and well-nourished. NAD. HENT: fair dentition, oral mucosa pink and moist Head: Normocephalic and atraumatic.  Eyes: Conjunctivae and EOM are normal.  Cardiovascular: Normal rate and regular rhythm.  Respiratory: Effort normal and breath sounds normal. No respiratory distress.  GI: Soft. Bowel sounds are normal. He exhibits no distension.  Musculoskeletal: He exhibits no edema or tenderness.  Neurological: He is alert and oriented.  Patient is hard of hearing.  Left facial weakness (baseline) Motor: B/l UE 4+/5 delt,bic,tric,wrist,hand RLE: 4+/5 proximal to distal  LLE: Hip flexion 3+/5, knee extension 4/5, ankle dorsi/plantar flexion 4+/5  Skin: Skin is warm and dry.  Psychiatric: He has a normal mood and affect. His behavior is normal  Assessment/Plan: 1. 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 Physiatrist is providing close team supervision and 24 hour management of active medical problems listed below. Physiatrist and rehab team continue to assess barriers to discharge/monitor patient progress toward functional and medical goals.  Function:  Bathing Bathing position Bathing activity did not occur: Refused Position: Production manager parts bathed by patient: Right arm, Left arm, Chest, Abdomen, Front perineal area, Buttocks, Right upper leg, Left lower leg, Right lower leg, Left upper leg Body parts bathed by helper: Back, Left lower leg, Right lower leg  Bathing assist Assist Level: Supervision or verbal cues      Upper Body Dressing/Undressing Upper body dressing Upper body dressing/undressing activity did not occur: Refused What is the patient wearing?: Pull over shirt/dress     Pull over shirt/dress - Perfomed by patient: Thread/unthread right sleeve, Thread/unthread left sleeve, Put head through opening, Pull shirt over trunk          Upper body assist Assist Level: More than reasonable time   Set up : To obtain clothing/put away  Lower Body Dressing/Undressing Lower body dressing Lower body dressing/undressing activity did not occur: Refused What is the patient wearing?: Underwear, Pants, Socks, Shoes Underwear - Performed by patient: Thread/unthread right underwear leg, Thread/unthread left underwear leg, Pull underwear up/down   Pants- Performed by patient: Thread/unthread right pants leg, Thread/unthread left pants leg, Pull pants up/down   Non-skid slipper socks- Performed  by patient: Don/doff right sock, Don/doff left sock Non-skid slipper socks- Performed by helper: Don/doff right sock, Don/doff left sock Socks - Performed by patient: Don/doff right sock, Don/doff left sock   Shoes - Performed by  patient: Don/doff right shoe, Don/doff left shoe, Fasten right, Fasten left            Lower body assist Assist for lower body dressing: More than reasonable time      Toileting Toileting   Toileting steps completed by patient: Adjust clothing prior to toileting, Performs perineal hygiene, Adjust clothing after toileting Toileting steps completed by helper: Adjust clothing after toileting Toileting Assistive Devices: Grab bar or rail  Toileting assist Assist level: Supervision or verbal cues   Transfers Chair/bed transfer   Chair/bed transfer method: Stand pivot, Ambulatory Chair/bed transfer assist level: Supervision or verbal cues Chair/bed transfer assistive device: Armrests, Medical sales representative     Max distance: 168ft Assist level: Supervision or Psychologist, clinical activity did not occur: N/A Type: Manual Max wheelchair distance: 278ft Assist Level: No help, No cues, assistive device, takes more than reasonable amount of time  Cognition Comprehension Comprehension assist level: Understands complex 90% of the time/cues 10% of the time  Expression Expression assist level: Expresses complex ideas: With extra time/assistive device  Social Interaction Social Interaction assist level: Interacts appropriately with others - No medications needed.  Problem Solving Problem solving assist level: Solves basic 90% of the time/requires cueing < 10% of the time  Memory Memory assist level: Recognizes or recalls 90% of the time/requires cueing < 10% of the time    Medical Problem List and Plan: 1. 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 DVT Prophylaxis/Anticoagulation: SCDs. Monitor for any signs of DVT 3. Pain Management: Tylenol as needed 4. Mood: Prozac 10 mg daily 5. Neuropsych: This patient is capable of making decisions on his own behalf. 6. Skin/Wound Care: Routine skin  checks 7. Fluids/Electrolytes/Nutrition: Routine I&O with follow-up chemistries, 100%meals, 891ml fluids 8. Diastolic congestive heart failure. Lasix 40 mg daily. No signs of fluid overload. Weight 80kg, question accuracy given divergence from other recent values 9. Hypertension. Allow permissive hypertension. Patient on Imdur 90 mg daily, Toprol 25 mg daily prior to admission. Resume as needed  elevated within last 24 hours, if this persists will consider increasing medications Filed Vitals:   07/01/15 1354 07/02/15 0437  BP: 120/61 170/94  Pulse: 76 71  Temp: 98.3 F (36.8 C) 97.9 F (36.6 C)  Resp: 18 18   10. CAD status post CABG. Continue aspirin 11. Hyperlipidemia. Pravachol 12. History of alcohol use. Patient consumes approximately 4.2 ounces of alcohol per week.  do not anticipate withdrawal with reported usage 13. Dry eyes: Patient to bring drops from home. 14.  Urinary incont- UA neg, trial low dose myrbetriq, pt would like to try at home   LOS (Days) 8 A FACE TO FACE EVALUATION WAS PERFORMED  Andrew [name] T 07/02/2015 8:33 AM

## 2015-07-02 NOTE — Progress Notes (Signed)
Physical Therapy Session Note  Patient Details  Name: Andrew [name] MRN: [id number] Date of Birth: Jan 25, 1923  Today's Date: 07/02/2015 PT Individual Time: 1102-1140 PT Individual Time Calculation (min): 38 min   Short Term Goals: Week 1:  PT Short Term Goal 1 (Week 1): STG=LTG due to ELOS.   Skilled Therapeutic Interventions/Progress Updates:     Patient received sitting in recliner and agreeable to PT with wife and daughter present.  PT assisted patient to don Foot up brace and educated patient how to apply in sitting position.   Patient performed 2 bouts of stair training with supervision A from PT on first bout and supervision A from wife on second bout. PT educated wife and daughter and proper RW placement of transition onto and off of steps. Cues also provided for proper positioning below patient for increased support and how to cue patient for proper step to gait pattern. PT also instructed patient in problem solving for potential LOB.   Patient instructed in Car transfer with RW with supervision A from PT and once with supervision from wife. PT provided min cues for wife for proper cues for patient to increase safety with bracing UE on stable surface.   Gait training on uneven surface for 10 feet with RW, supervision A from PT and min cues for step to gait pattern and Ad management to prevent anterior LOB.   Ramp and curb training with supervision A from PT and RW, as well as cues for proper AD management and step to gait pattern to improve stability.   Patient left in [facility], with call bell within reach and all other needs met.      Therapy Documentation Precautions:  Precautions Precautions: Fall Precaution Comments: macular degeneration Restrictions Weight Bearing Restrictions: No     See Function Navigator for Current Functional Status.   Therapy/Group: Individual Therapy  Lorie Phenix 07/02/2015, 7:42 PM

## 2015-07-04 ENCOUNTER — Telehealth: Payer: Self-pay

## 2015-07-04 NOTE — Telephone Encounter (Signed)
Individual who answered call requested a future call. Pt was unable to speak at this time.

## 2015-07-04 NOTE — Telephone Encounter (Signed)
2nd attempt to complete TCM.   Transition Care Management Follow-up Telephone Call     Date discharged? 07/02/2015        How have you been since you were released from the hospital? Recovering   Any patient concerns? None    Do you understand why you were in the hospital? Yes   Do you understand the discharge instructions? Yes   Where were you discharged to? Home; was in rehab from 5/31-6/10   Items Reviewed:  Medications reviewed: Yes  Allergies reviewed: Yes  Dietary changes reviewed: No    Referrals reviewed: No   Functional Questionnaire:  Independent - I Dependent - D    Activities of Daily Living (ADLs):    Personal hygiene - I Dressing - I Eating - I Maintaining continence - I Transferring - uses walker for ambulation  Independent Activities of Daily Living (ADLs): Basic communication skills - I Transportation - does not drive Meal preparation - spouse does this   Shopping - spouse does this Housework - spouse does this Managing medications - spouse does this Managing personal finances - spouse does this    Confirmed importance and date/time of follow-up visits scheduled YES  Provider Appointment booked with PCP 07/11/2015 @ 1400  Confirmed with patient if condition begins to worsen call PCP or go to the ER.  Patient was given the office number and encouraged to call back with question or concerns: YES

## 2015-07-05 ENCOUNTER — Ambulatory Visit: Payer: Medicare Other | Attending: Physical Medicine & Rehabilitation | Admitting: Occupational Therapy

## 2015-07-05 ENCOUNTER — Encounter: Payer: Self-pay | Admitting: Occupational Therapy

## 2015-07-05 DIAGNOSIS — M6281 Muscle weakness (generalized): Secondary | ICD-10-CM | POA: Diagnosis present

## 2015-07-05 DIAGNOSIS — R2681 Unsteadiness on feet: Secondary | ICD-10-CM | POA: Insufficient documentation

## 2015-07-05 DIAGNOSIS — R2689 Other abnormalities of gait and mobility: Secondary | ICD-10-CM | POA: Diagnosis present

## 2015-07-05 DIAGNOSIS — R278 Other lack of coordination: Secondary | ICD-10-CM | POA: Diagnosis present

## 2015-07-05 NOTE — Therapy (Signed)
[facility] [address] [place], [place], [address] Phone: [phone]   Fax:  [phone]  Occupational Therapy Evaluation  Patient Details  Name: Andrew [name] MRN: [id number] Date of Birth: 1923-10-01 Referring Provider: Dr. Danae Chen  Encounter Date: 07/05/2015      OT End of Session - 07/05/15 1722    Visit Number 1   Number of Visits 8   Date for OT Re-Evaluation 08/02/15   Authorization Type [employer] medicare - will need G code and PN every 10th visit   Authorization Time Period 60 days   Authorization - Visit Number 1   Authorization - Number of Visits 8   OT Start Time [id number]   OT Stop Time 1358   OT Time Calculation (min) 43 min   Activity Tolerance Patient tolerated treatment well      Past Medical History  Diagnosis Date  . HYPERCHOLESTEROLEMIA   . CAD, ARTERY BYPASS GRAFT   . CAROTID ARTERY STENOSIS 03/07/2010    80%  . AORTIC STENOSIS   . GASTROESOPHAGEAL REFLUX DISEASE   . Skin cancer   . History of colonic polyps     1999, 2004  . Allergic rhinitis   . Thyroid nodule 05/2010    Abnormal biopsy, 82 year old patient and his wife have made informed decision to not pursue surgical resection. Potential risk including cancer has been thoroughly discussed with the patient.  . S/P excision of acoustic neuroma   . History of prostate cancer 2002    s/p treatment with seeds / radiation  . Lumbar spinal stenosis 07/13/2009  . COPD, mild ([place]) 12/22/2010  . Pancreatic cyst 01/29/2013    Noted on MRI scan from [facility] on 07/27/2011.  I reviewed report at patient request on 01-29-2013.  "Small cystic focus in the posterior pancreatic head.  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  Follow up MRI is advised. The main pancreatic duct remains normal in appearance."  Patient do  . CHF (congestive heart failure)  ([place])   . Macular degeneration (senile) of retina 04/29/2014    Past Surgical History  Procedure Laterality Date  . Coronary artery bypass graft    . Transperineal implatation of palladium    . Cholecystectomy    . Shoulder surgery    . Total knee arthroplasty    . Cataract extraction    . Tonsillectomy and adenoidectomy  1932  . Craniectomy for excision of acoustic neuroma  1985  . Cataract extraction  2010  . Eye surgery      There were no vitals filed for this visit.      Subjective Assessment - 07/05/15 1323    Patient is accompained by: Family member  wife   Pertinent History see epic snapshot - pt reports he gets SOB and occassional chest pain with exercise. Pt with signficant macular degeneration, L eye worse than R   Patient Stated Goals to get back to where I can walk with my cane by myself.    Currently in Pain? No/denies  pt states he can get angina with exercise and pain in his back from arthritis/spinal stenosis especially with execise.           [facility] OT Assessment - 07/05/15 0001    Assessment   Diagnosis R CVA   Referring Provider Dr. Danae Chen   Onset Date 06/20/15   Prior Therapy Inpt PT  and OT   Precautions   Precautions Fall   Restrictions   Weight Bearing Restrictions No   Balance Screen   Has the patient fallen in the past 6 months No  Pt to have PT eval today   Home  Environment   Family/patient expects to be discharged to: Private residence   Living Arrangements Spouse/significant other  dtr in [place] and one in [place] as well as grandson   Available Help at Discharge Available 24 hours/day   Type of [place] One level   Bathroom Shower/Tub Tub/Shower unit   Bathroom Toilet Handicapped height   Additional Comments Pt has bench, grab bar around toilet as well as shower tranfer tub bench,    Prior Function   Level of [place] Retired   ADL   Eating/Feeding Independent   Grooming Modified  independent   Scientist, clinical (histocompatibility and immunogenetics) Supervision/safety   Lower Body Bathing Supervision/safety   Upper Body Dressing Set up   Lower Body Dressing Moderate assistance  assist to get pants and underwear over feet due to balance   Toilet Tranfer Supervision/safety   Toileting - Clothing Manipulation Modified independent   [place] Transfer Supervision/safety   IADL   Shopping Completely unable to shop   Light Housekeeping --  n/a pt did not do this before the stroke   Meal Prep Needs to have meals prepared and served   Medication Management Is not capable of dispensing or managing own medication  due to vision, memory   Financial Management Requires assistance   Mobility   Mobility Status Needs assist   Mobility Status Comments superivsion/occassional min a with walker   Written Expression   Dominant Hand Right   Vision - History   Baseline Vision Other (comment)   Visual History Macular degeneration   Additional Comments Pt sees very little in L eye and mostly peripheral in R eye.  Pt reports that vision is worse since the stroke - more blurry but he feels it is improving.  Pt has not driven in 5 years due to vision issues.   Vision Assessment   Eye Alignment Within Functional Limits   Ocular Range of Motion Within Functional Limits   Visual Fields No apparent deficits   Activity Tolerance   Activity Tolerance Tolerates 30 min activity with muliple rests   Cognition   Overall Cognitive Status Within Functional Limits for tasks assessed  will continue to monitor   [place] Exam  Unable to do MOCA due to vision issues.  Pt does appear to ask wife for clairification however pt and wife both deny any real changes.   Sensation   Light Touch Appears Intact   Proprioception Appears Intact   Coordination   Gross Motor Movements are Fluid and Coordinated Yes   Fine Motor Movements are Fluid and Coordinated No   Other Unable to do 9  hole peg test due macular degeneration. Pt with decreased coordination with thumb to fingers and pt reports decreased coordination when attempting activities.    Tone   Assessment Location Left Upper Extremity   ROM / Strength   AROM / PROM / Strength AROM;Strength   AROM   Overall AROM  Within functional limits for tasks performed   Overall AROM Comments BUE's   Strength   Overall Strength Within functional limits for tasks performed  except mildly decreased grip strength   Hand Function  Right Hand Gross Grasp Functional   Right Hand Grip (lbs) 80   Left Hand Gross Grasp Impaired  mildly   Left Hand Grip (lbs) 60   LUE Tone   LUE Tone Within Functional Limits                           OT Short Term Goals - 07/05/15 1713    OT SHORT TERM GOAL #1   Title n/a           OT Long Term Goals - 07/05/15 1713    OT LONG TERM GOAL #1   Title Pt and wife will be mod I with HEP - 08/02/2015   Status New   OT LONG TERM GOAL #2   Title Pt will improve grip strength in L hand by 5 pounds (baseline=60)   Status New   OT LONG TERM GOAL #3   Title Pt will be mod I with toilet transfers   Status New   OT LONG TERM GOAL #4   Title Pt will be mod I with bathing at seated position   Status New   OT LONG TERM GOAL #5   Title Pt will be min a with LB dresssing   Status New   Long Term Additional Goals   Additional Long Term Goals Yes   OT LONG TERM GOAL #6   Title Pt will demonstrate ability for simple functional tasks requring bilateral manipulation of objects   Status New               Plan - 07/05/15 1716    Clinical Impression Statement Pt is a 82 year old male s/p R CVA - pt was hospitalized on 06/20/2015 and dscharged home from inpt rehab on 07/02/2015.  Pt PMH includes:  aortic stenosis, angina with activity, GERD, CHF, CABG, spinal stenosis, OA, prior CVA, chronic renal insufficiency, advanced macular degeneration.  Pt presents today with the  following impairments that impact his abilty to complete basic ADL's, IADL's,  and keisure activities:  decreased balance, decreased grip strength L hand, decreased coordination L hand, decreased activity tolerance, . Pt will benefit from skilled OT to address these deficits and maximize independence.     Rehab Potential Good   OT Frequency 2x / week   OT Duration 4 weeks   OT Treatment/Interventions Self-care/ADL training;Moist Heat;DME and/or AE instruction;Energy conservation;Neuromuscular education;Therapeutic exercise;Functional Mobility Training;Manual Therapy;Therapeutic activities;Patient/family education;Balance training   Plan initiate HEP   Consulted and Agree with Plan of Care Patient;Family member/caregiver   Family Member Consulted wife      Patient will benefit from skilled therapeutic intervention in order to improve the following deficits and impairments:  Abnormal gait, Decreased activity tolerance, Decreased balance, Decreased coordination, Decreased mobility, Decreased knowledge of use of DME, Decreased strength, Difficulty walking, Impaired UE functional use, Pain  Visit Diagnosis: Unsteadiness on feet - Plan: Ot plan of care cert/re-cert  Other abnormalities of gait and mobility - Plan: Ot plan of care cert/re-cert  Other lack of coordination - Plan: Ot plan of care cert/re-cert  Muscle weakness (generalized) - Plan: Ot plan of care cert/re-cert      G-Codes - [id number]    Functional Assessment Tool Used clinical observation    Functional Limitation Self care   Self Care Current Status [id number]) At least 40 percent but less than 60 percent impaired, limited or restricted   Self Care Goal Status [id number]) At least 20 percent but  less than 40 percent impaired, limited or restricted      Problem List Patient Active Problem List   Diagnosis Date Noted  . Essential hypertension   . Dry eyes   . Left hemiparesis ([place]) 06/24/2015  . Alcohol abuse   . Benign  essential HTN   . Coronary artery disease involving coronary bypass graft of native heart without angina pectoris   . Bradycardia   . Thrombocytopenia ([place])   . Right-sided cerebrovascular accident (CVA) ([place])   . CVA (cerebral infarction) 06/20/2015  . Stroke ([place]) 06/20/2015  . Macrocytosis without anemia 06/20/2015  . Hyperbilirubinemia 06/20/2015  . Cerebral infarction due to unspecified mechanism   . Chronic renal insufficiency, stage III (moderate) 06/02/2014  . Macular degeneration (senile) of retina 04/29/2014  . 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 Major depressive disorder, single episode 06/11/2013  . TIA (transient ischemic attack) 06/03/2013  . Pancreatic cyst 01/29/2013  . OSA (obstructive sleep apnea) 12/26/2012  . Chronic diastolic CHF (congestive heart failure) ([place]) 08/19/2012  . COPD, minimal-mild 12/22/2010  . S/P excision of acoustic neuroma 06/18/2010  . Pulmonary nodule 06/17/2010  . Vertigo 06/14/2010  . THYROID NODULE 03/13/2010  . PVD- moderate carotid disease 03/07/2010  . Spinal stenosis, lumbar region, with neurogenic claudication 07/13/2009  . ALLERGIC RHINITIS 09/01/2008  . COLONIC POLYPS, HX OF 09/01/2008  . HYPERCHOLESTEROLEMIA 06/18/2008  . Angina, class II ([place]) 06/18/2008  . Hx of CABG 06/18/2008  . Moderate aortic stenosis 06/18/2008  . GASTROESOPHAGEAL REFLUX DISEASE 06/18/2008    Quay Burow, OTR/L 07/05/2015, 5:27 PM  [facility] [address] [place], [place], [address] Phone: [phone]   Fax:  [phone]  Name: Andrew [name] MRN: [id number] Date of Birth: October 03, 1923

## 2015-07-08 ENCOUNTER — Encounter: Payer: Self-pay | Admitting: Rehabilitation

## 2015-07-08 ENCOUNTER — Ambulatory Visit: Payer: Medicare Other | Admitting: Rehabilitation

## 2015-07-08 DIAGNOSIS — R2689 Other abnormalities of gait and mobility: Secondary | ICD-10-CM

## 2015-07-08 DIAGNOSIS — R2681 Unsteadiness on feet: Secondary | ICD-10-CM

## 2015-07-08 DIAGNOSIS — M6281 Muscle weakness (generalized): Secondary | ICD-10-CM

## 2015-07-08 DIAGNOSIS — R278 Other lack of coordination: Secondary | ICD-10-CM

## 2015-07-09 NOTE — Therapy (Signed)
[facility] [address] [place], [place], [address] Phone: [phone]   Fax:  [phone]  Physical Therapy Evaluation  Patient Details  Name: Andrew [name] MRN: [id number] Date of Birth: 11-07-1923 Referring Provider: Alysia Penna, MD  Encounter Date: 07/08/2015      PT End of Session - 07/09/15 0911    Visit Number 1   Number of Visits 17   Date for PT Re-Evaluation 09/06/15   Authorization Type [employer] MCR-G Code every 10th visit   PT Start Time [id number]   PT Stop Time 1450   PT Time Calculation (min) 45 min   Activity Tolerance Patient tolerated treatment well   Behavior During Therapy [facility] for tasks assessed/performed      Past Medical History  Diagnosis Date  . HYPERCHOLESTEROLEMIA   . CAD, ARTERY BYPASS GRAFT   . CAROTID ARTERY STENOSIS 03/07/2010    80%  . AORTIC STENOSIS   . GASTROESOPHAGEAL REFLUX DISEASE   . Skin cancer   . History of colonic polyps     1999, 2004  . Allergic rhinitis   . Thyroid nodule 05/2010    Abnormal biopsy, 82 year old patient and his wife have made informed decision to not pursue surgical resection. Potential risk including cancer has been thoroughly discussed with the patient.  . S/P excision of acoustic neuroma   . History of prostate cancer 2002    s/p treatment with seeds / radiation  . Lumbar spinal stenosis 07/13/2009  . COPD, mild ([place]) 12/22/2010  . Pancreatic cyst 01/29/2013    Noted on MRI scan from [facility] on 07/27/2011.  I reviewed report at patient request on 01-29-2013.  "Small cystic focus in the posterior pancreatic head.  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  Follow up MRI is advised. The main pancreatic duct remains normal in appearance."  Patient do  . CHF (congestive heart failure) ([place])   . Macular degeneration (senile) of retina 04/29/2014     Past Surgical History  Procedure Laterality Date  . Coronary artery bypass graft    . Transperineal implatation of palladium    . Cholecystectomy    . Shoulder surgery    . Total knee arthroplasty    . Cataract extraction    . Tonsillectomy and adenoidectomy  1932  . Craniectomy for excision of acoustic neuroma  1985  . Cataract extraction  2010  . Eye surgery      There were no vitals filed for this visit.       Subjective Assessment - 07/08/15 1410    Subjective "I was at [facility] for two weeks and when I got out, they recommended it."    Patient is accompained by: Family member  wife, Rod Holler   Limitations Walking;House hold activities   Patient Stated Goals "I want to get rid of the walker and get back to the cane."    Currently in Pain? No/denies  has pain in low back from arthritis and stenosis in low back            [facility] PT Assessment - 07/08/15 1412    Assessment   Medical Diagnosis R CVA   Referring Provider Alysia Penna, MD   Onset Date/Surgical Date 06/20/15   Hand Dominance Right   Precautions   Precautions Fall   Restrictions   Weight Bearing Restrictions No   Balance Screen  Has the patient fallen in the past 6 months No   Has the patient had a decrease in activity level because of a fear of falling?  No   Is the patient reluctant to leave their home because of a fear of falling?  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, liked to take care of yard   [employer]   Overall Cognitive Status  Within Functional Limits for tasks assessed   Sensation   Light Touch Appears Intact   Hot/Cold Appears Intact   Proprioception Appears Intact   Coordination   Gross Motor Movements are Fluid and Coordinated No   Fine Motor Movements are Fluid and Coordinated No   Heel Shin Test decreased fluidity in LLE   ROM / Strength   AROM / PROM / Strength Strength   Strength   Overall Strength Deficits   Overall Strength Comments L hip flex 3/5 (more uncoordinated movement, therefore inconsistent with testing), L knee flex 3+/5, L ankle DF 3+/5, all others grossly WFL   Transfers   Transfers Sit to Stand;Stand to Sit   Sit to Stand 6: Modified independent (Device/Increase time)   Five time sit to stand comments  32.09 without use of hands   Stand to Sit 6: Modified independent (Device/Increase time)   Ambulation/Gait   Ambulation/Gait Yes   Ambulation/Gait Assistance 5: Supervision   Ambulation/Gait Assistance Details Min cues for posture and attention to LLE   Ambulation Distance (Feet) 115 Feet   Assistive device Rolling walker   Gait Pattern Decreased stride length;Step-through pattern;Trunk flexed;Wide base of support;Poor foot clearance - left   Ambulation Surface Level;Indoor   Gait velocity .95 ft/sec   Stairs Yes   Stairs Assistance 5: Supervision   Stair Management Technique Two rails;Step to pattern;Forwards   Number of Stairs 4   Height of Stairs 6   Standardized Balance Assessment   Standardized Balance Assessment Berg Balance Test   Berg Balance Test   Sit to Stand Able to stand  independently using hands   Standing Unsupported Able to stand safely 2 minutes   Sitting with Back Unsupported but Feet Supported on Floor or Stool Able to sit safely and securely 2 minutes   Stand to Sit Uses backs of legs against chair to control descent   Transfers Able to transfer safely, definite need of hands   Standing Unsupported with Eyes Closed Able to stand 3 seconds   Standing  Ubsupported with Feet Together Able to place feet together independently and stand for 1 minute with supervision   From Standing, Reach Forward with Outstretched Arm Reaches forward but needs supervision   From Standing Position, Pick up Object from Floor Unable to pick up and needs supervision   From Standing Position, Turn to Look Behind Over each Shoulder Needs supervision when turning   Turn 360 Degrees Needs assistance while turning   Standing Unsupported, Alternately Place Feet on Step/Stool Able to complete >2 steps/needs minimal assist   Standing Unsupported, One Foot  in [place] to plae foot ahead of the other independently and hold 30 seconds   Standing on One Leg Unable to try or needs assist to prevent fall   Total Score 28   Berg comment: < 36 high risk for falls (close to 100%)                           PT Education - 07/09/15 0910    Education provided Yes   Education Details Educated on POC, goals, and BERG results   Person(s) Educated Patient;Spouse   Methods Explanation   Comprehension Verbalized understanding          PT Short Term Goals - 07/09/15 0918    PT SHORT TERM GOAL #1   Title Pt will initiate HEP in order to improve functional mobility and decrease fall risk.  (Target Date: 08/05/15)   Status New   PT SHORT TERM GOAL #2   Title Pt will improve BERG balance test to 32/56 in order to indicate decreased fall risk.     Status New   PT SHORT TERM GOAL #3   Title Pt will improve gait speed to 1.55 ft/sec in order to indicate decreased fall risk and improved efficiency of gait.     Status New   PT SHORT TERM GOAL #4   Title Pt will verbalize understanding of CVA warning signs and risk factors in order to decrease time to seeking medical attention in case of future stroke.     Status New   PT SHORT TERM GOAL #5   Title Pt will ambulate 200' w/ LRAD without foot up brace at mod I level in order to indicate improved strength and attention  to LLE when walking over indoor surfaces.            PT Long Term Goals - 07/09/15 [id number]    PT LONG TERM GOAL #1   Title Pt will be independent with HEP in order to indicate improved functional mobility and decreased fall risk.  (Target Date: 09/02/15)   PT LONG TERM GOAL #2   Title Pt will improve BERG balance score to >36/56 in order to indicate decreased fall risk.     Status New   PT LONG TERM GOAL #3   Title Pt will improve gait speed to 2.15 ft/sec in order to indicate decreased fall risk and improved efficiency of gait, as well as limited community ambulator.    PT LONG TERM GOAL #4   Title Pt will ambulate over indoor surfaces w/ SPC without foot up brace at mod I level in order to indicate increased independence with household ambulation.     PT LONG TERM GOAL #5   Title Pt will ambulate over paved outdoor surfaces (including ramp and curb) w/ SPC without foot up brace at mod I level in order to indicate increased independence in community.                 Plan - 07/09/15 0913    Clinical Impression Statement Pt presents s/p R CVA on 06/20/15 resulting in decreased strength in LLE, decreased coordination, and decreased balance.  Note history of macular degeneration, acoustic neuroma, and spinal stenosis that could impact progress with therapy.  Upon PT evaluation, note BERG balance score to be 28/56, indicative of significant fall risk, gait speed of .95 ft/sec, also indicative of recurrent falls and decreased coordination.  Pt is of evolving presentation and of moderate  complexity per PT POC.  Pt will  benefit from skilled OP neuro PT in order to address deficits.     Rehab Potential Good   Clinical Impairments Affecting Rehab Potential hx of macular degeneration and acoustic neuroma   PT Frequency 2x / week   PT Duration 8 weeks   PT Treatment/Interventions ADLs/Self Care Home Management;Electrical Stimulation;DME Instruction;Gait training;Stair training;Functional mobility  training;Therapeutic activities;Therapeutic exercise;Balance training;Neuromuscular re-education;Patient/family education;Orthotic Fit/Training;Energy conservation;Vestibular;Visual/perceptual remediation/compensation   PT Next Visit Plan Look at vestibular deficits more closely (hx of acoustic neuroma), also est HEP for balance and LLE strengthening, gait w/ SPC   Consulted and Agree with Plan of Care Patient;Family member/caregiver   Family Member Consulted Rod Holler      Patient will benefit from skilled therapeutic intervention in order to improve the following deficits and impairments:  Abnormal gait, Decreased activity tolerance, Decreased balance, Decreased coordination, Decreased endurance, Decreased mobility, Decreased strength, Difficulty walking, Dizziness, Impaired perceived functional ability, Impaired UE functional use, Impaired vision/preception, Improper body mechanics, Postural dysfunction  Visit Diagnosis: Unsteadiness on feet - Plan: PT plan of care cert/re-cert  Other abnormalities of gait and mobility - Plan: PT plan of care cert/re-cert  Muscle weakness (generalized) - Plan: PT plan of care cert/re-cert  Other lack of coordination - Plan: PT plan of care cert/re-cert      G-Codes - [id number]    Functional Assessment Tool Used BERG: 28/56   Functional Limitation Mobility: Walking and moving around   Mobility: Walking and Moving Around Current Status [id number]) At least 40 percent but less than 60 percent impaired, limited or restricted   Mobility: Walking and Moving Around Goal Status [phone]) At least 20 percent but less than 40 percent impaired, limited or restricted       Problem List Patient Active Problem List   Diagnosis Date Noted  . Essential hypertension   . Dry eyes   . Left hemiparesis ([place]) 06/24/2015  . Alcohol abuse   . Benign essential HTN   . Coronary artery disease involving coronary bypass graft of native heart without angina pectoris   .  Bradycardia   . Thrombocytopenia ([place])   . Right-sided cerebrovascular accident (CVA) ([place])   . CVA (cerebral infarction) 06/20/2015  . Stroke ([place]) 06/20/2015  . Macrocytosis without anemia 06/20/2015  . Hyperbilirubinemia 06/20/2015  . Cerebral infarction due to unspecified mechanism   . Chronic renal insufficiency, stage III (moderate) 06/02/2014  . Macular degeneration (senile) of retina 04/29/2014  . 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 Major depressive disorder, single episode 06/11/2013  . TIA (transient ischemic attack) 06/03/2013  . Pancreatic cyst 01/29/2013  . OSA (obstructive sleep apnea) 12/26/2012  . Chronic diastolic CHF (congestive heart failure) ([place]) 08/19/2012  . COPD, minimal-mild 12/22/2010  . S/P excision of acoustic neuroma 06/18/2010  . Pulmonary nodule 06/17/2010  . Vertigo 06/14/2010  . THYROID NODULE 03/13/2010  . PVD- moderate carotid disease 03/07/2010  . Spinal stenosis, lumbar region, with neurogenic claudication 07/13/2009  . ALLERGIC RHINITIS 09/01/2008  . COLONIC POLYPS, HX OF 09/01/2008  . HYPERCHOLESTEROLEMIA 06/18/2008  . Angina, class II ([place]) 06/18/2008  . Hx of CABG 06/18/2008  . Moderate aortic stenosis 06/18/2008  . GASTROESOPHAGEAL REFLUX DISEASE 06/18/2008    Cameron Sprang, PT, MPT [facility] [address] [place], [place], [address] Phone: [phone]   Fax:  [phone] 07/09/2015, 9:32 AM  Name: Andrew [name] MRN: [id number] Date of Birth: [birth year]

## 2015-07-11 ENCOUNTER — Ambulatory Visit (INDEPENDENT_AMBULATORY_CARE_PROVIDER_SITE_OTHER): Payer: Medicare Other | Admitting: Family Medicine

## 2015-07-11 ENCOUNTER — Encounter: Payer: Self-pay | Admitting: Family Medicine

## 2015-07-11 VITALS — BP 120/70 | HR 62 | Temp 98.6°F | Ht 69.0 in | Wt 191.2 lb

## 2015-07-11 DIAGNOSIS — I639 Cerebral infarction, unspecified: Secondary | ICD-10-CM | POA: Diagnosis not present

## 2015-07-11 DIAGNOSIS — N183 Chronic kidney disease, stage 3 unspecified: Secondary | ICD-10-CM

## 2015-07-11 DIAGNOSIS — E441 Mild protein-calorie malnutrition: Secondary | ICD-10-CM

## 2015-07-11 DIAGNOSIS — Z5189 Encounter for other specified aftercare: Secondary | ICD-10-CM | POA: Diagnosis not present

## 2015-07-11 DIAGNOSIS — N189 Chronic kidney disease, unspecified: Secondary | ICD-10-CM

## 2015-07-11 DIAGNOSIS — D696 Thrombocytopenia, unspecified: Secondary | ICD-10-CM

## 2015-07-11 LAB — BASIC METABOLIC PANEL
BUN: 24 mg/dL — ABNORMAL HIGH (ref 6–23)
CALCIUM: 8.9 mg/dL (ref 8.4–10.5)
CHLORIDE: 101 meq/L (ref 96–112)
CO2: 28 mEq/L (ref 19–32)
CREATININE: 1.32 mg/dL (ref 0.40–1.50)
GFR: 54.11 mL/min — ABNORMAL LOW (ref >60.00)
Glucose, Bld: 94 mg/dL (ref 70–99)
Potassium: 4.4 mEq/L (ref 3.5–5.1)
Sodium: 137 mEq/L (ref 135–145)

## 2015-07-11 LAB — HEPATIC FUNCTION PANEL
ALT: 34 U/L (ref 0–53)
AST: 36 U/L (ref 0–37)
Albumin: 3.9 g/dL (ref 3.5–5.2)
Alkaline Phosphatase: 109 U/L (ref 39–117)
BILIRUBIN DIRECT: 0.2 mg/dL (ref 0.0–0.3)
BILIRUBIN TOTAL: 0.7 mg/dL (ref 0.2–1.2)
Total Protein: 6.8 g/dL (ref 6.0–8.3)

## 2015-07-11 LAB — CBC WITH DIFFERENTIAL/PLATELET
BASOS ABS: 0 10*3/uL (ref 0.0–0.1)
Basophils Relative: 0.4 % (ref 0.0–3.0)
Eosinophils Absolute: 0.2 10*3/uL (ref 0.0–0.7)
Eosinophils Relative: 3.7 % (ref 0.0–5.0)
HCT: 43.2 % (ref 39.0–52.0)
Hemoglobin: 14.7 g/dL (ref 13.0–17.0)
LYMPHS ABS: 1.9 10*3/uL (ref 0.7–4.0)
Lymphocytes Relative: 29.7 % (ref 12.0–46.0)
MCHC: 34.1 g/dL (ref 30.0–36.0)
MCV: 102.3 fl — AB (ref 78.0–100.0)
MONOS PCT: 9.8 % (ref 3.0–12.0)
Monocytes Absolute: 0.6 10*3/uL (ref 0.1–1.0)
NEUTROS ABS: 3.5 10*3/uL (ref 1.4–7.7)
NEUTROS PCT: 56.4 % (ref 43.0–77.0)
Platelets: 147 10*3/uL — ABNORMAL LOW (ref 150.0–400.0)
RBC: 4.22 Mil/uL (ref 4.22–5.81)
RDW: 13.4 % (ref 11.5–15.5)
WBC: 6.2 10*3/uL (ref 4.0–10.5)

## 2015-07-11 NOTE — Progress Notes (Signed)
Pre visit review using our clinic review tool, if applicable. No additional management support is needed unless otherwise documented below in the visit note. 

## 2015-07-11 NOTE — Progress Notes (Signed)
Dr. Frederico Hamman T. Mateya Torti, MD, [facility] [place], [address] Phone: [phone] Fax: [phone]  07/11/2015  Patient: Andrew [name], MRN: [id number], DOB: August 16, 1923, 82 y.o.  Primary Physician:  [name], MD   Chief Complaint  Patient presents with  . Hospitalization Follow-up   Subjective:   Andrew [name] is a 82 y.o. very pleasant male patient who presents with the following:  L leg was not working. They waited for a number of hours, and they then thought that they should take him to the hospital and he was found to have a right sided CVA.  Admit date: 06/20/2015 Discharge date: 06/24/2015  Date of discharge from inpatient rehabilitation, 07/02/2015.  Additionally he had worsening renal function in the setting of baseline stage III chronic kidney disease. Additionally the patient also had thrombocytopenia.  He was discharged on aspirin 325 milligrams a day.  Continue with coronary artery medications.  July, 2 days a week PT and OT back to back.  Eustace Pen, LPN at [id number] PM     Status: Signed       Expand All Collapse All   2nd attempt to complete TCM.   Transition Care Management Follow-up Telephone Call    Date discharged? 07/02/2015   How have you been since you were released from the hospital? Recovering   Any patient concerns? None   Do you understand why you were in the hospital? Yes   Do you understand the discharge instructions? Yes   Where were you discharged to? Home; was in rehab from 5/31-6/10         Past Medical History, Surgical History, Social History, Family History, Problem List, Medications, and Allergies have been reviewed and updated if relevant.  Patient Active Problem List   Diagnosis Date Noted  . Chronic diastolic CHF (congestive heart failure) ([place]) 08/19/2012    Priority: High  . PVD- moderate carotid disease 03/07/2010     Priority: High  . Angina, class II ([place]) 06/18/2008    Priority: High  . Hx of CABG 06/18/2008    Priority: High  . Moderate aortic stenosis 06/18/2008    Priority: High  . Macular degeneration (senile) of retina 04/29/2014    Priority: Medium  . TIA (transient ischemic attack) 06/03/2013    Priority: Medium  . COPD, minimal-mild 12/22/2010    Priority: Medium  . Spinal stenosis, lumbar region, with neurogenic claudication 07/13/2009    Priority: Medium  . Essential hypertension   . Left hemiparesis ([place]) 06/24/2015  . Alcohol abuse   . Benign essential HTN   . Coronary artery disease involving coronary bypass graft of native heart without angina pectoris   . Bradycardia   . Thrombocytopenia ([place])   . Right-sided cerebrovascular accident (CVA) ([place])   . CVA (cerebral infarction) 06/20/2015  . Stroke ([place]) 06/20/2015  . Macrocytosis without anemia 06/20/2015  . Hyperbilirubinemia 06/20/2015  . Cerebral infarction due to unspecified mechanism   . Chronic renal insufficiency, stage III (moderate) 06/02/2014  . 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 Major depressive disorder, single episode 06/11/2013  . Pancreatic cyst 01/29/2013  . OSA (obstructive sleep apnea) 12/26/2012  . S/P excision of acoustic neuroma 06/18/2010  . Pulmonary nodule 06/17/2010  . Vertigo 06/14/2010  . THYROID NODULE 03/13/2010  . ALLERGIC RHINITIS 09/01/2008  . COLONIC POLYPS, HX OF 09/01/2008  . HYPERCHOLESTEROLEMIA 06/18/2008  . GASTROESOPHAGEAL REFLUX DISEASE 06/18/2008    Past Medical History  Diagnosis Date  . HYPERCHOLESTEROLEMIA   . CAD, ARTERY BYPASS GRAFT   . CAROTID ARTERY STENOSIS 03/07/2010    80%  . AORTIC STENOSIS   . GASTROESOPHAGEAL REFLUX DISEASE   . Skin cancer   . History of colonic polyps     1999, 2004  . Allergic rhinitis   . Thyroid nodule 05/2010    Abnormal biopsy, 82 year old patient and his wife have made informed decision to not pursue surgical resection. Potential risk  including cancer has been thoroughly discussed with the patient.  . S/P excision of acoustic neuroma   . History of prostate cancer 2002    s/p treatment with seeds / radiation  . Lumbar spinal stenosis 07/13/2009  . COPD, mild ([place]) 12/22/2010  . Pancreatic cyst 01/29/2013    Noted on MRI scan from [facility] on 07/27/2011.  I reviewed report at patient request on 01-29-2013.  "Small cystic focus in the posterior pancreatic head.  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  Follow up MRI is advised. The main pancreatic duct remains normal in appearance."  Patient do  . CHF (congestive heart failure) ([place])   . Macular degeneration (senile) of retina 04/29/2014    Past Surgical History  Procedure Laterality Date  . Coronary artery bypass graft    . Transperineal implatation of palladium    . Cholecystectomy    . Shoulder surgery    . Total knee arthroplasty    . Cataract extraction    . Tonsillectomy and adenoidectomy  1932  . Craniectomy for excision of acoustic neuroma  1985  . Cataract extraction  2010  . Eye surgery      Social History   Social History  . Marital Status: Married    Spouse Name: N/A  . Number of Children: N/A  . Years of Education: N/A   Occupational History  . retired  At [employer]    and [employer] Topics  . Smoking status: Former Smoker -- 2.00 packs/day for 20 years    Types: Cigarettes    Quit date: 01/22/1961  . Smokeless tobacco: Never Used  . Alcohol Use: 4.2 oz/week    7 Shots of liquor per week     Comment: 1 drink nightly  . Drug Use: No  . Sexual Activity: Not on file   Other Topics Concern  . Not on file   Social History Narrative    Family History  Problem Relation Age of Onset  . Emphysema Brother   . Lung cancer Brother   . Alcohol abuse    . Arthritis    . Cancer    . Macular degeneration    .  Heart disease Father   . Hypertension Father   . Heart attack Father   . Colon cancer Sister   . Non-Hodgkin's lymphoma Sister   . Heart disease Brother   . Colon cancer Sister   . Skin cancer Sister   . Lung cancer Daughter     No Known Allergies  Medication list reviewed and updated in full in [place].   GEN: No acute illnesses, no fevers, chills. GI: No n/v/d, eating normally Pulm: No SOB Interactive and getting along well at home.  Otherwise, ROS is as per the HPI.  Objective:   BP 120/70 mmHg  Pulse 62  Temp(Src) 98.6 F (37 C) (Oral)  Ht  5\' 9"  (1.753 m)  Wt 191 lb 4 oz (86.75 kg)  BMI 28.23 kg/m2  GEN: WDWN, NAD, Non-toxic, A & O x 3 HEENT: Atraumatic, Normocephalic. Neck supple. No masses, No LAD. Ears and Nose: No external deformity. CV: RRR, 3/6 SEM. No JVD. No thrill. No extra heart sounds. PULM: CTA B, no wheezes, crackles, rhonchi. No retractions. No resp. distress. No accessory muscle use. EXTR: No c/c/e NEURO Normal gait - walker  Strengths appear grossly preserved in all limbs. Sensation appears preserved. Cranial nerves II through XII intact.  PSYCH: Normally interactive. Conversant. Not depressed or anxious appearing.  Calm demeanor.   Laboratory and Imaging Data: Mr Brain Wo Contrast (neuro Protocol)  06/20/2015  CLINICAL DATA:  82 year old male with left-sided deficits since yesterday. Fall twice since episode. History of vestibular schwannoma resection 1985. Initial encounter. EXAM: MRI HEAD WITHOUT CONTRAST TECHNIQUE: Multiplanar, multiecho pulse sequences of the brain and surrounding structures were obtained without intravenous contrast. COMPARISON:  05/04/2009. FINDINGS: Small acute nonhemorrhagic infarct posterior limb right internal capsule. Question tiny medial left parietal lobe infarct. Marked chronic microvascular changes. Global moderate atrophy without hydrocephalus. No intracranial hemorrhage. Postsurgical changes left mastoid  region. No obvious recurrent mass. Major intracranial vascular structures are patent. Narrowing left vertebral artery suspected. Post lens replacement. Paranasal sinus mucosal thickening/opacification most notable right maxillary sinus. Cervical medullary junction within normal limits. IMPRESSION: Small acute nonhemorrhagic infarct posterior limb right internal capsule. Question tiny medial left parietal lobe infarct. Marked chronic microvascular changes. Global moderate atrophy without hydrocephalus. No intracranial hemorrhage. Postsurgical changes left mastoid region. No obvious recurrent mass. Major intracranial vascular structures are patent. Narrowing left vertebral artery suspected. Paranasal sinus mucosal thickening/opacification most notable right maxillary sinus. Electronically Signed   By: Genia Del M.D.   On: 06/20/2015 12:38   Dg Chest Port 1 View  06/21/2015  CLINICAL DATA:  Right rib and chest wall pain. Fall 2 days ago. Initial encounter. EXAM: PORTABLE CHEST 1 VIEW COMPARISON:  10/27/2014 FINDINGS: Low lung volumes again noted. No evidence of pneumothorax or hemothorax. No evidence of acute infiltrate. Heart size remains stable. Prior CABG noted. IMPRESSION: Low lung volumes.  No acute findings. Electronically Signed   By: Earle Gell M.D.   On: 06/21/2015 13:35   Mr Jodene Nam Head/brain Wo Cm  06/20/2015  CLINICAL DATA:  Stroke.  Acute left-sided weakness. EXAM: MRA HEAD WITHOUT CONTRAST TECHNIQUE: Angiographic images of the Circle of Willis were obtained using MRA technique without intravenous contrast. COMPARISON:  MRI 06/20/2015 FINDINGS: Image quality degraded by motion. This level of motion obscures detail in small vessels. Both vertebral arteries patent to the basilar. PICA patent bilaterally. Basilar is patent. Superior cerebellar arteries patent bilaterally. Fetal origin of the right posterior cerebral artery. Internal carotid artery is patent bilaterally without significant stenosis.  Anterior and middle cerebral arteries are patent bilaterally. No aneurysm identified IMPRESSION: Image quality degraded by mild to moderate motion. No large vessel occlusion. Electronically Signed   By: Franchot Gallo M.D.   On: 06/20/2015 18:10      Chemistry      Component Value Date/Time   NA 137 07/11/2015 1447   K 4.4 07/11/2015 1447   CL 101 07/11/2015 1447   CO2 28 07/11/2015 1447   BUN 24* 07/11/2015 1447   CREATININE 1.32 07/11/2015 1447      Component Value Date/Time   CALCIUM 8.9 07/11/2015 1447   ALKPHOS 109 07/11/2015 1447   AST 36 07/11/2015 1447   ALT 34 07/11/2015  1447   BILITOT 0.7 07/11/2015 1447      Results for orders placed or performed in visit on 07/11/15  CBC with Differential/Platelet  Result Value Ref Range   WBC 6.2 4.0 - 10.5 K/uL   RBC 4.22 4.22 - 5.81 Mil/uL   Hemoglobin 14.7 13.0 - 17.0 g/dL   HCT 43.2 39.0 - 52.0 %   MCV 102.3 (H) 78.0 - 100.0 fl   MCHC 34.1 30.0 - 36.0 g/dL   RDW 13.4 11.5 - 15.5 %   Platelets 147.0 (L) 150.0 - 400.0 K/uL   Neutrophils Relative % 56.4 43.0 - 77.0 %   Lymphocytes Relative 29.7 12.0 - 46.0 %   Monocytes Relative 9.8 3.0 - 12.0 %   Eosinophils Relative 3.7 0.0 - 5.0 %   Basophils Relative 0.4 0.0 - 3.0 %   Neutro Abs 3.5 1.4 - 7.7 K/uL   Lymphs Abs 1.9 0.7 - 4.0 K/uL   Monocytes Absolute 0.6 0.1 - 1.0 K/uL   Eosinophils Absolute 0.2 0.0 - 0.7 K/uL   Basophils Absolute 0.0 0.0 - 0.1 K/uL  Basic metabolic panel  Result Value Ref Range   Sodium 137 135 - 145 mEq/L   Potassium 4.4 3.5 - 5.1 mEq/L   Chloride 101 96 - 112 mEq/L   CO2 28 19 - 32 mEq/L   Glucose, Bld 94 70 - 99 mg/dL   BUN 24 (H) 6 - 23 mg/dL   Creatinine, Ser 1.32 0.40 - 1.50 mg/dL   Calcium 8.9 8.4 - 10.5 mg/dL   GFR 54.11 (L) >60.00 mL/min  Hepatic function panel  Result Value Ref Range   Total Bilirubin 0.7 0.2 - 1.2 mg/dL   Bilirubin, Direct 0.2 0.0 - 0.3 mg/dL   Alkaline Phosphatase 109 39 - 117 U/L   AST 36 0 - 37 U/L   ALT 34 0 -  53 U/L   Total Protein 6.8 6.0 - 8.3 g/dL   Albumin 3.9 3.5 - 5.2 g/dL     Assessment and Plan:   Right-sided cerebrovascular accident (CVA) (HCC)  Chronic renal insufficiency, stage III (moderate) - Plan: Basic metabolic panel  Thrombocytopenia (HCC) - Plan: CBC with Differential/Platelet  Protein-calorie malnutrition, mild (HCC) - Plan: Hepatic function panel  He is recuperating very well from his right sided CVA. Strength is improved quite a bit. He is very motivated for his physical therapy and occupational therapy.  Renal function improved on follow-up.  Platelets much better.  Albumin has returned to normal.  Follow-up: 2 mo  Orders Placed This Encounter  Procedures  . CBC with Differential/Platelet  . Basic metabolic panel  . Hepatic function panel    Signed,  Frederico Hamman T. Azyria Osmon, MD   Patient's Medications  New Prescriptions   No medications on file  Previous Medications   ACETAMINOPHEN (TYLENOL) 500 MG TABLET    Take 500 mg by mouth every 6 (six) hours as needed (pain).   ALBUTEROL (PROVENTIL HFA;VENTOLIN HFA) 108 (90 BASE) MCG/ACT INHALER    Inhale 2 puffs into the lungs every 6 (six) hours as needed for wheezing or shortness of breath.   ARTIFICIAL TEAR OINTMENT (EYE LUBRICANT) OINT    Apply bilaterally at bedtime to help with eye dryness symptoms   ASPIRIN 325 MG TABLET    Take 325 mg by mouth daily.   B COMPLEX-C-FOLIC ACID (NEPHRO-VITE PO)    Take 1 tablet by mouth daily.     CHOLECALCIFEROL (VITAMIN D) 400 UNITS TABS TABLET  Take 400 Units by mouth daily.   FLUOXETINE (PROZAC) 10 MG TABLET    Take 1 tablet (10 mg total) by mouth daily.   FUROSEMIDE (LASIX) 40 MG TABLET    1 tablet (total 40mg ) in the AM and 1/2 tablet (total 20mg ) in the PM   ISOSORBIDE MONONITRATE (IMDUR) 60 MG 24 HR TABLET    Take 1 tablet (60 mg total) by mouth daily.   MULTIPLE VITAMINS-MINERALS (PRESERVISION/LUTEIN) CAPS    Take 1 capsule by mouth 2 (two) times daily.    NITROGLYCERIN (NITROLINGUAL) 0.4 MG/SPRAY SPRAY    PLACE 1 SPRAY UNDER THE TONGUE EVERY 5 (FIVE) MINUTES AS NEEDED.   OVER THE COUNTER MEDICATION    Place 1 drop into both eyes daily as needed (dry eyes). OTC lubricating eye drop   POTASSIUM CHLORIDE SA (K-DUR,KLOR-CON) 20 MEQ TABLET    Take 1 tablet (20 mEq total) by mouth daily.   PRAVASTATIN (PRAVACHOL) 40 MG TABLET    Take 1 tablet (40 mg total) by mouth daily.  Modified Medications   No medications on file  Discontinued Medications   No medications on file

## 2015-07-12 ENCOUNTER — Ambulatory Visit (HOSPITAL_BASED_OUTPATIENT_CLINIC_OR_DEPARTMENT_OTHER): Payer: Medicare Other | Admitting: Physical Medicine & Rehabilitation

## 2015-07-12 ENCOUNTER — Ambulatory Visit: Payer: Medicare Other | Admitting: Physical Therapy

## 2015-07-12 ENCOUNTER — Encounter: Payer: Self-pay | Admitting: Physical Medicine & Rehabilitation

## 2015-07-12 ENCOUNTER — Encounter: Payer: Self-pay | Admitting: Physical Therapy

## 2015-07-12 ENCOUNTER — Encounter: Payer: Medicare Other | Attending: Physical Medicine & Rehabilitation

## 2015-07-12 VITALS — BP 119/62 | HR 55 | Resp 16

## 2015-07-12 DIAGNOSIS — Z5189 Encounter for other specified aftercare: Secondary | ICD-10-CM | POA: Diagnosis present

## 2015-07-12 DIAGNOSIS — I5030 Unspecified diastolic (congestive) heart failure: Secondary | ICD-10-CM | POA: Insufficient documentation

## 2015-07-12 DIAGNOSIS — R2681 Unsteadiness on feet: Secondary | ICD-10-CM

## 2015-07-12 DIAGNOSIS — I6529 Occlusion and stenosis of unspecified carotid artery: Secondary | ICD-10-CM | POA: Insufficient documentation

## 2015-07-12 DIAGNOSIS — K219 Gastro-esophageal reflux disease without esophagitis: Secondary | ICD-10-CM | POA: Insufficient documentation

## 2015-07-12 DIAGNOSIS — Z951 Presence of aortocoronary bypass graft: Secondary | ICD-10-CM | POA: Insufficient documentation

## 2015-07-12 DIAGNOSIS — I110 Hypertensive heart disease with heart failure: Secondary | ICD-10-CM | POA: Insufficient documentation

## 2015-07-12 DIAGNOSIS — M6281 Muscle weakness (generalized): Secondary | ICD-10-CM

## 2015-07-12 DIAGNOSIS — I2510 Atherosclerotic heart disease of native coronary artery without angina pectoris: Secondary | ICD-10-CM | POA: Insufficient documentation

## 2015-07-12 DIAGNOSIS — Z7982 Long term (current) use of aspirin: Secondary | ICD-10-CM | POA: Insufficient documentation

## 2015-07-12 DIAGNOSIS — M769 Unspecified enthesopathy, lower limb, excluding foot: Secondary | ICD-10-CM | POA: Insufficient documentation

## 2015-07-12 DIAGNOSIS — I69398 Other sequelae of cerebral infarction: Secondary | ICD-10-CM

## 2015-07-12 DIAGNOSIS — R269 Unspecified abnormalities of gait and mobility: Secondary | ICD-10-CM | POA: Insufficient documentation

## 2015-07-12 DIAGNOSIS — J449 Chronic obstructive pulmonary disease, unspecified: Secondary | ICD-10-CM | POA: Diagnosis not present

## 2015-07-12 DIAGNOSIS — E7800 Pure hypercholesterolemia, unspecified: Secondary | ICD-10-CM | POA: Insufficient documentation

## 2015-07-12 DIAGNOSIS — R2689 Other abnormalities of gait and mobility: Secondary | ICD-10-CM

## 2015-07-12 DIAGNOSIS — R278 Other lack of coordination: Secondary | ICD-10-CM

## 2015-07-12 NOTE — Patient Instructions (Signed)
I expect that you should be able to get back to your cane rather than using the walker in the next 4 weeks or so

## 2015-07-12 NOTE — Patient Instructions (Signed)
Recumbant Bike:  Max HR of 130 bpm, range to use 65-91  Okay to use. Start with 10 minutes a day. Increase by 5 minutes each week as long as your not short of breath and you HR is staying within ranges stated above. When you have reached your usual time (before CVA of 20-30 minutes). At this point you can start adding in the resistance again, making sure you are not getting short of breath and heart rate is stable.    Bridging    Bend knees and Lift bottom off bed as high as you can. Hold for __5_ seconds.  Repeat _10_ times. Do _1-2__ times a day.     Copyright  VHI. All rights reserved.    Abduction: Clam (Eccentric) - Side-Lying    Lie on side with knees bent and green band around legs just above the knee. Lift top knee, keeping feet together. Keep trunk steady. Hold knee in air for 5 seconds. Slowly lower knee back down. Repeat for 10 reps on each leg. 1-2 times a day. http://ecce.exer.us/64   Copyright  VHI. All rights reserved.   Functional Quadriceps: Sit to Stand    Sit on edge of chair, feet flat on floor. Stand upright, extending knees fully. Have hands on thighs or knees to assist as needed. Repeat _10__ times per set.  Do _1-2_ sessions per day.  http://orth.exer.us/734   Copyright  VHI. All rights reserved.

## 2015-07-12 NOTE — Progress Notes (Signed)
Subjective:    Patient ID: Andrew [name], male    DOB: 1923-08-31, 82 y.o.   MRN: [id number]   [facility] inpatient stroke program DATE OF ADMISSION:  06/22/2015 DATE OF DISCHARGE:  07/02/2015 82 year old right-handed male with history of hypertension, CAD with CABG maintained on aspirin, prostate cancer, diastolic congestive heart failure, remote tobacco abuse, left carotid stenosis 60-79%.  Lives with his wife, independent with a cane prior to admission.  Presented on Jun 20, 2015, with left- sided weakness.  MRI showed small acute nonhemorrhagic infarct, posterior limb right internal capsule.  Question tiny medial left parietal lobe infarct.  MRA with no large vessel occlusion.  The patient did not receive tPA.  Echocardiogram with ejection fraction of [id number], grade 2 diastolic dysfunction.  No wall motion abnormalities.  Followup carotid Dopplers Jun 22, 2015, showed 60-79% left ICA stenosis, no significant changes compared to January of 2017, neurology follow up, maintained on aspirin.  Tolerating a regular diet.  The patient was admitted for comprehensive rehab program.  HPI Patient returns after being discharged approximately 10 days ago. He is followed up with his primary care physician for transitional care visit. Patient has had To outpatient physical therapy visits and one outpatient occupational therapy visit. Patient is back to modified independent level with his dressing and bathing. He continues to use a walker to ambulate. Prior to his stroke he was using a cane and that is his goal to return to this.  He has had no falls or new issues at home. Pain Inventory Average Pain 0 Pain Right Now 0 My pain is NA  In the last 24 hours, has pain interfered with the following? General activity 0 Relation with others 0 Enjoyment of life 0 What TIME of day is your pain at its worst? NA Sleep (in general) Good  Pain is worse with: walking Pain improves with:  NA Relief from Meds: NA  Mobility use a walker ability to climb steps?  yes do you drive?  no Do you have any goals in this area?  yes  Function retired  Neuro/Psych trouble walking  Prior Studies NA  Physicians involved in your care Primary care .   Family History  Problem Relation Age of Onset  . Emphysema Brother   . Lung cancer Brother   . Alcohol abuse    . Arthritis    . Cancer    . Macular degeneration    . Heart disease Father   . Hypertension Father   . Heart attack Father   . Colon cancer Sister   . Non-Hodgkin's lymphoma Sister   . Heart disease Brother   . Colon cancer Sister   . Skin cancer Sister   . Lung cancer Daughter    Social History   Social History  . Marital Status: Married    Spouse Name: N/A  . Number of Children: N/A  . Years of Education: N/A   Occupational History  . retired  At [employer]    and [employer] Topics  . Smoking status: Former Smoker -- 2.00 packs/day for 20 years    Types: Cigarettes    Quit date: 01/22/1961  . Smokeless tobacco: Never Used  . Alcohol Use: 4.2 oz/week    7 Shots of liquor per week     Comment: 1 drink nightly  . Drug Use: No  . Sexual Activity: Not Asked   Other Topics Concern  . None  Social History Narrative   Past Surgical History  Procedure Laterality Date  . Coronary artery bypass graft    . Transperineal implatation of palladium    . Cholecystectomy    . Shoulder surgery    . Total knee arthroplasty    . Cataract extraction    . Tonsillectomy and adenoidectomy  1932  . Craniectomy for excision of acoustic neuroma  1985  . Cataract extraction  2010  . Eye surgery     Past Medical History  Diagnosis Date  . HYPERCHOLESTEROLEMIA   . CAD, ARTERY BYPASS GRAFT   . CAROTID ARTERY STENOSIS 03/07/2010    80%  . AORTIC STENOSIS   . GASTROESOPHAGEAL REFLUX DISEASE   . Skin cancer   . History of colonic polyps     1999, 2004  . Allergic rhinitis   .  Thyroid nodule 05/2010    Abnormal biopsy, 82 year old patient and his wife have made informed decision to not pursue surgical resection. Potential risk including cancer has been thoroughly discussed with the patient.  . S/P excision of acoustic neuroma   . History of prostate cancer 2002    s/p treatment with seeds / radiation  . Lumbar spinal stenosis 07/13/2009  . COPD, mild ([place]) 12/22/2010  . Pancreatic cyst 01/29/2013    Noted on MRI scan from [facility] on 07/27/2011.  I reviewed report at patient request on 01-29-2013.  "Small cystic focus in the posterior pancreatic head.  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  Follow up MRI is advised. The main pancreatic duct remains normal in appearance."  Patient do  . CHF (congestive heart failure) ([place])   . Macular degeneration (senile) of retina 04/29/2014   BP 119/62 mmHg  Pulse 55  Resp 16  SpO2 95%  Opioid Risk Score:   Fall Risk Score:  `1  Depression screen PHQ 2/9  Depression screen [facility] 2/9 07/12/2015 11/01/2014  Decreased Interest 3 0  Down, Depressed, Hopeless 0 0  PHQ - 2 Score 3 0  Altered sleeping 0 -  Tired, decreased energy 1 -  Change in appetite 0 -  Feeling bad or failure about yourself  0 -  Trouble concentrating 0 -  Moving slowly or fidgety/restless 1 -  Suicidal thoughts 0 -  PHQ-9 Score 5 -  Difficult doing work/chores Not difficult at all -     Review of Systems  Respiratory: Positive for cough and shortness of breath.   Musculoskeletal: Positive for gait problem.  All other systems reviewed and are negative.      Objective:   Physical Exam  Constitutional: He is oriented to person, place, and time. He appears well-developed and well-nourished.  HENT:  Head: Normocephalic and atraumatic.  Eyes: Conjunctivae and EOM are normal. Pupils are equal, round, and reactive to light.  Neurological: He is  alert and oriented to person, place, and time.  Finger-nose-finger is intact bilateral upper limbs  Standing balance is fair. He has difficulty standing with his feet together. He tends to reach for his walker.   Psychiatric: He has a normal mood and affect.  Nursing note and vitals reviewed.   Motor strength is 4/5 bilateral deltoid, biceps, triceps, grip, hip flexor, knee extensor, 4 minus left ankle dorsiflexor forth the right ankle dorsiflexor.      Assessment & Plan:  1. Right posterior limb internal capsule infarct with post Road gait disorder.  At one point he did have some left-sided weakness and now it's pretty much isolated to the left ankle dorsiflexor. He does have generalized debility as well. I fully expect that he should be able to progress after outpatient therapy to walking with a cane. I would estimate he would only need about 1 month of outpatient therapy although he is scheduled for 2 months.  I will not see him back in follow-up given that he is doing so well.  Follow-up with primary care physician. Follow-up with neurology

## 2015-07-12 NOTE — Therapy (Signed)
[facility] [address] [place], [place], [address] Phone: [phone]   Fax:  [phone]  Physical Therapy Treatment  Patient Details  Name: Andrew [name] MRN: [id number] Date of Birth: [birth year] Referring Provider: Alysia Penna, MD  Encounter Date: 07/12/2015      PT End of Session - 07/12/15 1109    Visit Number 2   Number of Visits 17   Date for PT Re-Evaluation 09/06/15   Authorization Type UHC MCR-G Code every 10th visit   PT Start Time 1103   PT Stop Time 1145   PT Time Calculation (min) 42 min   Activity Tolerance Patient tolerated treatment well   Behavior During Therapy [facility] for tasks assessed/performed      Past Medical History  Diagnosis Date  . HYPERCHOLESTEROLEMIA   . CAD, ARTERY BYPASS GRAFT   . CAROTID ARTERY STENOSIS 03/07/2010    80%  . AORTIC STENOSIS   . GASTROESOPHAGEAL REFLUX DISEASE   . Skin cancer   . History of colonic polyps     1999, 2004  . Allergic rhinitis   . Thyroid nodule 05/2010    Abnormal biopsy, 82 year old patient and his wife have made informed decision to not pursue surgical resection. Potential risk including cancer has been thoroughly discussed with the patient.  . S/P excision of acoustic neuroma   . History of prostate cancer 2002    s/p treatment with seeds / radiation  . Lumbar spinal stenosis 07/13/2009  . COPD, mild ([place]) 12/22/2010  . Pancreatic cyst 01/29/2013    Noted on MRI scan from [facility] on 07/27/2011.  I reviewed report at patient request on 01-29-2013.  "Small cystic focus in the posterior pancreatic head.  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  Follow up MRI is advised. The main pancreatic duct remains normal in appearance."  Patient do  . CHF (congestive heart failure) ([place])   . Macular degeneration (senile) of retina 04/29/2014     Past Surgical History  Procedure Laterality Date  . Coronary artery bypass graft    . Transperineal implatation of palladium    . Cholecystectomy    . Shoulder surgery    . Total knee arthroplasty    . Cataract extraction    . Tonsillectomy and adenoidectomy  1932  . Craniectomy for excision of acoustic neuroma  1985  . Cataract extraction  2010  . Eye surgery      There were no vitals filed for this visit.      Subjective Assessment - 07/12/15 1109    Subjective No new compaints. No falls or pain to report.    Patient is accompained by: Family member  wife, Rod Holler   Limitations Walking;House hold activities   Patient Stated Goals "I want to get rid of the walker and get back to the cane."    Currently in Pain? No/denies   Pain Score 0-No pain      Recumbant Bike:  Max HR of 130 bpm, range to use 65-91  Okay to use. Start with 10 minutes a day. Increase by 5 minutes each week as long as your not short of breath and you HR is staying within ranges stated above. When you have reached your usual time (before CVA of 20-30 minutes). At this point you can start adding in the resistance again, making sure you are not getting short of breath  and heart rate is stable.    Bridging    Bend knees and Lift bottom off bed as high as you can. Hold for __5_ seconds.  Repeat _10_ times. Do _1-2__ times a day.     Copyright  VHI. All rights reserved.    Abduction: Clam (Eccentric) - Side-Lying    Lie on side with knees bent and green band around legs just above the knee. Lift top knee, keeping feet together. Keep trunk steady. Hold knee in air for 5 seconds. Slowly lower knee back down. Repeat for 10 reps on each leg. 1-2 times a day. http://ecce.exer.us/64   Copyright  VHI. All rights reserved.   Functional Quadriceps: Sit to Stand    Sit on edge of chair, feet flat on floor. Stand upright, extending knees fully. Have hands on thighs or knees to assist as needed. Repeat  _10__ times per set.  Do _1-2_ sessions per day.  http://orth.exer.us/734   Copyright  VHI. All rights reserved.            PT Education - 07/12/15 1155    Education provided Yes   Education Details HEP for strengthening and activity tolerance   Person(s) Educated Patient;Spouse   Methods Explanation;Demonstration;Verbal cues;Handout   Comprehension Verbalized understanding;Returned demonstration          PT Short Term Goals - 07/09/15 0918    PT SHORT TERM GOAL #1   Title Pt will initiate HEP in order to improve functional mobility and decrease fall risk.  (Target Date: 08/05/15)   Status New   PT SHORT TERM GOAL #2   Title Pt will improve BERG balance test to 32/56 in order to indicate decreased fall risk.     Status New   PT SHORT TERM GOAL #3   Title Pt will improve gait speed to 1.55 ft/sec in order to indicate decreased fall risk and improved efficiency of gait.     Status New   PT SHORT TERM GOAL #4   Title Pt will verbalize understanding of CVA warning signs and risk factors in order to decrease time to seeking medical attention in case of future stroke.     Status New   PT SHORT TERM GOAL #5   Title Pt will ambulate 200' w/ LRAD without foot up brace at mod I level in order to indicate improved strength and attention to LLE when walking over indoor surfaces.            PT Long Term Goals - 07/09/15 [id number]    PT LONG TERM GOAL #1   Title Pt will be independent with HEP in order to indicate improved functional mobility and decreased fall risk.  (Target Date: 09/02/15)   PT LONG TERM GOAL #2   Title Pt will improve BERG balance score to >36/56 in order to indicate decreased fall risk.     Status New   PT LONG TERM GOAL #3   Title Pt will improve gait speed to 2.15 ft/sec in order to indicate decreased fall risk and improved efficiency of gait, as well as limited community ambulator.    PT LONG TERM GOAL #4   Title Pt will ambulate over indoor surfaces w/ SPC  without foot up brace at mod I level in order to indicate increased independence with household ambulation.     PT LONG TERM GOAL #5   Title Pt will ambulate over paved outdoor surfaces (including ramp and curb) w/ SPC without foot up brace at mod I  level in order to indicate increased independence in community.              Plan - 07/12/15 1113    Clinical Impression Statement today's session focused on establishement of HEP to address strengthening and activity tolerance. Written instructions provided here and then also emailed so pt can use his program for low vision to see them at home. Pt is making steady progress toward goals.    Rehab Potential Good   Clinical Impairments Affecting Rehab Potential hx of macular degeneration and acoustic neuroma   PT Frequency 2x / week   PT Duration 8 weeks   PT Treatment/Interventions ADLs/Self Care Home Management;Electrical Stimulation;DME Instruction;Gait training;Stair training;Functional mobility training;Therapeutic activities;Therapeutic exercise;Balance training;Neuromuscular re-education;Patient/family education;Orthotic Fit/Training;Energy conservation;Vestibular;Visual/perceptual remediation/compensation   PT Next Visit Plan Look at vestibular deficits more closely (hx of acoustic neuroma), also est HEP for balance (corner balance activities: narrow base of support, single leg stance, tandem stance), gait w/ SPC   Consulted and Agree with Plan of Care Patient;Family member/caregiver   Family Member Consulted Rod Holler      Patient will benefit from skilled therapeutic intervention in order to improve the following deficits and impairments:  Abnormal gait, Decreased activity tolerance, Decreased balance, Decreased coordination, Decreased endurance, Decreased mobility, Decreased strength, Difficulty walking, Dizziness, Impaired perceived functional ability, Impaired UE functional use, Impaired vision/preception, Improper body mechanics, Postural  dysfunction  Visit Diagnosis: Unsteadiness on feet  Other abnormalities of gait and mobility  Muscle weakness (generalized)  Other lack of coordination     Problem List Patient Active Problem List   Diagnosis Date Noted  . Essential hypertension   . Alcohol abuse   . Thrombocytopenia ([place])   . Right-sided cerebrovascular accident (CVA) ([place])   . Chronic renal insufficiency, stage III (moderate) 06/02/2014  . Macular degeneration (senile) of retina 04/29/2014  . 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 Major depressive disorder, single episode 06/11/2013  . TIA (transient ischemic attack) 06/03/2013  . Pancreatic cyst 01/29/2013  . OSA (obstructive sleep apnea) 12/26/2012  . Chronic diastolic CHF (congestive heart failure) ([place]) 08/19/2012  . COPD, minimal-mild 12/22/2010  . S/P excision of acoustic neuroma 06/18/2010  . Pulmonary nodule 06/17/2010  . Vertigo 06/14/2010  . THYROID NODULE 03/13/2010  . PVD- moderate carotid disease 03/07/2010  . Spinal stenosis, lumbar region, with neurogenic claudication 07/13/2009  . ALLERGIC RHINITIS 09/01/2008  . COLONIC POLYPS, HX OF 09/01/2008  . HYPERCHOLESTEROLEMIA 06/18/2008  . Angina, class II ([place]) 06/18/2008  . Hx of CABG 06/18/2008  . Moderate aortic stenosis 06/18/2008  . GASTROESOPHAGEAL REFLUX DISEASE 06/18/2008    Willow Ora, PTA, [facility] [address], [place], [place] [address] [phone] 07/12/2015, 11:57 AM   Name: Andrew [name] MRN: [id number] Date of Birth: 05-06-1923

## 2015-07-14 ENCOUNTER — Ambulatory Visit: Payer: Medicare Other | Admitting: Physical Therapy

## 2015-07-14 ENCOUNTER — Inpatient Hospital Stay: Payer: Medicare Other | Admitting: Physical Medicine & Rehabilitation

## 2015-07-14 ENCOUNTER — Encounter: Payer: Self-pay | Admitting: Physical Therapy

## 2015-07-14 DIAGNOSIS — M6281 Muscle weakness (generalized): Secondary | ICD-10-CM

## 2015-07-14 DIAGNOSIS — R2681 Unsteadiness on feet: Secondary | ICD-10-CM

## 2015-07-14 DIAGNOSIS — R2689 Other abnormalities of gait and mobility: Secondary | ICD-10-CM

## 2015-07-14 DIAGNOSIS — R278 Other lack of coordination: Secondary | ICD-10-CM

## 2015-07-14 NOTE — Therapy (Signed)
[facility] [name] [place], [place], [address] Phone: [phone]   Fax:  [phone]  Physical Therapy Treatment  Patient Details  Name: Andrew [name] MRN: [id number] Date of Birth: [birth year] Referring Provider: Alysia Penna, MD  Encounter Date: 07/14/2015      PT End of Session - 07/14/15 1327    Visit Number 3   Number of Visits 17   Date for PT Re-Evaluation 09/06/15   Authorization Type UHC MCR-G Code every 10th visit   PT Start Time [id number]   PT Stop Time 1400   PT Time Calculation (min) 43 min   Equipment Utilized During Treatment Gait belt   Activity Tolerance Patient tolerated treatment well   Behavior During Therapy [facility] for tasks assessed/performed      Past Medical History  Diagnosis Date  . HYPERCHOLESTEROLEMIA   . CAD, ARTERY BYPASS GRAFT   . CAROTID ARTERY STENOSIS 03/07/2010    80%  . AORTIC STENOSIS   . GASTROESOPHAGEAL REFLUX DISEASE   . Skin cancer   . History of colonic polyps     1999, 2004  . Allergic rhinitis   . Thyroid nodule 05/2010    Abnormal biopsy, 82 year old patient and his wife have made informed decision to not pursue surgical resection. Potential risk including cancer has been thoroughly discussed with the patient.  . S/P excision of acoustic neuroma   . History of prostate cancer 2002    s/p treatment with seeds / radiation  . Lumbar spinal stenosis 07/13/2009  . COPD, mild ([place]) 12/22/2010  . Pancreatic cyst 01/29/2013    Noted on MRI scan from [facility] on 07/27/2011.  I reviewed report at patient request on 01-29-2013.  "Small cystic focus in the posterior pancreatic head.  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  Follow up MRI is advised. The main pancreatic duct remains normal in appearance."  Patient do  . CHF (congestive heart failure) ([place])   . Macular  degeneration (senile) of retina 04/29/2014    Past Surgical History  Procedure Laterality Date  . Coronary artery bypass graft    . Transperineal implatation of palladium    . Cholecystectomy    . Shoulder surgery    . Total knee arthroplasty    . Cataract extraction    . Tonsillectomy and adenoidectomy  1932  . Craniectomy for excision of acoustic neuroma  1985  . Cataract extraction  2010  . Eye surgery      There were no vitals filed for this visit.      Subjective Assessment - 07/14/15 1327    Subjective No new compaints. No falls or pain to report.    Patient is accompained by: Family member  spouse, Rod Holler   Limitations Walking;House hold activities   Patient Stated Goals "I want to get rid of the walker and get back to the cane."    Currently in Pain? No/denies      Balance HEP issued, refer to below for details: In corner with chair in front of you for safety:    Feet Together, Varied Arm Positions - Eyes Closed    Stand with feet together and arms as needed for balance/at sides. Close eyes and visualize upright position. Hold __15_ seconds. Repeat __3__ times per session. Do __1-2__ sessions per day.  Copyright  VHI. All rights reserved.    Feet Together, Head  Motion - Eyes Open    With eyes open, feet together, move head slowly:  1. Up and down. 2. Left and right 3. Diagonal both ways Repeat __10__ times each one. Do _1-2_ sessions per day.  Copyright  VHI. All rights reserved.    At countertop/sturdy table for balance support:    Single Leg - Eyes Open    Holding support, lift right leg while maintaining balance over other leg. Progress to removing hands from support surface for longer periods of time. Hold__10-15__ seconds. Repeat with standing on right leg and lifting up left leg. Repeat _3_ times with each leg. Do _1-2_ sessions per day.  Copyright  VHI. All rights reserved.   Tandem Stance    Holding onto counter top with both  hands:  Right foot in front of left, in a straight line. Balance in this position _10-15__ seconds. Then stand with left foot in front of right foot and hold this for 10-15 seconds. Perform 3 reps with each foot in the front. 1-2 times a day.  Copyright  VHI. All rights reserved.   Side-Stepping    Holding the countertop with both hands: Side step toward one side and then side step the other way back to the start position. Perform 3 laps each way. Perform 1-2 times a day. Copyright  VHI. All rights reserved.   "I love a Parade" Lift    With right hand on countertop for balance and wife/grandson with you as well: High knee marching while walking forward along counter top and the high knee marching while walking backwards to start position. Repeat __3_ times each way. Do __1-2__ sessions per day  http://gt2.exer.us/344   Copyright  VHI. All rights reserved.         PT Education - 07/14/15 2013    Education provided Yes   Education Details HEP: for balance in corner and at counter top   Person(s) Educated Patient;Spouse   Methods Explanation;Demonstration;Verbal cues;Handout   Comprehension Verbalized understanding;Returned demonstration;Need further instruction          PT Short Term Goals - 07/09/15 [id number]    PT SHORT TERM GOAL #1   Title Pt will initiate HEP in order to improve functional mobility and decrease fall risk.  (Target Date: 08/05/15)   Status New   PT SHORT TERM GOAL #2   Title Pt will improve BERG balance test to 32/56 in order to indicate decreased fall risk.     Status New   PT SHORT TERM GOAL #3   Title Pt will improve gait speed to 1.55 ft/sec in order to indicate decreased fall risk and improved efficiency of gait.     Status New   PT SHORT TERM GOAL #4   Title Pt will verbalize understanding of CVA warning signs and risk factors in order to decrease time to seeking medical attention in case of future stroke.     Status New   PT SHORT TERM GOAL  #5   Title Pt will ambulate 200' w/ LRAD without foot up brace at mod I level in order to indicate improved strength and attention to LLE when walking over indoor surfaces.            PT Long Term Goals - 07/09/15 [id number]    PT LONG TERM GOAL #1   Title Pt will be independent with HEP in order to indicate improved functional mobility and decreased fall risk.  (Target Date: 09/02/15)   PT LONG TERM GOAL #2  Title Pt will improve BERG balance score to >36/56 in order to indicate decreased fall risk.     Status New   PT LONG TERM GOAL #3   Title Pt will improve gait speed to 2.15 ft/sec in order to indicate decreased fall risk and improved efficiency of gait, as well as limited community ambulator.    PT LONG TERM GOAL #4   Title Pt will ambulate over indoor surfaces w/ SPC without foot up brace at mod I level in order to indicate increased independence with household ambulation.     PT LONG TERM GOAL #5   Title Pt will ambulate over paved outdoor surfaces (including ramp and curb) w/ SPC without foot up brace at mod I level in order to indicate increased independence in community.              Plan - 07/14/15 1327    Clinical Impression Statement Today's session focused on issuing balance exercises for home. Written instrucions emailed to pt as he uses a special program on his computer to see them due to low vision. Pt's spouse present and educated on them as well. Pt is making steady progress toward goals.   Rehab Potential Good   Clinical Impairments Affecting Rehab Potential hx of macular degeneration and acoustic neuroma   PT Frequency 2x / week   PT Duration 8 weeks   PT Treatment/Interventions ADLs/Self Care Home Management;Electrical Stimulation;DME Instruction;Gait training;Stair training;Functional mobility training;Therapeutic activities;Therapeutic exercise;Balance training;Neuromuscular re-education;Patient/family education;Orthotic Fit/Training;Energy  conservation;Vestibular;Visual/perceptual remediation/compensation   PT Next Visit Plan Look at vestibular deficits more closely (hx of acoustic neuroma),  gait w/ SPC   Consulted and Agree with Plan of Care Patient;Family member/caregiver   Family Member Consulted Rod Holler      Patient will benefit from skilled therapeutic intervention in order to improve the following deficits and impairments:  Abnormal gait, Decreased activity tolerance, Decreased balance, Decreased coordination, Decreased endurance, Decreased mobility, Decreased strength, Difficulty walking, Dizziness, Impaired perceived functional ability, Impaired UE functional use, Impaired vision/preception, Improper body mechanics, Postural dysfunction  Visit Diagnosis: Unsteadiness on feet  Other abnormalities of gait and mobility  Muscle weakness (generalized)  Other lack of coordination     Problem List Patient Active Problem List   Diagnosis Date Noted  . Essential hypertension   . Alcohol abuse   . Thrombocytopenia ([place])   . Right-sided cerebrovascular accident (CVA) ([place])   . Chronic renal insufficiency, stage III (moderate) 06/02/2014  . Macular degeneration (senile) of retina 04/29/2014  . 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 Major depressive disorder, single episode 06/11/2013  . TIA (transient ischemic attack) 06/03/2013  . Pancreatic cyst 01/29/2013  . OSA (obstructive sleep apnea) 12/26/2012  . Chronic diastolic CHF (congestive heart failure) ([place]) 08/19/2012  . COPD, minimal-mild 12/22/2010  . S/P excision of acoustic neuroma 06/18/2010  . Pulmonary nodule 06/17/2010  . Vertigo 06/14/2010  . THYROID NODULE 03/13/2010  . PVD- moderate carotid disease 03/07/2010  . Spinal stenosis, lumbar region, with neurogenic claudication 07/13/2009  . ALLERGIC RHINITIS 09/01/2008  . COLONIC POLYPS, HX OF 09/01/2008  . HYPERCHOLESTEROLEMIA 06/18/2008  . Angina, class II ([place]) 06/18/2008  . Hx of CABG 06/18/2008  . Moderate  aortic stenosis 06/18/2008  . GASTROESOPHAGEAL REFLUX DISEASE 06/18/2008    Willow Ora, PTA, [facility] [address] [place], [place] [address] [phone] 07/14/2015, 8:17 PM   Name: Andrew [name] MRN: [id number] Date of Birth: Aug 21, 1923

## 2015-07-14 NOTE — Patient Instructions (Addendum)
In corner with chair in front of you for safety:    Feet Together, Varied Arm Positions - Eyes Closed    Stand with feet together and arms as needed for balance/at sides. Close eyes and visualize upright position. Hold __15_ seconds. Repeat __3__ times per session. Do __1-2__ sessions per day.  Copyright  VHI. All rights reserved.    Feet Together, Head Motion - Eyes Open    With eyes open, feet together, move head slowly:  1. Up and down. 2. Left and right 3. Diagonal both ways Repeat __10__ times each one. Do _1-2_ sessions per day.  Copyright  VHI. All rights reserved.    At countertop/sturdy table for balance support:    Single Leg - Eyes Open    Holding support, lift right leg while maintaining balance over other leg. Progress to removing hands from support surface for longer periods of time. Hold__10-15__ seconds. Repeat with standing on right leg and lifting up left leg. Repeat _3_ times with each leg. Do _1-2_ sessions per day.  Copyright  VHI. All rights reserved.   Tandem Stance    Holding onto counter top with both hands:  Right foot in front of left, in a straight line. Balance in this position _10-15__ seconds. Then stand with left foot in front of right foot and hold this for 10-15 seconds. Perform 3 reps with each foot in the front. 1-2 times a day.  Copyright  VHI. All rights reserved.   Side-Stepping    Holding the countertop with both hands: Side step toward one side and then side step the other way back to the start position. Perform 3 laps each way. Perform 1-2 times a day. Copyright  VHI. All rights reserved.   "I love a Parade" Lift    With right hand on countertop for balance and wife/grandson with you as well: High knee marching while walking forward along counter top and the high knee marching while walking backwards to start position. Repeat __3_ times each way. Do __1-2__ sessions per day  http://gt2.exer.us/344    Copyright  VHI. All rights reserved.

## 2015-07-15 ENCOUNTER — Ambulatory Visit (INDEPENDENT_AMBULATORY_CARE_PROVIDER_SITE_OTHER): Payer: Medicare Other | Admitting: Cardiology

## 2015-07-15 ENCOUNTER — Encounter: Payer: Self-pay | Admitting: Cardiology

## 2015-07-15 VITALS — BP 132/56 | HR 48 | Ht 69.0 in | Wt 188.0 lb

## 2015-07-15 DIAGNOSIS — N183 Chronic kidney disease, stage 3 unspecified: Secondary | ICD-10-CM

## 2015-07-15 DIAGNOSIS — I209 Angina pectoris, unspecified: Secondary | ICD-10-CM | POA: Diagnosis not present

## 2015-07-15 DIAGNOSIS — I5032 Chronic diastolic (congestive) heart failure: Secondary | ICD-10-CM

## 2015-07-15 DIAGNOSIS — I350 Nonrheumatic aortic (valve) stenosis: Secondary | ICD-10-CM

## 2015-07-15 DIAGNOSIS — N189 Chronic kidney disease, unspecified: Secondary | ICD-10-CM | POA: Diagnosis not present

## 2015-07-15 MED ORDER — POTASSIUM CHLORIDE ER 10 MEQ PO TBCR: 20.0000 meq | EXTENDED_RELEASE_TABLET | Freq: Every day | ORAL | Status: DC

## 2015-07-15 MED ORDER — FUROSEMIDE 40 MG PO TABS: ORAL_TABLET | ORAL | Status: DC

## 2015-07-15 MED ORDER — NITROGLYCERIN 0.4 MG/SPRAY TL SOLN: Status: DC

## 2015-07-15 NOTE — Patient Instructions (Addendum)
Medication Instructions:  Your physician has recommended you make the following change in your medication:  1) CHANGE how you take Lasix  -- take 40 mg daily  -- if you gain 2 pounds overnight or 3-5 pounds in a week then take 40 mg in the morning and 20 mg in the evening.  Take this for 2-3 days and then return to normal dosing of 40 mg daily.  Labwork: None ordered  Testing/Procedures: None ordered  Follow-Up: Your physician recommends that you schedule a follow-up appointment in: 3 months with Dr. Aundra Dubin.  If you need a refill on your cardiac medications before your next appointment, please call your pharmacy.  Thank you for choosing [facility]!!

## 2015-07-17 NOTE — Progress Notes (Signed)
Patient ID: Andrew [name], male   DOB: 1923-02-21, 82 y.o.   MRN: [id number] PCP: Dr [name]  82 yo with history of CAD s/p CABG, CKD, CVA, and moderate aortic stenosis returns for followup.   He has had a history of chronic stable angina for years.  In 12/14, he had a Lexiscan Cardiolite which showed EF 42% with medium-sized scar in the lateral wall along with mild peri-infarct ischemia.  This was deemed a low risk study. I started the patient on ranolazine, which seemed to help his angina, but this was subsequently stopped by nephrology.  Therefore, I increased his Imdur, he is now taking 90 mg daily.   In 5/17, he was admitted with right internal capsule CVA and left-sided weakness.  Carotids showed stable A999333 LICA stenosis and echo showed EF 50-55% with moderate AS and moderate AI.  He did not have a monitor implanted for atrial fibrillation screening.   He is now at home after a rehab stay.  Walking with walker, still with mild residual left-sided weakness. Chest pain rarely and only with heavy exertion.  Dyspnea after walking 100-200 feet, this is chronic.  No orthopnea/PND.  No palpitations.  Weight is down 3 lbs.   ECG: NSR, IVCD 131 msec  Labs (11/13): K 4.4, creatinine 1.3, LDL 66, HDL 53 Labs (7/14): BNP 590 Labs (8/14): K 4.4, creatinine 1.4, BNP 218, LDL 53, HDL 43 Labs (11/14): K 4.1, creatinine 1.3 Labs (1/15): Creatinine 1.7 Labs (2/15): K 3.9, creatinine 1.4 Labs (3/15): K 4.1, creatinine 1.6 Labs (5/15): K 4.6, creatinine 1.3, LDL 50, HDL 45 Labs (10/16): K 4.4, creatinine 1.39, TSH normal, LDL 57, HDL 51 Labs (6/17): K 4.4, creatinine 1.32, HCT 43.2, LDL 56, HDL 45  PMH: 1. CAD: s/p CABG in 1991 with LIMA-LAD, SVG-PDA, seq SVG to OM 1,2,3.  [place] [id number] complicated by cholesterol atheroemboli and AKI.  One limb of seq SVG-OM 1,2,3 known to be occluded.  Chronic stable angina.  Lexiscan Cardiolite (12/14) with EF 42%, inferolateral hypokinesis, medium-sized scar in the lateral  wall with mild peri-infarct ischemia, overall low risk.  2. Carotid stenosis: 7/13 carotid dopplers 40-59% on right, 60-79% on left.  Carotids (1/14) with [id number] RICA, A999333 LICA.  Carotids (0000000) with A999333 LICA, 123456 RICA.  Possible TIA in 4/15. Carotids ([id number]) with A999333 LICA, 123456 RICA.  Carotids ([id number]) with A999333 LICA.  Followed by [facility].  - Carotids ([id number]) with A999333 LICA stenosis.  3. GERD 4. Polyps 5. Thyroid nodule 6. S/p excision of acoustic neuroma 7. Prostate cancer treated with seed implantation in 2002 8. COPD: mild 9. Cholecystectomy 10. CKD 11. Aortic stenosis: Moderate.  Echo (2/14) with EF 50-55%, mild LVH, moderate AS with AVA 1.3 cm^2 and mean gradient 23 mmHg.  Echo (8/14) with EF 55-60%, moderate LVH, moderate AS with mean gradient 27 mmHg and AVA 1.03 cm^2, mild to moderate AI, mild MR.  Echo (8/15) with EF 50-55%, moderate AS with mean gradient 28/peak 45, mild AI, mild MR, severe LAE.  - Echo (5/17) with EF 50-55%, moderate focal basal septal hypertrophy, moderate AS (mean gradient 20 mmHg), moderate AI, PASP 46 mmHg.  12. Chronic diastolic CHF.  13. Macular degeneration 14. Mild to moderate OSA on sleep study. He is not on CPAP.  15. Depression 16. Migraines 17. Low back pain/spinal stenosis  18. CVA: 5/17 small right internal capsule CVA with left-sided weakness.   SH: Retired Chief Financial Officer, quit smoking 1963, married, lives in [facility].  FH:  CAD  ROS: All systems reviewed and negative except as per HPI.   Current Outpatient Prescriptions  Medication Sig Dispense Refill  . acetaminophen (TYLENOL) 500 MG tablet Take 500 mg by mouth every 6 (six) hours as needed (pain).    Marland Kitchen albuterol (PROVENTIL HFA;VENTOLIN HFA) 108 (90 BASE) MCG/ACT inhaler Inhale 2 puffs into the lungs every 6 (six) hours as needed for wheezing or shortness of breath. 1 Inhaler 3  . aspirin 325 MG tablet Take 325 mg by mouth daily.    . B Complex-C-Folic Acid (NEPHRO-VITE PO) Take 1  tablet by mouth daily.      . cholecalciferol (VITAMIN D) 400 units TABS tablet Take 400 Units by mouth daily.    [name] 10 MG tablet Take 1 tablet (10 mg total) by mouth daily. 90 tablet 1  . furosemide (LASIX) 40 MG tablet Take as directed 135 tablet 3  . isosorbide mononitrate (IMDUR) 60 MG 24 hr tablet Take 1 tablet (60 mg total) by mouth daily. 30 tablet 1  . isosorbide mononitrate (IMDUR) 60 MG 24 hr tablet Take 90 mg by mouth daily.    . metoprolol succinate (TOPROL-XL) 25 MG 24 hr tablet Take 25 mg by mouth daily.    . Multiple Vitamins-Minerals (PRESERVISION/LUTEIN) CAPS Take 1 capsule by mouth 2 (two) times daily.    . nitroGLYCERIN (NITROLINGUAL) 0.4 MG/SPRAY spray PLACE 1 SPRAY UNDER THE TONGUE EVERY 5 (FIVE) MINUTES AS NEEDED. 4.9 g 12  . OVER THE COUNTER MEDICATION Place 1 drop into both eyes daily as needed (dry eyes). OTC lubricating eye drop    . pravastatin (PRAVACHOL) 40 MG tablet Take 1 tablet (40 mg total) by mouth daily. 30 tablet 1  . potassium chloride (K-DUR) 10 MEQ tablet Take 2 tablets (20 mEq total) by mouth daily. 60 tablet 3   No current facility-administered medications for this visit.    BP 132/56 mmHg  Pulse 48  Ht 5\' 9"  (1.753 m)  Wt 188 lb (85.276 kg)  BMI 27.75 kg/m2  SpO2 98% General: NAD Neck: JVP 7 cm, no thyromegaly or thyroid nodule.  Lungs: CTAB  CV: Nondisplaced PMI.  Heart regular S1/S2, no S3/S4, 3/6 SEM RUSB with some obscuring of S2.  No edema.  Bilateral carotid bruits.  Normal pedal pulses.  Abdomen: Soft, nontender, no hepatosplenomegaly, no distention.  Skin: Intact without lesions or rashes.  Neurologic: Alert and oriented x 3.  Psych: Normal affect. Extremities: No clubbing or cyanosis.   Assessment/Plan: 1. CAD: s/p CABG with long history of chronic stable angina.  He had a very difficult time with last cardiac cath (cholesterol atheroemboli and AKI) and wants cath only as last resort.  Lexiscan Cardiolite in 12/14  showed lateral MI with mild peri-infarct ischemia consistent with known occluded limb of sequential SVG-OMs (low risk).  He is now off ranolazine (told to stop by nephrology).  Rare angina now with Imdur increased to 90 mg daily. - Continue Toprol XL and Imdur at current doses.  - Continue ASA and statin.  2. Chronic diastolic CHF: EF [id number] on 5/17 echo. NYHA class III symptoms, stable. Patient looks euvolemic on current Lasix dose. - Can continue Lasix at 40 mg daily.  If he gains 2 lbs overnight or 3-4 lbs in a week, would take Lasix 40 qam/20 qpm for 3 days then back to 40 mg daily.     3. Hyperlipidemia: Good lipids 6/17.  4. CKD: Stable.    5. Aortic stenosis: Moderate  on 5/17 echo.  If AS progresses to severe, he would not be a good surgical AVR candidate but may be a TAVR candidate.  6. Carotid stenosis: Followed by [facility]. Stable LICA stenosis in [id number]. 7. OSA: Mild to moderate OSA on sleep study.  Not on CPAP at this point.   8. CVA: Right internal capsule CVA in 5/17 with residual left-sided weakness.  Neurology did not think this was cardio-embolic and LINQ monitor was not recommended.    Followup in 3 months.   [name] 07/17/2015

## 2015-07-18 ENCOUNTER — Encounter: Payer: Self-pay | Admitting: Occupational Therapy

## 2015-07-18 ENCOUNTER — Ambulatory Visit: Payer: Medicare Other | Admitting: Occupational Therapy

## 2015-07-18 DIAGNOSIS — M6281 Muscle weakness (generalized): Secondary | ICD-10-CM

## 2015-07-18 DIAGNOSIS — R2689 Other abnormalities of gait and mobility: Secondary | ICD-10-CM

## 2015-07-18 DIAGNOSIS — R278 Other lack of coordination: Secondary | ICD-10-CM

## 2015-07-18 DIAGNOSIS — R2681 Unsteadiness on feet: Secondary | ICD-10-CM

## 2015-07-18 NOTE — Patient Instructions (Signed)
Home activities for your left hand:  Do the theraputty program every day 1-2 times per day. See sheet for instructions  Coordination: Work on these 10-15 minutes at a time, 1-2 times per day.  1. Make simple bank. Pick up pennies and dimes one at a time and put them in the bank. You can also do this with small buttons or small pasta.  2. Flip cards on a time as fast you can and keep the piles as neat as you can. Once you complete the deck flip them the opposite way.

## 2015-07-18 NOTE — Therapy (Signed)
[facility] [address] [place], [place], [address] Phone: [phone]   Fax:  [phone]  Occupational Therapy Treatment  Patient Details  Name: Andrew [name] MRN: [id number] Date of Birth: 08-Apr-1923 Referring Provider: Dr. Danae Chen  Encounter Date: 07/18/2015      OT End of Session - 07/18/15 1537    Visit Number 2   Number of Visits 8   Date for OT Re-Evaluation 08/02/15   Authorization Type UHC medicare - will need G code and PN every 10th visit   Authorization Time Period 60 days   Authorization - Visit Number 2   Authorization - Number of Visits 8   OT Start Time 1316   OT Stop Time 1359   OT Time Calculation (min) 43 min   Activity Tolerance Patient tolerated treatment well      Past Medical History  Diagnosis Date  . HYPERCHOLESTEROLEMIA   . CAD, ARTERY BYPASS GRAFT   . CAROTID ARTERY STENOSIS 03/07/2010    80%  . AORTIC STENOSIS   . GASTROESOPHAGEAL REFLUX DISEASE   . Skin cancer   . History of colonic polyps     1999, 2004  . Allergic rhinitis   . Thyroid nodule 05/2010    Abnormal biopsy, 82 year old patient and his wife have made informed decision to not pursue surgical resection. Potential risk including cancer has been thoroughly discussed with the patient.  . S/P excision of acoustic neuroma   . History of prostate cancer 2002    s/p treatment with seeds / radiation  . Lumbar spinal stenosis 07/13/2009  . COPD, mild ([place]) 12/22/2010  . Pancreatic cyst 01/29/2013    Noted on MRI scan from [facility] on 07/27/2011.  I reviewed report at patient request on 01-29-2013.  "Small cystic focus in the posterior pancreatic head.  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  Follow up MRI is advised. The main pancreatic duct remains normal in appearance."  Patient do  . CHF (congestive heart failure)  ([place])   . Macular degeneration (senile) of retina 04/29/2014    Past Surgical History  Procedure Laterality Date  . Coronary artery bypass graft    . Transperineal implatation of palladium    . Cholecystectomy    . Shoulder surgery    . Total knee arthroplasty    . Cataract extraction    . Tonsillectomy and adenoidectomy  1932  . Craniectomy for excision of acoustic neuroma  1985  . Cataract extraction  2010  . Eye surgery      There were no vitals filed for this visit.      Subjective Assessment - 07/18/15 1320    Subjective  I think my balance is better and my hand   Patient is accompained by: Family member  wife   Pertinent History see epic snapshot - pt reports he gets SOB and occassional chest pain with exercise. Pt with signficant macular degeneration, L eye worse than R   Patient Stated Goals to get back to where I can walk with my cane by myself.    Currently in Pain? No/denies                      OT Treatments/Exercises (OP) - 07/18/15 0001    Exercises   Exercises Hand   Hand Exercises   Theraputty Flatten;Roll;Grip;Pinch   Theraputty - Flatten Pt  issued green putty and HEP for putty.  Pt able to return demonstrate all activities after practice.  Pt and wife both instructed due to pt's visual problems.  Provided hand out with pictures as well.  Pt and wife verbalized understanding.    Other Hand Exercises Instructed pt in activities to address fine motor coordination and in hand manipulation. Pt is limited in activity choice due to signficant macular degeneration however after instruction and practice able to return demonstrate.  Wife also instructed.     Other Hand Exercises Instruction for both programs took additional time due to premorbid visual deficits however with time pt did well.                 OT Education - 07/18/15 1536    Education provided Yes   Education Details theraputty and fine motor HEP   Person(s) Educated  Patient;Spouse   Methods Explanation;Demonstration;Verbal cues;Handout   Comprehension Verbalized understanding;Returned demonstration          OT Short Term Goals - 07/05/15 1713    OT SHORT TERM GOAL #1   Title n/a           OT Long Term Goals - 07/18/15 1536    OT LONG TERM GOAL #1   Title Pt and wife will be mod I with HEP - 08/02/2015   Status On-going   OT LONG TERM GOAL #2   Title Pt will improve grip strength in L hand by 5 pounds (baseline=60)   Status On-going   OT LONG TERM GOAL #3   Title Pt will be mod I with toilet transfers   Status On-going   OT LONG TERM GOAL #4   Title Pt will be mod I with bathing at seated position   Status On-going   OT LONG TERM GOAL #5   Title Pt will be min a with LB dresssing   Status On-going   OT LONG TERM GOAL #6   Title Pt will demonstrate ability for simple functional tasks requring bilateral manipulation of objects   Status On-going               Plan - 07/18/15 1536    Clinical Impression Statement Pt progressing toward goals. Pt reports his balance is better and he is able to use his left hand a little better.    Rehab Potential Good   OT Frequency 2x / week   OT Duration 4 weeks   OT Treatment/Interventions Self-care/ADL training;Moist Heat;DME and/or AE instruction;Energy conservation;Neuromuscular education;Therapeutic exercise;Functional Mobility Training;Manual Therapy;Therapeutic activities;Patient/family education;Balance training   Plan check HEP, balance, activity tolerance, functional mobility   Consulted and Agree with Plan of Care Patient;Family member/caregiver   Family Member Consulted wife      Patient will benefit from skilled therapeutic intervention in order to improve the following deficits and impairments:  Abnormal gait, Decreased activity tolerance, Decreased balance, Decreased coordination, Decreased mobility, Decreased knowledge of use of DME, Decreased strength, Difficulty walking,  Impaired UE functional use, Pain  Visit Diagnosis: Unsteadiness on feet  Other abnormalities of gait and mobility  Muscle weakness (generalized)  Other lack of coordination    Problem List Patient Active Problem List   Diagnosis Date Noted  . Essential hypertension   . Alcohol abuse   . Thrombocytopenia ([place])   . Right-sided cerebrovascular accident (CVA) ([place])   . Chronic renal insufficiency, stage III (moderate) 06/02/2014  . Macular degeneration (senile) of retina 04/29/2014  . 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 Major depressive disorder,  single episode 06/11/2013  . TIA (transient ischemic attack) 06/03/2013  . Pancreatic cyst 01/29/2013  . OSA (obstructive sleep apnea) 12/26/2012  . Chronic diastolic CHF (congestive heart failure) ([place]) 08/19/2012  . COPD, minimal-mild 12/22/2010  . S/P excision of acoustic neuroma 06/18/2010  . Pulmonary nodule 06/17/2010  . Vertigo 06/14/2010  . THYROID NODULE 03/13/2010  . PVD- moderate carotid disease 03/07/2010  . Spinal stenosis, lumbar region, with neurogenic claudication 07/13/2009  . ALLERGIC RHINITIS 09/01/2008  . COLONIC POLYPS, HX OF 09/01/2008  . HYPERCHOLESTEROLEMIA 06/18/2008  . Angina, class II ([place]) 06/18/2008  . Hx of CABG 06/18/2008  . Moderate aortic stenosis 06/18/2008  . GASTROESOPHAGEAL REFLUX DISEASE 06/18/2008    Quay Burow, OTR/L 07/18/2015, 3:39 PM  [facility] [address] [place], [place], [address] Phone: [phone]   Fax:  [phone]  Name: Andrew [name] MRN: [id number] Date of Birth: 04-26-1923

## 2015-07-19 ENCOUNTER — Encounter: Payer: Self-pay | Admitting: Physical Therapy

## 2015-07-19 ENCOUNTER — Ambulatory Visit: Payer: Medicare Other | Admitting: Physical Therapy

## 2015-07-19 DIAGNOSIS — R278 Other lack of coordination: Secondary | ICD-10-CM

## 2015-07-19 DIAGNOSIS — R2681 Unsteadiness on feet: Secondary | ICD-10-CM

## 2015-07-19 DIAGNOSIS — M6281 Muscle weakness (generalized): Secondary | ICD-10-CM

## 2015-07-19 DIAGNOSIS — R2689 Other abnormalities of gait and mobility: Secondary | ICD-10-CM

## 2015-07-21 NOTE — Therapy (Signed)
[facility] [address] [place], [place], [address] Phone: [phone]   Fax:  [phone]  Physical Therapy Treatment  Patient Details  Name: Andrew [name] MRN: [id number] Date of Birth: [birth year] Referring Provider: Alysia Penna, MD  Encounter Date: 07/19/2015   07/19/15 1536  PT Visits / Re-Eval  Visit Number 4  Number of Visits 17  Date for PT Re-Evaluation 09/06/15  Authorization  Authorization Type UHC MCR-G Code every 10th visit  PT Time Calculation  PT Start Time 1532  PT Stop Time 1617  PT Time Calculation (min) 45 min  PT - End of Session  Equipment Utilized During Treatment Gait belt  Activity Tolerance Patient tolerated treatment well  Behavior During Therapy [facility] for tasks assessed/performed     Past Medical History  Diagnosis Date  . HYPERCHOLESTEROLEMIA   . CAD, ARTERY BYPASS GRAFT   . CAROTID ARTERY STENOSIS 03/07/2010    80%  . AORTIC STENOSIS   . GASTROESOPHAGEAL REFLUX DISEASE   . Skin cancer   . History of colonic polyps     1999, 2004  . Allergic rhinitis   . Thyroid nodule 05/2010    Abnormal biopsy, 82 year old patient and his wife have made informed decision to not pursue surgical resection. Potential risk including cancer has been thoroughly discussed with the patient.  . S/P excision of acoustic neuroma   . History of prostate cancer 2002    s/p treatment with seeds / radiation  . Lumbar spinal stenosis 07/13/2009  . COPD, mild ([place]) 12/22/2010  . Pancreatic cyst 01/29/2013    Noted on MRI scan from [facility] on 07/27/2011.  I reviewed report at patient request on 01-29-2013.  "Small cystic focus in the posterior pancreatic head.  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  Follow up MRI is advised. The main pancreatic duct remains normal in appearance."  Patient do  . CHF  (congestive heart failure) ([place])   . Macular degeneration (senile) of retina 04/29/2014    Past Surgical History  Procedure Laterality Date  . Coronary artery bypass graft    . Transperineal implatation of palladium    . Cholecystectomy    . Shoulder surgery    . Total knee arthroplasty    . Cataract extraction    . Tonsillectomy and adenoidectomy  1932  . Craniectomy for excision of acoustic neuroma  1985  . Cataract extraction  2010  . Eye surgery      There were no vitals filed for this visit.     07/19/15 1536  Symptoms/Limitations  Subjective No new compaints. No falls or pain to report.   Patient is accompained by: Family member (spouse, Andrew [name])  Limitations Walking;House hold activities  Pain Assessment  Currently in Pain? No/denies  Pain Score 0        07/19/15 1537  Ambulation/Gait  Ambulation/Gait Yes  Ambulation/Gait Assistance 4: Min assist  Ambulation/Gait Assistance Details cues needed for sequencing with cane, cane placement so not to trip over it and for increased left foot clearance/attention to left foot position with gait. Pt did have 1 episode of right foot catching/stumbling needing assistance to correct balance                  Ambulation Distance (Feet) 230 Feet (x2 reps)  Assistive device Straight cane  Gait Pattern Step-through pattern;Decreased stride length;Decreased stance time - left;Decreased  weight shift to left;Trunk flexed;Narrow base of support;Poor foot clearance - left (occasional toe scuffing with left side)  Ambulation Surface Level;Indoor  Neuro Re-ed   Neuro Re-ed Details  single leg stance activities using tall cones: alternating fwd toe taps and cross toe taps with min to mod assist for balance. cues on posture, wide base of support and weight shifting to assist with balance and control.             PT Short Term Goals - 07/09/15 [id number]    PT SHORT TERM GOAL #1   Title Pt will initiate HEP in order to improve functional  mobility and decrease fall risk.  (Target Date: 08/05/15)   Status New   PT SHORT TERM GOAL #2   Title Pt will improve BERG balance test to 32/56 in order to indicate decreased fall risk.     Status New   PT SHORT TERM GOAL #3   Title Pt will improve gait speed to 1.55 ft/sec in order to indicate decreased fall risk and improved efficiency of gait.     Status New   PT SHORT TERM GOAL #4   Title Pt will verbalize understanding of CVA warning signs and risk factors in order to decrease time to seeking medical attention in case of future stroke.     Status New   PT SHORT TERM GOAL #5   Title Pt will ambulate 200' w/ LRAD without foot up brace at mod I level in order to indicate improved strength and attention to LLE when walking over indoor surfaces.            PT Long Term Goals - 07/09/15 [id number]    PT LONG TERM GOAL #1   Title Pt will be independent with HEP in order to indicate improved functional mobility and decreased fall risk.  (Target Date: 09/02/15)   PT LONG TERM GOAL #2   Title Pt will improve BERG balance score to >36/56 in order to indicate decreased fall risk.     Status New   PT LONG TERM GOAL #3   Title Pt will improve gait speed to 2.15 ft/sec in order to indicate decreased fall risk and improved efficiency of gait, as well as limited community ambulator.    PT LONG TERM GOAL #4   Title Pt will ambulate over indoor surfaces w/ SPC without foot up brace at mod I level in order to indicate increased independence with household ambulation.     PT LONG TERM GOAL #5   Title Pt will ambulate over paved outdoor surfaces (including ramp and curb) w/ SPC without foot up brace at mod I level in order to indicate increased independence in community.          07/19/15 1536  Plan  Clinical Impression Statement Today's session addressed gait with cane and single leg stance balance without any issues reported. Pt needs up to min assist for balance for gait with straight cane and  continued instruction before using in home. Recommended he keep using RW for safety at this time. Pt is progressing toward goals.  Pt will benefit from skilled therapeutic intervention in order to improve on the following deficits Abnormal gait;Decreased activity tolerance;Decreased balance;Decreased coordination;Decreased endurance;Decreased mobility;Decreased strength;Difficulty walking;Dizziness;Impaired perceived functional ability;Impaired UE functional use;Impaired vision/preception;Improper body mechanics;Postural dysfunction  Rehab Potential Good  Clinical Impairments Affecting Rehab Potential hx of macular degeneration and acoustic neuroma  PT Frequency 2x / week  PT Duration 8 weeks  PT Treatment/Interventions ADLs/Self Care  Home Management;Electrical Stimulation;DME Instruction;Gait training;Stair training;Functional mobility training;Therapeutic activities;Therapeutic exercise;Balance training;Neuromuscular re-education;Patient/family education;Orthotic Fit/Training;Energy conservation;Vestibular;Visual/perceptual remediation/compensation  PT Next Visit Plan Look at vestibular deficits more closely (hx of acoustic neuroma) as needed,  gait w/ SPC, high level balance activities  Consulted and Agree with Plan of Care Patient;Family member/caregiver  Family Member Consulted Andrew [name]       Patient will benefit from skilled therapeutic intervention in order to improve the following deficits and impairments:  Abnormal gait, Decreased activity tolerance, Decreased balance, Decreased coordination, Decreased endurance, Decreased mobility, Decreased strength, Difficulty walking, Dizziness, Impaired perceived functional ability, Impaired UE functional use, Impaired vision/preception, Improper body mechanics, Postural dysfunction  Visit Diagnosis: Unsteadiness on feet  Other abnormalities of gait and mobility  Muscle weakness (generalized)  Other lack of coordination     Problem  List Patient Active Problem List   Diagnosis Date Noted  . Essential hypertension   . Alcohol abuse   . Thrombocytopenia ([place])   . Right-sided cerebrovascular accident (CVA) ([place])   . Chronic renal insufficiency, stage III (moderate) 06/02/2014  . Macular degeneration (senile) of retina 04/29/2014  . 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 Major depressive disorder, single episode 06/11/2013  . TIA (transient ischemic attack) 06/03/2013  . Pancreatic cyst 01/29/2013  . OSA (obstructive sleep apnea) 12/26/2012  . Chronic diastolic CHF (congestive heart failure) ([place]) 08/19/2012  . COPD, minimal-mild 12/22/2010  . S/P excision of acoustic neuroma 06/18/2010  . Pulmonary nodule 06/17/2010  . Vertigo 06/14/2010  . THYROID NODULE 03/13/2010  . PVD- moderate carotid disease 03/07/2010  . Spinal stenosis, lumbar region, with neurogenic claudication 07/13/2009  . ALLERGIC RHINITIS 09/01/2008  . COLONIC POLYPS, HX OF 09/01/2008  . HYPERCHOLESTEROLEMIA 06/18/2008  . Angina, class II ([place]) 06/18/2008  . Hx of CABG 06/18/2008  . Moderate aortic stenosis 06/18/2008  . GASTROESOPHAGEAL REFLUX DISEASE 06/18/2008    Willow Ora, PTA, [facility] [address], [place], [place] [address] [phone] 07/21/2015, 1:05 PM   Name: Andrew [name] MRN: [id number] Date of Birth: 14-Sep-1923

## 2015-07-22 ENCOUNTER — Encounter: Payer: Self-pay | Admitting: Rehabilitation

## 2015-07-22 ENCOUNTER — Ambulatory Visit: Payer: Medicare Other | Admitting: Rehabilitation

## 2015-07-22 ENCOUNTER — Telehealth: Payer: Self-pay | Admitting: Rehabilitation

## 2015-07-22 DIAGNOSIS — R2681 Unsteadiness on feet: Secondary | ICD-10-CM | POA: Diagnosis not present

## 2015-07-22 DIAGNOSIS — M6281 Muscle weakness (generalized): Secondary | ICD-10-CM

## 2015-07-22 DIAGNOSIS — I639 Cerebral infarction, unspecified: Secondary | ICD-10-CM

## 2015-07-22 DIAGNOSIS — R2689 Other abnormalities of gait and mobility: Secondary | ICD-10-CM

## 2015-07-22 NOTE — Telephone Encounter (Signed)
Dr. Letta Pate,   I am working with Andrew [name] in OP PT.  We are progressing him to a [facility] in therapy and feel he will progress to using this device at home soon.  Could you please place order for [facility] and we can provide [facility] for pt from our supply closet that [employer] provides for [place].    Thanks so much! Cameron Sprang, PT, MPT [facility] [address] [place], [place], [address] Phone: [phone]   Fax:  [phone] 07/22/2015, 8:15 PM

## 2015-07-22 NOTE — Therapy (Signed)
[facility] [address] [place], [place], [address] Phone: [phone]   Fax:  [phone]  Physical Therapy Treatment  Patient Details  Name: Andrew [name] MRN: [id number] Date of Birth: [birth year] Referring Provider: Alysia Penna, MD  Encounter Date: 07/22/2015      PT End of Session - 07/22/15 1324    Visit Number 5   Number of Visits 17   Date for PT Re-Evaluation 09/06/15   Authorization Type UHC MCR-G Code every 10th visit   PT Start Time 1316   PT Stop Time 1401   PT Time Calculation (min) 45 min   Equipment Utilized During Treatment Gait belt   Activity Tolerance Patient tolerated treatment well   Behavior During Therapy [facility] for tasks assessed/performed      Past Medical History  Diagnosis Date  . HYPERCHOLESTEROLEMIA   . CAD, ARTERY BYPASS GRAFT   . CAROTID ARTERY STENOSIS 03/07/2010    80%  . AORTIC STENOSIS   . GASTROESOPHAGEAL REFLUX DISEASE   . Skin cancer   . History of colonic polyps     1999, 2004  . Allergic rhinitis   . Thyroid nodule 05/2010    Abnormal biopsy, 82 year old patient and his wife have made informed decision to not pursue surgical resection. Potential risk including cancer has been thoroughly discussed with the patient.  . S/P excision of acoustic neuroma   . History of prostate cancer 2002    s/p treatment with seeds / radiation  . Lumbar spinal stenosis 07/13/2009  . COPD, mild ([place]) 12/22/2010  . Pancreatic cyst 01/29/2013    Noted on MRI scan from [facility] on 07/27/2011.  I reviewed report at patient request on 01-29-2013.  "Small cystic focus in the posterior pancreatic head.  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  Follow up MRI is advised. The main pancreatic duct remains normal in appearance."  Patient do  . CHF (congestive heart failure) ([place])   . Macular  degeneration (senile) of retina 04/29/2014    Past Surgical History  Procedure Laterality Date  . Coronary artery bypass graft    . Transperineal implatation of palladium    . Cholecystectomy    . Shoulder surgery    . Total knee arthroplasty    . Cataract extraction    . Tonsillectomy and adenoidectomy  1932  . Craniectomy for excision of acoustic neuroma  1985  . Cataract extraction  2010  . Eye surgery      There were no vitals filed for this visit.      Subjective Assessment - 07/22/15 1323    Subjective Pt reports no changes since last visit, no falls.     Patient is accompained by: Family member   Limitations Walking;House hold activities   Patient Stated Goals "I want to get rid of the walker and get back to the cane."    Currently in Pain? No/denies              Gait:  Focused on use of SPC over indoor surfaces.  Pt ambulated x 230' x 1 and 115' x 1 with SPC.  Performed first bout of gait with L foot up brace.  Note that pt tends to take shorter steps on LLE intermittent and "catch" foot esp when making turns.  Once pt seated for rest break, assessed L ankle strength and note pts strength to  be 4/5 in L ankle DF, therefore removed foot up brace for remainder of session.  Note improved fluidity of gait and ability to increase L step length.  Discussed that without brace, pt would have to increase attention to LLE during gait to prevent falls.  Pt and wife verbalized understanding.  Performed gait over carpet, concrete flooring and through tight spaces to better simulate home environment.  Also performed figure 8 pattern to better address LOB when making turns.  Provided cues for increased step length of outside LE when turning and keeping wider BOS to maintain balance.  Discussed getting order for [facility] from MD in order to progress to cane eventually at home.  Pt and wife verbalized understanding.  Performed gait x 200' over paved unlevel outdoor surfaces at min A level.   Note difficulty in changes to surfaces (uphill/downhill) due to balance and also vision.  Provided cues for upcoming changes in surface.  Negotiated up/down curb step during session x 2 reps with cues for continued step to prevent LOB and sequencing/technique with SPC.                     PT Education - 07/22/15 1324    Education provided Yes   Education Details Discussed getting order for [facility] for eventual home use.     Person(s) Educated Patient;Spouse   Methods Explanation   Comprehension Verbalized understanding          PT Short Term Goals - 07/09/15 0918    PT SHORT TERM GOAL #1   Title Pt will initiate HEP in order to improve functional mobility and decrease fall risk.  (Target Date: 08/05/15)   Status New   PT SHORT TERM GOAL #2   Title Pt will improve BERG balance test to 32/56 in order to indicate decreased fall risk.     Status New   PT SHORT TERM GOAL #3   Title Pt will improve gait speed to 1.55 ft/sec in order to indicate decreased fall risk and improved efficiency of gait.     Status New   PT SHORT TERM GOAL #4   Title Pt will verbalize understanding of CVA warning signs and risk factors in order to decrease time to seeking medical attention in case of future stroke.     Status New   PT SHORT TERM GOAL #5   Title Pt will ambulate 200' w/ LRAD without foot up brace at mod I level in order to indicate improved strength and attention to LLE when walking over indoor surfaces.            PT Long Term Goals - 07/09/15 [id number]    PT LONG TERM GOAL #1   Title Pt will be independent with HEP in order to indicate improved functional mobility and decreased fall risk.  (Target Date: 09/02/15)   PT LONG TERM GOAL #2   Title Pt will improve BERG balance score to >36/56 in order to indicate decreased fall risk.     Status New   PT LONG TERM GOAL #3   Title Pt will improve gait speed to 2.15 ft/sec in order to indicate decreased fall risk and improved efficiency of  gait, as well as limited community ambulator.    PT LONG TERM GOAL #4   Title Pt will ambulate over indoor surfaces w/ SPC without foot up brace at mod I level in order to indicate increased independence with household ambulation.     PT LONG TERM GOAL #  5   Title Pt will ambulate over paved outdoor surfaces (including ramp and curb) w/ SPC without foot up brace at mod I level in order to indicate increased independence in community.                 Plan - 07/22/15 1325    Clinical Impression Statement Skilled session focused on gait with SPC over varying indoor surfaces, negotatiating obstacles, and gait outdoors with SPC.  Progressing well, however requires min/guard to min A, esp when making turns.     Rehab Potential Good   Clinical Impairments Affecting Rehab Potential hx of macular degeneration and acoustic neuroma   PT Frequency 2x / week   PT Duration 8 weeks   PT Treatment/Interventions ADLs/Self Care Home Management;Electrical Stimulation;DME Instruction;Gait training;Stair training;Functional mobility training;Therapeutic activities;Therapeutic exercise;Balance training;Neuromuscular re-education;Patient/family education;Orthotic Fit/Training;Energy conservation;Vestibular;Visual/perceptual remediation/compensation   PT Next Visit Plan Look at vestibular deficits more closely (hx of acoustic neuroma) as needed,  gait w/ SPC, high level balance activities   Consulted and Agree with Plan of Care Patient;Family member/caregiver   Family Member Consulted Rod Holler      Patient will benefit from skilled therapeutic intervention in order to improve the following deficits and impairments:  Abnormal gait, Decreased activity tolerance, Decreased balance, Decreased coordination, Decreased endurance, Decreased mobility, Decreased strength, Difficulty walking, Dizziness, Impaired perceived functional ability, Impaired UE functional use, Impaired vision/preception, Improper body mechanics,  Postural dysfunction  Visit Diagnosis: Unsteadiness on feet  Other abnormalities of gait and mobility  Muscle weakness (generalized)     Problem List Patient Active Problem List   Diagnosis Date Noted  . Essential hypertension   . Alcohol abuse   . Thrombocytopenia ([place])   . Right-sided cerebrovascular accident (CVA) ([place])   . Chronic renal insufficiency, stage III (moderate) 06/02/2014  . Macular degeneration (senile) of retina 04/29/2014  . 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 Major depressive disorder, single episode 06/11/2013  . TIA (transient ischemic attack) 06/03/2013  . Pancreatic cyst 01/29/2013  . OSA (obstructive sleep apnea) 12/26/2012  . Chronic diastolic CHF (congestive heart failure) ([place]) 08/19/2012  . COPD, minimal-mild 12/22/2010  . S/P excision of acoustic neuroma 06/18/2010  . Pulmonary nodule 06/17/2010  . Vertigo 06/14/2010  . THYROID NODULE 03/13/2010  . PVD- moderate carotid disease 03/07/2010  . Spinal stenosis, lumbar region, with neurogenic claudication 07/13/2009  . ALLERGIC RHINITIS 09/01/2008  . COLONIC POLYPS, HX OF 09/01/2008  . HYPERCHOLESTEROLEMIA 06/18/2008  . Angina, class II ([place]) 06/18/2008  . Hx of CABG 06/18/2008  . Moderate aortic stenosis 06/18/2008  . GASTROESOPHAGEAL REFLUX DISEASE 06/18/2008    Cameron Sprang, PT, MPT [facility] [address] [place], [place], [address] Phone: [phone]   Fax:  [phone] 07/22/2015, 8:06 PM  Name: Andrew [name] MRN: [id number] Date of Birth: 20-May-1923

## 2015-07-25 ENCOUNTER — Ambulatory Visit: Payer: Medicare Other | Admitting: Rehabilitation

## 2015-07-25 ENCOUNTER — Encounter: Payer: Self-pay | Admitting: Occupational Therapy

## 2015-07-25 ENCOUNTER — Encounter: Payer: Self-pay | Admitting: Rehabilitation

## 2015-07-25 ENCOUNTER — Ambulatory Visit: Payer: Medicare Other | Attending: Physical Medicine & Rehabilitation | Admitting: Occupational Therapy

## 2015-07-25 ENCOUNTER — Telehealth: Payer: Self-pay | Admitting: Cardiology

## 2015-07-25 DIAGNOSIS — M6281 Muscle weakness (generalized): Secondary | ICD-10-CM

## 2015-07-25 DIAGNOSIS — R278 Other lack of coordination: Secondary | ICD-10-CM | POA: Diagnosis present

## 2015-07-25 DIAGNOSIS — R2689 Other abnormalities of gait and mobility: Secondary | ICD-10-CM

## 2015-07-25 DIAGNOSIS — R2681 Unsteadiness on feet: Secondary | ICD-10-CM | POA: Diagnosis not present

## 2015-07-25 NOTE — Telephone Encounter (Signed)
LMTCB

## 2015-07-25 NOTE — Patient Instructions (Signed)
Balance HEP issued, refer to below for details: In corner with chair in front of you for safety:    Feet Together, Varied Arm Positions - Eyes Closed    Stand with feet together and arms as needed for balance/at sides. Close eyes and visualize upright position. Hold __15_ seconds. Repeat __3__ times per session. Do __1-2__ sessions per day.  Copyright  VHI. All rights reserved.    Feet Together, Head Motion - Eyes Open    With eyes open, feet together, move head slowly:  1. Up and down. 2. Left and right 3. Diagonal both ways Repeat __10__ times each one. Do _1-2_ sessions per day.  Copyright  VHI. All rights reserved.    At countertop/sturdy table for balance support:    Single Leg - Eyes Open    Holding support, lift right leg while maintaining balance over other leg. Progress to removing hands from support surface for longer periods of time. Hold__10-15__ seconds. Repeat with standing on right leg and lifting up left leg. Repeat _3_ times with each leg. Do _1-2_ sessions per day.  Copyright  VHI. All rights reserved.   Tandem Stance    Holding onto counter top with both hands:  Right foot in front of left, in a straight line. Balance in this position _10-15__ seconds. Then stand with left foot in front of right foot and hold this for 10-15 seconds. Perform 3 reps with each foot in the front. 1-2 times a day.  Copyright  VHI. All rights reserved.   Side-Stepping    Holding the countertop with both hands: Side step toward one side and then side step the other way back to the start position. Perform 3 laps each way. Perform 1-2 times a day. Copyright  VHI. All rights reserved.   "I love a Parade" Lift    With right hand on countertop for balance and wife/grandson with you as well: High knee marching while walking forward along counter top and the high knee marching while walking backwards to start position. Repeat __3_ times each way. Do  __1-2__ sessions per day  http://[email or web address].exer.us/344   Copyright  VHI. All rights reserved.

## 2015-07-25 NOTE — Telephone Encounter (Signed)
Spoke with patients wife scheduling follow up appt with Dr Aundra Dubin, she is aware it will be October and will call back in a couple of weeks to see if October schedule is out . I did offer to schedule appt with PA/NP prior to that and pt's wife was comfortable with scheduling with Dr Aundra Dubin when October schedule is available.

## 2015-07-25 NOTE — Therapy (Signed)
[facility] [address] [place], [place], [address] Phone: [phone]   Fax:  [phone]  Physical Therapy Treatment  Patient Details  Name: Andrew [name] MRN: [id number] Date of Birth: 1923-04-16 Referring Provider: Alysia Penna, MD  Encounter Date: 07/25/2015      PT End of Session - 07/25/15 1151    Visit Number 6   Number of Visits 17   Date for PT Re-Evaluation 09/06/15   Authorization Type UHC MCR-G Code every 10th visit   PT Start Time 1147   PT Stop Time 1231   PT Time Calculation (min) 44 min   Equipment Utilized During Treatment Gait belt   Activity Tolerance Patient tolerated treatment well   Behavior During Therapy [facility] for tasks assessed/performed      Past Medical History  Diagnosis Date  . HYPERCHOLESTEROLEMIA   . CAD, ARTERY BYPASS GRAFT   . CAROTID ARTERY STENOSIS 03/07/2010    80%  . AORTIC STENOSIS   . GASTROESOPHAGEAL REFLUX DISEASE   . Skin cancer   . History of colonic polyps     1999, 2004  . Allergic rhinitis   . Thyroid nodule 05/2010    Abnormal biopsy, 82 year old patient and his wife have made informed decision to not pursue surgical resection. Potential risk including cancer has been thoroughly discussed with the patient.  . S/P excision of acoustic neuroma   . History of prostate cancer 2002    s/p treatment with seeds / radiation  . Lumbar spinal stenosis 07/13/2009  . COPD, mild ([place]) 12/22/2010  . Pancreatic cyst 01/29/2013    Noted on MRI scan from [facility] on 07/27/2011.  I reviewed report at patient request on 01-29-2013.  "Small cystic focus in the posterior pancreatic head.  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  Follow up MRI is advised. The main pancreatic duct remains normal in appearance."  Patient do  . CHF (congestive heart failure) ([place])   . Macular  degeneration (senile) of retina 04/29/2014    Past Surgical History  Procedure Laterality Date  . Coronary artery bypass graft    . Transperineal implatation of palladium    . Cholecystectomy    . Shoulder surgery    . Total knee arthroplasty    . Cataract extraction    . Tonsillectomy and adenoidectomy  1932  . Craniectomy for excision of acoustic neuroma  1985  . Cataract extraction  2010  . Eye surgery      There were no vitals filed for this visit.      Subjective Assessment - 07/25/15 1150    Subjective Pt reports no changes since last visit, no falls.     Patient is accompained by: Family member   Limitations Walking;House hold activities   Patient Stated Goals "I want to get rid of the walker and get back to the cane."    Currently in Pain? No/denies           NMR:  Reviewed corner balance tasks to ensure compliance and safety, see pt instruction.  Note that corner tasks are difficult for pt and recommend that wife be present and close to pt when completing these tasks for increased safety.  Also performed tandem stance at counter top with cues to maintain UE support as pt with gross LOB without UE support.   Gait:  Performed gait throughout session over varying  indoor surfaces, through tight spaces, and making turns to better simulate home.  Note that he continues to have difficulty making turns and when in highly distracting environment.  He tends to have narrowing BOS and decreased step length on the L side.  Continued session negotiating around cones, up/down curb and ramp.  Note that he needs min A during curb and ramp x 2 reps with mod to max cues on stepping through to prevent LOB.  During gait pt still has intermittent LOB, needing up to min A to correct, usually when making turns.  Also had pt work on gait with SPC while carrying plate in LUE to simulate more functional task that he would do at home.  Pt did well at min/guard level with single instance of LOB  requiring min A to correct with cues for increased L step length.                        PT Education - 07/25/15 1345    Education provided Yes   Education Details Education on HEP for balance (compliance and assist from wife for feet together and EC tasks)   Person(s) Educated Patient   Methods Explanation   Comprehension Verbalized understanding          PT Short Term Goals - 07/09/15 0918    PT SHORT TERM GOAL #1   Title Pt will initiate HEP in order to improve functional mobility and decrease fall risk.  (Target Date: 08/05/15)   Status New   PT SHORT TERM GOAL #2   Title Pt will improve BERG balance test to 32/56 in order to indicate decreased fall risk.     Status New   PT SHORT TERM GOAL #3   Title Pt will improve gait speed to 1.55 ft/sec in order to indicate decreased fall risk and improved efficiency of gait.     Status New   PT SHORT TERM GOAL #4   Title Pt will verbalize understanding of CVA warning signs and risk factors in order to decrease time to seeking medical attention in case of future stroke.     Status New   PT SHORT TERM GOAL #5   Title Pt will ambulate 200' w/ LRAD without foot up brace at mod I level in order to indicate improved strength and attention to LLE when walking over indoor surfaces.            PT Long Term Goals - 07/09/15 [id number]    PT LONG TERM GOAL #1   Title Pt will be independent with HEP in order to indicate improved functional mobility and decreased fall risk.  (Target Date: 09/02/15)   PT LONG TERM GOAL #2   Title Pt will improve BERG balance score to >36/56 in order to indicate decreased fall risk.     Status New   PT LONG TERM GOAL #3   Title Pt will improve gait speed to 2.15 ft/sec in order to indicate decreased fall risk and improved efficiency of gait, as well as limited community ambulator.    PT LONG TERM GOAL #4   Title Pt will ambulate over indoor surfaces w/ SPC without foot up brace at mod I level in order  to indicate increased independence with household ambulation.     PT LONG TERM GOAL #5   Title Pt will ambulate over paved outdoor surfaces (including ramp and curb) w/ SPC without foot up brace at mod I level in order  to indicate increased independence in community.                 Plan - 07/25/15 1151    Clinical Impression Statement Skilled session focused on corner balance tasks to determine if compliant and still appropriate.  Note that feet together EO and feet together EC are very difficult and would recommend that wife be present for safety during these tasks.  Also continue to perform gait with SPC, focusing on performing bimanual tasks and negotiating obstacles.     Rehab Potential Good   Clinical Impairments Affecting Rehab Potential hx of macular degeneration and acoustic neuroma   PT Frequency 2x / week   PT Duration 8 weeks   PT Treatment/Interventions ADLs/Self Care Home Management;Electrical Stimulation;DME Instruction;Gait training;Stair training;Functional mobility training;Therapeutic activities;Therapeutic exercise;Balance training;Neuromuscular re-education;Patient/family education;Orthotic Fit/Training;Energy conservation;Vestibular;Visual/perceptual remediation/compensation   PT Next Visit Plan Look at vestibular deficits more closely (hx of acoustic neuroma) as needed,  gait w/ SPC-obstacles and carrying objects, high level balance activities-SLS   Consulted and Agree with Plan of Care Patient;Family member/caregiver   Family Member Consulted Rod [place]      Patient will benefit from skilled therapeutic intervention in order to improve the following deficits and impairments:  Abnormal gait, Decreased activity tolerance, Decreased balance, Decreased coordination, Decreased endurance, Decreased mobility, Decreased strength, Difficulty walking, Dizziness, Impaired perceived functional ability, Impaired UE functional use, Impaired vision/preception, Improper body mechanics,  Postural dysfunction  Visit Diagnosis: Unsteadiness on feet  Other abnormalities of gait and mobility  Muscle weakness (generalized)     Problem List Patient Active Problem List   Diagnosis Date Noted  . Essential hypertension   . Alcohol abuse   . Thrombocytopenia ([place])   . Right-sided cerebrovascular accident (CVA) ([place])   . Chronic renal insufficiency, stage III (moderate) 06/02/2014  . Macular degeneration (senile) of retina 04/29/2014  . 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 Major depressive disorder, single episode 06/11/2013  . TIA (transient ischemic attack) 06/03/2013  . Pancreatic cyst 01/29/2013  . OSA (obstructive sleep apnea) 12/26/2012  . Chronic diastolic CHF (congestive heart failure) ([place]) 08/19/2012  . COPD, minimal-mild 12/22/2010  . S/P excision of acoustic neuroma 06/18/2010  . Pulmonary nodule 06/17/2010  . Vertigo 06/14/2010  . THYROID NODULE 03/13/2010  . PVD- moderate carotid disease 03/07/2010  . Spinal stenosis, lumbar region, with neurogenic claudication 07/13/2009  . ALLERGIC RHINITIS 09/01/2008  . COLONIC POLYPS, HX OF 09/01/2008  . HYPERCHOLESTEROLEMIA 06/18/2008  . Angina, class II ([place]) 06/18/2008  . Hx of CABG 06/18/2008  . Moderate aortic stenosis 06/18/2008  . GASTROESOPHAGEAL REFLUX DISEASE 06/18/2008    Cameron Sprang, PT, MPT [facility] [address] [place], [place], [address] Phone: [phone]   Fax:  [phone] 07/25/2015, 1:50 PM  Name: Andrew [name] MRN: [id number] Date of Birth: 11-14-1923

## 2015-07-25 NOTE — Telephone Encounter (Signed)
New MEssage  Pt wife calling to speak w/ RN about appt from 6/23. Please call back and discuss.

## 2015-07-25 NOTE — Therapy (Signed)
[facility] [address] [place], [place], [address] Phone: [phone]   Fax:  [phone]  Occupational Therapy Treatment  Patient Details  Name: Andrew [name] MRN: [id number] Date of Birth: [birth year] Referring Provider: Dr. Danae Chen  Encounter Date: 07/25/2015      OT End of Session - 07/25/15 1301    Visit Number 3   Number of Visits 8   Date for OT Re-Evaluation 08/02/15   Authorization Type UHC medicare - will need G code and PN every 10th visit   Authorization Time Period 60 days   Authorization - Visit Number 3   OT Start Time 1101   OT Stop Time 1142   OT Time Calculation (min) 41 min   Activity Tolerance Patient tolerated treatment well      Past Medical History  Diagnosis Date  . HYPERCHOLESTEROLEMIA   . CAD, ARTERY BYPASS GRAFT   . CAROTID ARTERY STENOSIS 03/07/2010    80%  . AORTIC STENOSIS   . GASTROESOPHAGEAL REFLUX DISEASE   . Skin cancer   . History of colonic polyps     1999, 2004  . Allergic rhinitis   . Thyroid nodule 05/2010    Abnormal biopsy, 82 year old patient and his wife have made informed decision to not pursue surgical resection. Potential risk including cancer has been thoroughly discussed with the patient.  . S/P excision of acoustic neuroma   . History of prostate cancer 2002    s/p treatment with seeds / radiation  . Lumbar spinal stenosis 07/13/2009  . COPD, mild ([place]) 12/22/2010  . Pancreatic cyst 01/29/2013    Noted on MRI scan from [facility] on 07/27/2011.  I reviewed report at patient request on 01-29-2013.  "Small cystic focus in the posterior pancreatic head.  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  Follow up MRI is advised. The main pancreatic duct remains normal in appearance."  Patient do  . CHF (congestive heart failure) ([place])   . Macular degeneration  (senile) of retina 04/29/2014    Past Surgical History  Procedure Laterality Date  . Coronary artery bypass graft    . Transperineal implatation of palladium    . Cholecystectomy    . Shoulder surgery    . Total knee arthroplasty    . Cataract extraction    . Tonsillectomy and adenoidectomy  1932  . Craniectomy for excision of acoustic neuroma  1985  . Cataract extraction  2010  . Eye surgery      There were no vitals filed for this visit.      Subjective Assessment - 07/25/15 1107    Subjective  I think my hand is pretty good now   Patient is accompained by: Family member  wife   Pertinent History see epic snapshot - pt reports he gets SOB and occassional chest pain with exercise. Pt with signficant macular degeneration, L eye worse than R   Patient Stated Goals to get back to where I can walk with my cane by myself.    Currently in Pain? No/denies                      OT Treatments/Exercises (OP) - 07/25/15 0001    Neurological Re-education Exercises   Other Exercises 1 Neuro re ed to address bilateral functional use of both hands with emphasis on manipulating small objects and activities requiring  sustained activity and coordination. Pt completed all activities without difficulty. Pt has gained 6 pounds of strength in L hand.  Also addressed dynamic standing balance, weight shifting, stops and starts, turns and navigation, balance reactions using straight cane (pt uses walker at home at this time). Pt with 3 episodes of LOB however able to recoup balance with just close supervision.                   OT Short Term Goals - 07/05/15 1713    OT SHORT TERM GOAL #1   Title n/a           OT Long Term Goals - 08-08-2015 1300    OT LONG TERM GOAL #1   Title Pt and wife will be mod I with HEP - 08/02/2015   Status Achieved   OT LONG TERM GOAL #2   Title Pt will improve grip strength in L hand by 5 pounds (baseline=60)   Status Achieved  66   OT LONG  TERM GOAL #3   Title Pt will be mod I with toilet transfers   Status Achieved   OT LONG TERM GOAL #4   Title Pt will be mod I with bathing at seated position   Status Achieved   OT LONG TERM GOAL #5   Title Pt will be min a with LB dresssing   Status Achieved  mod I   OT LONG TERM GOAL #6   Title Pt will demonstrate ability for simple functional tasks requring bilateral manipulation of objects   Status Achieved               Plan - 2015-08-08 1301    Clinical Impression Statement Pt has met all LTG's and is ready for d/c from OT.  Pt and wife in agreement   Rehab Potential Good   OT Frequency 2x / week   OT Duration 4 weeks   OT Treatment/Interventions Self-care/ADL training;Moist Heat;DME and/or AE instruction;Energy conservation;Neuromuscular education;Therapeutic exercise;Functional Mobility Training;Manual Therapy;Therapeutic activities;Patient/family education;Balance training   Plan d/c from [facility] and Agree with Plan of Care Patient;Family member/caregiver   Family Member Consulted wife      Patient will benefit from skilled therapeutic intervention in order to improve the following deficits and impairments:  Abnormal gait, Decreased activity tolerance, Decreased balance, Decreased coordination, Decreased mobility, Decreased knowledge of use of DME, Decreased strength, Difficulty walking, Impaired UE functional use, Pain  Visit Diagnosis: Unsteadiness on feet  Other abnormalities of gait and mobility  Other lack of coordination  Muscle weakness (generalized)      G-Codes - 2015-08-08 1303    Functional Assessment Tool Used clinical observation (pt with macular degeneration therefore not able to do 9 hole peg)   Functional Limitation Self care   Self Care Current Status (V9563) At least 1 percent but less than 20 percent impaired, limited or restricted   Self Care Goal Status (O7564) At least 1 percent but less than 20 percent impaired, limited or  restricted   Self Care Discharge Status [id number]) At least 1 percent but less than 20 percent impaired, limited or restricted      Problem List Patient Active Problem List   Diagnosis Date Noted  . Essential hypertension   . Alcohol abuse   . Thrombocytopenia ([place])   . Right-sided cerebrovascular accident (CVA) ([place])   . Chronic renal insufficiency, stage III (moderate) 06/02/2014  . Macular degeneration (senile) of retina 04/29/2014  . 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  Major depressive disorder, single episode 06/11/2013  . TIA (transient ischemic attack) 06/03/2013  . Pancreatic cyst 01/29/2013  . OSA (obstructive sleep apnea) 12/26/2012  . Chronic diastolic CHF (congestive heart failure) ([place]) 08/19/2012  . COPD, minimal-mild 12/22/2010  . S/P excision of acoustic neuroma 06/18/2010  . Pulmonary nodule 06/17/2010  . Vertigo 06/14/2010  . THYROID NODULE 03/13/2010  . PVD- moderate carotid disease 03/07/2010  . Spinal stenosis, lumbar region, with neurogenic claudication 07/13/2009  . ALLERGIC RHINITIS 09/01/2008  . COLONIC POLYPS, HX OF 09/01/2008  . HYPERCHOLESTEROLEMIA 06/18/2008  . Angina, class II ([place]) 06/18/2008  . Hx of CABG 06/18/2008  . Moderate aortic stenosis 06/18/2008  . GASTROESOPHAGEAL REFLUX DISEASE 06/18/2008   OCCUPATIONAL THERAPY DISCHARGE SUMMARY  Visits from Start of Care: 3  Current functional level related to goals / functional outcomes: See above   Remaining deficits: Functional ambulation, balance - PT continuing to address   Education / Equipment: HEP Plan: Patient agrees to discharge.  Patient goals were met. Patient is being discharged due to meeting the stated rehab goals.  ?????     Quay Burow, OTR/L 07/25/2015, 1:04 PM  [facility] [address] [place], [place], [address] Phone: [phone]   Fax:  [phone]  Name: Andrew [name] MRN: [id number] Date of  Birth: Jun 21, 1923

## 2015-07-25 NOTE — Telephone Encounter (Signed)
Follow Up:; ° ° °Returning your call. °

## 2015-07-28 ENCOUNTER — Ambulatory Visit: Payer: Medicare Other | Admitting: Rehabilitation

## 2015-08-01 ENCOUNTER — Encounter: Payer: Medicare Other | Admitting: Occupational Therapy

## 2015-08-01 ENCOUNTER — Encounter: Payer: Self-pay | Admitting: Rehabilitation

## 2015-08-01 ENCOUNTER — Ambulatory Visit: Payer: Medicare Other | Admitting: Rehabilitation

## 2015-08-01 DIAGNOSIS — R2689 Other abnormalities of gait and mobility: Secondary | ICD-10-CM

## 2015-08-01 DIAGNOSIS — R2681 Unsteadiness on feet: Secondary | ICD-10-CM

## 2015-08-01 DIAGNOSIS — M6281 Muscle weakness (generalized): Secondary | ICD-10-CM

## 2015-08-01 NOTE — Telephone Encounter (Signed)
Order placed and faxed to neuro rehab

## 2015-08-01 NOTE — Therapy (Signed)
[facility] [address] [place], [place], [address] Phone: [phone]   Fax:  [phone]  Physical Therapy Treatment  Patient Details  Name: Andrew [name] MRN: [id number] Date of Birth: Feb 13, 1923 Referring Provider: Alysia Penna, MD  Encounter Date: 08/01/2015      PT End of Session - 08/01/15 1634    Visit Number 7   Number of Visits 17   Date for PT Re-Evaluation 09/06/15   Authorization Type UHC MCR-G Code every 10th visit   PT Start Time 1403   PT Stop Time 1447   PT Time Calculation (min) 44 min   Equipment Utilized During Treatment Gait belt   Activity Tolerance Patient tolerated treatment well   Behavior During Therapy [facility] for tasks assessed/performed      Past Medical History  Diagnosis Date  . HYPERCHOLESTEROLEMIA   . CAD, ARTERY BYPASS GRAFT   . CAROTID ARTERY STENOSIS 03/07/2010    80%  . AORTIC STENOSIS   . GASTROESOPHAGEAL REFLUX DISEASE   . Skin cancer   . History of colonic polyps     1999, 2004  . Allergic rhinitis   . Thyroid nodule 05/2010    Abnormal biopsy, 82 year old patient and his wife have made informed decision to not pursue surgical resection. Potential risk including cancer has been thoroughly discussed with the patient.  . S/P excision of acoustic neuroma   . History of prostate cancer 2002    s/p treatment with seeds / radiation  . Lumbar spinal stenosis 07/13/2009  . COPD, mild ([place]) 12/22/2010  . Pancreatic cyst 01/29/2013    Noted on MRI scan from [facility] on 07/27/2011.  I reviewed report at patient request on 01-29-2013.  "Small cystic focus in the posterior pancreatic head.  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  Follow up MRI is advised. The main pancreatic duct remains normal in appearance."  Patient do  . CHF (congestive heart failure) ([place])   . Macular  degeneration (senile) of retina 04/29/2014    Past Surgical History  Procedure Laterality Date  . Coronary artery bypass graft    . Transperineal implatation of palladium    . Cholecystectomy    . Shoulder surgery    . Total knee arthroplasty    . Cataract extraction    . Tonsillectomy and adenoidectomy  1932  . Craniectomy for excision of acoustic neuroma  1985  . Cataract extraction  2010  . Eye surgery      There were no vitals filed for this visit.      Subjective Assessment - 08/01/15 1413    Subjective "My daughter took me through my exercises over the weekend. "    Patient is accompained by: Family member   Limitations Walking;House hold activities   Patient Stated Goals "I want to get rid of the walker and get back to the cane."    Currently in Pain? No/denies               NMR:  Worked on high level balance, stepping strategy and SLS during session with these activities; braiding in // bars with BUE support.  Performed x 3 reps down bars and back with cues for posture and increased step, esp on the LLE.  Performed alternating cone taps x 12 reps progressing to tipping cone over and back upright to increase time spent in SLS.  Note  some coordination difficulty with LLE, however with cues was able to correct during session.  Requires min facilitation at times to increase weight shift R or L, also provided cues for this throughout.  Ended session with rocker board task (small board) in // bars facing forward while maintaining balance x 2 reps of 30 seconds without UE support.  Requires min/guard with cues for upright posture and to maintain equal WB throughout with improvement noted during second rep.    Gait:  Performed gait with SPC over paved outdoor surfaces x 250' at min A level.  Note that despite cues for changes in surfaces (uphill/downhill) he still continues to demonstrate poor balance with decreased ability to use stepping strategy, esp on the L to maintain  balance and has LOB anteriorly.  Performed curb step x 2 reps during session with cane at min A level each time with cues for sequencing, stepping through to maintain balance and leading with LLE to avoid L toe catching on curb step to decrease fall risk.  Discussed with pt and wife that he could begin ambulating at home with cane in order to report how pt does in home environment (due to visual deficits).  Pt and wife verbalized understanding.  Demonstrated how wife should guard pt when walking.                    PT Education - 08/01/15 1413    Education provided Yes   Education Details educated on using [place] at home with wife!!!   Person(s) Educated Patient;Spouse   Methods Explanation;Demonstration   Comprehension Verbalized understanding          PT Short Term Goals - 07/09/15 0918    PT SHORT TERM GOAL #1   Title Pt will initiate HEP in order to improve functional mobility and decrease fall risk.  (Target Date: 08/05/15)   Status New   PT SHORT TERM GOAL #2   Title Pt will improve BERG balance test to 32/56 in order to indicate decreased fall risk.     Status New   PT SHORT TERM GOAL #3   Title Pt will improve gait speed to 1.55 ft/sec in order to indicate decreased fall risk and improved efficiency of gait.     Status New   PT SHORT TERM GOAL #4   Title Pt will verbalize understanding of CVA warning signs and risk factors in order to decrease time to seeking medical attention in case of future stroke.     Status New   PT SHORT TERM GOAL #5   Title Pt will ambulate 200' w/ LRAD without foot up brace at mod I level in order to indicate improved strength and attention to LLE when walking over indoor surfaces.            PT Long Term Goals - 07/09/15 [id number]    PT LONG TERM GOAL #1   Title Pt will be independent with HEP in order to indicate improved functional mobility and decreased fall risk.  (Target Date: 09/02/15)   PT LONG TERM GOAL #2   Title Pt will improve  BERG balance score to >36/56 in order to indicate decreased fall risk.     Status New   PT LONG TERM GOAL #3   Title Pt will improve gait speed to 2.15 ft/sec in order to indicate decreased fall risk and improved efficiency of gait, as well as limited community ambulator.    PT LONG TERM GOAL #4  Title Pt will ambulate over indoor surfaces w/ SPC without foot up brace at mod I level in order to indicate increased independence with household ambulation.     PT LONG TERM GOAL #5   Title Pt will ambulate over paved outdoor surfaces (including ramp and curb) w/ SPC without foot up brace at mod I level in order to indicate increased independence in community.                 Plan - 08/01/15 1415    Clinical Impression Statement Skilled session focused on gait over paved outdoor surfaces with [facility] as well as negotiation of curb step with SPC.  Also continue to address high level balance with focus on SLS and stepping strategy.     Rehab Potential Good   Clinical Impairments Affecting Rehab Potential hx of macular degeneration and acoustic neuroma   PT Frequency 2x / week   PT Duration 8 weeks   PT Treatment/Interventions ADLs/Self Care Home Management;Electrical Stimulation;DME Instruction;Gait training;Stair training;Functional mobility training;Therapeutic activities;Therapeutic exercise;Balance training;Neuromuscular re-education;Patient/family education;Orthotic Fit/Training;Energy conservation;Vestibular;Visual/perceptual remediation/compensation   PT Next Visit Plan Begin to look at STGs!  gait w/ SPC-obstacles and carrying objects, high level balance activities-SLS   Consulted and Agree with Plan of Care Patient;Family member/caregiver   Family Member Consulted Rod Holler      Patient will benefit from skilled therapeutic intervention in order to improve the following deficits and impairments:  Abnormal gait, Decreased activity tolerance, Decreased balance, Decreased coordination, Decreased  endurance, Decreased mobility, Decreased strength, Difficulty walking, Dizziness, Impaired perceived functional ability, Impaired UE functional use, Impaired vision/preception, Improper body mechanics, Postural dysfunction  Visit Diagnosis: Unsteadiness on feet  Other abnormalities of gait and mobility  Muscle weakness (generalized)     Problem List Patient Active Problem List   Diagnosis Date Noted  . Essential hypertension   . Alcohol abuse   . Thrombocytopenia ([place])   . Right-sided cerebrovascular accident (CVA) ([place])   . Chronic renal insufficiency, stage III (moderate) 06/02/2014  . Macular degeneration (senile) of retina 04/29/2014  . 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 Major depressive disorder, single episode 06/11/2013  . TIA (transient ischemic attack) 06/03/2013  . Pancreatic cyst 01/29/2013  . OSA (obstructive sleep apnea) 12/26/2012  . Chronic diastolic CHF (congestive heart failure) ([place]) 08/19/2012  . COPD, minimal-mild 12/22/2010  . S/P excision of acoustic neuroma 06/18/2010  . Pulmonary nodule 06/17/2010  . Vertigo 06/14/2010  . THYROID NODULE 03/13/2010  . PVD- moderate carotid disease 03/07/2010  . Spinal stenosis, lumbar region, with neurogenic claudication 07/13/2009  . ALLERGIC RHINITIS 09/01/2008  . COLONIC POLYPS, HX OF 09/01/2008  . HYPERCHOLESTEROLEMIA 06/18/2008  . Angina, class II ([place]) 06/18/2008  . Hx of CABG 06/18/2008  . Moderate aortic stenosis 06/18/2008  . GASTROESOPHAGEAL REFLUX DISEASE 06/18/2008    Cameron Sprang, PT, MPT [facility] [address] [place], [place], [address] Phone: [phone]   Fax:  [phone] 08/01/2015, 4:36 PM  Name: Andrew [name] MRN: [id number] Date of Birth: Jun 09, 1923

## 2015-08-01 NOTE — Telephone Encounter (Signed)
Please order single-point cane

## 2015-08-04 ENCOUNTER — Encounter: Payer: Medicare Other | Admitting: Occupational Therapy

## 2015-08-04 ENCOUNTER — Encounter: Payer: Self-pay | Admitting: Rehabilitation

## 2015-08-04 ENCOUNTER — Telehealth: Payer: Self-pay | Admitting: Rehabilitation

## 2015-08-04 ENCOUNTER — Ambulatory Visit: Payer: Medicare Other | Admitting: Rehabilitation

## 2015-08-04 DIAGNOSIS — R2681 Unsteadiness on feet: Secondary | ICD-10-CM | POA: Diagnosis not present

## 2015-08-04 DIAGNOSIS — M6281 Muscle weakness (generalized): Secondary | ICD-10-CM

## 2015-08-04 DIAGNOSIS — R2689 Other abnormalities of gait and mobility: Secondary | ICD-10-CM

## 2015-08-04 NOTE — Therapy (Signed)
[facility] [address] [place], [place], [address] Phone: [phone]   Fax:  [phone]  Physical Therapy Treatment  Patient Details  Name: Andrew [name] MRN: [id number] Date of Birth: 04-Aug-1923 Referring Provider: Alysia Penna, MD  Encounter Date: 08/04/2015      PT End of Session - 08/04/15 1927    Visit Number 8   Number of Visits 17   Date for PT Re-Evaluation 09/06/15   Authorization Type UHC MCR-G Code every 10th visit   PT Start Time [id number]   PT Stop Time 1531   PT Time Calculation (min) 44 min   Equipment Utilized During Treatment Gait belt   Activity Tolerance Patient tolerated treatment well   Behavior During Therapy [facility] for tasks assessed/performed      Past Medical History  Diagnosis Date  . HYPERCHOLESTEROLEMIA   . CAD, ARTERY BYPASS GRAFT   . CAROTID ARTERY STENOSIS 03/07/2010    80%  . AORTIC STENOSIS   . GASTROESOPHAGEAL REFLUX DISEASE   . Skin cancer   . History of colonic polyps     1999, 2004  . Allergic rhinitis   . Thyroid nodule 05/2010    Abnormal biopsy, 82 year old patient and his wife have made informed decision to not pursue surgical resection. Potential risk including cancer has been thoroughly discussed with the patient.  . S/P excision of acoustic neuroma   . History of prostate cancer 2002    s/p treatment with seeds / radiation  . Lumbar spinal stenosis 07/13/2009  . COPD, mild ([place]) 12/22/2010  . Pancreatic cyst 01/29/2013    Noted on MRI scan from [facility] on 07/27/2011.  I reviewed report at patient request on 01-29-2013.  "Small cystic focus in the posterior pancreatic head.  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  Follow up MRI is advised. The main pancreatic duct remains normal in appearance."  Patient do  . CHF (congestive heart failure) ([place])   . Macular  degeneration (senile) of retina 04/29/2014    Past Surgical History  Procedure Laterality Date  . Coronary artery bypass graft    . Transperineal implatation of palladium    . Cholecystectomy    . Shoulder surgery    . Total knee arthroplasty    . Cataract extraction    . Tonsillectomy and adenoidectomy  1932  . Craniectomy for excision of acoustic neuroma  1985  . Cataract extraction  2010  . Eye surgery      There were no vitals filed for this visit.      Subjective Assessment - 08/04/15 1925    Subjective "We have been working on walking in the house with the cane, its going good."    Patient is accompained by: Family member   Limitations Walking;House hold activities   Patient Stated Goals "I want to get rid of the walker and get back to the cane."    Currently in Pain? No/denies             TE:  Addressed L hip weakness during session with supine SLR, SL hip abduction and standing marching tasks, see pt instruction for details on exercises and reps.    Gait: Performed 230' gait over indoor surfaces with SPC during session.  He continues to require up to min A to prevent forward LOB due to decreased clearance of LLE and decreased L stepping strategy  during LOB.  Also requires cues for placement of cane during session.  Performed curb and ramp during session x 3 reps for blocked practice of task.  Requires min A with all three tasks with cues for sequencing and  Stepping through pattern to prevent LOB.    NMR: Worked on balance during session with gait x 130' while kicking bean bag alternating LEs to work on weight shifting and modified SLS.  Requires intermittent min A with continued cues for larger steps as he tends to kick bag and then follow with small steps causing LOB.                      PT Education - 08/04/15 1926    Education provided Yes   Education Details education on hip strengthening exercises, see pt instructions.    Person(s) Educated  Patient;Spouse   Methods Explanation;Demonstration;Handout   Comprehension Verbalized understanding;Returned demonstration          PT Short Term Goals - 07/09/15 0918    PT SHORT TERM GOAL #1   Title Pt will initiate HEP in order to improve functional mobility and decrease fall risk.  (Target Date: 08/05/15)   Status New   PT SHORT TERM GOAL #2   Title Pt will improve BERG balance test to 32/56 in order to indicate decreased fall risk.     Status New   PT SHORT TERM GOAL #3   Title Pt will improve gait speed to 1.55 ft/sec in order to indicate decreased fall risk and improved efficiency of gait.     Status New   PT SHORT TERM GOAL #4   Title Pt will verbalize understanding of CVA warning signs and risk factors in order to decrease time to seeking medical attention in case of future stroke.     Status New   PT SHORT TERM GOAL #5   Title Pt will ambulate 200' w/ LRAD without foot up brace at mod I level in order to indicate improved strength and attention to LLE when walking over indoor surfaces.            PT Long Term Goals - 07/09/15 [id number]    PT LONG TERM GOAL #1   Title Pt will be independent with HEP in order to indicate improved functional mobility and decreased fall risk.  (Target Date: 09/02/15)   PT LONG TERM GOAL #2   Title Pt will improve BERG balance score to >36/56 in order to indicate decreased fall risk.     Status New   PT LONG TERM GOAL #3   Title Pt will improve gait speed to 2.15 ft/sec in order to indicate decreased fall risk and improved efficiency of gait, as well as limited community ambulator.    PT LONG TERM GOAL #4   Title Pt will ambulate over indoor surfaces w/ SPC without foot up brace at mod I level in order to indicate increased independence with household ambulation.     PT LONG TERM GOAL #5   Title Pt will ambulate over paved outdoor surfaces (including ramp and curb) w/ SPC without foot up brace at mod I level in order to indicate increased  independence in community.                 Plan - 08/04/15 1927    Clinical Impression Statement Skilled session continues to focus on gait training with SPC over indoor surfaces.  He continues to demonstrate decreased balance with poor stepping  strategy on LLE with LOB forwards during activity.  Also continue to work on balance and hip strengthening.  Added to HEP, see pt instructions.     Rehab Potential Good   Clinical Impairments Affecting Rehab Potential hx of macular degeneration and acoustic neuroma   PT Frequency 2x / week   PT Duration 8 weeks   PT Treatment/Interventions ADLs/Self Care Home Management;Electrical Stimulation;DME Instruction;Gait training;Stair training;Functional mobility training;Therapeutic activities;Therapeutic exercise;Balance training;Neuromuscular re-education;Patient/family education;Orthotic Fit/Training;Energy conservation;Vestibular;Visual/perceptual remediation/compensation   PT Next Visit Plan STGs!  gait w/ SPC-obstacles and carrying objects, high level balance activities-SLS, functional hip strength   Consulted and Agree with Plan of Care Patient;Family member/caregiver   Family Member Consulted Rod Holler      Patient will benefit from skilled therapeutic intervention in order to improve the following deficits and impairments:  Abnormal gait, Decreased activity tolerance, Decreased balance, Decreased coordination, Decreased endurance, Decreased mobility, Decreased strength, Difficulty walking, Dizziness, Impaired perceived functional ability, Impaired UE functional use, Impaired vision/preception, Improper body mechanics, Postural dysfunction  Visit Diagnosis: Unsteadiness on feet  Other abnormalities of gait and mobility  Muscle weakness (generalized)     Problem List Patient Active Problem List   Diagnosis Date Noted  . Essential hypertension   . Alcohol abuse   . Thrombocytopenia ([place])   . Right-sided cerebrovascular accident (CVA)  ([place])   . Chronic renal insufficiency, stage III (moderate) 06/02/2014  . Macular degeneration (senile) of retina 04/29/2014  . 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 Major depressive disorder, single episode 06/11/2013  . TIA (transient ischemic attack) 06/03/2013  . Pancreatic cyst 01/29/2013  . OSA (obstructive sleep apnea) 12/26/2012  . Chronic diastolic CHF (congestive heart failure) ([place]) 08/19/2012  . COPD, minimal-mild 12/22/2010  . S/P excision of acoustic neuroma 06/18/2010  . Pulmonary nodule 06/17/2010  . Vertigo 06/14/2010  . THYROID NODULE 03/13/2010  . PVD- moderate carotid disease 03/07/2010  . Spinal stenosis, lumbar region, with neurogenic claudication 07/13/2009  . ALLERGIC RHINITIS 09/01/2008  . COLONIC POLYPS, HX OF 09/01/2008  . HYPERCHOLESTEROLEMIA 06/18/2008  . Angina, class II ([place]) 06/18/2008  . Hx of CABG 06/18/2008  . Moderate aortic stenosis 06/18/2008  . GASTROESOPHAGEAL REFLUX DISEASE 06/18/2008    Cameron Sprang, PT, MPT [facility] [address] [place] [place], [place], [address] Phone: [phone]   Fax:  [phone] 08/04/2015, 7:31 PM  Name: Andrew [name] MRN: [id number] Date of Birth: 08-May-1923

## 2015-08-04 NOTE — Telephone Encounter (Signed)
Order for single point cane entered.

## 2015-08-04 NOTE — Telephone Encounter (Signed)
Hi Dr. Raliegh Ip and Corene Cornea,   Andrew [name] has been utilizing [facility] during therapy sessions and feel that he is appropriate to begin to use one at home. Please write order for [facility] and I can provide from our supply closet that [employer] supplies for [place].    Thanks,  Cameron Sprang, PT, MPT [facility] [address] [place], [place], [address] Phone: [phone]   Fax:  [phone] 08/04/2015, 7:57 AM

## 2015-08-04 NOTE — Telephone Encounter (Signed)
Ok for single point cane

## 2015-08-04 NOTE — Patient Instructions (Signed)
Hip Flexion / Knee Extension: Straight-Leg Raise (Eccentric)    Lie on back. Lift leg with knee straight. Slowly lower leg for 3-5 seconds.  Keep your opposite leg bent to relieve pressure from your back.   __8_ reps per set, _2__ sets per day, _5-7__ days per week.  Copyright  VHI. All rights reserved.   Abduction    Lift leg up toward ceiling. Return. You do not need a weight on your ankle.  Focus on keeping movement really controlled (don't let your leg "jump') Repeat __10__ times each leg. Do _2___ sessions per day.  http://gt2.exer.us/385   Copyright  VHI. All rights reserved.   "I love a Parade" Lift    Using a chair if necessary, march in place and bring your knees as high as you can.  Repeat_20 times on each side.  Do 2 sessions per day.  Use your walker for balance.  Stand tall!! http://gt2.exer.us/344   Copyright  VHI. All rights reserved.

## 2015-08-08 ENCOUNTER — Encounter: Payer: Medicare Other | Admitting: Occupational Therapy

## 2015-08-08 ENCOUNTER — Ambulatory Visit: Payer: Medicare Other | Admitting: Rehabilitation

## 2015-08-08 ENCOUNTER — Encounter: Payer: Self-pay | Admitting: Rehabilitation

## 2015-08-08 DIAGNOSIS — R2681 Unsteadiness on feet: Secondary | ICD-10-CM

## 2015-08-08 DIAGNOSIS — M6281 Muscle weakness (generalized): Secondary | ICD-10-CM

## 2015-08-08 DIAGNOSIS — R2689 Other abnormalities of gait and mobility: Secondary | ICD-10-CM

## 2015-08-08 NOTE — Therapy (Signed)
[facility] [address] [place], [place], [address] Phone: [phone]   Fax:  [phone]  Physical Therapy Treatment  Patient Details  Name: Andrew [name] MRN: [id number] Date of Birth: [birth year] Referring Provider: Alysia Penna, MD  Encounter Date: 08/08/2015      PT End of Session - 08/08/15 1323    Visit Number 9   Number of Visits 17   Date for PT Re-Evaluation 09/06/15   Authorization Type UHC MCR-G Code every 10th visit   PT Start Time 1316   PT Stop Time 1400   PT Time Calculation (min) 44 min   Equipment Utilized During Treatment Gait belt   Activity Tolerance Patient tolerated treatment well   Behavior During Therapy [facility] for tasks assessed/performed      Past Medical History  Diagnosis Date  . HYPERCHOLESTEROLEMIA   . CAD, ARTERY BYPASS GRAFT   . CAROTID ARTERY STENOSIS 03/07/2010    80%  . AORTIC STENOSIS   . GASTROESOPHAGEAL REFLUX DISEASE   . Skin cancer   . History of colonic polyps     1999, 2004  . Allergic rhinitis   . Thyroid nodule 05/2010    Abnormal biopsy, 82 year old patient and his wife have made informed decision to not pursue surgical resection. Potential risk including cancer has been thoroughly discussed with the patient.  . S/P excision of acoustic neuroma   . History of prostate cancer 2002    s/p treatment with seeds / radiation  . Lumbar spinal stenosis 07/13/2009  . COPD, mild ([place]) 12/22/2010  . Pancreatic cyst 01/29/2013    Noted on MRI scan from [facility] on 07/27/2011.  I reviewed report at patient request on 01-29-2013.  "Small cystic focus in the posterior pancreatic head.  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  Follow up MRI is advised. The main pancreatic duct remains normal in appearance."  Patient do  . CHF (congestive heart failure) ([place])   . Macular  degeneration (senile) of retina 04/29/2014    Past Surgical History  Procedure Laterality Date  . Coronary artery bypass graft    . Transperineal implatation of palladium    . Cholecystectomy    . Shoulder surgery    . Total knee arthroplasty    . Cataract extraction    . Tonsillectomy and adenoidectomy  1932  . Craniectomy for excision of acoustic neuroma  1985  . Cataract extraction  2010  . Eye surgery      There were no vitals filed for this visit.      Subjective Assessment - 08/08/15 1322    Subjective "I walked a little bit this morning without the cane, but I didn't mean to."    Patient is accompained by: Family member   Limitations Walking;House hold activities   Patient Stated Goals "I want to get rid of the walker and get back to the cane."    Currently in Pain? No/denies                         [facility] Adult PT Treatment/Exercise - 08/08/15 1330    Berg Balance Test   Sit to Stand Able to stand without using hands and stabilize independently   Standing Unsupported Able to stand 2 minutes with supervision   Sitting with Back Unsupported but Feet Supported on Floor or Stool Able to  sit safely and securely 2 minutes   Stand to Sit Controls descent by using hands   Transfers Able to transfer safely, definite need of hands   Standing Unsupported with Eyes Closed Able to stand 10 seconds with supervision   Standing Ubsupported with Feet Together Able to place feet together independently but unable to hold for 30 seconds   From Standing, Reach Forward with Outstretched Arm Can reach forward >12 cm safely (5")   From Standing Position, Pick up Object from Floor Able to pick up shoe, needs supervision   From Standing Position, Turn to Look Behind Over each Shoulder Needs supervision when turning   Turn 360 Degrees Needs assistance while turning   Standing Unsupported, Alternately Place Feet on Step/Stool Able to complete >2 steps/needs minimal assist    Standing Unsupported, One Foot in Front Able to plae foot ahead of the other independently and hold 30 seconds   Standing on One Leg Tries to lift leg/unable to hold 3 seconds but remains standing independently   Total Score 34      NMR:  Performed BERG balance test with score of 34/56, still placing pt at fall risk, but has increased score from 25 on day of evaluation, indicative of marked decrease in fall risk.  Educated pt and daughter on results.    [place]:  Questioned pt on CVA warning signs/risk factors during session to address STG.  Pt did very well naming many signs/symptoms and risk factors.  Provided min cues as needed.    TA:  Addressed gait speed with RW and SPC. Note that gait speed is 1.86 ft/sec regardless of AD.  Note that gait with SPC did seem improved today, esp when making turns, however continue to feel that RW is safest for community use at this time.            PT Education - 08/08/15 1322    Education provided Yes   Education Details Education to ambulate with cane and/or RW at home   Person(s) Educated Patient   Methods Explanation   Comprehension Verbalized understanding          PT Short Term Goals - 08/08/15 1323    PT SHORT TERM GOAL #1   Title Pt will initiate HEP in order to improve functional mobility and decrease fall risk.  (Target Date: 08/05/15)   Baseline states doing multiple times per day   Status Achieved   PT SHORT TERM GOAL #2   Title Pt will improve BERG balance test to 32/56 in order to indicate decreased fall risk.     Baseline 34/56 on 08/08/15   Status Achieved   PT SHORT TERM GOAL #3   Title Pt will improve gait speed to 1.55 ft/sec in order to indicate decreased fall risk and improved efficiency of gait.     Baseline 1.86 ft/sec w/ RW and 1.86 ft/sec with SPC   Status Achieved   PT SHORT TERM GOAL #4   Title Pt will verbalize understanding of CVA warning signs and risk factors in order to decrease time to seeking medical attention  in case of future stroke.     Baseline met 08/08/15   Status Achieved   PT SHORT TERM GOAL #5   Title Pt will ambulate 200' w/ LRAD without foot up brace at mod I level in order to indicate improved strength and attention to LLE when walking over indoor surfaces.    Baseline ambulates at mod I level with RW without brace  Status Achieved           PT Long Term Goals - 07/09/15 6759    PT LONG TERM GOAL #1   Title Pt will be independent with HEP in order to indicate improved functional mobility and decreased fall risk.  (Target Date: 09/02/15)   PT LONG TERM GOAL #2   Title Pt will improve BERG balance score to >36/56 in order to indicate decreased fall risk.     Status New   PT LONG TERM GOAL #3   Title Pt will improve gait speed to 2.15 ft/sec in order to indicate decreased fall risk and improved efficiency of gait, as well as limited community ambulator.    PT LONG TERM GOAL #4   Title Pt will ambulate over indoor surfaces w/ SPC without foot up brace at mod I level in order to indicate increased independence with household ambulation.     PT LONG TERM GOAL #5   Title Pt will ambulate over paved outdoor surfaces (including ramp and curb) w/ SPC without foot up brace at mod I level in order to indicate increased independence in community.                 Plan - 08/08/15 1323    Clinical Impression Statement Skilled session focused on assessing STGs.  Pt has met 5/5 STGs and is making good progress towards LTGs.  See goals below for details.     Rehab Potential Good   Clinical Impairments Affecting Rehab Potential hx of macular degeneration and acoustic neuroma   PT Frequency 2x / week   PT Duration 8 weeks   PT Treatment/Interventions ADLs/Self Care Home Management;Electrical Stimulation;DME Instruction;Gait training;Stair training;Functional mobility training;Therapeutic activities;Therapeutic exercise;Balance training;Neuromuscular re-education;Patient/family  education;Orthotic Fit/Training;Energy conservation;Vestibular;Visual/perceptual remediation/compensation   PT Next Visit Plan GCODE/PN!!!  gait w/ SPC-obstacles and carrying objects, high level balance activities-SLS, functional hip strength   Consulted and Agree with Plan of Care Patient;Family member/caregiver   Family Member Consulted Rod Holler      Patient will benefit from skilled therapeutic intervention in order to improve the following deficits and impairments:  Abnormal gait, Decreased activity tolerance, Decreased balance, Decreased coordination, Decreased endurance, Decreased mobility, Decreased strength, Difficulty walking, Dizziness, Impaired perceived functional ability, Impaired UE functional use, Impaired vision/preception, Improper body mechanics, Postural dysfunction  Visit Diagnosis: Unsteadiness on feet  Other abnormalities of gait and mobility  Muscle weakness (generalized)     Problem List Patient Active Problem List   Diagnosis Date Noted  . Essential hypertension   . Alcohol abuse   . Thrombocytopenia ([place])   . Right-sided cerebrovascular accident (CVA) ([place])   . Chronic renal insufficiency, stage III (moderate) 06/02/2014  . Macular degeneration (senile) of retina 04/29/2014  . 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 Major depressive disorder, single episode 06/11/2013  . TIA (transient ischemic attack) 06/03/2013  . Pancreatic cyst 01/29/2013  . OSA (obstructive sleep apnea) 12/26/2012  . Chronic diastolic CHF (congestive heart failure) ([place]) 08/19/2012  . COPD, minimal-mild 12/22/2010  . S/P excision of acoustic neuroma 06/18/2010  . Pulmonary nodule 06/17/2010  . Vertigo 06/14/2010  . THYROID NODULE 03/13/2010  . PVD- moderate carotid disease 03/07/2010  . Spinal stenosis, lumbar region, with neurogenic claudication 07/13/2009  . ALLERGIC RHINITIS 09/01/2008  . COLONIC POLYPS, HX OF 09/01/2008  . HYPERCHOLESTEROLEMIA 06/18/2008  . Angina, class II ([place])  06/18/2008  . Hx of CABG 06/18/2008  . Moderate aortic stenosis 06/18/2008  . GASTROESOPHAGEAL REFLUX DISEASE 06/18/2008    Cameron Sprang,  PT, MPT [facility] [address] [place], [place], [address] Phone: [phone]   Fax:  [phone] 08/08/2015, 5:09 PM  Name: Andrew [name] MRN: [id number] Date of Birth: 1923/12/10

## 2015-08-11 ENCOUNTER — Encounter: Payer: Self-pay | Admitting: Rehabilitation

## 2015-08-11 ENCOUNTER — Encounter: Payer: Medicare Other | Admitting: Occupational Therapy

## 2015-08-11 ENCOUNTER — Ambulatory Visit: Payer: Medicare Other | Admitting: Rehabilitation

## 2015-08-11 DIAGNOSIS — R2689 Other abnormalities of gait and mobility: Secondary | ICD-10-CM

## 2015-08-11 DIAGNOSIS — R2681 Unsteadiness on feet: Secondary | ICD-10-CM | POA: Diagnosis not present

## 2015-08-11 DIAGNOSIS — M6281 Muscle weakness (generalized): Secondary | ICD-10-CM

## 2015-08-11 NOTE — Patient Instructions (Signed)
Balance HEP issued, refer to below for details: In corner with chair in front of you for safety:    Feet Together, Varied Arm Positions - Eyes Closed    Stand with feet together and arms as needed for balance/at sides. Close eyes and visualize upright position. Hold __15_ seconds. Repeat __3__ times per session. Do __1-2__ sessions per day.  Copyright  VHI. All rights reserved.    Feet Together, Head Motion - Eyes Open    With eyes open, feet together, move head slowly:  1. Up and down. 2. Left and right 3. Diagonal both ways Repeat __10__ times each one. Do _1-2_ sessions per day.  Copyright  VHI. All rights reserved.    At countertop/sturdy table for balance support:    Single Leg - Eyes Open    Holding support, lift right leg while maintaining balance over other leg. Progress to removing hands from support surface for longer periods of time. Hold__10-15__ seconds. Repeat with standing on right leg and lifting up left leg. Repeat _3_ times with each leg. Do _1-2_ sessions per day.  Copyright  VHI. All rights reserved.   Tandem Stance    Holding onto counter top with both hands:  Right foot in front of left, in a straight line. Balance in this position _10-15__ seconds. Then stand with left foot in front of right foot and hold this for 10-15 seconds. Perform 3 reps with each foot in the front. 1-2 times a day.

## 2015-08-12 DIAGNOSIS — R2681 Unsteadiness on feet: Secondary | ICD-10-CM | POA: Diagnosis not present

## 2015-08-12 NOTE — Therapy (Signed)
[facility] [address] [place], [place], [address] Phone: [phone]   Fax:  [phone]  Physical Therapy Treatment  Patient Details  Name: Andrew [name] MRN: [id number] Date of Birth: 1923-03-10 Referring Provider: Alysia Penna, MD  Encounter Date: 08/11/2015      PT End of Session - 08/12/15 0751    Visit Number 10   Number of Visits 17   Date for PT Re-Evaluation 09/06/15   Authorization Type U[employer] MCR-G Code every 10th visit   PT Start Time 6314   PT Stop Time 1531   PT Time Calculation (min) 46 min   Equipment Utilized During Treatment Gait belt   Activity Tolerance Patient tolerated treatment well   Behavior During Therapy [facility] for tasks assessed/performed      Past Medical History  Diagnosis Date  . HYPERCHOLESTEROLEMIA   . CAD, ARTERY BYPASS GRAFT   . CAROTID ARTERY STENOSIS 03/07/2010    80%  . AORTIC STENOSIS   . GASTROESOPHAGEAL REFLUX DISEASE   . Skin cancer   . History of colonic polyps     1999, 2004  . Allergic rhinitis   . Thyroid nodule 05/2010    Abnormal biopsy, 82 year old patient and his wife have made informed decision to not pursue surgical resection. Potential risk including cancer has been thoroughly discussed with the patient.  . S/P excision of acoustic neuroma   . History of prostate cancer 2002    s/p treatment with seeds / radiation  . Lumbar spinal stenosis 07/13/2009  . COPD, mild ([place]) 12/22/2010  . Pancreatic cyst 01/29/2013    Noted on MRI scan from [facility] on 07/27/2011.  I reviewed report at patient request on 01-29-2013.  "Small cystic focus in the posterior pancreatic head.  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  Follow up MRI is advised. The main pancreatic duct remains normal in appearance."  Patient do  . CHF (congestive heart failure) ([place])   . Macular  degeneration (senile) of retina 04/29/2014    Past Surgical History  Procedure Laterality Date  . Coronary artery bypass graft    . Transperineal implatation of palladium    . Cholecystectomy    . Shoulder surgery    . Total knee arthroplasty    . Cataract extraction    . Tonsillectomy and adenoidectomy  1932  . Craniectomy for excision of acoustic neuroma  1985  . Cataract extraction  2010  . Eye surgery      There were no vitals filed for this visit.      Subjective Assessment - 08/11/15 1452    Subjective "I'm not really doing the balance exercises, I'm doing the leg exercises."    Limitations Walking;House hold activities   Patient Stated Goals "I want to get rid of the walker and get back to the cane."    Currently in Pain? No/denies            NMR:  Went over current HEP for corner and countertop balance activities.  See pt instruction for details on exercises and reps performed.  Educated to decrease speed of head motion to improve safety, but still maintain challenge.  Pt and wife verbalized understanding.  Performed several balance activities on red therapy mat; marching forwards and backwards x 2 reps and walking tandem forwards only x 2 reps (down and back).  Note during marching task especially,  pt with decreased weight shift to the R when elevating LLE.  Provided cues and demonstration for improved weight shift during task as well as manual facilitation.  Performed curb step with SPC during session x 1 rep at min A level.  Continues to require cues for stepping sequence and cane placement and min A to prevent LOB.                       PT Education - 08/11/15 1454    Education provided Yes   Education Details education to decrease amount of time on exercise bike at home to increase compliance with corner balance exercises.     Person(s) Educated Patient   Methods Explanation   Comprehension Verbalized understanding          PT Short Term  Goals - 08/08/15 1323    PT SHORT TERM GOAL #1   Title Pt will initiate HEP in order to improve functional mobility and decrease fall risk.  (Target Date: 08/05/15)   Baseline states doing multiple times per day   Status Achieved   PT SHORT TERM GOAL #2   Title Pt will improve BERG balance test to 32/56 in order to indicate decreased fall risk.     Baseline 34/56 on 08/08/15   Status Achieved   PT SHORT TERM GOAL #3   Title Pt will improve gait speed to 1.55 ft/sec in order to indicate decreased fall risk and improved efficiency of gait.     Baseline 1.86 ft/sec w/ RW and 1.86 ft/sec with SPC   Status Achieved   PT SHORT TERM GOAL #4   Title Pt will verbalize understanding of CVA warning signs and risk factors in order to decrease time to seeking medical attention in case of future stroke.     Baseline met 08/08/15   Status Achieved   PT SHORT TERM GOAL #5   Title Pt will ambulate 200' w/ LRAD without foot up brace at mod I level in order to indicate improved strength and attention to LLE when walking over indoor surfaces.    Baseline ambulates at mod I level with RW without brace   Status Achieved           PT Long Term Goals - 07/09/15 4492    PT LONG TERM GOAL #1   Title Pt will be independent with HEP in order to indicate improved functional mobility and decreased fall risk.  (Target Date: 09/02/15)   PT LONG TERM GOAL #2   Title Pt will improve BERG balance score to >36/56 in order to indicate decreased fall risk.     Status New   PT LONG TERM GOAL #3   Title Pt will improve gait speed to 2.15 ft/sec in order to indicate decreased fall risk and improved efficiency of gait, as well as limited community ambulator.    PT LONG TERM GOAL #4   Title Pt will ambulate over indoor surfaces w/ SPC without foot up brace at mod I level in order to indicate increased independence with household ambulation.     PT LONG TERM GOAL #5   Title Pt will ambulate over paved outdoor surfaces  (including ramp and curb) w/ SPC without foot up brace at mod I level in order to indicate increased independence in community.                 Plan - 08/12/15 0752    Clinical Impression Statement Skilled session focused on  HEP for corner balance tasks as well as tandem walking along counter top.  Pt non-compliant at home with these exercises.  Discussed importance of balance exercises to achieve goals of ambulation with SPC.  Recommended he decrease amount of time on exercise bike to improve compliance with balance tasks.  Also continue to work on high level balance during session on compliant mat, narrowed BOS and modified SLS.     Rehab Potential Good   Clinical Impairments Affecting Rehab Potential hx of macular degeneration and acoustic neuroma   PT Frequency 2x / week   PT Duration 8 weeks   PT Treatment/Interventions ADLs/Self Care Home Management;Electrical Stimulation;DME Instruction;Gait training;Stair training;Functional mobility training;Therapeutic activities;Therapeutic exercise;Balance training;Neuromuscular re-education;Patient/family education;Orthotic Fit/Training;Energy conservation;Vestibular;Visual/perceptual remediation/compensation   PT Next Visit Plan  gait w/ SPC-obstacles and carrying objects, high level balance activities-SLS, functional hip strength   Consulted and Agree with Plan of Care Patient;Family member/caregiver   Family Member Consulted Rod Holler      Patient will benefit from skilled therapeutic intervention in order to improve the following deficits and impairments:  Abnormal gait, Decreased activity tolerance, Decreased balance, Decreased coordination, Decreased endurance, Decreased mobility, Decreased strength, Difficulty walking, Dizziness, Impaired perceived functional ability, Impaired UE functional use, Impaired vision/preception, Improper body mechanics, Postural dysfunction  Visit Diagnosis: Unsteadiness on feet  Other abnormalities of gait and  mobility  Muscle weakness (generalized)       G-Codes - 08-25-15 0755    Functional Assessment Tool Used BERG: 34/56   Functional Limitation Mobility: Walking and moving around   Mobility: Walking and Moving Around Current Status [phone]) At least 20 percent but less than 40 percent impaired, limited or restricted   Mobility: Walking and Moving Around Goal Status [phone]) At least 20 percent but less than 40 percent impaired, limited or restricted      Physical Therapy Progress Note  Dates of Reporting Period: 07/09/15 to 08/11/15  Objective Reports of Subjective Statement: See above  Objective Measurements: BERG 34/56  Goal Update: See LTG's above  Plan: Continue current POC.   Reason Skilled Services are Required: Pt continues to demonstrate decreased balance, decreased balance reactions/strategies, and decreased strength.  Also demonstrates poor attention, esp in busy areas.       Problem List Patient Active Problem List   Diagnosis Date Noted  . Essential hypertension   . Alcohol abuse   . Thrombocytopenia ([place])   . Right-sided cerebrovascular accident (CVA) ([place])   . Chronic renal insufficiency, stage III (moderate) 06/02/2014  . Macular degeneration (senile) of retina 04/29/2014  . 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 Major depressive disorder, single episode 06/11/2013  . TIA (transient ischemic attack) 06/03/2013  . Pancreatic cyst 01/29/2013  . OSA (obstructive sleep apnea) 12/26/2012  . Chronic diastolic CHF (congestive heart failure) ([place]) 08/19/2012  . COPD, minimal-mild 12/22/2010  . S/P excision of acoustic neuroma 06/18/2010  . Pulmonary nodule 06/17/2010  . Vertigo 06/14/2010  . THYROID NODULE 03/13/2010  . PVD- moderate carotid disease 03/07/2010  . Spinal stenosis, lumbar region, with neurogenic claudication 07/13/2009  . ALLERGIC RHINITIS 09/01/2008  . COLONIC POLYPS, HX OF 09/01/2008  . HYPERCHOLESTEROLEMIA 06/18/2008  . Angina, class II ([place])  06/18/2008  . Hx of CABG 06/18/2008  . Moderate aortic stenosis 06/18/2008  . GASTROESOPHAGEAL REFLUX DISEASE 06/18/2008    Cameron Sprang, PT, MPT [facility] [address] [place], [place], [address] Phone: [phone]   Fax:  [phone] 2015/08/25, 7:56 AM  Name: Andrew [name] MRN: [id number] Date  of Birth: 01/04/1924

## 2015-08-15 ENCOUNTER — Ambulatory Visit: Payer: Medicare Other | Admitting: Physical Therapy

## 2015-08-15 ENCOUNTER — Encounter: Payer: Medicare Other | Admitting: Occupational Therapy

## 2015-08-15 ENCOUNTER — Encounter: Payer: Self-pay | Admitting: Physical Therapy

## 2015-08-15 DIAGNOSIS — R2689 Other abnormalities of gait and mobility: Secondary | ICD-10-CM

## 2015-08-15 DIAGNOSIS — R278 Other lack of coordination: Secondary | ICD-10-CM

## 2015-08-15 DIAGNOSIS — R2681 Unsteadiness on feet: Secondary | ICD-10-CM

## 2015-08-15 DIAGNOSIS — M6281 Muscle weakness (generalized): Secondary | ICD-10-CM

## 2015-08-15 NOTE — Therapy (Deleted)
[facility] [address] [place], [place], [address] Phone: [phone]   Fax:  [phone]  Physical Therapy Treatment  Patient Details  Name: Andrew [name] MRN: [id number] Date of Birth: 1923/07/12 Referring Provider: Alysia Penna, MD  Encounter Date: 08/15/2015      PT End of Session - 08/15/15 1549    Visit Number 11   Number of Visits 17   Date for PT Re-Evaluation 09/06/15   Authorization Type UHC MCR-G Code every 10th visit   PT Start Time 0626   PT Stop Time 1531   PT Time Calculation (min) 44 min   Equipment Utilized During Treatment Gait belt   Activity Tolerance Patient tolerated treatment well   Behavior During Therapy [facility] for tasks assessed/performed      Past Medical History:  Diagnosis Date  . Allergic rhinitis   . AORTIC STENOSIS   . CAD, ARTERY BYPASS GRAFT   . CAROTID ARTERY STENOSIS 03/07/2010   80%  . CHF (congestive heart failure) ([place])   . COPD, mild ([place]) 12/22/2010  . GASTROESOPHAGEAL REFLUX DISEASE   . History of colonic polyps    1999, 2004  . History of prostate cancer 2002   s/p treatment with seeds / radiation  . HYPERCHOLESTEROLEMIA   . Lumbar spinal stenosis 07/13/2009  . Macular degeneration (senile) of retina 04/29/2014  . Pancreatic cyst 01/29/2013   Noted on MRI scan from [facility] on 07/27/2011.  I reviewed report at patient request on 01-29-2013.  "Small cystic focus in the posterior pancreatic head.  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  Follow up MRI is advised. The main pancreatic duct remains normal in appearance."  Patient do  . S/P excision of acoustic neuroma   . Skin cancer   . Thyroid nodule 05/2010   Abnormal biopsy, 82 year old patient and his wife have made informed decision to not pursue surgical resection. Potential risk including cancer has been  thoroughly discussed with the patient.    Past Surgical History:  Procedure Laterality Date  . CATARACT EXTRACTION    . CATARACT EXTRACTION  2010  . CHOLECYSTECTOMY    . CORONARY ARTERY BYPASS GRAFT    . CRANIECTOMY FOR EXCISION OF ACOUSTIC NEUROMA  1985  . EYE SURGERY    . SHOULDER SURGERY    . TONSILLECTOMY AND ADENOIDECTOMY  1932  . TOTAL KNEE ARTHROPLASTY    . transperineal implatation of palladium      There were no vitals filed for this visit.      Subjective Assessment - 08/15/15 1450    Subjective No falls. No pain. He is doing his balance exercises about every other day.   Patient is accompained by: Family member   Limitations Walking;House hold activities   Patient Stated Goals "I want to get rid of the walker and get back to the cane."    Currently in Pain? No/denies   Pain Score 0-No pain                         [facility] Adult PT Treatment/Exercise - 08/15/15 1501      Ambulation/Gait   Ambulation/Gait Yes   Ambulation/Gait Assistance 4: Min assist;5: Supervision;4: Min guard  S floor w RW; Min guard floor w SPC; Min assist outside [facility]   Ambulation/Gait Assistance Details Cues needed to maintain upright posture with SPC.  One episode of L foot catching on floor, but pt self-corrected with min assist to maintain balance.   Ambulation Distance (Feet) 230 Feet  x2 reps (SPC); 75' x2 ([place] sidewalk); 115 x1 (RW)   Assistive device Straight cane;Rolling walker   Gait Pattern Step-through pattern;Decreased stride length;Decreased stance time - left;Decreased weight shift to left;Trunk flexed;Narrow base of support;Poor foot clearance - left  occasional toe scuffing with left side   Ramp 6: Modified independent (Device);4: Min assist  Sidewalk ramps up/down x2 w/ SPC   Ramp Details (indicate cue type and reason) Cues to alert pt where sidewalk changes/slopes   Curb 6: Modified independent (Device/increase time);3: Mod assist  w/ SPC   Curb Details  (indicate cue type and reason) Cues for which foot to lead with up/down curb and for cane placement  Sidewalk curb outdoors up/down x3     Neuro Re-ed    Neuro Re-ed Details  Single leg stance activities using tall cones: alternating fwd toe taps and cross toe taps with min to mod assist for balance. cues on posture, wide base of support and weight shifting to assist with balance and control.                   PT Short Term Goals - 08/08/15 1323      PT SHORT TERM GOAL #1   Title Pt will initiate HEP in order to improve functional mobility and decrease fall risk.  (Target Date: 08/05/15)   Baseline states doing multiple times per day   Status Achieved     PT SHORT TERM GOAL #2   Title Pt will improve BERG balance test to 32/56 in order to indicate decreased fall risk.     Baseline 34/56 on 08/08/15   Status Achieved     PT SHORT TERM GOAL #3   Title Pt will improve gait speed to 1.55 ft/sec in order to indicate decreased fall risk and improved efficiency of gait.     Baseline 1.86 ft/sec w/ RW and 1.86 ft/sec with SPC   Status Achieved     PT SHORT TERM GOAL #4   Title Pt will verbalize understanding of CVA warning signs and risk factors in order to decrease time to seeking medical attention in case of future stroke.     Baseline met 08/08/15   Status Achieved     PT SHORT TERM GOAL #5   Title Pt will ambulate 200' w/ LRAD without foot up brace at mod I level in order to indicate improved strength and attention to LLE when walking over indoor surfaces.    Baseline ambulates at mod I level with RW without brace   Status Achieved           PT Long Term Goals - 07/09/15 3299      PT LONG TERM GOAL #1   Title Pt will be independent with HEP in order to indicate improved functional mobility and decreased fall risk.  (Target Date: 09/02/15)     PT LONG TERM GOAL #2   Title Pt will improve BERG balance score to >36/56 in order to indicate decreased fall risk.     Status  New     PT LONG TERM GOAL #3   Title Pt will improve gait speed to 2.15 ft/sec in order to indicate decreased fall risk and improved efficiency of gait, as well as limited community ambulator.      PT LONG TERM GOAL #4   Title Pt will  ambulate over indoor surfaces w/ SPC without foot up brace at mod I level in order to indicate increased independence with household ambulation.       PT LONG TERM GOAL #5   Title Pt will ambulate over paved outdoor surfaces (including ramp and curb) w/ SPC without foot up brace at mod I level in order to indicate increased independence in community.                 Plan - 08/15/15 1550    Clinical Impression Statement Skilled session focused on gait with SPC on solid indoor/outdoor surfaces, including negotiating sidewalk ramps/curbs with SPC. Patient requires min guard with SPC indoors on level floor, but min assist outdoors on sidewalk. Patient is not safe outside with [facility], although he insists on staying with a SPC instead of RW. Remainder of session consisted of higher level balance tasks on compliant surfaces. He has made steady progress toward all goals and should benefit from continued skilled PT.   Rehab Potential Good   Clinical Impairments Affecting Rehab Potential hx of macular degeneration and acoustic neuroma   PT Frequency 2x / week   PT Duration 8 weeks   PT Treatment/Interventions ADLs/Self Care Home Management;Electrical Stimulation;DME Instruction;Gait training;Stair training;Functional mobility training;Therapeutic activities;Therapeutic exercise;Balance training;Neuromuscular re-education;Patient/family education;Orthotic Fit/Training;Energy conservation;Vestibular;Visual/perceptual remediation/compensation   PT Next Visit Plan  gait w/ SPC-obstacles and carrying objects, high level balance activities-SLS, functional hip strength   Consulted and Agree with Plan of Care Patient;Family member/caregiver   Family Member Consulted Rod Holler       Patient will benefit from skilled therapeutic intervention in order to improve the following deficits and impairments:  Abnormal gait, Decreased activity tolerance, Decreased balance, Decreased coordination, Decreased endurance, Decreased mobility, Decreased strength, Difficulty walking, Dizziness, Impaired perceived functional ability, Impaired UE functional use, Impaired vision/preception, Improper body mechanics, Postural dysfunction  Visit Diagnosis: Unsteadiness on feet  Other abnormalities of gait and mobility  Muscle weakness (generalized)  Other lack of coordination     Problem List Patient Active Problem List   Diagnosis Date Noted  . Essential hypertension   . Alcohol abuse   . Thrombocytopenia ([place])   . Right-sided cerebrovascular accident (CVA) ([place])   . Chronic renal insufficiency, stage III (moderate) 06/02/2014  . Macular degeneration (senile) of retina 04/29/2014  . 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 Major depressive disorder, single episode 06/11/2013  . TIA (transient ischemic attack) 06/03/2013  . Pancreatic cyst 01/29/2013  . OSA (obstructive sleep apnea) 12/26/2012  . Chronic diastolic CHF (congestive heart failure) ([place]) 08/19/2012  . COPD, minimal-mild 12/22/2010  . S/P excision of acoustic neuroma 06/18/2010  . Pulmonary nodule 06/17/2010  . Vertigo 06/14/2010  . THYROID NODULE 03/13/2010  . PVD- moderate carotid disease 03/07/2010  . Spinal stenosis, lumbar region, with neurogenic claudication 07/13/2009  . ALLERGIC RHINITIS 09/01/2008  . COLONIC POLYPS, HX OF 09/01/2008  . HYPERCHOLESTEROLEMIA 06/18/2008  . Angina, class II ([place]) 06/18/2008  . Hx of CABG 06/18/2008  . Moderate aortic stenosis 06/18/2008  . GASTROESOPHAGEAL REFLUX DISEASE 06/18/2008    Trixie Deis, [place] 08/15/2015, 4:09 PM  [facility] [address] [place], [place], [address] Phone: [phone]   Fax:   [phone]  Name: JUPITER KABIR MRN: [id number] Date of Birth: [birth year]

## 2015-08-15 NOTE — Therapy (Signed)
[facility] [address] [place], [place], [address] Phone: [phone]   Fax:  [phone]  Physical Therapy Treatment  Patient Details  Name: Andrew [name] MRN: [id number] Date of Birth: [birth year] Referring Provider: Alysia Penna, MD  Encounter Date: 08/15/2015      PT End of Session - 08/15/15 1549    Visit Number 11   Number of Visits 17   Date for PT Re-Evaluation 09/06/15   Authorization Type UHC MCR-G Code every 10th visit   PT Start Time 3299   PT Stop Time 1531   PT Time Calculation (min) 44 min   Equipment Utilized During Treatment Gait belt   Activity Tolerance Patient tolerated treatment well   Behavior During Therapy [facility] for tasks assessed/performed      Past Medical History:  Diagnosis Date  . Allergic rhinitis   . AORTIC STENOSIS   . CAD, ARTERY BYPASS GRAFT   . CAROTID ARTERY STENOSIS 03/07/2010   80%  . CHF (congestive heart failure) ([place])   . COPD, mild ([place]) 12/22/2010  . GASTROESOPHAGEAL REFLUX DISEASE   . History of colonic polyps    1999, 2004  . History of prostate cancer 2002   s/p treatment with seeds / radiation  . HYPERCHOLESTEROLEMIA   . Lumbar spinal stenosis 07/13/2009  . Macular degeneration (senile) of retina 04/29/2014  . Pancreatic cyst 01/29/2013   Noted on MRI scan from [facility] on 07/27/2011.  I reviewed report at patient request on 01-29-2013.  "Small cystic focus in the posterior pancreatic head.  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  Follow up MRI is advised. The main pancreatic duct remains normal in appearance."  Patient do  . S/P excision of acoustic neuroma   . Skin cancer   . Thyroid nodule 05/2010   Abnormal biopsy, 82 year old patient and his wife have made informed decision to not pursue surgical resection. Potential risk including cancer has been  thoroughly discussed with the patient.    Past Surgical History:  Procedure Laterality Date  . CATARACT EXTRACTION    . CATARACT EXTRACTION  2010  . CHOLECYSTECTOMY    . CORONARY ARTERY BYPASS GRAFT    . CRANIECTOMY FOR EXCISION OF ACOUSTIC NEUROMA  1985  . EYE SURGERY    . SHOULDER SURGERY    . TONSILLECTOMY AND ADENOIDECTOMY  1932  . TOTAL KNEE ARTHROPLASTY    . transperineal implatation of palladium      There were no vitals filed for this visit.      Subjective Assessment - 08/15/15 1450    Subjective No falls. No pain. He is doing his balance exercises about every other day.   Patient is accompained by: Family member   Limitations Walking;House hold activities   Patient Stated Goals "I want to get rid of the walker and get back to the cane."    Currently in Pain? No/denies   Pain Score 0-No pain            [facility] Adult PT Treatment/Exercise - 08/15/15 1501      Ambulation/Gait   Ambulation/Gait Yes   Ambulation/Gait Assistance 4: Min assist;5: Supervision;4: Min guard  S floor w RW; Min guard floor w SPC; Min assist outside [facility]   Ambulation/Gait Assistance Details Cues needed to maintain upright posture with SPC. One episode of L foot catching on floor, but pt self-corrected with min  assist to maintain balance.   Ambulation Distance (Feet) 230 Feet  x2 reps (SPC); 75' x2 ([place] sidewalk); 115 x1 (RW)   Assistive device Straight cane;Rolling walker   Gait Pattern Step-through pattern;Decreased stride length;Decreased stance time - left;Decreased weight shift to left;Trunk flexed;Narrow base of support;Poor foot clearance - left  occasional toe scuffing with left side   Ramp 4: Min assist  Sidewalk ramps up/down x2 w/ SPC   Ramp Details (indicate cue type and reason) Cues to alert pt where sidewalk changes/slopes   Curb 3: Mod assist  w/ SPC   Curb Details (indicate cue type and reason) Cues for which foot to lead with up/down curb and for cane placement  Sidewalk  curb outdoors up/down x3     Neuro Re-ed    Neuro Re-ed Details  single leg stance activities using tall cones on red mats placed length of counter: Forward stepping over 4 orange hurdles with R toe tap on cones placed to the R of hurdles, then coming back over same 4 hurdles, but with cone toe taps with L foot on single cone placed to left of each hurdle (3 sets down and back length of counter with SPC and use of one hand on counter for safety/support, VC for technique/sequence, min guard for safety); Facing counter, toe tap on single cone with both feet once, then side step to next cone for B cone toe tap once each foot (4 cones for length of counter down and back x1, VC for technique, min guard for safety and both hands on counter for support); SLS on red mat by counter (30 sec x3 on each foot, min guard for safety with one hand on counter for support, VC for technique)            PT Short Term Goals - 08/08/15 1323      PT SHORT TERM GOAL #1   Title Pt will initiate HEP in order to improve functional mobility and decrease fall risk.  (Target Date: 08/05/15)   Baseline states doing multiple times per day   Status Achieved     PT SHORT TERM GOAL #2   Title Pt will improve BERG balance test to 32/56 in order to indicate decreased fall risk.     Baseline 34/56 on 08/08/15   Status Achieved     PT SHORT TERM GOAL #3   Title Pt will improve gait speed to 1.55 ft/sec in order to indicate decreased fall risk and improved efficiency of gait.     Baseline 1.86 ft/sec w/ RW and 1.86 ft/sec with SPC   Status Achieved     PT SHORT TERM GOAL #4   Title Pt will verbalize understanding of CVA warning signs and risk factors in order to decrease time to seeking medical attention in case of future stroke.     Baseline met 08/08/15   Status Achieved     PT SHORT TERM GOAL #5   Title Pt will ambulate 200' w/ LRAD without foot up brace at mod I level in order to indicate improved strength and attention  to LLE when walking over indoor surfaces.    Baseline ambulates at mod I level with RW without brace   Status Achieved           PT Long Term Goals - 07/09/15 1025      PT LONG TERM GOAL #1   Title Pt will be independent with HEP in order to indicate improved functional mobility and decreased  fall risk.  (Target Date: 09/02/15)     PT LONG TERM GOAL #2   Title Pt will improve BERG balance score to >36/56 in order to indicate decreased fall risk.     Status New     PT LONG TERM GOAL #3   Title Pt will improve gait speed to 2.15 ft/sec in order to indicate decreased fall risk and improved efficiency of gait, as well as limited community ambulator.      PT LONG TERM GOAL #4   Title Pt will ambulate over indoor surfaces w/ SPC without foot up brace at mod I level in order to indicate increased independence with household ambulation.       PT LONG TERM GOAL #5   Title Pt will ambulate over paved outdoor surfaces (including ramp and curb) w/ SPC without foot up brace at mod I level in order to indicate increased independence in community.              Plan - 08/15/15 1550    Clinical Impression Statement Skilled session focused on gait with SPC on solid indoor/outdoor surfaces, including negotiating sidewalk ramps/curbs with SPC. Patient requires min guard with SPC indoors on level floor, but min assist outdoors on sidewalk. Patient is not safe outside with [facility], although he insists on staying with a SPC instead of RW. Remainder of session consisted of higher level balance tasks on compliant surfaces. He has made steady progress toward all goals and should benefit from continued skilled PT.   Rehab Potential Good   Clinical Impairments Affecting Rehab Potential hx of macular degeneration and acoustic neuroma   PT Frequency 2x / week   PT Duration 8 weeks   PT Treatment/Interventions ADLs/Self Care Home Management;Electrical Stimulation;DME Instruction;Gait training;Stair  training;Functional mobility training;Therapeutic activities;Therapeutic exercise;Balance training;Neuromuscular re-education;Patient/family education;Orthotic Fit/Training;Energy conservation;Vestibular;Visual/perceptual remediation/compensation   PT Next Visit Plan  gait w/ SPC-obstacles and carrying objects, high level balance activities-SLS, functional hip strength   Consulted and Agree with Plan of Care Patient;Family member/caregiver   Family Member Consulted Rod Holler      Patient will benefit from skilled therapeutic intervention in order to improve the following deficits and impairments:  Abnormal gait, Decreased activity tolerance, Decreased balance, Decreased coordination, Decreased endurance, Decreased mobility, Decreased strength, Difficulty walking, Dizziness, Impaired perceived functional ability, Impaired UE functional use, Impaired vision/preception, Improper body mechanics, Postural dysfunction  Visit Diagnosis: Unsteadiness on feet  Other abnormalities of gait and mobility  Muscle weakness (generalized)  Other lack of coordination     Problem List Patient Active Problem List   Diagnosis Date Noted  . Essential hypertension   . Alcohol abuse   . Thrombocytopenia ([place])   . Right-sided cerebrovascular accident (CVA) ([place])   . Chronic renal insufficiency, stage III (moderate) 06/02/2014  . Macular degeneration (senile) of retina 04/29/2014  . 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 Major depressive disorder, single episode 06/11/2013  . TIA (transient ischemic attack) 06/03/2013  . Pancreatic cyst 01/29/2013  . OSA (obstructive sleep apnea) 12/26/2012  . Chronic diastolic CHF (congestive heart failure) ([place]) 08/19/2012  . COPD, minimal-mild 12/22/2010  . S/P excision of acoustic neuroma 06/18/2010  . Pulmonary nodule 06/17/2010  . Vertigo 06/14/2010  . THYROID NODULE 03/13/2010  . PVD- moderate carotid disease 03/07/2010  . Spinal stenosis, lumbar region, with neurogenic  claudication 07/13/2009  . ALLERGIC RHINITIS 09/01/2008  . COLONIC POLYPS, HX OF 09/01/2008  . HYPERCHOLESTEROLEMIA 06/18/2008  . Angina, class II ([place]) 06/18/2008  . Hx of CABG 06/18/2008  .  Moderate aortic stenosis 06/18/2008  . GASTROESOPHAGEAL REFLUX DISEASE 06/18/2008    Trixie Deis, [place] 08/15/2015, 4:10 PM  [facility] [address] [place], [place], [address] Phone: [phone]   Fax:  [phone]  Name: VELMER WOELFEL MRN: [id number] Date of Birth: 02/25/1923  This note has been reviewed and edited by supervising CI.  Willow Ora, PTA, [facility] [address] [place], [place] [address] [phone] 08/16/15, 9:25 AM

## 2015-08-18 ENCOUNTER — Encounter: Payer: Self-pay | Admitting: Rehabilitation

## 2015-08-18 ENCOUNTER — Encounter: Payer: Medicare Other | Admitting: Occupational Therapy

## 2015-08-18 ENCOUNTER — Ambulatory Visit: Payer: Medicare Other | Admitting: Rehabilitation

## 2015-08-18 DIAGNOSIS — R2689 Other abnormalities of gait and mobility: Secondary | ICD-10-CM

## 2015-08-18 DIAGNOSIS — R2681 Unsteadiness on feet: Secondary | ICD-10-CM

## 2015-08-18 DIAGNOSIS — M6281 Muscle weakness (generalized): Secondary | ICD-10-CM

## 2015-08-18 NOTE — Therapy (Signed)
[facility] [address] [place], [place], [address] Phone: [phone]   Fax:  [phone]  Physical Therapy Treatment  Patient Details  Name: Andrew [name] MRN: [id number] Date of Birth: 1923/07/26 Referring Provider: Alysia Penna, MD  Encounter Date: 08/18/2015      PT End of Session - 08/18/15 1621    Visit Number 12   Number of Visits 17   Date for PT Re-Evaluation 09/06/15   Authorization Type UHC MCR-G Code every 10th visit   PT Start Time 1402   PT Stop Time 1446   PT Time Calculation (min) 44 min   Equipment Utilized During Treatment Gait belt   Activity Tolerance Patient tolerated treatment well   Behavior During Therapy [facility] for tasks assessed/performed      Past Medical History:  Diagnosis Date  . Allergic rhinitis   . AORTIC STENOSIS   . CAD, ARTERY BYPASS GRAFT   . CAROTID ARTERY STENOSIS 03/07/2010   80%  . CHF (congestive heart failure) ([place])   . COPD, mild ([place]) 12/22/2010  . GASTROESOPHAGEAL REFLUX DISEASE   . History of colonic polyps    1999, 2004  . History of prostate cancer 2002   s/p treatment with seeds / radiation  . HYPERCHOLESTEROLEMIA   . Lumbar spinal stenosis 07/13/2009  . Macular degeneration (senile) of retina 04/29/2014  . Pancreatic cyst 01/29/2013   Noted on MRI scan from [facility] on 07/27/2011.  I reviewed report at patient request on 01-29-2013.  "Small cystic focus in the posterior pancreatic head.  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  Follow up MRI is advised. The main pancreatic duct remains normal in appearance."  Patient do  . S/P excision of acoustic neuroma   . Skin cancer   . Thyroid nodule 05/2010   Abnormal biopsy, 82 year old patient and his wife have made informed decision to not pursue surgical resection. Potential risk including cancer has been  thoroughly discussed with the patient.    Past Surgical History:  Procedure Laterality Date  . CATARACT EXTRACTION    . CATARACT EXTRACTION  2010  . CHOLECYSTECTOMY    . CORONARY ARTERY BYPASS GRAFT    . CRANIECTOMY FOR EXCISION OF ACOUSTIC NEUROMA  1985  . EYE SURGERY    . SHOULDER SURGERY    . TONSILLECTOMY AND ADENOIDECTOMY  1932  . TOTAL KNEE ARTHROPLASTY    . transperineal implatation of palladium      There were no vitals filed for this visit.      Subjective Assessment - 08/18/15 1408    Subjective No falls, no changes.  States doing exercises yesterday but not today.    Patient is accompained by: Family member   Limitations Walking;House hold activities   Patient Stated Goals "I want to get rid of the walker and get back to the cane."    Currently in Pain? No/denies          Gait:  Performed gait during session x 115' x 1 rep, x 230' x 1 rep while holding folded laundry (task that pt would complete at home).  Requires S overall, however when negotiating obstacles (around and stepping over) requires more min A, esp when fatigued to maintain balance.  Recommend pt begin carrying simple objects at home with assist from wife as needed to improve functional gait in house.  Continue to educate that pt  needs to ambulate with RW when outdoors and feel that he will likely need to use RW at end of episode of care.  Pt stating "I think I'll be there."  Will continue to address, however recommended that if he is going to attempt to have children available and not wife as to ensure no injury come to one or both of them.  Both verbalized understanding.    NMR:  Addressed gait with SPC with head turns in order to further challenge balance and better simulate community environment and having to scan environment.  Pt performed 50' x 2 while performing varying head turns up/down, side/side and diagonals.  Requires min A to prevent LOB and constant cues for increased step length as when he  begins to lose balance, his steps get very short and shuffled.  Also had pt perform scanning task looking for sticky notes randomly placed in hallway to work on scanning, reaching up and down for note.  Again, requires up to min A and rest break due to fatigue and back pain during task.                         PT Education - 08/18/15 1618    Education provided Yes   Education Details Education on pt using RW at all times in community unless has children with him for min A as PT does not feel he will be able to ambulate with cane in community at time of therapy ending.     Person(s) Educated Patient;Spouse   Methods Explanation   Comprehension Verbalized understanding          PT Short Term Goals - 08/08/15 1323      PT SHORT TERM GOAL #1   Title Pt will initiate HEP in order to improve functional mobility and decrease fall risk.  (Target Date: 08/05/15)   Baseline states doing multiple times per day   Status Achieved     PT SHORT TERM GOAL #2   Title Pt will improve BERG balance test to 32/56 in order to indicate decreased fall risk.     Baseline 34/56 on 08/08/15   Status Achieved     PT SHORT TERM GOAL #3   Title Pt will improve gait speed to 1.55 ft/sec in order to indicate decreased fall risk and improved efficiency of gait.     Baseline 1.86 ft/sec w/ RW and 1.86 ft/sec with SPC   Status Achieved     PT SHORT TERM GOAL #4   Title Pt will verbalize understanding of CVA warning signs and risk factors in order to decrease time to seeking medical attention in case of future stroke.     Baseline met 08/08/15   Status Achieved     PT SHORT TERM GOAL #5   Title Pt will ambulate 200' w/ LRAD without foot up brace at mod I level in order to indicate improved strength and attention to LLE when walking over indoor surfaces.    Baseline ambulates at mod I level with RW without brace   Status Achieved           PT Long Term Goals - 07/09/15 1610      PT LONG  TERM GOAL #1   Title Pt will be independent with HEP in order to indicate improved functional mobility and decreased fall risk.  (Target Date: 09/02/15)     PT LONG TERM GOAL #2   Title Pt will improve BERG balance  score to >36/56 in order to indicate decreased fall risk.     Status New     PT LONG TERM GOAL #3   Title Pt will improve gait speed to 2.15 ft/sec in order to indicate decreased fall risk and improved efficiency of gait, as well as limited community ambulator.      PT LONG TERM GOAL #4   Title Pt will ambulate over indoor surfaces w/ SPC without foot up brace at mod I level in order to indicate increased independence with household ambulation.       PT LONG TERM GOAL #5   Title Pt will ambulate over paved outdoor surfaces (including ramp and curb) w/ SPC without foot up brace at mod I level in order to indicate increased independence in community.                 Plan - 08/18/15 1623    Clinical Impression Statement Skilled session focused on gait with SPC while carrying object in LUE to better simulate functional tasks at home and high level balance tasks to work towards [place].  Pt educated on using SPC indoors and RW outdoors unless children can provide min A.     Rehab Potential Good   Clinical Impairments Affecting Rehab Potential hx of macular degeneration and acoustic neuroma   PT Frequency 2x / week   PT Duration 8 weeks   PT Treatment/Interventions ADLs/Self Care Home Management;Electrical Stimulation;DME Instruction;Gait training;Stair training;Functional mobility training;Therapeutic activities;Therapeutic exercise;Balance training;Neuromuscular re-education;Patient/family education;Orthotic Fit/Training;Energy conservation;Vestibular;Visual/perceptual remediation/compensation   PT Next Visit Plan  gait w/ SPC-obstacles and carrying objects, high level balance activities-SLS, functional hip strength   Consulted and Agree with Plan of Care Patient;Family  member/caregiver   Family Member Consulted Rod Holler      Patient will benefit from skilled therapeutic intervention in order to improve the following deficits and impairments:  Abnormal gait, Decreased activity tolerance, Decreased balance, Decreased coordination, Decreased endurance, Decreased mobility, Decreased strength, Difficulty walking, Dizziness, Impaired perceived functional ability, Impaired UE functional use, Impaired vision/preception, Improper body mechanics, Postural dysfunction  Visit Diagnosis: Unsteadiness on feet  Other abnormalities of gait and mobility  Muscle weakness (generalized)     Problem List Patient Active Problem List   Diagnosis Date Noted  . Essential hypertension   . Alcohol abuse   . Thrombocytopenia ([place])   . Right-sided cerebrovascular accident (CVA) ([place])   . Chronic renal insufficiency, stage III (moderate) 06/02/2014  . Macular degeneration (senile) of retina 04/29/2014  . 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 Major depressive disorder, single episode 06/11/2013  . TIA (transient ischemic attack) 06/03/2013  . Pancreatic cyst 01/29/2013  . OSA (obstructive sleep apnea) 12/26/2012  . Chronic diastolic CHF (congestive heart failure) ([place]) 08/19/2012  . COPD, minimal-mild 12/22/2010  . S/P excision of acoustic neuroma 06/18/2010  . Pulmonary nodule 06/17/2010  . Vertigo 06/14/2010  . THYROID NODULE 03/13/2010  . PVD- moderate carotid disease 03/07/2010  . Spinal stenosis, lumbar region, with neurogenic claudication 07/13/2009  . ALLERGIC RHINITIS 09/01/2008  . COLONIC POLYPS, HX OF 09/01/2008  . HYPERCHOLESTEROLEMIA 06/18/2008  . Angina, class II ([place]) 06/18/2008  . Hx of CABG 06/18/2008  . Moderate aortic stenosis 06/18/2008  . GASTROESOPHAGEAL REFLUX DISEASE 06/18/2008    Cameron Sprang, PT, MPT [facility] [address] [place], [place], [address] Phone: [phone]   Fax:  [phone] 08/18/15,  4:31 PM  Name: Andrew [name] MRN: [id number] Date of Birth: 20-Oct-1923

## 2015-08-22 ENCOUNTER — Encounter (HOSPITAL_COMMUNITY): Payer: Medicare Other

## 2015-08-22 ENCOUNTER — Ambulatory Visit: Payer: Medicare Other | Admitting: Physical Therapy

## 2015-08-22 ENCOUNTER — Encounter: Payer: Self-pay | Admitting: Physical Therapy

## 2015-08-22 ENCOUNTER — Ambulatory Visit: Payer: Medicare Other | Admitting: Family

## 2015-08-22 ENCOUNTER — Telehealth: Payer: Self-pay | Admitting: Cardiology

## 2015-08-22 ENCOUNTER — Encounter: Payer: Medicare Other | Admitting: Occupational Therapy

## 2015-08-22 DIAGNOSIS — M6281 Muscle weakness (generalized): Secondary | ICD-10-CM

## 2015-08-22 DIAGNOSIS — R2689 Other abnormalities of gait and mobility: Secondary | ICD-10-CM

## 2015-08-22 DIAGNOSIS — R2681 Unsteadiness on feet: Secondary | ICD-10-CM

## 2015-08-22 NOTE — Telephone Encounter (Signed)
Pt's wife aware appt has been scheduled with Dr Aundra Dubin 11/04/15 at 2:45PM.

## 2015-08-22 NOTE — Telephone Encounter (Signed)
Pt returning call regarding October appt

## 2015-08-23 NOTE — Therapy (Signed)
[facility] [address] [place], [place], [address] Phone: [phone]   Fax:  [phone]  Physical Therapy Treatment  Patient Details  Name: Andrew [name] MRN: [id number] Date of Birth: 07-12-1923 Referring Provider: Alysia Penna, MD  Encounter Date: 08/22/2015      PT End of Session - 08/22/15 1410    Visit Number 13   Number of Visits 17   Date for PT Re-Evaluation 09/06/15   Authorization Type U[employer] MCR-G Code every 10th visit   PT Start Time 1405   PT Stop Time 1445   PT Time Calculation (min) 40 min   Equipment Utilized During Treatment Gait belt   Activity Tolerance Patient tolerated treatment well   Behavior During Therapy [facility] for tasks assessed/performed      Past Medical History:  Diagnosis Date  . Allergic rhinitis   . AORTIC STENOSIS   . CAD, ARTERY BYPASS GRAFT   . CAROTID ARTERY STENOSIS 03/07/2010   80%  . CHF (congestive heart failure) ([place])   . COPD, mild ([place]) 12/22/2010  . GASTROESOPHAGEAL REFLUX DISEASE   . History of colonic polyps    1999, 2004  . History of prostate cancer 2002   s/p treatment with seeds / radiation  . HYPERCHOLESTEROLEMIA   . Lumbar spinal stenosis 07/13/2009  . Macular degeneration (senile) of retina 04/29/2014  . Pancreatic cyst 01/29/2013   Noted on MRI scan from [facility] on 07/27/2011.  I reviewed report at patient request on 01-29-2013.  "Small cystic focus in the posterior pancreatic head.  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  Follow up MRI is advised. The main pancreatic duct remains normal in appearance."  Patient do  . S/P excision of acoustic neuroma   . Skin cancer   . Thyroid nodule 05/2010   Abnormal biopsy, 82 year old patient and his wife have made informed decision to not pursue surgical resection. Potential risk including cancer has been  thoroughly discussed with the patient.    Past Surgical History:  Procedure Laterality Date  . CATARACT EXTRACTION    . CATARACT EXTRACTION  2010  . CHOLECYSTECTOMY    . CORONARY ARTERY BYPASS GRAFT    . CRANIECTOMY FOR EXCISION OF ACOUSTIC NEUROMA  1985  . EYE SURGERY    . SHOULDER SURGERY    . TONSILLECTOMY AND ADENOIDECTOMY  1932  . TOTAL KNEE ARTHROPLASTY    . transperineal implatation of palladium      There were no vitals filed for this visit.      Subjective Assessment - 08/22/15 1408    Subjective No new complaints. No falls or pain to report.    Patient is accompained by: Family member   Limitations Walking;House hold activities   Patient Stated Goals "I want to get rid of the walker and get back to the cane."    Currently in Pain? No/denies   Pain Score 0-No pain           [facility] Adult PT Treatment/Exercise - 08/22/15 1411      Transfers   Transfers Sit to Stand;Stand to Sit   Sit to Stand 6: Modified independent (Device/Increase time);With upper extremity assist;From bed   Stand to Sit 6: Modified independent (Device/Increase time);With upper extremity assist;To bed     Ambulation/Gait   Ambulation/Gait Yes   Ambulation/Gait Assistance 4: Min guard;4: Min assist   Ambulation/Gait Assistance Details  cues needed for step length and posture. left toe scuffing noted on indoor surfaces at times, increased with fatigue. increased assistance needed with outdoor surfaces with cues on when inclines/declines were coming up, otherwise pt not able to adapt balance. Had pt go and fix cup of water and carry said cup of water from ADL kitchen to mat table with min guard assit to min assist for balance.                                Ambulation Distance (Feet) 230 Feet  x 3 indoors, x1 indoor/outdoors, + laps with head movements   Assistive device Straight cane   Gait Pattern Step-through pattern;Decreased stride length;Decreased stance time - left;Decreased weight shift to  left;Trunk flexed;Narrow base of support;Poor foot clearance - left  occasional left toe scuffing, increased as pt fatigued    Ambulation Surface Level;Indoor;Unlevel;Outdoor;Paved;Gravel     Neuro Re-ed    Neuro Re-ed Details  gait in straight pathway along ~40-45 foot pathway with straight cane with: head movements up<>down and right <>left x 4 laps forward with min guard to min assist for balance.                                                    PT Short Term Goals - 08/08/15 1323      PT SHORT TERM GOAL #1   Title Pt will initiate HEP in order to improve functional mobility and decrease fall risk.  (Target Date: 08/05/15)   Baseline states doing multiple times per day   Status Achieved     PT SHORT TERM GOAL #2   Title Pt will improve BERG balance test to 32/56 in order to indicate decreased fall risk.     Baseline 34/56 on 08/08/15   Status Achieved     PT SHORT TERM GOAL #3   Title Pt will improve gait speed to 1.55 ft/sec in order to indicate decreased fall risk and improved efficiency of gait.     Baseline 1.86 ft/sec w/ RW and 1.86 ft/sec with SPC   Status Achieved     PT SHORT TERM GOAL #4   Title Pt will verbalize understanding of CVA warning signs and risk factors in order to decrease time to seeking medical attention in case of future stroke.     Baseline met 08/08/15   Status Achieved     PT SHORT TERM GOAL #5   Title Pt will ambulate 200' w/ LRAD without foot up brace at mod I level in order to indicate improved strength and attention to LLE when walking over indoor surfaces.    Baseline ambulates at mod I level with RW without brace   Status Achieved           PT Long Term Goals - 07/09/15 7782      PT LONG TERM GOAL #1   Title Pt will be independent with HEP in order to indicate improved functional mobility and decreased fall risk.  (Target Date: 09/02/15)     PT LONG TERM GOAL #2   Title Pt will improve BERG balance score to >36/56 in order to  indicate decreased fall risk.     Status New     PT LONG TERM GOAL #3   Title Pt  will improve gait speed to 2.15 ft/sec in order to indicate decreased fall risk and improved efficiency of gait, as well as limited community ambulator.      PT LONG TERM GOAL #4   Title Pt will ambulate over indoor surfaces w/ SPC without foot up brace at mod I level in order to indicate increased independence with household ambulation.       PT LONG TERM GOAL #5   Title Pt will ambulate over paved outdoor surfaces (including ramp and curb) w/ SPC without foot up brace at mod I level in order to indicate increased independence in community.              Plan - 08/22/15 1410    Clinical Impression Statement Today's skilled session continued to focus on gait with straight cane on various surfaces, obstacle negotiation, and gait with carrying things. Pt continues to need up to min assist for balance with cane on compliant surfaces and while multi-tasking. Pt is making slow, steady progress toward goals.                                                    Rehab Potential Good   Clinical Impairments Affecting Rehab Potential hx of macular degeneration and acoustic neuroma   PT Frequency 2x / week   PT Duration 8 weeks   PT Treatment/Interventions ADLs/Self Care Home Management;Electrical Stimulation;DME Instruction;Gait training;Stair training;Functional mobility training;Therapeutic activities;Therapeutic exercise;Balance training;Neuromuscular re-education;Patient/family education;Orthotic Fit/Training;Energy conservation;Vestibular;Visual/perceptual remediation/compensation   PT Next Visit Plan  gait w/ SPC-obstacles and carrying objects, high level balance activities-SLS, functional hip strength   Consulted and Agree with Plan of Care Patient;Family member/caregiver   Family Member Consulted Rod Holler      Patient will benefit from skilled therapeutic intervention in order to improve the following deficits and  impairments:  Abnormal gait, Decreased activity tolerance, Decreased balance, Decreased coordination, Decreased endurance, Decreased mobility, Decreased strength, Difficulty walking, Dizziness, Impaired perceived functional ability, Impaired UE functional use, Impaired vision/preception, Improper body mechanics, Postural dysfunction  Visit Diagnosis: Unsteadiness on feet  Other abnormalities of gait and mobility  Muscle weakness (generalized)     Problem List Patient Active Problem List   Diagnosis Date Noted  . Essential hypertension   . Alcohol abuse   . Thrombocytopenia ([place])   . Right-sided cerebrovascular accident (CVA) ([place])   . Chronic renal insufficiency, stage III (moderate) 06/02/2014  . Macular degeneration (senile) of retina 04/29/2014  . 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 Major depressive disorder, single episode 06/11/2013  . TIA (transient ischemic attack) 06/03/2013  . Pancreatic cyst 01/29/2013  . OSA (obstructive sleep apnea) 12/26/2012  . Chronic diastolic CHF (congestive heart failure) ([place]) 08/19/2012  . COPD, minimal-mild 12/22/2010  . S/P excision of acoustic neuroma 06/18/2010  . Pulmonary nodule 06/17/2010  . Vertigo 06/14/2010  . THYROID NODULE 03/13/2010  . PVD- moderate carotid disease 03/07/2010  . Spinal stenosis, lumbar region, with neurogenic claudication 07/13/2009  . ALLERGIC RHINITIS 09/01/2008  . COLONIC POLYPS, HX OF 09/01/2008  . HYPERCHOLESTEROLEMIA 06/18/2008  . Angina, class II ([place]) 06/18/2008  . Hx of CABG 06/18/2008  . Moderate aortic stenosis 06/18/2008  . GASTROESOPHAGEAL REFLUX DISEASE 06/18/2008    Willow Ora, PTA, [facility] [address], [place], [place] [address] [phone] 08/23/15, 7:47 PM   Name: Andrew [name] MRN:  [id number] Date of Birth: 1923-02-17

## 2015-08-25 ENCOUNTER — Ambulatory Visit: Payer: Medicare Other | Attending: Physical Medicine & Rehabilitation | Admitting: Physical Therapy

## 2015-08-25 ENCOUNTER — Encounter: Payer: Medicare Other | Admitting: Occupational Therapy

## 2015-08-25 ENCOUNTER — Encounter: Payer: Self-pay | Admitting: Physical Therapy

## 2015-08-25 DIAGNOSIS — R2689 Other abnormalities of gait and mobility: Secondary | ICD-10-CM | POA: Insufficient documentation

## 2015-08-25 DIAGNOSIS — M6281 Muscle weakness (generalized): Secondary | ICD-10-CM | POA: Insufficient documentation

## 2015-08-25 DIAGNOSIS — R2681 Unsteadiness on feet: Secondary | ICD-10-CM | POA: Insufficient documentation

## 2015-08-25 DIAGNOSIS — R278 Other lack of coordination: Secondary | ICD-10-CM | POA: Insufficient documentation

## 2015-08-26 NOTE — Therapy (Signed)
[facility] [address] [place], [place], [address] Phone: [phone]   Fax:  [phone]  Physical Therapy Treatment  Patient Details  Name: Andrew [name] MRN: [id number] Date of Birth: 1924-01-15 Referring Provider: Alysia Penna, MD  Encounter Date: 08/25/2015      PT End of Session - 08/25/15 1630    Visit Number 14   Number of Visits 17   Date for PT Re-Evaluation 09/06/15   Authorization Type UHC MCR-G Code every 10th visit   PT Start Time 6269   PT Stop Time 1530   PT Time Calculation (min) 45 min   Equipment Utilized During Treatment Gait belt   Activity Tolerance Patient tolerated treatment well   Behavior During Therapy [facility] for tasks assessed/performed      Past Medical History:  Diagnosis Date  . Allergic rhinitis   . AORTIC STENOSIS   . CAD, ARTERY BYPASS GRAFT   . CAROTID ARTERY STENOSIS 03/07/2010   80%  . CHF (congestive heart failure) ([place])   . COPD, mild ([place]) 12/22/2010  . GASTROESOPHAGEAL REFLUX DISEASE   . History of colonic polyps    1999, 2004  . History of prostate cancer 2002   s/p treatment with seeds / radiation  . HYPERCHOLESTEROLEMIA   . Lumbar spinal stenosis 07/13/2009  . Macular degeneration (senile) of retina 04/29/2014  . Pancreatic cyst 01/29/2013   Noted on MRI scan from [facility] on 07/27/2011.  I reviewed report at patient request on 01-29-2013.  "Small cystic focus in the posterior pancreatic head.  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  Follow up MRI is advised. The main pancreatic duct remains normal in appearance."  Patient do  . S/P excision of acoustic neuroma   . Skin cancer   . Thyroid nodule 05/2010   Abnormal biopsy, 82 year old patient and his wife have made informed decision to not pursue surgical resection. Potential risk including cancer has been  thoroughly discussed with the patient.    Past Surgical History:  Procedure Laterality Date  . CATARACT EXTRACTION    . CATARACT EXTRACTION  2010  . CHOLECYSTECTOMY    . CORONARY ARTERY BYPASS GRAFT    . CRANIECTOMY FOR EXCISION OF ACOUSTIC NEUROMA  1985  . EYE SURGERY    . SHOULDER SURGERY    . TONSILLECTOMY AND ADENOIDECTOMY  1932  . TOTAL KNEE ARTHROPLASTY    . transperineal implatation of palladium      There were no vitals filed for this visit.      Subjective Assessment - 08/25/15 1453    Subjective Had a fall on tuesday. Was using the cane walking from living room (got up out of chair) going to bathroom. When going around walker he lost balance and "i fell". Pt unable to recall details other than falling to floor. Denies any injuries or bruises. Pt got himself up and then buzzed his call button for spouse (in other part of house) to let her know about the fall.                                             Patient is accompained by: Family member   Limitations Walking;House hold activities   Patient Stated Goals "I want to get rid of the  walker and get back to the cane."    Currently in Pain? No/denies   Pain Score 0-No pain              [facility] Adult PT Treatment/Exercise - 08/25/15 1508      Transfers   Transfers Sit to Stand;Stand to Sit   Sit to Stand 6: Modified independent (Device/Increase time);With upper extremity assist;From bed   Stand to Sit 6: Modified independent (Device/Increase time);With upper extremity assist;To bed     Ambulation/Gait   Ambulation/Gait Yes   Ambulation/Gait Assistance 4: Min guard;4: Min assist   Ambulation/Gait Assistance Details pt needs cues for increased step length and step height at times with gait. multiple episodes with balance loss needing assistance to correct during gait on both level and unlevel surfaces today.                         Ambulation Distance (Feet) 240 Feet  x2; 250 x1 outdoors;    Assistive device  Straight cane   Gait Pattern Step-through pattern;Decreased stride length;Decreased stance time - left;Decreased weight shift to left;Trunk flexed;Narrow base of support;Poor foot clearance - left   Ambulation Surface Level;Indoor;Unlevel;Outdoor;Paved   Ramp 4: Min assist   Ramp Details (indicate cue type and reason) on outdoor inclines/declines with cane, min assist for balance with cues on step length and foot clearance     Curb 4: Min assist   Curb Details (indicate cue type and reason) x1 with outdoor curb using cane, min assist for balance with cues on posture, sequencing and technique   Gait Comments gait also performed along 50 foot hallway with cane while performing head movements up<>down and left<>right x 4 laps forward each, min assist for balance            PT Education - 08/25/15 1630    Education provided Yes   Education Details Recommend pt use RW when walking alone at this time and cane only when someone is with him (both indoors and outdoors) due to recent fall and instability with session today. pt/spouse verbalized understanding.    Person(s) Educated Patient;Spouse   Methods Explanation   Comprehension Verbalized understanding          PT Short Term Goals - 08/08/15 1323      PT SHORT TERM GOAL #1   Title Pt will initiate HEP in order to improve functional mobility and decrease fall risk.  (Target Date: 08/05/15)   Baseline states doing multiple times per day   Status Achieved     PT SHORT TERM GOAL #2   Title Pt will improve BERG balance test to 32/56 in order to indicate decreased fall risk.     Baseline 34/56 on 08/08/15   Status Achieved     PT SHORT TERM GOAL #3   Title Pt will improve gait speed to 1.55 ft/sec in order to indicate decreased fall risk and improved efficiency of gait.     Baseline 1.86 ft/sec w/ RW and 1.86 ft/sec with SPC   Status Achieved     PT SHORT TERM GOAL #4   Title Pt will verbalize understanding of CVA warning signs and  risk factors in order to decrease time to seeking medical attention in case of future stroke.     Baseline met 08/08/15   Status Achieved     PT SHORT TERM GOAL #5   Title Pt will ambulate 200' w/ LRAD without foot up brace at [facility]  I level in order to indicate improved strength and attention to LLE when walking over indoor surfaces.    Baseline ambulates at mod I level with RW without brace   Status Achieved           PT Long Term Goals - 07/09/15 4010      PT LONG TERM GOAL #1   Title Pt will be independent with HEP in order to indicate improved functional mobility and decreased fall risk.  (Target Date: 09/02/15)     PT LONG TERM GOAL #2   Title Pt will improve BERG balance score to >36/56 in order to indicate decreased fall risk.     Status New     PT LONG TERM GOAL #3   Title Pt will improve gait speed to 2.15 ft/sec in order to indicate decreased fall risk and improved efficiency of gait, as well as limited community ambulator.      PT LONG TERM GOAL #4   Title Pt will ambulate over indoor surfaces w/ SPC without foot up brace at mod I level in order to indicate increased independence with household ambulation.       PT LONG TERM GOAL #5   Title Pt will ambulate over paved outdoor surfaces (including ramp and curb) w/ SPC without foot up brace at mod I level in order to indicate increased independence in community.              Plan - 08/25/15 1501    Clinical Impression Statement Beginning of session address pt's recent fall and safety with gait. Pt still determined to use cane when done with therapy, however has not used it since recent fall. Spouse reports he has been more carefull and hesitant since fall. Due to pt continuing to want to use cane the remainder of today's session continued to address gait with straight cane. Pt needed increased assistance today vs with previous session for balance. Continues to need cues to slow down and for step length/step height at times  due to shuffled gait pattern.  Pt continues to need increased assistance/cues when presented with obstacles, narrowed spaces, crowded areas and complaint surfaces due to resorting to the shuffled gait pattern when these occur.                                         Rehab Potential Good   Clinical Impairments Affecting Rehab Potential hx of macular degeneration and acoustic neuroma   PT Frequency 2x / week   PT Duration 8 weeks   PT Treatment/Interventions ADLs/Self Care Home Management;Electrical Stimulation;DME Instruction;Gait training;Stair training;Functional mobility training;Therapeutic activities;Therapeutic exercise;Balance training;Neuromuscular re-education;Patient/family education;Orthotic Fit/Training;Energy conservation;Vestibular;Visual/perceptual remediation/compensation   PT Next Visit Plan begin checking LTGs for anticipated discharge next week.   Consulted and Agree with Plan of Care Patient;Family member/caregiver   Family Member Consulted Rod Holler      Patient will benefit from skilled therapeutic intervention in order to improve the following deficits and impairments:  Abnormal gait, Decreased activity tolerance, Decreased balance, Decreased coordination, Decreased endurance, Decreased mobility, Decreased strength, Difficulty walking, Dizziness, Impaired perceived functional ability, Impaired UE functional use, Impaired vision/preception, Improper body mechanics, Postural dysfunction  Visit Diagnosis: Unsteadiness on feet  Other abnormalities of gait and mobility  Muscle weakness (generalized)  Other lack of coordination     Problem List Patient Active Problem List   Diagnosis Date Noted  . Essential hypertension   .  Alcohol abuse   . Thrombocytopenia ([place])   . Right-sided cerebrovascular accident (CVA) ([name])   . Chronic renal insufficiency, stage III (moderate) 06/02/2014  . Macular degeneration (senile) of retina 04/29/2014  . 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 Major  depressive disorder, single episode 06/11/2013  . TIA (transient ischemic attack) 06/03/2013  . Pancreatic cyst 01/29/2013  . OSA (obstructive sleep apnea) 12/26/2012  . Chronic diastolic CHF (congestive heart failure) ([place]) 08/19/2012  . COPD, minimal-mild 12/22/2010  . S/P excision of acoustic neuroma 06/18/2010  . Pulmonary nodule 06/17/2010  . Vertigo 06/14/2010  . THYROID NODULE 03/13/2010  . PVD- moderate carotid disease 03/07/2010  . Spinal stenosis, lumbar region, with neurogenic claudication 07/13/2009  . ALLERGIC RHINITIS 09/01/2008  . COLONIC POLYPS, HX OF 09/01/2008  . HYPERCHOLESTEROLEMIA 06/18/2008  . Angina, class II ([place]) 06/18/2008  . Hx of CABG 06/18/2008  . Moderate aortic stenosis 06/18/2008  . GASTROESOPHAGEAL REFLUX DISEASE 06/18/2008    Willow Ora, PTA, [facility] [address] [place],  [address] [phone] 08/26/15, 1:23 PM   Name: Andrew [name] MRN: [id number] Date of Birth: 1923-07-04

## 2015-08-29 ENCOUNTER — Ambulatory Visit: Payer: Medicare Other | Admitting: Physical Therapy

## 2015-08-29 ENCOUNTER — Encounter: Payer: Self-pay | Admitting: Physical Therapy

## 2015-08-29 DIAGNOSIS — R2689 Other abnormalities of gait and mobility: Secondary | ICD-10-CM

## 2015-08-29 DIAGNOSIS — M6281 Muscle weakness (generalized): Secondary | ICD-10-CM

## 2015-08-29 DIAGNOSIS — R2681 Unsteadiness on feet: Secondary | ICD-10-CM

## 2015-08-29 DIAGNOSIS — R278 Other lack of coordination: Secondary | ICD-10-CM

## 2015-08-30 NOTE — Therapy (Signed)
[facility] [address] [place], [place], [address] Phone: [phone]   Fax:  [phone]  Physical Therapy Treatment  Patient Details  Name: Andrew [name] MRN: [id number] Date of Birth: 1923-09-03 Referring Provider: Alysia Penna, MD  Encounter Date: 08/29/2015      PT End of Session - 08/29/15 1323    Visit Number 15   Number of Visits 17   Date for PT Re-Evaluation 09/06/15   Authorization Type UHC MCR-G Code every 10th visit   PT Start Time 4742   PT Stop Time 1400   PT Time Calculation (min) 43 min   Equipment Utilized During Treatment Gait belt   Activity Tolerance Patient tolerated treatment well   Behavior During Therapy [facility] for tasks assessed/performed      Past Medical History:  Diagnosis Date  . Allergic rhinitis   . AORTIC STENOSIS   . CAD, ARTERY BYPASS GRAFT   . CAROTID ARTERY STENOSIS 03/07/2010   80%  . CHF (congestive heart failure) ([place])   . COPD, mild ([place]) 12/22/2010  . GASTROESOPHAGEAL REFLUX DISEASE   . History of colonic polyps    1999, 2004  . History of prostate cancer 2002   s/p treatment with seeds / radiation  . HYPERCHOLESTEROLEMIA   . Lumbar spinal stenosis 07/13/2009  . Macular degeneration (senile) of retina 04/29/2014  . Pancreatic cyst 01/29/2013   Noted on MRI scan from [facility] [address] on 07/27/2011.  I reviewed report at patient request on 01-29-2013.  "Small cystic focus in the posterior pancreatic head.  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  Follow up MRI is advised. The main pancreatic duct remains normal in appearance."  Patient do  . S/P excision of acoustic neuroma   . Skin cancer   . Thyroid nodule 05/2010   Abnormal biopsy, 82 year old patient and his wife have made informed decision to not pursue surgical resection. Potential risk including cancer has been  thoroughly discussed with the patient.    Past Surgical History:  Procedure Laterality Date  . CATARACT EXTRACTION    . CATARACT EXTRACTION  2010  . CHOLECYSTECTOMY    . CORONARY ARTERY BYPASS GRAFT    . CRANIECTOMY FOR EXCISION OF ACOUSTIC NEUROMA  1985  . EYE SURGERY    . SHOULDER SURGERY    . TONSILLECTOMY AND ADENOIDECTOMY  1932  . TOTAL KNEE ARTHROPLASTY    . transperineal implatation of palladium      There were no vitals filed for this visit.      Subjective Assessment - 08/29/15 1322    Subjective No new falls to report. No new pains, usual back pain. Took a tylenol today before coming into therapy. Used cane all weekend without any issues to report.,   Patient is accompained by: Family member   Limitations Walking;House hold activities   Currently in Pain? No/denies   Pain Score 0-No pain             [facility] Adult PT Treatment/Exercise - 08/29/15 1324      Transfers   Transfers Sit to Stand;Stand to Sit   Sit to Stand 6: Modified independent (Device/Increase time);With upper extremity assist;From bed   Stand to Sit 6: Modified independent (Device/Increase time);With upper extremity assist;To bed     Ambulation/Gait   Ambulation/Gait Yes   Ambulation/Gait Assistance 4: Min guard   Ambulation Distance (Feet) 350 Feet  x2 reps   Assistive device Straight cane   Gait Pattern Step-through pattern;Decreased stride length;Decreased stance time - left;Decreased weight shift to left;Trunk flexed;Narrow base of support;Poor foot clearance - left   Ambulation Surface Level;Indoor   Gait velocity 19.87 sec's= 1.65 ft/sec with cane, min guard assist     Berg Balance Test   Sit to Stand Able to stand without using hands and stabilize independently   Standing Unsupported Able to stand safely 2 minutes   Sitting with Back Unsupported but Feet Supported on Floor or Stool Able to sit safely and securely 2 minutes   Stand to Sit Sits safely with minimal use of hands    Transfers Able to transfer safely, minor use of hands   Standing Unsupported with Eyes Closed Able to stand 10 seconds safely   Standing Ubsupported with Feet Together Able to place feet together independently and stand for 1 minute with supervision   From Standing, Reach Forward with Outstretched Arm Can reach forward >12 cm safely (5")  8 inches   From Standing Position, Pick up Object from Floor Able to pick up shoe, needs supervision   From Standing Position, Turn to Look Behind Over each Shoulder Looks behind one side only/other side shows less weight shift  left> right   Turn 360 Degrees Needs close supervision or verbal cueing   Standing Unsupported, Alternately Place Feet on Step/Stool Able to complete 4 steps without aid or supervision   Standing Unsupported, One Foot in Front Able to take small step independently and hold 30 seconds   Standing on One Leg Tries to lift leg/unable to hold 3 seconds but remains standing independently   Total Score 42             PT Short Term Goals - 08/08/15 1323      PT SHORT TERM GOAL #1   Title Pt will initiate HEP in order to improve functional mobility and decrease fall risk.  (Target Date: 08/05/15)   Baseline states doing multiple times per day   Status Achieved     PT SHORT TERM GOAL #2   Title Pt will improve BERG balance test to 32/56 in order to indicate decreased fall risk.     Baseline 34/56 on 08/08/15   Status Achieved     PT SHORT TERM GOAL #3   Title Pt will improve gait speed to 1.55 ft/sec in order to indicate decreased fall risk and improved efficiency of gait.     Baseline 1.86 ft/sec w/ RW and 1.86 ft/sec with SPC   Status Achieved     PT SHORT TERM GOAL #4   Title Pt will verbalize understanding of CVA warning signs and risk factors in order to decrease time to seeking medical attention in case of future stroke.     Baseline met 08/08/15   Status Achieved     PT SHORT TERM GOAL #5   Title Pt will ambulate 200'  w/ LRAD without foot up brace at mod I level in order to indicate improved strength and attention to LLE when walking over indoor surfaces.    Baseline ambulates at mod I level with RW without brace   Status Achieved           PT Long Term Goals - 08/30/15 1152      PT LONG TERM GOAL #1   Title Pt will be independent with HEP in order to indicate improved functional mobility and decreased fall risk.  (Target Date: 09/02/15)  Status On-going     PT LONG TERM GOAL #2   Title Pt will improve BERG balance score to >36/56 in order to indicate decreased fall risk.     Baseline 08/29/15: 42/56 scored today   Status Achieved     PT LONG TERM GOAL #3   Title Pt will improve gait speed to 2.15 ft/sec in order to indicate decreased fall risk and improved efficiency of gait, as well as limited community ambulator.    Baseline 08/29/15: 1.65 ft/sec with straight cane today   Status Not Met     PT LONG TERM GOAL #4   Title Pt will ambulate over indoor surfaces w/ SPC without foot up brace at mod I level in order to indicate increased independence with household ambulation.     Baseline 08/29/15: pt is supervision to min assist (varies) with straight cane on indoor surfaces   Status Partially Met     PT [place] #5   Title Pt will ambulate over paved outdoor surfaces (including ramp and curb) w/ SPC without foot up brace at mod I level in order to indicate increased independence in community.     Status On-going               Plan - 08/29/15 1323    Clinical Impression Statement Pt has met his Berg Balance Test score LTG as of today. Partially met his indoor gait goal and did not meet his gait speed goal. Remaining goals to be checked at next visit.   Rehab Potential Good   Clinical Impairments Affecting Rehab Potential hx of macular degeneration and acoustic neuroma   PT Frequency 2x / week   PT Duration 8 weeks   PT Treatment/Interventions ADLs/Self Care Home Management;Electrical  Stimulation;DME Instruction;Gait training;Stair training;Functional mobility training;Therapeutic activities;Therapeutic exercise;Balance training;Neuromuscular re-education;Patient/family education;Orthotic Fit/Training;Energy conservation;Vestibular;Visual/perceptual remediation/compensation   PT Next Visit Plan check remaining LTGs for discharge at next visit per PT plan of care   Consulted and Agree with Plan of Care Patient;Family member/caregiver   Family Member Consulted Rod Holler      Patient will benefit from skilled therapeutic intervention in order to improve the following deficits and impairments:  Abnormal gait, Decreased activity tolerance, Decreased balance, Decreased coordination, Decreased endurance, Decreased mobility, Decreased strength, Difficulty walking, Dizziness, Impaired perceived functional ability, Impaired UE functional use, Impaired vision/preception, Improper body mechanics, Postural dysfunction  Visit Diagnosis: Unsteadiness on feet  Other abnormalities of gait and mobility  Muscle weakness (generalized)  Other lack of coordination     Problem List Patient Active Problem List   Diagnosis Date Noted  . Essential hypertension   . Alcohol abuse   . Thrombocytopenia ([place])   . Right-sided cerebrovascular accident (CVA) ([place])   . Chronic renal insufficiency, stage III (moderate) 06/02/2014  . Macular degeneration (senile) of retina 04/29/2014  . 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 Major depressive disorder, single episode 06/11/2013  . TIA (transient ischemic attack) 06/03/2013  . Pancreatic cyst 01/29/2013  . OSA (obstructive sleep apnea) 12/26/2012  . Chronic diastolic CHF (congestive heart failure) ([place]) 08/19/2012  . COPD, minimal-mild 12/22/2010  . S/P excision of acoustic neuroma 06/18/2010  . Pulmonary nodule 06/17/2010  . Vertigo 06/14/2010  . THYROID NODULE 03/13/2010  . PVD- moderate carotid disease 03/07/2010  . Spinal stenosis, lumbar region, with  neurogenic claudication 07/13/2009  . ALLERGIC RHINITIS 09/01/2008  . COLONIC POLYPS, HX OF 09/01/2008  . HYPERCHOLESTEROLEMIA 06/18/2008  . Angina, class II ([place]) 06/18/2008  . Hx of CABG 06/18/2008  .  Moderate aortic stenosis 06/18/2008  . GASTROESOPHAGEAL REFLUX DISEASE 06/18/2008    Willow Ora, PTA, [facility] [address], [place], [place] [address] [phone] 08/30/15, 11:55 AM   Name: ASHELY JOSHUA MRN: [id number] Date of Birth: [birth year]

## 2015-09-01 ENCOUNTER — Ambulatory Visit: Payer: Medicare Other | Admitting: Rehabilitation

## 2015-09-01 ENCOUNTER — Encounter: Payer: Self-pay | Admitting: Rehabilitation

## 2015-09-01 DIAGNOSIS — R2681 Unsteadiness on feet: Secondary | ICD-10-CM

## 2015-09-01 DIAGNOSIS — R2689 Other abnormalities of gait and mobility: Secondary | ICD-10-CM

## 2015-09-01 DIAGNOSIS — M6281 Muscle weakness (generalized): Secondary | ICD-10-CM

## 2015-09-01 NOTE — Therapy (Signed)
[facility] [address] [place], [place], [address] Phone: [phone]   Fax:  [phone]  Physical Therapy Treatment and DC Summary  Patient Details  Name: Andrew [name] MRN: [id number] Date of Birth: 1923/06/06 Referring Provider: Alysia Penna, MD  Encounter Date: 09/01/2015      PT End of Session - 09/01/15 1329    Visit Number 16   Number of Visits 17   Date for PT Re-Evaluation 09/06/15   Authorization Type UHC MCR-G Code every 10th visit   PT Start Time 1320   PT Stop Time 1410   PT Time Calculation (min) 50 min   Equipment Utilized During Treatment Gait belt   Activity Tolerance Patient tolerated treatment well   Behavior During Therapy [facility] for tasks assessed/performed      Past Medical History:  Diagnosis Date  . Allergic rhinitis   . AORTIC STENOSIS   . CAD, ARTERY BYPASS GRAFT   . CAROTID ARTERY STENOSIS 03/07/2010   80%  . CHF (congestive heart failure) ([place])   . COPD, mild ([place]) 12/22/2010  . GASTROESOPHAGEAL REFLUX DISEASE   . History of colonic polyps    1999, 2004  . History of prostate cancer 2002   s/p treatment with seeds / radiation  . HYPERCHOLESTEROLEMIA   . Lumbar spinal stenosis 07/13/2009  . Macular degeneration (senile) of retina 04/29/2014  . Pancreatic cyst 01/29/2013   Noted on MRI scan from [facility] on 07/27/2011.  I reviewed report at patient request on 01-29-2013.  "Small cystic focus in the posterior pancreatic head.  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  Follow up MRI is advised. The main pancreatic duct remains normal in appearance."  Patient do  . S/P excision of acoustic neuroma   . Skin cancer   . Thyroid nodule 05/2010   Abnormal biopsy, 82 year old patient and his wife have made informed decision to not pursue surgical resection. Potential risk including  cancer has been thoroughly discussed with the patient.    Past Surgical History:  Procedure Laterality Date  . CATARACT EXTRACTION    . CATARACT EXTRACTION  2010  . CHOLECYSTECTOMY    . CORONARY ARTERY BYPASS GRAFT    . CRANIECTOMY FOR EXCISION OF ACOUSTIC NEUROMA  1985  . EYE SURGERY    . SHOULDER SURGERY    . TONSILLECTOMY AND ADENOIDECTOMY  1932  . TOTAL KNEE ARTHROPLASTY    . transperineal implatation of palladium      There were no vitals filed for this visit.      Subjective Assessment - 09/01/15 1328    Subjective Reports no falls, states back pain, but does state that he is due to return to [place] in order to continue to get cortisone shots.     Patient is accompained by: Family member   Limitations Walking;House hold activities   Patient Stated Goals "I want to get rid of the walker and get back to the cane."    Currently in Pain? No/denies             NMR:  Went over HEP for balance and LLE NMR during session, see pt instruction for details on reps and exercises performed.    Gait:  Assessed gait outdoors with RW as PT does not feel that he is safe at this time to do so with SPC.  He is able to ambulate >300'  with RW at mod I level as well as curb step negotiation.  Continue to highly recommend that he use RW at all times outdoors as well as indoors when wife/kids not present to prevent fall.  Pt and wife verbalized understanding.   Self care: Discussed progressing to [facility].  Educated wife that he would need to continue to work on at home, however PT feels that transition may be difficult and may not be safe due to balance and visual deficits.  Also recommended that if pt does use SPC in community that son/grandson must be present to provide hands on assist to prevent fall and or injury of pt and wife.  Both verbalized understanding.                      PT Education - 09/01/15 1604    Education provided Yes   Education Details Recommendations for  continued use of RW outdoors due to poor balance and safety concerns, liklihood that he will always need RW outdoors and education that pt could be mod I at home with RW.     Person(s) Educated Patient;Spouse   Methods Explanation   Comprehension Verbalized understanding          PT Short Term Goals - 08/08/15 1323      PT SHORT TERM GOAL #1   Title Pt will initiate HEP in order to improve functional mobility and decrease fall risk.  (Target Date: 08/05/15)   Baseline states doing multiple times per day   Status Achieved     PT SHORT TERM GOAL #2   Title Pt will improve BERG balance test to 32/56 in order to indicate decreased fall risk.     Baseline 34/56 on 08/08/15   Status Achieved     PT SHORT TERM GOAL #3   Title Pt will improve gait speed to 1.55 ft/sec in order to indicate decreased fall risk and improved efficiency of gait.     Baseline 1.86 ft/sec w/ RW and 1.86 ft/sec with SPC   Status Achieved     PT SHORT TERM GOAL #4   Title Pt will verbalize understanding of CVA warning signs and risk factors in order to decrease time to seeking medical attention in case of future stroke.     Baseline met 08/08/15   Status Achieved     PT SHORT TERM GOAL #5   Title Pt will ambulate 200' w/ LRAD without foot up brace at mod I level in order to indicate improved strength and attention to LLE when walking over indoor surfaces.    Baseline ambulates at mod I level with RW without brace   Status Achieved           PT Long Term Goals - 09/01/15 1329      PT LONG TERM GOAL #1   Title Pt will be independent with HEP in order to indicate improved functional mobility and decreased fall risk.  (Target Date: 09/02/15)   Baseline met 09/01/15   Status Achieved     PT LONG TERM GOAL #2   Title Pt will improve BERG balance score to >36/56 in order to indicate decreased fall risk.     Baseline 08/29/15: 42/56 scored today   Status Achieved     PT LONG TERM GOAL #3   Title Pt will improve  gait speed to 2.15 ft/sec in order to indicate decreased fall risk and improved efficiency of gait, as well as limited community ambulator.  Baseline 08/29/15: 1.65 ft/sec with straight cane today   Status Not Met     PT LONG TERM GOAL #4   Title Pt will ambulate over indoor surfaces w/ SPC without foot up brace at mod I level in order to indicate increased independence with household ambulation.     Baseline 08/29/15: pt is supervision to min assist (varies) with straight cane on indoor surfaces   Status Partially Met     PT LONG TERM GOAL #5   Title Pt will ambulate over paved outdoor surfaces (including ramp and curb) w/ SPC without foot up brace at mod I level in order to indicate increased independence in community.     Baseline Ambulatory with RW outdoors without foot up brace at mod I level   Status Not Met               Plan - 2015-09-05 1613    Clinical Impression Statement Pt has met 2/5 goals total, partially meeting 3rd goal, however did not meet gait speed goal nor did he meet outdoor gait goal as he is safer with RW at this time and feel that he will likely always need RW due to balance and visual deficits.  Pt and wife verbalized understanding. Both agree to D/C.     Rehab Potential Good   Clinical Impairments Affecting Rehab Potential hx of macular degeneration and acoustic neuroma   PT Frequency 2x / week   PT Duration 8 weeks   PT Treatment/Interventions ADLs/Self Care Home Management;Electrical Stimulation;DME Instruction;Gait training;Stair training;Functional mobility training;Therapeutic activities;Therapeutic exercise;Balance training;Neuromuscular re-education;Patient/family education;Orthotic Fit/Training;Energy conservation;Vestibular;Visual/perceptual remediation/compensation   PT Next Visit Plan n/a   Consulted and Agree with Plan of Care Patient;Family member/caregiver   Family Member Consulted Windell Moulding      Patient will benefit from skilled therapeutic  intervention in order to improve the following deficits and impairments:  Abnormal gait, Decreased activity tolerance, Decreased balance, Decreased coordination, Decreased endurance, Decreased mobility, Decreased strength, Difficulty walking, Dizziness, Impaired perceived functional ability, Impaired UE functional use, Impaired vision/preception, Improper body mechanics, Postural dysfunction  Visit Diagnosis: Unsteadiness on feet  Other abnormalities of gait and mobility  Muscle weakness (generalized)       G-Codes - 05-Sep-2015 1616    Functional Assessment Tool Used BERG 42/56 on 08/28/08   Functional Limitation Mobility: Walking and moving around   Mobility: Walking and Moving Around Current Status [phone]) At least 20 percent but less than 40 percent impaired, limited or restricted   Mobility: Walking and Moving Around Goal Status [phone]) At least 20 percent but less than 40 percent impaired, limited or restricted   Mobility: Walking and Moving Around Discharge Status [phone]) At least 20 percent but less than 40 percent impaired, limited or restricted      PHYSICAL THERAPY DISCHARGE SUMMARY  Visits from Start of Care: 16  Current functional level related to goals / functional outcomes: See LTG's above   Remaining deficits: Continues to have balance deficits, pt given HEP to address with max education to use RW if at home alone, cane with S/min A and RW in community.    Education / Equipment: HEP  Plan: Patient agrees to discharge.  Patient goals were partially met. Patient is being discharged due to meeting the stated rehab goals.  ?????        Problem List Patient Active Problem List   Diagnosis Date Noted  . Essential hypertension   . Alcohol abuse   . Thrombocytopenia ([facility])   . Right-sided  cerebrovascular accident (CVA) ([place])   . Chronic renal insufficiency, stage III (moderate) 06/02/2014  . Macular degeneration (senile) of retina 04/29/2014  . Pseudodementia  07/30/2013  . Major depressive disorder, single episode 06/11/2013  . TIA (transient ischemic attack) 06/03/2013  . Pancreatic cyst 01/29/2013  . OSA (obstructive sleep apnea) 12/26/2012  . Chronic diastolic CHF (congestive heart failure) ([place]) 08/19/2012  . COPD, minimal-mild 12/22/2010  . S/P excision of acoustic neuroma 06/18/2010  . Pulmonary nodule 06/17/2010  . Vertigo 06/14/2010  . THYROID NODULE 03/13/2010  . PVD- moderate carotid disease 03/07/2010  . Spinal stenosis, lumbar region, with neurogenic claudication 07/13/2009  . ALLERGIC RHINITIS 09/01/2008  . COLONIC POLYPS, HX OF 09/01/2008  . HYPERCHOLESTEROLEMIA 06/18/2008  . Angina, class II ([place]) 06/18/2008  . Hx of CABG 06/18/2008  . Moderate aortic stenosis 06/18/2008  . GASTROESOPHAGEAL REFLUX DISEASE 06/18/2008    Cameron Sprang, PT, MPT [facility] [address] [place], [place], [address] Phone: [phone]   Fax:  [phone] 09/01/15, 4:25 PM  Name: Andrew [name] MRN: [id number] Date of Birth: 06-03-1923

## 2015-09-01 NOTE — Patient Instructions (Addendum)
Hip Flexion / Knee Extension: Straight-Leg Raise (Eccentric)    Lie on back. Lift leg with knee straight. Slowly lower leg for 3-5 seconds.  Keep your opposite leg bent to relieve pressure from your back.   __8_ reps per set, _2__ sets per day, _5-7__ days per week.  Copyright  VHI. All rights reserved.   Abduction    Lift leg up toward ceiling. Return. You do not need a weight on your ankle.  Focus on keeping movement really controlled (don't let your leg "jump') Repeat __10__ times each leg. Do _2___ sessions per day.  http://gt2.exer.us/385   Copyright  VHI. All rights reserved.   "I love a Parade" Lift    Using a chair if necessary, march in place and bring your knees as high as you can.  Repeat_20 times on each side.  Do 2 sessions per day.  Use your walker for balance.  Stand tall!! http://gt2.exer.us/344   Feet Together, Varied Arm Positions - Eyes Closed    Stand with feet together and arms as needed for balance/at sides. Close eyes and visualize upright position. Hold __15_ seconds. Repeat __3__ times per session. Do __1-2__ sessions per day.  Copyright  VHI. All rights reserved.    Feet Together, Head Motion - Eyes Open    With eyes open, feet together, move head slowly:  1. Up and down. 2. Left and right 3. Diagonal both ways Repeat __10__ times each one. Do _1-2_ sessions per day.  Copyright  VHI. All rights reserved.    At countertop/sturdy table for balance support:    Single Leg - Eyes Open    Holding support, lift right leg while maintaining balance over other leg. Progress to removing hands from support surface for longer periods of time. Hold__10-15__ seconds. Repeat with standing on right leg and lifting up left leg. Repeat _3_ times with each leg. Do _1-2_ sessions per day.  Copyright  VHI. All rights reserved.   Tandem Stance    Holding onto counter top with both hands:  Right foot in front of left, in a  straight line. Balance in this position _10-15__ seconds. Then stand with left foot in front of right foot and hold this for 10-15 seconds. Perform 3 reps with each foot in the front. 1-2 times a day.

## 2015-09-05 ENCOUNTER — Encounter: Payer: Self-pay | Admitting: Neurology

## 2015-09-05 ENCOUNTER — Ambulatory Visit (INDEPENDENT_AMBULATORY_CARE_PROVIDER_SITE_OTHER): Payer: Medicare Other | Admitting: Neurology

## 2015-09-05 VITALS — BP 140/61 | HR 55 | Ht 69.0 in | Wt 194.0 lb

## 2015-09-05 DIAGNOSIS — R413 Other amnesia: Secondary | ICD-10-CM | POA: Diagnosis not present

## 2015-09-05 DIAGNOSIS — I639 Cerebral infarction, unspecified: Secondary | ICD-10-CM | POA: Diagnosis not present

## 2015-09-05 DIAGNOSIS — G3184 Mild cognitive impairment, so stated: Secondary | ICD-10-CM

## 2015-09-05 DIAGNOSIS — I6381 Other cerebral infarction due to occlusion or stenosis of small artery: Secondary | ICD-10-CM

## 2015-09-05 NOTE — Patient Instructions (Signed)
I had a long d/w patient and his wife about his recent stroke, risk for recurrent stroke/TIAs, personally independently reviewed imaging studies and stroke evaluation results and answered questions.Continue aspirin 325 mg daily  for secondary stroke prevention and maintain strict control of hypertension with blood pressure goal below 130/90, diabetes with hemoglobin A1c goal below 6.5% and lipids with LDL cholesterol goal below 70 mg/dL. I also advised the patient to eat a healthy diet with plenty of whole grains, cereals, fruits and vegetables, exercise regularly and maintain ideal body weight .patient also advised fall and safety precautions and I recommend he use a walker at all times. He was encouraged to keep his upcoming appointment with his cardiologist to discuss his chest pain on exertion. I also encouraged him to keep himself busy in mentally challenging activities like solving crossword puzzles, sudoku and playing bridge to help with his age-related mild cognitive impairment. Followup in the future with me in  6 months or call earlier if necessary  Fall Prevention in the Home  Falls can cause injuries. They can happen to people of all ages. There are many things you can do to make your home safe and to help prevent falls.  WHAT CAN I DO ON THE OUTSIDE OF MY HOME?  Regularly fix the edges of walkways and driveways and fix any cracks.  Remove anything that might make you trip as you walk through a door, such as a raised step or threshold.  Trim any bushes or trees on the path to your home.  Use bright outdoor lighting.  Clear any walking paths of anything that might make someone trip, such as rocks or tools.  Regularly check to see if handrails are loose or broken. Make sure that both sides of any steps have handrails.  Any raised decks and porches should have guardrails on the edges.  Have any leaves, snow, or ice cleared regularly.  Use sand or salt on walking paths during  winter.  Clean up any spills in your garage right away. This includes oil or grease spills. WHAT CAN I DO IN THE BATHROOM?   Use night lights.  Install grab bars by the toilet and in the tub and shower. Do not use towel bars as grab bars.  Use non-skid mats or decals in the tub or shower.  If you need to sit down in the shower, use a plastic, non-slip stool.  Keep the floor dry. Clean up any water that spills on the floor as soon as it happens.  Remove soap buildup in the tub or shower regularly.  Attach bath mats securely with double-sided non-slip rug tape.  Do not have throw rugs and other things on the floor that can make you trip. WHAT CAN I DO IN THE BEDROOM?  Use night lights.  Make sure that you have a light by your bed that is easy to reach.  Do not use any sheets or blankets that are too big for your bed. They should not hang down onto the floor.  Have a firm chair that has side arms. You can use this for support while you get dressed.  Do not have throw rugs and other things on the floor that can make you trip. WHAT CAN I DO IN THE KITCHEN?  Clean up any spills right away.  Avoid walking on wet floors.  Keep items that you use a lot in easy-to-reach places.  If you need to reach something above you, use a strong step stool  that has a grab bar.  Keep electrical cords out of the way.  Do not use floor polish or wax that makes floors slippery. If you must use wax, use non-skid floor wax.  Do not have throw rugs and other things on the floor that can make you trip. WHAT CAN I DO WITH MY STAIRS?  Do not leave any items on the stairs.  Make sure that there are handrails on both sides of the stairs and use them. Fix handrails that are broken or loose. Make sure that handrails are as long as the stairways.  Check any carpeting to make sure that it is firmly attached to the stairs. Fix any carpet that is loose or worn.  Avoid having throw rugs at the top or  bottom of the stairs. If you do have throw rugs, attach them to the floor with carpet tape.  Make sure that you have a light switch at the top of the stairs and the bottom of the stairs. If you do not have them, ask someone to add them for you. WHAT ELSE CAN I DO TO HELP PREVENT FALLS?  Wear shoes that:  Do not have high heels.  Have rubber bottoms.  Are comfortable and fit you well.  Are closed at the toe. Do not wear sandals.  If you use a stepladder:  Make sure that it is fully opened. Do not climb a closed stepladder.  Make sure that both sides of the stepladder are locked into place.  Ask someone to hold it for you, if possible.  Clearly mark and make sure that you can see:  Any grab bars or handrails.  First and last steps.  Where the edge of each step is.  Use tools that help you move around (mobility aids) if they are needed. These include:  Canes.  Walkers.  Scooters.  Crutches.  Turn on the lights when you go into a dark area. Replace any light bulbs as soon as they burn out.  Set up your furniture so you have a clear path. Avoid moving your furniture around.  If any of your floors are uneven, fix them.  If there are any pets around you, be aware of where they are.  Review your medicines with your doctor. Some medicines can make you feel dizzy. This can increase your chance of falling. Ask your doctor what other things that you can do to help prevent falls.   This information is not intended to replace advice given to you by your health care provider. Make sure you discuss any questions you have with your health care provider.   Document Released: 11/04/2008 Document Revised: 05/25/2014 Document Reviewed: 02/12/2014 Elsevier Interactive Patient Education [employer].

## 2015-09-05 NOTE — Progress Notes (Signed)
[facility] [address] [place]. [place] [address] [phone]       OFFICE FOLLOW-UP NOTE  [name] Date of Birth:  11/10/1923 Medical Record Number:  [id number]   HPI: [name] is a 82 year old Caucasian male seen today for follow-up after first office visit following admission for stroke in May 2017. He is accompanied today by his wife.82 y.o. male with medical history significant for HTN, HLD, CAD s/p CABG, COPD, CHF, presenting to the ED with one day history of progressive left sided weakness accompanied by falls x2 . He never had a similar episode. Denies vertigo dizziness or vision changes. Denies headaches. No dysarthria. No dysphagia. No confusion. No seizures. No new urinary symptoms (known urge incontinence) Denies any chest pain, or shortness of breath. Denies any fever or chills, or night sweats.Does not smoke. No new meds or hormonal supplements. Does take a regular ASA a day, with no other antiplatelets or anticoagulants. Patient is compliant with his medications. Denies any recent long distance trips. No recent surgeries. No sick contacts. No new stressors present at work and in personal life. He is very active, exercising daily. He is not a diabetic. No family history of stroke but does have a history of TIA in 2015. Patient was not administered TPA as is beyond time window for treatment consideration. MRI scan of the brain showed a small acute nonhemorrhagic infarct involving posterior limb of internal capsule with a questionable tiny left parietal infarct as well. Both were felt to be due to small vessel disease. Carotid ultrasound showed 1-39% right ICA in 6-79% left ICA stenosis which was felt to be asymptomatic. Transthoracic echo showed normal ejection fraction. LDL cholesterol was elevated at 56% grams percent. Hemoglobin A1c was 5.4. Patient was started on aspirin for stroke prevention and transferred to [facility] for inpatient  rehabilitation. His done well. He is now walking with a walker. He still feels that his left leg is dragging at times. He feels he gets exertional chest pain which improves with use of nitroglycerin spray. He has an upcoming appointment with cardiologist Dr. Aundra Dubin next week to discuss this. Patient has had one fall since discharge he feels his left foot got stuck. He has also noticed memory difficulties since this stroke. He has finished outpatient physical and occupational therapy last week. Is tolerating aspirin well without bleeding or bruising. States his blood pressure is well controlled and today it is 140/61 which is slightly high.  ROS:   14 system review of systems is positive for  hearing loss, loss of vision, joint pain, easy bruising, memory loss, confusion, tumors sleep, decreased energy and all other systems negative  PMH:  Past Medical History:  Diagnosis Date  . Allergic rhinitis   . AORTIC STENOSIS   . CAD, ARTERY BYPASS GRAFT   . CAROTID ARTERY STENOSIS 03/07/2010   80%  . CHF (congestive heart failure) ([name])   . COPD, mild ([place]) 12/22/2010  . GASTROESOPHAGEAL REFLUX DISEASE   . History of colonic polyps    1999, 2004  . History of prostate cancer 2002   s/p treatment with seeds / radiation  . HYPERCHOLESTEROLEMIA   . Lumbar spinal stenosis 07/13/2009  . Macular degeneration (senile) of retina 04/29/2014  . Pancreatic cyst 01/29/2013   Noted on MRI scan from [facility] on 07/27/2011.  I reviewed report at patient request on 01-29-2013.  "Small cystic focus in the posterior pancreatic head.  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  Follow up MRI is advised. The main pancreatic duct remains normal in appearance."  Patient do  . S/P excision of acoustic neuroma   . Skin cancer   . Stroke ([place])   . Thyroid nodule 05/2010   Abnormal biopsy, 82 year old patient and his wife  have made informed decision to not pursue surgical resection. Potential risk including cancer has been thoroughly discussed with the patient.    Social History:  Social History   Social History  . Marital status: Married    Spouse name: N/A  . Number of children: N/A  . Years of education: N/A   Occupational History  . retired  At [employer]    and [employer] Topics  . Smoking status: Former Smoker    Packs/day: 2.00    Years: 20.00    Types: Cigarettes    Quit date: 01/22/1961  . Smokeless tobacco: Never Used  . Alcohol use 4.2 oz/week    7 Shots of liquor per week     Comment: 1 drink nightly  . Drug use: No  . Sexual activity: Not on file   Other Topics Concern  . Not on file   Social History Narrative  . No narrative on file    Medications:   Current Outpatient Prescriptions on File Prior to Visit  Medication Sig Dispense Refill  . acetaminophen (TYLENOL) 500 MG tablet Take 500 mg by mouth every 6 (six) hours as needed (pain).    Marland Kitchen albuterol (PROVENTIL HFA;VENTOLIN HFA) 108 (90 BASE) MCG/ACT inhaler Inhale 2 puffs into the lungs every 6 (six) hours as needed for wheezing or shortness of breath. 1 Inhaler 3  . aspirin 325 MG tablet Take 325 mg by mouth daily.    . B Complex-C-Folic Acid (NEPHRO-VITE PO) Take 1 tablet by mouth daily.      . cholecalciferol (VITAMIN D) 400 units TABS tablet Take 400 Units by mouth daily.    [name] 10 MG tablet Take 1 tablet (10 mg total) by mouth daily. 90 tablet 1  . furosemide (LASIX) 40 MG tablet Take as directed 135 tablet 3  . isosorbide mononitrate (IMDUR) 60 MG 24 hr tablet Take 90 mg by mouth daily.    . metoprolol succinate (TOPROL-XL) 25 MG 24 hr tablet Take 25 mg by mouth daily.    . Multiple Vitamins-Minerals (PRESERVISION/LUTEIN) CAPS Take 1 capsule by mouth 2 (two) times daily.    . nitroGLYCERIN (NITROLINGUAL) 0.4 MG/SPRAY spray PLACE 1 SPRAY UNDER THE TONGUE EVERY 5 (FIVE) MINUTES  AS NEEDED. 4.9 g 12  . OVER THE COUNTER MEDICATION Place 1 drop into both eyes daily as needed (dry eyes). OTC lubricating eye drop    . potassium chloride (K-DUR) 10 MEQ tablet Take 2 tablets (20 mEq total) by mouth daily. 60 tablet 3  . pravastatin (PRAVACHOL) 40 MG tablet Take 1 tablet (40 mg total) by mouth daily. 30 tablet 1   No current facility-administered medications on file prior to visit.     Allergies:  No Known Allergies  Physical Exam General: well developed, well nourished elderly Caucasian male, seated, in no evident distress Head: head normocephalic and atraumatic.  Neck: supple with no carotid or supraclavicular bruits Cardiovascular: regular rate and rhythm, no murmurs Musculoskeletal: no deformity Skin:  no rash/petichiae Vascular:  Normal pulses all extremities Vitals:   09/05/15 1259  BP: 140/61  Pulse: (!) 55   Neurologic Exam Mental Status: Awake and fully alert. Oriented to place and time. Recent and remote memory intact. Attention span, concentration and fund of knowledge appropriate. Mood and affect appropriate. Diminished recall 1/3. Animal naming 5 only. Clock drawing 3/4. Calculation good. Cranial Nerves: Fundoscopic exam not done. 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 Extraocular movements full without nystagmus. Visual fields full to confrontation. Hearing diminished bilaterally. Facial sensation intact. Face, tongue, palate moves normally and symmetrically.  Motor: Normal bulk and tone. Normal strength in all tested extremity muscles. Sensory.: intact to touch ,pinprick .position and vibratory sensation.  Coordination: Rapid alternating movements normal in all extremities. Finger-to-nose and heel-to-shin performed accurately bilaterally. Gait and Station: Arises from chair without difficulty. Stance is slightly wide-based. Uses a walker slight dragging of the left leg.  Not ablle to heel, toe and tandem walk  without difficulty.  Reflexes: 1+ and symmetric. Toes downgoing.   NIHSS  2 Modified Rankin 3   ASSESSMENT: 5 year Caucasian male with right posterior limb internal capsule infarct in May 2017 due to small vessel disease was doing well except for mild dragging of the left leg. Vascular risk factors of hypertension hyperlipidemia, age and sex. Mild memory difficulties due to age-related mild cognitive impairment.  PLAN: I had a long d/w patient and his wife about his recent stroke, risk for recurrent stroke/TIAs, personally independently reviewed imaging studies and stroke evaluation results and answered questions.Continue aspirin 325 mg daily  for secondary stroke prevention and maintain strict control of hypertension with blood pressure goal below 130/90, diabetes with hemoglobin A1c goal below 6.5% and lipids with LDL cholesterol goal below 70 mg/dL. I also advised the patient to eat a healthy diet with plenty of whole grains, cereals, fruits and vegetables, exercise regularly and maintain ideal body weight .patient also advised fall and safety precautions and I recommend he use a walker at all times. He was encouraged to keep his upcoming appointment with his cardiologist to discuss his chest pain on exertion. I also encouraged him to keep himself busy in mentally challenging activities like solving crossword puzzles, sudoku and playing bridge to help with his age-related mild cognitive impairment. Followup in the future with me in  6 months or call earlier if necessary Greater than 50% of time during this 25 minute visit was spent on counseling,explanation of diagnosis, planning of further management, discussion with patient and family and coordination of care Antony Contras, MD  [facility] [address] [place], [place] [address]  Phone [phone] Fax [phone] Note: This document was prepared with digital dictation and possible smart phrase technology.  Any transcriptional errors that result from this process are unintentional

## 2015-09-12 ENCOUNTER — Encounter: Payer: Self-pay | Admitting: Family Medicine

## 2015-09-12 ENCOUNTER — Ambulatory Visit (INDEPENDENT_AMBULATORY_CARE_PROVIDER_SITE_OTHER): Payer: Medicare Other

## 2015-09-12 ENCOUNTER — Ambulatory Visit (INDEPENDENT_AMBULATORY_CARE_PROVIDER_SITE_OTHER): Payer: Medicare Other | Admitting: Family Medicine

## 2015-09-12 VITALS — BP 132/68 | HR 51 | Temp 98.6°F | Ht 68.0 in | Wt 192.5 lb

## 2015-09-12 DIAGNOSIS — Z0000 Encounter for general adult medical examination without abnormal findings: Secondary | ICD-10-CM

## 2015-09-12 DIAGNOSIS — Z23 Encounter for immunization: Secondary | ICD-10-CM

## 2015-09-12 NOTE — Progress Notes (Signed)
PCP notes:   Health maintenance:  PPSV23 - administered Flu vaccine - addressed  Abnormal screenings:   Cognitive/Mini-Cog score: 19/20 Fall risk: Hx of fall without injury  Patient concerns:   None  Nurse concerns:  None  Next PCP appt:   09/12/15 @ 1445

## 2015-09-12 NOTE — Progress Notes (Signed)
Subjective:   Andrew [name] is a 82 y.o. male who presents for Medicare Annual/Subsequent preventive examination.  Review of Systems:  N/A Cardiac Risk Factors include: advanced age (>96men, >46 women);male gender;hypertension     Objective:    Vitals: BP 132/68 (BP Location: Left Arm, Patient Position: Sitting, Cuff Size: Normal)   Pulse (!) 51   Temp 98.6 F (37 C) (Oral)   Ht 5\' 8"  (1.727 m) Comment: shoes  Wt 192 lb 8 oz (87.3 kg)   SpO2 96%   BMI 29.27 kg/m   Body mass index is 29.27 kg/m.  Tobacco History  Smoking Status  . Former Smoker  . Packs/day: 2.00  . Years: 20.00  . Types: Cigarettes  . Quit date: 01/22/1961  Smokeless Tobacco  . Never Used     Counseling given: No   Past Medical History:  Diagnosis Date  . Allergic rhinitis   . AORTIC STENOSIS   . CAD, ARTERY BYPASS GRAFT   . CAROTID ARTERY STENOSIS 03/07/2010   80%  . CHF (congestive heart failure) ([place])   . COPD, mild ([place]) 12/22/2010  . GASTROESOPHAGEAL REFLUX DISEASE   . History of colonic polyps    1999, 2004  . History of prostate cancer 2002   s/p treatment with seeds / radiation  . HYPERCHOLESTEROLEMIA   . Lumbar spinal stenosis 07/13/2009  . Macular degeneration (senile) of retina 04/29/2014  . Pancreatic cyst 01/29/2013   Noted on MRI scan from [facility] on 07/27/2011.  I reviewed report at patient request on 01-29-2013.  "Small cystic focus in the posterior pancreatic head.  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  Follow up MRI is advised. The main pancreatic duct remains normal in appearance."  Patient do  . S/P excision of acoustic neuroma   . Skin cancer   . Stroke ([place])   . Thyroid nodule 05/2010   Abnormal biopsy, 82 year old patient and his wife have made informed decision to not pursue surgical resection. Potential risk including cancer has been thoroughly discussed with  the patient.   Past Surgical History:  Procedure Laterality Date  . CATARACT EXTRACTION    . CATARACT EXTRACTION  2010  . CHOLECYSTECTOMY    . CORONARY ARTERY BYPASS GRAFT    . CRANIECTOMY FOR EXCISION OF ACOUSTIC NEUROMA  1985  . EYE SURGERY    . SHOULDER SURGERY    . TONSILLECTOMY AND ADENOIDECTOMY  1932  . TOTAL KNEE ARTHROPLASTY    . transperineal implatation of palladium     Family History  Problem Relation Age of Onset  . Emphysema Brother   . Lung cancer Brother   . Heart disease Father   . Hypertension Father   . Heart attack Father   . Colon cancer Sister   . Non-Hodgkin's lymphoma Sister   . Heart disease Brother   . Colon cancer Sister   . Skin cancer Sister   . Alcohol abuse    . Arthritis    . Cancer    . Macular degeneration    . Lung cancer Daughter    History  Sexual Activity  . Sexual activity: No    Outpatient Encounter Prescriptions as of 09/12/2015  Medication Sig  . acetaminophen (TYLENOL) 500 MG tablet Take 500 mg by mouth every 6 (six) hours as needed (pain).  Marland Kitchen albuterol (PROVENTIL HFA;VENTOLIN HFA) 108 (90 BASE) MCG/ACT inhaler Inhale 2 puffs  into the lungs every 6 (six) hours as needed for wheezing or shortness of breath.  Marland Kitchen aspirin 325 MG tablet Take 325 mg by mouth daily.  . B Complex-C-Folic Acid (NEPHRO-VITE PO) Take 1 tablet by mouth daily.    . cholecalciferol (VITAMIN D) 1000 units tablet Take 1,000 Units by mouth daily.  [name] 10 MG tablet Take 1 tablet (10 mg total) by mouth daily.  . furosemide (LASIX) 40 MG tablet Take as directed  . isosorbide mononitrate (IMDUR) 60 MG 24 hr tablet Take 90 mg by mouth daily.  . metoprolol succinate (TOPROL-XL) 25 MG 24 hr tablet Take 25 mg by mouth daily.  . Multiple Vitamins-Minerals (PRESERVISION/LUTEIN) CAPS Take 1 capsule by mouth 2 (two) times daily.  . nitroGLYCERIN (NITROLINGUAL) 0.4 MG/SPRAY spray PLACE 1 SPRAY UNDER THE TONGUE EVERY 5 (FIVE) MINUTES AS NEEDED.  Marland Kitchen OVER THE  COUNTER MEDICATION Place 1 drop into both eyes daily as needed (dry eyes). OTC lubricating eye drop  . potassium chloride (K-DUR) 10 MEQ tablet Take 2 tablets (20 mEq total) by mouth daily.  . pravastatin (PRAVACHOL) 40 MG tablet Take 1 tablet (40 mg total) by mouth daily.   No facility-administered encounter medications on file as of 09/12/2015.     Activities of Daily Living In your present state of health, do you have any difficulty performing the following activities: 09/12/2015 09/12/2015  Hearing? Tempie Donning  Vision? Y Y  Difficulty concentrating or making decisions? Tempie Donning  Walking or climbing stairs? Y Y  Dressing or bathing? N N  Doing errands, shopping? Tempie Donning  Preparing Food and eating ? Y Y  Using the Toilet? N N  In the past six months, have you accidently leaked urine? Y Y  Do you have problems with loss of bowel control? N N  Managing your Medications? N N  Managing your Finances? Tempie Donning  Housekeeping or managing your Housekeeping? 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 Increase physical activity          Starting 09/12/2015, I will continue to exercise for at least 30 min on recumbent bike and to do at least 10-15 min of bed exercises daily.       Fall Risk Fall Risk  09/12/2015 09/12/2015 09/05/2015 07/12/2015 11/01/2014  Falls in the past year? Yes Yes No No Yes  Number falls in past yr: 1 1 - - 2 or more  Injury  with Fall? No No - - Yes  Risk Factor Category  - - - - High Fall Risk  Risk for fall due to : History of fall(s);Impaired balance/gait;Impaired mobility;Impaired vision History of fall(s);Impaired balance/gait;Impaired mobility;Impaired vision - - History of fall(s);Impaired mobility  Follow up Falls evaluation completed Falls evaluation completed - - -   Depression Screen PHQ 2/9 Scores 09/12/2015 09/12/2015 07/12/2015 11/01/2014  PHQ - 2 Score 0 0 3 0  PHQ- 9 Score - - 5 -    Cognitive Testing  MMSE - Mini Mental State Exam 09/12/2015 09/12/2015  Orientation to time 5 5  Orientation to Place 5 5  Registration 3 3  Attention/ Calculation 0 0  Recall 2 2  Recall-comments - pt was unable to recall 1 of 3 words without a cue  Language- name 2 objects 0 0  Language- repeat 1 1  Language- follow 3 step command 3 3  Language- read & follow direction 0 0  Write a sentence 0 0  Copy design 0 0  Total score 19 19   PLEASE NOTE: A Mini-Cog screen was completed. Maximum score is 20. A value of 0 denotes this part of [place] MMSE was not completed or the patient failed this part of the Mini-Cog screening.   Mini-Cog Screening Orientation to Time - Max 5 pts Orientation to Place - Max 5 pts Registration - Max 3 pts Recall - Max 3 pts Language Repeat - Max 1 pts Language Follow 3 Step Command - Max 3 pts  Immunization History  Administered Date(s) Administered  . Influenza Split 10/26/2010, 10/12/2012  . Influenza Whole 11/03/2008, 10/07/2009  . Influenza, High Dose Seasonal PF 10/07/2013, 10/19/2014  . Pneumococcal Conjugate-13 04/28/2014  . Pneumococcal Polysaccharide-23 09/12/2015  . Tdap 12/20/2010  . Zoster 01/22/2006   Screening Tests Health Maintenance  Topic Date Due  . INFLUENZA VACCINE  01/22/2016 (Originally 08/23/2015)  . TETANUS/TDAP  12/19/2020  . ZOSTAVAX  Completed  . PNA vac Low Risk Adult  Completed      Plan:     I have personally reviewed and addressed the  Medicare Annual Wellness questionnaire and have noted the following in the patient's chart:  A. Medical and social history B. Use of alcohol, tobacco or illicit drugs  C. Current medications and supplements D. Functional ability and status E.  Nutritional status F.  Physical activity G. Advance directives H. List of other physicians I.  Hospitalizations, surgeries, and ER visits in previous 12 months J.  [place] to include hearing, vision, cognitive, depression L. Referrals and appointments - none  In addition, I have reviewed and discussed with patient certain preventive protocols, quality metrics, and best practice recommendations. A written personalized care plan for preventive services as well as general preventive health recommendations were provided to patient.  See attached scanned questionnaire for additional information.   Signed,   Lindell Noe, MHA, BS, LPN Health Advisor

## 2015-09-12 NOTE — Patient Instructions (Signed)
Mr. Manuel , Thank you for taking time to come for your Medicare Wellness Visit. I appreciate your ongoing commitment to your health goals. Please review the following plan we discussed and let me know if I can assist you in the future.   These are the goals we discussed: Goals    . Increase physical activity          Starting 09/12/2015, I will continue to exercise for at least 30 min on recumbent bike and to do at least 10-15 min of bed exercises daily.        This is a list of the screening recommended for you and due dates:  Health Maintenance  Topic Date Due  . Flu Shot  01/22/2016*  . Tetanus Vaccine  12/19/2020  . Shingles Vaccine  Completed  . Pneumonia vaccines  Completed  *Topic was postponed. The date shown is not the original due date.   Preventive Care for Adults  A healthy lifestyle and preventive care can promote health and wellness. Preventive health guidelines for adults include the following key practices.  . A routine yearly physical is a good way to check with your health care provider about your health and preventive screening. It is a chance to share any concerns and updates on your health and to receive a thorough exam.  . Visit your dentist for a routine exam and preventive care every 6 months. Brush your teeth twice a day and floss once a day. Good oral hygiene prevents tooth decay and gum disease.  . The frequency of eye exams is based on your age, health, family medical history, use  of contact lenses, and other factors. Follow your health care provider's ecommendations for frequency of eye exams.  . Eat a healthy diet. Foods like vegetables, fruits, whole grains, low-fat dairy products, and lean protein foods contain the nutrients you need without too many calories. Decrease your intake of foods high in solid fats, added sugars, and salt. Eat the right amount of calories for you. Get information about a proper diet from your health care provider, if  necessary.  . Regular physical exercise is one of the most important things you can do for your health. Most adults should get at least 150 minutes of moderate-intensity exercise (any activity that increases your heart rate and causes you to sweat) each week. In addition, most adults need muscle-strengthening exercises on 2 or more days a week.  Silver Sneakers may be a benefit available to you. To determine eligibility, you may visit the website: www.silversneakers.com or contact program at [phone] Mon-Fri between 8AM-8PM.   . Maintain a healthy weight. The body mass index (BMI) is a screening tool to identify possible weight problems. It provides an estimate of body fat based on height and weight. Your health care provider can find your BMI and can help you achieve or maintain a healthy weight.   For adults 20 years and older: ? A BMI below 18.5 is considered underweight. ? A BMI of 18.5 to 24.9 is normal. ? A BMI of 25 to 29.9 is considered overweight. ? A BMI of 30 and above is considered obese.   . Maintain normal blood lipids and cholesterol levels by exercising and minimizing your intake of saturated fat. Eat a balanced diet with plenty of fruit and vegetables. Blood tests for lipids and cholesterol should begin at age 58 and be repeated every 5 years. If your lipid or cholesterol levels are high, you are over 50, or  you are at high risk for heart disease, you may need your cholesterol levels checked more frequently. Ongoing high lipid and cholesterol levels should be treated with medicines if diet and exercise are not working.  . If you smoke, find out from your health care provider how to quit. If you do not use tobacco, please do not start.  . If you choose to drink alcohol, please do not consume more than 2 drinks per day. One drink is considered to be 12 ounces (355 mL) of beer, 5 ounces (148 mL) of wine, or 1.5 ounces (44 mL) of liquor.  . If you are 75-19 years old, ask your  health care provider if you should take aspirin to prevent strokes.  . Use sunscreen. Apply sunscreen liberally and repeatedly throughout the day. You should seek shade when your shadow is shorter than you. Protect yourself by wearing long sleeves, pants, a wide-brimmed hat, and sunglasses year round, whenever you are outdoors.  . Once a month, do a whole body skin exam, using a mirror to look at the skin on your back. Tell your health care provider of new moles, moles that have irregular borders, moles that are larger than a pencil eraser, or moles that have changed in shape or color.

## 2015-09-12 NOTE — Progress Notes (Signed)
Pre visit review using our clinic review tool, if applicable. No additional management support is needed unless otherwise documented below in the visit note. 

## 2015-09-12 NOTE — Progress Notes (Signed)
Dr. Frederico Hamman T. Shervon Kerwin, MD, [facility] [place], [address] Phone: [phone] Fax: [phone]  09/12/2015  Patient: Andrew [name], MRN: [id number], DOB: 10-18-1923, 82 y.o.  Primary Physician:  [name], MD   Chief Complaint  Patient presents with  . Follow-up    2 month  . Medicare Wellness   Subjective:   ROEMELLO SPEYER is a 82 y.o. pleasant patient who presents with the following:  Preventative Health Maintenance Visit:  Health Maintenance Summary Reviewed and updated, unless pt declines services.  Tobacco History Reviewed. Alcohol: No concerns, no excessive use Exercise Habits: rehab only STD concerns: no risk or activity to increase risk Drug Use: None Encouraged self-testicular check  L leg is still having some weakness, but the arm is doing ok.  Initially he did have some with the l arm. Leg never has gotten back to baseline.   The patient had a stroke 3 months ago, but he is still frustrated in that he has not come back to baseline, he has some balance disturbance, and some weakness in his left leg.  Health Maintenance  Topic Date Due  . INFLUENZA VACCINE  01/22/2016 (Originally 08/23/2015)  . TETANUS/TDAP  12/19/2020  . ZOSTAVAX  Completed  . PNA vac Low Risk Adult  Completed   Immunization History  Administered Date(s) Administered  . Influenza Split 10/26/2010, 10/12/2012  . Influenza Whole 11/03/2008, 10/07/2009  . Influenza, High Dose Seasonal PF 10/07/2013, 10/19/2014  . Pneumococcal Conjugate-13 04/28/2014  . Pneumococcal Polysaccharide-23 09/12/2015  . Tdap 12/20/2010  . Zoster 01/22/2006   Patient Active Problem List   Diagnosis Date Noted  . Right-sided cerebrovascular accident (CVA) [facility])     Priority: High  . Chronic diastolic CHF (congestive heart failure) ([place]) 08/19/2012    Priority: High  . PVD- moderate carotid disease 03/07/2010    Priority: High  . Angina,  class II ([place]) 06/18/2008    Priority: High  . Hx of CABG 06/18/2008    Priority: High  . Moderate aortic stenosis 06/18/2008    Priority: High  . Chronic renal insufficiency, stage III (moderate) 06/02/2014    Priority: Medium  . Macular degeneration (senile) of retina 04/29/2014    Priority: Medium  . TIA (transient ischemic attack) 06/03/2013    Priority: Medium  . COPD, minimal-mild 12/22/2010    Priority: Medium  . Spinal stenosis, lumbar region, with neurogenic claudication 07/13/2009    Priority: Medium  . Essential hypertension   . Alcohol abuse   . Thrombocytopenia ([place])   . 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 Major depressive disorder, single episode 06/11/2013  . Pancreatic cyst 01/29/2013  . OSA (obstructive sleep apnea) 12/26/2012  . S/P excision of acoustic neuroma 06/18/2010  . Pulmonary nodule 06/17/2010  . Vertigo 06/14/2010  . THYROID NODULE 03/13/2010  . ALLERGIC RHINITIS 09/01/2008  . COLONIC POLYPS, HX OF 09/01/2008  . HYPERCHOLESTEROLEMIA 06/18/2008  . GASTROESOPHAGEAL REFLUX DISEASE 06/18/2008   Past Medical History:  Diagnosis Date  . Allergic rhinitis   . AORTIC STENOSIS   . CAD, ARTERY BYPASS GRAFT   . CAROTID ARTERY STENOSIS 03/07/2010   80%  . CHF (congestive heart failure) ([place])   . COPD, mild ([place]) 12/22/2010  . GASTROESOPHAGEAL REFLUX DISEASE   . History of colonic polyps    1999, 2004  . History of prostate cancer 2002   s/p treatment with seeds / radiation  . HYPERCHOLESTEROLEMIA   . Lumbar spinal stenosis  07/13/2009  . Macular degeneration (senile) of retina 04/29/2014  . Pancreatic cyst 01/29/2013   Noted on MRI scan from [facility] on 07/27/2011.  I reviewed report at patient request on 01-29-2013.  "Small cystic focus in the posterior pancreatic head.  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  Follow up MRI is advised. The main pancreatic  duct remains normal in appearance."  Patient do  . S/P excision of acoustic neuroma   . Skin cancer   . Stroke ([place])   . Thyroid nodule 05/2010   Abnormal biopsy, 82 year old patient and his wife have made informed decision to not pursue surgical resection. Potential risk including cancer has been thoroughly discussed with the patient.   Past Surgical History:  Procedure Laterality Date  . CATARACT EXTRACTION    . CATARACT EXTRACTION  2010  . CHOLECYSTECTOMY    . CORONARY ARTERY BYPASS GRAFT    . CRANIECTOMY FOR EXCISION OF ACOUSTIC NEUROMA  1985  . EYE SURGERY    . SHOULDER SURGERY    . TONSILLECTOMY AND ADENOIDECTOMY  1932  . TOTAL KNEE ARTHROPLASTY    . transperineal implatation of palladium     Social History   Social History  . Marital status: Married    Spouse name: N/A  . Number of children: N/A  . Years of education: N/A   Occupational History  . retired  At [employer]    and [employer] Topics  . Smoking status: Former Smoker    Packs/day: 2.00    Years: 20.00    Types: Cigarettes    Quit date: 01/22/1961  . Smokeless tobacco: Never Used  . Alcohol use 4.2 oz/week    7 Shots of liquor per week     Comment: 1 drink nightly  . Drug use: No  . Sexual activity: No   Other Topics Concern  . Not on file   Social History Narrative  . No narrative on file   Family History  Problem Relation Age of Onset  . Emphysema Brother   . Lung cancer Brother   . Heart disease Father   . Hypertension Father   . Heart attack Father   . Colon cancer Sister   . Non-Hodgkin's lymphoma Sister   . Heart disease Brother   . Colon cancer Sister   . Skin cancer Sister   . Alcohol abuse    . Arthritis    . Cancer    . Macular degeneration    . Lung cancer Daughter    No Known Allergies  Medication list has been reviewed and updated.   General: Denies fever, chills, sweats. No significant weight loss. Eyes: Denies blurring,significant  itching ENT: Denies earache, sore throat, and hoarseness. Cardiovascular: Denies chest pains, palpitations, dyspnea on exertion Respiratory: Denies cough, dyspnea at rest,wheeezing Breast: no concerns about lumps GI: Denies nausea, vomiting, diarrhea, constipation, change in bowel habits, abdominal pain, melena, hematochezia GU: Denies penile discharge, ED, urinary flow / outflow problems. No STD concerns. Musculoskeletal: Denies back pain, joint pain Derm: Denies rash, itching Neuro: S/P CVA WITH LEFT SIDED WEAKNESS AND ONGOING ISSUES WITH BALANCE Psych: Denies depression, anxiety Endocrine: Denies cold intolerance, heat intolerance, polydipsia Heme: Denies enlarged lymph nodes Allergy: No hayfever  Objective:   BP 132/68 (BP Location: Left Arm, Patient Position: Sitting, Cuff Size: Normal)   Pulse (!) 51   Temp 98.6  F (37 C) (Oral)   Ht 5' 8"  (1.727 m) Comment: shoes  Wt 192 lb 8 oz (87.3 kg)   SpO2 96%   BMI 29.27 kg/m  Ideal Body Weight: Weight in (lb) to have BMI = 25: 164.1  Hearing Screening Comments: Hearing aid in right ear; 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 Vision Screening Comments: Last vision exam approx March 2017 at [place]: well developed, well nourished, no acute distress Eyes: conjunctiva and lids normal, PERRLA, EOMI ENT: TM clear, nares clear, oral exam WNL Neck: supple, no lymphadenopathy, no thyromegaly, no JVD Pulm: clear to auscultation and percussion, respiratory effort normal CV: regular rate and rhythm, S1-S2, no murmur, rub or gallop, no bruits, peripheral pulses normal and symmetric, no cyanosis, clubbing, edema or varicosities GI: soft, non-tender; no hepatosplenomegaly, masses; active bowel sounds all quadrants GU: defer Lymph: no cervical, axillary or inguinal adenopathy MSK: gait normal, muscle tone and strength WNL, no joint swelling, effusions, discoloration, crepitus  SKIN: clear, good turgor, color WNL, no rashes, lesions,  or ulcerations Neuro: normal mental status, L SIDED BALANCE OFF AND WEAKNESS L LEG Psych: alert; oriented to person, place and time, normally interactive and not anxious or depressed in appearance. All labs reviewed with patient.  Lipids:    Component Value Date/Time   CHOL 115 06/21/2015 0538   TRIG 71 06/21/2015 0538   HDL 45 06/21/2015 0538   LDLDIRECT 67.0 03/13/2010 1453   VLDL 14 06/21/2015 0538   CHOLHDL 2.6 06/21/2015 0538   CBC: CBC Latest Ref Rng & Units 07/11/2015 06/27/2015 06/23/2015  WBC 4.0 - 10.5 K/uL 6.2 7.1 7.8  Hemoglobin 13.0 - 17.0 g/dL 14.7 13.9 14.6  Hematocrit 39.0 - 52.0 % 43.2 41.8 43.9  Platelets 150.0 - 400.0 K/uL 147.0(L) 110(L) 80(L)    Basic Metabolic Panel:    Component Value Date/Time   NA 137 07/11/2015 1447   K 4.4 07/11/2015 1447   CL 101 07/11/2015 1447   CO2 28 07/11/2015 1447   BUN 24 (H) 07/11/2015 1447   CREATININE 1.32 07/11/2015 1447   GLUCOSE 94 07/11/2015 1447   CALCIUM 8.9 07/11/2015 1447   Hepatic Function Latest Ref Rng & Units 07/11/2015 06/27/2015 06/20/2015  Total Protein 6.0 - 8.3 g/dL 6.8 6.0(L) 6.4(L)  Albumin 3.5 - 5.2 g/dL 3.9 3.1(L) 3.6  AST 0 - 37 U/L 36 41 46(H)  ALT 0 - 53 U/L 34 43 43  Alk Phosphatase 39 - 117 U/L 109 64 68  Total Bilirubin 0.2 - 1.2 mg/dL 0.7 1.3(H) 1.5(H)  Bilirubin, Direct 0.0 - 0.3 mg/dL 0.2 - -    Lab Results  Component Value Date   TSH 1.15 10/28/2014   Lab Results  Component Value Date   PSA refused 01/03/2009    Assessment and Plan:   Healthcare maintenance  hhe is frustrated by his recovery from his stroke.  He is only 3 months out.  Tried to encourage him.  He also iis almost 82 years old, so it is normal for some of his systems to be breaking down at this point.  Health Maintenance Exam: The patient's preventative maintenance and recommended screening tests for an annual wellness exam were reviewed in full today. Brought up to date unless services declined.  Counselled on the  importance of diet, exercise, and its role in overall health and mortality. The patient's FH and SH was reviewed, including their home life, tobacco status, and drug and alcohol status.  Follow-up: 6 mo  Signed,  Maud Deed. Dewitt Judice, MD   Patient's Medications  New Prescriptions   No medications on file  Previous Medications   ACETAMINOPHEN (TYLENOL) 500 MG TABLET    Take 500 mg by mouth every 6 (six) hours as needed (pain).   ALBUTEROL (PROVENTIL HFA;VENTOLIN HFA) 108 (90 BASE) MCG/ACT INHALER    Inhale 2 puffs into the lungs every 6 (six) hours as needed for wheezing or shortness of breath.   ASPIRIN 325 MG TABLET    Take 325 mg by mouth daily.   B COMPLEX-C-FOLIC ACID (NEPHRO-VITE PO)    Take 1 tablet by mouth daily.     CHOLECALCIFEROL (VITAMIN D) 1000 UNITS TABLET    Take 1,000 Units by mouth daily.   FLUOXETINE (PROZAC) 10 MG TABLET    Take 1 tablet (10 mg total) by mouth daily.   FUROSEMIDE (LASIX) 40 MG TABLET    Take as directed   ISOSORBIDE MONONITRATE (IMDUR) 60 MG 24 HR TABLET    Take 90 mg by mouth daily.   METOPROLOL SUCCINATE (TOPROL-XL) 25 MG 24 HR TABLET    Take 25 mg by mouth daily.   MULTIPLE VITAMINS-MINERALS (PRESERVISION/LUTEIN) CAPS    Take 1 capsule by mouth 2 (two) times daily.   NITROGLYCERIN (NITROLINGUAL) 0.4 MG/SPRAY SPRAY    PLACE 1 SPRAY UNDER THE TONGUE EVERY 5 (FIVE) MINUTES AS NEEDED.   OVER THE COUNTER MEDICATION    Place 1 drop into both eyes daily as needed (dry eyes). OTC lubricating eye drop   POTASSIUM CHLORIDE (K-DUR) 10 MEQ TABLET    Take 2 tablets (20 mEq total) by mouth daily.   PRAVASTATIN (PRAVACHOL) 40 MG TABLET    Take 1 tablet (40 mg total) by mouth daily.  Modified Medications   No medications on file  Discontinued Medications   CHOLECALCIFEROL (VITAMIN D) 400 UNITS TABS TABLET    Take 1,000 Units by mouth daily.

## 2015-09-13 NOTE — Progress Notes (Signed)
I reviewed health advisor's note, was available for consultation, and agree with documentation and plan.   Signed,  Darrly Loberg T. Yarissa Reining, MD  

## 2015-10-27 ENCOUNTER — Ambulatory Visit: Payer: Medicare Other | Admitting: Family Medicine

## 2015-10-27 ENCOUNTER — Encounter: Payer: Self-pay | Admitting: Family Medicine

## 2015-10-27 VITALS — BP 120/80 | HR 55 | Wt 187.8 lb

## 2015-10-27 DIAGNOSIS — S20212A Contusion of left front wall of thorax, initial encounter: Secondary | ICD-10-CM | POA: Diagnosis not present

## 2015-10-27 NOTE — Progress Notes (Signed)
Subjective:    Patient ID: Andrew [name], male    DOB: February 12, 1923, 82 y.o.   MRN: [id number]  HPI This is a 82 yo male, accompanied by his wife, who presents today following a fall that occurred last night. He was sitting in a chair that swivels and possibly fell asleep, awakening abruptly to urinate, must have fallen out of the chair. The chair fell on top of him. He called to his wife and was able to get up using his walker. A little while later he had a great deal of pain on his left ribcage. No headache or loc. No new back pain. Took Alleve x 1 last night and was very uncomfortable. Pain with cough and deep breath. Has less pain this morning. No chest pain, no SOB. Has been making good progress with rehab following stroke. Is riding stationary bike and doing leg machine most days. Daughter just sent them a chair stretching DVD.   Past Medical History:  Diagnosis Date  . Allergic rhinitis   . AORTIC STENOSIS   . CAD, ARTERY BYPASS GRAFT   . CAROTID ARTERY STENOSIS 03/07/2010   80%  . CHF (congestive heart failure) ([place])   . COPD, mild ([place]) 12/22/2010  . GASTROESOPHAGEAL REFLUX DISEASE   . History of colonic polyps    1999, 2004  . History of prostate cancer 2002   s/p treatment with seeds / radiation  . HYPERCHOLESTEROLEMIA   . Lumbar spinal stenosis 07/13/2009  . Macular degeneration (senile) of retina 04/29/2014  . Pancreatic cyst 01/29/2013   Noted on MRI scan from [facility] on 07/27/2011.  I reviewed report at patient request on 01-29-2013.  "Small cystic focus in the posterior pancreatic head.  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  Follow up MRI is advised. The main pancreatic duct remains normal in appearance."  Patient do  . S/P excision of acoustic neuroma   . Skin cancer   . Stroke ([place])   . Thyroid nodule 05/2010   Abnormal biopsy, 82 year old patient and his wife  have made informed decision to not pursue surgical resection. Potential risk including cancer has been thoroughly discussed with the patient.   Past Surgical History:  Procedure Laterality Date  . CATARACT EXTRACTION    . CATARACT EXTRACTION  2010  . CHOLECYSTECTOMY    . CORONARY ARTERY BYPASS GRAFT    . CRANIECTOMY FOR EXCISION OF ACOUSTIC NEUROMA  1985  . EYE SURGERY    . SHOULDER SURGERY    . TONSILLECTOMY AND ADENOIDECTOMY  1932  . TOTAL KNEE ARTHROPLASTY    . transperineal implatation of palladium     Family History  Problem Relation Age of Onset  . Emphysema Brother   . Lung cancer Brother   . Heart disease Father   . Hypertension Father   . Heart attack Father   . Colon cancer Sister   . Non-Hodgkin's lymphoma Sister   . Heart disease Brother   . Colon cancer Sister   . Skin cancer Sister   . Alcohol abuse    . Arthritis    . Cancer    . Macular degeneration    . Lung cancer Daughter    Social History  Substance Use Topics  . Smoking status: Former Smoker    Packs/day: 2.00    Years: 20.00    Types: Cigarettes    Quit date: 01/22/1961  .  Smokeless tobacco: Never Used  . Alcohol use 4.2 oz/week    7 Shots of liquor per week     Comment: 1 drink nightly      Review of Systems Per HPI    Objective:   Physical Exam  Constitutional: He is oriented to person, place, and time. He appears well-developed and well-nourished.  HENT:  Head: Normocephalic and atraumatic.  Eyes: Conjunctivae and EOM are normal.  Neck: Normal range of motion. Neck supple.  Cardiovascular: Normal rate, regular rhythm and normal heart sounds.   Pulmonary/Chest: Effort normal and breath sounds normal.    Musculoskeletal:       Cervical back: He exhibits no bony tenderness.       Thoracic back: He exhibits no bony tenderness.       Lumbar back: He exhibits no bony tenderness.  Neurological: He is alert and oriented to person, place, and time.  Skin: Skin is warm and dry.    Psychiatric: He has a normal mood and affect. His behavior is normal. Judgment and thought content normal.  Vitals reviewed.     BP 120/80   Pulse (!) 55   Wt 187 lb 12.8 oz (85.2 kg)   SpO2 95%   BMI 28.55 kg/m  Wt Readings from Last 3 Encounters:  10/27/15 187 lb 12.8 oz (85.2 kg)  09/12/15 192 lb 8 oz (87.3 kg)  09/12/15 192 lb 8 oz (87.3 kg)       Assessment & Plan:  1. Rib contusion, left, initial encounter - Provided written and verbal information regarding diagnosis and treatment. - reassured patient and his wife, no imaging indicated at this time - can continue Alleve 1 tablet BID for short period of time, can use acetaminophen as well - heat prn - instructed him to perform gentle ROM several times a day, encouraged him to add stretching exercises to exercise regimen, demonstrated bracing to use when coughing and instructed him to perform deep breathing several times a day to prevent atelectasis.  - RTC precautions reviwed   Clarene Reamer, FNP-BC  [facility]  10/28/2015 9:04 AM

## 2015-10-27 NOTE — Patient Instructions (Signed)
Continue Alleve one tablet every 12 hours as needed for pain for 5 to 7 days Can take a dose of tylenol in the middle of the day if needed Apply heat if that feels good Try to do all of your regular activities Do deep breathing (10 slow, deep breaths) several times a day  Rib Contusion A rib contusion is a deep bruise on your rib area. Contusions are the result of a blunt trauma that causes bleeding and injury to the tissues under the skin. A rib contusion may involve bruising of the ribs and of the skin and muscles in the area. The skin overlying the contusion may turn blue, purple, or yellow. Minor injuries will give you a painless contusion, but more severe contusions may stay painful and swollen for a few weeks. CAUSES  A contusion is usually caused by a blow, trauma, or direct force to an area of the body. This often occurs while playing contact sports. SYMPTOMS  Swelling and redness of the injured area.  Discoloration of the injured area.  Tenderness and soreness of the injured area.  Pain with or without movement. DIAGNOSIS  The diagnosis can be made by taking a medical history and performing a physical exam. An X-ray, CT scan, or MRI may be needed to determine if there were any associated injuries, such as broken bones (fractures) or internal injuries. TREATMENT  Often, the best treatment for a rib contusion is rest. Icing or applying cold compresses to the injured area may help reduce swelling and inflammation. Deep breathing exercises may be recommended to reduce the risk of partial lung collapse and pneumonia. Over-the-counter or prescription medicines may also be recommended for pain control. HOME CARE INSTRUCTIONS   Apply ice to the injured area:  Put ice in a plastic bag.  Place a towel between your skin and the bag.  Leave the ice on for 20 minutes, 2-3 times per day.  Take medicines only as directed by your health care provider.  Rest the injured area. Avoid  strenuous activity and any activities or movements that cause pain. Be careful during activities and avoid bumping the injured area.  Perform deep-breathing exercises as directed by your health care provider.  Do not lift anything that is heavier than 5 lb (2.3 kg) until your health care provider approves.  Do not use any tobacco products, including cigarettes, chewing tobacco, or electronic cigarettes. If you need help quitting, ask your health care provider. SEEK MEDICAL CARE IF:   You have increased bruising or swelling.  You have pain that is not controlled with treatment.  You have a fever. SEEK IMMEDIATE MEDICAL CARE IF:   You have difficulty breathing or shortness of breath.  You develop a continual cough, or you cough up thick or bloody sputum.  You feel sick to your stomach (nauseous), you throw up (vomit), or you have abdominal pain.   This information is not intended to replace advice given to you by your health care provider. Make sure you discuss any questions you have with your health care provider.   Document Released: 10/03/2000 Document Revised: 01/29/2014 Document Reviewed: 10/20/2013 Elsevier Interactive Patient Education [employer].

## 2015-10-30 ENCOUNTER — Other Ambulatory Visit: Payer: Self-pay | Admitting: Family Medicine

## 2015-10-31 ENCOUNTER — Other Ambulatory Visit: Payer: Medicare Other

## 2015-11-04 ENCOUNTER — Ambulatory Visit: Payer: Medicare Other | Admitting: Cardiology

## 2015-11-07 ENCOUNTER — Encounter: Payer: Medicare Other | Admitting: Family Medicine

## 2015-11-08 ENCOUNTER — Encounter: Payer: Self-pay | Admitting: Cardiology

## 2015-11-09 ENCOUNTER — Ambulatory Visit (INDEPENDENT_AMBULATORY_CARE_PROVIDER_SITE_OTHER): Payer: Medicare Other | Admitting: Cardiology

## 2015-11-09 ENCOUNTER — Encounter: Payer: Self-pay | Admitting: Cardiology

## 2015-11-09 VITALS — BP 128/68 | HR 55 | Ht 68.0 in | Wt 191.4 lb

## 2015-11-09 DIAGNOSIS — I350 Nonrheumatic aortic (valve) stenosis: Secondary | ICD-10-CM | POA: Diagnosis not present

## 2015-11-09 DIAGNOSIS — I209 Angina pectoris, unspecified: Secondary | ICD-10-CM | POA: Diagnosis not present

## 2015-11-09 DIAGNOSIS — I5032 Chronic diastolic (congestive) heart failure: Secondary | ICD-10-CM

## 2015-11-09 DIAGNOSIS — I6523 Occlusion and stenosis of bilateral carotid arteries: Secondary | ICD-10-CM

## 2015-11-09 MED ORDER — ISOSORBIDE MONONITRATE ER 120 MG PO TB24: 120.0000 mg | ORAL_TABLET | Freq: Every day | ORAL | 1 refills | Status: DC

## 2015-11-09 MED ORDER — POTASSIUM CHLORIDE ER 10 MEQ PO TBCR: 20.0000 meq | EXTENDED_RELEASE_TABLET | Freq: Every day | ORAL | 1 refills | Status: DC

## 2015-11-09 MED ORDER — CLOPIDOGREL BISULFATE 75 MG PO TABS: 75.0000 mg | ORAL_TABLET | Freq: Every day | ORAL | 1 refills | Status: DC

## 2015-11-09 NOTE — Patient Instructions (Signed)
Medication Instructions:  Decrease aspirin to 81mg  daily. Start Plavix 75mg  daily.  Increase Imdur to 120mg  daily-you can take 2 of your 60 mg tablets daily at the same time and use your current supply.  Labwork: None   Testing/Procedures: None   Follow-Up: Your physician wants you to follow-up in: 4 months with Dr End. (February 2018).  You will receive a reminder letter in the mail two months in advance. If you don't receive a letter, please call our office to schedule the follow-up appointment.       If you need a refill on your cardiac medications before your next appointment, please call your pharmacy.

## 2015-11-10 NOTE — Progress Notes (Signed)
Patient ID: Andrew [name], male   DOB: [birth year], 82 y.o.   MRN: [id number] PCP: Dr [name]  82 yo with history of CAD s/p CABG, CKD, CVA, and moderate aortic stenosis returns for followup.   He has had a history of chronic stable angina for years.  In 12/14, he had a Lexiscan Cardiolite which showed EF 42% with medium-sized scar in the lateral wall along with mild peri-infarct ischemia.  This was deemed a low risk study. I started the patient on ranolazine, which seemed to help his angina, but this was subsequently stopped by nephrology.  Therefore, I increased his Imdur, he is now taking 90 mg daily.   In 5/17, he was admitted with right internal capsule CVA and left-sided weakness.  Carotids showed stable [id number] LICA stenosis and echo showed EF 50-55% with moderate AS and moderate AI.  He did not have a monitor implanted for atrial fibrillation screening.   In 8/17, he was diagnosed with an ischemic event affecting the right eye, apparently embolic.  This was diagnosed by his ophthalmologist and I do not have these records.    Walking with walker, still with mild residual left-sided weakness. Chest pain with heavier exertion.  Uses NTG spray every few days. No change to long-term chronic pattern.  Dyspnea after walking 100-200 feet, this is chronic.  No orthopnea/PND.  No palpitations.    ECG: NSR, IVCD 124 msec, inferior TWIs  Labs (11/13): K 4.4, creatinine 1.3, LDL 66, HDL 53 Labs (7/14): BNP 590 Labs (8/14): K 4.4, creatinine 1.4, BNP 218, LDL 53, HDL 43 Labs (11/14): K 4.1, creatinine 1.3 Labs (1/15): Creatinine 1.7 Labs (2/15): K 3.9, creatinine 1.4 Labs (3/15): K 4.1, creatinine 1.6 Labs (5/15): K 4.6, creatinine 1.3, LDL 50, HDL 45 Labs (10/16): K 4.4, creatinine 1.39, TSH normal, LDL 57, HDL 51 Labs (6/17): K 4.4, creatinine 1.32, HCT 43.2, LDL 56, HDL 45  PMH: 1. CAD: s/p CABG in 1991 with LIMA-LAD, SVG-PDA, seq SVG to OM 1,2,3.  [place] [id number] complicated by cholesterol atheroemboli  and AKI.  One limb of seq SVG-OM 1,2,3 known to be occluded.  Chronic stable angina.  Lexiscan Cardiolite (12/14) with EF 42%, inferolateral hypokinesis, medium-sized scar in the lateral wall with mild peri-infarct ischemia, overall low risk.  2. Carotid stenosis: 7/13 carotid dopplers 40-59% on right, 60-79% on left.  Carotids (1/14) with [id number] RICA, A999333 LICA.  Carotids (0000000) with A999333 LICA, 123456 RICA.  Possible TIA in 4/15. Carotids ([id number]) with A999333 LICA, 123456 RICA.  Carotids ([id number]) with A999333 LICA.  Followed by [facility].  - Carotids ([id number]) with A999333 LICA stenosis.  3. GERD 4. Polyps 5. Thyroid nodule 6. S/p excision of acoustic neuroma 7. Prostate cancer treated with seed implantation in 2002 8. COPD: mild 9. Cholecystectomy 10. CKD 11. Aortic stenosis: Moderate.  Echo (2/14) with EF 50-55%, mild LVH, moderate AS with AVA 1.3 cm^2 and mean gradient 23 mmHg.  Echo (8/14) with EF 55-60%, moderate LVH, moderate AS with mean gradient 27 mmHg and AVA 1.03 cm^2, mild to moderate AI, mild MR.  Echo (8/15) with EF 50-55%, moderate AS with mean gradient 28/peak 45, mild AI, mild MR, severe LAE.  - Echo (5/17) with EF 50-55%, moderate focal basal septal hypertrophy, moderate AS (mean gradient 20 mmHg), moderate AI, PASP 46 mmHg.  12. Chronic diastolic CHF.  13. Macular degeneration 14. Mild to moderate OSA on sleep study. He is not on CPAP.  15. Depression 16. Migraines 17. Low back  pain/spinal stenosis  18. CVA: 5/17 small right internal capsule CVA with left-sided weakness.  - 0000000 embolic event to right eye.   SH: Retired Chief Financial Officer, quit smoking 1963, married, lives in [facility].  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   Current Outpatient Prescriptions  Medication Sig Dispense Refill  . acetaminophen (TYLENOL) 500 MG tablet Take 500 mg by mouth every 6 (six) hours as needed (pain).    Marland Kitchen albuterol (PROVENTIL HFA;VENTOLIN HFA) 108 (90 BASE) MCG/ACT  inhaler Inhale 2 puffs into the lungs every 6 (six) hours as needed for wheezing or shortness of breath. 1 Inhaler 3  . B Complex-C-Folic Acid (NEPHRO-VITE PO) Take 1 tablet by mouth daily.      . cholecalciferol (VITAMIN D) 1000 units tablet Take 1,000 Units by mouth daily.    [name] 10 MG tablet Take 1 tablet (10 mg total) by mouth daily. 90 tablet 1  . FLUoxetine (PROZAC) 10 MG tablet TAKE 1 TABLET DAILY 90 tablet 1  . furosemide (LASIX) 40 MG tablet Take as directed 135 tablet 3  . metoprolol succinate (TOPROL-XL) 25 MG 24 hr tablet Take 25 mg by mouth daily.    . Multiple Vitamins-Minerals (PRESERVISION/LUTEIN) CAPS Take 1 capsule by mouth 2 (two) times daily.    . nitroGLYCERIN (NITROLINGUAL) 0.4 MG/SPRAY spray PLACE 1 SPRAY UNDER THE TONGUE EVERY 5 (FIVE) MINUTES AS NEEDED. 4.9 g 12  . OVER THE COUNTER MEDICATION Place 1 drop into both eyes daily as needed (dry eyes). OTC lubricating eye drop    . potassium chloride (K-DUR) 10 MEQ tablet Take 2 tablets (20 mEq total) by mouth daily. 180 tablet 1  . pravastatin (PRAVACHOL) 40 MG tablet Take 1 tablet (40 mg total) by mouth daily. 30 tablet 1  . aspirin EC 81 MG tablet Take 1 tablet (81 mg total) by mouth daily.    . clopidogrel (PLAVIX) 75 MG tablet Take 1 tablet (75 mg total) by mouth daily. 90 tablet 1  . isosorbide mononitrate (IMDUR) 120 MG 24 hr tablet Take 1 tablet (120 mg total) by mouth daily. 90 tablet 1   No current facility-administered medications for this visit.     BP 128/68   Pulse (!) 55   Ht 5\' 8"  (1.727 m)   Wt 191 lb 6.4 oz (86.8 kg)   BMI 29.10 kg/m  General: NAD Neck: JVP 7 cm, no thyromegaly or thyroid nodule.  Lungs: CTAB  CV: Nondisplaced PMI.  Heart regular S1/S2, no S3/S4, 3/6 SEM RUSB with some obscuring of S2.  No edema.  Bilateral carotid bruits.  Normal pedal pulses.  Abdomen: Soft, nontender, no hepatosplenomegaly, no distention.  Skin: Intact without lesions or rashes.  Neurologic:  Alert and oriented x 3.  Psych: Normal affect. Extremities: No clubbing or cyanosis.   Assessment/Plan: 1. CAD: s/p CABG with long history of chronic stable angina.  He had a very difficult time with last cardiac cath (cholesterol atheroemboli and AKI) and wants cath only as last resort.  Lexiscan Cardiolite in 12/14 showed lateral MI with mild peri-infarct ischemia consistent with known occluded limb of sequential SVG-OMs (low risk).  He is now off ranolazine (told to stop by nephrology).  Symptoms are stable, uses NTG spray every few days.  - Continue Toprol XL and increase Imdur to 120 mg daily.  - Continue ASA and statin.  2. Chronic diastolic CHF: EF [id number] on 5/17 echo. NYHA class III symptoms, stable. Patient  looks euvolemic on current Lasix dose. - Can continue Lasix at 40 mg daily.  If he gains 2 lbs overnight or 3-4 lbs in a week, would take Lasix 40 qam/20 qpm for 3 days then back to 40 mg daily.     3. Hyperlipidemia: Good lipids 6/17.  4. CKD: Stable.    5. Aortic stenosis: Moderate on 5/17 echo.  If AS progresses to severe, he would not be a good surgical AVR candidate but may be a TAVR candidate.  6. Carotid stenosis: Followed by [facility]. Given recurrent embolic event (this time to right eye), will have him see [facility] (though the more stenosed carotid artery is the LICA).   7. OSA: Mild to moderate OSA on sleep study.  Not on CPAP at this point.   8. CVA: Right internal capsule CVA in 5/17 with residual left-sided weakness.  Neurology did not think this was cardio-embolic and LINQ monitor was not recommended.  However, patient had a recurrent event in 8/17, this time it was an embolic event to the right eye per the patient's report .  - Continue statin.  - Decrease ASA to 81 daily and add Plavix 75 daily.  - [facility] evaluation of carotid stenosis.    Followup in 4 months with Dr End given my transition to CHF clinic.   [name] 11/10/2015

## 2015-11-14 ENCOUNTER — Other Ambulatory Visit: Payer: Self-pay | Admitting: Cardiology

## 2015-11-14 NOTE — Telephone Encounter (Signed)
nitroGLYCERIN (NITROLINGUAL) 0.4 MG/SPRAY spray  Medication  Date: 07/15/2015 Department: [address] Office Ordering/Authorizing: Larey Dresser, MD  Order Providers   Prescribing Provider Encounter Provider  Larey Dresser, MD Larey Dresser, MD  Medication Detail    Disp Refills Start End   nitroGLYCERIN (NITROLINGUAL) 0.4 MG/SPRAY spray 4.9 g 12 07/15/2015    Sig: PLACE 1 SPRAY UNDER THE TONGUE EVERY 5 (FIVE) MINUTES AS NEEDED.   E-Prescribing Status: Receipt confirmed by pharmacy (07/15/2015 3:57 PM EDT)   Pharmacy   [facility] # [place], [place]

## 2015-11-15 ENCOUNTER — Encounter: Payer: Self-pay | Admitting: Family

## 2015-11-17 ENCOUNTER — Ambulatory Visit (HOSPITAL_COMMUNITY)
Admission: RE | Admit: 2015-11-17 | Discharge: 2015-11-17 | Disposition: A | Discharge status: Home / Self Care | Payer: Medicare Other | Source: Ambulatory Visit | Attending: Family | Admitting: Family

## 2015-11-17 ENCOUNTER — Ambulatory Visit (INDEPENDENT_AMBULATORY_CARE_PROVIDER_SITE_OTHER): Payer: Medicare Other | Admitting: Family

## 2015-11-17 ENCOUNTER — Encounter: Payer: Self-pay | Admitting: Family

## 2015-11-17 VITALS — BP 125/55 | HR 50 | Temp 97.0°F | Resp 14 | Ht 69.0 in | Wt 182.0 lb

## 2015-11-17 DIAGNOSIS — Z8673 Personal history of transient ischemic attack (TIA), and cerebral infarction without residual deficits: Secondary | ICD-10-CM

## 2015-11-17 DIAGNOSIS — I6523 Occlusion and stenosis of bilateral carotid arteries: Secondary | ICD-10-CM | POA: Diagnosis not present

## 2015-11-17 LAB — VAS US CAROTID
LCCADSYS: 44 cm/s
LCCAPDIAS: 8 cm/s
LICAPDIAS: 58 cm/s
Left CCA dist dias: 8 cm/s
Left CCA prox sys: 66 cm/s
Left ICA dist dias: -10 cm/s
Left ICA dist sys: -66 cm/s
Left ICA prox sys: 272 cm/s
RCCADSYS: -64 cm/s
RCCAPDIAS: 8 cm/s
RIGHT CCA MID DIAS: 10 cm/s
RIGHT ECA DIAS: 9 cm/s
Right CCA prox sys: 60 cm/s

## 2015-11-17 NOTE — Progress Notes (Signed)
Chief Complaint: Follow up Extracranial Carotid Artery Stenosis   History of Present Illness  Andrew [name] is a 82 y.o. male patient of [name] who returns for follow up of known extracranial carotid artery stenosis.  Patient has not had previous carotid artery intervention.  He did have several episodes of global amnesia, states [name] assured him this was not a stroke or TIA.  Wife states pt had a stroke in his right eye at the early part of this month, states this was determined on October 28, 2015 by Dr. George Ina (sp?), [place].   Pt states his left eye has lost most of the vision due to macular degeneration.   Dr. 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 Vascular risk factors of hypertension hyperlipidemia, age and sex. Mild memory difficulties due to age-related mild cognitive impairment.  This stroke manifested as mild left arm and leg weakness. He has received physical therapy and is exercising at home daily.   He fell backwards on stairs June 2016, was evaluated at an urgent care center. Wife states he has confirmed vertebral fracture of L3. Since this fall his right leg occasionally "feels like rubber".  Pt states he has spinal stenosis and receives injections in his back to help this, he does not seem to have claudication symptoms, denies non healing wounds. He avoids activity that brings on chest pain and has not had to use NTG as much. Dr. Aundra Dubin is his cardiologist. He "gets winded pretty quick". He states he has a hx of a mild MI.   He reports left side facial droop since left acoustic neuroma excised in 1985.  He uses a recumbent bike daily for 20 minutes.   Pt Diabetic: No Pt smoker: former smoker, quit in 1963  Pt meds include: Statin : Yes ASA: Yes Other anticoagulants/antiplatelets: no    Past Medical  History:  Diagnosis Date  . Allergic rhinitis   . AORTIC STENOSIS   . CAD, ARTERY BYPASS GRAFT   . CAROTID ARTERY STENOSIS 03/07/2010   80%  . CHF (congestive heart failure) ([place])   . COPD, mild ([place]) 12/22/2010  . GASTROESOPHAGEAL REFLUX DISEASE   . History of colonic polyps    1999, 2004  . History of prostate cancer 2002   s/p treatment with seeds / radiation  . HYPERCHOLESTEROLEMIA   . Lumbar spinal stenosis 07/13/2009  . Macular degeneration (senile) of retina 04/29/2014  . Pancreatic cyst 01/29/2013   Noted on MRI scan from [facility] on 07/27/2011.  I reviewed report at patient request on 01-29-2013.  "Small cystic focus in the posterior pancreatic head.  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  Follow up MRI is advised. The main pancreatic duct remains normal in appearance."  Patient do  . S/P excision of acoustic neuroma   . Skin cancer   . Stroke ([place])   . Thyroid nodule 05/2010   Abnormal biopsy, 82 year old patient and his wife have made informed decision to not pursue surgical resection. Potential risk including cancer has been thoroughly discussed with the patient.    Social History Social History  Substance Use Topics  . Smoking status: Former Smoker    Packs/day: 2.00    Years: 20.00    Types: Cigarettes  Quit date: 01/22/1961  . Smokeless tobacco: Never Used  . Alcohol use 4.2 oz/week    7 Shots of liquor per week     Comment: 1 drink nightly    Family History Family History  Problem Relation Age of Onset  . Emphysema Brother   . Lung cancer Brother   . Heart disease Father   . Hypertension Father   . Heart attack Father   . Colon cancer Sister   . Non-Hodgkin's lymphoma Sister   . Heart disease Brother   . Colon cancer Sister   . Skin cancer Sister   . Alcohol abuse    . Arthritis    . Cancer    . Macular degeneration    . Lung cancer Daughter      Surgical History Past Surgical History:  Procedure Laterality Date  . CATARACT EXTRACTION    . CATARACT EXTRACTION  2010  . CHOLECYSTECTOMY    . CORONARY ARTERY BYPASS GRAFT    . CRANIECTOMY FOR EXCISION OF ACOUSTIC NEUROMA  1985  . EYE SURGERY    . SHOULDER SURGERY    . TONSILLECTOMY AND ADENOIDECTOMY  1932  . TOTAL KNEE ARTHROPLASTY    . transperineal implatation of palladium      No Known Allergies  Current Outpatient Prescriptions  Medication Sig Dispense Refill  . acetaminophen (TYLENOL) 500 MG tablet Take 500 mg by mouth every 6 (six) hours as needed (pain).    Marland Kitchen albuterol (PROVENTIL HFA;VENTOLIN HFA) 108 (90 BASE) MCG/ACT inhaler Inhale 2 puffs into the lungs every 6 (six) hours as needed for wheezing or shortness of breath. 1 Inhaler 3  . aspirin EC 81 MG tablet Take 1 tablet (81 mg total) by mouth daily.    . B Complex-C-Folic Acid (NEPHRO-VITE PO) Take 1 tablet by mouth daily.      . cholecalciferol (VITAMIN D) 1000 units tablet Take 1,000 Units by mouth daily.    . clopidogrel (PLAVIX) 75 MG tablet Take 1 tablet (75 mg total) by mouth daily. 90 tablet 1  . FLUoxetine (PROZAC) 10 MG tablet TAKE 1 TABLET DAILY 90 tablet 1  . furosemide (LASIX) 40 MG tablet Take as directed 135 tablet 3  . isosorbide mononitrate (IMDUR) 120 MG 24 hr tablet Take 1 tablet (120 mg total) by mouth daily. 90 tablet 1  . metoprolol succinate (TOPROL-XL) 25 MG 24 hr tablet Take 25 mg by mouth daily.    . Multiple Vitamins-Minerals (PRESERVISION/LUTEIN) CAPS Take 1 capsule by mouth 2 (two) times daily.    . nitroGLYCERIN (NITROLINGUAL) 0.4 MG/SPRAY spray PLACE 1 SPRAY UNDER THE TONGUE EVERY 5 (FIVE) MINUTES AS NEEDED. 4.9 g 12  . OVER THE COUNTER MEDICATION Place 1 drop into both eyes daily as needed (dry eyes). OTC lubricating eye drop    . potassium chloride (K-DUR) 10 MEQ tablet Take 2 tablets (20 mEq total) by mouth daily. 180 tablet 1  . pravastatin (PRAVACHOL) 40 MG tablet Take 1 tablet  (40 mg total) by mouth daily. 30 tablet 1   No current facility-administered medications for this visit.     Review of Systems : See HPI for pertinent positives and negatives.  Physical Examination  Vitals:   11/17/15 1425 11/17/15 1430  BP: (!) 130/56 (!) 125/55  Pulse: (!) 52 (!) 50  Resp: 14   Temp: 97 F (36.1 C)   TempSrc: Oral   SpO2: 94%   Weight: 182 lb (82.6 kg)   Height: 5\' 9"  (1.753 m)  Body mass index is 26.88 kg/m.  General: WDWN male in NAD GAIT: using a cane Eyes: PERRLA Pulmonary: Respirations are slightly labored at rest, CTAB, adequate air movement.   Cardiac: regular rhythm,+ murmur.  VASCULAR EXAM Carotid Bruits Right Left   positive Positive   Aorta is not palpable. Radial pulses are 2+ palpable and equal.      LE Pulses Right Left   POPLITEAL not palpable  not palpable   POSTERIOR TIBIAL  palpable  palpable    DORSALIS PEDIS  ANTERIOR TIBIAL not palpable   palpable     Gastrointestinal: soft, nontender, BS WNL, no r/g,no palpable masses.  Musculoskeletal: No muscle atrophy/wasting. M/S 5/5 throughout, Extremities without ischemic changes.  Neurologic: A&O X 3; Appropriate Affect, Speech is normal CN 2-12 intact except is hard of hearing, Pain and light touch intact in extremities, Motor exam as listed above.    Assessment: Andrew [name] is a 82 y.o. 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 He has residual mild dragging of the left leg.  I discussed with Dr. Oneida Alar pt and wife report that his ophthalmologist told them that he had a stroke in his right eye, diagnosed early this month. Pt's reports left eye vision is diminished secondary to macular degeneration.  Pt reports left  side facial droop since left acoustic neuroma excised in 1985. Dr. Oneida Alar indicated that an ophthalmologist may see emboli on the retinal exam, which may or may not indicate that the pt had an occular stroke.  Since he has <40% stenosis in his right extracranial internal carotid artery, there is no intervention on his right extracranial internal carotid artery indicated that will lower his risk of a future TIA or stroke.  He has no new neurological symptoms that correspond to the left brain.   DATA Today's carotid Duplex suggests <40% right ICA stenosis and 60 - 79 % left ICA stenosis. No significant change compared to exam on 02/21/15.    Plan: Follow-up in 6 months with Carotid Duplex scan.   I discussed in depth with the patient the nature of atherosclerosis, and emphasized the importance of maximal medical management including strict control of blood pressure, blood glucose, and lipid levels, obtaining regular exercise, and continued cessation of smoking.  The patient is aware that without maximal medical management the underlying atherosclerotic disease process will progress, limiting the benefit of any interventions. The patient was given information about stroke prevention and what symptoms should prompt the patient to seek immediate medical care. Thank you for allowing [place] to participate in this patient's care.  Clemon Chambers, RN, MSN, FNP-C [facility] Office: [phone]  Clinic Physician: Oneida Alar  11/17/15 2:51 PM

## 2015-11-17 NOTE — Patient Instructions (Signed)
Stroke Prevention Some medical conditions and behaviors are associated with an increased chance of having a stroke. You may prevent a stroke by making healthy choices and managing medical conditions. HOW CAN I REDUCE MY RISK OF HAVING A STROKE?   Stay physically active. Get at least 30 minutes of activity on most or all days.  Do not smoke. It may also be helpful to avoid exposure to secondhand smoke.  Limit alcohol use. Moderate alcohol use is considered to be:  No more than 2 drinks per day for men.  No more than 1 drink per day for nonpregnant women.  Eat healthy foods. This involves:  Eating 5 or more servings of fruits and vegetables a day.  Making dietary changes that address high blood pressure (hypertension), high cholesterol, diabetes, or obesity.  Manage your cholesterol levels.  Making food choices that are high in fiber and low in saturated fat, trans fat, and cholesterol may control cholesterol levels.  Take any prescribed medicines to control cholesterol as directed by your health care provider.  Manage your diabetes.  Controlling your carbohydrate and sugar intake is recommended to manage diabetes.  Take any prescribed medicines to control diabetes as directed by your health care provider.  Control your hypertension.  Making food choices that are low in salt (sodium), saturated fat, trans fat, and cholesterol is recommended to manage hypertension.  Ask your health care provider if you need treatment to lower your blood pressure. Take any prescribed medicines to control hypertension as directed by your health care provider.  If you are 18-39 years of age, have your blood pressure checked every 3-5 years. If you are 40 years of age or older, have your blood pressure checked every year.  Maintain a healthy weight.  Reducing calorie intake and making food choices that are low in sodium, saturated fat, trans fat, and cholesterol are recommended to manage  weight.  Stop drug abuse.  Avoid taking birth control pills.  Talk to your health care provider about the risks of taking birth control pills if you are over 35 years old, smoke, get migraines, or have ever had a blood clot.  Get evaluated for sleep disorders (sleep apnea).  Talk to your health care provider about getting a sleep evaluation if you snore a lot or have excessive sleepiness.  Take medicines only as directed by your health care provider.  For some people, aspirin or blood thinners (anticoagulants) are helpful in reducing the risk of forming abnormal blood clots that can lead to stroke. If you have the irregular heart rhythm of atrial fibrillation, you should be on a blood thinner unless there is a good reason you cannot take them.  Understand all your medicine instructions.  Make sure that other conditions (such as anemia or atherosclerosis) are addressed. SEEK IMMEDIATE MEDICAL CARE IF:   You have sudden weakness or numbness of the face, arm, or leg, especially on one side of the body.  Your face or eyelid droops to one side.  You have sudden confusion.  You have trouble speaking (aphasia) or understanding.  You have sudden trouble seeing in one or both eyes.  You have sudden trouble walking.  You have dizziness.  You have a loss of balance or coordination.  You have a sudden, severe headache with no known cause.  You have new chest pain or an irregular heartbeat. Any of these symptoms may represent a serious problem that is an emergency. Do not wait to see if the symptoms will   go away. Get medical help at once. Call your local emergency services (911 in [place] Do not drive yourself to the hospital.   This information is not intended to replace advice given to you by your health care provider. Make sure you discuss any questions you have with your health care provider.   Document Released: 02/16/2004 Document Revised: 01/29/2014 Document Reviewed:  07/11/2012 Elsevier Interactive Patient Education 2016 Elsevier Inc.  

## 2015-11-23 ENCOUNTER — Ambulatory Visit (INDEPENDENT_AMBULATORY_CARE_PROVIDER_SITE_OTHER): Payer: Medicare Other | Admitting: Physical Medicine and Rehabilitation

## 2015-11-23 ENCOUNTER — Encounter (INDEPENDENT_AMBULATORY_CARE_PROVIDER_SITE_OTHER): Payer: Self-pay | Admitting: Physical Medicine and Rehabilitation

## 2015-11-23 VITALS — BP 120/51 | HR 60 | Temp 98.7°F

## 2015-11-23 DIAGNOSIS — M47816 Spondylosis without myelopathy or radiculopathy, lumbar region: Secondary | ICD-10-CM

## 2015-11-23 MED ORDER — METHYLPREDNISOLONE ACETATE 80 MG/ML IJ SUSP
80.0000 mg | Freq: Once | INTRAMUSCULAR | Status: AC
  Administered 2015-11-23: 80 mg

## 2015-11-23 MED ORDER — LIDOCAINE HCL (PF) 1 % IJ SOLN
0.3300 mL | Freq: Once | INTRAMUSCULAR | Status: AC
  Administered 2015-11-23: 0.3 mL

## 2015-11-23 NOTE — Procedures (Signed)
Lumbar Facet Joint Nerve Denervation  Patient: Andrew [name]      Date of Birth: 09/07/1923 MRN: [id number] PCP: [name], MD      Visit Date: 11/23/2015   Universal Protocol:    Date/Time: 11/01/174:06 PM  Consent Given By: the patient  Position: PRONE  Additional Comments: Vital signs were monitored before and after the procedure. Patient was prepped and draped in the usual sterile fashion. The correct patient, procedure, and site was verified.   Injection Procedure Details:  Procedure Site One Meds Administered:  Meds ordered this encounter  Medications  . lidocaine (PF) (XYLOCAINE) 1 % injection 0.3 mL  . methylPREDNISolone acetate (DEPO-MEDROL) injection 80 mg     Laterality: Right  Location/Site:  L4-L5 and L5-S1  Needle size: 18 G  Needle type: radiofrequency cannula  Needle Placement: Along juncture of superior articular process and transverse pocess  Findings:  -Contrast Used: N/A   -Comments: replacement given degenerative scoliosis  Procedure Details: For each desired target nerve, the corresponding transverse process (sacral ala for the L5 dorsal rami) was identified and the fluoroscope was positioned to square off the endplates of the corresponding vertebral body to achieve a true AP midline view.  The beam was then obliqued 15 to 20 degrees and caudally tilted 15 to 20 degrees to line up a trajectory along the target nerves. The skin over the target of the junction of superior articulating process and transverse process (sacral ala for the L5 dorsal rami) was infiltrated with 27ml of 1% Lidocaine without Epinephrine.  The 18 gauge 54mm active tip outer cannula was advanced in trajectory view to the target.  This procedure was repeated for each target nerve.  Then, for all levels, the outer cannula placement was fine-tuned and the position was then confirmed with bi-planar imaging.    Test stimulation was done both at sensory and motor levels to  ensure there was no radicular stimulation. The target tissues were then infiltrated with 1 ml of 1% Lidocaine without Epinephrine. Subsequently, a percutaneous neurotomy was carried out for 60 seconds at 80 degrees Celsius. The procedure was repeated with the cannula rotated 90 degrees, for duration of 60 seconds, one additional time at each level for a total of two lesions per level.  After the completion of the two lesions, 1 ml of injectate was delivered. It was then repeated for each facet joint nerve mentioned above. Appropriate radiographs were obtained to verify the probe placement during the neurotomy.   Additional Comments:  The patient tolerated the procedure well No complications occurred Dressing: Band-Aid    Post-procedure details: Patient was observed during the procedure. Post-procedure instructions were reviewed.  Patient left the clinic in stable condition.

## 2015-11-23 NOTE — Progress Notes (Signed)
Office Visit Note  Patient: Andrew [name]           Date of Birth: Feb 16, 1923           MRN: [id number] Visit Date: 11/23/2015              Requested by: [name], MD [place], [place] [address] PCP: [name], MD   Assessment & Plan: Visit Diagnoses:  1. Spondylosis without myelopathy or radiculopathy, lumbar region     Follow-Up Instructions: Return in about 10 days (around 12/03/2015) for radiofrequency ablation for left L4-5 and L5-S1.  Orders:  Orders Placed This Encounter  Procedures  . Radiofrequency,Lumbar    Meds ordered this encounter  Medications  . lidocaine (PF) (XYLOCAINE) 1 % injection 0.3 mL  . methylPREDNISolone acetate (DEPO-MEDROL) injection 80 mg       Lumbar Facet Joint Nerve Denervation  Patient: Andrew [name]                                                             Date of Birth: 26-May-1923 MRN: [id number] PCP: [name], MD                                                    Visit Date: 11/23/2015   Universal Protocol:    Date/Time: 11/01/174:06 PM  Consent Given By: the patient  Position: PRONE  Additional Comments: Vital signs were monitored before and after the procedure. Patient was prepped and draped in the usual sterile fashion. The correct patient, procedure, and site was verified.   Injection Procedure Details:  Procedure Site One Meds Administered:     Meds ordered this encounter  Medications  . lidocaine (PF) (XYLOCAINE) 1 % injection 0.3 mL  . methylPREDNISolone acetate (DEPO-MEDROL) injection 80 mg     Laterality: Right  Location/Site:  L4-L5 and L5-S1  Needle size: 18 G  Needle type: radiofrequency cannula  Needle Placement: Along juncture of superior articular process and transverse pocess  Findings:             -Contrast Used: N/A              -Comments: replacement given degenerative scoliosis  Procedure Details: For each desired target nerve, the  corresponding transverse process (sacral ala for the L5 dorsal rami) was identified and the fluoroscope was positioned to square off the endplates of the corresponding vertebral body to achieve a true AP midline view.  The beam was then obliqued 15 to 20 degrees and caudally tilted 15 to 20 degrees to line up a trajectory along the target nerves. The skin over the target of the junction of superior articulating process and transverse process (sacral ala for the L5 dorsal rami) was infiltrated with 4ml of 1% Lidocaine without Epinephrine.  The 18 gauge 17mm active tip outer cannula was advanced in trajectory view to the target.  This procedure was repeated for each target nerve.  Then, for all levels, the outer cannula placement was fine-tuned and the position was then confirmed with bi-planar imaging.    Test stimulation was done both at sensory and  motor levels to ensure there was no radicular stimulation. The target tissues were then infiltrated with 1 ml of 1% Lidocaine without Epinephrine. Subsequently, a percutaneous neurotomy was carried out for 60 seconds at 80 degrees Celsius. The procedure was repeated with the cannula rotated 90 degrees, for duration of 60 seconds, one additional time at each level for a total of two lesions per level.  After the completion of the two lesions, 1 ml of injectate was delivered. It was then repeated for each facet joint nerve mentioned above. Appropriate radiographs were obtained to verify the probe placement during the neurotomy.   Additional Comments:  The patient tolerated the procedure well No complications occurred Dressing: Band-Aid    Post-procedure details: Patient was observed during the procedure. Post-procedure instructions were reviewed.  Patient left the clinic in stable condition.          Other Procedures: No procedures performed   Clinical Data: No additional findings.   Subjective: Chief Complaint  Patient presents with    . Lower Back - Pain    HPI Mr. Desta is a pleasant 82 year old gentlemanho did well with last injection he had in Augusthich was facet joint blocks of the bilateral L4/5 and L5-S1 facet joints. Pain free x 2 weeks and then gradual increase in pain after. Pain across back and worse on right side. Worse with standing and walking long periods. Radiates down right leg to knee on occasion.he does not have any radicular pain or paresthesia. He does ambulate with a walker. He has had physical therapy as well as medication management without relief. Lease see her prior notes and are other electronic record for chest medication that we are going to complete radiofrequency ablation of the right L4-5 and L5-S1 facet joints.   Review of Systems   Objective: Vital Signs: BP (!) 120/51 (BP Location: Right Arm, Patient Position: Sitting, Cuff Size: Normal)   Pulse 60   Temp 98.7 F (37.1 C) (Oral)   SpO2 97%    Physical Exam General appearance: NAD, conversant  Psych: Appropriate affect, alert and oriented to person, place and time  Eyes: anicteric sclerae, moist conjunctivae; no lid-lag; PERRLA Lungs: normal respiratory effort and no intercostal retractions, no wheezing CVA: normal pulses Extremities: No peripheral edema  Skin: Normal temperature, turgor and texture; no rash, ulcers or subcutaneous nodules MSK:/Neuro:   On manual muscle testing there is 5/5 strength in the distal muscle groups of the lower extremities bilaterally without deficits. There is no clonus test bilaterally.   Ortho Exam  Specialty Comments:  No specialty comments available. Imaging: No results found.   PMFS History: Patient Active Problem List   Diagnosis Date Noted  . Essential hypertension   . Alcohol abuse   . Thrombocytopenia ([place])   . Right-sided cerebrovascular accident (CVA) ([place])   . Chronic renal insufficiency, stage III (moderate) 06/02/2014  . Macular degeneration (senile) of retina 04/29/2014  .  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 Major depressive disorder, single episode 06/11/2013  . TIA (transient ischemic attack) 06/03/2013  . Pancreatic cyst 01/29/2013  . OSA (obstructive sleep apnea) 12/26/2012  . Chronic diastolic CHF (congestive heart failure) ([place]) 08/19/2012  . COPD, minimal-mild 12/22/2010  . S/P excision of acoustic neuroma 06/18/2010  . Pulmonary nodule 06/17/2010  . Vertigo 06/14/2010  . THYROID NODULE 03/13/2010  . PVD- moderate carotid disease 03/07/2010  . Spinal stenosis, lumbar region, with neurogenic claudication 07/13/2009  . ALLERGIC RHINITIS 09/01/2008  . COLONIC POLYPS, HX OF 09/01/2008  . HYPERCHOLESTEROLEMIA  06/18/2008  . Angina, class II ([place]) 06/18/2008  . Hx of CABG 06/18/2008  . Moderate aortic stenosis 06/18/2008  . GASTROESOPHAGEAL REFLUX DISEASE 06/18/2008   Past Medical History:  Diagnosis Date  . Allergic rhinitis   . AORTIC STENOSIS   . CAD, ARTERY BYPASS GRAFT   . CAROTID ARTERY STENOSIS 03/07/2010   80%  . CHF (congestive heart failure) ([place])   . COPD, mild ([place]) 12/22/2010  . GASTROESOPHAGEAL REFLUX DISEASE   . History of colonic polyps    1999, 2004  . History of prostate cancer 2002   s/p treatment with seeds / radiation  . HYPERCHOLESTEROLEMIA   . Lumbar spinal stenosis 07/13/2009  . Macular degeneration (senile) of retina 04/29/2014  . Pancreatic cyst 01/29/2013   Noted on MRI scan from [facility] on 07/27/2011.  I reviewed report at patient request on 01-29-2013.  "Small cystic focus in the posterior pancreatic head.  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  Follow up MRI is advised. The main pancreatic duct remains normal in appearance."  Patient do  . S/P excision of acoustic neuroma   . Skin cancer   . Stroke ([place])   . Thyroid nodule 05/2010   Abnormal biopsy, 82 year old patient and his wife have made informed decision to  not pursue surgical resection. Potential risk including cancer has been thoroughly discussed with the patient.    Family History  Problem Relation Age of Onset  . Emphysema Brother   . Lung cancer Brother   . Heart disease Father   . Hypertension Father   . Heart attack Father   . Colon cancer Sister   . Non-Hodgkin's lymphoma Sister   . Heart disease Brother   . Colon cancer Sister   . Skin cancer Sister   . Alcohol abuse    . Arthritis    . Cancer    . Macular degeneration    . Lung cancer Daughter    Past Surgical History:  Procedure Laterality Date  . CATARACT EXTRACTION    . CATARACT EXTRACTION  2010  . CHOLECYSTECTOMY    . CORONARY ARTERY BYPASS GRAFT    . CRANIECTOMY FOR EXCISION OF ACOUSTIC NEUROMA  1985  . EYE SURGERY    . SHOULDER SURGERY    . TONSILLECTOMY AND ADENOIDECTOMY  1932  . TOTAL KNEE ARTHROPLASTY    . transperineal implatation of palladium     Social History   Occupational History  . retired  At [employer]    and [employer] Topics  . Smoking status: Former Smoker    Packs/day: 2.00    Years: 20.00    Types: Cigarettes    Quit date: 01/22/1961  . Smokeless tobacco: Never Used  . Alcohol use 4.2 oz/week    7 Shots of liquor per week     Comment: 1 drink nightly  . Drug use: No  . Sexual activity: No

## 2015-11-23 NOTE — Patient Instructions (Signed)

## 2015-12-05 ENCOUNTER — Ambulatory Visit (INDEPENDENT_AMBULATORY_CARE_PROVIDER_SITE_OTHER): Payer: Medicare Other | Admitting: Physical Medicine and Rehabilitation

## 2015-12-05 ENCOUNTER — Encounter (INDEPENDENT_AMBULATORY_CARE_PROVIDER_SITE_OTHER): Payer: Self-pay | Admitting: Physical Medicine and Rehabilitation

## 2015-12-05 VITALS — BP 131/52 | HR 72

## 2015-12-05 DIAGNOSIS — M545 Low back pain: Secondary | ICD-10-CM

## 2015-12-05 DIAGNOSIS — G8929 Other chronic pain: Secondary | ICD-10-CM | POA: Diagnosis not present

## 2015-12-05 DIAGNOSIS — M47816 Spondylosis without myelopathy or radiculopathy, lumbar region: Secondary | ICD-10-CM

## 2015-12-05 MED ORDER — METHYLPREDNISOLONE ACETATE 80 MG/ML IJ SUSP
80.0000 mg | Freq: Once | INTRAMUSCULAR | Status: AC
  Administered 2015-12-05: 80 mg

## 2015-12-05 MED ORDER — LIDOCAINE HCL (PF) 1 % IJ SOLN
0.3300 mL | Freq: Once | INTRAMUSCULAR | Status: AC
Start: 2015-12-05 — End: 2015-12-05
  Administered 2015-12-05: 0.3 mL

## 2015-12-05 NOTE — Progress Notes (Signed)
Andrew [name] - 82 y.o. male MRN [id number]  Date of birth: 1923-08-11  Office Visit Note: Visit Date: 12/05/2015 PCP: [name], MD Referred by: [name], MD  Subjective: Chief Complaint  Patient presents with  . Lower Back - Pain   HPI: Andrew [name] is a pleasant 82 year old gentleman who is present today with his wife who provides the history. We completed recent right-sided radiofrequency ablation of the L4-5 and L5-S1 facet joints with actually some ongoing benefit so far. Patient here today for planned left RFA. No change in symptoms on left side and states he has noticed some improvement on right side since the RFA on that side.    ROS Otherwise per HPI.  Assessment & Plan: Visit Diagnoses:  1. Spondylosis without myelopathy or radiculopathy, lumbar region   2. Chronic left-sided low back pain without sciatica     Plan: Findings:  I'm going to complete a planned left-sided radiofrequency ablation of the L4-5 and L5-S1 facet joint on the left. Please see our prior notes for further discussion and details.    Meds & Orders:  Meds ordered this encounter  Medications  . lidocaine (PF) (XYLOCAINE) 1 % injection 0.3 mL  . methylPREDNISolone acetate (DEPO-MEDROL) injection 80 mg    Orders Placed This Encounter  Procedures  . Radiofrequency,Lumbar    Follow-up: Return in about 4 weeks (around 01/02/2016) for Recheck spine after RFA.   Procedures: No procedures performed  Lumbar Facet Joint Nerve Denervation  Patient: Andrew [name]      Date of Birth: [birth year] MRN: [id number] PCP: [name], MD      Visit Date: 12/05/2015   Universal Protocol:    Date/Time: 11/14/175:53 AM  Consent Given By: the patient  Position: PRONE  Additional Comments: Vital signs were monitored before and after the procedure. Patient was prepped and draped in the usual sterile fashion. The correct patient, procedure, and site was verified.   Injection Procedure  Details:  Procedure Site One Meds Administered:  Meds ordered this encounter  Medications  . lidocaine (PF) (XYLOCAINE) 1 % injection 0.3 mL  . methylPREDNISolone acetate (DEPO-MEDROL) injection 80 mg     Laterality: Left  Location/Site:  L4-L5 L5-S1  Needle size: 18 G  Needle type: Radiofrequency cannula  Needle Placement: Along juncture of superior articular process and transverse pocess  Findings:  -Comments:  Procedure Details: For each desired target nerve, the corresponding transverse process (sacral ala for the L5 dorsal rami) was identified and the fluoroscope was positioned to square off the endplates of the corresponding vertebral body to achieve a true AP midline view.  The beam was then obliqued 15 to 20 degrees and caudally tilted 15 to 20 degrees to line up a trajectory along the target nerves. The skin over the target of the junction of superior articulating process and transverse process (sacral ala for the L5 dorsal rami) was infiltrated with 44ml of 1% Lidocaine without Epinephrine.  The 18 gauge 73mm active tip outer cannula was advanced in trajectory view to the target.  This procedure was repeated for each target nerve.  Then, for all levels, the outer cannula placement was fine-tuned and the position was then confirmed with bi-planar imaging.    Test stimulation was done both at sensory and motor levels to ensure there was no radicular stimulation. The target tissues were then infiltrated with 1 ml of 1% Lidocaine without Epinephrine. Subsequently, a percutaneous neurotomy was carried out for 60 seconds at 80 degrees Celsius.  The procedure was repeated with the cannula rotated 90 degrees, for duration of 60 seconds, one additional time at each level for a total of two lesions per level.  After the completion of the two lesions, 1 ml of injectate was delivered. It was then repeated for each facet joint nerve mentioned above. Appropriate radiographs were obtained to  verify the probe placement during the neurotomy.   Additional Comments:  The patient tolerated the procedure well Dressing: Band-Aid    Post-procedure details: Patient was observed during the procedure. Post-procedure instructions were reviewed.  Patient left the clinic in stable condition.        Clinical History: No specialty comments available.  He reports that he quit smoking about 54 years ago. His smoking use included Cigarettes. He has a 40.00 pack-year smoking history. He has never used smokeless tobacco.   Recent Labs  06/23/15 0525  HGBA1C 5.4    Objective:  VS:  HT:    WT:   BMI:     BP:(!) 131/52  HR:72bpm  TEMP: ( )  RESP:98 % Physical Exam  Musculoskeletal:  Patient does ambulate with a walker. He is slow to stand from a seated position with concordant pain with extension rotation of the lumbar spine. Good distal strength.    Ortho Exam Imaging: No results found.  Past Medical/Family/Surgical/Social History: Medications & Allergies reviewed per EMR Patient Active Problem List   Diagnosis Date Noted  . Essential hypertension   . Alcohol abuse   . Thrombocytopenia ([place])   . Right-sided cerebrovascular accident (CVA) ([place])   . Chronic renal insufficiency, stage III (moderate) 06/02/2014  . Macular degeneration (senile) of retina 04/29/2014  . 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 Major depressive disorder, single episode 06/11/2013  . TIA (transient ischemic attack) 06/03/2013  . Pancreatic cyst 01/29/2013  . OSA (obstructive sleep apnea) 12/26/2012  . Chronic diastolic CHF (congestive heart failure) ([place]) 08/19/2012  . COPD, minimal-mild 12/22/2010  . S/P excision of acoustic neuroma 06/18/2010  . Pulmonary nodule 06/17/2010  . Vertigo 06/14/2010  . THYROID NODULE 03/13/2010  . PVD- moderate carotid disease 03/07/2010  . Spinal stenosis, lumbar region, with neurogenic claudication 07/13/2009  . ALLERGIC RHINITIS 09/01/2008  . COLONIC POLYPS, HX OF  09/01/2008  . HYPERCHOLESTEROLEMIA 06/18/2008  . Angina, class II ([place]) 06/18/2008  . Hx of CABG 06/18/2008  . Moderate aortic stenosis 06/18/2008  . GASTROESOPHAGEAL REFLUX DISEASE 06/18/2008   Past Medical History:  Diagnosis Date  . Allergic rhinitis   . AORTIC STENOSIS   . CAD, ARTERY BYPASS GRAFT   . CAROTID ARTERY STENOSIS 03/07/2010   80%  . CHF (congestive heart failure) ([place])   . COPD, mild ([place]) 12/22/2010  . GASTROESOPHAGEAL REFLUX DISEASE   . History of colonic polyps    1999, 2004  . History of prostate cancer 2002   s/p treatment with seeds / radiation  . HYPERCHOLESTEROLEMIA   . Lumbar spinal stenosis 07/13/2009  . Macular degeneration (senile) of retina 04/29/2014  . Pancreatic cyst 01/29/2013   Noted on MRI scan from [facility] on 07/27/2011.  I reviewed report at patient request on 01-29-2013.  "Small cystic focus in the posterior pancreatic head.  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  Follow up MRI is advised. The main pancreatic duct remains normal in appearance."  Patient do  . S/P excision of acoustic neuroma   . Skin cancer   . Stroke (  [place])   . Thyroid nodule 05/2010   Abnormal biopsy, 82 year old patient and his wife have made informed decision to not pursue surgical resection. Potential risk including cancer has been thoroughly discussed with the patient.   Family History  Problem Relation Age of Onset  . Emphysema Brother   . Lung cancer Brother   . Heart disease Father   . Hypertension Father   . Heart attack Father   . Colon cancer Sister   . Non-Hodgkin's lymphoma Sister   . Heart disease Brother   . Colon cancer Sister   . Skin cancer Sister   . Alcohol abuse    . Arthritis    . Cancer    . Macular degeneration    . Lung cancer Daughter    Past Surgical History:  Procedure Laterality Date  . CATARACT EXTRACTION    . CATARACT  EXTRACTION  2010  . CHOLECYSTECTOMY    . CORONARY ARTERY BYPASS GRAFT    . CRANIECTOMY FOR EXCISION OF ACOUSTIC NEUROMA  1985  . EYE SURGERY    . SHOULDER SURGERY    . TONSILLECTOMY AND ADENOIDECTOMY  1932  . TOTAL KNEE ARTHROPLASTY    . transperineal implatation of palladium     Social History   Occupational History  . retired  At [employer]    and [employer] Topics  . Smoking status: Former Smoker    Packs/day: 2.00    Years: 20.00    Types: Cigarettes    Quit date: 01/22/1961  . Smokeless tobacco: Never Used  . Alcohol use 4.2 oz/week    7 Shots of liquor per week     Comment: 1 drink nightly  . Drug use: No  . Sexual activity: No

## 2015-12-05 NOTE — Patient Instructions (Signed)

## 2015-12-06 NOTE — Procedures (Signed)
Lumbar Facet Joint Nerve Denervation  Patient: Andrew [name]      Date of Birth: [birth year] MRN: [id number] PCP: [name], MD      Visit Date: 12/05/2015   Universal Protocol:    Date/Time: 11/14/175:53 AM  Consent Given By: the patient  Position: PRONE  Additional Comments: Vital signs were monitored before and after the procedure. Patient was prepped and draped in the usual sterile fashion. The correct patient, procedure, and site was verified.   Injection Procedure Details:  Procedure Site One Meds Administered:  Meds ordered this encounter  Medications  . lidocaine (PF) (XYLOCAINE) 1 % injection 0.3 mL  . methylPREDNISolone acetate (DEPO-MEDROL) injection 80 mg     Laterality: Left  Location/Site:  L4-L5 L5-S1  Needle size: 18 G  Needle type: Radiofrequency cannula  Needle Placement: Along juncture of superior articular process and transverse pocess  Findings:  -Comments:  Procedure Details: For each desired target nerve, the corresponding transverse process (sacral ala for the L5 dorsal rami) was identified and the fluoroscope was positioned to square off the endplates of the corresponding vertebral body to achieve a true AP midline view.  The beam was then obliqued 15 to 20 degrees and caudally tilted 15 to 20 degrees to line up a trajectory along the target nerves. The skin over the target of the junction of superior articulating process and transverse process (sacral ala for the L5 dorsal rami) was infiltrated with 27ml of 1% Lidocaine without Epinephrine.  The 18 gauge 69mm active tip outer cannula was advanced in trajectory view to the target.  This procedure was repeated for each target nerve.  Then, for all levels, the outer cannula placement was fine-tuned and the position was then confirmed with bi-planar imaging.    Test stimulation was done both at sensory and motor levels to ensure there was no radicular stimulation. The target tissues were then  infiltrated with 1 ml of 1% Lidocaine without Epinephrine. Subsequently, a percutaneous neurotomy was carried out for 60 seconds at 80 degrees Celsius. The procedure was repeated with the cannula rotated 90 degrees, for duration of 60 seconds, one additional time at each level for a total of two lesions per level.  After the completion of the two lesions, 1 ml of injectate was delivered. It was then repeated for each facet joint nerve mentioned above. Appropriate radiographs were obtained to verify the probe placement during the neurotomy.   Additional Comments:  The patient tolerated the procedure well Dressing: Band-Aid    Post-procedure details: Patient was observed during the procedure. Post-procedure instructions were reviewed.  Patient left the clinic in stable condition.

## 2015-12-19 ENCOUNTER — Encounter (HOSPITAL_COMMUNITY): Payer: Medicare Other

## 2015-12-19 ENCOUNTER — Ambulatory Visit: Payer: Medicare Other | Admitting: Family

## 2015-12-26 ENCOUNTER — Other Ambulatory Visit: Payer: Self-pay | Admitting: Dermatology

## 2016-01-03 ENCOUNTER — Ambulatory Visit (INDEPENDENT_AMBULATORY_CARE_PROVIDER_SITE_OTHER): Payer: Medicare Other | Admitting: Physical Medicine and Rehabilitation

## 2016-01-03 ENCOUNTER — Encounter (INDEPENDENT_AMBULATORY_CARE_PROVIDER_SITE_OTHER): Payer: Self-pay | Admitting: Physical Medicine and Rehabilitation

## 2016-01-03 VITALS — BP 118/58 | HR 53

## 2016-01-03 DIAGNOSIS — M545 Low back pain, unspecified: Secondary | ICD-10-CM

## 2016-01-03 DIAGNOSIS — G8929 Other chronic pain: Secondary | ICD-10-CM

## 2016-01-03 DIAGNOSIS — M47816 Spondylosis without myelopathy or radiculopathy, lumbar region: Secondary | ICD-10-CM

## 2016-01-03 MED ORDER — TRAMADOL HCL 50 MG PO TABS: ORAL_TABLET | ORAL | 0 refills | Status: DC

## 2016-01-03 NOTE — Progress Notes (Signed)
ROMAN RUETHER - 82 y.o. male MRN [id number]  Date of birth: 1923/09/15  Office Visit Note: Visit Date: 01/03/2016 PCP: [name], MD Referred by: [name], MD  Subjective: Chief Complaint  Patient presents with  . Lower Back - Pain   HPI: Mr. Greg is a 82 year old gentleman here for 1 month follow up from bilateral radiofrequency ablation of the bilateral L4-5 and L5-S1 facet joints. States he had only a few days of relief with both procedures. Had more relief and longer relief with right side than left. Pain in center of back and seems to be worse on right. "can't stand for any length of time"  Feels like legs will not hold him up when he stands after sitting for awhile. Ambulating with cane and walker at times. His history is complicated by internal capsule posterior limb stroke with some residual left-sided weakness. We did go over again today the fact that the ablation procedure take some time for the nerve to actually denervate and scar down if you will. We run into this issue a lot with patient's wanting it to be better right away and if we can somehow get rid of the nerve quickly would be beneficial but obviously does take a while. Nonetheless he is still having some back pain and cannot stand for any length of time. He does have stenosis at L3-4. His MRI is reviewed again with the patient and his wife who provides a lot of the history. We did ablate the lower joints due to his clinical symptoms. He does have facet arthropathy higher up as well L3-4. In fact that is where his stenosis really is between 3 and 4. He denies any leg pain but he does get some buttock pain. He has tried some medications in the past without good results. He is 82 years old with her some issue with stronger pain medications and potentially falling. He does take a small bit of Prozac and this was started after the stroke. The pain with standing is really limiting.      Review of Systems    Constitutional: Negative for chills, fever, malaise/fatigue and weight loss.  HENT: Negative for hearing loss and sinus pain.   Eyes: Negative for blurred vision, double vision and photophobia.  Respiratory: Negative for cough and shortness of breath.   Cardiovascular: Negative for chest pain, palpitations and leg swelling.  Gastrointestinal: Negative for abdominal pain, nausea and vomiting.  Genitourinary: Negative for flank pain.  Musculoskeletal: Positive for back pain and joint pain. Negative for myalgias.  Skin: Negative for itching and rash.  Neurological: Negative for tremors, focal weakness and weakness.  Endo/Heme/Allergies: Negative.   Psychiatric/Behavioral: Negative for depression.  All other systems reviewed and are negative.  Otherwise per HPI.  Assessment & Plan: Visit Diagnoses:  1. Spondylosis without myelopathy or radiculopathy, lumbar region   2. Chronic bilateral low back pain without sciatica     Plan: Findings:  Chronic history of low back and some buttock pain and in the past when I first started seeing him a few years ago he did have some radicular-type pain in the hip. He has an MRI showing some stenosis at L3-4 as well as facet arthropathy multiple level. We are still in the window for the radiofrequency ablation procedure to work. I'm not holding a lot of hope but statistically within a week or so he can still get some relief from that procedure. He did get good diagnostic relief of the medial branch  blocks with does make me think that at least part of this is facet mediated pain. I would however think that probably some of this is the stenosis. In the past unfortunately epidural injection was not very beneficial. There has been some time ago since we did an epidural injection. In terms of medication management I think he might do okay with a small bit of tramadol and I discussed that with the patient and his wife. I am going to prescribe 50 mg of tramadol to be  taken one half to one tablet as needed every 8 hours. I discussed using that sparingly just when he is to be functional or and is worse. It is worse in the mornings. Also counseled him on being active when he can be careful with not falling. He may also do well with something like duloxetine. I have used that an elderly patients and they've done pretty well. He is started on Prozac but he is just started this year. I would ask Dr. [name] to review that and possibly think about switching to duloxetine/Effexor to get more of a norepinephrine response. We are going to see how he does with a trial of the tramadol and giving the radiofrequency ablation another week or so to see if it does anything. At that point they're going to call my office and we will set him up for a bilateral L3 transforaminal epidural injection diagnostically and hopefully therapeutically.he is currently on Plavixwe would have to get him off the Plavix for the injection.  Although if need be the transforaminal injections can be done on anticoagulation in certain cases if we just cannot safely remove the anticoagulation    Meds & Orders:  Meds ordered this encounter  Medications  . traMADol (ULTRAM) 50 MG tablet    Sig: 1/2 to 1 tab by mouth Q 8 hours PRN pain    Dispense:  40 tablet    Refill:  0   No orders of the defined types were placed in this encounter.   Follow-up: No Follow-up on file.   Procedures: No procedures performed  No notes on file   Clinical History: L-spine MRI 03/25/2012 Multilevel facet arthropathy with moderate stenosis at L2-3, moderate to severe at L3-4 and mild to moderate at L4-5. Facet arthropathy worse at L4-5.  He reports that he quit smoking about 54 years ago. His smoking use included Cigarettes. He has a 40.00 pack-year smoking history. He has never used smokeless tobacco.   Recent Labs  06/23/15 0525  HGBA1C 5.4    Objective:  VS:  HT:    WT:   BMI:     BP:(!) 118/58  HR:(!) 53bpm   TEMP: ( )  RESP:  Physical Exam  Constitutional: He is oriented to person, place, and time. He appears well-developed and well-nourished. No distress.  HENT:  Head: Normocephalic and atraumatic.  Eyes: Conjunctivae are normal. Pupils are equal, round, and reactive to light.  Cardiovascular: Regular rhythm and intact distal pulses.   Pulmonary/Chest: Effort normal and breath sounds normal.  Musculoskeletal:  The patient's very slow to rise from a seated position. He does stand with a forward flexed spine. He does have pain with extension rotation of the lumbar spine. It seems to be a little better than in the past. He has no pain over the greater trochanters and no pain with hip rotation. He has good distal strength with some deficit on the left but mild. He has no real clonus bilaterally.  Neurological:  He is alert and oriented to person, place, and time.  Skin: Skin is warm.  Psychiatric: He has a normal mood and affect.    Ortho Exam Imaging: No results found.  Past Medical/Family/Surgical/Social History: Medications & Allergies reviewed per EMR Patient Active Problem List   Diagnosis Date Noted  . Essential hypertension   . Alcohol abuse   . Thrombocytopenia ([place])   . Right-sided cerebrovascular accident (CVA) ([place])   . Chronic renal insufficiency, stage III (moderate) 06/02/2014  . Macular degeneration (senile) of retina 04/29/2014  . 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 Major depressive disorder, single episode 06/11/2013  . TIA (transient ischemic attack) 06/03/2013  . Pancreatic cyst 01/29/2013  . OSA (obstructive sleep apnea) 12/26/2012  . Chronic diastolic CHF (congestive heart failure) ([place]) 08/19/2012  . COPD, minimal-mild 12/22/2010  . S/P excision of acoustic neuroma 06/18/2010  . Pulmonary nodule 06/17/2010  . Vertigo 06/14/2010  . THYROID NODULE 03/13/2010  . PVD- moderate carotid disease 03/07/2010  . Spinal stenosis, lumbar region, with neurogenic claudication  07/13/2009  . ALLERGIC RHINITIS 09/01/2008  . COLONIC POLYPS, HX OF 09/01/2008  . HYPERCHOLESTEROLEMIA 06/18/2008  . Angina, class II ([place]) 06/18/2008  . Hx of CABG 06/18/2008  . Moderate aortic stenosis 06/18/2008  . GASTROESOPHAGEAL REFLUX DISEASE 06/18/2008   Past Medical History:  Diagnosis Date  . Allergic rhinitis   . AORTIC STENOSIS   . CAD, ARTERY BYPASS GRAFT   . CAROTID ARTERY STENOSIS 03/07/2010   80%  . CHF (congestive heart failure) ([place])   . COPD, mild ([place]) 12/22/2010  . GASTROESOPHAGEAL REFLUX DISEASE   . History of colonic polyps    1999, 2004  . History of prostate cancer 2002   s/p treatment with seeds / radiation  . HYPERCHOLESTEROLEMIA   . Lumbar spinal stenosis 07/13/2009  . Macular degeneration (senile) of retina 04/29/2014  . Pancreatic cyst 01/29/2013   Noted on MRI scan from [facility] on 07/27/2011.  I reviewed report at patient request on 01-29-2013.  "Small cystic focus in the posterior pancreatic head.  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  Follow up MRI is advised. The main pancreatic duct remains normal in appearance."  Patient do  . S/P excision of acoustic neuroma   . Skin cancer   . Stroke ([place])   . Thyroid nodule 05/2010   Abnormal biopsy, 82 year old patient and his wife have made informed decision to not pursue surgical resection. Potential risk including cancer has been thoroughly discussed with the patient.   Family History  Problem Relation Age of Onset  . Emphysema Brother   . Lung cancer Brother   . Heart disease Father   . Hypertension Father   . Heart attack Father   . Colon cancer Sister   . Non-Hodgkin's lymphoma Sister   . Heart disease Brother   . Colon cancer Sister   . Skin cancer Sister   . Alcohol abuse    . Arthritis    . Cancer    . Macular degeneration    . Lung cancer Daughter    Past Surgical History:    Procedure Laterality Date  . CATARACT EXTRACTION    . CATARACT EXTRACTION  2010  . CHOLECYSTECTOMY    . CORONARY ARTERY BYPASS GRAFT    . CRANIECTOMY FOR EXCISION OF ACOUSTIC NEUROMA  1985  . EYE SURGERY    . SHOULDER SURGERY    .  TONSILLECTOMY AND ADENOIDECTOMY  1932  . TOTAL KNEE ARTHROPLASTY    . transperineal implatation of palladium     Social History   Occupational History  . retired  [employer]    and [employer]  . Smoking status: Former Smoker    Packs/day: 2.00    Years: 20.00    Types: Cigarettes    Quit date: 01/22/1961  . Smokeless tobacco: Never Used  . Alcohol use 4.2 oz/week    7 Shots of liquor per week     Comment: 1 drink nightly  . Drug use: No  . Sexual activity: No

## 2016-01-03 NOTE — Patient Instructions (Signed)
Spinal Stenosis Spinal stenosis is an abnormal narrowing of the canals of your spine (vertebrae). CAUSES  Spinal stenosis is caused by areas of bone pushing into the central canals of your vertebrae. This condition can be present at birth (congenital). It also may be caused by arthritic deterioration of your vertebrae (spinal degeneration).  SYMPTOMS   Pain that is generally worse with activities, particularly standing and walking.  Numbness, tingling, hot or cold sensations, weakness, or weariness in your legs.  Frequent episodes of falling.  A foot-slapping gait that leads to muscle weakness. DIAGNOSIS  Spinal stenosis is diagnosed with the use of magnetic resonance imaging (MRI) or computed tomography (CT). TREATMENT  Initial therapy for spinal stenosis focuses on the management of the pain and other symptoms associated with the condition. These therapies include:  Practicing postural changes to lessen pressure on your nerves.  Exercises to strengthen the core of your body.  Loss of excess body weight.  The use of nonsteroidal anti-inflammatory medicines to reduce swelling and inflammation in your nerves. When therapies to manage pain are not successful, surgery to treat spinal stenosis may be recommended. This surgery involves removing excess bone, which puts pressure on your nerve roots. During this surgery (laminectomy), the posterior boney arch (lamina) and excess bone around the facet joints are removed. This information is not intended to replace advice given to you by your health care provider. Make sure you discuss any questions you have with your health care provider. Document Released: 03/31/2003 Document Revised: 01/29/2014 Document Reviewed: 04/18/2012 Elsevier Interactive Patient Education  2017 Elsevier Inc.  

## 2016-01-06 ENCOUNTER — Other Ambulatory Visit: Payer: Self-pay | Admitting: Cardiology

## 2016-01-06 NOTE — Telephone Encounter (Signed)
Just wanted to clarify whether or not patient should be taking this medication. Looks like it was stopped at hospital discharge on 07/02/15 and then put back on as a historical med at his 07/15/15 office visit. Per Dr Oleh Genin dictation at that office visit, he did indicate that the patient was to continue Toprol XL at current dose. Please advise. Thanks, MI

## 2016-01-09 NOTE — Telephone Encounter (Signed)
I cannot see where it has been changed since then, I would refill.

## 2016-01-10 ENCOUNTER — Telehealth (INDEPENDENT_AMBULATORY_CARE_PROVIDER_SITE_OTHER): Payer: Self-pay | Admitting: Physical Medicine and Rehabilitation

## 2016-01-10 NOTE — Telephone Encounter (Signed)
Yes, knew he rode the recumbent bike, need approval for plavix if no approval can consider doing the TF on BT with consent of patient

## 2016-01-10 NOTE — Telephone Encounter (Signed)
Yes L3 trans esi's off plavix. Ask wife about PT

## 2016-01-10 NOTE — Telephone Encounter (Signed)
I have yet to meet Mr. Pollett.  I will forward this to Dr. Aundra Dubin, who last saw the patient in October for his input.  Thanks.

## 2016-01-10 NOTE — Telephone Encounter (Signed)
Would be good if he could continue ASA 81 and only stop Plavix 5 days prior given recurrent CVAs.

## 2016-01-11 NOTE — Telephone Encounter (Signed)
Yes would be good compromise, get date for injection then stop 5 days prior and can use either 81mg  or 325mg  ASA daily until injection then restart Plavix

## 2016-01-11 NOTE — Telephone Encounter (Signed)
Scheduled for 01/26/16 at 330. Will hold Plavix for 5 days and continue ASA.

## 2016-01-12 NOTE — Telephone Encounter (Signed)
No precert required for [facility] Medicare

## 2016-01-22 ENCOUNTER — Other Ambulatory Visit: Payer: Self-pay | Admitting: Cardiology

## 2016-01-24 ENCOUNTER — Other Ambulatory Visit: Payer: Self-pay

## 2016-01-24 DIAGNOSIS — I5032 Chronic diastolic (congestive) heart failure: Secondary | ICD-10-CM

## 2016-01-24 DIAGNOSIS — I350 Nonrheumatic aortic (valve) stenosis: Secondary | ICD-10-CM

## 2016-01-24 MED ORDER — ISOSORBIDE MONONITRATE ER 120 MG PO TB24: 120.0000 mg | ORAL_TABLET | Freq: Every day | ORAL | 1 refills | Status: DC

## 2016-01-26 ENCOUNTER — Encounter (INDEPENDENT_AMBULATORY_CARE_PROVIDER_SITE_OTHER): Payer: Medicare Other | Admitting: Physical Medicine and Rehabilitation

## 2016-02-07 ENCOUNTER — Ambulatory Visit (INDEPENDENT_AMBULATORY_CARE_PROVIDER_SITE_OTHER): Payer: Medicare Other | Admitting: Physical Medicine and Rehabilitation

## 2016-02-07 ENCOUNTER — Encounter (INDEPENDENT_AMBULATORY_CARE_PROVIDER_SITE_OTHER): Payer: Self-pay | Admitting: Physical Medicine and Rehabilitation

## 2016-02-07 VITALS — BP 127/59 | HR 58

## 2016-02-07 DIAGNOSIS — M5416 Radiculopathy, lumbar region: Secondary | ICD-10-CM

## 2016-02-07 MED ORDER — METHYLPREDNISOLONE ACETATE 80 MG/ML IJ SUSP
80.0000 mg | Freq: Once | INTRAMUSCULAR | Status: AC
  Administered 2016-02-07: 80 mg

## 2016-02-07 MED ORDER — LIDOCAINE HCL (PF) 1 % IJ SOLN
0.3300 mL | Freq: Once | INTRAMUSCULAR | Status: AC
  Administered 2016-02-07: 0.3 mL

## 2016-02-07 NOTE — Progress Notes (Signed)
Andrew [name] - 83 y.o. male MRN [id number]  Date of birth: [birth year]  Office Visit Note: Visit Date: 02/07/2016 PCP: Andrew Loffler, MD Referred by: Andrew Loffler, MD  Subjective: Chief Complaint  Patient presents with  . Lower Back - Pain   HPI: [name] is a 83 year old gentleman that we recently saw evaluation and management for his low back pain. He has been something of an enigma with a few years of seen him. Unfortunately in the interim he did have a internal capsule stroke with some left-sided weakness but he is doing fairly well. He continues to have axial low back pain worse with standing and going from sit to stand. No pain with paresthesia. No focal weakness. Radiofrequency ablation is just not help as much as we thought it would. He had successful diagnostic facet blocks but really the radiofrequency ablation has not helped him. We are point to try an epidural injection one time an area where he does have some tightness to see if this helps diagnostically Pain across lower back. Right side = Left side. Can't stand for any length of time. States he finally got a bar stool so he can sit down while preparing meals which helps.    ROS Otherwise per HPI.  Assessment & Plan: Visit Diagnoses:  1. Lumbar radiculopathy     Plan: Findings:   Please see our prior evaluation and management note for further details and justification. Bilateral L3 transforaminal epidural steroid injection. The patient was instructed to restart her Plavix tonight.    Meds & Orders:  Meds ordered this encounter  Medications  . lidocaine (PF) (XYLOCAINE) 1 % injection 0.3 mL  . methylPREDNISolone acetate (DEPO-MEDROL) injection 80 mg    Orders Placed This Encounter  Procedures  . Epidural Steroid injection    Follow-up: Return in about 3 weeks (around 02/28/2016).   Procedures: No procedures performed  Lumbosacral Transforaminal Epidural Steroid Injection - Infraneural Approach with  Fluoroscopic Guidance  Patient: Andrew [name]      Date of Birth: [birth year] MRN: [id number] PCP: Andrew Loffler, MD      Visit Date: 02/07/2016   Universal Protocol:    Date/Time: 01/16/183:20 PM  Consent Given By: the patient  Position: PRONE   Additional Comments: Vital signs were monitored before and after the procedure. Patient was prepped and draped in the usual sterile fashion. The correct patient, procedure, and site was verified.   Injection Procedure Details:  Procedure Site One Meds Administered:  Meds ordered this encounter  Medications  . lidocaine (PF) (XYLOCAINE) 1 % injection 0.3 mL  . methylPREDNISolone acetate (DEPO-MEDROL) injection 80 mg      Laterality: Bilateral  Location/Site:  L3-L4  Needle size: 22 G  Needle type: Spinal  Needle Placement: Transforaminal  Findings:  -Contrast Used: 1 mL iohexol 180 mg iodine/mL   -Comments: Excellent flow of contrast along the nerve and into the epidural space.  Procedure Details: After squaring off the end-plates of the desired vertebral level to get a true AP view, the C-arm was obliqued to the painful side so that the superior articulating process is positioned about 1/3 the length of the inferior endplate.  The needle was aimed toward the junction of the superior articular process and the transverse process of the inferior vertebrae. The needle's initial entry is in the lower third of the foramen through Kambin's triangle. The soft tissues overlying this target were infiltrated with 2-3 ml. of 1% Lidocaine without Epinephrine.  The spinal  needle was then inserted and advanced toward the target using a "trajectory" view along the fluoroscope beam.  Under AP and lateral visualization, the needle was advanced so it did not puncture dura and did not traverse medially beyond the 6 o'clock position of the pedicle. Bi-planar projections were used to confirm position. Aspiration was confirmed to be negative for CSF  and/or blood. A 1-2 ml. volume of Isovue-250 was injected and flow of contrast was noted at each level. Radiographs were obtained for documentation purposes.   After attaining the desired flow of contrast documented above, a 0.5 to 1.0 ml test dose of 0.25% Marcaine was injected into each respective transforaminal space.  The patient was observed for 90 seconds post injection.  After no sensory deficits were reported, and normal lower extremity motor function was noted,   the above injectate was administered so that equal amounts of the injectate were placed at each foramen (level) into the transforaminal epidural space.   Additional Comments:  The patient tolerated the procedure well Dressing: Band-Aid    Post-procedure details: Patient was observed during the procedure. Post-procedure instructions were reviewed.  Patient left the clinic in stable condition.     Clinical History: L-spine MRI 03/25/2012 Multilevel facet arthropathy with moderate stenosis at L2-3, moderate to severe at L3-4 and mild to moderate at L4-5. Facet arthropathy worse at L4-5.  He reports that he quit smoking about 55 years ago. His smoking use included Cigarettes. He has a 40.00 pack-year smoking history. He has never used smokeless tobacco.   Recent Labs  06/23/15 0525  HGBA1C 5.4    Objective:  VS:  HT:    WT:   BMI:     BP:(!) 127/59  HR:(!) 58bpm  TEMP: ( )  RESP:96 % Physical Exam  Musculoskeletal:  The patient ambulates with a walker with a forward flexed spine with good distal strength.    Ortho Exam Imaging: No results found.  Past Medical/Family/Surgical/Social History: Medications & Allergies reviewed per EMR Patient Active Problem List   Diagnosis Date Noted  . Essential hypertension   . Alcohol abuse   . Thrombocytopenia ([place])   . Right-sided cerebrovascular accident (CVA) ([place])   . Chronic renal insufficiency, stage III (moderate) 06/02/2014  . Macular degeneration (senile) of  retina 04/29/2014  . 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 Major depressive disorder, single episode 06/11/2013  . TIA (transient ischemic attack) 06/03/2013  . Pancreatic cyst 01/29/2013  . OSA (obstructive sleep apnea) 12/26/2012  . Chronic diastolic CHF (congestive heart failure) ([place]) 08/19/2012  . COPD, minimal-mild 12/22/2010  . S/P excision of acoustic neuroma 06/18/2010  . Pulmonary nodule 06/17/2010  . Vertigo 06/14/2010  . THYROID NODULE 03/13/2010  . PVD- moderate carotid disease 03/07/2010  . Spinal stenosis, lumbar region, with neurogenic claudication 07/13/2009  . ALLERGIC RHINITIS 09/01/2008  . COLONIC POLYPS, HX OF 09/01/2008  . HYPERCHOLESTEROLEMIA 06/18/2008  . Angina, class II ([place]) 06/18/2008  . Hx of CABG 06/18/2008  . Moderate aortic stenosis 06/18/2008  . GASTROESOPHAGEAL REFLUX DISEASE 06/18/2008   Past Medical History:  Diagnosis Date  . Allergic rhinitis   . AORTIC STENOSIS   . CAD, ARTERY BYPASS GRAFT   . CAROTID ARTERY STENOSIS 03/07/2010   80%  . CHF (congestive heart failure) ([place])   . COPD, mild ([place]) 12/22/2010  . GASTROESOPHAGEAL REFLUX DISEASE   . History of colonic polyps    1999, 2004  . History of prostate cancer 2002   s/p treatment with seeds / radiation  .  HYPERCHOLESTEROLEMIA   . Lumbar spinal stenosis 07/13/2009  . Macular degeneration (senile) of retina 04/29/2014  . Pancreatic cyst 01/29/2013   Noted on MRI scan from [facility] on 07/27/2011.  I reviewed report at patient request on 01-29-2013.  "Small cystic focus in the posterior pancreatic head.  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  Follow up MRI is advised. The main pancreatic duct remains normal in appearance."  Patient do  . S/P excision of acoustic neuroma   . Skin cancer   . Stroke ([place])   . Thyroid nodule 05/2010   Abnormal biopsy, 83 year old patient and his wife have made  informed decision to not pursue surgical resection. Potential risk including cancer has been thoroughly discussed with the patient.   Family History  Problem Relation Age of Onset  . Emphysema Brother   . Lung cancer Brother   . Heart disease Father   . Hypertension Father   . Heart attack Father   . Colon cancer Sister   . Non-Hodgkin's lymphoma Sister   . Heart disease Brother   . Colon cancer Sister   . Skin cancer Sister   . Alcohol abuse    . Arthritis    . Cancer    . Macular degeneration    . Lung cancer Daughter    Past Surgical History:  Procedure Laterality Date  . CATARACT EXTRACTION    . CATARACT EXTRACTION  2010  . CHOLECYSTECTOMY    . CORONARY ARTERY BYPASS GRAFT    . CRANIECTOMY FOR EXCISION OF ACOUSTIC NEUROMA  1985  . EYE SURGERY    . SHOULDER SURGERY    . TONSILLECTOMY AND ADENOIDECTOMY  1932  . TOTAL KNEE ARTHROPLASTY    . transperineal implatation of palladium     Social History   Occupational History  . retired  [employer]    and [employer]  . Smoking status: Former Smoker    Packs/day: 2.00    Years: 20.00    Types: Cigarettes    Quit date: 01/22/1961  . Smokeless tobacco: Never Used  . Alcohol use 4.2 oz/week    7 Shots of liquor per week     Comment: 1 drink nightly  . Drug use: No  . Sexual activity: No

## 2016-02-07 NOTE — Patient Instructions (Signed)

## 2016-02-07 NOTE — Procedures (Signed)
Lumbosacral Transforaminal Epidural Steroid Injection - Infraneural Approach with Fluoroscopic Guidance  Patient: Andrew [name]      Date of Birth: Apr 06, 1923 MRN: [id number] PCP: [name], MD      Visit Date: 02/07/2016   Universal Protocol:    Date/Time: 01/16/183:20 PM  Consent Given By: the patient  Position: PRONE   Additional Comments: Vital signs were monitored before and after the procedure. Patient was prepped and draped in the usual sterile fashion. The correct patient, procedure, and site was verified.   Injection Procedure Details:  Procedure Site One Meds Administered:  Meds ordered this encounter  Medications  . lidocaine (PF) (XYLOCAINE) 1 % injection 0.3 mL  . methylPREDNISolone acetate (DEPO-MEDROL) injection 80 mg      Laterality: Bilateral  Location/Site:  L3-L4  Needle size: 22 G  Needle type: Spinal  Needle Placement: Transforaminal  Findings:  -Contrast Used: 1 mL iohexol 180 mg iodine/mL   -Comments: Excellent flow of contrast along the nerve and into the epidural space.  Procedure Details: After squaring off the end-plates of the desired vertebral level to get a true AP view, the C-arm was obliqued to the painful side so that the superior articulating process is positioned about 1/3 the length of the inferior endplate.  The needle was aimed toward the junction of the superior articular process and the transverse process of the inferior vertebrae. The needle's initial entry is in the lower third of the foramen through Kambin's triangle. The soft tissues overlying this target were infiltrated with 2-3 ml. of 1% Lidocaine without Epinephrine.  The spinal needle was then inserted and advanced toward the target using a "trajectory" view along the fluoroscope beam.  Under AP and lateral visualization, the needle was advanced so it did not puncture dura and did not traverse medially beyond the 6 o'clock position of the pedicle. Bi-planar  projections were used to confirm position. Aspiration was confirmed to be negative for CSF and/or blood. A 1-2 ml. volume of Isovue-250 was injected and flow of contrast was noted at each level. Radiographs were obtained for documentation purposes.   After attaining the desired flow of contrast documented above, a 0.5 to 1.0 ml test dose of 0.25% Marcaine was injected into each respective transforaminal space.  The patient was observed for 90 seconds post injection.  After no sensory deficits were reported, and normal lower extremity motor function was noted,   the above injectate was administered so that equal amounts of the injectate were placed at each foramen (level) into the transforaminal epidural space.   Additional Comments:  The patient tolerated the procedure well Dressing: Band-Aid    Post-procedure details: Patient was observed during the procedure. Post-procedure instructions were reviewed.  Patient left the clinic in stable condition.

## 2016-02-17 ENCOUNTER — Encounter (INDEPENDENT_AMBULATORY_CARE_PROVIDER_SITE_OTHER): Payer: Self-pay | Admitting: Physical Medicine and Rehabilitation

## 2016-02-29 ENCOUNTER — Ambulatory Visit (INDEPENDENT_AMBULATORY_CARE_PROVIDER_SITE_OTHER): Payer: Medicare Other | Admitting: Physical Medicine and Rehabilitation

## 2016-02-29 ENCOUNTER — Encounter (INDEPENDENT_AMBULATORY_CARE_PROVIDER_SITE_OTHER): Payer: Self-pay | Admitting: Physical Medicine and Rehabilitation

## 2016-02-29 VITALS — BP 139/58 | HR 58

## 2016-02-29 DIAGNOSIS — M47816 Spondylosis without myelopathy or radiculopathy, lumbar region: Secondary | ICD-10-CM

## 2016-02-29 DIAGNOSIS — I639 Cerebral infarction, unspecified: Secondary | ICD-10-CM

## 2016-02-29 DIAGNOSIS — G8929 Other chronic pain: Secondary | ICD-10-CM

## 2016-02-29 DIAGNOSIS — M545 Low back pain: Secondary | ICD-10-CM | POA: Diagnosis not present

## 2016-02-29 NOTE — Progress Notes (Signed)
Andrew [name] - 84 y.o. male MRN [id number]  Date of birth: 11-02-1923  Office Visit Note: Visit Date: 02/29/2016 PCP: [name], MD Referred by: [name], MD  Subjective: Chief Complaint  Patient presents with  . Lower Back - Pain   HPI: Andrew [name] is a 83 year old gentleman that we recently saw evaluation and management for his low back pain. He has been something of an enigma with chronic low back pain which is been recalcitrant to basically most interventions. Unfortunately in the interim he did have a internal capsule stroke with some left-sided weakness but he is doing fairly well. He continues to have axial low back pain worse with standing and going from sit to stand. No pain with paresthesia. No focal weakness. Radiofrequency ablation just did not help as much as we thought it would. He had successful diagnostic facet blocks but really the radiofrequency ablation has not helped him. From a diagnostic and therapeutic standpoint we did complete transforaminal epidural steroid injections at the level of there is some narrowing but once again this is not helping very much at all. He reports pain across the whole lower back not getting better but not getting worse. He denies any focal weakness or foot drop anymore than he has had with the prior stroke and he is doing well with that. He's had no bowel or bladder issues. His biggest complaint is standing for any length of time and having to sit down. His wife who is present today who also provides a lot of the history states that she is basically started to work around the problem with trying to get him to sit when he's been standing to do anything and just changing position and felt like that's [place] better overall and he has been with changing some of the issues around the house. We also talked at length about medications. He is able to take tramadol and he does tolerate it does help but he tells me he is of the degeneration that does  not try to take pain medicines. She has a hard time even given him one Aleve. Evidently these do help when he takes them. So I had a long discussion today about taking the medications when he needs to be mobile and moving at least to a point where he can be functional and maybe that was something that has been missed overall. We did talk about TENS units as well. They have had a TENS unit in the past for somebody else and were encouraged to hear about trying this.    Patient reports "no change" since injection 3 weeks ago. Pain across whole lower back is not better, but not worse. Review of Systems  Constitutional: Negative for chills, fever, malaise/fatigue and weight loss.  HENT: Negative for hearing loss and sinus pain.   Eyes: Negative for blurred vision, double vision and photophobia.  Respiratory: Negative for cough and shortness of breath.   Cardiovascular: Negative for chest pain, palpitations and leg swelling.  Gastrointestinal: Negative for abdominal pain, nausea and vomiting.  Genitourinary: Negative for flank pain.  Musculoskeletal: Positive for back pain. Negative for myalgias.  Skin: Negative for itching and rash.  Neurological: Negative for tremors, focal weakness and weakness.  Endo/Heme/Allergies: Negative.   Psychiatric/Behavioral: Negative for depression.  All other systems reviewed and are negative.  Otherwise per HPI.  Assessment & Plan: Visit Diagnoses:  1. Spondylosis without myelopathy or radiculopathy, lumbar region   2. Chronic bilateral low back pain without sciatica  3. Right-sided cerebrovascular accident (CVA) ([place])     Plan: Findings:  Chronic worsening at times low back pain severe enough that it does limit his daily activities of what he would like to do. They have done some work around's and activity modifications and maybe are helping with his back pain to a degree. He continues to want a solution that is more efficacious and lasting. I did have a little  length of discussion about mild pain medications that are actually helping him when he does take him and hopefully they'll continue to use that as needed. We also talked about a TENS unit. We gave him some information on how to order one and will see how that goes. His wife is to let us know how the TENS unit does with him. I told him I really don't have much in the way of anything else to offer from a procedural standpoint at this point. I was very hopeful the radiofrequency ablation would've made some difference in his axial back pain. For right now we'll see them back as needed. I spent more than 25 minutes speaking face-to-face with the patient with 50% of the time in counseling.    Meds & Orders: No orders of the defined types were placed in this encounter.  No orders of the defined types were placed in this encounter.   Follow-up: Return if symptoms worsen or fail to improve.   Procedures: No procedures performed  No notes on file   Clinical History: L-spine MRI 03/25/2012 Multilevel facet arthropathy with moderate stenosis at L2-3, moderate to severe at L3-4 and mild to moderate at L4-5. Facet arthropathy worse at L4-5.  He reports that he quit smoking about 55 years ago. His smoking use included Cigarettes. He has a 40.00 pack-year smoking history. He has never used smokeless tobacco.   Recent Labs  06/23/15 0525  HGBA1C 5.4    Objective:  VS:  HT:    WT:   BMI:     BP:(!) 139/58  HR:(!) 58bpm  TEMP: ( )  RESP:  Physical Exam  Constitutional: He is oriented to person, place, and time. He appears well-developed and well-nourished. No distress.  HENT:  Head: Normocephalic and atraumatic.  Eyes: Conjunctivae are normal. Pupils are equal, round, and reactive to light.  Neck: Normal range of motion. Neck supple.  Cardiovascular: Regular rhythm and intact distal pulses.   Pulmonary/Chest: Effort normal. No respiratory distress.  Musculoskeletal:  Patient ambulates with a  walker with good distal strength maybe a little bit of weakness on the left but mild. He has pain with extension rotation of the lumbar spine is very stiff. No focal triggerpoints. No pain over the greater trochanters.  Neurological: He is alert and oriented to person, place, and time. No sensory deficit. He exhibits normal muscle tone.  Skin: Skin is warm and dry. No rash noted. No erythema.  Psychiatric: He has a normal mood and affect.  Nursing note and vitals reviewed.   Ortho Exam Imaging: No results found.  Past Medical/Family/Surgical/Social History: Medications & Allergies reviewed per EMR Patient Active Problem List   Diagnosis Date Noted  . Essential hypertension   . Alcohol abuse   . Thrombocytopenia ([place])   . Right-sided cerebrovascular accident (CVA) ([place])   . Chronic renal insufficiency, stage III (moderate) 06/02/2014  . Macular degeneration (senile) of retina 04/29/2014  . 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 Major depressive disorder, single episode 06/11/2013  . TIA (transient ischemic attack) 06/03/2013  . Pancreatic  cyst 01/29/2013  . OSA (obstructive sleep apnea) 12/26/2012  . Chronic diastolic CHF (congestive heart failure) ([place]) 08/19/2012  . COPD, minimal-mild 12/22/2010  . S/P excision of acoustic neuroma 06/18/2010  . Pulmonary nodule 06/17/2010  . Vertigo 06/14/2010  . THYROID NODULE 03/13/2010  . PVD- moderate carotid disease 03/07/2010  . Spinal stenosis, lumbar region, with neurogenic claudication 07/13/2009  . ALLERGIC RHINITIS 09/01/2008  . COLONIC POLYPS, HX OF 09/01/2008  . HYPERCHOLESTEROLEMIA 06/18/2008  . Angina, class II ([place]) 06/18/2008  . Hx of CABG 06/18/2008  . Moderate aortic stenosis 06/18/2008  . GASTROESOPHAGEAL REFLUX DISEASE 06/18/2008   Past Medical History:  Diagnosis Date  . Allergic rhinitis   . AORTIC STENOSIS   . CAD, ARTERY BYPASS GRAFT   . CAROTID ARTERY STENOSIS 03/07/2010   80%  . CHF (congestive heart failure) ([place])     . COPD, mild ([place]) 12/22/2010  . GASTROESOPHAGEAL REFLUX DISEASE   . History of colonic polyps    1999, 2004  . History of prostate cancer 2002   s/p treatment with seeds / radiation  . HYPERCHOLESTEROLEMIA   . Lumbar spinal stenosis 07/13/2009  . Macular degeneration (senile) of retina 04/29/2014  . Pancreatic cyst 01/29/2013   Noted on MRI scan from [facility] on 07/27/2011.  I reviewed report at patient request on 01-29-2013.  "Small cystic focus in the posterior pancreatic head.  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  Follow up MRI is advised. The main pancreatic duct remains normal in appearance."  Patient do  . S/P excision of acoustic neuroma   . Skin cancer   . Stroke ([place])   . Thyroid nodule 05/2010   Abnormal biopsy, 83 year old patient and his wife have made informed decision to not pursue surgical resection. Potential risk including cancer has been thoroughly discussed with the patient.   Family History  Problem Relation Age of Onset  . Emphysema Brother   . Lung cancer Brother   . Heart disease Father   . Hypertension Father   . Heart attack Father   . Colon cancer Sister   . Non-Hodgkin's lymphoma Sister   . Heart disease Brother   . Colon cancer Sister   . Skin cancer Sister   . Alcohol abuse    . Arthritis    . Cancer    . Macular degeneration    . Lung cancer Daughter    Past Surgical History:  Procedure Laterality Date  . CATARACT EXTRACTION    . CATARACT EXTRACTION  2010  . CHOLECYSTECTOMY    . CORONARY ARTERY BYPASS GRAFT    . CRANIECTOMY FOR EXCISION OF ACOUSTIC NEUROMA  1985  . EYE SURGERY    . SHOULDER SURGERY    . TONSILLECTOMY AND ADENOIDECTOMY  1932  . TOTAL KNEE ARTHROPLASTY    . transperineal implatation of palladium     Social History   Occupational History  . retired  At [employer]    and [place] [employer]   . Smoking status: Former Smoker    Packs/day: 2.00    Years: 20.00    Types: Cigarettes    Quit date: 01/22/1961  . Smokeless tobacco: Never Used  . Alcohol use 4.2 oz/week    7 Shots of liquor per week     Comment: 1 drink nightly  . Drug use: No  . Sexual activity: No

## 2016-03-07 ENCOUNTER — Ambulatory Visit (INDEPENDENT_AMBULATORY_CARE_PROVIDER_SITE_OTHER): Payer: Medicare Other | Admitting: Neurology

## 2016-03-07 ENCOUNTER — Encounter: Payer: Self-pay | Admitting: Neurology

## 2016-03-07 VITALS — BP 122/59 | HR 51 | Wt 187.8 lb

## 2016-03-07 DIAGNOSIS — G3184 Mild cognitive impairment, so stated: Secondary | ICD-10-CM | POA: Diagnosis not present

## 2016-03-07 NOTE — Patient Instructions (Signed)
I had a long discussion with the patient and his wife regarding his TIA as well as mild cognitive impairment both of which appear to be stable at the present time. Continue Plavix for stroke prevention but discontinue aspirin as is having bruising and I do not see any benefit in the long-term dual antiplatelet therapy for stroke prevention. He does have an upcoming appointment with his cardiologist Dr. Saunders Revel and I suggested that he discuss this with him. I also counseled him to use his cane at all times while walking to avoid falls and injuries. She will return for follow-up in the future only if necessary and no routine scheduled follow-up appointment was made.

## 2016-03-07 NOTE — Progress Notes (Signed)
[facility] [address] [place]. [place] [address] [phone]       OFFICE FOLLOW-UP NOTE  [name] Date of Birth:  [birth year] Medical Record Number:  [id number]   HPI: Initial  09/05/2015 ; [name] is a 83 year old Caucasian male seen today for follow-up after first office visit following admission for stroke in May 2017. He is accompanied today by his wife.83 y.o. male with medical history significant for HTN, HLD, CAD s/p CABG, COPD, CHF, presenting to the ED with one day history of progressive left sided weakness accompanied by falls x2 . He never had a similar episode. Denies vertigo dizziness or vision changes. Denies headaches. No dysarthria. No dysphagia. No confusion. No seizures. No new urinary symptoms (known urge incontinence) Denies any chest pain, or shortness of breath. Denies any fever or chills, or night sweats.Does not smoke. No new meds or hormonal supplements. Does take a regular ASA a day, with no other antiplatelets or anticoagulants. Patient is compliant with his medications. Denies any recent long distance trips. No recent surgeries. No sick contacts. No new stressors present at work and in personal life. He is very active, exercising daily. He is not a diabetic. No family history of stroke but does have a history of TIA in 2015. Patient was not administered TPA as is beyond time window for treatment consideration. MRI scan of the brain showed a small acute nonhemorrhagic infarct involving posterior limb of internal capsule with a questionable tiny left parietal infarct as well. Both were felt to be due to small vessel disease. Carotid ultrasound showed 1-39% right ICA in 6-79% left ICA stenosis which was felt to be asymptomatic. Transthoracic echo showed normal ejection fraction. LDL cholesterol was elevated at 56% grams percent. Hemoglobin A1c was 5.4. Patient was started on aspirin for stroke prevention and transferred to centers for  [facility] for inpatient rehabilitation. His done well. He is now walking with a walker. He still feels that his left leg is dragging at times. He feels he gets exertional chest pain which improves with use of nitroglycerin spray. He has an upcoming appointment with cardiologist Dr. Aundra Dubin next week to discuss this. Patient has had one fall since discharge he feels his left foot got stuck. He has also noticed memory difficulties since this stroke. He has finished outpatient physical and occupational therapy last week. Is tolerating aspirin well without bleeding or bruising. States his blood pressure is well controlled and today it is 140/61 which is slightly high. Update to/14/2018 ; he returns for follow-up after last visit in August 2017. Is accompanied by his wife. Patient states his walking a lot better now and contents to use a cane most of the time and uses walker only when he is tired. He has finished physical therapy which seems to have helped him a lot. He is starting Plavix and aspirin but complains of significant increased bruising however is had no bleeding episodes. He does have an upcoming appointment to see his cardiologist in 2 weeks. He he feels his memory is unchanged but wife states that his short-term memory is quite poor. Patient has significantly limited vision and is legally blind hence cannot follow crossword puzzles or 2 word searches. He has macular degeneration which is more severe in the left eye compared to the right. He has not had any recurrent stroke or TIA symptoms. His blood pressure is well controlled and today it is 122/59 in office. ROS:   14 system review of systems  is positive for  hearing loss, loss of vision,  , easy bruising, memory loss, confusion, tumors sleep, decreased energy and all other systems negative  PMH:  Past Medical History:  Diagnosis Date  . Allergic rhinitis   . AORTIC STENOSIS   . CAD, ARTERY BYPASS GRAFT   . CAROTID ARTERY STENOSIS 03/07/2010    80%  . CHF (congestive heart failure) ([place])   . COPD, mild ([place]) 12/22/2010  . GASTROESOPHAGEAL REFLUX DISEASE   . History of colonic polyps    1999, 2004  . History of prostate cancer 2002   s/p treatment with seeds / radiation  . HYPERCHOLESTEROLEMIA   . Lumbar spinal stenosis 07/13/2009  . Macular degeneration (senile) of retina 04/29/2014  . Pancreatic cyst 01/29/2013   Noted on MRI scan from [facility] on 07/27/2011.  I reviewed report at patient request on 01-29-2013.  "Small cystic focus in the posterior pancreatic head.  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  Follow up MRI is advised. The main pancreatic duct remains normal in appearance."  Patient do  . S/P excision of acoustic neuroma   . Skin cancer   . Stroke ([place])   . Thyroid nodule 05/2010   Abnormal biopsy, 82 year old patient and his wife have made informed decision to not pursue surgical resection. Potential risk including cancer has been thoroughly discussed with the patient.    Social History:  Social History   Social History  . Marital status: Married    Spouse name: N/A  . Number of children: N/A  . Years of education: N/A   Occupational History  . retired  At [employer]  . Smoking status: Former Smoker    Packs/day: 2.00    Years: 20.00    Types: Cigarettes    Quit date: 01/22/1961  . Smokeless tobacco: Never Used  . Alcohol use 4.2 oz/week    7 Shots of liquor per week     Comment: 1 drink nightly  . Drug use: No  . Sexual activity: No   Other Topics Concern  . Not on file   Social History Narrative  . No narrative on file    Medications:   Current Outpatient Prescriptions on File Prior to Visit  Medication Sig Dispense Refill  . acetaminophen (TYLENOL) 500 MG tablet Take 500 mg by mouth every 6 (six) hours as needed (pain).    Marland Kitchen  albuterol (PROVENTIL HFA;VENTOLIN HFA) 108 (90 BASE) MCG/ACT inhaler Inhale 2 puffs into the lungs every 6 (six) hours as needed for wheezing or shortness of breath. 1 Inhaler 3  . aspirin EC 81 MG tablet Take 1 tablet (81 mg total) by mouth daily.    . B Complex-C-Folic Acid (NEPHRO-VITE PO) Take 1 tablet by mouth daily.      . cholecalciferol (VITAMIN D) 1000 units tablet Take 1,000 Units by mouth daily.    . clopidogrel (PLAVIX) 75 MG tablet Take 1 tablet (75 mg total) by mouth daily. 90 tablet 1  . FLUoxetine (PROZAC) 10 MG tablet TAKE 1 TABLET DAILY 90 tablet 1  . furosemide (LASIX) 40 MG tablet Take as directed 135 tablet 3  . isosorbide mononitrate (IMDUR) 120 MG 24 hr tablet Take 1 tablet (120 mg total) by mouth daily. 90 tablet 1  . metoprolol succinate (TOPROL-XL) 25 MG 24 hr tablet Take  25 mg by mouth daily.    . Multiple Vitamins-Minerals (PRESERVISION/LUTEIN) CAPS Take 1 capsule by mouth 2 (two) times daily.    . nitroGLYCERIN (NITROLINGUAL) 0.4 MG/SPRAY spray PLACE 1 SPRAY UNDER THE TONGUE EVERY 5 (FIVE) MINUTES AS NEEDED. 4.9 g 12  . OVER THE COUNTER MEDICATION Place 1 drop into both eyes daily as needed (dry eyes). OTC lubricating eye drop    . potassium chloride (K-DUR) 10 MEQ tablet Take 2 tablets (20 mEq total) by mouth daily. 180 tablet 1  . pravastatin (PRAVACHOL) 40 MG tablet Take 1 tablet (40 mg total) by mouth daily. 30 tablet 1  . traMADol (ULTRAM) 50 MG tablet 1/2 to 1 tab by mouth Q 8 hours PRN pain 40 tablet 0   No current facility-administered medications on file prior to visit.     Allergies:  No Known Allergies  Physical Exam General: well developed, well nourished elderly Caucasian male, seated, in no evident distress Head: head normocephalic and atraumatic.  Neck: supple with no carotid or supraclavicular bruits Cardiovascular: regular rate and rhythm, no murmurs Musculoskeletal: no deformity Skin:  no rash/petichiae Vascular:  Normal pulses all  extremities Vitals:   03/07/16 1500  BP: (!) 122/59  Pulse: (!) 51   Neurologic Exam Mental Status: Awake and fully alert. Oriented to place and time. Mild impaired short-term memory. Diminished recall.. Attention span, concentration and fund of knowledge appropriate. Mood and affect appropriate.  Cranial Nerves: Fundoscopic exam not done. 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 Extraocular movements full without nystagmus. Visual fields full to confrontation. Hearing diminished bilaterally. Facial sensation intact. Face, tongue, palate moves normally and symmetrically.  Motor: Normal bulk and tone. Normal strength in all tested extremity muscles. Sensory.: intact to touch ,pinprick .position and vibratory sensation.  Coordination: Rapid alternating movements normal in all extremities. Finger-to-nose and heel-to-shin performed accurately bilaterally. Gait and Station: Arises from chair without difficulty. Stance is slightly wide-based. Uses a walker slight dragging of the left leg.  Not ablle to heel, toe and tandem walk without difficulty.  Reflexes: 1+ and symmetric. Toes downgoing.   NIHSS  2 Modified Rankin 3   ASSESSMENT: 53 year Caucasian male with right posterior limb internal capsule infarct in May 2017 due to small vessel disease was doing well except for mild dragging of the left leg. Vascular risk factors of hypertension hyperlipidemia, age and sex. Mild memory difficulties due to age-related mild cognitive impairment.  PLAN: I had a long discussion with the patient and his wife regarding his TIA as well as mild cognitive impairment both of which appear to be stable at the present time. Continue Plavix for stroke prevention but discontinue aspirin as is having bruising and I do not see any benefit in the long-term dual antiplatelet therapy for stroke prevention. He does have an upcoming appointment with his cardiologist Dr. Saunders Revel and I  suggested that he discuss this with him. I also counseled him to use his cane at all times while walking to avoid falls and injuries. She will return for follow-up in the future only if necessary and no routine scheduled follow-up appointment was made. Greater than 50% of time during this 25 minute visit was spent on ,explanation of diagnosis of mild cognitive impairment, TIA, planning of further management, discussion with patient and family and coordination of care Antony Contras, MD  [facility] [address] [place], [place] [address]  Phone [phone] Fax [phone] Note: This document was prepared  with digital dictation and possible smart phrase technology. Any transcriptional errors that result from this process are unintentional

## 2016-03-09 ENCOUNTER — Encounter: Payer: Self-pay | Admitting: Internal Medicine

## 2016-03-18 NOTE — Progress Notes (Signed)
Patient ID: BURK RAGNO, male   DOB: [birth year], 83 y.o.   MRN: [id number] PCP: Dr [name]  83 y.o. with history of CAD s/p CABG, CKD, CVA, and moderate aortic stenosis returns for followup.   He has had a history of chronic stable angina for years.  In 12/14, he had a Lexiscan Cardiolite which showed EF 42% with medium-sized scar in the lateral wall along with mild peri-infarct ischemia.  This was deemed a low risk study. I started the patient on ranolazine, which seemed to help his angina, but this was subsequently stopped by nephrology.  Therefore, I increased his Imdur, he is now taking 90 mg daily.   In 5/17, he was admitted with right internal capsule CVA and left-sided weakness.  Carotids showed stable A999333 LICA stenosis and echo showed EF 50-55% with moderate AS and moderate AI.  He did not have a monitor implanted for atrial fibrillation screening.   In 8/17, he was diagnosed with an ischemic event affecting the right eye, apparently embolic.  This was diagnosed by his ophthalmologist and I do not have these records.    83 y.o. Kolenovic was last seen by Dr. Aundra Dubin in 10/2015. At that time he reported stable angina with heavy exertion as well as stable chronic dyspnea on exertion. Isosorbide mononitrate was increased to 120 mg daily at this visit, as Mr. Grimley was still needing to use NTG spray a few days per week. Since that time, his chest pain has been well-controlled. For some weeks, he was taking prophylactic nitroglycerin spray before riding his right common bike area and however, after clear findings with his daughter, he has stopped doing this. He was also affected by neuro virus infection about 6-8 weeks ago with voluminous diarrhea. This complicated by some abdominal distention and shortness of breath, possibly due to fluid retention. He was not taking his Lasix regularly at that time. Since then, he has returned back to his usual diuretic regimen with resolution of the abdominal distention and his  exertional dyspnea has returned back to baseline. He has not had any episodes of chest pain since his last visit. He also denies orthopnea and PND. He has lost a little bit of weight, which he attributes to his GI infection.  Mr. Gable reports balance difficulties. He has not had any falls but notes easy bruising. He was recently evaluated by Dr. Leonie Man in neurology clinic, who suggested discontinuation of aspirin with single antiplatelet therapy with clopidogrel going forward. He was also evaluated by [facility] in late 10/2015. No further interventions were done at that time; repeat carotid Doppler in 6 months was recommended.  ECG (10/2015): NSR, IVCD 124 msec, inferior TWIs   Labs (11/13): K 4.4, creatinine 1.3, LDL 66, HDL 53 Labs (7/14): BNP 590 Labs (8/14): K 4.4, creatinine 1.4, BNP 218, LDL 53, HDL 43 Labs (11/14): K 4.1, creatinine 1.3 Labs (1/15): Creatinine 1.7 Labs (2/15): K 3.9, creatinine 1.4 Labs (3/15): K 4.1, creatinine 1.6 Labs (5/15): K 4.6, creatinine 1.3, LDL 50, HDL 45 Labs (10/16): K 4.4, creatinine 1.39, TSH normal, LDL 57, HDL 51 Labs (6/17): K 4.4, creatinine 1.32, HCT 43.2, LDL 56, HDL 45  PMH: 1. CAD: s/p CABG in 1991 with LIMA-LAD, SVG-PDA, seq SVG to OM 1,2,3.  [place] [id number] complicated by cholesterol atheroemboli and AKI.  One limb of seq SVG-OM 1,2,3 known to be occluded.  Chronic stable angina.  Lexiscan Cardiolite (12/14) with EF 42%, inferolateral hypokinesis, medium-sized scar in the lateral wall with mild  peri-infarct ischemia, overall low risk.  2. Carotid stenosis: 7/13 carotid dopplers 40-59% on right, 60-79% on left.  Carotids (1/14) with [id number] RICA, A999333 LICA.  Carotids (0000000) with A999333 LICA, 123456 RICA.  Possible TIA in 4/15. Carotids ([id number]) with A999333 LICA, 123456 RICA.  Carotids (Q000111Q) with A999333 LICA.  Followed by [facility].  - Carotids ([id number]) with A999333 LICA stenosis.  3. GERD 4. Polyps 5. Thyroid nodule 6. S/p excision of acoustic neuroma 7. Prostate  cancer treated with seed implantation in 2002 8. COPD: mild 9. Cholecystectomy 10. CKD 11. Aortic stenosis: Moderate.  Echo (2/14) with EF 50-55%, mild LVH, moderate AS with AVA 1.3 cm^2 and mean gradient 23 mmHg.  Echo (8/14) with EF 55-60%, moderate LVH, moderate AS with mean gradient 27 mmHg and AVA 1.03 cm^2, mild to moderate AI, mild MR.  Echo (8/15) with EF 50-55%, moderate AS with mean gradient 28/peak 45, mild AI, mild MR, severe LAE.  - Echo (5/17) with EF 50-55%, moderate focal basal septal hypertrophy, moderate AS (mean gradient 20 mmHg), moderate AI, PASP 46 mmHg.  12. Chronic diastolic CHF.  13. Macular degeneration 14. Mild to moderate OSA on sleep study. He is not on CPAP.  15. Depression 16. Migraines 17. Low back pain/spinal stenosis  18. CVA: 5/17 small right internal capsule CVA with left-sided weakness.  - 0000000 embolic event to right eye.   SH: Retired Chief Financial Officer, quit smoking 1963, married, lives in [facility].  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   Current Outpatient Prescriptions  Medication Sig Dispense Refill  . acetaminophen (TYLENOL) 500 MG tablet Take 500 mg by mouth every 6 (six) hours as needed (pain).    Marland Kitchen albuterol (PROVENTIL HFA;VENTOLIN HFA) 108 (90 BASE) MCG/ACT inhaler Inhale 2 puffs into the lungs every 6 (six) hours as needed for wheezing or shortness of breath. 1 Inhaler 3  . aspirin EC 81 MG tablet Take 1 tablet (81 mg total) by mouth daily.    . B Complex-C-Folic Acid (NEPHRO-VITE PO) Take 1 tablet by mouth daily.      . cholecalciferol (VITAMIN D) 1000 units tablet Take 1,000 Units by mouth daily.    . clopidogrel (PLAVIX) 75 MG tablet Take 1 tablet (75 mg total) by mouth daily. 90 tablet 1  . FLUoxetine (PROZAC) 10 MG tablet TAKE 1 TABLET DAILY 90 tablet 1  . furosemide (LASIX) 40 MG tablet Take as directed 135 tablet 3  . isosorbide mononitrate (IMDUR) 120 MG 24 hr tablet Take 1 tablet (120 mg total) by mouth  daily. 90 tablet 1  . metoprolol succinate (TOPROL-XL) 25 MG 24 hr tablet Take 25 mg by mouth daily.    . Multiple Vitamins-Minerals (PRESERVISION/LUTEIN) CAPS Take 1 capsule by mouth 2 (two) times daily.    . nitroGLYCERIN (NITROLINGUAL) 0.4 MG/SPRAY spray PLACE 1 SPRAY UNDER THE TONGUE EVERY 5 (FIVE) MINUTES AS NEEDED. 4.9 g 12  . OVER THE COUNTER MEDICATION Place 1 drop into both eyes daily as needed (dry eyes). OTC lubricating eye drop    . potassium chloride (K-DUR) 10 MEQ tablet Take 2 tablets (20 mEq total) by mouth daily. 180 tablet 1  . pravastatin (PRAVACHOL) 40 MG tablet Take 1 tablet (40 mg total) by mouth daily. 30 tablet 1  . traMADol (ULTRAM) 50 MG tablet 1/2 to 1 tab by mouth Q 8 hours PRN pain 40 tablet 0   No current facility-administered medications for this visit.  BP 134/62 (BP Location: Right Arm)   Pulse (!) 51   Ht 5\' 9"  (1.753 m)   Wt 185 lb 12.8 oz (84.3 kg)   BMI 27.44 kg/m  General: Well-developed, well-nourished elderly man, seated comfortably in the exam room. He is accompanied by his wife and daughter. Neck: Is significant JVD or HJR. Supple without lymphadenopathy. Lungs: CTAB  CV: Nondisplaced PMI.  Heart regular S1/S2, no [id number], 2/6 systolic murmur loudest at the right upper sternal border.  No edema.  2+ radial and pedal pulses bilaterally. Abdomen: Soft, nontender, no hepatosplenomegaly, no distention.  Skin: Intact without lesions or rashes. Bruising noted on both forearms. Neurologic: Alert and oriented x 3.  Psych: Normal affect. Extremities: No clubbing or cyanosis.   Assessment/Plan: 1. CAD: s/p CABG with long history of chronic stable angina.  His chest pain has been well-controlled with escalation of isosorbide mononitrate. He remains on metoprolol succinate. Though his resting heart rate is a little low today, he is asymptomatic. We will continue these medications. Given easy bruising, it is reasonable to discontinue aspirin and continue  with clopidogrel going forward. We will also continue pravastatin. 2. Chronic diastolic CHF: EF [id number] on 5/17 echo. NYHA class III symptoms. The patient appears euvolemic and well compensated today. We will continue his current furosemide regimen. He is scheduled for labs with his PCP in the next 2 weeks.   3. Hyperlipidemia: Reasonable LDL at last check in 06/2015. 4. CKD: Stable. To follow with Dr. Justin Mend. Suggest basic metabolic panel when the patient has blood drawn with his PCP early next month. 5. Aortic stenosis: Moderate AS and AI noted on 5/17 echo.  No progression of symptoms. We will plan to repeat echocardiogram in about 3 months. Patient advised to contact us if he has any new symptoms such as progressive dyspnea, chest pain, or lightheadedness. He would not be a good candidate for surgical repair, though TAVR could be considered in the future if his aortic valvular disease were to progress. 6. Carotid stenosis: Followed by [facility]. Continue pravastatin and clopidogrel. We will discontinue aspirin due to easy bruising and fall risk. 7. OSA: Mild to moderate OSA on sleep study.  Not on CPAP at this point.   8. CVA: Right internal capsule CVA in 5/17 with residual left-sided weakness.  Neurology did not think this was cardio-embolic and LINQ monitor was not recommended.  However, patient had a recurrent event in 8/17, this time it was an embolic event to the right eye per the patient's report .  - Continue statin.  - Discontinue aspirin and continue clopidogrel 75 mg daily. - Patient was evaluated by [facility] in the late 10/2015. Plan is to repeat carotid duplex in 6 months.  Follow-up: Return to clinic in 3 months.  Nigel Ericsson 03/18/2016

## 2016-03-19 ENCOUNTER — Encounter: Payer: Self-pay | Admitting: Internal Medicine

## 2016-03-19 ENCOUNTER — Ambulatory Visit (INDEPENDENT_AMBULATORY_CARE_PROVIDER_SITE_OTHER): Payer: Medicare Other | Admitting: Internal Medicine

## 2016-03-19 VITALS — BP 134/62 | HR 51 | Ht 69.0 in | Wt 185.8 lb

## 2016-03-19 DIAGNOSIS — I2510 Atherosclerotic heart disease of native coronary artery without angina pectoris: Secondary | ICD-10-CM

## 2016-03-19 DIAGNOSIS — G4733 Obstructive sleep apnea (adult) (pediatric): Secondary | ICD-10-CM

## 2016-03-19 DIAGNOSIS — Z8673 Personal history of transient ischemic attack (TIA), and cerebral infarction without residual deficits: Secondary | ICD-10-CM

## 2016-03-19 DIAGNOSIS — I350 Nonrheumatic aortic (valve) stenosis: Secondary | ICD-10-CM | POA: Diagnosis not present

## 2016-03-19 DIAGNOSIS — I6523 Occlusion and stenosis of bilateral carotid arteries: Secondary | ICD-10-CM

## 2016-03-19 DIAGNOSIS — I5032 Chronic diastolic (congestive) heart failure: Secondary | ICD-10-CM | POA: Diagnosis not present

## 2016-03-19 MED ORDER — FUROSEMIDE 40 MG PO TABS: ORAL_TABLET | ORAL | 1 refills | Status: DC

## 2016-03-19 MED ORDER — CLOPIDOGREL BISULFATE 75 MG PO TABS: 75.0000 mg | ORAL_TABLET | Freq: Every day | ORAL | 1 refills | Status: DC

## 2016-03-19 NOTE — Patient Instructions (Signed)
Medication Instructions:  Stop aspirin.  Labwork: None   Testing/Procedures: Your physician has requested that you have an echocardiogram. Echocardiography is a painless test that uses sound waves to create images of your heart. It provides your doctor with information about the size and shape of your heart and how well your heart's chambers and valves are working. This procedure takes approximately one hour. There are no restrictions for this procedure.  A few days before the appointment with Dr End in 3 months.   Follow-Up: Your physician recommends that you schedule a follow-up appointment in: 3 months with Dr End.       If you need a refill on your cardiac medications before your next appointment, please call your pharmacy.

## 2016-03-20 ENCOUNTER — Encounter: Payer: Self-pay | Admitting: Internal Medicine

## 2016-03-23 ENCOUNTER — Other Ambulatory Visit (HOSPITAL_COMMUNITY): Payer: Medicare Other

## 2016-03-29 ENCOUNTER — Other Ambulatory Visit (INDEPENDENT_AMBULATORY_CARE_PROVIDER_SITE_OTHER): Payer: Medicare Other

## 2016-03-29 DIAGNOSIS — Z79899 Other long term (current) drug therapy: Secondary | ICD-10-CM

## 2016-03-29 DIAGNOSIS — E784 Other hyperlipidemia: Secondary | ICD-10-CM | POA: Diagnosis not present

## 2016-03-29 DIAGNOSIS — R739 Hyperglycemia, unspecified: Secondary | ICD-10-CM

## 2016-03-29 DIAGNOSIS — E559 Vitamin D deficiency, unspecified: Secondary | ICD-10-CM | POA: Diagnosis not present

## 2016-03-29 DIAGNOSIS — E7849 Other hyperlipidemia: Secondary | ICD-10-CM

## 2016-03-29 LAB — CBC WITH DIFFERENTIAL/PLATELET
Basophils Absolute: 0 10*3/uL (ref 0.0–0.1)
Basophils Relative: 0.8 % (ref 0.0–3.0)
EOS ABS: 0.2 10*3/uL (ref 0.0–0.7)
Eosinophils Relative: 3.9 % (ref 0.0–5.0)
HEMATOCRIT: 44.2 % (ref 39.0–52.0)
Hemoglobin: 14.7 g/dL (ref 13.0–17.0)
LYMPHS PCT: 33.4 % (ref 12.0–46.0)
Lymphs Abs: 1.7 10*3/uL (ref 0.7–4.0)
MCHC: 33.2 g/dL (ref 30.0–36.0)
MCV: 105.9 fl — ABNORMAL HIGH (ref 78.0–100.0)
MONO ABS: 0.5 10*3/uL (ref 0.1–1.0)
Monocytes Relative: 9.7 % (ref 3.0–12.0)
Neutro Abs: 2.7 10*3/uL (ref 1.4–7.7)
Neutrophils Relative %: 52.2 % (ref 43.0–77.0)
Platelets: 101 10*3/uL — ABNORMAL LOW (ref 150.0–400.0)
RBC: 4.17 Mil/uL — AB (ref 4.22–5.81)
RDW: 13.6 % (ref 11.5–15.5)
WBC: 5.1 10*3/uL (ref 4.0–10.5)

## 2016-03-29 LAB — HEPATIC FUNCTION PANEL
ALT: 43 U/L (ref 0–53)
AST: 41 U/L — AB (ref 0–37)
Albumin: 3.7 g/dL (ref 3.5–5.2)
Alkaline Phosphatase: 70 U/L (ref 39–117)
BILIRUBIN DIRECT: 0.2 mg/dL (ref 0.0–0.3)
BILIRUBIN TOTAL: 1 mg/dL (ref 0.2–1.2)
TOTAL PROTEIN: 6.1 g/dL (ref 6.0–8.3)

## 2016-03-29 LAB — LIPID PANEL
CHOLESTEROL: 105 mg/dL (ref 0–200)
HDL: 40.2 mg/dL (ref >39.00)
LDL Cholesterol: 46 mg/dL (ref 0–99)
NONHDL: 65.13
Total CHOL/HDL Ratio: 3
Triglycerides: 97 mg/dL (ref 0.0–149.0)
VLDL: 19.4 mg/dL (ref 0.0–40.0)

## 2016-03-29 LAB — BASIC METABOLIC PANEL
BUN: 24 mg/dL — AB (ref 6–23)
CALCIUM: 9.1 mg/dL (ref 8.4–10.5)
CO2: 34 mEq/L — ABNORMAL HIGH (ref 19–32)
Chloride: 103 mEq/L (ref 96–112)
Creatinine, Ser: 1.42 mg/dL (ref 0.40–1.50)
GFR: 49.66 mL/min — AB (ref >60.00)
GLUCOSE: 92 mg/dL (ref 70–99)
Potassium: 4.4 mEq/L (ref 3.5–5.1)
Sodium: 141 mEq/L (ref 135–145)

## 2016-03-29 LAB — VITAMIN D 25 HYDROXY (VIT D DEFICIENCY, FRACTURES): VITD: 38.91 ng/mL (ref 30.00–100.00)

## 2016-03-29 LAB — HEMOGLOBIN A1C: Hgb A1c MFr Bld: 5.5 % (ref 4.6–6.5)

## 2016-04-02 ENCOUNTER — Encounter: Payer: Medicare Other | Admitting: Family Medicine

## 2016-04-09 ENCOUNTER — Encounter: Payer: Self-pay | Admitting: Family Medicine

## 2016-04-09 ENCOUNTER — Ambulatory Visit (INDEPENDENT_AMBULATORY_CARE_PROVIDER_SITE_OTHER): Payer: Medicare Other | Admitting: Family Medicine

## 2016-04-09 VITALS — BP 124/56 | HR 53 | Temp 98.5°F | Ht 68.0 in | Wt 181.2 lb

## 2016-04-09 DIAGNOSIS — I639 Cerebral infarction, unspecified: Secondary | ICD-10-CM | POA: Diagnosis not present

## 2016-04-09 DIAGNOSIS — I209 Angina pectoris, unspecified: Secondary | ICD-10-CM | POA: Diagnosis not present

## 2016-04-09 DIAGNOSIS — Z951 Presence of aortocoronary bypass graft: Secondary | ICD-10-CM

## 2016-04-09 DIAGNOSIS — H3530 Unspecified macular degeneration: Secondary | ICD-10-CM

## 2016-04-09 DIAGNOSIS — N183 Chronic kidney disease, stage 3 unspecified: Secondary | ICD-10-CM

## 2016-04-09 DIAGNOSIS — E7800 Pure hypercholesterolemia, unspecified: Secondary | ICD-10-CM | POA: Diagnosis not present

## 2016-04-09 DIAGNOSIS — I10 Essential (primary) hypertension: Secondary | ICD-10-CM

## 2016-04-09 DIAGNOSIS — Z7189 Other specified counseling: Secondary | ICD-10-CM | POA: Insufficient documentation

## 2016-04-09 DIAGNOSIS — J449 Chronic obstructive pulmonary disease, unspecified: Secondary | ICD-10-CM

## 2016-04-09 DIAGNOSIS — I5032 Chronic diastolic (congestive) heart failure: Secondary | ICD-10-CM | POA: Diagnosis not present

## 2016-04-09 NOTE — Progress Notes (Signed)
Pre visit review using our clinic review tool, if applicable. No additional management support is needed unless otherwise documented below in the visit note. 

## 2016-04-09 NOTE — Progress Notes (Signed)
Dr. Frederico Hamman T. Lavaun Greenfield, MD, [facility] [place], [address] Phone: [phone] Fax: [phone]  04/09/2016  Patient: Andrew [name], MRN: [id number], DOB: 03-07-1923, 83 y.o.  Primary Physician:  [name], MD   Chief Complaint  Patient presents with  . Follow-up    6 month  . Forms    for [facility]   Subjective:   JOUSHUA DUGAR is a 31 y.o. very pleasant male patient who presents with the following:  6 month follow-up from multiple medical problems including right sided stroke, coronary disease, history of CABG, diastolic heart failure, chronic kidney disease stage III, COPD, as well as macular degeneration and hypertension.  He is globally doing well, he doesn't have any complaints at all with the exception of the continued loss of his eyesight which is very frustrating to him.  Past Medical History, Surgical History, Social History, Family History, Problem List, Medications, and Allergies have been reviewed and updated if relevant.  Patient Active Problem List   Diagnosis Date Noted  . Right-sided cerebrovascular accident (CVA) [facility])     Priority: High  . Chronic diastolic CHF (congestive heart failure) ([place]) 08/19/2012    Priority: High  . PVD- moderate carotid disease 03/07/2010    Priority: High  . Angina, class II ([place]) 06/18/2008    Priority: High  . Hx of CABG 06/18/2008    Priority: High  . Moderate aortic stenosis 06/18/2008    Priority: High  . Chronic renal insufficiency, stage III (moderate) 06/02/2014    Priority: Medium  . Macular degeneration (senile) of retina 04/29/2014    Priority: Medium  . TIA (transient ischemic attack) 06/03/2013    Priority: Medium  . COPD, minimal-mild 12/22/2010    Priority: Medium  . Spinal stenosis, lumbar region, with neurogenic claudication 07/13/2009    Priority: Medium  . Essential hypertension   . Alcohol abuse   . Thrombocytopenia ([place])   .  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 Major depressive disorder, single episode 06/11/2013  . Pancreatic cyst 01/29/2013  . OSA (obstructive sleep apnea) 12/26/2012  . S/P excision of acoustic neuroma 06/18/2010  . Pulmonary nodule 06/17/2010  . Vertigo 06/14/2010  . THYROID NODULE 03/13/2010  . ALLERGIC RHINITIS 09/01/2008  . COLONIC POLYPS, HX OF 09/01/2008  . HYPERCHOLESTEROLEMIA 06/18/2008  . GASTROESOPHAGEAL REFLUX DISEASE 06/18/2008    Past Medical History:  Diagnosis Date  . Allergic rhinitis   . AORTIC STENOSIS   . CAD, ARTERY BYPASS GRAFT   . CAROTID ARTERY STENOSIS 03/07/2010   80%  . CHF (congestive heart failure) ([place])   . COPD, mild ([place]) 12/22/2010  . GASTROESOPHAGEAL REFLUX DISEASE   . History of colonic polyps    1999, 2004  . History of prostate cancer 2002   s/p treatment with seeds / radiation  . HYPERCHOLESTEROLEMIA   . Lumbar spinal stenosis 07/13/2009  . Macular degeneration (senile) of retina 04/29/2014  . Pancreatic cyst 01/29/2013   Noted on MRI scan from [facility] on 07/27/2011.  I reviewed report at patient request on 01-29-2013.  "Small cystic focus in the posterior pancreatic head.  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  Follow up MRI is advised. The main pancreatic duct remains normal in appearance."  Patient do  . S/P excision of acoustic neuroma   . Skin cancer   . Stroke ([place])   .  Thyroid nodule 05/2010   Abnormal biopsy, 83 year old patient and his wife have made informed decision to not pursue surgical resection. Potential risk including cancer has been thoroughly discussed with the patient.    Past Surgical History:  Procedure Laterality Date  . CATARACT EXTRACTION    . CATARACT EXTRACTION  2010  . CHOLECYSTECTOMY    . CORONARY ARTERY BYPASS GRAFT    . CRANIECTOMY FOR EXCISION OF ACOUSTIC NEUROMA  1985  . EYE SURGERY    . SHOULDER SURGERY     . TONSILLECTOMY AND ADENOIDECTOMY  1932  . TOTAL KNEE ARTHROPLASTY    . transperineal implatation of palladium      Social History   Social History  . Marital status: Married    Spouse name: N/A  . Number of children: N/A  . Years of education: N/A   Occupational History  . retired  At [employer]    and [place] Topics  . Smoking status: Former Smoker    Packs/day: 2.00    Years: 20.00    Types: Cigarettes    Quit date: 01/22/1961  . Smokeless tobacco: Never Used  . Alcohol use 4.2 oz/week    7 Shots of liquor per week     Comment: 1 drink nightly  . Drug use: No  . Sexual activity: No   Other Topics Concern  . Not on file   Social History Narrative  . No narrative on file    Family History  Problem Relation Age of Onset  . Emphysema Brother   . Lung cancer Brother   . Heart disease Father   . Hypertension Father   . Heart attack Father   . Colon cancer Sister   . Non-Hodgkin's lymphoma Sister   . Heart disease Brother   . Colon cancer Sister   . Skin cancer Sister   . Alcohol abuse    . Arthritis    . Cancer    . Macular degeneration    . Lung cancer Daughter     No Known Allergies  Medication list reviewed and updated in full in [facility].   GEN: No acute illnesses, no fevers, chills. GI: No n/v/d, eating normally Pulm: No SOB Interactive and getting along well at home.  Otherwise, ROS is as per the HPI.  Objective:   BP (!) 124/56   Pulse (!) 53   Temp 98.5 F (36.9 C) (Oral)   Ht 5\' 8"  (1.727 m)   Wt 181 lb 4 oz (82.2 kg)   BMI 27.56 kg/m   GEN: WDWN, NAD, Non-toxic, A & O x 3 HEENT: Atraumatic, Normocephalic. Neck supple. No masses, No LAD. Ears and Nose: No external deformity. CV: RRR, No M/G/R. No JVD. No thrill. No extra heart sounds. PULM: CTA B, no wheezes, crackles, rhonchi. No retractions. No resp. distress. No accessory muscle use. EXTR: No c/c/e NEURO Normal gait.  PSYCH: Normally  interactive. Conversant. Not depressed or anxious appearing.  Calm demeanor.   Laboratory and Imaging Data: Results for orders placed or performed in visit on 03/29/16  Lipid panel  Result Value Ref Range   Cholesterol 105 0 - 200 mg/dL   Triglycerides 97.0 0.0 - 149.0 mg/dL   HDL 40.20 >39.00 mg/dL   VLDL 19.4 0.0 - 40.0 mg/dL   LDL Cholesterol 46 0 - 99 mg/dL   Total CHOL/HDL Ratio 3    NonHDL 65.13   CBC with Differential/Platelet  Result Value  Ref Range   WBC 5.1 4.0 - 10.5 K/uL   RBC 4.17 (L) 4.22 - 5.81 Mil/uL   Hemoglobin 14.7 13.0 - 17.0 g/dL   HCT 44.2 39.0 - 52.0 %   MCV 105.9 (H) 78.0 - 100.0 fl   MCHC 33.2 30.0 - 36.0 g/dL   RDW 13.6 11.5 - 15.5 %   Platelets 101.0 (L) 150.0 - 400.0 K/uL   Neutrophils Relative % 52.2 43.0 - 77.0 %   Lymphocytes Relative 33.4 12.0 - 46.0 %   Monocytes Relative 9.7 3.0 - 12.0 %   Eosinophils Relative 3.9 0.0 - 5.0 %   Basophils Relative 0.8 0.0 - 3.0 %   Neutro Abs 2.7 1.4 - 7.7 K/uL   Lymphs Abs 1.7 0.7 - 4.0 K/uL   Monocytes Absolute 0.5 0.1 - 1.0 K/uL   Eosinophils Absolute 0.2 0.0 - 0.7 K/uL   Basophils Absolute 0.0 0.0 - 0.1 K/uL  Hepatic function panel  Result Value Ref Range   Total Bilirubin 1.0 0.2 - 1.2 mg/dL   Bilirubin, Direct 0.2 0.0 - 0.3 mg/dL   Alkaline Phosphatase 70 39 - 117 U/L   AST 41 (H) 0 - 37 U/L   ALT 43 0 - 53 U/L   Total Protein 6.1 6.0 - 8.3 g/dL   Albumin 3.7 3.5 - 5.2 g/dL  Basic metabolic panel  Result Value Ref Range   Sodium 141 135 - 145 mEq/L   Potassium 4.4 3.5 - 5.1 mEq/L   Chloride 103 96 - 112 mEq/L   CO2 34 (H) 19 - 32 mEq/L   Glucose, Bld 92 70 - 99 mg/dL   BUN 24 (H) 6 - 23 mg/dL   Creatinine, Ser 1.42 0.40 - 1.50 mg/dL   Calcium 9.1 8.4 - 10.5 mg/dL   GFR 49.66 (L) >60.00 mL/min  Hemoglobin A1c  Result Value Ref Range   Hgb A1c MFr Bld 5.5 4.6 - 6.5 %  VITAMIN D 25 Hydroxy (Vit-D Deficiency, Fractures)  Result Value Ref Range   VITD 38.91 30.00 - 100.00 ng/mL      Assessment and Plan:   Right-sided cerebrovascular accident (CVA) (H[facility])  Hx of CABG  Angina, class II (HCC)  Chronic diastolic CHF (congestive heart failure) (H[facility])  Chronic renal insufficiency, stage III (moderate)  COPD, minimal-mild  Macular degeneration (senile) of retina  HYPERCHOLESTEROLEMIA  Essential hypertension  Advanced Planning: DNR   Globally doing well. Frustrated with his continued loss of eyesight. Frustrated some with his pain and back pain at age 35, but he does still retain excellent mental clarity.  Additional time spent in completing paperwork for the [facility] with the patient and his wife.  Verbally confirmed with the patient that he is DO NOT RESUSCITATE status and with his wife who is his power of attorney.  Follow-up: 6 mo medicare wellness  Signed,  Frederico Hamman T. Chayah Mckee, MD   Allergies as of 04/09/2016   No Known Allergies     Medication List       Accurate as of 04/09/16  4:13 PM. Always use your most recent med list.          acetaminophen 500 MG tablet Commonly known as:  TYLENOL Take 500 mg by mouth every 6 (six) hours as needed (pain).   albuterol 108 (90 Base) MCG/ACT inhaler Commonly known as:  PROVENTIL HFA;VENTOLIN HFA Inhale 2 puffs into the lungs every 6 (six) hours as needed for wheezing or shortness of breath.   cholecalciferol 1000 units  tablet Commonly known as:  VITAMIN D Take 1,000 Units by mouth daily.   clopidogrel 75 MG tablet Commonly known as:  PLAVIX Take 1 tablet (75 mg total) by mouth daily.   FLUoxetine 10 MG tablet Commonly known as:  PROZAC Take 10 mg by mouth daily.   furosemide 40 MG tablet Commonly known as:  LASIX Take two (2) tablets (80 mg total) by mouth each morning. Take one (1) tablet (40 mg total) by mouth each afternoon.   isosorbide mononitrate 120 MG 24 hr tablet Commonly known as:  IMDUR Take 1 tablet (120 mg total) by mouth daily.   metoprolol succinate 25 MG 24 hr  tablet Commonly known as:  TOPROL-XL Take 25 mg by mouth daily.   NEPHRO-VITE PO Take 1 tablet by mouth daily.   nitroGLYCERIN 0.4 MG SL tablet Commonly known as:  NITROSTAT Place 0.4 mg under the tongue every 5 (five) minutes as needed for chest pain (Max 3 doses with 15 minutes call 911).   OVER THE COUNTER MEDICATION Place 1 drop into both eyes daily as needed (dry eyes). OTC lubricating eye drop   potassium chloride 10 MEQ tablet Commonly known as:  K-DUR Take 2 tablets (20 mEq total) by mouth daily.   pravastatin 40 MG tablet Commonly known as:  PRAVACHOL Take 1 tablet (40 mg total) by mouth daily.   PRESERVISION/LUTEIN Caps Take 1 capsule by mouth 2 (two) times daily.   traMADol 50 MG tablet Commonly known as:  ULTRAM 1/2 to 1 tab by mouth Q 8 hours PRN pain

## 2016-04-19 ENCOUNTER — Other Ambulatory Visit: Payer: Self-pay | Admitting: Cardiology

## 2016-04-27 ENCOUNTER — Other Ambulatory Visit: Payer: Self-pay | Admitting: Family Medicine

## 2016-05-07 ENCOUNTER — Other Ambulatory Visit: Payer: Self-pay | Admitting: Cardiology

## 2016-05-16 ENCOUNTER — Encounter: Payer: Self-pay | Admitting: Family

## 2016-05-22 ENCOUNTER — Other Ambulatory Visit: Payer: Self-pay | Admitting: Surgery

## 2016-05-22 DIAGNOSIS — I6523 Occlusion and stenosis of bilateral carotid arteries: Secondary | ICD-10-CM

## 2016-05-28 ENCOUNTER — Ambulatory Visit (INDEPENDENT_AMBULATORY_CARE_PROVIDER_SITE_OTHER): Payer: Medicare Other | Admitting: Family

## 2016-05-28 ENCOUNTER — Ambulatory Visit (HOSPITAL_COMMUNITY)
Admission: RE | Admit: 2016-05-28 | Discharge: 2016-05-28 | Disposition: A | Discharge status: Home / Self Care | Payer: Medicare Other | Source: Ambulatory Visit | Attending: Family | Admitting: Family

## 2016-05-28 ENCOUNTER — Encounter: Payer: Self-pay | Admitting: Family

## 2016-05-28 VITALS — BP 124/65 | HR 50 | Temp 98.0°F | Resp 16 | Ht 68.0 in | Wt 187.0 lb

## 2016-05-28 DIAGNOSIS — I6523 Occlusion and stenosis of bilateral carotid arteries: Secondary | ICD-10-CM

## 2016-05-28 DIAGNOSIS — Z8673 Personal history of transient ischemic attack (TIA), and cerebral infarction without residual deficits: Secondary | ICD-10-CM

## 2016-05-28 LAB — VAS US CAROTID
LCCADDIAS: -5 cm/s
LCCADSYS: -46 cm/s
LCCAPDIAS: 9 cm/s
LEFT ECA DIAS: -4 cm/s
LEFT VERTEBRAL DIAS: 9 cm/s
LICADDIAS: -18 cm/s
Left CCA prox sys: 94 cm/s
Left ICA dist sys: -79 cm/s
RCCAPDIAS: 7 cm/s
RCCAPSYS: 63 cm/s
RIGHT CCA MID DIAS: 9 cm/s
RIGHT ECA DIAS: -3 cm/s
RIGHT VERTEBRAL DIAS: 9 cm/s
Right cca dist sys: -46 cm/s

## 2016-05-28 NOTE — Patient Instructions (Signed)
Stroke Prevention Some medical conditions and behaviors are associated with an increased chance of having a stroke. You may prevent a stroke by making healthy choices and managing medical conditions. How can I reduce my risk of having a stroke?  Stay physically active. Get at least 30 minutes of activity on most or all days.  Do not smoke. It may also be helpful to avoid exposure to secondhand smoke.  Limit alcohol use. Moderate alcohol use is considered to be:  No more than 2 drinks per day for men.  No more than 1 drink per day for nonpregnant women.  Eat healthy foods. This involves:  Eating 5 or more servings of fruits and vegetables a day.  Making dietary changes that address high blood pressure (hypertension), high cholesterol, diabetes, or obesity.  Manage your cholesterol levels.  Making food choices that are high in fiber and low in saturated fat, trans fat, and cholesterol may control cholesterol levels.  Take any prescribed medicines to control cholesterol as directed by your health care provider.  Manage your diabetes.  Controlling your carbohydrate and sugar intake is recommended to manage diabetes.  Take any prescribed medicines to control diabetes as directed by your health care provider.  Control your hypertension.  Making food choices that are low in salt (sodium), saturated fat, trans fat, and cholesterol is recommended to manage hypertension.  Ask your health care provider if you need treatment to lower your blood pressure. Take any prescribed medicines to control hypertension as directed by your health care provider.  If you are 18-39 years of age, have your blood pressure checked every 3-5 years. If you are 40 years of age or older, have your blood pressure checked every year.  Maintain a healthy weight.  Reducing calorie intake and making food choices that are low in sodium, saturated fat, trans fat, and cholesterol are recommended to manage  weight.  Stop drug abuse.  Avoid taking birth control pills.  Talk to your health care provider about the risks of taking birth control pills if you are over 35 years old, smoke, get migraines, or have ever had a blood clot.  Get evaluated for sleep disorders (sleep apnea).  Talk to your health care provider about getting a sleep evaluation if you snore a lot or have excessive sleepiness.  Take medicines only as directed by your health care provider.  For some people, aspirin or blood thinners (anticoagulants) are helpful in reducing the risk of forming abnormal blood clots that can lead to stroke. If you have the irregular heart rhythm of atrial fibrillation, you should be on a blood thinner unless there is a good reason you cannot take them.  Understand all your medicine instructions.  Make sure that other conditions (such as anemia or atherosclerosis) are addressed. Get help right away if:  You have sudden weakness or numbness of the face, arm, or leg, especially on one side of the body.  Your face or eyelid droops to one side.  You have sudden confusion.  You have trouble speaking (aphasia) or understanding.  You have sudden trouble seeing in one or both eyes.  You have sudden trouble walking.  You have dizziness.  You have a loss of balance or coordination.  You have a sudden, severe headache with no known cause.  You have new chest pain or an irregular heartbeat. Any of these symptoms may represent a serious problem that is an emergency. Do not wait to see if the symptoms will go away.   Get medical help at once. Call your local emergency services (911 in [place] Do not drive yourself to the hospital. This information is not intended to replace advice given to you by your health care provider. Make sure you discuss any questions you have with your health care provider. Document Released: 02/16/2004 Document Revised: 06/16/2015 Document Reviewed: 07/11/2012 Elsevier  Interactive Patient Education  2017 Elsevier Inc.  

## 2016-05-28 NOTE — Progress Notes (Signed)
Chief Complaint: Follow up Extracranial Carotid Artery Stenosis   History of Present Illness  Andrew [name] is a 83 y.o. male patient of [name] who returns for follow up of known extracranial carotid artery stenosis.  Patient has not had previous carotid artery intervention.  He did have several episodes of global amnesia, states [name] assured him this was not a stroke or TIA.  Wife states pt had a stroke in his right eye at the early part of October 2017, states this was determined on October 28, 2015 by Dr. George Ina (sp?), [place].   Pt states his left eye has lost most of the vision due to macular degeneration.   Dr. 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 Vascular risk factors of hypertension hyperlipidemia, age and sex. Mild memory difficulties due to age-related mild cognitive impairment.  This stroke manifested as mild left arm and leg weakness. He has received physical therapy and is exercising at home daily.   He fell backwards on stairs June 2016, was evaluated at an urgent care center. Wife states he has confirmed vertebral fracture of L3. Since this fall his right leg occasionally "feels like rubber".  Pt states he has spinal stenosis and receives injections in his back to help this, he does not seem to have claudication symptoms, denies non healing wounds. He avoids activity that brings on chest pain and has not had to use NTG as much. Dr. Aundra Dubin is his cardiologist. He "gets winded pretty quick". He states he has a hx of a mild MI.   He reports left side facial droop since left acoustic neuroma excised in 1985.  He uses a recumbent bike daily for 20 minutes.   He states his dyspnea and cough are no worse than usual.   Pt Diabetic: No Pt smoker: former smoker, quit in 1963  Pt meds include: Statin : Yes ASA:  No Other anticoagulants/antiplatelets: Plavix   Past Medical History:  Diagnosis Date  . Allergic rhinitis   . AORTIC STENOSIS   . CAD, ARTERY BYPASS GRAFT   . CAROTID ARTERY STENOSIS 03/07/2010   80%  . CHF (congestive heart failure) ([place])   . COPD, mild ([place]) 12/22/2010  . GASTROESOPHAGEAL REFLUX DISEASE   . History of colonic polyps    1999, 2004  . History of prostate cancer 2002   s/p treatment with seeds / radiation  . HYPERCHOLESTEROLEMIA   . Lumbar spinal stenosis 07/13/2009  . Macular degeneration (senile) of retina 04/29/2014  . Pancreatic cyst 01/29/2013   Noted on MRI scan from [facility] on 07/27/2011.  I reviewed report at patient request on 01-29-2013.  "Small cystic focus in the posterior pancreatic head.  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  Follow up MRI is advised. The main pancreatic duct remains normal in appearance."  Patient do  . S/P excision of acoustic neuroma   . Skin cancer   . Stroke ([place])   . Thyroid nodule 05/2010   Abnormal biopsy, 83 year old patient and his wife have made informed decision to not pursue surgical resection. Potential risk including cancer has been thoroughly discussed with the patient.    Social History Social History  Substance Use Topics  . Smoking status: Former Smoker    Packs/day: 2.00  Years: 20.00    Types: Cigarettes    Quit date: 01/22/1961  . Smokeless tobacco: Never Used  . Alcohol use 4.2 oz/week    7 Shots of liquor per week     Comment: 1 drink nightly    Family History Family History  Problem Relation Age of Onset  . Emphysema Brother   . Lung cancer Brother   . Heart disease Father   . Hypertension Father   . Heart attack Father   . Colon cancer Sister   . Non-Hodgkin's lymphoma Sister   . Heart disease Brother   . Colon cancer Sister   . Skin cancer Sister   . Alcohol abuse    . Arthritis     . Cancer    . Macular degeneration    . Lung cancer Daughter     Surgical History Past Surgical History:  Procedure Laterality Date  . CATARACT EXTRACTION    . CATARACT EXTRACTION  2010  . CHOLECYSTECTOMY    . CORONARY ARTERY BYPASS GRAFT    . CRANIECTOMY FOR EXCISION OF ACOUSTIC NEUROMA  1985  . EYE SURGERY    . SHOULDER SURGERY    . TONSILLECTOMY AND ADENOIDECTOMY  1932  . TOTAL KNEE ARTHROPLASTY    . transperineal implatation of palladium      No Known Allergies  Current Outpatient Prescriptions  Medication Sig Dispense Refill  . acetaminophen (TYLENOL) 500 MG tablet Take 500 mg by mouth every 6 (six) hours as needed (pain).    Marland Kitchen albuterol (PROVENTIL HFA;VENTOLIN HFA) 108 (90 BASE) MCG/ACT inhaler Inhale 2 puffs into the lungs every 6 (six) hours as needed for wheezing or shortness of breath. 1 Inhaler 3  . B Complex-C-Folic Acid (NEPHRO-VITE PO) Take 1 tablet by mouth daily.      . cholecalciferol (VITAMIN D) 1000 units tablet Take 1,000 Units by mouth daily.    . clopidogrel (PLAVIX) 75 MG tablet Take 1 tablet (75 mg total) by mouth daily. 90 tablet 1  . FLUoxetine (PROZAC) 10 MG tablet TAKE 1 TABLET DAILY 90 tablet 1  . furosemide (LASIX) 40 MG tablet Take two (2) tablets (80 mg total) by mouth each morning. Take one (1) tablet (40 mg total) by mouth each afternoon. 270 tablet 1  . isosorbide mononitrate (IMDUR) 120 MG 24 hr tablet Take 1 tablet (120 mg total) by mouth daily. 90 tablet 1  . metoprolol succinate (TOPROL-XL) 25 MG 24 hr tablet Take 25 mg by mouth daily.    . Multiple Vitamins-Minerals (PRESERVISION/LUTEIN) CAPS Take 1 capsule by mouth 2 (two) times daily.    . nitroGLYCERIN (NITROSTAT) 0.4 MG SL tablet Place 0.4 mg under the tongue every 5 (five) minutes as needed for chest pain (Max 3 doses with 15 minutes call 911).    [place]land Kitchen OVER THE COUNTER MEDICATION Place 1 drop into both eyes daily as needed (dry eyes). OTC lubricating eye drop    . potassium chloride  (K-DUR) 10 MEQ tablet Take 2 tablets (20 mEq total) by mouth daily. 180 tablet 1  . potassium chloride (K-DUR,KLOR-CON) 10 MEQ tablet TAKE 2 TABLETS (20 MEQ TOTAL) BY MOUTH DAILY. 180 tablet 0  . pravastatin (PRAVACHOL) 40 MG tablet TAKE 1 TABLET DAILY  ( CALL TO SCHEDULE AN 6 MONTHS APPOINTMENT FOR FUTURE REFILLS ) 90 tablet 3  . traMADol (ULTRAM) 50 MG tablet 1/2 to 1 tab by mouth Q 8 hours PRN pain 40 tablet 0   No current facility-administered medications for this visit.  Review of Systems : See HPI for pertinent positives and negatives.  Physical Examination  Vitals:   05/28/16 1535 05/28/16 1537  BP: 132/71 124/65  Pulse: (!) 50   Resp: 16   Temp: 98 F (36.7 C)   TempSrc: Oral   SpO2: 96%   Weight: 187 lb (84.8 kg)   Height: 5\' 8"  (1.727 m)    Body mass index is 28.43 kg/m.  General: WDWN male in NAD GAIT:using a cane Eyes: PERRLA Pulmonary: Respirations are slightly labored at rest, adequate air movement, few rales in bilateral bases.   Cardiac: regular rhythm, bradycardic (taking a beta blocker),+ murmur.  VASCULAR EXAM Carotid Bruits Right Left   positive Positive   Aorta is not palpable. Radial pulses are 2+ palpable and equal.      LE Pulses Right Left   POPLITEAL not palpable  not palpable   POSTERIOR TIBIAL  faintlypalpable  not palpable    DORSALIS PEDIS  ANTERIOR TIBIAL 1+ palpable   not palpable     Gastrointestinal: soft, nontender, BS WNL, no r/g,no palpable masses.  Musculoskeletal: No muscle atrophy/wasting. M/S 5/5 throughout, Extremities without ischemic changes. Both feet are pink and warm, all toes are also pink and warm with brisk capillary refill.   Neurologic: A&O X 3; Appropriate Affect, Speech is normal CN 2-12 intact except is hard  of hearing, Pain and light touch intact in extremities, Motor exam as listed above.     Assessment: Andrew [name] is a 83 y.o. 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 He has residual mild dragging of the left leg.  At pt's 11-17-15 visit,  I discussed with Dr. Oneida Alar re pt and wife report that his ophthalmologist told them that he had a stroke in his right eye, diagnosed early that month. Pt's reports left eye vision is diminished secondary to macular degeneration. Pt reports left side facial droop since left acoustic neuroma excised in 1985. Dr. Oneida Alar indicated that an ophthalmologist may see emboli on the retinal exam, which may or may not indicate that the pt had an occular stroke.  Since he has <40% stenosis in his right extracranial internal carotid artery, there is no intervention on his right extracranial internal carotid artery indicated that will lower his risk of a future TIA or stroke.  He has no new neurological symptoms that correspond to the left brain.   DATA Today's carotid Duplex suggests <40% right ICA stenosis and 60 - 79 % left ICA stenosis. Bilateral vertebral artery flow is antegrade.  Bilateral subclavian artery waveforms are normal.  No significant change compared to exam dating back to January 2013, and also last exam on 11-17-15.   Plan:  Follow-up in 9 months with Carotid Duplex scan.  I discussed in depth with the patient the nature of atherosclerosis, and emphasized the importance of maximal medical management including strict control of blood pressure, blood glucose, and lipid levels, obtaining regular exercise, and continued cessation of smoking.  The patient is aware that without maximal medical management the underlying atherosclerotic disease process will progress, limiting the benefit of any interventions. The patient was given information about stroke prevention and what symptoms should  prompt the patient to seek immediate medical care. Thank you for allowing [place] to participate in this patient's care.  Clemon Chambers, RN, MSN, FNP-C [facility] Office: [facility] Physician: Trula Slade  05/28/16 3:40 PM

## 2016-05-31 NOTE — Addendum Note (Signed)
Addended by: Lianne Cure A on: 05/31/2016 03:54 PM   Modules accepted: Orders

## 2016-06-21 ENCOUNTER — Other Ambulatory Visit: Payer: Self-pay

## 2016-06-21 ENCOUNTER — Ambulatory Visit (HOSPITAL_COMMUNITY): Payer: Medicare Other | Attending: Internal Medicine

## 2016-06-21 DIAGNOSIS — I350 Nonrheumatic aortic (valve) stenosis: Secondary | ICD-10-CM

## 2016-06-21 DIAGNOSIS — I080 Rheumatic disorders of both mitral and aortic valves: Secondary | ICD-10-CM | POA: Diagnosis not present

## 2016-06-21 DIAGNOSIS — I5032 Chronic diastolic (congestive) heart failure: Secondary | ICD-10-CM | POA: Diagnosis not present

## 2016-06-28 ENCOUNTER — Ambulatory Visit (INDEPENDENT_AMBULATORY_CARE_PROVIDER_SITE_OTHER): Payer: Medicare Other | Admitting: Internal Medicine

## 2016-06-28 ENCOUNTER — Encounter: Payer: Self-pay | Admitting: Internal Medicine

## 2016-06-28 VITALS — BP 126/58 | HR 62 | Ht 68.0 in | Wt 184.0 lb

## 2016-06-28 DIAGNOSIS — I5032 Chronic diastolic (congestive) heart failure: Secondary | ICD-10-CM | POA: Diagnosis not present

## 2016-06-28 DIAGNOSIS — I25118 Atherosclerotic heart disease of native coronary artery with other forms of angina pectoris: Secondary | ICD-10-CM | POA: Diagnosis not present

## 2016-06-28 DIAGNOSIS — E785 Hyperlipidemia, unspecified: Secondary | ICD-10-CM | POA: Diagnosis not present

## 2016-06-28 DIAGNOSIS — I2510 Atherosclerotic heart disease of native coronary artery without angina pectoris: Secondary | ICD-10-CM | POA: Insufficient documentation

## 2016-06-28 DIAGNOSIS — I350 Nonrheumatic aortic (valve) stenosis: Secondary | ICD-10-CM

## 2016-06-28 DIAGNOSIS — I6523 Occlusion and stenosis of bilateral carotid arteries: Secondary | ICD-10-CM | POA: Insufficient documentation

## 2016-06-28 MED ORDER — CLOPIDOGREL BISULFATE 75 MG PO TABS: 75.0000 mg | ORAL_TABLET | Freq: Every day | ORAL | 1 refills | Status: DC

## 2016-06-28 MED ORDER — FUROSEMIDE 40 MG PO TABS: ORAL_TABLET | ORAL | 1 refills | Status: DC

## 2016-06-28 NOTE — Patient Instructions (Signed)
Medication Instructions:  Decrease lasix (furosemide) to 40 mg (1 tablet) in the AM and 20 mg (1/2 tablet) in the PM.   Labwork: None   Testing/Procedures: None   Follow-Up: Your physician recommends that you schedule a follow-up appointment in: 3 months with Dr End.  Any Other Special Instructions Will Be Listed Below (If Applicable).  Your physician recommends that you weigh, daily, at the same time every day, and in the same amount of clothing. Please record your weights. If you gain more than 2 pounds in 1 day or 5 pounds in 1 week take lasix 40 mg in the PM instead of your usual 20 mg.      If you need a refill on your cardiac medications before your next appointment, please call your pharmacy.

## 2016-06-28 NOTE — Progress Notes (Signed)
Follow-up Outpatient Visit Date: 06/28/2016  Primary Care Provider: [name], MD [place] [address]  Chief Complaint: Follow-up coronary artery disease and aortic stenosis  HPI:  Mr. Cuffee is a 83 y.o. year-old male with history of CAD status post remote CABG, moderate aortic stenosis, chronic kidney disease, and stroke, who presents for follow-up of CAD and AS. I last saw him on 03/19/16, at which time he was doing relatively well. Today, his main complaint is of significant urinary frequency after taking furosemide, particularly in the afternoon. He often finds it difficult to make it to the bathroom in time to void. He is currently taking furosemide 40 mg twice a day. He has not had any significant edema or weight gain. He has stable exertional angina and is currently using nitroglycerin spray once a week. He does not have any significant dyspnea, orthopnea, or PND. He also denies palpitations and lightheadedness. He has been compliant with his medications. He and his wife are planning to move to friends home next month, which has been stressful for both of them. Despite this, Mr. 6 continues to ride his recumbent bicycle 20 minutes a day without difficulty.  --------------------------------------------------------------------------------------------------  Cardiovascular History & Procedures: Cardiovascular Problems:  Coronary artery disease status post CABG in 1991  Moderate aortic stenosis  Ischemic cardiomyopathy  Risk Factors:  Known coronary artery disease, stroke, hyperlipidemia, male gender, and age greater than 39  Cath/PCI:  LHC (2004): One limb of sequential SVG to OM1, OM2, and OM3 occluded.  CV Surgery:  CABG (1991): LIMA to LAD, SVG to PDA, sequential SVG to OM1, OM2, and OM3.  EP Procedures and Devices:  None  Non-Invasive Evaluation(s):  TTE (06/21/16): Mildly dilated left ventricle with mild LVH. LVEF 55-60%. Thickened  aortic valve with moderately restricted motion. Mean gradient 22 mmHg. Mild aortic regurgitation and mitral regurgitation. Mild left atrial enlargement. Moderate pulmonic regurgitation. Mild pulmonary hypertension. No significant change from prior echo in 2017.  Carotid artery Doppler (05/28/16): 60-79% left and less than 40% right internal carotid artery stenoses.  Recent CV Pertinent Labs: Lab Results  Component Value Date   CHOL 105 03/29/2016   HDL 40.20 03/29/2016   LDLCALC 46 03/29/2016   LDLDIRECT 67.0 03/13/2010   TRIG 97.0 03/29/2016   CHOLHDL 3 03/29/2016   INR 1.11 06/20/2015   K 4.4 03/29/2016   BUN 24 (H) 03/29/2016   CREATININE 1.42 03/29/2016    Past medical and surgical history were reviewed and updated in EPIC.  Outpatient Encounter Prescriptions as of 06/28/2016  Medication Sig  . acetaminophen (TYLENOL) 500 MG tablet Take 500 mg by mouth every 6 (six) hours as needed (pain).  Marland Kitchen albuterol (PROVENTIL HFA;VENTOLIN HFA) 108 (90 BASE) MCG/ACT inhaler Inhale 2 puffs into the lungs every 6 (six) hours as needed for wheezing or shortness of breath.  . B Complex-C-Folic Acid (NEPHRO-VITE PO) Take 1 tablet by mouth daily.    . cholecalciferol (VITAMIN D) 1000 units tablet Take 1,000 Units by mouth daily.  . clopidogrel (PLAVIX) 75 MG tablet Take 1 tablet (75 mg total) by mouth daily.  [name] 10 MG tablet TAKE 1 TABLET DAILY  . furosemide (LASIX) 40 MG tablet Take two (2) tablets (80 mg total) by mouth each morning. Take one (1) tablet (40 mg total) by mouth each afternoon.  . isosorbide mononitrate (IMDUR) 120 MG 24 hr tablet Take 1 tablet (120 mg total) by mouth daily.  . metoprolol succinate (TOPROL-XL) 25  MG 24 hr tablet Take 25 mg by mouth daily.  . Multiple Vitamins-Minerals (PRESERVISION/LUTEIN) CAPS Take 1 capsule by mouth 2 (two) times daily.  . nitroGLYCERIN (NITROSTAT) 0.4 MG SL tablet Place 0.4 mg under the tongue every 5 (five) minutes as needed for  chest pain (Max 3 doses with 15 minutes call 911).  Marland Kitchen OVER THE COUNTER MEDICATION Place 1 drop into both eyes daily as needed (dry eyes). OTC lubricating eye drop  . potassium chloride (K-DUR) 10 MEQ tablet Take 2 tablets (20 mEq total) by mouth daily.  . potassium chloride (K-DUR,KLOR-CON) 10 MEQ tablet TAKE 2 TABLETS (20 MEQ TOTAL) BY MOUTH DAILY.  . pravastatin (PRAVACHOL) 40 MG tablet TAKE 1 TABLET DAILY  ( CALL TO SCHEDULE AN 6 MONTHS APPOINTMENT FOR FUTURE REFILLS )  . [DISCONTINUED] traMADol (ULTRAM) 50 MG tablet 1/2 to 1 tab by mouth Q 8 hours PRN pain (Patient not taking: Reported on 06/28/2016)   No facility-administered encounter medications on file as of 06/28/2016.     Allergies: Patient has no known allergies.  Social History   Social History  . Marital status: Married    Spouse name: N/A  . Number of children: N/A  . Years of education: N/A   Occupational History  . retired  At [employer]    and [place] Topics  . Smoking status: Former Smoker    Packs/day: 2.00    Years: 20.00    Types: Cigarettes    Quit date: 01/22/1961  . Smokeless tobacco: Never Used  . Alcohol use 4.2 oz/week    7 Shots of liquor per week     Comment: 1 drink nightly  . Drug use: No  . Sexual activity: No   Other Topics Concern  . Not on file   Social History Narrative  . No narrative on file    Family History  Problem Relation Age of Onset  . Emphysema Brother   . Lung cancer Brother   . Heart disease Father   . Hypertension Father   . Heart attack Father   . Colon cancer Sister   . Non-Hodgkin's lymphoma Sister   . Heart disease Brother   . Colon cancer Sister   . Skin cancer Sister   . Alcohol abuse Unknown   . Arthritis Unknown   . Cancer Unknown   . Macular degeneration Unknown   . Lung cancer Daughter     Review of Systems: A 12-system review of systems was performed and was negative except as noted in the  HPI.  --------------------------------------------------------------------------------------------------  Physical Exam: BP (!) 126/58   Pulse 62   Ht 5\' 8"  (1.727 m)   Wt 184 lb (83.5 kg)   SpO2 93%   BMI 27.98 kg/m   General:  Overweight, elderly man seated comfortably in the exam room. He is accompanied by his wife. HEENT: No conjunctival pallor or scleral icterus.  Moist mucous membranes.  OP clear. Neck: Supple without lymphadenopathy, thyromegaly, JVD, or HJR.  Soft bilateral carotid bruits. Lungs: Normal work of breathing.  Clear to auscultation bilaterally without wheezes or crackles. Heart: Regular rate and rhythm with 2/6 crescendo-decrescendo systolic murmur loudest at the right upper sternal border. No rubs or gallops.  Non-displaced PMI. Abd: Bowel sounds present.  Soft, NT/ND without hepatosplenomegaly Ext: No lower extremity edema.  Radial, PT, and DP pulses are 2+ bilaterally. Skin: warm and dry without rash  Lab Results  Component Value Date   WBC  5.1 03/29/2016   HGB 14.7 03/29/2016   HCT 44.2 03/29/2016   MCV 105.9 (H) 03/29/2016   PLT 101.0 (L) 03/29/2016    Lab Results  Component Value Date   NA 141 03/29/2016   K 4.4 03/29/2016   CL 103 03/29/2016   CO2 34 (H) 03/29/2016   BUN 24 (H) 03/29/2016   CREATININE 1.42 03/29/2016   GLUCOSE 92 03/29/2016   ALT 43 03/29/2016    Lab Results  Component Value Date   CHOL 105 03/29/2016   HDL 40.20 03/29/2016   LDLCALC 46 03/29/2016   LDLDIRECT 67.0 03/13/2010   TRIG 97.0 03/29/2016   CHOLHDL 3 03/29/2016    --------------------------------------------------------------------------------------------------  ASSESSMENT AND PLAN: Coronary artery disease with stable angina Anginal symptoms are unchanged. Mr. Mccaskill continues to use sublingual nitroglycerin about once a week for exertional chest pain. He is able to ride his recumbent bike for 20 minutes most days of the week without significant symptoms or  limitations. We will not make any changes to his current medication regimen. He will continue on clopidogrel and pravastatin for secondary prevention.  Moderate aortic stenosis Echocardiogram this year demonstrates stable moderate aortic stenosis. Mr. Raelyn Number does not have any worsening symptoms to suggest severe chest. We will continue clinical and echocardiographic monitoring.  Chronic diastolic heart failure Mr. Scioli appears euvolemic with NYHA class II symptoms. He is concerned about his urinary frequency, particularly later in the day. We have discussed timing of furosemide as well as decreasing his afternoon dose and switching to a once daily regimen. We have agreed to change his regimen to 40 mg every morning and 20 mg every afternoon. I have asked Mr. full to monitor his weight carefully. He should take 40 mg in the afternoon if he notices any more than 2 pound weight gain in a day or 5 pound weight gain in a week.  Carotid artery stenosis status post stroke No new neurologic symptoms. We will continue with clopidogrel and pravastatin. Aspirin was stopped just prior to last visit due to easy bruising. Mr. full continues to bruise easily. He should follow-up with vascular surgery, as previously arranged.  Hyperlipidemia Goal LDL is less than 70, which is easily achieved with current regimen of pravastatin. We will not make any changes today.  Follow-up: Return to clinic in 3 months.  Nelva Bush, MD 06/28/2016 8:13 PM

## 2016-07-19 IMAGING — DX DG RIBS W/ CHEST 3+V*L*
3 series · 3 of 3 positions shown · non-contrast
Comparison: 05/30/2013

CLINICAL DATA: Post fall hitting left ribs on deck.

EXAM:
LEFT RIBS AND CHEST - 3+ VIEW

[chest pa]
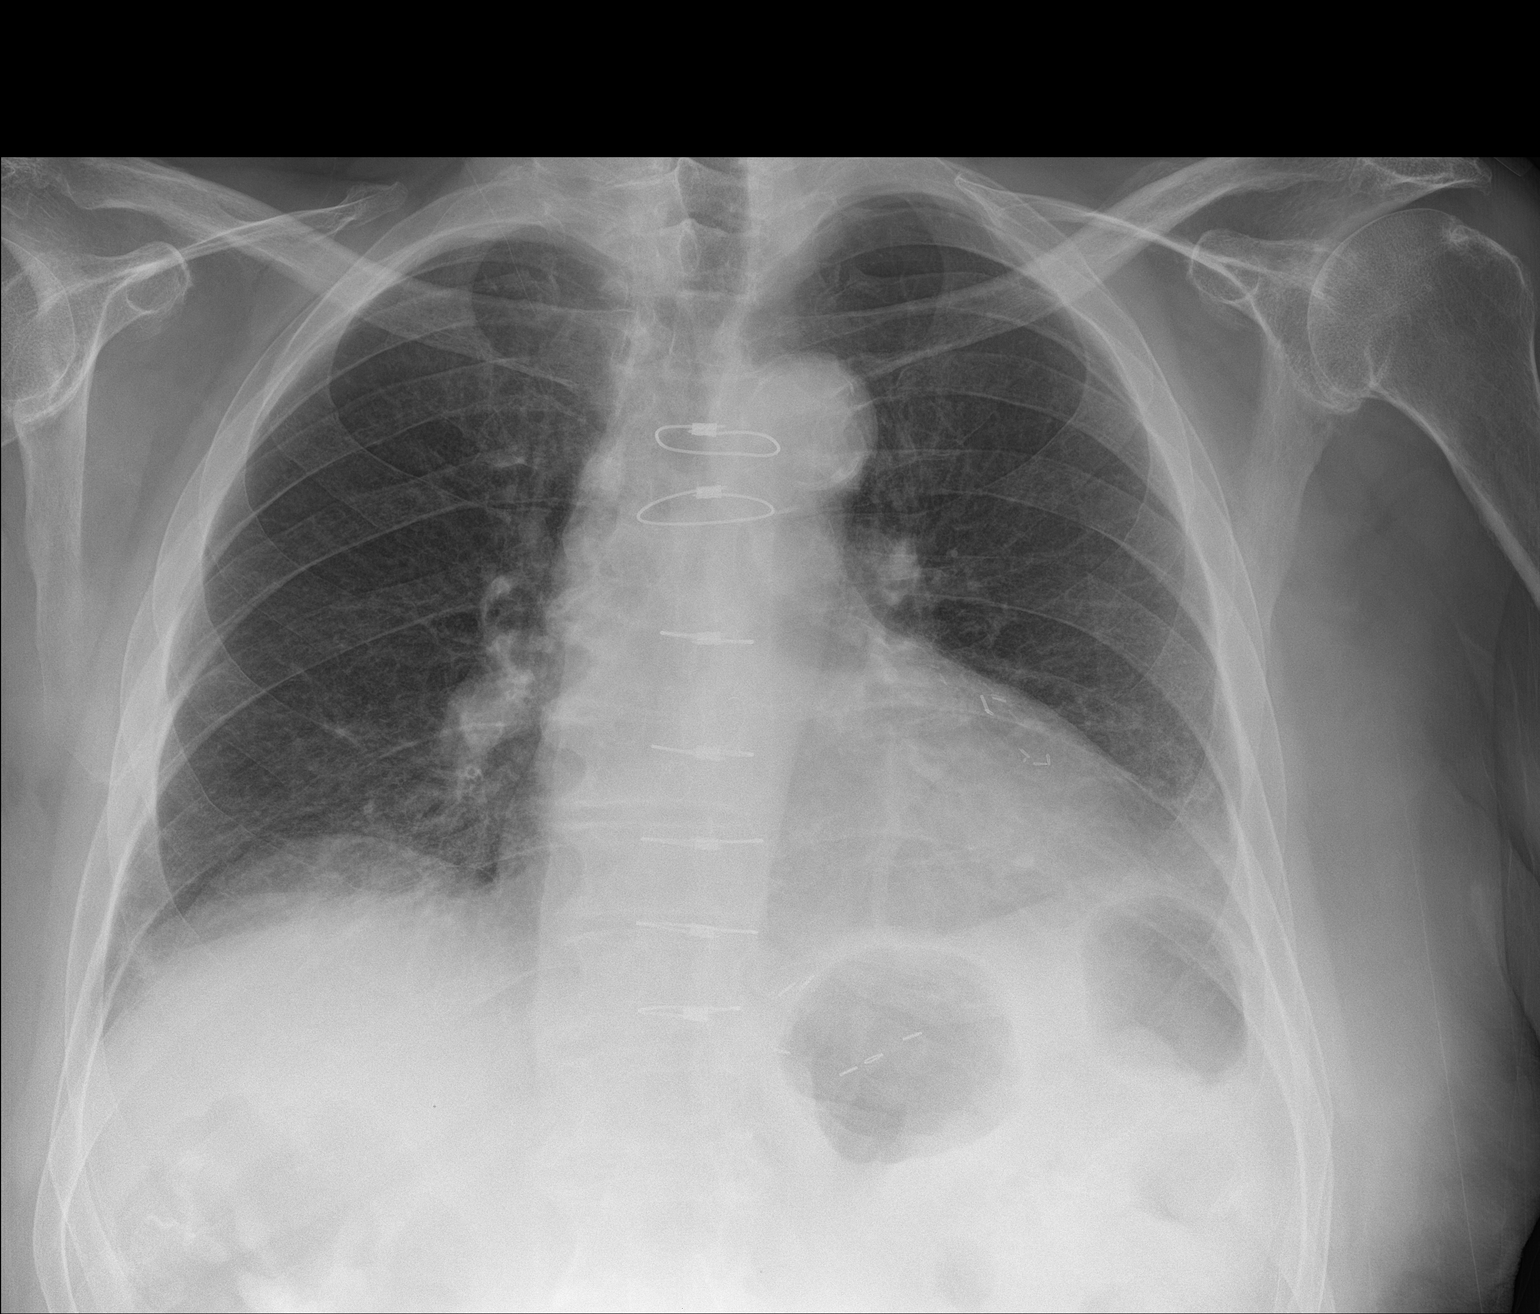

[rib obl]
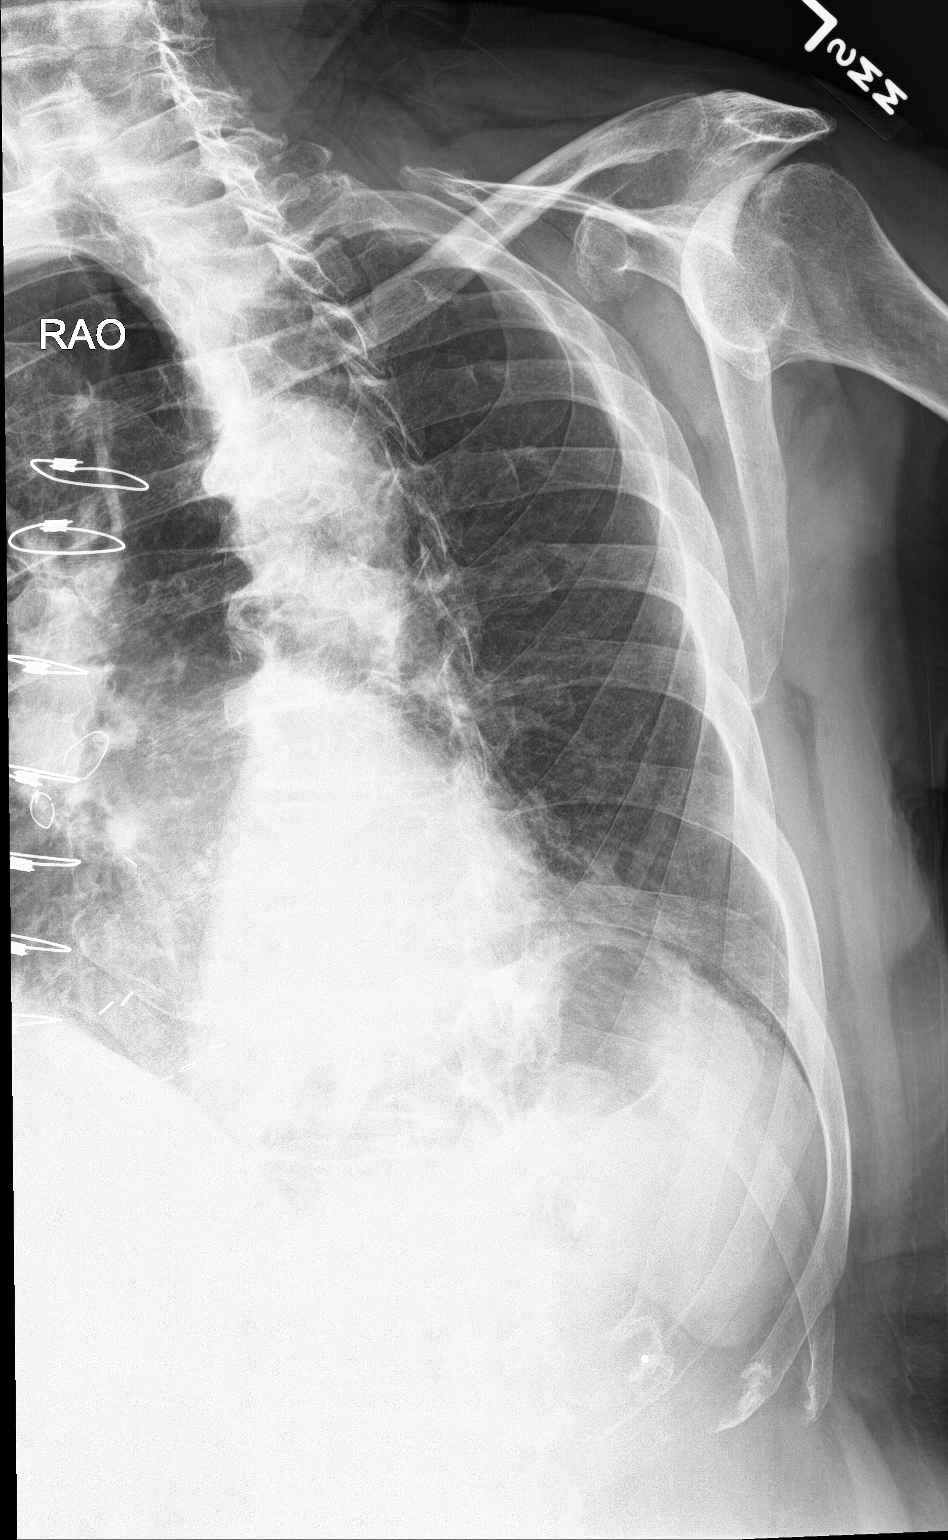

[rib pa]
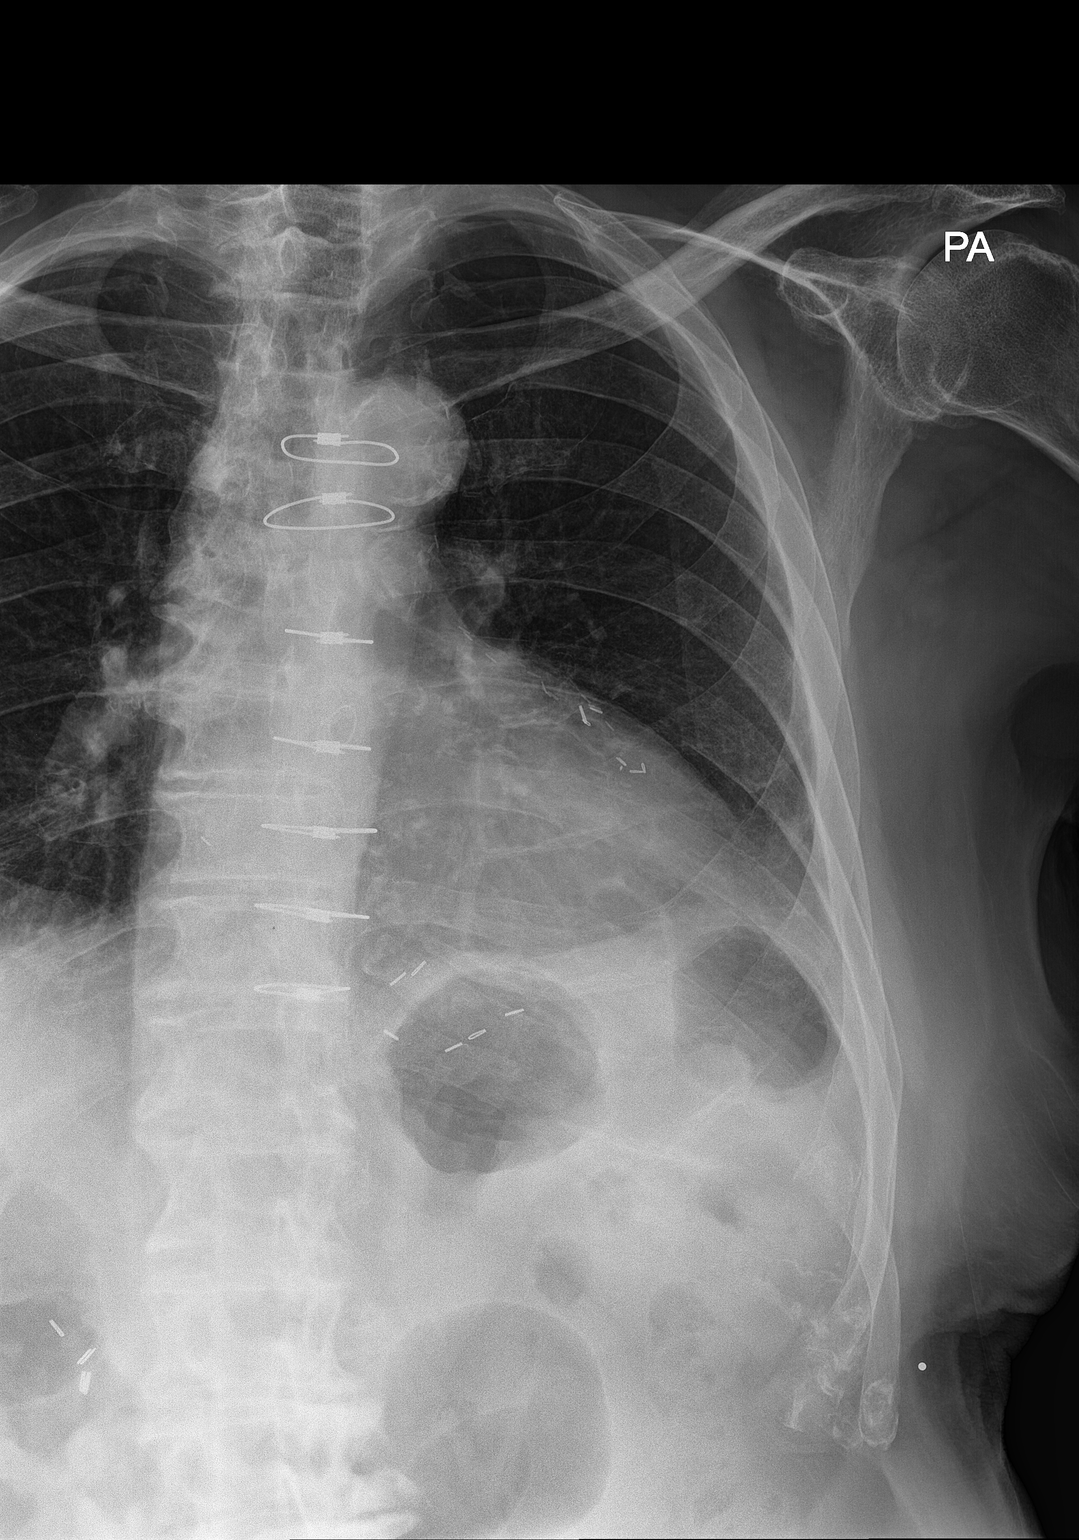

[3 of 3 positions shown; findings below may reference images not displayed]

FINDINGS: Grossly unchanged enlarged cardiac silhouette and mediastinal
contours post median sternotomy. Atherosclerotic plaque within the
thoracic aorta. Grossly unchanged bibasilar heterogeneous opacities,
left greater than right, likely atelectasis or scar. No new focal
airspace opacities. No pleural effusion or pneumothorax. No evidence
of edema.

No definite displaced left-sided rib fractures with special
attention paid to the area demarcated by the radiopaque BB.

Post cholecystectomy.
IMPRESSION: No acute cardiopulmonary disease. Specifically, no definite
displaced left-sided rib fractures.

## 2016-08-06 ENCOUNTER — Ambulatory Visit (INDEPENDENT_AMBULATORY_CARE_PROVIDER_SITE_OTHER): Payer: Medicare Other | Admitting: Family Medicine

## 2016-08-06 ENCOUNTER — Encounter: Payer: Self-pay | Admitting: Family Medicine

## 2016-08-06 VITALS — BP 122/60 | HR 60 | Temp 97.9°F | Ht 68.0 in | Wt 181.8 lb

## 2016-08-06 DIAGNOSIS — I209 Angina pectoris, unspecified: Secondary | ICD-10-CM | POA: Diagnosis not present

## 2016-08-06 DIAGNOSIS — I5032 Chronic diastolic (congestive) heart failure: Secondary | ICD-10-CM

## 2016-08-06 DIAGNOSIS — J441 Chronic obstructive pulmonary disease with (acute) exacerbation: Secondary | ICD-10-CM | POA: Diagnosis not present

## 2016-08-06 MED ORDER — ALBUTEROL SULFATE HFA 108 (90 BASE) MCG/ACT IN AERS: 2.0000 | INHALATION_SPRAY | Freq: Four times a day (QID) | RESPIRATORY_TRACT | 2 refills | Status: DC | PRN

## 2016-08-06 MED ORDER — PREDNISONE 20 MG PO TABS: ORAL_TABLET | ORAL | 0 refills | Status: DC

## 2016-08-06 NOTE — Progress Notes (Signed)
Dr. Frederico Hamman T. Clella Mckeel, MD, [facility] [place], [address] Phone: [phone] Fax: [phone]  08/06/2016  Patient: Andrew [name], MRN: [id number], DOB: 1923/12/11, 83 y.o.  Primary Physician:  [name], MD   Chief Complaint  Patient presents with  . Cough    x 2 weeks   Subjective:   Andrew [name] is a 83 y.o. very pleasant male patient who presents with the following:  Pleasant gentleman who presents primarily with some relatively profound coughing and mild shortness of breath over the last 2 weeks. He has not had any swelling. He also does have a history coronary disease, heart failure, and has some intermittent angina. He was a very heavy smoker for many years.  He did try using some albuterol inhaler, and this did make his symptoms improved considerably.  Past Medical History, Surgical History, Social History, Family History, Problem List, Medications, and Allergies have been reviewed and updated if relevant.  Patient Active Problem List   Diagnosis Date Noted  . Advanced Planning: DNR 04/09/2016    Priority: High  . Right-sided cerebrovascular accident (CVA) [facility])     Priority: High  . Chronic diastolic CHF (congestive heart failure) ([place]) 08/19/2012    Priority: High  . PVD- moderate carotid disease 03/07/2010    Priority: High  . Angina, class II ([place]) 06/18/2008    Priority: High  . Hx of CABG 06/18/2008    Priority: High  . Moderate aortic stenosis 06/18/2008    Priority: High  . Chronic renal insufficiency, stage III (moderate) 06/02/2014    Priority: Medium  . Macular degeneration (senile) of retina 04/29/2014    Priority: Medium  . TIA (transient ischemic attack) 06/03/2013    Priority: Medium  . COPD, minimal-mild 12/22/2010    Priority: Medium  . Spinal stenosis, lumbar region, with neurogenic claudication 07/13/2009    Priority: Medium  . Coronary artery disease of native  artery of native heart with stable angina pectoris ([place]) 06/28/2016  . Bilateral carotid artery stenosis 06/28/2016  . Hyperlipidemia LDL goal <70 06/28/2016  . Essential hypertension   . Alcohol abuse   . Thrombocytopenia ([place])   . Major depressive disorder, single episode 06/11/2013  . Pancreatic cyst 01/29/2013  . OSA (obstructive sleep apnea) 12/26/2012  . S/P excision of acoustic neuroma 06/18/2010  . Pulmonary nodule 06/17/2010  . Vertigo 06/14/2010  . THYROID NODULE 03/13/2010  . ALLERGIC RHINITIS 09/01/2008  . COLONIC POLYPS, HX OF 09/01/2008  . HYPERCHOLESTEROLEMIA 06/18/2008  . GASTROESOPHAGEAL REFLUX DISEASE 06/18/2008    Past Medical History:  Diagnosis Date  . Allergic rhinitis   . AORTIC STENOSIS   . CAD, ARTERY BYPASS GRAFT   . CAROTID ARTERY STENOSIS 03/07/2010   80%  . CHF (congestive heart failure) ([place])   . COPD, mild ([place]) 12/22/2010  . GASTROESOPHAGEAL REFLUX DISEASE   . History of colonic polyps    1999, 2004  . History of prostate cancer 2002   s/p treatment with seeds / radiation  . HYPERCHOLESTEROLEMIA   . Lumbar spinal stenosis 07/13/2009  . Macular degeneration (senile) of retina 04/29/2014  . Pancreatic cyst 01/29/2013   Noted on MRI scan from [facility] on 07/27/2011.  I reviewed report at patient request on 01-29-2013.  "Small cystic focus in the posterior pancreatic head.  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  Follow  up MRI is advised. The main pancreatic duct remains normal in appearance."  Patient do  . S/P excision of acoustic neuroma   . Skin cancer   . Stroke ([place])   . Thyroid nodule 05/2010   Abnormal biopsy, 83 year old patient and his wife have made informed decision to not pursue surgical resection. Potential risk including cancer has been thoroughly discussed with the patient.    Past Surgical History:  Procedure Laterality Date   . CATARACT EXTRACTION    . CATARACT EXTRACTION  2010  . CHOLECYSTECTOMY    . CORONARY ARTERY BYPASS GRAFT    . CRANIECTOMY FOR EXCISION OF ACOUSTIC NEUROMA  1985  . EYE SURGERY    . SHOULDER SURGERY    . TONSILLECTOMY AND ADENOIDECTOMY  1932  . TOTAL KNEE ARTHROPLASTY    . transperineal implatation of palladium      Social History   Social History  . Marital status: Married    Spouse name: N/A  . Number of children: N/A  . Years of education: N/A   Occupational History  . retired  At [employer]    and [place] Topics  . Smoking status: Former Smoker    Packs/day: 2.00    Years: 20.00    Types: Cigarettes    Quit date: 01/22/1961  . Smokeless tobacco: Never Used  . Alcohol use 4.2 oz/week    7 Shots of liquor per week     Comment: 1 drink nightly  . Drug use: No  . Sexual activity: No   Other Topics Concern  . Not on file   Social History Narrative  . No narrative on file    Family History  Problem Relation Age of Onset  . Emphysema Brother   . Lung cancer Brother   . Heart disease Father   . Hypertension Father   . Heart attack Father   . Colon cancer Sister   . Non-Hodgkin's lymphoma Sister   . Heart disease Brother   . Colon cancer Sister   . Skin cancer Sister   . Alcohol abuse Unknown   . Arthritis Unknown   . Cancer Unknown   . Macular degeneration Unknown   . Lung cancer Daughter     No Known Allergies  Medication list reviewed and updated in full in [place].  ROS: GEN: Acute illness details above GI: Tolerating PO intake GU: maintaining adequate hydration and urination Pulm: No SOB Interactive and getting along well at home.  Otherwise, ROS is as per the HPI.  Objective:   BP 122/60   Pulse 60   Temp 97.9 F (36.6 C) (Oral)   Ht 5\' 8"  (1.727 m)   Wt 181 lb 12 oz (82.4 kg)   SpO2 94%   BMI 27.63 kg/m    GEN: A and O x 3. WDWN. NAD.    ENT: Nose clear, ext NML.  No LAD.  No JVD.  TM's  clear. Oropharynx clear.  PULM: Normal WOB, no distress. No crackles, wheezes, rhonchi. CV: RRR, no M/G/R, No rubs, No JVD.   EXT: warm and well-perfused, No c/c/e. PSYCH: Pleasant and conversant.    Laboratory and Imaging Data:  Assessment and Plan:   COPD exacerbation ([place])  Chronic diastolic CHF (congestive heart failure) ([place])  Angina, class II ([place])  He appears euvolemic, and this appears to be primarily pulmonary in character. I'm going to give him some prednisone and refill his albuterol.  Follow-up:  No Follow-up on file.  Future Appointments Date Time Provider [place]  10/01/2016 4:20 PM End, Harrell Gave, MD [facility]  03/11/2017 3:00 PM [facility]  03/11/2017 3:45 PM Nickel, Sharmon Leyden, NP [facility]    Meds ordered this encounter  Medications  . albuterol (PROVENTIL HFA;VENTOLIN HFA) 108 (90 Base) MCG/ACT inhaler    Sig: Inhale 2 puffs into the lungs every 6 (six) hours as needed for wheezing or shortness of breath.    Dispense:  1 Inhaler    Refill:  2  . predniSONE (DELTASONE) 20 MG tablet    Sig: 2 tabs po for 4 days, then 1 tab po for 4 days    Dispense:  12 tablet    Refill:  0   Medications Discontinued During This Encounter  Medication Reason  . albuterol (PROVENTIL HFA;VENTOLIN HFA) 108 (90 BASE) MCG/ACT inhaler    Signed,  Launa Goedken T. Jamori Biggar, MD   Allergies as of 08/06/2016   No Known Allergies     Medication List       Accurate as of 08/06/16  5:13 PM. Always use your most recent med list.          acetaminophen 500 MG tablet Commonly known as:  TYLENOL Take 500 mg by mouth every 6 (six) hours as needed (pain).   albuterol 108 (90 Base) MCG/ACT inhaler Commonly known as:  PROVENTIL HFA;VENTOLIN HFA Inhale 2 puffs into the lungs every 6 (six) hours as needed for wheezing or shortness of breath.   cholecalciferol 1000 units tablet Commonly known as:  VITAMIN D Take 1,000 Units by mouth  daily.   clopidogrel 75 MG tablet Commonly known as:  PLAVIX Take 1 tablet (75 mg total) by mouth daily.   FLUoxetine 10 MG tablet Commonly known as:  PROZAC TAKE 1 TABLET DAILY   furosemide 40 MG tablet Commonly known as:  LASIX Take 1 tablet (40 mg) by mouth in the AM and 1/2 tablet (20 mg) by mouth in the PM   isosorbide mononitrate 120 MG 24 hr tablet Commonly known as:  IMDUR Take 1 tablet (120 mg total) by mouth daily.   metoprolol succinate 25 MG 24 hr tablet Commonly known as:  TOPROL-XL Take 25 mg by mouth daily.   NEPHRO-VITE PO Take 1 tablet by mouth daily.   nitroGLYCERIN 0.4 MG SL tablet Commonly known as:  NITROSTAT Place 0.4 mg under the tongue every 5 (five) minutes as needed for chest pain (Max 3 doses with 15 minutes call 911).   OVER THE COUNTER MEDICATION Place 1 drop into both eyes daily as needed (dry eyes). OTC lubricating eye drop   potassium chloride 10 MEQ tablet Commonly known as:  K-DUR Take 2 tablets (20 mEq total) by mouth daily.   pravastatin 40 MG tablet Commonly known as:  PRAVACHOL TAKE 1 TABLET DAILY  ( CALL TO SCHEDULE AN 6 MONTHS APPOINTMENT FOR FUTURE REFILLS )   predniSONE 20 MG tablet Commonly known as:  DELTASONE 2 tabs po for 4 days, then 1 tab po for 4 days   PRESERVISION/LUTEIN Caps Take 1 capsule by mouth 2 (two) times daily.

## 2016-08-27 ENCOUNTER — Other Ambulatory Visit: Payer: Self-pay | Admitting: Internal Medicine

## 2016-08-27 NOTE — Telephone Encounter (Signed)
Please review for refill. Thanks!  

## 2016-09-30 NOTE — Progress Notes (Signed)
Follow-up Outpatient Visit Date: 10/01/2016  Primary Care Provider: [name], MD [place] [address]  Chief Complaint: Cough and chills  HPI:  Mr. Ingalsbe is a 83 y.o. year-old male with history of CAD status post remote CABG, moderate aortic stenosis, chronic diastolic heart failure, chronic kidney disease, and stroke, who presents for follow-up of coronary artery disease and aortic stenosis. I last saw him on 03/19/16, at which time he was doing relatively well. I last saw Mr. Lynds in May, at which time he was doing well with stable angina. His biggest concern was urinary frequency in the setting of twice daily furosemide, prompting [place] to decrease furosemide to 40 mg in the morning and 20 mg in the evening. His wife reports that the evening dose was continued at 40 mg daily but that the evening dose was moved up earlier in the afternoon. With this, his nocturia has improved.  Mr. Camero's chest pain has been stable to improved. He has not had any significant shortness of breath but has noted a cough for the last several weeks. He was prescribed an albuterol inhaler and prednisone by Dr. [name] with some improvement. However, he continues to have a nonproductive cough and has also had intermittent chills over the last 2-3 days. His wife reports that Mr. Riviera was not febrile. He has not had any edema or palpitations. Lightheadedness has been rare. He has stable two-pillow orthopnea.  --------------------------------------------------------------------------------------------------  Cardiovascular History & Procedures: Cardiovascular Problems:  Coronary artery disease status post CABG in 1991  Moderate aortic stenosis  Ischemic cardiomyopathy  Risk Factors:  Known coronary artery disease, stroke, hyperlipidemia, male gender, and age greater than 25  Cath/PCI:  LHC (2004): One limb of sequential SVG to OM1, OM2, and OM3 occluded.  CV Surgery:  CABG  (1991): LIMA to LAD, SVG to PDA, sequential SVG to OM1, OM2, and OM3.  EP Procedures and Devices:  None  Non-Invasive Evaluation(s):  TTE (06/21/16): Mildly dilated left ventricle with mild LVH. LVEF 55-60%. Thickened aortic valve with moderately restricted motion. Mean gradient 22 mmHg. Mild aortic regurgitation and mitral regurgitation. Mild left atrial enlargement. Moderate pulmonic regurgitation. Mild pulmonary hypertension. No significant change from prior echo in 2017.  Carotid artery Doppler (05/28/16): 60-79% left and less than 40% right internal carotid artery stenoses.  Recent CV Pertinent Labs: Lab Results  Component Value Date   CHOL 105 03/29/2016   HDL 40.20 03/29/2016   LDLCALC 46 03/29/2016   LDLDIRECT 67.0 03/13/2010   TRIG 97.0 03/29/2016   CHOLHDL 3 03/29/2016   INR 1.11 06/20/2015   K 4.4 03/29/2016   BUN 24 (H) 03/29/2016   CREATININE 1.42 03/29/2016    Past medical and surgical history were reviewed and updated in EPIC.  Outpatient Encounter Prescriptions as of 10/01/2016  Medication Sig  . acetaminophen (TYLENOL) 500 MG tablet Take 500 mg by mouth every 6 (six) hours as needed (pain).  Marland Kitchen albuterol (PROVENTIL HFA;VENTOLIN HFA) 108 (90 Base) MCG/ACT inhaler Inhale 2 puffs into the lungs every 6 (six) hours as needed for wheezing or shortness of breath.  . B Complex-C-Folic Acid (NEPHRO-VITE PO) Take 1 tablet by mouth daily.    . cholecalciferol (VITAMIN D) 1000 units tablet Take 1,000 Units by mouth daily.  . clopidogrel (PLAVIX) 75 MG tablet Take 1 tablet (75 mg total) by mouth daily.  [name] 10 MG tablet TAKE 1 TABLET DAILY  . isosorbide mononitrate (IMDUR) 120 MG 24 hr tablet  Take 1 tablet (120 mg total) by mouth daily.  . metoprolol succinate (TOPROL-XL) 25 MG 24 hr tablet Take 25 mg by mouth daily.  . Multiple Vitamins-Minerals (PRESERVISION/LUTEIN) CAPS Take 1 capsule by mouth 2 (two) times daily.  . nitroGLYCERIN (NITROSTAT) 0.4 MG SL  tablet Place 0.4 mg under the tongue every 5 (five) minutes as needed for chest pain (Max 3 doses with 15 minutes call 911).  Marland Kitchen OVER THE COUNTER MEDICATION Place 1 drop into both eyes daily as needed (dry eyes). OTC lubricating eye drop  . potassium chloride (K-DUR,KLOR-CON) 10 MEQ tablet TAKE TWO TABLETS BY MOUTH DAILY   . pravastatin (PRAVACHOL) 40 MG tablet TAKE 1 TABLET DAILY  ( CALL TO SCHEDULE AN 6 MONTHS APPOINTMENT FOR FUTURE REFILLS )  . predniSONE (DELTASONE) 20 MG tablet 2 tabs po for 4 days, then 1 tab po for 4 days  . [DISCONTINUED] furosemide (LASIX) 40 MG tablet Take 1 tablet (40 mg) by mouth in the AM and 1/2 tablet (20 mg) by mouth in the PM (Patient taking differently: Take 40 mg by mouth 2 (two) times daily. Take 1 tablet (40 mg) by mouth in the AM and 1/2 tablet (20 mg) by mouth in the PM)  . [DISCONTINUED] potassium chloride (K-DUR) 10 MEQ tablet Take 2 tablets (20 mEq total) by mouth daily.   No facility-administered encounter medications on file as of 10/01/2016.     Allergies: Patient has no known allergies.  Social History   Social History  . Marital status: Married    Spouse name: N/A  . Number of children: N/A  . Years of education: N/A   Occupational History  . retired  At [employer]    and [place] Topics  . Smoking status: Former Smoker    Packs/day: 2.00    Years: 20.00    Types: Cigarettes    Quit date: 01/22/1961  . Smokeless tobacco: Never Used  . Alcohol use 4.2 oz/week    7 Shots of liquor per week     Comment: 1 drink nightly  . Drug use: No  . Sexual activity: No   Other Topics Concern  . Not on file   Social History Narrative  . No narrative on file    Family History  Problem Relation Age of Onset  . Emphysema Brother   . Lung cancer Brother   . Heart disease Father   . Hypertension Father   . Heart attack Father   . Colon cancer Sister   . Non-Hodgkin's lymphoma Sister   . Heart disease Brother   .  Colon cancer Sister   . Skin cancer Sister   . Alcohol abuse Unknown   . Arthritis Unknown   . Cancer Unknown   . Macular degeneration Unknown   . Lung cancer Daughter     Review of Systems: A 12-system review of systems was performed and was negative except as noted in the HPI.  --------------------------------------------------------------------------------------------------  Physical Exam: BP (!) 122/50   Pulse (!) 58   Ht 5\' 8"  (1.727 m)   Wt 181 lb (82.1 kg)   SpO2 96%   BMI 27.52 kg/m   General:  Elderly man, seated comfortably in the exam room. He is accompanied by his wife. HEENT: No conjunctival pallor or scleral icterus.  Moist mucous membranes.  OP clear. Neck: Supple without lymphadenopathy, thyromegaly, JVD, or HJR.  Lungs: Normal work of breathing. Right basilar crackles. No wheezes. Heart: Regular  rate and rhythm with 2/6 systolic crescendo-decrescendo murmur loudest at the right upper sternal border. No rubs or gallops. Nondisplaced PMI. Abd: Bowel sounds present.  Soft, NT/ND without hepatosplenomegaly Ext: No lower extremity edema.  2+ radial and 1+ pedal pulses bilaterally. Skin: Warm and dry without rash.  Lab Results  Component Value Date   WBC 5.1 03/29/2016   HGB 14.7 03/29/2016   HCT 44.2 03/29/2016   MCV 105.9 (H) 03/29/2016   PLT 101.0 (L) 03/29/2016    Lab Results  Component Value Date   NA 141 03/29/2016   K 4.4 03/29/2016   CL 103 03/29/2016   CO2 34 (H) 03/29/2016   BUN 24 (H) 03/29/2016   CREATININE 1.42 03/29/2016   GLUCOSE 92 03/29/2016   ALT 43 03/29/2016    Lab Results  Component Value Date   CHOL 105 03/29/2016   HDL 40.20 03/29/2016   LDLCALC 46 03/29/2016   LDLDIRECT 67.0 03/13/2010   TRIG 97.0 03/29/2016   CHOLHDL 3 03/29/2016   --------------------------------------------------------------------------------------------------  ASSESSMENT AND PLAN: Cough and chills Cough improved somewhat with empiric treatment  for COPD exacerbation prescribed by Dr. [name] in mid July. Mr. Corradi has continued to have a cough and also reports chills the last few nights. Exam today is notable for right basilar crackles. My concern would be for potential pneumonia. We will start empiric treatment for community-acquired pneumonia with azithromycin 500 mg by mouth 1 followed by 250 mg by mouth daily 4 days. We will refer him for a PA and lateral chest radiograph as well. I have advised Mr. Santaella to follow-up with Dr. [name] later this week. I advised him to go to the ER if he has worsening shortness of breath, fevers, lightheadedness, or chest pain.  Coronary artery disease with stable angina Chest pain is stable to improved. We will continue his current antianginal therapy, including metoprolol and isosorbide mononitrate.  Aortic stenosis No symptoms to suggest worsening aortic stenosis. Most recent echo in May showed persistent moderate AS. We will continue clinical follow-up and plan to repeat echo next year.  Chronic diastolic heart failure Mr. Faulstich appears euvolemic and well compensated. Nocturia has improved with shifting furosemide earlier in the afternoon. He should continue on furosemide 40 mg twice a day.  Carotid artery disease status post stroke No new neurologic symptoms. We will continue with clopidogrel and pravastatin for secondary prevention.  Hyperlipidemia  LDL in March was well below goal. Continue current dose of pravastatin.  Follow-up: Return to clinic in 6 months.  Nelva Bush, MD 10/01/2016 5:10 PM

## 2016-10-01 ENCOUNTER — Ambulatory Visit (INDEPENDENT_AMBULATORY_CARE_PROVIDER_SITE_OTHER): Payer: Medicare Other | Admitting: Internal Medicine

## 2016-10-01 ENCOUNTER — Encounter: Payer: Self-pay | Admitting: Internal Medicine

## 2016-10-01 VITALS — BP 122/50 | HR 58 | Ht 68.0 in | Wt 181.0 lb

## 2016-10-01 DIAGNOSIS — R05 Cough: Secondary | ICD-10-CM

## 2016-10-01 DIAGNOSIS — I25118 Atherosclerotic heart disease of native coronary artery with other forms of angina pectoris: Secondary | ICD-10-CM

## 2016-10-01 DIAGNOSIS — I739 Peripheral vascular disease, unspecified: Secondary | ICD-10-CM

## 2016-10-01 DIAGNOSIS — I779 Disorder of arteries and arterioles, unspecified: Secondary | ICD-10-CM

## 2016-10-01 DIAGNOSIS — E785 Hyperlipidemia, unspecified: Secondary | ICD-10-CM | POA: Diagnosis not present

## 2016-10-01 DIAGNOSIS — R6883 Chills (without fever): Secondary | ICD-10-CM | POA: Diagnosis not present

## 2016-10-01 DIAGNOSIS — I350 Nonrheumatic aortic (valve) stenosis: Secondary | ICD-10-CM | POA: Diagnosis not present

## 2016-10-01 DIAGNOSIS — Z8673 Personal history of transient ischemic attack (TIA), and cerebral infarction without residual deficits: Secondary | ICD-10-CM | POA: Diagnosis not present

## 2016-10-01 DIAGNOSIS — I5032 Chronic diastolic (congestive) heart failure: Secondary | ICD-10-CM | POA: Diagnosis not present

## 2016-10-01 DIAGNOSIS — R059 Cough, unspecified: Secondary | ICD-10-CM

## 2016-10-01 MED ORDER — AZITHROMYCIN 250 MG PO TABS: ORAL_TABLET | ORAL | 0 refills | Status: DC

## 2016-10-01 NOTE — Patient Instructions (Signed)
Medication Instructions:  Start ZPak tonight--2 tablets tonight then 1 tablet daily.  Labwork: None  Testing/Procedures: A chest x-ray takes a picture of the organs and structures inside the chest, including the heart, lungs, and blood vessels. This test can show several things, including, whether the heart is enlarges; whether fluid is building up in the lungs; and whether pacemaker / defibrillator leads are still in place.  Go to [place] floor 8:30AM-5PM [phone]  Follow-Up: Your physician wants you to follow-up in: 6 months with Dr End. (MArch 2019).You will receive a reminder letter in the mail two months in advance. If you don't receive a letter, please call our office to schedule the follow-up appointment.   Any Other Special Instructions Will Be Listed Below (If Applicable).  Call Dr Arlyn Dunning  office to schedule an appointment later this week.   If you need a refill on your cardiac medications before your next appointment, please call your pharmacy.

## 2016-10-02 ENCOUNTER — Ambulatory Visit
Admission: RE | Admit: 2016-10-02 | Discharge: 2016-10-02 | Disposition: A | Discharge status: Home / Self Care | Payer: Medicare Other | Source: Ambulatory Visit | Attending: Internal Medicine | Admitting: Internal Medicine

## 2016-10-02 DIAGNOSIS — R6883 Chills (without fever): Secondary | ICD-10-CM

## 2016-10-02 DIAGNOSIS — R05 Cough: Secondary | ICD-10-CM

## 2016-10-02 DIAGNOSIS — R059 Cough, unspecified: Secondary | ICD-10-CM

## 2016-10-02 DIAGNOSIS — I5032 Chronic diastolic (congestive) heart failure: Secondary | ICD-10-CM

## 2016-10-02 DIAGNOSIS — I350 Nonrheumatic aortic (valve) stenosis: Secondary | ICD-10-CM

## 2016-10-08 ENCOUNTER — Ambulatory Visit (INDEPENDENT_AMBULATORY_CARE_PROVIDER_SITE_OTHER): Payer: Medicare Other | Admitting: Family Medicine

## 2016-10-08 ENCOUNTER — Encounter: Payer: Self-pay | Admitting: Family Medicine

## 2016-10-08 VITALS — BP 110/60 | HR 57 | Temp 98.3°F | Ht 68.0 in | Wt 182.8 lb

## 2016-10-08 DIAGNOSIS — J189 Pneumonia, unspecified organism: Secondary | ICD-10-CM

## 2016-10-08 MED ORDER — FLUOXETINE HCL 10 MG PO TABS: 10.0000 mg | ORAL_TABLET | Freq: Every day | ORAL | 1 refills | Status: DC

## 2016-10-08 NOTE — Progress Notes (Signed)
Dr. Frederico Hamman T. Genifer Lazenby, MD, [facility] [place], [address] Phone: [phone] Fax: [phone]  10/08/2016  Patient: Andrew [name], MRN: [id number], DOB: 02/20/1923, 83 y.o.  Primary Physician:  [name], MD   Chief Complaint  Patient presents with  . Follow-up    PNA per cardilogist   Subjective:   Andrew [name] is a 83 y.o. very pleasant male patient who presents with the following:  PNA: seen by Dr. Saunders Revel 10/01/2016 and dx with CAP. Placed on Zpak.   He also had a chest x-ray which showed a infiltrate.  He is here with his wife today also to. He has had some increased forgetfulness since their transition to the friend's home. Overall, he feels much better compared to recently.  Past Medical History, Surgical History, Social History, Family History, Problem List, Medications, and Allergies have been reviewed and updated if relevant.  Patient Active Problem List   Diagnosis Date Noted  . Advanced Planning: DNR 04/09/2016    Priority: High  . Right-sided cerebrovascular accident (CVA) [facility])     Priority: High  . Chronic diastolic CHF (congestive heart failure) ([place]) 08/19/2012    Priority: High  . PVD- moderate carotid disease 03/07/2010    Priority: High  . Angina, class II ([place]) 06/18/2008    Priority: High  . Hx of CABG 06/18/2008    Priority: High  . Moderate aortic stenosis 06/18/2008    Priority: High  . Chronic renal insufficiency, stage III (moderate) 06/02/2014    Priority: Medium  . Macular degeneration (senile) of retina 04/29/2014    Priority: Medium  . TIA (transient ischemic attack) 06/03/2013    Priority: Medium  . COPD, minimal-mild 12/22/2010    Priority: Medium  . Spinal stenosis, lumbar region, with neurogenic claudication 07/13/2009    Priority: Medium  . Cough 10/01/2016  . Chills 10/01/2016  . History of stroke 10/01/2016  . Coronary artery disease of native artery of  native heart with stable angina pectoris ([place]) 06/28/2016  . Bilateral carotid artery stenosis 06/28/2016  . Hyperlipidemia LDL goal <70 06/28/2016  . Essential hypertension   . Alcohol abuse   . Thrombocytopenia ([place])   . Major depressive disorder, single episode 06/11/2013  . Pancreatic cyst 01/29/2013  . OSA (obstructive sleep apnea) 12/26/2012  . S/P excision of acoustic neuroma 06/18/2010  . Pulmonary nodule 06/17/2010  . Vertigo 06/14/2010  . THYROID NODULE 03/13/2010  . ALLERGIC RHINITIS 09/01/2008  . COLONIC POLYPS, HX OF 09/01/2008  . HYPERCHOLESTEROLEMIA 06/18/2008  . GASTROESOPHAGEAL REFLUX DISEASE 06/18/2008    Past Medical History:  Diagnosis Date  . Allergic rhinitis   . AORTIC STENOSIS   . CAD, ARTERY BYPASS GRAFT   . CAROTID ARTERY STENOSIS 03/07/2010   80%  . CHF (congestive heart failure) ([place])   . COPD, mild ([place]) 12/22/2010  . GASTROESOPHAGEAL REFLUX DISEASE   . History of colonic polyps    1999, 2004  . History of prostate cancer 2002   s/p treatment with seeds / radiation  . HYPERCHOLESTEROLEMIA   . Lumbar spinal stenosis 07/13/2009  . Macular degeneration (senile) of retina 04/29/2014  . Pancreatic cyst 01/29/2013   Noted on MRI scan from [facility] on 07/27/2011.  I reviewed report at patient request on 01-29-2013.  "Small cystic focus in the posterior pancreatic head.  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  Follow up MRI is advised. The main pancreatic duct remains normal in appearance."  Patient do  . S/P excision of acoustic neuroma   . Skin cancer   . Stroke ([place])   . Thyroid nodule 05/2010   Abnormal biopsy, 83 year old patient and his wife have made informed decision to not pursue surgical resection. Potential risk including cancer has been thoroughly discussed with the patient.    Past Surgical History:  Procedure Laterality Date  .  CATARACT EXTRACTION    . CATARACT EXTRACTION  2010  . CHOLECYSTECTOMY    . CORONARY ARTERY BYPASS GRAFT    . CRANIECTOMY FOR EXCISION OF ACOUSTIC NEUROMA  1985  . EYE SURGERY    . SHOULDER SURGERY    . TONSILLECTOMY AND ADENOIDECTOMY  1932  . TOTAL KNEE ARTHROPLASTY    . transperineal implatation of palladium      Social History   Social History  . Marital status: Married    Spouse name: N/A  . Number of children: N/A  . Years of education: N/A   Occupational History  . retired  At [employer]    and [place] Topics  . Smoking status: Former Smoker    Packs/day: 2.00    Years: 20.00    Types: Cigarettes    Quit date: 01/22/1961  . Smokeless tobacco: Never Used  . Alcohol use 4.2 oz/week    7 Shots of liquor per week     Comment: 1 drink nightly  . Drug use: No  . Sexual activity: No   Other Topics Concern  . Not on file   Social History Narrative  . No narrative on file    Family History  Problem Relation Age of Onset  . Emphysema Brother   . Lung cancer Brother   . Heart disease Father   . Hypertension Father   . Heart attack Father   . Colon cancer Sister   . Non-Hodgkin's lymphoma Sister   . Heart disease Brother   . Colon cancer Sister   . Skin cancer Sister   . Alcohol abuse Unknown   . Arthritis Unknown   . Cancer Unknown   . Macular degeneration Unknown   . Lung cancer Daughter     No Known Allergies  Medication list reviewed and updated in full in [place].  ROS: GEN: Acute illness details above GI: Tolerating PO intake GU: maintaining adequate hydration and urination Pulm: No SOB Interactive and getting along well at home.  Otherwise, ROS is as per the HPI.  Objective:   BP 110/60   Pulse (!) 57   Temp 98.3 F (36.8 C) (Oral)   Ht 5\' 8"  (1.727 m)   Wt 182 lb 12 oz (82.9 kg)   SpO2 95%   BMI 27.79 kg/m    GEN: WDWN, NAD, Non-toxic, Alert & Oriented x 3 HEENT: Atraumatic, Normocephalic.    Ears and Nose: No external deformity. EXTR: No clubbing/cyanosis/edema NEURO: Normal gait.  PSYCH: Normally interactive. Conversant. Not depressed or anxious appearing.  Calm demeanor.     Laboratory and Imaging Data: Dg Chest 2 View  Result Date: 10/02/2016 CLINICAL DATA:  Dry cough with DOE x 6 months, chest pain, CHF, COPD, CAD, hx CABG EXAM: CHEST  2 VIEW COMPARISON:  06/21/2015 FINDINGS: Status post median sternotomy. Heart is mildly enlarged. There is perihilar peribronchial thickening. More focal opacity is identified at the right lung base and left mid  lung zone and right lateral mid lung zone, compatible with infectious infiltrates. No pleural effusions are present. IMPRESSION: 1. Suspect multifocal infiltrates superimposed on bronchitic changes. Followup PA and lateral chest X-ray is recommended in 3-4 weeks following trial of antibiotic therapy to ensure resolution and exclude underlying malignancy. 2. Stable cardiomegaly. Electronically Signed   By: Nolon Nations M.D.   On: 10/02/2016 13:54     Assessment and Plan:   Community acquired pneumonia, unspecified laterality - Plan: DG Chest 2 View  On exam, this appears to be better.  Repeat chest x-ray in 3 weeks.  Follow-up: No Follow-up on file.  Future Appointments Date Time Provider [place]  03/11/2017 3:00 PM [facility] 4 [facility]  03/11/2017 3:45 PM Nickel, Sharmon Leyden, NP [facility]    Meds ordered this encounter  Medications  . FLUoxetine (PROZAC) 10 MG tablet    Sig: Take 1 tablet (10 mg total) by mouth daily.    Dispense:  90 tablet    Refill:  1   Medications Discontinued During This Encounter  Medication Reason  . azithromycin (ZITHROMAX) 250 MG tablet Completed Course  . FLUoxetine (PROZAC) 10 MG tablet Reorder   Orders Placed This Encounter  Procedures  . DG Chest 2 View    Signed,  Frederico Hamman T. Cera Rorke, MD   Patient's Medications  New Prescriptions   No medications on file   Previous Medications   ACETAMINOPHEN (TYLENOL) 500 MG TABLET    Take 500 mg by mouth every 6 (six) hours as needed (pain).   ALBUTEROL (PROVENTIL HFA;VENTOLIN HFA) 108 (90 BASE) MCG/ACT INHALER    Inhale 2 puffs into the lungs every 6 (six) hours as needed for wheezing or shortness of breath.   B COMPLEX-C-FOLIC ACID (NEPHRO-VITE PO)    Take 1 tablet by mouth daily.     CHOLECALCIFEROL (VITAMIN D) 1000 UNITS TABLET    Take 1,000 Units by mouth daily.   CLOPIDOGREL (PLAVIX) 75 MG TABLET    Take 1 tablet (75 mg total) by mouth daily.   FUROSEMIDE (LASIX) 40 MG TABLET    Take 1 tablet (40 mg total) by mouth 2 (two) times daily.   ISOSORBIDE MONONITRATE (IMDUR) 120 MG 24 HR TABLET    Take 1 tablet (120 mg total) by mouth daily.   METOPROLOL SUCCINATE (TOPROL-XL) 25 MG 24 HR TABLET    Take 25 mg by mouth daily.   MULTIPLE VITAMINS-MINERALS (PRESERVISION/LUTEIN) CAPS    Take 1 capsule by mouth 2 (two) times daily.   NITROGLYCERIN (NITROSTAT) 0.4 MG SL TABLET    Place 0.4 mg under the tongue every 5 (five) minutes as needed for chest pain (Max 3 doses with 15 minutes call 911).   OVER THE COUNTER MEDICATION    Place 1 drop into both eyes daily as needed (dry eyes). OTC lubricating eye drop   POTASSIUM CHLORIDE (K-DUR,KLOR-CON) 10 MEQ TABLET    TAKE TWO TABLETS BY MOUTH DAILY    PRAVASTATIN (PRAVACHOL) 40 MG TABLET    TAKE 1 TABLET DAILY  ( CALL TO SCHEDULE AN 6 MONTHS APPOINTMENT FOR FUTURE REFILLS )   PREDNISONE (DELTASONE) 20 MG TABLET    2 tabs po for 4 days, then 1 tab po for 4 days  Modified Medications   Modified Medication Previous Medication   FLUOXETINE (PROZAC) 10 MG TABLET FLUoxetine (PROZAC) 10 MG tablet      Take 1 tablet (10 mg total) by mouth daily.    TAKE 1 TABLET  DAILY  Discontinued Medications   AZITHROMYCIN (ZITHROMAX) 250 MG TABLET    Take 2 tablets ( 500mg  ) by mouth the first day, then take 1 tablet by mouth daily

## 2016-10-09 ENCOUNTER — Encounter (HOSPITAL_COMMUNITY): Payer: Self-pay

## 2016-10-09 ENCOUNTER — Emergency Department (HOSPITAL_COMMUNITY): Payer: Medicare Other

## 2016-10-09 ENCOUNTER — Inpatient Hospital Stay (HOSPITAL_COMMUNITY)
Admission: EM | Admit: 2016-10-09 | Discharge: 2016-10-14 | DRG: 280 | Disposition: A | Discharge status: Home Health | Payer: Medicare Other | Attending: Internal Medicine | Admitting: Internal Medicine

## 2016-10-09 DIAGNOSIS — J189 Pneumonia, unspecified organism: Secondary | ICD-10-CM | POA: Diagnosis present

## 2016-10-09 DIAGNOSIS — I5042 Chronic combined systolic (congestive) and diastolic (congestive) heart failure: Secondary | ICD-10-CM

## 2016-10-09 DIAGNOSIS — F319 Bipolar disorder, unspecified: Secondary | ICD-10-CM | POA: Diagnosis present

## 2016-10-09 DIAGNOSIS — I272 Pulmonary hypertension, unspecified: Secondary | ICD-10-CM | POA: Diagnosis present

## 2016-10-09 DIAGNOSIS — Z8249 Family history of ischemic heart disease and other diseases of the circulatory system: Secondary | ICD-10-CM

## 2016-10-09 DIAGNOSIS — Z951 Presence of aortocoronary bypass graft: Secondary | ICD-10-CM

## 2016-10-09 DIAGNOSIS — I471 Supraventricular tachycardia: Secondary | ICD-10-CM | POA: Diagnosis present

## 2016-10-09 DIAGNOSIS — I25118 Atherosclerotic heart disease of native coronary artery with other forms of angina pectoris: Secondary | ICD-10-CM | POA: Diagnosis not present

## 2016-10-09 DIAGNOSIS — Z66 Do not resuscitate: Secondary | ICD-10-CM | POA: Diagnosis present

## 2016-10-09 DIAGNOSIS — R079 Chest pain, unspecified: Secondary | ICD-10-CM | POA: Insufficient documentation

## 2016-10-09 DIAGNOSIS — Z87891 Personal history of nicotine dependence: Secondary | ICD-10-CM | POA: Diagnosis not present

## 2016-10-09 DIAGNOSIS — I080 Rheumatic disorders of both mitral and aortic valves: Secondary | ICD-10-CM | POA: Diagnosis present

## 2016-10-09 DIAGNOSIS — I255 Ischemic cardiomyopathy: Secondary | ICD-10-CM | POA: Diagnosis present

## 2016-10-09 DIAGNOSIS — I10 Essential (primary) hypertension: Secondary | ICD-10-CM | POA: Diagnosis present

## 2016-10-09 DIAGNOSIS — F0390 Unspecified dementia without behavioral disturbance: Secondary | ICD-10-CM | POA: Diagnosis present

## 2016-10-09 DIAGNOSIS — I2510 Atherosclerotic heart disease of native coronary artery without angina pectoris: Secondary | ICD-10-CM | POA: Diagnosis present

## 2016-10-09 DIAGNOSIS — I214 Non-ST elevation (NSTEMI) myocardial infarction: Secondary | ICD-10-CM | POA: Diagnosis not present

## 2016-10-09 DIAGNOSIS — E785 Hyperlipidemia, unspecified: Secondary | ICD-10-CM | POA: Diagnosis present

## 2016-10-09 DIAGNOSIS — Z8546 Personal history of malignant neoplasm of prostate: Secondary | ICD-10-CM

## 2016-10-09 DIAGNOSIS — I5043 Acute on chronic combined systolic (congestive) and diastolic (congestive) heart failure: Secondary | ICD-10-CM | POA: Diagnosis present

## 2016-10-09 DIAGNOSIS — Z85828 Personal history of other malignant neoplasm of skin: Secondary | ICD-10-CM

## 2016-10-09 DIAGNOSIS — N183 Chronic kidney disease, stage 3 (moderate): Secondary | ICD-10-CM | POA: Diagnosis present

## 2016-10-09 DIAGNOSIS — K219 Gastro-esophageal reflux disease without esophagitis: Secondary | ICD-10-CM | POA: Diagnosis present

## 2016-10-09 DIAGNOSIS — I739 Peripheral vascular disease, unspecified: Secondary | ICD-10-CM | POA: Diagnosis present

## 2016-10-09 DIAGNOSIS — Z7902 Long term (current) use of antithrombotics/antiplatelets: Secondary | ICD-10-CM | POA: Diagnosis not present

## 2016-10-09 DIAGNOSIS — Z8673 Personal history of transient ischemic attack (TIA), and cerebral infarction without residual deficits: Secondary | ICD-10-CM | POA: Diagnosis not present

## 2016-10-09 DIAGNOSIS — I130 Hypertensive heart and chronic kidney disease with heart failure and stage 1 through stage 4 chronic kidney disease, or unspecified chronic kidney disease: Secondary | ICD-10-CM | POA: Diagnosis present

## 2016-10-09 DIAGNOSIS — H3530 Unspecified macular degeneration: Secondary | ICD-10-CM | POA: Diagnosis present

## 2016-10-09 DIAGNOSIS — J9600 Acute respiratory failure, unspecified whether with hypoxia or hypercapnia: Secondary | ICD-10-CM | POA: Diagnosis present

## 2016-10-09 DIAGNOSIS — Z79899 Other long term (current) drug therapy: Secondary | ICD-10-CM

## 2016-10-09 DIAGNOSIS — I5032 Chronic diastolic (congestive) heart failure: Secondary | ICD-10-CM | POA: Diagnosis not present

## 2016-10-09 DIAGNOSIS — Z96659 Presence of unspecified artificial knee joint: Secondary | ICD-10-CM | POA: Diagnosis present

## 2016-10-09 DIAGNOSIS — J440 Chronic obstructive pulmonary disease with acute lower respiratory infection: Secondary | ICD-10-CM | POA: Diagnosis present

## 2016-10-09 DIAGNOSIS — I352 Nonrheumatic aortic (valve) stenosis with insufficiency: Secondary | ICD-10-CM | POA: Diagnosis not present

## 2016-10-09 DIAGNOSIS — I495 Sick sinus syndrome: Secondary | ICD-10-CM | POA: Diagnosis present

## 2016-10-09 LAB — CBC WITH DIFFERENTIAL/PLATELET
BASOS PCT: 0 %
Basophils Absolute: 0 10*3/uL (ref 0.0–0.1)
Eosinophils Absolute: 0.2 10*3/uL (ref 0.0–0.7)
Eosinophils Relative: 2 %
HEMATOCRIT: 45.1 % (ref 39.0–52.0)
Hemoglobin: 15 g/dL (ref 13.0–17.0)
Lymphocytes Relative: 16 %
Lymphs Abs: 1.5 10*3/uL (ref 0.7–4.0)
MCH: 33.3 pg (ref 26.0–34.0)
MCHC: 33.3 g/dL (ref 30.0–36.0)
MCV: 100 fL (ref 78.0–100.0)
MONO ABS: 1 10*3/uL (ref 0.1–1.0)
MONOS PCT: 10 %
NEUTROS ABS: 6.7 10*3/uL (ref 1.7–7.7)
Neutrophils Relative %: 72 %
Platelets: 147 10*3/uL — ABNORMAL LOW (ref 150–400)
RBC: 4.51 MIL/uL (ref 4.22–5.81)
RDW: 14.8 % (ref 11.5–15.5)
WBC: 9.3 10*3/uL (ref 4.0–10.5)

## 2016-10-09 LAB — COMPREHENSIVE METABOLIC PANEL
ALBUMIN: 3.2 g/dL — AB (ref 3.5–5.0)
ALK PHOS: 89 U/L (ref 38–126)
ALT: 25 U/L (ref 17–63)
AST: 46 U/L — ABNORMAL HIGH (ref 15–41)
Anion gap: 10 (ref 5–15)
BILIRUBIN TOTAL: 1.2 mg/dL (ref 0.3–1.2)
BUN: 28 mg/dL — AB (ref 6–20)
CALCIUM: 8.6 mg/dL — AB (ref 8.9–10.3)
CO2: 21 mmol/L — ABNORMAL LOW (ref 22–32)
CREATININE: 1.38 mg/dL — AB (ref 0.61–1.24)
Chloride: 107 mmol/L (ref 101–111)
GFR calc Af Amer: 50 mL/min — ABNORMAL LOW (ref >60)
GFR calc non Af Amer: 43 mL/min — ABNORMAL LOW (ref >60)
GLUCOSE: 121 mg/dL — AB (ref 65–99)
Potassium: 4.2 mmol/L (ref 3.5–5.1)
Sodium: 138 mmol/L (ref 135–145)
TOTAL PROTEIN: 6.3 g/dL — AB (ref 6.5–8.1)

## 2016-10-09 LAB — TROPONIN I
TROPONIN I: 3.63 ng/mL — AB (ref <0.03)
TROPONIN I: 16.2 ng/mL — AB (ref <0.03)
TROPONIN I: 34.18 ng/mL — AB (ref <0.03)
Troponin I: 0.33 ng/mL (ref <0.03)

## 2016-10-09 LAB — CBC
HCT: 46.7 % (ref 39.0–52.0)
HEMOGLOBIN: 15 g/dL (ref 13.0–17.0)
MCH: 32.4 pg (ref 26.0–34.0)
MCHC: 32.1 g/dL (ref 30.0–36.0)
MCV: 100.9 fL — AB (ref 78.0–100.0)
PLATELETS: 131 10*3/uL — AB (ref 150–400)
RBC: 4.63 MIL/uL (ref 4.22–5.81)
RDW: 15.2 % (ref 11.5–15.5)
WBC: 8.9 10*3/uL (ref 4.0–10.5)

## 2016-10-09 LAB — HEPARIN LEVEL (UNFRACTIONATED)
Heparin Unfractionated: 0.32 IU/mL (ref 0.30–0.70)
Heparin Unfractionated: 0.38 IU/mL (ref 0.30–0.70)

## 2016-10-09 LAB — I-STAT TROPONIN, ED: TROPONIN I, POC: 0.53 ng/mL — AB (ref 0.00–0.08)

## 2016-10-09 LAB — I-STAT CG4 LACTIC ACID, ED: Lactic Acid, Venous: 1.36 mmol/L (ref 0.5–1.9)

## 2016-10-09 LAB — BRAIN NATRIURETIC PEPTIDE: B NATRIURETIC PEPTIDE 5: 478.7 pg/mL — AB (ref 0.0–100.0)

## 2016-10-09 LAB — STREP PNEUMONIAE URINARY ANTIGEN: Strep Pneumo Urinary Antigen: NEGATIVE

## 2016-10-09 LAB — D-DIMER, QUANTITATIVE: D-Dimer, Quant: 1.72 ug/mL-FEU — ABNORMAL HIGH (ref 0.00–0.50)

## 2016-10-09 LAB — HIV ANTIBODY (ROUTINE TESTING W REFLEX): HIV Screen 4th Generation wRfx: NONREACTIVE

## 2016-10-09 LAB — LIPASE, BLOOD: Lipase: 31 U/L (ref 11–51)

## 2016-10-09 MED ORDER — FUROSEMIDE 40 MG PO TABS
40.0000 mg | ORAL_TABLET | Freq: Two times a day (BID) | ORAL | Status: DC
  Administered 2016-10-09 – 2016-10-10 (×3): 40 mg via ORAL
  Filled 2016-10-09: qty 2
  Filled 2016-10-09 (×2): qty 1

## 2016-10-09 MED ORDER — DEXTROSE 5 % IV SOLN
1.0000 g | Freq: Every day | INTRAVENOUS | Status: DC
  Administered 2016-10-10: 1 g via INTRAVENOUS
  Filled 2016-10-09 (×3): qty 10

## 2016-10-09 MED ORDER — DEXTROSE 5 % IV SOLN: 500.0000 mg | INTRAVENOUS | Status: DC

## 2016-10-09 MED ORDER — HEPARIN BOLUS VIA INFUSION
4000.0000 [IU] | Freq: Once | INTRAVENOUS | Status: AC
  Administered 2016-10-09: 4000 [IU] via INTRAVENOUS
  Filled 2016-10-09: qty 4000

## 2016-10-09 MED ORDER — CLOPIDOGREL BISULFATE 75 MG PO TABS
75.0000 mg | ORAL_TABLET | Freq: Every day | ORAL | Status: DC
  Administered 2016-10-09 – 2016-10-14 (×6): 75 mg via ORAL
  Filled 2016-10-09 (×6): qty 1

## 2016-10-09 MED ORDER — VITAMIN D 1000 UNITS PO TABS
1000.0000 [IU] | ORAL_TABLET | Freq: Every day | ORAL | Status: DC
  Administered 2016-10-09 – 2016-10-14 (×6): 1000 [IU] via ORAL
  Filled 2016-10-09 (×6): qty 1

## 2016-10-09 MED ORDER — ALBUTEROL SULFATE (2.5 MG/3ML) 0.083% IN NEBU: 2.5000 mg | INHALATION_SOLUTION | Freq: Four times a day (QID) | RESPIRATORY_TRACT | Status: DC | PRN

## 2016-10-09 MED ORDER — METOPROLOL SUCCINATE ER 25 MG PO TB24
25.0000 mg | ORAL_TABLET | Freq: Every day | ORAL | Status: DC
  Administered 2016-10-10 – 2016-10-12 (×3): 25 mg via ORAL
  Filled 2016-10-09 (×4): qty 1

## 2016-10-09 MED ORDER — FLUOXETINE HCL 10 MG PO CAPS
10.0000 mg | ORAL_CAPSULE | Freq: Every day | ORAL | Status: DC
  Administered 2016-10-09 – 2016-10-14 (×6): 10 mg via ORAL
  Filled 2016-10-09 (×7): qty 1

## 2016-10-09 MED ORDER — DEXTROSE 5 % IV SOLN
1.0000 g | Freq: Once | INTRAVENOUS | Status: AC
  Administered 2016-10-09: 1 g via INTRAVENOUS
  Filled 2016-10-09: qty 10

## 2016-10-09 MED ORDER — PRAVASTATIN SODIUM 40 MG PO TABS
40.0000 mg | ORAL_TABLET | Freq: Every day | ORAL | Status: DC
  Administered 2016-10-09 – 2016-10-14 (×6): 40 mg via ORAL
  Filled 2016-10-09 (×6): qty 1

## 2016-10-09 MED ORDER — NITROGLYCERIN 0.4 MG SL SUBL: 0.4000 mg | SUBLINGUAL_TABLET | SUBLINGUAL | Status: DC | PRN

## 2016-10-09 MED ORDER — INFLUENZA VAC SPLIT HIGH-DOSE 0.5 ML IM SUSY
0.5000 mL | PREFILLED_SYRINGE | INTRAMUSCULAR | Status: DC
  Filled 2016-10-09: qty 0.5

## 2016-10-09 MED ORDER — HEPARIN (PORCINE) IN NACL 100-0.45 UNIT/ML-% IJ SOLN
1000.0000 [IU]/h | INTRAMUSCULAR | Status: DC
  Administered 2016-10-09 (×2): 1000 [IU]/h via INTRAVENOUS
  Filled 2016-10-09 (×2): qty 250

## 2016-10-09 MED ORDER — DEXTROSE 5 % IV SOLN
500.0000 mg | Freq: Every day | INTRAVENOUS | Status: DC
  Administered 2016-10-10: 500 mg via INTRAVENOUS
  Filled 2016-10-09 (×3): qty 500

## 2016-10-09 MED ORDER — RENA-VITE PO TABS
1.0000 | ORAL_TABLET | Freq: Every day | ORAL | Status: DC
  Administered 2016-10-09 – 2016-10-13 (×5): 1 via ORAL
  Filled 2016-10-09 (×5): qty 1

## 2016-10-09 MED ORDER — HEPARIN (PORCINE) IN NACL 100-0.45 UNIT/ML-% IJ SOLN
1000.0000 [IU]/h | INTRAMUSCULAR | Status: DC
  Administered 2016-10-09: 1000 [IU]/h via INTRAVENOUS
  Filled 2016-10-09: qty 250

## 2016-10-09 MED ORDER — POTASSIUM CHLORIDE CRYS ER 20 MEQ PO TBCR
20.0000 meq | EXTENDED_RELEASE_TABLET | Freq: Every day | ORAL | Status: DC
  Administered 2016-10-09 – 2016-10-11 (×3): 20 meq via ORAL
  Filled 2016-10-09 (×3): qty 1

## 2016-10-09 MED ORDER — NEPHRO-VITE 0.8 MG PO TABS
1.0000 | ORAL_TABLET | Freq: Every day | ORAL | Status: DC
  Filled 2016-10-09: qty 1

## 2016-10-09 MED ORDER — DEXTROSE 5 % IV SOLN
500.0000 mg | Freq: Once | INTRAVENOUS | Status: AC
  Administered 2016-10-09: 500 mg via INTRAVENOUS
  Filled 2016-10-09: qty 500

## 2016-10-09 MED ORDER — PROSIGHT PO TABS
1.0000 | ORAL_TABLET | Freq: Every day | ORAL | Status: DC
  Filled 2016-10-09: qty 1

## 2016-10-09 MED ORDER — DEXTROSE 5 % IV SOLN: 1.0000 g | INTRAVENOUS | Status: DC

## 2016-10-09 MED ORDER — ISOSORBIDE MONONITRATE ER 60 MG PO TB24
120.0000 mg | ORAL_TABLET | Freq: Every day | ORAL | Status: DC
  Administered 2016-10-09 – 2016-10-14 (×6): 120 mg via ORAL
  Filled 2016-10-09 (×2): qty 2
  Filled 2016-10-09: qty 4
  Filled 2016-10-09 (×3): qty 2

## 2016-10-09 NOTE — Progress Notes (Signed)
Will let patient eat since cath not currently planned (see Dr. Marlou Porch' note), patient remains nearly asymptomatic and CP free (despite trop rise).

## 2016-10-09 NOTE — ED Notes (Signed)
Inquired about dinner tray for pt.  Will send tray asap.  Family updated

## 2016-10-09 NOTE — ED Notes (Signed)
CRITICAL VALUE ALERT  Critical Value:  Trop I 3.63

## 2016-10-09 NOTE — Plan of Care (Signed)
Problem: Safety: Goal: Ability to remain free from injury will improve Outcome: Progressing Call bell within reach. Bed alarm on. Family at the bedside.

## 2016-10-09 NOTE — Progress Notes (Addendum)
Andrew [name] for heparin  Indication: chest pain/ACS  No Known Allergies  Patient Measurements: Height: 5\' 8"  (172.7 cm) Weight: 182 lb (82.6 kg) IBW/kg (Calculated) : 68.4 Heparin Dosing Weight: 82.6 kg   Vital Signs: Temp: 98.3 F (36.8 C) (09/18 0625) Temp Source: Oral (09/18 0625) BP: 127/82 (09/18 1115) Pulse Rate: 125 (09/18 1115)  Labs:  Recent Labs  10/09/16 0240 10/09/16 0458 10/09/16 1156  HGB 15.0 15.0  --   HCT 45.1 46.7  --   PLT 147* 131*  --   HEPARINUNFRC  --   --  0.32  CREATININE 1.38*  --   --   TROPONINI 0.33* 3.63*  --     Estimated Creatinine Clearance: 36.5 mL/min (A) (by C-G formula based on SCr of 1.38 mg/dL (H)).   Medical History: Past Medical History:  Diagnosis Date  . Allergic rhinitis   . AORTIC STENOSIS   . CAD, ARTERY BYPASS GRAFT   . CAROTID ARTERY STENOSIS 03/07/2010   80%  . CHF (congestive heart failure) ([place])   . COPD, mild ([place]) 12/22/2010  . GASTROESOPHAGEAL REFLUX DISEASE   . History of colonic polyps    1999, 2004  . History of prostate cancer 2002   s/p treatment with seeds / radiation  . HYPERCHOLESTEROLEMIA   . Lumbar spinal stenosis 07/13/2009  . Macular degeneration (senile) of retina 04/29/2014  . Pancreatic cyst 01/29/2013   Noted on MRI scan from [facility] on 07/27/2011.  I reviewed report at patient request on 01-29-2013.  "Small cystic focus in the posterior pancreatic head.  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  Follow up MRI is advised. The main pancreatic duct remains normal in appearance."  Patient do  . S/P excision of acoustic neuroma   . Skin cancer   . Stroke ([place])   . Thyroid nodule 05/2010   Abnormal biopsy, 83 year old patient and his wife have made informed decision to not pursue surgical resection. Potential risk including cancer has been thoroughly  discussed with the patient.   Assessment: 83 yo male admitted with chest pain. Pharmacy consulted to dose heparin for ACS. Troponin elevated. No oral anticoagulation PTA.  Heparin level therapeutic at 0.32. Hg wnl, Plt 131. No bleed documented.  Goal of Therapy:  Heparin level 0.3-0.7 units/ml Monitor platelets by anticoagulation protocol: Yes   Plan:  Continue heparin gtt at 1000 units/hr 8h heparin level to confirm Daily heparin level and CBC Monitor for s/s bleeding   Elicia Lamp, PharmD Clinical Pharmacist 10/09/16 12:51 PM

## 2016-10-09 NOTE — ED Triage Notes (Signed)
Patient here for evaluation of chest pain that began 2 hours ago.  Has cardiac hx and is on prescription nitro.  Took 3 nitro and symptoms resolved.  EKG for EMS showed elevation no stemi called at this time.  Patient A&Ox4.  324 ASA given prior to arrival.

## 2016-10-09 NOTE — Consult Note (Addendum)
Cardiology Consult    Patient ID: Andrew [name], MRN: [id number], DOB: Jun 12, 1923   Date of Encounter: 10/09/2016, 3:20 AM Consult requested by Dr. Jennette Kettle Primary Care Provider: [name], MD Cardiologist: Nelva Bush Electrophysiologist:  NA  Chief Complaint:  CP  History of Present Illness: Andrew [name] is a 82 y.o. male w/ h/o CAD, h/o CABG and chronic stable angina, moderate AS, chronic diastolic HF, CKD, hx CVA who presents after episode of CP this evening that is different than his prior sx of angina. He was seen by Dr. Saunders Revel 10-01-16 and was diagnosed with CAP based on increasing cough/SOB and other clinical hx and was empirically treated w/ abx. Earlier this evening, he was in bed asleep and noticed a sharp squeezing midsternal pain that lasted about 30 min or so. He tried NTG x 3 that did not bring relief and called EMS. By the time EMS had arrived, the pain had resolved. Pt's EKG was felt to be changed slightly from prior so STEMI was activated. Upon arrival at the ED, pt was pain-free, saying his pain spontaneously resolved. EKG was felt to be not significantly changed from prior and STEMI cancelled. Pt says that the pain he had tonight is different than his prior angina. He had an echo in may 2018 that showed moderate AS. He has remained stable for quite some time on his current antianginal regimen.    Past Medical History:  Diagnosis Date  . Allergic rhinitis   . AORTIC STENOSIS   . CAD, ARTERY BYPASS GRAFT   . CAROTID ARTERY STENOSIS 03/07/2010   80%  . CHF (congestive heart failure) ([place])   . COPD, mild ([place]) 12/22/2010  . GASTROESOPHAGEAL REFLUX DISEASE   . History of colonic polyps    1999, 2004  . History of prostate cancer 2002   s/p treatment with seeds / radiation  . HYPERCHOLESTEROLEMIA   . Lumbar spinal stenosis 07/13/2009  . Macular degeneration (senile) of retina 04/29/2014  . Pancreatic cyst 01/29/2013   Noted on MRI scan from [facility] on 07/27/2011.  I reviewed report at patient request on 01-29-2013.  "Small cystic focus in the posterior pancreatic head.  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  Follow up MRI is advised. The main pancreatic duct remains normal in appearance."  Patient do  . S/P excision of acoustic neuroma   . Skin cancer   . Stroke ([place])   . Thyroid nodule 05/2010   Abnormal biopsy, 83 year old patient and his wife have made informed decision to not pursue surgical resection. Potential risk including cancer has been thoroughly discussed with the patient.    Past Surgical History:  Procedure Laterality Date  . CATARACT EXTRACTION    . CATARACT EXTRACTION  2010  . CHOLECYSTECTOMY    . CORONARY ARTERY BYPASS GRAFT    . CRANIECTOMY FOR EXCISION OF ACOUSTIC NEUROMA  1985  . EYE SURGERY    . SHOULDER SURGERY    . TONSILLECTOMY AND ADENOIDECTOMY  1932  . TOTAL KNEE ARTHROPLASTY    . transperineal implatation of palladium       Prior to Admission medications   Medication Sig Start Date End Date Taking? Authorizing Provider  acetaminophen (TYLENOL) 500 MG tablet Take 500 mg by mouth every 6 (six) hours as needed (pain).    [provider]  albuterol (PROVENTIL HFA;VENTOLIN HFA) 108 (90 Base) MCG/ACT inhaler  Inhale 2 puffs into the lungs every 6 (six) hours as needed for wheezing or shortness of breath. 08/06/16   Copland, Frederico Hamman, MD  B Complex-C-Folic Acid (NEPHRO-VITE PO) Take 1 tablet by mouth daily.      [provider]  cholecalciferol (VITAMIN D) 1000 units tablet Take 1,000 Units by mouth daily.    [provider]  clopidogrel (PLAVIX) 75 MG tablet Take 1 tablet (75 mg total) by mouth daily. 06/28/16   End, Harrell Gave, MD  FLUoxetine (PROZAC) 10 MG tablet Take 1 tablet (10 mg total) by mouth daily. 10/08/16   Copland, Frederico Hamman, MD  furosemide (LASIX) 40 MG tablet Take 1 tablet (40  mg total) by mouth 2 (two) times daily. 10/01/16   End, Harrell Gave, MD  isosorbide mononitrate (IMDUR) 120 MG 24 hr tablet Take 1 tablet (120 mg total) by mouth daily. 01/24/16   Larey Dresser, MD  metoprolol succinate (TOPROL-XL) 25 MG 24 hr tablet Take 25 mg by mouth daily.    [provider]  Multiple Vitamins-Minerals (PRESERVISION/LUTEIN) CAPS Take 1 capsule by mouth 2 (two) times daily.    [provider]  nitroGLYCERIN (NITROSTAT) 0.4 MG SL tablet Place 0.4 mg under the tongue every 5 (five) minutes as needed for chest pain (Max 3 doses with 15 minutes call 911).    [provider]  OVER THE COUNTER MEDICATION Place 1 drop into both eyes daily as needed (dry eyes). OTC lubricating eye drop    [provider]  potassium chloride (K-DUR,KLOR-CON) 10 MEQ tablet TAKE TWO TABLETS BY MOUTH DAILY  08/27/16   End, Harrell Gave, MD  pravastatin (PRAVACHOL) 40 MG tablet TAKE 1 TABLET DAILY  ( CALL TO SCHEDULE AN 6 MONTHS APPOINTMENT FOR FUTURE REFILLS ) 04/19/16   Larey Dresser, MD  predniSONE (DELTASONE) 20 MG tablet 2 tabs po for 4 days, then 1 tab po for 4 days 08/06/16   [name], MD     Allergies: No Known Allergies  Social History:  The patient  reports that he quit smoking about 55 years ago. His smoking use included Cigarettes. He has a 40.00 pack-year smoking history. He has never used smokeless tobacco. He reports that he drinks about 4.2 oz of alcohol per week . He reports that he does not use drugs.   Family History:  The patient's family history includes Alcohol abuse in his unknown relative; Arthritis in his unknown relative; Cancer in his unknown relative; Colon cancer in his sister and sister; Emphysema in his brother; Heart attack in his father; Heart disease in his brother and father; Hypertension in his father; Lung cancer in his brother and daughter; Macular degeneration in his unknown relative; Non-Hodgkin's lymphoma in his sister; Skin  cancer in his sister.   ROS:  Please see the history of present illness.     All other systems reviewed and negative.   Vital Signs: Blood pressure (!) 122/91, pulse 77, resp. rate 18, height 5\' 8"  (1.727 m), weight 82.6 kg (182 lb), SpO2 93 %.  PHYSICAL EXAM: General:  Well nourished, well developed, in no acute distress HEENT: normal Lymph: no adenopathy Neck: no JVD Endocrine:  No thryomegaly Vascular: No carotid bruits; DP pulses not palpable Cardiac:  normal S1, S2; RRR; 2/6 murmur at the base Lungs:  clear to auscultation bilaterally, no wheezing, rhonchi or rales  Abd: soft, nontender, no hepatomegaly  Ext: no edema Musculoskeletal:  No deformities, BUE and BLE strength normal and equal Skin: warm and dry  Neuro:  CNs 2-12 intact, no focal abnormalities noted Psych:  Normal affect   EKG:  NSR with nonspecific IVCD, LAD, LVH  Labs:   Lab Results  Component Value Date   WBC 5.1 03/29/2016   HGB 14.7 03/29/2016   HCT 44.2 03/29/2016   MCV 105.9 (H) 03/29/2016   PLT 101.0 (L) 03/29/2016   No results for input(s): NA, K, CL, CO2, BUN, CREATININE, CALCIUM, PROT, BILITOT, ALKPHOS, ALT, AST, GLUCOSE in the last 168 hours.  Invalid input(s): LABALBU No results for input(s): CKTOTAL, CKMB, TROPONINI in the last 72 hours. Troponin (Point of Care Test)  Recent Labs  10/09/16 0242  TROPIPOC 0.53*    Lab Results  Component Value Date   CHOL 105 03/29/2016   HDL 40.20 03/29/2016   LDLCALC 46 03/29/2016   TRIG 97.0 03/29/2016   No results found for: DDIMER  Radiology/Studies:  Dg Chest 2 View  Result Date: 10/02/2016 CLINICAL DATA:  Dry cough with DOE x 6 months, chest pain, CHF, COPD, CAD, hx CABG EXAM: CHEST  2 VIEW COMPARISON:  06/21/2015 FINDINGS: Status post median sternotomy. Heart is mildly enlarged. There is perihilar peribronchial thickening. More focal opacity is identified at the right lung base and left mid lung zone and right lateral mid lung zone,  compatible with infectious infiltrates. No pleural effusions are present. IMPRESSION: 1. Suspect multifocal infiltrates superimposed on bronchitic changes. Followup PA and lateral chest X-ray is recommended in 3-4 weeks following trial of antibiotic therapy to ensure resolution and exclude underlying malignancy. 2. Stable cardiomegaly. Electronically Signed   By: Nolon Nations M.D.   On: 10/02/2016 13:54    Cardiovascular History & Procedures: Cardiovascular Problems:  Coronary artery disease status post CABG in 1991  Moderate aortic stenosis  Ischemic cardiomyopathy  Cath/PCI:  LHC (2004): One limb of sequential SVG to OM1, OM2, and OM3 occluded.  CV Surgery:  CABG (1991): LIMA to LAD, SVG to PDA, sequential SVG to OM1, OM2, and OM3.  EP Procedures and Devices:  None  Non-Invasive Evaluation(s):  TTE (06/21/16): Mildly dilated left ventricle with mild LVH. LVEF 55-60%. Thickened aortic valve with moderately restricted motion. Mean gradient 22 mmHg. Mild aortic regurgitation and mitral regurgitation. Mild left atrial enlargement. Moderate pulmonic regurgitation. Mild pulmonary hypertension. No significant change from prior echo in 2017. Carotid artery Doppler (05/28/16): 60-79% left and less than 40% right internal carotid artery stenoses.  His last nuc stress test was from 2014: In 12/14, he had a Lexiscan Cardiolite which showed EF 42% with medium-sized scar in the lateral wall along with mild peri-infarct ischemia.  This was deemed a low risk study.    ASSESSMENT AND PLAN:   1. CP: somewhat atypical and may actually be due to CAP. Would also consider PE in the differential. Will cont current treatment for PNA. Repeat CXR. He has chronic stable angina; will cont his home anti-anginal regimen. Trop is minimally positive at this point. Will cycle enzymes. If they trend upward, may need to consider LHC for further evaluation. EF was preserved on most recent TTE.  2.  Dyslipidemia: last LDL controlled at <50. Cont current regimen  3. HTN: BP OK. Cont current regimen  4. PAD: stable. Hx carotid disease  Thank you for the opportunity to participate in the care of this very pleasant patient. Will follow. Please call w/ questions.   Rudean Curt, MD , [facility] 10/09/16 4:15 AM

## 2016-10-09 NOTE — Progress Notes (Addendum)
Progress Note  Patient Name: Andrew [name] Date of Encounter: 10/09/2016  Primary Cardiologist: End  Subjective   No cp, no sob. Wife at bedside  Inpatient Medications    Scheduled Meds: . b complex-vitamin c-folic acid  1 tablet Oral QHS  . cholecalciferol  1,000 Units Oral Daily  . clopidogrel  75 mg Oral Daily  . FLUoxetine  10 mg Oral Daily  . furosemide  40 mg Oral BID  . isosorbide mononitrate  120 mg Oral Daily  . metoprolol succinate  25 mg Oral Daily  . multivitamin  1 tablet Oral Daily  . potassium chloride  20 mEq Oral Daily  . pravastatin  40 mg Oral Daily   Continuous Infusions: . azithromycin    . cefTRIAXone (ROCEPHIN)  IV    . heparin     PRN Meds: albuterol, nitroGLYCERIN   Vital Signs    Vitals:   10/09/16 0625 10/09/16 0630 10/09/16 0700 10/09/16 0831  BP:  133/69 119/79 128/69  Pulse:  79 73 74  Resp:   19 16  Temp: 98.3 F (36.8 C)     TempSrc: Oral     SpO2:  92% 95% 95%  Weight:      Height:        Intake/Output Summary (Last 24 hours) at 10/09/16 0922 Last data filed at 10/09/16 0641  Gross per 24 hour  Intake              600 ml  Output              400 ml  Net              200 ml   Filed Weights   10/09/16 0318  Weight: 182 lb (82.6 kg)    Telemetry    No VT, NSR - Personally Reviewed  ECG    NSR with nonspecific IVCD, LAD, LVH - Personally Reviewed  Physical Exam   GEN: No acute distress.  Elderly Neck: No JVD Cardiac: RRR, 2/6 SM, no rubs, or gallops.  Respiratory: Clear to auscultation bilaterally. GI: Soft, nontender, non-distended  MS: No edema; No deformity. Neuro:  Nonfocal  Psych: Normal affect, pleasant, mildly confused. Thought CP started a few days ago, Wife corrected,   Labs    Chemistry Recent Labs Lab 10/09/16 0240  NA 138  K 4.2  CL 107  CO2 21*  GLUCOSE 121*  BUN 28*  CREATININE 1.38*  CALCIUM 8.6*  PROT 6.3*  ALBUMIN 3.2*  AST 46*  ALT 25  ALKPHOS 89  BILITOT 1.2  GFRNONAA  43*  GFRAA 50*  ANIONGAP 10     Hematology Recent Labs Lab 10/09/16 0240 10/09/16 0458  WBC 9.3 8.9  RBC 4.51 4.63  HGB 15.0 15.0  HCT 45.1 46.7  MCV 100.0 100.9*  MCH 33.3 32.4  MCHC 33.3 32.1  RDW 14.8 15.2  PLT 147* 131*    Cardiac Enzymes Recent Labs Lab 10/09/16 0240 10/09/16 0458  TROPONINI 0.33* 3.63*    Recent Labs Lab 10/09/16 0242  TROPIPOC 0.53*     BNP Recent Labs Lab 10/09/16 0458  BNP 478.7*     DDimer  Recent Labs Lab 10/09/16 0458  DDIMER 1.72*     Radiology    Dg Chest Portable 1 View  Result Date: 10/09/2016 CLINICAL DATA:  Chest pain for 1 day EXAM: PORTABLE CHEST 1 VIEW COMPARISON:  10/02/2016, 06/21/2015 FINDINGS: Post sternotomy changes. Mild cardiomegaly with aortic atherosclerosis. Increased right mid  lung peripheral opacity suspect for a pneumonia. Increased bibasilar opacity. Possible tiny left effusion. No pneumothorax IMPRESSION: 1. Increased bilateral right greater than left pulmonary opacities suspect for worsening pneumonia. Radiographic follow-up to resolution recommended 2. Borderline cardiomegaly.  Possible tiny left effusion. Electronically Signed   By: Donavan Foil M.D.   On: 10/09/2016 03:34    Cardiac Studies    Ischemic cardiomyopathy  Cath/PCI:  LHC (2004): One limb of sequential SVG to OM1, OM2, and OM3 occluded.  CV Surgery:  CABG (1991): LIMA to LAD, SVG to PDA, sequential SVG to OM1, OM2, and OM3.  EP Procedures and Devices:  None  Non-Invasive Evaluation(s):  TTE (06/21/16): Mildly dilated left ventricle with mild LVH. LVEF 55-60%. Thickened aortic valve with moderately restricted motion. Mean gradient 22 mmHg. Mild aortic regurgitation and mitral regurgitation. Mild left atrial enlargement. Moderate pulmonic regurgitation. Mild pulmonary hypertension. No significant change from prior echo in 2017. Carotid artery Doppler (05/28/16): 60-79% left and less than 40% right internal carotid artery  stenoses.  His last nuc stress test was from 2014: In 12/14, he had a Lexiscan Cardiolite which showed EF 42% with medium-sized scar in the lateral wall along with mild peri-infarct ischemia. This was deemed a low risk study.   Patient Profile     83 y.o. male with NSTEMI, prior CABG, moderate AS, mild dementia, DNR  Assessment & Plan    NSTEMI  - Trop 3  - Long discussion with wife and patient. Will continue with medical management. Would be at increased risk for complications with invasive strategy and he is currently CP free, no angina. They are relieved. They understand that with or without cath he is at increased risk in general from CV perspective. They understand.   - IV heparin, Plavix, metoprolol, Imdur, NTG PRN, pravastatin.  CAD  - post CABG  - prior cath reviewed as above  Moderate aortic stenosis  - no change on recent echo, moderate, 36mmHg mean gradient  Dementia  - pleasant currently. Needed redirection from wife  PNA  - per primary team. Recent Z pac. CXR reviewed.   DNR  Critical care time 38min. Spent with family, review of extensive data in this gentleman with MI, aortic valve disease, with potential for CV mortality.    For questions or updates, please contact [place] Please consult [email or web address] for contact info under Cardiology/STEMI.      Signed, Candee Furbish, MD  10/09/2016, 9:22 AM

## 2016-10-09 NOTE — ED Notes (Signed)
CRITICAL VALUE ALERT  Critical Value:Trop 0.33

## 2016-10-09 NOTE — Progress Notes (Signed)
Patient admitted from ED with CP. Denies pain at this time. Family at the bedside. On heparin drip. Oriented to room and surroundings.Telemetry applied and CCMD called.

## 2016-10-09 NOTE — Progress Notes (Addendum)
Agree with HPI and a/p as per Dr. 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--->Baseline creatinine 1.3-1 1.6 Prior EtOH habituation with thrombocytopenia Presumed COPD Bipolar on meds  CAD H/oh = CABG 1991 + moderate A0S, I [place] CM TTE 06/21/16 = EF 55-60, heart with Doppler 05/28/16 = 60-79% L, <40% R ICA  Admitted earlier this morning chest pain diaphoresis SOB Hospital course up-to-date reviewed-appreciate cardiology input Patient actually just seen by cardiologist 10/01/16-at that visit documentation of his cough chillsand the fact that he been Rx inhaler At baseline has 2 pillow orthopnea---has had per cardiology outpatient note issues with nocturia and compliance with Lasix secondary to this Rx's by Dr. END Rx forPNA 2-3 weeks, completed azithromycin 9/14   troponins initially were 0.02 but have peaked to 3.6, BNP is 470 EKG shows no reciprocal changes across precordial leads and no Q waves in other leads Cardiology has seen the patient already in consult this morning-It seems like they are planning cardiac catheterization?  O/e BP 119/79   Pulse 73   Temp 98.3 F (36.8 C) (Oral)   Resp 19   Ht 5\' 8"  (1.727 m)   Wt 82.6 kg (182 lb)   SpO2 95%   BMI 27.67 kg/m   slighlty confused, cannot tell me story about CP--wife adds to h/o Cp started 22:30, did not resolve until niutro x 2 + asa 324..no cp right now.  No diaphor No unilat weak Initial CAD didn't have any cop whatsoever Is able to tell me where he is, what county, what state, what time of year No chest heaviness  P Cath as per Cards Further dipo as per cards--please continue taking him over as you will be cathing him.  Thanks   Verneita Griffes, MD [facility] [phone]

## 2016-10-09 NOTE — Progress Notes (Signed)
ANTICOAGULATION CONSULT NOTE  Pharmacy Consult for heparin  Indication: chest pain/ACS  No Known Allergies  Patient Measurements: Height: 5\' 6"  (167.6 cm) Weight: 177 lb (80.3 kg) IBW/kg (Calculated) : 63.8 Heparin Dosing Weight: 82.6 kg   Vital Signs: Temp: 98.5 F (36.9 C) (09/18 2106) Temp Source: Oral (09/18 2106) BP: 114/101 (09/18 2106) Pulse Rate: 84 (09/18 2106)  Labs:  Recent Labs  10/09/16 0240 10/09/16 0458 10/09/16 1156 10/09/16 1813 10/09/16 2109  HGB 15.0 15.0  --   --   --   HCT 45.1 46.7  --   --   --   PLT 147* 131*  --   --   --   HEPARINUNFRC  --   --  0.32  --  0.38  CREATININE 1.38*  --   --   --   --   TROPONINI 0.33* 3.63* 16.20* 34.18*  --     Estimated Creatinine Clearance: 34.7 mL/min (A) (by C-G formula based on SCr of 1.38 mg/dL (H)).  Assessment: 83 yo male admitted with chest pain. Pharmacy consulted to dose heparin for ACS. Troponin elevated. No oral anticoagulation PTA.  Heparin level remains therapeutic at 0.38. No bleed documented.  Goal of Therapy:  Heparin level 0.3-0.7 units/ml Monitor platelets by anticoagulation protocol: Yes   Plan:  Continue heparin gtt at 1000 units/hr Daily heparin level and CBC Monitor for s/s bleeding   Salome Arnt, PharmD, BCPS 10/09/2016 10:07 PM

## 2016-10-09 NOTE — Plan of Care (Signed)
Problem: Activity: Goal: Risk for activity intolerance will decrease Outcome: Progressing Needs help. Uses walker with one assist.

## 2016-10-09 NOTE — H&P (Signed)
History and Physical    Andrew [name] DGL:[id number] DOB: 04-06-1923 DOA: 10/09/2016  PCP: [name], MD  Patient coming from: Home  I have personally briefly reviewed patient's old medical records in [place]  Chief Complaint: Chest pain  HPI: Andrew [name] is a 83 y.o. male with medical history significant of CAD s/p CABG, moderate AS, diastolic CHF.  Patient presents to the ED with c/o chest pain and SOB.  Took 3 NTG at home and symptoms have since resolved.  Per wife he had very "rattling breath sounds" at home that has also since resolved.  He has been being treated for pneumonia these past couple of weeks which they thought was getting better when he finished the Z-pack on Friday.  Central chest pain and pressure with radiation across entire chest.   ED Course: CXR suggests worsening PNA, WBC 9.3, no fever in ED, trop 0.33.  CP free at this time after 3 SL NTG at home.   Review of Systems: As per HPI otherwise 10 point review of systems negative.   Past Medical History:  Diagnosis Date  . Allergic rhinitis   . AORTIC STENOSIS   . CAD, ARTERY BYPASS GRAFT   . CAROTID ARTERY STENOSIS 03/07/2010   80%  . CHF (congestive heart failure) ([place])   . COPD, mild ([place]) 12/22/2010  . GASTROESOPHAGEAL REFLUX DISEASE   . History of colonic polyps    1999, 2004  . History of prostate cancer 2002   s/p treatment with seeds / radiation  . HYPERCHOLESTEROLEMIA   . Lumbar spinal stenosis 07/13/2009  . Macular degeneration (senile) of retina 04/29/2014  . Pancreatic cyst 01/29/2013   Noted on MRI scan from [facility] on 07/27/2011.  I reviewed report at patient request on 01-29-2013.  "Small cystic focus in the posterior pancreatic head.  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  Follow up MRI is advised. The main pancreatic duct remains normal in appearance."  Patient do  .  S/P excision of acoustic neuroma   . Skin cancer   . Stroke ([place])   . Thyroid nodule 05/2010   Abnormal biopsy, 83 year old patient and his wife have made informed decision to not pursue surgical resection. Potential risk including cancer has been thoroughly discussed with the patient.    Past Surgical History:  Procedure Laterality Date  . CATARACT EXTRACTION    . CATARACT EXTRACTION  2010  . CHOLECYSTECTOMY    . CORONARY ARTERY BYPASS GRAFT    . CRANIECTOMY FOR EXCISION OF ACOUSTIC NEUROMA  1985  . EYE SURGERY    . SHOULDER SURGERY    . TONSILLECTOMY AND ADENOIDECTOMY  1932  . TOTAL KNEE ARTHROPLASTY    . transperineal implatation of palladium       reports that he quit smoking about 55 years ago. His smoking use included Cigarettes. He has a 40.00 pack-year smoking history. He has never used smokeless tobacco. He reports that he drinks about 4.2 oz of alcohol per week . He reports that he does not use drugs.  No Known Allergies  Family History  Problem Relation Age of Onset  . Emphysema Brother   . Lung cancer Brother   . Heart disease Father   . Hypertension Father   . Heart attack Father   . Colon cancer Sister   . Non-Hodgkin's lymphoma Sister   . Heart disease Brother   .  Colon cancer Sister   . Skin cancer Sister   . Alcohol abuse Unknown   . Arthritis Unknown   . Cancer Unknown   . Macular degeneration Unknown   . Lung cancer Daughter      Prior to Admission medications   Medication Sig Start Date End Date Taking? Authorizing Provider  acetaminophen (TYLENOL) 500 MG tablet Take 500 mg by mouth every 6 (six) hours as needed (pain).   Yes [provider]  albuterol (PROVENTIL HFA;VENTOLIN HFA) 108 (90 Base) MCG/ACT inhaler Inhale 2 puffs into the lungs every 6 (six) hours as needed for wheezing or shortness of breath. 08/06/16  Yes Copland, Frederico Hamman, MD  B Complex-C-Folic Acid (NEPHRO-VITE PO) Take 1 tablet by mouth daily.     Yes [provider]   cholecalciferol (VITAMIN D) 1000 units tablet Take 1,000 Units by mouth daily.   Yes [provider]  clopidogrel (PLAVIX) 75 MG tablet Take 1 tablet (75 mg total) by mouth daily. 06/28/16  Yes End, Harrell Gave, MD  FLUoxetine (PROZAC) 10 MG tablet Take 1 tablet (10 mg total) by mouth daily. 10/08/16  Yes Copland, Frederico Hamman, MD  furosemide (LASIX) 40 MG tablet Take 1 tablet (40 mg total) by mouth 2 (two) times daily. 10/01/16  Yes End, Harrell Gave, MD  isosorbide mononitrate (IMDUR) 120 MG 24 hr tablet Take 1 tablet (120 mg total) by mouth daily. 01/24/16  Yes Larey Dresser, MD  metoprolol succinate (TOPROL-XL) 25 MG 24 hr tablet Take 25 mg by mouth daily.   Yes [provider]  Multiple Vitamins-Minerals (PRESERVISION/LUTEIN) CAPS Take 1 capsule by mouth 2 (two) times daily.   Yes [provider]  nitroGLYCERIN (NITROSTAT) 0.4 MG SL tablet Place 0.4 mg under the tongue every 5 (five) minutes as needed for chest pain (Max 3 doses with 15 minutes call 911).   Yes [provider]  OVER THE COUNTER MEDICATION Place 1 drop into both eyes daily as needed (dry eyes). OTC lubricating eye drop   Yes [provider]  potassium chloride (K-DUR,KLOR-CON) 10 MEQ tablet TAKE TWO TABLETS BY MOUTH DAILY  Patient taking differently: TAKE 20MEQ BY MOUTH ONCE DAILY 08/27/16  Yes End, Harrell Gave, MD  pravastatin (PRAVACHOL) 40 MG tablet TAKE 1 TABLET DAILY  ( CALL TO SCHEDULE AN 6 MONTHS APPOINTMENT FOR FUTURE REFILLS ) 04/19/16  Yes Larey Dresser, MD    Physical Exam: Vitals:   10/09/16 0300 10/09/16 0315 10/09/16 0318 10/09/16 0330  BP: (!) 122/91 130/61  118/62  Pulse: 77 74  72  Resp: 18 (!) 22  18  SpO2: 93% 93%  92%  Weight:   82.6 kg (182 lb)   Height:   5\' 8"  (1.727 m)     Constitutional: NAD, calm, comfortable Eyes: PERRL, lids and conjunctivae normal ENMT: Mucous membranes are moist. Posterior pharynx clear of any exudate or lesions.Normal dentition.    Neck: normal, supple, no masses, no thyromegaly Respiratory: clear to auscultation bilaterally, no wheezing, no crackles. Normal respiratory effort. No accessory muscle use.  Cardiovascular: Regular rate and rhythm, no murmurs / rubs / gallops. No extremity edema. 2+ pedal pulses. No carotid bruits.  Abdomen: no tenderness, no masses palpated. No hepatosplenomegaly. Bowel sounds positive.  Musculoskeletal: no clubbing / cyanosis. No joint deformity upper and lower extremities. Good ROM, no contractures. Normal muscle tone.  Skin: no rashes, lesions, ulcers. No induration Neurologic: CN 2-12 grossly intact. Sensation intact, DTR normal. Strength 5/5 in all 4.  Psychiatric: Normal judgment  and insight. Alert and oriented x 3. Normal mood.    Labs on Admission: I have personally reviewed following labs and imaging studies  CBC:  Recent Labs Lab 10/09/16 0240  WBC 9.3  NEUTROABS 6.7  HGB 15.0  HCT 45.1  MCV 100.0  PLT 638*   Basic Metabolic Panel:  Recent Labs Lab 10/09/16 0240  NA 138  K 4.2  CL 107  CO2 21*  GLUCOSE 121*  BUN 28*  CREATININE 1.38*  CALCIUM 8.6*   GFR: Estimated Creatinine Clearance: 36.5 mL/min (A) (by C-G formula based on SCr of 1.38 mg/dL (H)). Liver Function Tests:  Recent Labs Lab 10/09/16 0240  AST 46*  ALT 25  ALKPHOS 89  BILITOT 1.2  PROT 6.3*  ALBUMIN 3.2*    Recent Labs Lab 10/09/16 0240  LIPASE 31   No results for input(s): AMMONIA in the last 168 hours. Coagulation Profile: No results for input(s): INR, PROTIME in the last 168 hours. Cardiac Enzymes:  Recent Labs Lab 10/09/16 0240  TROPONINI 0.33*   BNP (last 3 results) No results for input(s): PROBNP in the last 8760 hours. HbA1C: No results for input(s): HGBA1C in the last 72 hours. CBG: No results for input(s): GLUCAP in the last 168 hours. Lipid Profile: No results for input(s): CHOL, HDL, LDLCALC, TRIG, CHOLHDL, LDLDIRECT in the last 72 hours. Thyroid  Function Tests: No results for input(s): TSH, T4TOTAL, FREET4, T3FREE, THYROIDAB in the last 72 hours. Anemia Panel: No results for input(s): VITAMINB12, FOLATE, FERRITIN, TIBC, IRON, RETICCTPCT in the last 72 hours. Urine analysis:    Component Value Date/Time   COLORURINE YELLOW 06/29/2015 [place] 06/29/2015 1451   LABSPEC 1.014 06/29/2015 1451   PHURINE 5.5 06/29/2015 1451   GLUCOSEU NEGATIVE 06/29/2015 1451   GLUCOSEU NEGATIVE 07/05/2009 0858   HGBUR NEGATIVE 06/29/2015 1451   BILIRUBINUR NEGATIVE 06/29/2015 1451   KETONESUR NEGATIVE 06/29/2015 1451   PROTEINUR NEGATIVE 06/29/2015 1451   UROBILINOGEN 0.2 07/05/2009 0858   NITRITE NEGATIVE 06/29/2015 1451   LEUKOCYTESUR NEGATIVE 06/29/2015 1451    Radiological Exams on Admission: Dg Chest Portable 1 View  Result Date: 10/09/2016 CLINICAL DATA:  Chest pain for 1 day EXAM: PORTABLE CHEST 1 VIEW COMPARISON:  10/02/2016, 06/21/2015 FINDINGS: Post sternotomy changes. Mild cardiomegaly with aortic atherosclerosis. Increased right mid lung peripheral opacity suspect for a pneumonia. Increased bibasilar opacity. Possible tiny left effusion. No pneumothorax IMPRESSION: 1. Increased bilateral right greater than left pulmonary opacities suspect for worsening pneumonia. Radiographic follow-up to resolution recommended 2. Borderline cardiomegaly.  Possible tiny left effusion. Electronically Signed   By: Donavan Foil M.D.   On: 10/09/2016 03:34    EKG: Independently reviewed.  Assessment/Plan Principal Problem:   CAP (community acquired pneumonia) Active Problems:   Chronic diastolic CHF (congestive heart failure) (H[facility])   Essential hypertension   Coronary artery disease of native artery of native heart with stable angina pectoris ([place])   NSTEMI (non-ST elevated myocardial infarction) ([place])    1. CAP -  1. worsening infiltrate on CXR, will treat for cap, although odd that his symptoms resolved / improved so  rapidly. 2. PNA pathway 3. Rocephin / azithro 4. Cultures pending 2. NSTEMI - 1. Could have type 2 NSTEMI secondary to CAP, or alternatively the worsening infiltrate on CXR could represent pulmonary edema due to primary NSTEMI 2. Once again though, it seems odd that symptoms would go from flash pulmonary edema to completely resolved so rapidly with just NTG intervention. 3.  Cards consult 4. Serial trops 5. Heparin gtt 6. Tele monitor 7. Apparently had significant difficulty with cath previously, looks like Atheroemboli from one of Dr. Maren Beach 03/07/2010 office notes. 3. HTN - continue home BP meds 4. CHF - continue lasix BID  DVT prophylaxis: Lovenox Code Status: DNR Family Communication: Wife at bedside Disposition Plan: Home after admit Consults called: Cards Admission status: Admit to inpatient   [place], [place] [facility] Pager [phone]  If 7AM-7PM, please contact day team taking care of patient [email or web address] Password [email or web address]  10/09/2016, 5:00 AM

## 2016-10-09 NOTE — Progress Notes (Addendum)
ANTICOAGULATION CONSULT NOTE - Initial Consult  Pharmacy Consult for heparin  Indication: chest pain/ACS  No Known Allergies  Patient Measurements: Height: 5\' 8"  (172.7 cm) Weight: 182 lb (82.6 kg) IBW/kg (Calculated) : 68.4 Heparin Dosing Weight: 82.6 kg   Vital Signs: BP: 130/61 (09/18 0315) Pulse Rate: 74 (09/18 0315)  Labs: No results for input(s): HGB, HCT, PLT, APTT, LABPROT, INR, HEPARINUNFRC, HEPRLOWMOCWT, CREATININE, CKTOTAL, CKMB, TROPONINI in the last 72 hours.  CrCl cannot be calculated (Patient's most recent lab result is older than the maximum 21 days allowed.).   Medical History: Past Medical History:  Diagnosis Date  . Allergic rhinitis   . AORTIC STENOSIS   . CAD, ARTERY BYPASS GRAFT   . CAROTID ARTERY STENOSIS 03/07/2010   80%  . CHF (congestive heart failure) ([place])   . COPD, mild ([place]) 12/22/2010  . GASTROESOPHAGEAL REFLUX DISEASE   . History of colonic polyps    1999, 2004  . History of prostate cancer 2002   s/p treatment with seeds / radiation  . HYPERCHOLESTEROLEMIA   . Lumbar spinal stenosis 07/13/2009  . Macular degeneration (senile) of retina 04/29/2014  . Pancreatic cyst 01/29/2013   Noted on MRI scan from [facility] on 07/27/2011.  I reviewed report at patient request on 01-29-2013.  "Small cystic focus in the posterior pancreatic head.  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  Follow up MRI is advised. The main pancreatic duct remains normal in appearance."  Patient do  . S/P excision of acoustic neuroma   . Skin cancer   . Stroke ([place])   . Thyroid nodule 05/2010   Abnormal biopsy, 83 year old patient and his wife have made informed decision to not pursue surgical resection. Potential risk including cancer has been thoroughly discussed with the patient.   Assessment: 83 yo male admitted with chest pain. Pharmacy consulted to dose heparin  for ACS. No oral anticoagulation PTA per RN report.  Noted troponin elevated at 0.53. Hgb stable and platelets slightly low at 147. No s/s bleeding noted.   History of CVA/TIA. Spoke with MD who reports these occurred in the past several years and are not recent. Per review of records, noted documentation of stroke in 2017 and TIA in 2015.   Goal of Therapy:  Heparin level 0.3-0.7 units/ml Monitor platelets by anticoagulation protocol: Yes   Plan:  Heparin bolus 4000 units IV x1 Start heparin gtt at 1000 units/hr Heparin level in 8 hrs Daily heparin level and CBC Monitor for s/s bleeding   Argie Ramming, PharmD Clinical Pharmacist 10/09/16 4:14 AM

## 2016-10-09 NOTE — ED Provider Notes (Signed)
[place] DEPT Provider Note   CSN: [id number] Arrival date & time: 10/09/16  0240     History   Chief Complaint Chief Complaint  Patient presents with  . Chest Pain    HPI Andrew [name] is a 83 y.o. male.  Patient presents with chest pain by EMS. This came on while he was lying down. Onset about 11 PM. History of CABG remotely. He took 3 nitroglycerin at home and symptoms have since resolved. EMS reports ST elevation in lead 3. Patient states he felt central chest pain and pressure that radiated across his entire chest. Associated with shortness of breath. No nausea, vomiting or diaphoresis. No back pain or abdominal pain. He states he's never had this kind of pain before. Per EMS, patient treated for pneumonia last week and finished a course of Zithromax.   The history is provided by the patient and the EMS personnel.  Chest Pain   Associated symptoms include shortness of breath. Pertinent negatives include no abdominal pain, no dizziness, no fever, no headaches, no nausea, no vomiting and no weakness.    Past Medical History:  Diagnosis Date  . Allergic rhinitis   . AORTIC STENOSIS   . CAD, ARTERY BYPASS GRAFT   . CAROTID ARTERY STENOSIS 03/07/2010   80%  . CHF (congestive heart failure) ([place])   . COPD, mild ([place]) 12/22/2010  . GASTROESOPHAGEAL REFLUX DISEASE   . History of colonic polyps    1999, 2004  . History of prostate cancer 2002   s/p treatment with seeds / radiation  . HYPERCHOLESTEROLEMIA   . Lumbar spinal stenosis 07/13/2009  . Macular degeneration (senile) of retina 04/29/2014  . Pancreatic cyst 01/29/2013   Noted on MRI scan from [facility] on 07/27/2011.  I reviewed report at patient request on 01-29-2013.  "Small cystic focus in the posterior pancreatic head.  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  Follow up MRI is advised. The main pancreatic duct  remains normal in appearance."  Patient do  . S/P excision of acoustic neuroma   . Skin cancer   . Stroke ([employer])   . Thyroid nodule 05/2010   Abnormal biopsy, 83 year old patient and his wife have made informed decision to not pursue surgical resection. Potential risk including cancer has been thoroughly discussed with the patient.    Patient Active Problem List   Diagnosis Date Noted  . Cough 10/01/2016  . Chills 10/01/2016  . History of stroke 10/01/2016  . Coronary artery disease of native artery of native heart with stable angina pectoris ([place]) 06/28/2016  . Bilateral carotid artery stenosis 06/28/2016  . Hyperlipidemia LDL goal <70 06/28/2016  . Advanced Planning: DNR 04/09/2016  . Essential hypertension   . Alcohol abuse   . Thrombocytopenia ([place])   . Right-sided cerebrovascular accident (CVA) ([place])   . Chronic renal insufficiency, stage III (moderate) 06/02/2014  . Macular degeneration (senile) of retina 04/29/2014  . Major depressive disorder, single episode 06/11/2013  . TIA (transient ischemic attack) 06/03/2013  . Pancreatic cyst 01/29/2013  . OSA (obstructive sleep apnea) 12/26/2012  . Chronic diastolic CHF (congestive heart failure) ([place]) 08/19/2012  . COPD, minimal-mild 12/22/2010  . S/P excision of acoustic neuroma 06/18/2010  . Pulmonary nodule 06/17/2010  . Vertigo 06/14/2010  . THYROID NODULE 03/13/2010  . PVD- moderate carotid disease 03/07/2010  . Spinal stenosis, lumbar region, with neurogenic claudication 07/13/2009  . ALLERGIC  RHINITIS 09/01/2008  . COLONIC POLYPS, HX OF 09/01/2008  . HYPERCHOLESTEROLEMIA 06/18/2008  . Angina, class II ([place]) 06/18/2008  . Hx of CABG 06/18/2008  . Moderate aortic stenosis 06/18/2008  . GASTROESOPHAGEAL REFLUX DISEASE 06/18/2008    Past Surgical History:  Procedure Laterality Date  . CATARACT EXTRACTION    . CATARACT EXTRACTION  2010  . CHOLECYSTECTOMY    . CORONARY ARTERY BYPASS GRAFT    . CRANIECTOMY FOR EXCISION  OF ACOUSTIC NEUROMA  1985  . EYE SURGERY    . SHOULDER SURGERY    . TONSILLECTOMY AND ADENOIDECTOMY  1932  . TOTAL KNEE ARTHROPLASTY    . transperineal implatation of palladium         Home Medications    Prior to Admission medications   Medication Sig Start Date End Date Taking? Authorizing Provider  acetaminophen (TYLENOL) 500 MG tablet Take 500 mg by mouth every 6 (six) hours as needed (pain).    [provider]  albuterol (PROVENTIL HFA;VENTOLIN HFA) 108 (90 Base) MCG/ACT inhaler Inhale 2 puffs into the lungs every 6 (six) hours as needed for wheezing or shortness of breath. 08/06/16   Copland, Frederico Hamman, MD  B Complex-C-Folic Acid (NEPHRO-VITE PO) Take 1 tablet by mouth daily.      [provider]  cholecalciferol (VITAMIN D) 1000 units tablet Take 1,000 Units by mouth daily.    [provider]  clopidogrel (PLAVIX) 75 MG tablet Take 1 tablet (75 mg total) by mouth daily. 06/28/16   End, Harrell Gave, MD  FLUoxetine (PROZAC) 10 MG tablet Take 1 tablet (10 mg total) by mouth daily. 10/08/16   Copland, Frederico Hamman, MD  furosemide (LASIX) 40 MG tablet Take 1 tablet (40 mg total) by mouth 2 (two) times daily. 10/01/16   End, Harrell Gave, MD  isosorbide mononitrate (IMDUR) 120 MG 24 hr tablet Take 1 tablet (120 mg total) by mouth daily. 01/24/16   Larey Dresser, MD  metoprolol succinate (TOPROL-XL) 25 MG 24 hr tablet Take 25 mg by mouth daily.    [provider]  Multiple Vitamins-Minerals (PRESERVISION/LUTEIN) CAPS Take 1 capsule by mouth 2 (two) times daily.    [provider]  nitroGLYCERIN (NITROSTAT) 0.4 MG SL tablet Place 0.4 mg under the tongue every 5 (five) minutes as needed for chest pain (Max 3 doses with 15 minutes call 911).    [provider]  OVER THE COUNTER MEDICATION Place 1 drop into both eyes daily as needed (dry eyes). OTC lubricating eye drop    [provider]  potassium chloride (K-DUR,KLOR-CON) 10 MEQ tablet TAKE  TWO TABLETS BY MOUTH DAILY  08/27/16   End, Harrell Gave, MD  pravastatin (PRAVACHOL) 40 MG tablet TAKE 1 TABLET DAILY  ( CALL TO SCHEDULE AN 6 MONTHS APPOINTMENT FOR FUTURE REFILLS ) 04/19/16   Larey Dresser, MD  predniSONE (DELTASONE) 20 MG tablet 2 tabs po for 4 days, then 1 tab po for 4 days 08/06/16   [name], MD    Family History Family History  Problem Relation Age of Onset  . Emphysema Brother   . Lung cancer Brother   . Heart disease Father   . Hypertension Father   . Heart attack Father   . Colon cancer Sister   . Non-Hodgkin's lymphoma Sister   . Heart disease Brother   . Colon cancer Sister   . Skin cancer Sister   . Alcohol abuse Unknown   . Arthritis Unknown   . Cancer Unknown   .  Macular degeneration Unknown   . Lung cancer Daughter     Social History Social History  Substance Use Topics  . Smoking status: Former Smoker    Packs/day: 2.00    Years: 20.00    Types: Cigarettes    Quit date: 01/22/1961  . Smokeless tobacco: Never Used  . Alcohol use 4.2 oz/week    7 Shots of liquor per week     Comment: 1 drink nightly     Allergies   Patient has no known allergies.   Review of Systems Review of Systems  Constitutional: Negative for activity change, appetite change, fatigue and fever.  HENT: Negative for congestion and rhinorrhea.   Respiratory: Positive for chest tightness and shortness of breath.   Cardiovascular: Positive for chest pain.  Gastrointestinal: Negative for abdominal pain, nausea and vomiting.  Genitourinary: Negative for dysuria, hematuria, testicular pain and urgency.  Musculoskeletal: Negative for arthralgias and myalgias.  Skin: Negative for rash.  Neurological: Negative for dizziness, weakness, light-headedness and headaches.   all other systems are negative except as noted in the HPI and PMH.     Physical Exam Updated Vital Signs BP 118/62   Pulse 72   Resp 18   Ht 5\' 8"  (1.727 m)   Wt 82.6 kg (182 lb)   SpO2 92%    BMI 27.67 kg/m   Physical Exam  Constitutional: He is oriented to person, place, and time. He appears well-developed and well-nourished. No distress.  HENT:  Head: Normocephalic and atraumatic.  Mouth/Throat: Oropharynx is clear and moist. No oropharyngeal exudate.  Eyes: Pupils are equal, round, and reactive to light. Conjunctivae and EOM are normal.  Neck: Normal range of motion. Neck supple.  No meningismus.  Cardiovascular: Normal rate, regular rhythm and intact distal pulses.   Murmur heard. Systolic murmur  Pulmonary/Chest: Effort normal and breath sounds normal. No respiratory distress. He exhibits no tenderness.  Abdominal: Soft. There is no tenderness. There is no rebound and no guarding.  Musculoskeletal: Normal range of motion. He exhibits no edema or tenderness.  Neurological: He is alert and oriented to person, place, and time. No cranial nerve deficit. He exhibits normal muscle tone. Coordination normal.   5/5 strength throughout. CN 2-12 intact.Equal grip strength.   Skin: Skin is warm.  Psychiatric: He has a normal mood and affect. His behavior is normal.  Nursing note and vitals reviewed.    ED Treatments / Results  Labs (all labs ordered are listed, but only abnormal results are displayed) Labs Reviewed  CBC WITH DIFFERENTIAL/PLATELET - Abnormal; Notable for the following:       Result Value   Platelets 147 (*)    All other components within normal limits  COMPREHENSIVE METABOLIC PANEL - Abnormal; Notable for the following:    CO2 21 (*)    Glucose, Bld 121 (*)    BUN 28 (*)    Creatinine, Ser 1.38 (*)    Calcium 8.6 (*)    Total Protein 6.3 (*)    Albumin 3.2 (*)    AST 46 (*)    GFR calc non Af Amer 43 (*)    GFR calc Af Amer 50 (*)    All other components within normal limits  TROPONIN I - Abnormal; Notable for the following:    Troponin I 0.33 (*)    All other components within normal limits  I-STAT TROPONIN, ED - Abnormal; Notable for the  following:    Troponin i, poc 0.53 (*)  All other components within normal limits  CULTURE, BLOOD (ROUTINE X 2)  CULTURE, BLOOD (ROUTINE X 2)  CULTURE, EXPECTORATED SPUTUM-ASSESSMENT  GRAM STAIN  LIPASE, BLOOD  HEPARIN LEVEL (UNFRACTIONATED)  CBC  D-DIMER, QUANTITATIVE (NOT AT [facility])  TROPONIN I  TROPONIN I  TROPONIN I  HIV ANTIBODY (ROUTINE TESTING)  STREP PNEUMONIAE URINARY ANTIGEN  I-STAT TROPONIN, ED  I-STAT CG4 LACTIC ACID, ED    EKG  EKG Interpretation  Date/Time:  Tuesday October 09 2016 02:49:53 EDT Ventricular Rate:  77 PR Interval:    QRS Duration: 132 QT Interval:  431 QTC Calculation: 488 R Axis:   -51 Text Interpretation:  Sinus rhythm Nonspecific IVCD with LAD LVH with secondary repolarization abnormality T wave changes improved Confirmed by Ezequiel Essex [phone]) on 10/09/2016 3:01:14 AM       Radiology Dg Chest Portable 1 View  Result Date: 10/09/2016 CLINICAL DATA:  Chest pain for 1 day EXAM: PORTABLE CHEST 1 VIEW COMPARISON:  10/02/2016, 06/21/2015 FINDINGS: Post sternotomy changes. Mild cardiomegaly with aortic atherosclerosis. Increased right mid lung peripheral opacity suspect for a pneumonia. Increased bibasilar opacity. Possible tiny left effusion. No pneumothorax IMPRESSION: 1. Increased bilateral right greater than left pulmonary opacities suspect for worsening pneumonia. Radiographic follow-up to resolution recommended 2. Borderline cardiomegaly.  Possible tiny left effusion. Electronically Signed   By: Donavan Foil M.D.   On: 10/09/2016 03:34    Procedures Procedures (including critical care time)  Medications Ordered in ED Medications - No data to display   Initial Impression / Assessment and Plan / ED Course  I have reviewed the triage vital signs and the nursing notes.  Pertinent labs & imaging results that were available during my care of the patient were reviewed by me and considered in my medical decision making (see chart for  details).    Patient presents with chest pain, now resolved. EMS EKG showed ST elevation in lead 3. EKG on arrival here shows no ST elevation. Discussed with Dr. Hassell Done and Carney Living of cardiology and code STEMI canceled.  Troponin is positive at 0.53. Patient with no further chest pain. Feels back to baseline. Chest x-ray concerning for persistent pneumonia.  Antibiotics given. We'll also start heparin given NSTEMI.  Discussed with Dr. Hassell Done of cardiology who will evaluate. Recommends medical admission. D/w Dr. Alcario Drought  D-dimer pending given borderline O2 saturations. BP and mental status stable.  No further chest pain. Patient and family updated.   CRITICAL CARE Performed by: Ezequiel Essex Total critical care time: 35 minutes Critical care time was exclusive of separately billable procedures and treating other patients. Critical care was necessary to treat or prevent imminent or life-threatening deterioration. Critical care was time spent personally by me on the following activities: development of treatment plan with patient and/or surrogate as well as nursing, discussions with consultants, evaluation of patient's response to treatment, examination of patient, obtaining history from patient or surrogate, ordering and performing treatments and interventions, ordering and review of laboratory studies, ordering and review of radiographic studies, pulse oximetry and re-evaluation of patient's condition.   Final Clinical Impressions(s) / ED Diagnoses   Final diagnoses:  Community acquired pneumonia, unspecified laterality  Chest pain, unspecified type    New Prescriptions New Prescriptions   No medications on file     Ezequiel Essex, MD 10/09/16 [phone]

## 2016-10-10 LAB — CBC
HCT: 40.7 % (ref 39.0–52.0)
Hemoglobin: 13.6 g/dL (ref 13.0–17.0)
MCH: 33.1 pg (ref 26.0–34.0)
MCHC: 33.4 g/dL (ref 30.0–36.0)
MCV: 99 fL (ref 78.0–100.0)
PLATELETS: 130 10*3/uL — AB (ref 150–400)
RBC: 4.11 MIL/uL — AB (ref 4.22–5.81)
RDW: 15 % (ref 11.5–15.5)
WBC: 8.6 10*3/uL (ref 4.0–10.5)

## 2016-10-10 LAB — PROCALCITONIN

## 2016-10-10 LAB — HEPARIN LEVEL (UNFRACTIONATED): HEPARIN UNFRACTIONATED: 0.32 [IU]/mL (ref 0.30–0.70)

## 2016-10-10 LAB — MRSA PCR SCREENING: MRSA BY PCR: NEGATIVE

## 2016-10-10 MED ORDER — METOPROLOL TARTRATE 5 MG/5ML IV SOLN
5.0000 mg | INTRAVENOUS | Status: DC | PRN
  Administered 2016-10-10: 5 mg via INTRAVENOUS
  Filled 2016-10-10: qty 5

## 2016-10-10 MED ORDER — HEPARIN (PORCINE) IN NACL 100-0.45 UNIT/ML-% IJ SOLN
1150.0000 [IU]/h | INTRAMUSCULAR | Status: DC
  Administered 2016-10-10: 1050 [IU]/h via INTRAVENOUS
  Administered 2016-10-11: 1150 [IU]/h via INTRAVENOUS
  Filled 2016-10-10 (×2): qty 250

## 2016-10-10 NOTE — Progress Notes (Signed)
Progress Note  Patient Name: Andrew [name] Date of Encounter: 10/10/2016  Primary Cardiologist: End  Subjective   Heart rate increased (possible atrial flutter vs. Sinus tachy), no sob. Wife at bedside. Needed to transfer to comode. Stated that he was having chest pain currently with tachycardia.   Inpatient Medications    Scheduled Meds: . cholecalciferol  1,000 Units Oral Daily  . clopidogrel  75 mg Oral Daily  . FLUoxetine  10 mg Oral Daily  . furosemide  40 mg Oral BID  . Influenza vac split quadrivalent PF  0.5 mL Intramuscular Tomorrow-1000  . isosorbide mononitrate  120 mg Oral Daily  . metoprolol succinate  25 mg Oral Daily  . multivitamin  1 tablet Oral QHS  . potassium chloride  20 mEq Oral Daily  . pravastatin  40 mg Oral Daily   Continuous Infusions: . azithromycin    . cefTRIAXone (ROCEPHIN)  IV    . heparin 1,000 Units/hr (10/09/16 2207)   PRN Meds: albuterol, nitroGLYCERIN   Vital Signs    Vitals:   10/10/16 0300 10/10/16 0400 10/10/16 0425 10/10/16 0800  BP:  123/74 116/76 109/76  Pulse:   98   Resp: (!) 28 (!) 31 16   Temp:   98.7 F (37.1 C)   TempSrc:   Oral   SpO2: 95% 90% 92% 91%  Weight:      Height:        Intake/Output Summary (Last 24 hours) at 10/10/16 1001 Last data filed at 10/10/16 0800  Gross per 24 hour  Intake               10 ml  Output             1115 ml  Net            -1105 ml   Filed Weights   10/09/16 0318 10/09/16 2106  Weight: 182 lb (82.6 kg) 177 lb (80.3 kg)    Telemetry    Atrial tachy vs flutter at 140 with occasional couplets this AM - Personally Reviewed  ECG    NSR with nonspecific IVCD, LAD, LVH - Personally Reviewed  Physical Exam   GEN: No acute distress.  Elderly Neck: No JVD Cardiac: Tachy reg with occas ectopy, 2/6 SM, no rubs, or gallops.  Respiratory: Clear to auscultation bilaterally. GI: Soft, nontender, non-distended  MS: No edema; No deformity. Neuro:  Nonfocal  Psych: Mildly  confused. Wife helping   Labs    Chemistry  Recent Labs Lab 10/09/16 0240  NA 138  K 4.2  CL 107  CO2 21*  GLUCOSE 121*  BUN 28*  CREATININE 1.38*  CALCIUM 8.6*  PROT 6.3*  ALBUMIN 3.2*  AST 46*  ALT 25  ALKPHOS 89  BILITOT 1.2  GFRNONAA 43*  GFRAA 50*  ANIONGAP 10     Hematology  Recent Labs Lab 10/09/16 0240 10/09/16 0458 10/10/16 0243  WBC 9.3 8.9 8.6  RBC 4.51 4.63 4.11*  HGB 15.0 15.0 13.6  HCT 45.1 46.7 40.7  MCV 100.0 100.9* 99.0  MCH 33.3 32.4 33.1  MCHC 33.3 32.1 33.4  RDW 14.8 15.2 15.0  PLT 147* 131* 130*    Cardiac Enzymes  Recent Labs Lab 10/09/16 0240 10/09/16 0458 10/09/16 1156 10/09/16 1813  TROPONINI 0.33* 3.63* 16.20* 34.18*     Recent Labs Lab 10/09/16 0242  TROPIPOC 0.53*     BNP  Recent Labs Lab 10/09/16 0458  BNP 478.7*  DDimer   Recent Labs Lab 10/09/16 0458  DDIMER 1.72*     Radiology    Dg Chest Portable 1 View  Result Date: 10/09/2016 CLINICAL DATA:  Chest pain for 1 day EXAM: PORTABLE CHEST 1 VIEW COMPARISON:  10/02/2016, 06/21/2015 FINDINGS: Post sternotomy changes. Mild cardiomegaly with aortic atherosclerosis. Increased right mid lung peripheral opacity suspect for a pneumonia. Increased bibasilar opacity. Possible tiny left effusion. No pneumothorax IMPRESSION: 1. Increased bilateral right greater than left pulmonary opacities suspect for worsening pneumonia. Radiographic follow-up to resolution recommended 2. Borderline cardiomegaly.  Possible tiny left effusion. Electronically Signed   By: Donavan Foil M.D.   On: 10/09/2016 03:34    Cardiac Studies    Ischemic cardiomyopathy  Cath/PCI:  LHC (2004): One limb of sequential SVG to OM1, OM2, and OM3 occluded.  CV Surgery:  CABG (1991): LIMA to LAD, SVG to PDA, sequential SVG to OM1, OM2, and OM3.  EP Procedures and Devices:  None  Non-Invasive Evaluation(s):  TTE (06/21/16): Mildly dilated left ventricle with mild LVH. LVEF  55-60%. Thickened aortic valve with moderately restricted motion. Mean gradient 22 mmHg. Mild aortic regurgitation and mitral regurgitation. Mild left atrial enlargement. Moderate pulmonic regurgitation. Mild pulmonary hypertension. No significant change from prior echo in 2017. Carotid artery Doppler (05/28/16): 60-79% left and less than 40% right internal carotid artery stenoses.  His last nuc stress test was from 2014: In 12/14, he had a Lexiscan Cardiolite which showed EF 42% with medium-sized scar in the lateral wall along with mild peri-infarct ischemia. This was deemed a low risk study.   Patient Profile     83 y.o. male with NSTEMI, prior CABG, moderate AS, mild dementia, DNR  Assessment & Plan    Atrial tachycardia/flutter  - check ECG  - will give metoprolol IV 5 x 3  - continue metoprolol PO  - next step would be AMIO  - decreasing HR would help with angina.    NSTEMI  - Trop 0.3>3>30  - Long discussion with wife and patient. Was CP free in ER and overnight. This morning with tachycardia he is feeling angina. We discussed once again given his increase in Troponin and now CP that we will try to decrease HR and help with ANGINA. Once again discussed invasive approach and given his wife shook head no again and this is fine. Will continue with medical management. Would be at increased risk for complications regardless with invasive strategy. They are relieved that we are not pursuing an invasive approach. They understand that with or without cath he is at increased risk in general from CV perspective. They understand.   - May very well have graft that is down.   - IV heparin continue for at least another 24 hours, Plavix, metoprolol, Imdur, NTG PRN, pravastatin.  CAD  - post CABG  - prior cath reviewed as above  - no changes  Moderate aortic stenosis  - no change on recent echo, moderate, 4mmHg mean gradient  Dementia  - Confusion, Needed redirection from wife  PNA  - per  primary team. Recent Z pac. CXR reviewed.   DNR    For questions or updates, please contact [place] Please consult [email or web address] for contact info under Cardiology/STEMI.      Signed, Candee Furbish, MD  10/10/2016, 10:01 AM

## 2016-10-10 NOTE — Plan of Care (Signed)
Problem: Activity: Goal: Risk for activity intolerance will decrease Outcome: Progressing Patient currently on bedrest. Able to get to [facility] with assistance.

## 2016-10-10 NOTE — Progress Notes (Signed)
ANTICOAGULATION CONSULT NOTE  Pharmacy Consult for heparin  Indication: chest pain/ACS  No Known Allergies  Patient Measurements: Height: 5\' 6"  (167.6 cm) Weight: 177 lb (80.3 kg) IBW/kg (Calculated) : 63.8 Heparin Dosing Weight: 82.6 kg   Vital Signs: Temp: 98.7 F (37.1 C) (09/19 0425) Temp Source: Oral (09/19 0425) BP: 109/76 (09/19 0800) Pulse Rate: 98 (09/19 0425)  Labs:  Recent Labs  10/09/16 0240 10/09/16 0458 10/09/16 1156 10/09/16 1813 10/09/16 2109 10/10/16 0243  HGB 15.0 15.0  --   --   --  13.6  HCT 45.1 46.7  --   --   --  40.7  PLT 147* 131*  --   --   --  130*  HEPARINUNFRC  --   --  0.32  --  0.38 0.32  CREATININE 1.38*  --   --   --   --   --   TROPONINI 0.33* 3.63* 16.20* 34.18*  --   --    Estimated Creatinine Clearance: 34.7 mL/min (A) (by C-G formula based on SCr of 1.38 mg/dL (H)).  Assessment: 83 yo male admitted with chest pain. Pharmacy consulted to dose heparin for ACS. Troponin elevated 34.18. No oral anticoagulation PTA.CBC stable, HgB WNL, PLT 130  Heparin level at low end of therapeutic goal, will increase slightly to keep patient therapeutic. No bleeding or infusion issues documented.   Goal of Therapy:  Heparin level 0.3-0.7 units/ml Monitor platelets by anticoagulation protocol: Yes   Plan:  Increase heparin gtt to 1050 units/hr Daily heparin level and CBC Monitor for s/s bleeding   Georga Bora, PharmD Clinical Pharmacist 10/10/2016 8:43 AM

## 2016-10-10 NOTE — Progress Notes (Addendum)
PROGRESS NOTE  Andrew [name]  ZHY:[id number] DOB: [birth year] DOA: 10/09/2016 PCP: [name], MD  Brief Narrative:   Andrew [name] is a 83 y.o. male with medical history significant of CAD s/p CABG, moderate AS, diastolic CHF.  Patient presented to the ED with c/o chest pain and SOB.  Took 3 NTG at home and symptoms resolved.  Recently treated for pneumonia these past couple of weeks and had been feeling better.  Central chest pain and pressure with radiation across entire chest.  CXR suggested worsening PNA vs. edema, WBC 9.3, no fever in ED, trop 0.33.  CP free at this time after 3 SL NTG at home.   Assessment & Plan:   Principal Problem:   NSTEMI (non-ST elevated myocardial infarction) ([place]) Active Problems:   Chronic diastolic CHF (congestive heart failure) (HCC)   Essential hypertension   Coronary artery disease of native artery of native heart with stable angina pectoris (HCC)   CAP (community acquired pneumonia)  NSTEMI with isolated ST segment elevation in III in setting of known CAD s/p CABG -  Troponin 34 on 9/18 -  Cardiology discussed with patient and wife and elected medical management -  Heparin gtt started around 4AM on 9/18 -  Continue plavix, statin, imdur -   Metoprolol increased by cardiology to assist with tachycardia (HR up to the 120s on telemetry) -  Monitoring for arrhythmias and heart failure  Acute respiratory failure likely due to pulmonary edema from NSTEMI.  Recently completed antibiotics for the same and was feeling better.  Now with bilateral findings.  Alternatively, this could be aspiration in the setting of dementia that triggered an NSTEMI.  -  Check procalcitonin and if low, will stop antibiotics -  Check ECHO -  Not aggressively diuresing, but if procalcitonin low, evidence of new sHF on ECHO, will start IV lasix -  Will ask tomorrow about presence of aspiration-like symptoms when eating  Moderate aortic stenosis  - no change on recent  echo, moderate, 72mmHg mean gradient  Dementia  - pleasant currently. Needed redirection from wife  DVT prophylaxis:  Heparin gtt Code Status:  DNR Family Communication:  Patient and wife at bedside Disposition Plan:  Continue heparin and consider PT evaluation tomorrow    Consultants:   Cardiology  Procedures:  none  Antimicrobials:  Anti-infectives    Start     Dose/Rate Route Frequency Ordered Stop   10/10/16 0300  cefTRIAXone (ROCEPHIN) 1 g in dextrose 5 % 50 mL IVPB  Status:  Discontinued     1 g 100 mL/hr over 30 Minutes Intravenous Every 24 hours 10/09/16 0444 10/09/16 0444   10/10/16 0300  azithromycin (ZITHROMAX) 500 mg in dextrose 5 % 250 mL IVPB  Status:  Discontinued     500 mg 250 mL/hr over 60 Minutes Intravenous Every 24 hours 10/09/16 0444 10/09/16 0444   10/09/16 0600  cefTRIAXone (ROCEPHIN) 1 g in dextrose 5 % 50 mL IVPB     1 g 100 mL/hr over 30 Minutes Intravenous Daily 10/09/16 0444 10/16/16 0959   10/09/16 0600  azithromycin (ZITHROMAX) 500 mg in dextrose 5 % 250 mL IVPB     500 mg 250 mL/hr over 60 Minutes Intravenous Daily 10/09/16 0444 10/16/16 0959   10/09/16 0430  azithromycin (ZITHROMAX) 500 mg in dextrose 5 % 250 mL IVPB     500 mg 250 mL/hr over 60 Minutes Intravenous  Once 10/09/16 0422 10/09/16 0641   10/09/16 0430  cefTRIAXone (ROCEPHIN)  1 g in dextrose 5 % 50 mL IVPB     1 g 100 mL/hr over 30 Minutes Intravenous  Once 10/09/16 0422 10/09/16 1610       Subjective:  Had chest pains with some tachycardia this morning that was witnessed by cardiologist who was at bedside.  Symptoms now resolved.  Denies current chest pain, shortness of breath, nausea, lightheadedness.    Objective: Vitals:   10/10/16 0800 10/10/16 1031 10/10/16 1200 10/10/16 1600  BP: 109/76 131/73 112/63 (!) 113/59  Pulse:      Resp:   15 19  Temp:      TempSrc:      SpO2: 91% 93% 93% 94%  Weight:      Height:        Intake/Output Summary (Last 24 hours) at  10/10/16 1713 Last data filed at 10/10/16 1400  Gross per 24 hour  Intake              430 ml  Output              815 ml  Net             -385 ml   Filed Weights   10/09/16 0318 10/09/16 2106  Weight: 82.6 kg (182 lb) 80.3 kg (177 lb)    Examination:  General exam:  Adult male, initially standing at urinal, then sitting in bed.  No acute distress.  HEENT:  NCAT, MMM Respiratory system: faint rales at the bilateral bases Cardiovascular system: Regular rate and rhythm, normal S1/S2. No murmurs, rubs, gallops or clicks.  Warm extremities Gastrointestinal system: Normal active bowel sounds, soft, nondistended, nontender. MSK:  Normal tone and bulk, no lower extremity edema Neuro:  Grossly moves all extremities    Data Reviewed: I have personally reviewed following labs and imaging studies  CBC:  Recent Labs Lab 10/09/16 0240 10/09/16 0458 10/10/16 0243  WBC 9.3 8.9 8.6  NEUTROABS 6.7  --   --   HGB 15.0 15.0 13.6  HCT 45.1 46.7 40.7  MCV 100.0 100.9* 99.0  PLT 147* 131* 960*   Basic Metabolic Panel:  Recent Labs Lab 10/09/16 0240  NA 138  K 4.2  CL 107  CO2 21*  GLUCOSE 121*  BUN 28*  CREATININE 1.38*  CALCIUM 8.6*   GFR: Estimated Creatinine Clearance: 34.7 mL/min (A) (by C-G formula based on SCr of 1.38 mg/dL (H)). Liver Function Tests:  Recent Labs Lab 10/09/16 0240  AST 46*  ALT 25  ALKPHOS 89  BILITOT 1.2  PROT 6.3*  ALBUMIN 3.2*    Recent Labs Lab 10/09/16 0240  LIPASE 31   No results for input(s): AMMONIA in the last 168 hours. Coagulation Profile: No results for input(s): INR, PROTIME in the last 168 hours. Cardiac Enzymes:  Recent Labs Lab 10/09/16 0240 10/09/16 0458 10/09/16 1156 10/09/16 1813  TROPONINI 0.33* 3.63* 16.20* 34.18*   BNP (last 3 results) No results for input(s): PROBNP in the last 8760 hours. HbA1C: No results for input(s): HGBA1C in the last 72 hours. CBG: No results for input(s): GLUCAP in the last 168  hours. Lipid Profile: No results for input(s): CHOL, HDL, LDLCALC, TRIG, CHOLHDL, LDLDIRECT in the last 72 hours. Thyroid Function Tests: No results for input(s): TSH, T4TOTAL, FREET4, T3FREE, THYROIDAB in the last 72 hours. Anemia Panel: No results for input(s): VITAMINB12, FOLATE, FERRITIN, TIBC, IRON, RETICCTPCT in the last 72 hours. Urine analysis:    Component Value Date/Time   COLORURINE YELLOW 06/29/2015  [place] 06/29/2015 1451   LABSPEC 1.014 06/29/2015 1451   PHURINE 5.5 06/29/2015 1451   GLUCOSEU NEGATIVE 06/29/2015 1451   GLUCOSEU NEGATIVE 07/05/2009 0858   HGBUR NEGATIVE 06/29/2015 1451   BILIRUBINUR NEGATIVE 06/29/2015 1451   KETONESUR NEGATIVE 06/29/2015 1451   PROTEINUR NEGATIVE 06/29/2015 1451   UROBILINOGEN 0.2 07/05/2009 0858   NITRITE NEGATIVE 06/29/2015 1451   LEUKOCYTESUR NEGATIVE 06/29/2015 1451   Sepsis Labs: @LABRCNTIP (procalcitonin:4,lacticidven:4)  ) Recent Results (from the past 240 hour(s))  Blood culture (routine x 2)     Status: None (Preliminary result)   Collection Time: 10/09/16  5:02 AM  Result Value Ref Range Status   Specimen Description BLOOD RIGHT HAND  Final   Special Requests   Final    BOTTLES DRAWN AEROBIC ONLY Blood Culture adequate volume   Culture NO GROWTH 1 DAY  Final   Report Status PENDING  Incomplete  Blood culture (routine x 2)     Status: None (Preliminary result)   Collection Time: 10/09/16  5:40 AM  Result Value Ref Range Status   Specimen Description BLOOD LEFT ARM  Final   Special Requests IN PEDIATRIC BOTTLE Blood Culture adequate volume  Final   Culture NO GROWTH 1 DAY  Final   Report Status PENDING  Incomplete  MRSA PCR Screening     Status: None   Collection Time: 10/09/16 11:51 PM  Result Value Ref Range Status   MRSA by PCR NEGATIVE NEGATIVE Final    Comment:        The GeneXpert MRSA Assay (FDA approved for NASAL specimens only), is one component of a comprehensive MRSA  colonization surveillance program. It is not intended to diagnose MRSA infection nor to guide or monitor treatment for MRSA infections.       Radiology Studies: Dg Chest Portable 1 View  Result Date: 10/09/2016 CLINICAL DATA:  Chest pain for 1 day EXAM: PORTABLE CHEST 1 VIEW COMPARISON:  10/02/2016, 06/21/2015 FINDINGS: Post sternotomy changes. Mild cardiomegaly with aortic atherosclerosis. Increased right mid lung peripheral opacity suspect for a pneumonia. Increased bibasilar opacity. Possible tiny left effusion. No pneumothorax IMPRESSION: 1. Increased bilateral right greater than left pulmonary opacities suspect for worsening pneumonia. Radiographic follow-up to resolution recommended 2. Borderline cardiomegaly.  Possible tiny left effusion. Electronically Signed   By: Donavan Foil M.D.   On: 10/09/2016 03:34     Scheduled Meds: . cholecalciferol  1,000 Units Oral Daily  . clopidogrel  75 mg Oral Daily  . FLUoxetine  10 mg Oral Daily  . furosemide  40 mg Oral BID  . Influenza vac split quadrivalent PF  0.5 mL Intramuscular Tomorrow-1000  . isosorbide mononitrate  120 mg Oral Daily  . metoprolol succinate  25 mg Oral Daily  . multivitamin  1 tablet Oral QHS  . potassium chloride  20 mEq Oral Daily  . pravastatin  40 mg Oral Daily   Continuous Infusions: . azithromycin Stopped (10/10/16 1239)  . cefTRIAXone (ROCEPHIN)  IV Stopped (10/10/16 1046)  . heparin 1,050 Units/hr (10/10/16 1509)     LOS: 1 day    Time spent: 30 min    Janece Canterbury, MD [facility] Pager [phone]  If 7PM-7AM, please contact night-coverage [email or web address] Password [email or web address] 10/10/2016, 5:13 PM

## 2016-10-11 ENCOUNTER — Inpatient Hospital Stay (HOSPITAL_COMMUNITY): Payer: Medicare Other

## 2016-10-11 DIAGNOSIS — I352 Nonrheumatic aortic (valve) stenosis with insufficiency: Secondary | ICD-10-CM

## 2016-10-11 LAB — ECHOCARDIOGRAM COMPLETE
AO mean calculated velocity dopler: 199 cm/s
AOASC: 40 cm
AOVTI: 59.4 cm
AV Area VTI index: 0.77 cm2/m2
AV Area VTI: 1.67 cm2
AV Area mean vel: 1.43 cm2
AV Peak grad: 30 mmHg
AV VEL mean LVOT/AV: 0.41
AV area mean vel ind: 0.73 cm2/m2
AV pk vel: 274 cm/s
AV vel: 1.51
AVG: 18 mmHg
AVPHT: 374 ms
Ao pk vel: 0.48 m/s
CHL CUP AV PEAK INDEX: 0.85
CHL CUP AV VALUE AREA INDEX: 0.77
CHL CUP RV SYS PRESS: 65 mmHg
E decel time: 183 msec
E/e' ratio: 14.23
FS: 18 % — AB (ref 28–44)
Height: 66 in
IV/PV OW: 1.21
LA diam end sys: 49 mm
LA vol index: 36.8 mL/m2
LA vol: 71.9 mL
LADIAMINDEX: 2.51 cm/m2
LASIZE: 49 mm
LAVOLA4C: 75.7 mL
LDCA: 3.46 cm2
LV PW d: 11.5 mm — AB (ref 0.6–1.1)
LV TDI E'MEDIAL: 3.75
LVEEAVG: 14.23
LVEEMED: 14.23
LVELAT: 7.8 cm/s
LVOT VTI: 25.9 cm
LVOT diameter: 21 mm
LVOT peak grad rest: 7 mmHg
LVOTPV: 132 cm/s
LVOTSV: 90 mL
LVOTVTI: 0.44 cm
MV Dec: 183
MV Peak grad: 5 mmHg
MVPKAVEL: 74.7 m/s
MVPKEVEL: 111 m/s
PV Reg grad dias: 6 mmHg
PV Reg vel dias: 125 cm/s
RV LATERAL S' VELOCITY: 8.63 cm/s
Reg peak vel: 354 cm/s
TAPSE: 13.2 mm
TDI e' lateral: 7.8
TR max vel: 354 cm/s
Valve area: 1.51 cm2
Weight: 2832 oz

## 2016-10-11 LAB — BASIC METABOLIC PANEL
ANION GAP: 8 (ref 5–15)
BUN: 24 mg/dL — ABNORMAL HIGH (ref 6–20)
CALCIUM: 8.5 mg/dL — AB (ref 8.9–10.3)
CO2: 25 mmol/L (ref 22–32)
Chloride: 104 mmol/L (ref 101–111)
Creatinine, Ser: 1.36 mg/dL — ABNORMAL HIGH (ref 0.61–1.24)
GFR, EST AFRICAN AMERICAN: 51 mL/min — AB (ref >60)
GFR, EST NON AFRICAN AMERICAN: 44 mL/min — AB (ref >60)
Glucose, Bld: 104 mg/dL — ABNORMAL HIGH (ref 65–99)
POTASSIUM: 3.9 mmol/L (ref 3.5–5.1)
Sodium: 137 mmol/L (ref 135–145)

## 2016-10-11 LAB — CBC
HEMATOCRIT: 41.5 % (ref 39.0–52.0)
Hemoglobin: 13.9 g/dL (ref 13.0–17.0)
MCH: 33.3 pg (ref 26.0–34.0)
MCHC: 33.5 g/dL (ref 30.0–36.0)
MCV: 99.5 fL (ref 78.0–100.0)
Platelets: 141 10*3/uL — ABNORMAL LOW (ref 150–400)
RBC: 4.17 MIL/uL — ABNORMAL LOW (ref 4.22–5.81)
RDW: 15 % (ref 11.5–15.5)
WBC: 9.1 10*3/uL (ref 4.0–10.5)

## 2016-10-11 LAB — HEPARIN LEVEL (UNFRACTIONATED)
HEPARIN UNFRACTIONATED: 0.29 [IU]/mL — AB (ref 0.30–0.70)
HEPARIN UNFRACTIONATED: 0.38 [IU]/mL (ref 0.30–0.70)

## 2016-10-11 LAB — MAGNESIUM: MAGNESIUM: 2 mg/dL (ref 1.7–2.4)

## 2016-10-11 MED ORDER — FUROSEMIDE 10 MG/ML IJ SOLN
40.0000 mg | Freq: Two times a day (BID) | INTRAMUSCULAR | Status: DC
  Administered 2016-10-11 – 2016-10-12 (×3): 40 mg via INTRAVENOUS
  Filled 2016-10-11 (×3): qty 4

## 2016-10-11 NOTE — Progress Notes (Signed)
ANTICOAGULATION CONSULT NOTE  Pharmacy Consult for heparin  Indication: chest pain/ACS  No Known Allergies  Patient Measurements: Height: 5\' 6"  (167.6 cm) Weight: 177 lb (80.3 kg) IBW/kg (Calculated) : 63.8 Heparin Dosing Weight: 82.6 kg   Vital Signs: Temp: 98.6 F (37 C) (09/20 0000) Temp Source: Oral (09/20 0000) BP: 128/72 (09/20 0000) Pulse Rate: 70 (09/20 0000)  Labs:  Recent Labs  10/09/16 0240 10/09/16 0458  10/09/16 1156 10/09/16 1813 10/09/16 2109 10/10/16 0243 10/11/16 0350  HGB 15.0 15.0  --   --   --   --  13.6 13.9  HCT 45.1 46.7  --   --   --   --  40.7 41.5  PLT 147* 131*  --   --   --   --  130* 141*  HEPARINUNFRC  --   --   < > 0.32  --  0.38 0.32 0.29*  CREATININE 1.38*  --   --   --   --   --   --  1.36*  TROPONINI 0.33* 3.63*  --  16.20* 34.18*  --   --   --   < > = values in this interval not displayed. Estimated Creatinine Clearance: 35.2 mL/min (A) (by C-G formula based on SCr of 1.36 mg/dL (H)).  Assessment: 83 yo male admitted with chest pain. Pharmacy consulted to dose heparin for ACS. Troponin 34.18.   Heparin level subtherapeutic at 0.29. CBC stable and no s/s bleeding noted.   Goal of Therapy:  Heparin level 0.3-0.7 units/ml Monitor platelets by anticoagulation protocol: Yes   Plan:  Increase heparin gtt to 1150 units/hr Heparin level in 8 hrs Daily heparin level and CBC Monitor for s/s bleeding   Argie Ramming, PharmD Clinical Pharmacist 10/11/16 5:23 AM

## 2016-10-11 NOTE — Plan of Care (Signed)
Problem: Pain Managment: Goal: General experience of comfort will improve Outcome: Progressing Denies pain at this time   

## 2016-10-11 NOTE — Progress Notes (Signed)
ANTICOAGULATION CONSULT NOTE - Follow Up Consult  Pharmacy Consult for Heparin Indication: chest pain/ACS  No Known Allergies  Patient Measurements: Height: 5\' 6"  (167.6 cm) Weight: 177 lb (80.3 kg) IBW/kg (Calculated) : 63.8 Heparin Dosing Weight:  79.9 kg  Vital Signs: Temp: 99.5 F (37.5 C) (09/20 1200) Temp Source: Oral (09/20 1200) BP: 114/63 (09/20 1200) Pulse Rate: 78 (09/20 1200)  Labs:  Recent Labs  10/09/16 0240 10/09/16 0458 10/09/16 1156 10/09/16 1813  10/10/16 0243 10/11/16 0350 10/11/16 1655  HGB 15.0 15.0  --   --   --  13.6 13.9  --   HCT 45.1 46.7  --   --   --  40.7 41.5  --   PLT 147* 131*  --   --   --  130* 141*  --   HEPARINUNFRC  --   --  0.32  --   < > 0.32 0.29* 0.38  CREATININE 1.38*  --   --   --   --   --  1.36*  --   TROPONINI 0.33* 3.63* 16.20* 34.18*  --   --   --   --   < > = values in this interval not displayed.  Estimated Creatinine Clearance: 35.2 mL/min (A) (by C-G formula based on SCr of 1.36 mg/dL (H)).   Assessment:  Anticoag: heparin for acs. No oral anticoagulation PTA per RN report. D-dimer 1.72 HL 0.38 now in goal range.  Goal of Therapy:  Heparin level 0.3-0.7 units/ml Monitor platelets by anticoagulation protocol: Yes   Plan:  Continue heparin drip at 1150 units/hr Daily heparin level and CBC   Maikol Grassia S. Alford Highland, PharmD, BCPS Clinical Staff Pharmacist Pager [phone]  Eilene Ghazi Stillinger 10/11/2016,5:55 PM

## 2016-10-11 NOTE — Evaluation (Signed)
Physical Therapy Evaluation Patient Details Name: Andrew [name] MRN: [id number] DOB: [birth year] Today's Date: 10/11/2016   History of Present Illness  Andrew [name] is a 83 y.o. male with medical history significant of CAD s/p CABG, moderate AS, diastolic CHF.  Patient presented to the ED with c/o chest pain and SOB. Dx with NSTEMI, acute on chronic CHF, CAP, and hypertension.    Clinical Impression  Pt admitted with above diagnosis. Pt currently with functional limitations due to the deficits listed below (see PT Problem List). Pt was limited in mobility by decrease in O2 saturation and increase in HR with activity. Pt is currently, supervision for bed mobility, min guard for transfers and minA for ambulation of 75 feet with RW due to 1x minor LoB that occurred with O2 desaturation. Oxygen saturation rebounded with vc for pursed lipped breathing and ambulation safely continued. Once cardiopulmonary issues are resolved PT feels that pt would be able to discharge home with HHPT.  Pt will benefit from skilled PT to increase their independence and safety with mobility in the acute setting.    Follow Up Recommendations Home health PT;Supervision/Assistance - 24 hour    Equipment Recommendations  None recommended by PT    Recommendations for Other Services       Precautions / Restrictions Precautions Precautions: None Restrictions Weight Bearing Restrictions: No      Mobility  Bed Mobility Overal bed mobility: Needs Assistance Bed Mobility: Supine to Sit     Supine to sit: Supervision     General bed mobility comments: supervision for safety, vc for hand placement, scooting hips to EoB  Transfers Overall transfer level: Needs assistance Equipment used: Rolling walker (2 wheeled) Transfers: Sit to/from Stand Sit to Stand: Min guard         General transfer comment: min guard for safety, vc for hand placement on bed surface, anterior pelvic tilt to come all the way to  standing  Ambulation/Gait Ambulation/Gait assistance: Min assist Ambulation Distance (Feet): 75 Feet Assistive device: Rolling walker (2 wheeled) Gait Pattern/deviations: Step-through pattern;Decreased step length - right;Decreased step length - left;Leaning posteriorly Gait velocity: slowed Gait velocity interpretation: Below normal speed for age/gender General Gait Details: minA for steadying for 1x LOB, vc for forward lean and bringing toes of feet all the way to the ground, before advancing next step   Stairs            Wheelchair Mobility    Modified Rankin (Stroke Patients Only)       Balance Overall balance assessment: Needs assistance Sitting-balance support: No upper extremity supported;Feet supported Sitting balance-Leahy Scale: Fair Sitting balance - Comments: able to sit EoB without challeng   Standing balance support: Bilateral upper extremity supported Standing balance-Leahy Scale: Poor Standing balance comment: requires UE support on RW for static balance                             Pertinent Vitals/Pain Pain Assessment: Faces Faces Pain Scale: Hurts a little bit Pain Location: back with movement Pain Descriptors / Indicators: Aching;Sore Pain Intervention(s): Monitored during session;Limited activity within patient's tolerance    Home Living Family/patient expects to be discharged to:: Other (Comment) (independent living) Living Arrangements: Spouse/significant other Available Help at Discharge: Family;Available 24 hours/day Type of Home: Independent living facility Home Access: Level entry     Home Layout: One level Home Equipment: Walker - 2 wheels;Cane - single point;Wheelchair - manual  Prior Function Level of Independence: Needs assistance   Gait / Transfers Assistance Needed: walker/cane in home, wheelchair to dining room  ADL's / Homemaking Assistance Needed: independent in ADLs, needs some assist with iADLs         Hand Dominance   Dominant Hand: Right    Extremity/Trunk Assessment   Upper Extremity Assessment Upper Extremity Assessment: Generalized weakness    Lower Extremity Assessment Lower Extremity Assessment: Generalized weakness       Communication   Communication: No difficulties  Cognition Arousal/Alertness: Awake/alert Behavior During Therapy: WFL for tasks assessed/performed Overall Cognitive Status: Impaired/Different from baseline Area of Impairment: Orientation;Memory;Safety/judgement;Following commands;Attention;Problem solving;Awareness                 Orientation Level: Place;Disoriented to Current Attention Level: Divided Memory: Decreased short-term memory Following Commands: Follows multi-step commands consistently     Problem Solving: Requires verbal cues;Requires tactile cues        General Comments General comments (skin integrity, edema, etc.): Prior to activity BP 138/63, SaO2 on RA 98%O2, HR 78bpm, with ambulation pt SaO2 dropped to 86%O2, HR max 158 bpm, after activity sitting in recliner HR 68 bpm with no additional activity HR increased to 148 bpm and then dropped back to 85 bpm, after ambulation BP 131/65, SaO2 on RA 92%O2, HR 85bpm         Assessment/Plan    PT Assessment Patient needs continued PT services  PT Problem List Decreased strength;Decreased activity tolerance;Decreased balance;Decreased mobility;Decreased cognition;Decreased safety awareness;Cardiopulmonary status limiting activity       PT Treatment Interventions DME instruction;Gait training;Functional mobility training;Therapeutic activities;Therapeutic exercise;Balance training;Cognitive remediation;Patient/family education    PT Goals (Current goals can be found in the Care Plan section)  Acute Rehab PT Goals Patient Stated Goal: go home PT Goal Formulation: With patient/family Time For Goal Achievement: 10/25/16 Potential to Achieve Goals: Good    Frequency Min  3X/week    AM-PAC PT "6 Clicks" Daily Activity  Outcome Measure Difficulty turning over in bed (including adjusting bedclothes, sheets and blankets)?: A Lot Difficulty moving from lying on back to sitting on the side of the bed? : A Lot Difficulty sitting down on and standing up from a chair with arms (e.g., wheelchair, bedside commode, etc,.)?: A Lot Help needed moving to and from a bed to chair (including a wheelchair)?: A Little Help needed walking in hospital room?: A Little Help needed climbing 3-5 steps with a railing? : A Lot 6 Click Score: 14    End of Session Equipment Utilized During Treatment: Gait belt Activity Tolerance: Patient limited by fatigue Patient left: in chair;with call bell/phone within reach;with chair alarm set;with family/visitor present Nurse Communication: Mobility status PT Visit Diagnosis: Other abnormalities of gait and mobility (R26.89);Muscle weakness (generalized) (M62.81);Difficulty in walking, not elsewhere classified (R26.2);Unsteadiness on feet (R26.81)    Time: 9528-4132 PT Time Calculation (min) (ACUTE ONLY): 30 min   Charges:   PT Evaluation $PT Eval Moderate Complexity: 1 Mod PT Treatments $Gait Training: 8-22 mins   PT G Codes:        Claudie Brickhouse B. Migdalia Dk PT, DPT Acute Rehabilitation  [phone] Pager [phone]    [place] 10/11/2016, 4:48 PM

## 2016-10-11 NOTE — Progress Notes (Signed)
Progress Note  Patient Name: Andrew [name] Date of Encounter: 10/11/2016  Primary Cardiologist: End  Subjective   Feeling better, no chest discomfort. Yesterday morning had episode of atrial tachycardia, metoprolol IV health resolved. No syncope, no significant shortness of breath.  Inpatient Medications    Scheduled Meds: . cholecalciferol  1,000 Units Oral Daily  . clopidogrel  75 mg Oral Daily  . FLUoxetine  10 mg Oral Daily  . furosemide  40 mg Intravenous BID  . Influenza vac split quadrivalent PF  0.5 mL Intramuscular Tomorrow-1000  . isosorbide mononitrate  120 mg Oral Daily  . metoprolol succinate  25 mg Oral Daily  . multivitamin  1 tablet Oral QHS  . potassium chloride  20 mEq Oral Daily  . pravastatin  40 mg Oral Daily   Continuous Infusions: . heparin 1,150 Units/hr (10/11/16 0616)   PRN Meds: albuterol, metoprolol tartrate, nitroGLYCERIN   Vital Signs    Vitals:   10/10/16 2300 10/11/16 0000 10/11/16 0100 10/11/16 0832  BP:  128/72  118/80  Pulse:  70  77  Resp: (!) 22 (!) 23 (!) 21 18  Temp:  98.6 F (37 C)  99.2 F (37.3 C)  TempSrc:  Oral  Oral  SpO2: 94% 90% 94% 93%  Weight:      Height:        Intake/Output Summary (Last 24 hours) at 10/11/16 0858 Last data filed at 10/11/16 0831  Gross per 24 hour  Intake           890.96 ml  Output             2525 ml  Net         -1634.04 ml   Filed Weights   10/09/16 0318 10/09/16 2106  Weight: 182 lb (82.6 kg) 177 lb (80.3 kg)    Telemetry    Atrial tachyHas resolved and currently in sinus rhythm with occasional PACs, intraventricular conduction delay noted. Rare couplets. - Personally Reviewed  ECG    NSR with nonspecific IVCD, LAD, LVH - Personally Reviewed  Physical Exam  GEN: Well nourished, well developed, in no acute distress elderly HEENT: normal  Neck: no JVD, carotid bruits, or masses Cardiac: RRR; no murmurs, rubs, or gallops,no edema  Respiratory:  clear to auscultation  bilaterally, normal work of breathing GI: soft, nontender, nondistended, + BS MS: no deformity or atrophy  Skin: warm and dry, no rash Neuro:  Alert but has to be redirected at times  Psych: euthymic mood, full affect   Labs    Chemistry  Recent Labs Lab 10/09/16 0240 10/11/16 0350  NA 138 137  K 4.2 3.9  CL 107 104  CO2 21* 25  GLUCOSE 121* 104*  BUN 28* 24*  CREATININE 1.38* 1.36*  CALCIUM 8.6* 8.5*  PROT 6.3*  --   ALBUMIN 3.2*  --   AST 46*  --   ALT 25  --   ALKPHOS 89  --   BILITOT 1.2  --   GFRNONAA 43* 44*  GFRAA 50* 51*  ANIONGAP 10 8     Hematology  Recent Labs Lab 10/09/16 0458 10/10/16 0243 10/11/16 0350  WBC 8.9 8.6 9.1  RBC 4.63 4.11* 4.17*  HGB 15.0 13.6 13.9  HCT 46.7 40.7 41.5  MCV 100.9* 99.0 99.5  MCH 32.4 33.1 33.3  MCHC 32.1 33.4 33.5  RDW 15.2 15.0 15.0  PLT 131* 130* 141*    Cardiac Enzymes  Recent Labs Lab 10/09/16 0240  10/09/16 0458 10/09/16 1156 10/09/16 1813  TROPONINI 0.33* 3.63* 16.20* 34.18*     Recent Labs Lab 10/09/16 0242  TROPIPOC 0.53*     BNP  Recent Labs Lab 10/09/16 0458  BNP 478.7*     DDimer   Recent Labs Lab 10/09/16 0458  DDIMER 1.72*     Radiology    No results found.  Cardiac Studies    Ischemic cardiomyopathy  Cath/PCI:  LHC (2004): One limb of sequential SVG to OM1, OM2, and OM3 occluded.  CV Surgery:  CABG (1991): LIMA to LAD, SVG to PDA, sequential SVG to OM1, OM2, and OM3.  EP Procedures and Devices:  None  Non-Invasive Evaluation(s):  TTE (06/21/16): Mildly dilated left ventricle with mild LVH. LVEF 55-60%. Thickened aortic valve with moderately restricted motion. Mean gradient 22 mmHg. Mild aortic regurgitation and mitral regurgitation. Mild left atrial enlargement. Moderate pulmonic regurgitation. Mild pulmonary hypertension. No significant change from prior echo in 2017.  Carotid artery Doppler (05/28/16): 60-79% left and less than 40% right internal  carotid artery stenoses.  His last nuc stress test was from 2014: In 12/14, he had a Lexiscan Cardiolite which showed EF 42% with medium-sized scar in the lateral wall along with mild peri-infarct ischemia. This was deemed a low risk study.   Patient Profile     83 y.o. male with NSTEMI, prior CABG, moderate AS, mild dementia, DNR  Assessment & Plan    Atrial tachycardia  - EKG shortly after episode showed sinus rhythm with PACs. Responded well to the metoprolol.  - Resolved. His daughter did have insight on this stain that usually he gets very anxious if he has to go to the bathroom and his wife also adds fuel to the fire so to speak. He is worried that he is going to "have an accident "and during those times he ends up having a panic attack. This may very well have been what we witnessed yesterday morning as he was sitting on the edge of the bed trying to transfer to the commode with tachycardia and chest pain.  NSTEMI  - Trop 0.3>3>30>34  - Long discussion with wife and patient and today with his daughter. Continued medical management, noninvasive. We have previously discussed options such as cardiac catheterization and they are relieved that they are continuing with medical management.  - May very well have graft that is down.   - IV heparin continue again for another 24 hours, Plavix, metoprolol, Imdur, NTG PRN, pravastatin.  - Understands morbidity/mortality risks.  - Toprol 25 mg once a day. May need to increase if tachycardia once again returns.  - I'm comfortable with him ambulating in his room/hallway with assistance.  CAD  - post CABG  - prior cath reviewed as above  - no changes  Moderate aortic stenosis  - no change on recent echo, moderate, 68mmHg mean gradient  Dementia  - Confusion, Needed redirection from wife  PNA  - per primary team. Recent Z pac. CXR reviewed. Reviewed Dr. Rise Mu note, consideration of aspiration possibly given his underlying dementia.  -  Lasix has been ordered for possible pulmonary edema on chest x-ray. Creatinine is stable currently.  DNR    For questions or updates, please contact [place] Please consult [email or web address] for contact info under Cardiology/STEMI.      Signed, Candee Furbish, MD  10/11/2016, 8:58 AM

## 2016-10-11 NOTE — Progress Notes (Signed)
PT Cancellation Note  Patient Details Name: Andrew [name] MRN: [id number] DOB: 1924/01/07   Cancelled Treatment:    Reason Eval/Treat Not Completed: Patient at procedure or test/unavailable Pt having Ecco done. PT will check back as able.   Eugine Bubb B. Migdalia Dk PT, DPT Acute Rehabilitation  [phone] Pager [phone]     [place] 10/11/2016, 10:25 AM

## 2016-10-11 NOTE — Care Management Note (Signed)
Case Management Note Marvetta Gibbons RN, BSN Unit 4E-Case Manager [phone]  Patient Details  Name: FITZROY MIKAMI MRN: [id number] Date of Birth: 05/22/1923  Subjective/Objective:    Pt admitted with NSTEMI                Action/Plan: PTA pt lived at home with wife- lives at [place] at [employer] -- PT pending- CM to follow  Expected Discharge Date:                  Expected Discharge Plan:  Home/Self Care  In-House Referral:  Clinical Social Work  Discharge planning Services  CM Consult  Post Acute Care Choice:    Choice offered to:     DME Arranged:    DME Agency:     HH Arranged:    [place] Agency:     Status of Service:  In process, will continue to follow  If discussed at Long Length of Stay Meetings, dates discussed:    Discharge Disposition:   Additional Comments:  Dawayne Patricia, RN 10/11/2016, 3:36 PM

## 2016-10-11 NOTE — Progress Notes (Signed)
  Echocardiogram 2D Echocardiogram has been performed.  Olimpia Tinch G Lizzett Nobile 10/11/2016, 11:41 AM

## 2016-10-11 NOTE — Progress Notes (Signed)
PROGRESS NOTE  Andrew [name]  OZH:[id number] DOB: Jan 12, 1924 DOA: 10/09/2016 PCP: [name], MD  Brief Narrative:   Andrew [name] is a 83 y.o. male with medical history significant of CAD s/p CABG, moderate AS, diastolic CHF.  Patient presented to the ED with c/o chest pain and SOB.  Took 3 NTG at home and symptoms resolved.  Recently treated for pneumonia these past couple of weeks and had been feeling better.  Central chest pain and pressure with radiation across entire chest.  CXR suggested worsening PNA vs. edema, WBC 9.3, no fever in ED, trop 0.33.  CP free at this time after 3 SL NTG at home.  Troponin rose to 34, now with worsening rales on exam.     Assessment & Plan:   Principal Problem:   NSTEMI (non-ST elevated myocardial infarction) ([place]) Active Problems:   Chronic diastolic CHF (congestive heart failure) (HCC)   Essential hypertension   Coronary artery disease of native artery of native heart with stable angina pectoris (HCC)   CAP (community acquired pneumonia)  NSTEMI with isolated ST segment elevation in III in setting of known CAD s/p CABG -  Troponin 34 on 9/18 -  Patient and wife and elected medical management -  Heparin gtt started around 4AM on 9/18, anticipate will be discontinued tomorrow morning at 72 hours -  Continue plavix, statin, imdur -   Metoprolol and tachycardia being addressed by cardiology -  Appreciate cardiology recommendations  Acute respiratory failure likely due to pulmonary edema from NSTEMI.  Recently completed antibiotics for the same and was feeling better.  Now with bilateral findings.  Doubt aspiration as patient has not had any choking/coughing problems prior to admission, but given age and dementia, still a possibility.  Will attempt some diuresis -  Stop antibiotics -  SLP evaluation  -  Check ECHO -  Start IV lasix 40mg  IV BID  Chronic kidney disease stage III, creatinine at baseline of 1.3  Moderate aortic stenosis  - no  change on recent echo, moderate, 45mmHg mean gradient  Dementia  - pleasant currently  DVT prophylaxis:  Heparin gtt Code Status:  DNR Family Communication:  Patient and daughter at bedside Disposition Plan:  Continue heparin.  PT evaluation ordered   Consultants:   Cardiology  Procedures:  none  Antimicrobials:  Anti-infectives    Start     Dose/Rate Route Frequency Ordered Stop   10/10/16 0300  cefTRIAXone (ROCEPHIN) 1 g in dextrose 5 % 50 mL IVPB  Status:  Discontinued     1 g 100 mL/hr over 30 Minutes Intravenous Every 24 hours 10/09/16 0444 10/09/16 0444   10/10/16 0300  azithromycin (ZITHROMAX) 500 mg in dextrose 5 % 250 mL IVPB  Status:  Discontinued     500 mg 250 mL/hr over 60 Minutes Intravenous Every 24 hours 10/09/16 0444 10/09/16 0444   10/09/16 0600  cefTRIAXone (ROCEPHIN) 1 g in dextrose 5 % 50 mL IVPB  Status:  Discontinued     1 g 100 mL/hr over 30 Minutes Intravenous Daily 10/09/16 0444 10/11/16 0839   10/09/16 0600  azithromycin (ZITHROMAX) 500 mg in dextrose 5 % 250 mL IVPB  Status:  Discontinued     500 mg 250 mL/hr over 60 Minutes Intravenous Daily 10/09/16 0444 10/11/16 0839   10/09/16 0430  azithromycin (ZITHROMAX) 500 mg in dextrose 5 % 250 mL IVPB     500 mg 250 mL/hr over 60 Minutes Intravenous  Once 10/09/16 0422 10/09/16  1517   10/09/16 0430  cefTRIAXone (ROCEPHIN) 1 g in dextrose 5 % 50 mL IVPB     1 g 100 mL/hr over 30 Minutes Intravenous  Once 10/09/16 0422 10/09/16 6160       Subjective:  Denies chest pains but has considerable dyspnea with minimal exertion. Denies nausea, vomiting, lightheadedness, lower extremity swelling. Has been coughing a little.  Objective: Vitals:   10/10/16 2300 10/11/16 0000 10/11/16 0100 10/11/16 0832  BP:  128/72  118/80  Pulse:  70  77  Resp: (!) 22 (!) 23 (!) 21 18  Temp:  98.6 F (37 C)  99.2 F (37.3 C)  TempSrc:  Oral  Oral  SpO2: 94% 90% 94% 93%  Weight:      Height:        Intake/Output  Summary (Last 24 hours) at 10/11/16 1232 Last data filed at 10/11/16 0957  Gross per 24 hour  Intake           720.96 ml  Output             2425 ml  Net         -1704.04 ml   Filed Weights   10/09/16 0318 10/09/16 2106  Weight: 82.6 kg (182 lb) 80.3 kg (177 lb)    Examination:  General exam:  Adult Male.  No acute distress.  HEENT:  NCAT, MMM Respiratory system: Rales at the bilateral bases, increased compared to yesterday, no wheezes or rhonchi Cardiovascular system: Regular rate and rhythm, possible gallop today.  Warm extremities Gastrointestinal system: Normal active bowel sounds, soft, nondistended, nontender. MSK:  Normal tone and bulk, no lower extremity edema Neuro:  Grossly intact  Data Reviewed: I have personally reviewed following labs and imaging studies  CBC:  Recent Labs Lab 10/09/16 0240 10/09/16 0458 10/10/16 0243 10/11/16 0350  WBC 9.3 8.9 8.6 9.1  NEUTROABS 6.7  --   --   --   HGB 15.0 15.0 13.6 13.9  HCT 45.1 46.7 40.7 41.5  MCV 100.0 100.9* 99.0 99.5  PLT 147* 131* 130* 737*   Basic Metabolic Panel:  Recent Labs Lab 10/09/16 0240 10/11/16 0350  NA 138 137  K 4.2 3.9  CL 107 104  CO2 21* 25  GLUCOSE 121* 104*  BUN 28* 24*  CREATININE 1.38* 1.36*  CALCIUM 8.6* 8.5*  MG  --  2.0   GFR: Estimated Creatinine Clearance: 35.2 mL/min (A) (by C-G formula based on SCr of 1.36 mg/dL (H)). Liver Function Tests:  Recent Labs Lab 10/09/16 0240  AST 46*  ALT 25  ALKPHOS 89  BILITOT 1.2  PROT 6.3*  ALBUMIN 3.2*    Recent Labs Lab 10/09/16 0240  LIPASE 31   No results for input(s): AMMONIA in the last 168 hours. Coagulation Profile: No results for input(s): INR, PROTIME in the last 168 hours. Cardiac Enzymes:  Recent Labs Lab 10/09/16 0240 10/09/16 0458 10/09/16 1156 10/09/16 1813  TROPONINI 0.33* 3.63* 16.20* 34.18*   BNP (last 3 results) No results for input(s): PROBNP in the last 8760 hours. HbA1C: No results for  input(s): HGBA1C in the last 72 hours. CBG: No results for input(s): GLUCAP in the last 168 hours. Lipid Profile: No results for input(s): CHOL, HDL, LDLCALC, TRIG, CHOLHDL, LDLDIRECT in the last 72 hours. Thyroid Function Tests: No results for input(s): TSH, T4TOTAL, FREET4, T3FREE, THYROIDAB in the last 72 hours. Anemia Panel: No results for input(s): VITAMINB12, FOLATE, FERRITIN, TIBC, IRON, RETICCTPCT in the last 72 hours.  Urine analysis:    Component Value Date/Time   COLORURINE YELLOW 06/29/2015 [place] 06/29/2015 1451   LABSPEC 1.014 06/29/2015 1451   PHURINE 5.5 06/29/2015 1451   GLUCOSEU NEGATIVE 06/29/2015 1451   GLUCOSEU NEGATIVE 07/05/2009 0858   HGBUR NEGATIVE 06/29/2015 1451   BILIRUBINUR NEGATIVE 06/29/2015 1451   KETONESUR NEGATIVE 06/29/2015 1451   PROTEINUR NEGATIVE 06/29/2015 1451   UROBILINOGEN 0.2 07/05/2009 0858   NITRITE NEGATIVE 06/29/2015 1451   LEUKOCYTESUR NEGATIVE 06/29/2015 1451   Sepsis Labs: @LABRCNTIP (procalcitonin:4,lacticidven:4)  ) Recent Results (from the past 240 hour(s))  Blood culture (routine x 2)     Status: None (Preliminary result)   Collection Time: 10/09/16  5:02 AM  Result Value Ref Range Status   Specimen Description BLOOD RIGHT HAND  Final   Special Requests   Final    BOTTLES DRAWN AEROBIC ONLY Blood Culture adequate volume   Culture NO GROWTH 1 DAY  Final   Report Status PENDING  Incomplete  Blood culture (routine x 2)     Status: None (Preliminary result)   Collection Time: 10/09/16  5:40 AM  Result Value Ref Range Status   Specimen Description BLOOD LEFT ARM  Final   Special Requests IN PEDIATRIC BOTTLE Blood Culture adequate volume  Final   Culture NO GROWTH 1 DAY  Final   Report Status PENDING  Incomplete  MRSA PCR Screening     Status: None   Collection Time: 10/09/16 11:51 PM  Result Value Ref Range Status   MRSA by PCR NEGATIVE NEGATIVE Final    Comment:        The GeneXpert MRSA Assay  (FDA approved for NASAL specimens only), is one component of a comprehensive MRSA colonization surveillance program. It is not intended to diagnose MRSA infection nor to guide or monitor treatment for MRSA infections.       Radiology Studies: No results found.   Scheduled Meds: . cholecalciferol  1,000 Units Oral Daily  . clopidogrel  75 mg Oral Daily  . FLUoxetine  10 mg Oral Daily  . furosemide  40 mg Intravenous BID  . Influenza vac split quadrivalent PF  0.5 mL Intramuscular Tomorrow-1000  . isosorbide mononitrate  120 mg Oral Daily  . metoprolol succinate  25 mg Oral Daily  . multivitamin  1 tablet Oral QHS  . potassium chloride  20 mEq Oral Daily  . pravastatin  40 mg Oral Daily   Continuous Infusions: . heparin 1,150 Units/hr (10/11/16 0616)     LOS: 2 days    Time spent: 30 min    Janece Canterbury, MD [facility] Pager [phone]  If 7PM-7AM, please contact night-coverage [email or web address] Password [facility] 10/11/2016, 12:32 PM

## 2016-10-12 ENCOUNTER — Other Ambulatory Visit (HOSPITAL_COMMUNITY): Payer: Medicare Other

## 2016-10-12 ENCOUNTER — Inpatient Hospital Stay (HOSPITAL_COMMUNITY): Payer: Medicare Other

## 2016-10-12 LAB — CBC
HEMATOCRIT: 40.8 % (ref 39.0–52.0)
Hemoglobin: 13.8 g/dL (ref 13.0–17.0)
MCH: 33.3 pg (ref 26.0–34.0)
MCHC: 33.8 g/dL (ref 30.0–36.0)
MCV: 98.3 fL (ref 78.0–100.0)
PLATELETS: 130 10*3/uL — AB (ref 150–400)
RBC: 4.15 MIL/uL — AB (ref 4.22–5.81)
RDW: 14.9 % (ref 11.5–15.5)
WBC: 9 10*3/uL (ref 4.0–10.5)

## 2016-10-12 LAB — BASIC METABOLIC PANEL
ANION GAP: 8 (ref 5–15)
BUN: 30 mg/dL — ABNORMAL HIGH (ref 6–20)
CALCIUM: 8.6 mg/dL — AB (ref 8.9–10.3)
CO2: 25 mmol/L (ref 22–32)
Chloride: 104 mmol/L (ref 101–111)
Creatinine, Ser: 1.48 mg/dL — ABNORMAL HIGH (ref 0.61–1.24)
GFR calc Af Amer: 46 mL/min — ABNORMAL LOW (ref >60)
GFR calc non Af Amer: 40 mL/min — ABNORMAL LOW (ref >60)
GLUCOSE: 103 mg/dL — AB (ref 65–99)
POTASSIUM: 3.3 mmol/L — AB (ref 3.5–5.1)
SODIUM: 137 mmol/L (ref 135–145)

## 2016-10-12 LAB — PROCALCITONIN: PROCALCITONIN: 0.13 ng/mL

## 2016-10-12 LAB — HEPARIN LEVEL (UNFRACTIONATED): Heparin Unfractionated: 0.39 IU/mL (ref 0.30–0.70)

## 2016-10-12 LAB — MAGNESIUM: MAGNESIUM: 2 mg/dL (ref 1.7–2.4)

## 2016-10-12 MED ORDER — POTASSIUM CHLORIDE CRYS ER 20 MEQ PO TBCR
40.0000 meq | EXTENDED_RELEASE_TABLET | Freq: Every day | ORAL | Status: AC
  Administered 2016-10-12 – 2016-10-13 (×2): 40 meq via ORAL
  Filled 2016-10-12 (×2): qty 2

## 2016-10-12 MED ORDER — FUROSEMIDE 40 MG PO TABS
40.0000 mg | ORAL_TABLET | Freq: Two times a day (BID) | ORAL | Status: DC
  Administered 2016-10-12: 40 mg via ORAL
  Filled 2016-10-12: qty 1

## 2016-10-12 MED ORDER — METOPROLOL SUCCINATE ER 50 MG PO TB24
50.0000 mg | ORAL_TABLET | Freq: Every day | ORAL | Status: DC
  Administered 2016-10-13 – 2016-10-14 (×2): 50 mg via ORAL
  Filled 2016-10-12 (×2): qty 1

## 2016-10-12 MED ORDER — ENOXAPARIN SODIUM 40 MG/0.4ML ~~LOC~~ SOLN
40.0000 mg | SUBCUTANEOUS | Status: DC
  Administered 2016-10-12 – 2016-10-13 (×2): 40 mg via SUBCUTANEOUS
  Filled 2016-10-12 (×2): qty 0.4

## 2016-10-12 MED ORDER — PHENOL 1.4 % MT LIQD
1.0000 | OROMUCOSAL | Status: DC | PRN
  Administered 2016-10-12: 1 via OROMUCOSAL
  Filled 2016-10-12: qty 177

## 2016-10-12 NOTE — Progress Notes (Signed)
Progress Note  Patient Name: Andrew [name] Date of Encounter: 10/12/2016  Primary Cardiologist: End  Subjective   NO CP, noSOB. Eating, Sitting on edge of bed  Inpatient Medications    Scheduled Meds: . cholecalciferol  1,000 Units Oral Daily  . clopidogrel  75 mg Oral Daily  . FLUoxetine  10 mg Oral Daily  . furosemide  40 mg Intravenous BID  . Influenza vac split quadrivalent PF  0.5 mL Intramuscular Tomorrow-1000  . isosorbide mononitrate  120 mg Oral Daily  . metoprolol succinate  25 mg Oral Daily  . multivitamin  1 tablet Oral QHS  . potassium chloride  40 mEq Oral Daily  . pravastatin  40 mg Oral Daily   Continuous Infusions: . heparin 1,150 Units/hr (10/11/16 1923)   PRN Meds: albuterol, metoprolol tartrate, nitroGLYCERIN   Vital Signs    Vitals:   10/11/16 2100 10/11/16 2344 10/12/16 0400 10/12/16 0759  BP:  (!) 106/55 (!) 133/59 121/72  Pulse:  73 67 86  Resp: 17 (!) 23 20 (!) 25  Temp:  98.7 F (37.1 C) 98.6 F (37 C) 98.3 F (36.8 C)  TempSrc:  Oral Oral Oral  SpO2: 93% 93% 90% 91%  Weight:      Height:        Intake/Output Summary (Last 24 hours) at 10/12/16 1250 Last data filed at 10/11/16 2251  Gross per 24 hour  Intake              240 ml  Output             3110 ml  Net            -2870 ml   Filed Weights   10/09/16 0318 10/09/16 2106  Weight: 182 lb (82.6 kg) 177 lb (80.3 kg)    Telemetry    Paroxysmal atrial tachycardia. Currently sinus rhythm  . - Personally Reviewed  ECG    NSR with nonspecific IVCD, LAD, LVH - Personally Reviewed  Physical Exam  GEN: Well nourished, well developed, in no acute distress - elderly HEENT: normal  Neck: no JVD, carotid bruits, or masses Cardiac: RRR; no murmurs, rubs, or gallops,no edema  Respiratory:  clear to auscultation bilaterally at bases, normal work of breathing GI: soft, nontender, nondistended, + BS MS: no deformity or atrophy  Skin: warm and dry, no rash Neuro:  Alert and  Oriented x 3, Strength and sensation are intact Psych: euthymic mood, full affect    Labs    Chemistry  Recent Labs Lab 10/09/16 0240 10/11/16 0350 10/12/16 0524  NA 138 137 137  K 4.2 3.9 3.3*  CL 107 104 104  CO2 21* 25 25  GLUCOSE 121* 104* 103*  BUN 28* 24* 30*  CREATININE 1.38* 1.36* 1.48*  CALCIUM 8.6* 8.5* 8.6*  PROT 6.3*  --   --   ALBUMIN 3.2*  --   --   AST 46*  --   --   ALT 25  --   --   ALKPHOS 89  --   --   BILITOT 1.2  --   --   GFRNONAA 43* 44* 40*  GFRAA 50* 51* 46*  ANIONGAP 10 8 8      Hematology  Recent Labs Lab 10/10/16 0243 10/11/16 0350 10/12/16 0524  WBC 8.6 9.1 9.0  RBC 4.11* 4.17* 4.15*  HGB 13.6 13.9 13.8  HCT 40.7 41.5 40.8  MCV 99.0 99.5 98.3  MCH 33.1 33.3 33.3  MCHC 33.4  33.5 33.8  RDW 15.0 15.0 14.9  PLT 130* 141* 130*    Cardiac Enzymes  Recent Labs Lab 10/09/16 0240 10/09/16 0458 10/09/16 1156 10/09/16 1813  TROPONINI 0.33* 3.63* 16.20* 34.18*     Recent Labs Lab 10/09/16 0242  TROPIPOC 0.53*     BNP  Recent Labs Lab 10/09/16 0458  BNP 478.7*     DDimer   Recent Labs Lab 10/09/16 0458  DDIMER 1.72*     Radiology    Dg Swallowing Func-speech Pathology  Result Date: 10/12/2016 Objective Swallowing Evaluation: Type of Study: MBS-Modified Barium Swallow Study Patient Details Name: Andrew [name] MRN: [id number] Date of Birth: May 31, 1923 Today's Date: 10/12/2016 Time: SLP Start Time (ACUTE ONLY): 1135-SLP Stop Time (ACUTE ONLY): 1200 SLP Time Calculation (min) (ACUTE ONLY): 25 min Past Medical History: Past Medical History: Diagnosis Date . Allergic rhinitis  . AORTIC STENOSIS  . CAD, ARTERY BYPASS GRAFT  . CAROTID ARTERY STENOSIS 03/07/2010  80% . CHF (congestive heart failure) ([place])  . COPD, mild ([place]) 12/22/2010 . GASTROESOPHAGEAL REFLUX DISEASE  . History of colonic polyps   1999, 2004 . History of prostate cancer 2002  s/p treatment with seeds / radiation . HYPERCHOLESTEROLEMIA  . Lumbar spinal  stenosis 07/13/2009 . Macular degeneration (senile) of retina 04/29/2014 . Pancreatic cyst 01/29/2013  Noted on MRI scan from [facility] on 07/27/2011.  I reviewed report at patient request on 01-29-2013.  "Small cystic focus in the posterior pancreatic head.  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  Follow up MRI is advised. The main pancreatic duct remains normal in appearance."  Patient do . S/P excision of acoustic neuroma  . Skin cancer  . Stroke ([place])  . Thyroid nodule 05/2010  Abnormal biopsy, 83 year old patient and his wife have made informed decision to not pursue surgical resection. Potential risk including cancer has been thoroughly discussed with the patient. Past Surgical History: Past Surgical History: Procedure Laterality Date . CATARACT EXTRACTION   . CATARACT EXTRACTION  2010 . CHOLECYSTECTOMY   . CORONARY ARTERY BYPASS GRAFT   . CRANIECTOMY FOR EXCISION OF ACOUSTIC NEUROMA  1985 . EYE SURGERY   . SHOULDER SURGERY   . TONSILLECTOMY AND ADENOIDECTOMY  1932 . TOTAL KNEE ARTHROPLASTY   . transperineal implatation of palladium   HPI: 83 y.o. male admitted with NSTEMI, acute resp failure, chronic kidney dz, CHF.   Hx infarct posterior limb right internal 2017. Subjective: pleasant, mildly confused. Assessment / Plan / Recommendation CHL IP CLINICAL IMPRESSIONS 10/12/2016 Clinical Impression Pt presents with a normal oropharyngeal swallow, with very few of the changes associated with a normal aging swallow.  He presents with sufficient mastication with excellent bolus control/propulsion, normal timing of swallow initiation, and no penetration of material into the larynx.  There was no aspiration, despite consumption of large, consecutive thin liquid boluses.  Brief scan of the esophagus revealed retention of barium in the distal esophagus.  Orpharyngeal structures and mechanics are healthy and functioning  quite well.  No changes in diet recommended.  Our services will sign off. SLP Visit Diagnosis Dysphagia, unspecified (R13.10) Attention and concentration deficit following -- Frontal lobe and executive function deficit following -- Impact on safety and function No limitations   CHL IP TREATMENT RECOMMENDATION 10/12/2016 Treatment Recommendations No treatment recommended at this time   No flowsheet data found. CHL IP DIET RECOMMENDATION 10/12/2016 SLP Diet Recommendations Thin liquid;Regular solids  Liquid Administration via Cup;Straw Medication Administration Whole meds with liquid Compensations -- Postural Changes --   No flowsheet data found.  No flowsheet data found.  No flowsheet data found.     CHL IP ORAL PHASE 10/12/2016 Oral Phase WFL Oral - Pudding Teaspoon -- Oral - Pudding Cup -- Oral - Honey Teaspoon -- Oral - Honey Cup -- Oral - Nectar Teaspoon -- Oral - Nectar Cup -- Oral - Nectar Straw -- Oral - Thin Teaspoon -- Oral - Thin Cup -- Oral - Thin Straw -- Oral - Puree -- Oral - Mech Soft -- Oral - Regular -- Oral - Multi-Consistency -- Oral - Pill -- Oral Phase - Comment --  CHL IP PHARYNGEAL PHASE 10/12/2016 Pharyngeal Phase WFL Pharyngeal- Pudding Teaspoon -- Pharyngeal -- Pharyngeal- Pudding Cup -- Pharyngeal -- Pharyngeal- Honey Teaspoon -- Pharyngeal -- Pharyngeal- Honey Cup -- Pharyngeal -- Pharyngeal- Nectar Teaspoon -- Pharyngeal -- Pharyngeal- Nectar Cup -- Pharyngeal -- Pharyngeal- Nectar Straw -- Pharyngeal -- Pharyngeal- Thin Teaspoon -- Pharyngeal -- Pharyngeal- Thin Cup -- Pharyngeal -- Pharyngeal- Thin Straw -- Pharyngeal -- Pharyngeal- Puree -- Pharyngeal -- Pharyngeal- Mechanical Soft -- Pharyngeal -- Pharyngeal- Regular -- Pharyngeal -- Pharyngeal- Multi-consistency -- Pharyngeal -- Pharyngeal- Pill -- Pharyngeal -- Pharyngeal Comment --  CHL IP CERVICAL ESOPHAGEAL PHASE 10/12/2016 Cervical Esophageal Phase WFL Pudding Teaspoon -- Pudding Cup -- Honey Teaspoon -- Honey Cup -- Nectar Teaspoon  -- Nectar Cup -- Nectar Straw -- Thin Teaspoon -- Thin Cup -- Thin Straw -- Puree -- Mechanical Soft -- Regular -- Multi-consistency -- Pill -- Cervical Esophageal Comment -- No flowsheet data found. Juan Quam Laurice 10/12/2016, 12:20 PM               Cardiac Studies    Ischemic cardiomyopathy  Cath/PCI:  LHC (2004): One limb of sequential SVG to OM1, OM2, and OM3 occluded.  CV Surgery:  CABG (1991): LIMA to LAD, SVG to PDA, sequential SVG to OM1, OM2, and OM3.  EP Procedures and Devices:  None  Non-Invasive Evaluation(s):  TTE (06/21/16): Mildly dilated left ventricle with mild LVH. LVEF 55-60%. Thickened aortic valve with moderately restricted motion. Mean gradient 22 mmHg. Mild aortic regurgitation and mitral regurgitation. Mild left atrial enlargement. Moderate pulmonic regurgitation. Mild pulmonary hypertension. No significant change from prior echo in 2017.  Carotid artery Doppler (05/28/16): 60-79% left and less than 40% right internal carotid artery stenoses.  His last nuc stress test was from 2014: In 12/14, he had a Lexiscan Cardiolite which showed EF 42% with medium-sized scar in the lateral wall along with mild peri-infarct ischemia. This was deemed a low risk study.   Echocardiogram 10/11/16:  - Since the last study on 06/21/2016 LVEF has decreased from 60-65%   to 40-45% with diffuse hypokinesis and akinesis of the basal   inferior wall.   There is moderate aortic stenosis and moderate regurgitation.   Severe pulmonary hypertension.  Patient Profile     83 y.o. male with NSTEMI, prior CABG, moderate AS, mild dementia, DNR  Assessment & Plan    Atrial tachycardia  - EKG shortly after episode showed sinus rhythm with PACs. Responded well to the metoprolol. We will continue current metoprolol succinate with a mild increase in dose to 50 mg. Very brief repeat episode noted on telemetry.  - Resolved. His daughter did have insight on this saying that  usually he gets very anxious if he has to go to the bathroom and his wife also adds fuel to the  fire so to speak. He is worried that he is going to "have an accident "and during those times he ends up having a panic attack. This may very well have been what we witnessed yesterday morning as he was sitting on the edge of the bed trying to transfer to the commode with tachycardia and chest pain.  NSTEMI  - Trop 0.3>3>30>34  - Noninvasive therapy. Previous lengthy   - Understands morbidity/mortality risks.  - Toprol 50 mg once a day.   - EF mildly to moderately reduced 45%.  CAD  - post CABG  - prior cath reviewed as above   Moderate aortic stenosis  - no change on recent echo, moderate, 36mmHg mean gradient  Dementia  - Confusion, Needed redirection from wife. Pleasant today.  - swallow study  PNA  - Creatinine beginning to rise. I will change him back to by mouth Lasix.   DNR  Possibly be able to discharge on Sunday.   For questions or updates, please contact [facility] Please consult [email or web address] for contact info under Cardiology/STEMI.      Signed, Candee Furbish, MD  10/12/2016, 12:50 PM

## 2016-10-12 NOTE — Progress Notes (Signed)
Modified Barium Swallow Progress Note  Patient Details  Name: Andrew [name] MRN: [id number] Date of Birth: 09-22-1923  Today's Date: 10/12/2016  Modified Barium Swallow completed.  Full report located under Chart Review in the Imaging Section.  Brief recommendations include the following:  Clinical Impression  Pt presents with a normal oropharyngeal swallow, with very few of the changes associated with a normal aging swallow.  He presents with sufficient mastication with excellent bolus control/propulsion, normal timing of swallow initiation, and no penetration of material into the larynx.  There was no aspiration, despite consumption of large, consecutive thin liquid boluses.  Brief scan of the esophagus revealed retention of barium in the distal esophagus.  Orpharyngeal structures and mechanics are healthy and functioning quite well.  No changes in diet recommended.  Our services will sign off.   Swallow Evaluation Recommendations       SLP Diet Recommendations: Thin liquid;Regular solids   Liquid Administration via: Cup;Straw   Medication Administration: Whole meds with liquid   Supervision: Patient able to self feed                    Juan Quam Laurice 10/12/2016,12:20 PM

## 2016-10-12 NOTE — Care Management Important Message (Signed)
Important Message  Patient Details  Name: Andrew [name] MRN: [id number] Date of Birth: [birth year]   Medicare Important Message Given:  Yes    Evangelyn Crouse [place]ena 10/12/2016, 10:08 AM

## 2016-10-12 NOTE — Progress Notes (Addendum)
ANTICOAGULATION CONSULT NOTE - Follow Up Consult  Pharmacy Consult for Heparin Indication: chest pain/ACS, NSTEMI  No Known Allergies  Patient Measurements: Height: 5\' 6"  (167.6 cm) Weight: 177 lb (80.3 kg) IBW/kg (Calculated) : 63.8 Heparin Dosing Weight: 80 kg  Vital Signs: Temp: 98.3 F (36.8 C) (09/21 0759) Temp Source: Oral (09/21 0759) BP: 121/72 (09/21 0759) Pulse Rate: 86 (09/21 0759)  Labs:  Recent Labs  10/09/16 1156 10/09/16 1813  10/10/16 0243 10/11/16 0350 10/11/16 1655 10/12/16 0524  HGB  --   --   < > 13.6 13.9  --  13.8  HCT  --   --   --  40.7 41.5  --  40.8  PLT  --   --   --  130* 141*  --  130*  HEPARINUNFRC 0.32  --   < > 0.32 0.29* 0.38 0.39  CREATININE  --   --   --   --  1.36*  --  1.48*  TROPONINI 16.20* 34.18*  --   --   --   --   --   < > = values in this interval not displayed.  Estimated Creatinine Clearance: 32.4 mL/min (A) (by C-G formula based on SCr of 1.48 mg/dL (H)).  Assessment:   Continues on Heparin drip for NSTEMI.     Heparin level remains therapeutic (0.39) on 1150 units/hr.   Noted heparin may stop today, after ~72 hrs.  Goal of Therapy:  Heparin level 0.3-0.7 units/ml Monitor platelets by anticoagulation protocol: Yes   Plan:   Continue heparin drip at 1150 units/hr  Daily heparin level and CBC while on heparin.  Follow up heparin plans.  Arty Baumgartner, [place] Pager: [phone] 10/12/2016,11:15 AM   Addendum:   IV heparin changing to Lovenox for VTE prophylaxis.   Will begin Lovenox 40 mg sq q24hrs this evening.   Follow up CBC.  Arty Baumgartner, [place] 10/12/2016 2:59 PM

## 2016-10-12 NOTE — Progress Notes (Addendum)
PROGRESS NOTE  Andrew [name]  KDX:[id number] DOB: [birth year] DOA: 10/09/2016 PCP: [name], MD  Brief Narrative:   Andrew [name] is a 83 y.o. male with medical history significant of CAD s/p CABG, moderate AS, diastolic CHF.  Patient presented to the ED with c/o chest pain and SOB.  Took 3 NTG at home and symptoms resolved.  Recently treated for pneumonia these past couple of weeks and had been feeling better.  Central chest pain and pressure with radiation across entire chest.  CXR suggested worsening PNA vs. edema, WBC 9.3, no fever in ED, trop 0.33.  CP free at this time after 3 SL NTG at home.  Troponin rose to 34.     Assessment & Plan:   Principal Problem:   NSTEMI (non-ST elevated myocardial infarction) ([place]) Active Problems:   Chronic diastolic CHF (congestive heart failure) (HCC)   Essential hypertension   Coronary artery disease of native artery of native heart with stable angina pectoris (HCC)   CAP (community acquired pneumonia)  NSTEMI with isolated ST segment elevation in III in setting of known CAD s/p CABG -  Troponin 34 on 9/18 -  Patient and wife elected medical management -  Stop heparin gtt, has been over 72 hours -  Continue plavix, statin, imdur -   Metoprolol and tachycardia being addressed by cardiology -  Appreciate cardiology recommendations  Acute respiratory failure likely due to pulmonary edema from NSTEMI and acute systolic heart failure.  Recently completed antibiotics for the same and was feeling better.  Now with bilateral findings.  Doubt aspiration as patient has not had any choking/coughing problems prior to admission, but given age and dementia, still a possibility.  Will attempt some diuresis -  Stop antibiotics -  SLP evaluation:  Regular with thin liquids -  ECHO:  EF now 40-45 percent with diffuse hypokinesis and akinesis of the basal inferior wall -  Still had rales this morning, so lasix IV continued this morning, but reportedly  clear this afternoon according to cardiology, so diuretics discontinued.    Chronic kidney disease stage III, creatinine at baseline of 1.3-1.7, currently still at baseline  Moderate aortic stenosis with moderate regurgitation and severe pulmonary hypertension, RVSP 65 mmHg  Dementia  - pleasant currently  DVT prophylaxis:  Lovenox  Code Status:  DNR Family Communication:  Patient and son-in-law at bedside Disposition Plan:  Continue heparin.  PT evaluation:  Home health PT   Consultants:   Cardiology  Procedures:  none  Antimicrobials:  Anti-infectives    Start     Dose/Rate Route Frequency Ordered Stop   10/10/16 0300  cefTRIAXone (ROCEPHIN) 1 g in dextrose 5 % 50 mL IVPB  Status:  Discontinued     1 g 100 mL/hr over 30 Minutes Intravenous Every 24 hours 10/09/16 0444 10/09/16 0444   10/10/16 0300  azithromycin (ZITHROMAX) 500 mg in dextrose 5 % 250 mL IVPB  Status:  Discontinued     500 mg 250 mL/hr over 60 Minutes Intravenous Every 24 hours 10/09/16 0444 10/09/16 0444   10/09/16 0600  cefTRIAXone (ROCEPHIN) 1 g in dextrose 5 % 50 mL IVPB  Status:  Discontinued     1 g 100 mL/hr over 30 Minutes Intravenous Daily 10/09/16 0444 10/11/16 0839   10/09/16 0600  azithromycin (ZITHROMAX) 500 mg in dextrose 5 % 250 mL IVPB  Status:  Discontinued     500 mg 250 mL/hr over 60 Minutes Intravenous Daily 10/09/16 0444 10/11/16 0839  10/09/16 0430  azithromycin (ZITHROMAX) 500 mg in dextrose 5 % 250 mL IVPB     500 mg 250 mL/hr over 60 Minutes Intravenous  Once 10/09/16 0422 10/09/16 0641   10/09/16 0430  cefTRIAXone (ROCEPHIN) 1 g in dextrose 5 % 50 mL IVPB     1 g 100 mL/hr over 30 Minutes Intravenous  Once 10/09/16 0422 10/09/16 9562       Subjective:  Denies chest pains but has considerable dyspnea with minimal exertion. Denies nausea, vomiting, lightheadedness, lower extremity swelling. Has been coughing a little.  Objective: Vitals:   10/11/16 2344 10/12/16 0400  10/12/16 0759 10/12/16 1159  BP: (!) 106/55 (!) 133/59 121/72 128/64  Pulse: 73 67 86 86  Resp: (!) 23 20 (!) 25 (!) 28  Temp: 98.7 F (37.1 C) 98.6 F (37 C) 98.3 F (36.8 C) 98.2 F (36.8 C)  TempSrc: Oral Oral Oral Oral  SpO2: 93% 90% 91% 94%  Weight:      Height:        Intake/Output Summary (Last 24 hours) at 10/12/16 1445 Last data filed at 10/12/16 1431  Gross per 24 hour  Intake              240 ml  Output             3910 ml  Net            -3670 ml   Filed Weights   10/09/16 0318 10/09/16 2106  Weight: 82.6 kg (182 lb) 80.3 kg (177 lb)    Examination:  General exam:  Adult Male.  No acute distress.  HEENT:  NCAT, MMM Respiratory system: Rales at the bilateral bases to the midback, no wheezes or rhonchi Cardiovascular system: Regular rate and rhythm.  Warm extremities Gastrointestinal system: Normal active bowel sounds, soft, nondistended, nontender. MSK:  Normal tone and bulk, no lower extremity edema Neuro:  Grossly intact  Data Reviewed: I have personally reviewed following labs and imaging studies  CBC:  Recent Labs Lab 10/09/16 0240 10/09/16 0458 10/10/16 0243 10/11/16 0350 10/12/16 0524  WBC 9.3 8.9 8.6 9.1 9.0  NEUTROABS 6.7  --   --   --   --   HGB 15.0 15.0 13.6 13.9 13.8  HCT 45.1 46.7 40.7 41.5 40.8  MCV 100.0 100.9* 99.0 99.5 98.3  PLT 147* 131* 130* 141* 130*   Basic Metabolic Panel:  Recent Labs Lab 10/09/16 0240 10/11/16 0350 10/12/16 0524  NA 138 137 137  K 4.2 3.9 3.3*  CL 107 104 104  CO2 21* 25 25  GLUCOSE 121* 104* 103*  BUN 28* 24* 30*  CREATININE 1.38* 1.36* 1.48*  CALCIUM 8.6* 8.5* 8.6*  MG  --  2.0 2.0   GFR: Estimated Creatinine Clearance: 32.4 mL/min (A) (by C-G formula based on SCr of 1.48 mg/dL (H)). Liver Function Tests:  Recent Labs Lab 10/09/16 0240  AST 46*  ALT 25  ALKPHOS 89  BILITOT 1.2  PROT 6.3*  ALBUMIN 3.2*    Recent Labs Lab 10/09/16 0240  LIPASE 31   No results for input(s):  AMMONIA in the last 168 hours. Coagulation Profile: No results for input(s): INR, PROTIME in the last 168 hours. Cardiac Enzymes:  Recent Labs Lab 10/09/16 0240 10/09/16 0458 10/09/16 1156 10/09/16 1813  TROPONINI 0.33* 3.63* 16.20* 34.18*   BNP (last 3 results) No results for input(s): PROBNP in the last 8760 hours. HbA1C: No results for input(s): HGBA1C in the last 72 hours.  CBG: No results for input(s): GLUCAP in the last 168 hours. Lipid Profile: No results for input(s): CHOL, HDL, LDLCALC, TRIG, CHOLHDL, LDLDIRECT in the last 72 hours. Thyroid Function Tests: No results for input(s): TSH, T4TOTAL, FREET4, T3FREE, THYROIDAB in the last 72 hours. Anemia Panel: No results for input(s): VITAMINB12, FOLATE, FERRITIN, TIBC, IRON, RETICCTPCT in the last 72 hours. Urine analysis:    Component Value Date/Time   COLORURINE YELLOW 06/29/2015 [place] 06/29/2015 1451   LABSPEC 1.014 06/29/2015 1451   PHURINE 5.5 06/29/2015 1451   GLUCOSEU NEGATIVE 06/29/2015 1451   GLUCOSEU NEGATIVE 07/05/2009 0858   HGBUR NEGATIVE 06/29/2015 1451   BILIRUBINUR NEGATIVE 06/29/2015 1451   KETONESUR NEGATIVE 06/29/2015 1451   PROTEINUR NEGATIVE 06/29/2015 1451   UROBILINOGEN 0.2 07/05/2009 0858   NITRITE NEGATIVE 06/29/2015 1451   LEUKOCYTESUR NEGATIVE 06/29/2015 1451   Sepsis Labs: @LABRCNTIP (procalcitonin:4,lacticidven:4)  ) Recent Results (from the past 240 hour(s))  Blood culture (routine x 2)     Status: None (Preliminary result)   Collection Time: 10/09/16  5:02 AM  Result Value Ref Range Status   Specimen Description BLOOD RIGHT HAND  Final   Special Requests   Final    BOTTLES DRAWN AEROBIC ONLY Blood Culture adequate volume   Culture NO GROWTH 2 DAYS  Final   Report Status PENDING  Incomplete  Blood culture (routine x 2)     Status: None (Preliminary result)   Collection Time: 10/09/16  5:40 AM  Result Value Ref Range Status   Specimen Description BLOOD LEFT  ARM  Final   Special Requests IN PEDIATRIC BOTTLE Blood Culture adequate volume  Final   Culture NO GROWTH 2 DAYS  Final   Report Status PENDING  Incomplete  MRSA PCR Screening     Status: None   Collection Time: 10/09/16 11:51 PM  Result Value Ref Range Status   MRSA by PCR NEGATIVE NEGATIVE Final    Comment:        The GeneXpert MRSA Assay (FDA approved for NASAL specimens only), is one component of a comprehensive MRSA colonization surveillance program. It is not intended to diagnose MRSA infection nor to guide or monitor treatment for MRSA infections.       Radiology Studies: Dg Swallowing Func-speech Pathology  Result Date: 10/12/2016 Objective Swallowing Evaluation: Type of Study: MBS-Modified Barium Swallow Study Patient Details Name: JESON CAMACHO MRN: [id number] Date of Birth: [birth year] Today's Date: 10/12/2016 Time: SLP Start Time (ACUTE ONLY): 1135-SLP Stop Time (ACUTE ONLY): 1200 SLP Time Calculation (min) (ACUTE ONLY): 25 min Past Medical History: Past Medical History: Diagnosis Date . Allergic rhinitis  . AORTIC STENOSIS  . CAD, ARTERY BYPASS GRAFT  . CAROTID ARTERY STENOSIS 03/07/2010  80% . CHF (congestive heart failure) ([place])  . COPD, mild ([place]) 12/22/2010 . GASTROESOPHAGEAL REFLUX DISEASE  . History of colonic polyps   1999, 2004 . History of prostate cancer 2002  s/p treatment with seeds / radiation . HYPERCHOLESTEROLEMIA  . Lumbar spinal stenosis 07/13/2009 . Macular degeneration (senile) of retina 04/29/2014 . Pancreatic cyst 01/29/2013  Noted on MRI scan [facility] on 07/27/2011.  I reviewed report at patient request on 01-29-2013.  "Small cystic focus in the posterior pancreatic head.  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  Follow up MRI is advised. The main pancreatic duct remains normal in appearance."  Patient do . S/P excision of acoustic neuroma  .  Skin  cancer  . Stroke ([place])  . Thyroid nodule 05/2010  Abnormal biopsy, 83 year old patient and his wife have made informed decision to not pursue surgical resection. Potential risk including cancer has been thoroughly discussed with the patient. Past Surgical History: Past Surgical History: Procedure Laterality Date . CATARACT EXTRACTION   . CATARACT EXTRACTION  2010 . CHOLECYSTECTOMY   . CORONARY ARTERY BYPASS GRAFT   . CRANIECTOMY FOR EXCISION OF ACOUSTIC NEUROMA  1985 . EYE SURGERY   . SHOULDER SURGERY   . TONSILLECTOMY AND ADENOIDECTOMY  1932 . TOTAL KNEE ARTHROPLASTY   . transperineal implatation of palladium   HPI: 83 y.o. male admitted with NSTEMI, acute resp failure, chronic kidney dz, CHF.   Hx infarct posterior limb right internal 2017. Subjective: pleasant, mildly confused. Assessment / Plan / Recommendation CHL IP CLINICAL IMPRESSIONS 10/12/2016 Clinical Impression Pt presents with a normal oropharyngeal swallow, with very few of the changes associated with a normal aging swallow.  He presents with sufficient mastication with excellent bolus control/propulsion, normal timing of swallow initiation, and no penetration of material into the larynx.  There was no aspiration, despite consumption of large, consecutive thin liquid boluses.  Brief scan of the esophagus revealed retention of barium in the distal esophagus.  Orpharyngeal structures and mechanics are healthy and functioning quite well.  No changes in diet recommended.  Our services will sign off. SLP Visit Diagnosis Dysphagia, unspecified (R13.10) Attention and concentration deficit following -- Frontal lobe and executive function deficit following -- Impact on safety and function No limitations   CHL IP TREATMENT RECOMMENDATION 10/12/2016 Treatment Recommendations No treatment recommended at this time   No flowsheet data found. CHL IP DIET RECOMMENDATION 10/12/2016 SLP Diet Recommendations Thin liquid;Regular solids Liquid Administration via Cup;Straw  Medication Administration Whole meds with liquid Compensations -- Postural Changes --   No flowsheet data found.  No flowsheet data found.  No flowsheet data found.     CHL IP ORAL PHASE 10/12/2016 Oral Phase WFL Oral - Pudding Teaspoon -- Oral - Pudding Cup -- Oral - Honey Teaspoon -- Oral - Honey Cup -- Oral - Nectar Teaspoon -- Oral - Nectar Cup -- Oral - Nectar Straw -- Oral - Thin Teaspoon -- Oral - Thin Cup -- Oral - Thin Straw -- Oral - Puree -- Oral - Mech Soft -- Oral - Regular -- Oral - Multi-Consistency -- Oral - Pill -- Oral Phase - Comment --  CHL IP PHARYNGEAL PHASE 10/12/2016 Pharyngeal Phase WFL Pharyngeal- Pudding Teaspoon -- Pharyngeal -- Pharyngeal- Pudding Cup -- Pharyngeal -- Pharyngeal- Honey Teaspoon -- Pharyngeal -- Pharyngeal- Honey Cup -- Pharyngeal -- Pharyngeal- Nectar Teaspoon -- Pharyngeal -- Pharyngeal- Nectar Cup -- Pharyngeal -- Pharyngeal- Nectar Straw -- Pharyngeal -- Pharyngeal- Thin Teaspoon -- Pharyngeal -- Pharyngeal- Thin Cup -- Pharyngeal -- Pharyngeal- Thin Straw -- Pharyngeal -- Pharyngeal- Puree -- Pharyngeal -- Pharyngeal- Mechanical Soft -- Pharyngeal -- Pharyngeal- Regular -- Pharyngeal -- Pharyngeal- Multi-consistency -- Pharyngeal -- Pharyngeal- Pill -- Pharyngeal -- Pharyngeal Comment --  CHL IP CERVICAL ESOPHAGEAL PHASE 10/12/2016 Cervical Esophageal Phase WFL Pudding Teaspoon -- Pudding Cup -- Honey Teaspoon -- Honey Cup -- Nectar Teaspoon -- Nectar Cup -- Nectar Straw -- Thin Teaspoon -- Thin Cup -- Thin Straw -- Puree -- Mechanical Soft -- Regular -- Multi-consistency -- Pill -- Cervical Esophageal Comment -- No flowsheet data found. Juan Quam Laurice 10/12/2016, 12:20 PM                Scheduled  Meds: . cholecalciferol  1,000 Units Oral Daily  . clopidogrel  75 mg Oral Daily  . FLUoxetine  10 mg Oral Daily  . furosemide  40 mg Oral BID  . Influenza vac split quadrivalent PF  0.5 mL Intramuscular Tomorrow-1000  . isosorbide mononitrate  120 mg Oral  Daily  . [START ON 10/13/2016] metoprolol succinate  50 mg Oral Daily  . multivitamin  1 tablet Oral QHS  . potassium chloride  40 mEq Oral Daily  . pravastatin  40 mg Oral Daily   Continuous Infusions:    LOS: 3 days    Time spent: 30 min    Janece Canterbury, MD [facility] Pager [phone]  If 7PM-7AM, please contact night-coverage [email or web address] Password [email or web address] 10/12/2016, 2:45 PM

## 2016-10-12 NOTE — Evaluation (Signed)
Clinical/Bedside Swallow Evaluation Patient Details  Name: Andrew [name] MRN: [id number] Date of Birth: [birth year]  Today's Date: 10/12/2016 Time: SLP Start Time (ACUTE ONLY): 1025 SLP Stop Time (ACUTE ONLY): 1040 SLP Time Calculation (min) (ACUTE ONLY): 15 min  Past Medical History:  Past Medical History:  Diagnosis Date  . Allergic rhinitis   . AORTIC STENOSIS   . CAD, ARTERY BYPASS GRAFT   . CAROTID ARTERY STENOSIS 03/07/2010   80%  . CHF (congestive heart failure) ([place])   . COPD, mild ([place]) 12/22/2010  . GASTROESOPHAGEAL REFLUX DISEASE   . History of colonic polyps    1999, 2004  . History of prostate cancer 2002   s/p treatment with seeds / radiation  . HYPERCHOLESTEROLEMIA   . Lumbar spinal stenosis 07/13/2009  . Macular degeneration (senile) of retina 04/29/2014  . Pancreatic cyst 01/29/2013   Noted on MRI scan from [facility] on 07/27/2011.  I reviewed report at patient request on 01-29-2013.  "Small cystic focus in the posterior pancreatic head.  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  Follow up MRI is advised. The main pancreatic duct remains normal in appearance."  Patient do  . S/P excision of acoustic neuroma   . Skin cancer   . Stroke ([place])   . Thyroid nodule 05/2010   Abnormal biopsy, 83 year old patient and his wife have made informed decision to not pursue surgical resection. Potential risk including cancer has been thoroughly discussed with the patient.   Past Surgical History:  Past Surgical History:  Procedure Laterality Date  . CATARACT EXTRACTION    . CATARACT EXTRACTION  2010  . CHOLECYSTECTOMY    . CORONARY ARTERY BYPASS GRAFT    . CRANIECTOMY FOR EXCISION OF ACOUSTIC NEUROMA  1985  . EYE SURGERY    . SHOULDER SURGERY    . TONSILLECTOMY AND ADENOIDECTOMY  1932  . TOTAL KNEE ARTHROPLASTY    . transperineal implatation of palladium     HPI:  83  y.o. male admitted with NSTEMI, acute resp failure, chronic kidney dz, CHF.   Hx infarct posterior limb right internal 2017.   Assessment / Plan / Recommendation Clinical Impression  Pt presents with adequate mastication; + oral attention; + s/s of aspiration during the 3 oz water test, suggesting potential for aspiration.  Recommend proceeding with MBS to elucidate source of deficit, if present. Pt, son in law agree with plan.   SLP Visit Diagnosis: Dysphagia, unspecified (R13.10)    Aspiration Risk    TBA   Diet Recommendation   continue current diet pending today's study       Other  Recommendations     Follow up Recommendations        Frequency and Duration            Prognosis        Swallow Study   General Date of Onset: 10/12/16 HPI: 83 y.o. male admitted with NSTEMI, acute resp failure, chronic kidney dz, CHF.   Hx infarct posterior limb right internal 2017. Type of Study: Bedside Swallow Evaluation Previous Swallow Assessment: no Diet Prior to this Study: Regular;Thin liquids Temperature Spikes Noted: No Respiratory Status: Room air History of Recent Intubation: No Behavior/Cognition: Alert;Confused Oral Cavity Assessment: Within Functional Limits Oral Care Completed by SLP: No Oral Cavity - Dentition: Adequate natural dentition Vision: Functional for self-feeding Self-Feeding Abilities: Able to feed self Patient Positioning: Upright in  bed Baseline Vocal Quality: Normal Volitional Cough: Strong Volitional Swallow: Able to elicit    Oral/Motor/Sensory Function Overall Oral Motor/Sensory Function: Within functional limits   Ice Chips Ice chips: Within functional limits   Thin Liquid Thin Liquid: Impaired Presentation: Cup;Self Fed Pharyngeal  Phase Impairments: Cough - Immediate    Nectar Thick Nectar Thick Liquid: Not tested   Honey Thick Honey Thick Liquid: Not tested   Puree Puree: Within functional limits   Solid   GO   Solid: Within functional  limits        Juan Quam Laurice 10/12/2016,10:46 AM

## 2016-10-13 DIAGNOSIS — I5042 Chronic combined systolic (congestive) and diastolic (congestive) heart failure: Secondary | ICD-10-CM

## 2016-10-13 DIAGNOSIS — I5043 Acute on chronic combined systolic (congestive) and diastolic (congestive) heart failure: Secondary | ICD-10-CM

## 2016-10-13 LAB — CBC
HCT: 42.3 % (ref 39.0–52.0)
HEMOGLOBIN: 14.1 g/dL (ref 13.0–17.0)
MCH: 33 pg (ref 26.0–34.0)
MCHC: 33.3 g/dL (ref 30.0–36.0)
MCV: 99.1 fL (ref 78.0–100.0)
Platelets: 141 10*3/uL — ABNORMAL LOW (ref 150–400)
RBC: 4.27 MIL/uL (ref 4.22–5.81)
RDW: 15 % (ref 11.5–15.5)
WBC: 9.2 10*3/uL (ref 4.0–10.5)

## 2016-10-13 LAB — BASIC METABOLIC PANEL
ANION GAP: 10 (ref 5–15)
BUN: 38 mg/dL — ABNORMAL HIGH (ref 6–20)
CHLORIDE: 104 mmol/L (ref 101–111)
CO2: 21 mmol/L — ABNORMAL LOW (ref 22–32)
Calcium: 8.8 mg/dL — ABNORMAL LOW (ref 8.9–10.3)
Creatinine, Ser: 1.66 mg/dL — ABNORMAL HIGH (ref 0.61–1.24)
GFR, EST AFRICAN AMERICAN: 40 mL/min — AB (ref >60)
GFR, EST NON AFRICAN AMERICAN: 34 mL/min — AB (ref >60)
Glucose, Bld: 99 mg/dL (ref 65–99)
POTASSIUM: 3.7 mmol/L (ref 3.5–5.1)
SODIUM: 135 mmol/L (ref 135–145)

## 2016-10-13 LAB — MAGNESIUM: MAGNESIUM: 2 mg/dL (ref 1.7–2.4)

## 2016-10-13 NOTE — Progress Notes (Signed)
Pt ambulated in the hallway with rolling walker on room air, pt's O2 Sats stayed 90% or better.  Pt tolerated well.

## 2016-10-13 NOTE — Progress Notes (Signed)
Patient had 5 beats run of [place]. On provider notified. Patient asymptomatic.

## 2016-10-13 NOTE — Progress Notes (Signed)
Progress Note  Patient Name: Andrew [name] Date of Encounter: 10/13/2016  Primary Cardiologist: End   Subjective    83 year old gentleman with a history of coronary artery disease, status post CABG, chronic systolic congestive heart failure admitted with a non-ST segment elevation myocardial infarction.  He has moderate aortic stenosis, mild dementia and is a DO NOT RESUSCITATE.  His creatinine has increased slightly to 1.66. He is net I/Os are  -8.6 L.   Inpatient Medications    Scheduled Meds: . cholecalciferol  1,000 Units Oral Daily  . clopidogrel  75 mg Oral Daily  . enoxaparin (LOVENOX) injection  40 mg Subcutaneous Q24H  . FLUoxetine  10 mg Oral Daily  . Influenza vac split quadrivalent PF  0.5 mL Intramuscular Tomorrow-1000  . isosorbide mononitrate  120 mg Oral Daily  . metoprolol succinate  50 mg Oral Daily  . multivitamin  1 tablet Oral QHS  . pravastatin  40 mg Oral Daily   Continuous Infusions:  PRN Meds: albuterol, metoprolol tartrate, nitroGLYCERIN, phenol   Vital Signs    Vitals:   10/12/16 2000 10/13/16 0000 10/13/16 0400 10/13/16 0800  BP: (!) 114/58 (!) 119/59 117/63 114/77  Pulse: 70 74 78   Resp: (!) 27 (!) 30 (!) 25 20  Temp: 98.6 F (37 C) 98.6 F (37 C) 98.7 F (37.1 C)   TempSrc: Oral Oral Oral   SpO2: 92% 94% 92% 94%  Weight:      Height:        Intake/Output Summary (Last 24 hours) at 10/13/16 1216 Last data filed at 10/13/16 5409  Gross per 24 hour  Intake                0 ml  Output             3100 ml  Net            -3100 ml   Filed Weights   10/09/16 0318 10/09/16 2106  Weight: 182 lb (82.6 kg) 177 lb (80.3 kg)    Telemetry    NSR  - Personally Reviewed  ECG     NSR  - Personally Reviewed  Physical Exam   GEN: No acute distress.  Elderly gentleman Neck: No JVD Cardiac: RRR, no murmurs, rubs, or gallops.  Respiratory: Clear to auscultation bilaterally. GI: Soft, nontender, non-distended  MS: No edema; No  deformity. Neuro:  Nonfocal  Psych: Normal affect   Labs    Chemistry Recent Labs Lab 10/09/16 0240 10/11/16 0350 10/12/16 0524 10/13/16 0338  NA 138 137 137 135  K 4.2 3.9 3.3* 3.7  CL 107 104 104 104  CO2 21* 25 25 21*  GLUCOSE 121* 104* 103* 99  BUN 28* 24* 30* 38*  CREATININE 1.38* 1.36* 1.48* 1.66*  CALCIUM 8.6* 8.5* 8.6* 8.8*  PROT 6.3*  --   --   --   ALBUMIN 3.2*  --   --   --   AST 46*  --   --   --   ALT 25  --   --   --   ALKPHOS 89  --   --   --   BILITOT 1.2  --   --   --   GFRNONAA 43* 44* 40* 34*  GFRAA 50* 51* 46* 40*  ANIONGAP 10 8 8 10      Hematology Recent Labs Lab 10/11/16 0350 10/12/16 0524 10/13/16 0338  WBC 9.1 9.0 9.2  RBC 4.17* 4.15* 4.27  HGB 13.9 13.8 14.1  HCT 41.5 40.8 42.3  MCV 99.5 98.3 99.1  MCH 33.3 33.3 33.0  MCHC 33.5 33.8 33.3  RDW 15.0 14.9 15.0  PLT 141* 130* 141*    Cardiac Enzymes Recent Labs Lab 10/09/16 0240 10/09/16 0458 10/09/16 1156 10/09/16 1813  TROPONINI 0.33* 3.63* 16.20* 34.18*    Recent Labs Lab 10/09/16 0242  TROPIPOC 0.53*     BNP Recent Labs Lab 10/09/16 0458  BNP 478.7*     DDimer  Recent Labs Lab 10/09/16 0458  DDIMER 1.72*     Radiology    Dg Swallowing Func-speech Pathology  Result Date: 10/12/2016 Objective Swallowing Evaluation: Type of Study: MBS-Modified Barium Swallow Study Patient Details Name: NEFTALY SWISS MRN: [id number] Date of Birth: [birth year] Today's Date: 10/12/2016 Time: SLP Start Time (ACUTE ONLY): 1135-SLP Stop Time (ACUTE ONLY): 1200 SLP Time Calculation (min) (ACUTE ONLY): 25 min Past Medical History: Past Medical History: Diagnosis Date . Allergic rhinitis  . AORTIC STENOSIS  . CAD, ARTERY BYPASS GRAFT  . CAROTID ARTERY STENOSIS 03/07/2010  80% . CHF (congestive heart failure) ([place])  . COPD, mild ([place]) 12/22/2010 . GASTROESOPHAGEAL REFLUX DISEASE  . History of colonic polyps   1999, 2004 . History of prostate cancer 2002  s/p treatment with seeds / radiation .  HYPERCHOLESTEROLEMIA  . Lumbar spinal stenosis 07/13/2009 . Macular degeneration (senile) of retina 04/29/2014 . Pancreatic cyst 01/29/2013  Noted on MRI scan from [facility] on 07/27/2011.  I reviewed report at patient request on 01-29-2013.  "Small cystic focus in the posterior pancreatic head.  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  Follow up MRI is advised. The main pancreatic duct remains normal in appearance."  Patient do . S/P excision of acoustic neuroma  . Skin cancer  . Stroke ([place])  . Thyroid nodule 05/2010  Abnormal biopsy, 83 year old patient and his wife have made informed decision to not pursue surgical resection. Potential risk including cancer has been thoroughly discussed with the patient. Past Surgical History: Past Surgical History: Procedure Laterality Date . CATARACT EXTRACTION   . CATARACT EXTRACTION  2010 . CHOLECYSTECTOMY   . CORONARY ARTERY BYPASS GRAFT   . CRANIECTOMY FOR EXCISION OF ACOUSTIC NEUROMA  1985 . EYE SURGERY   . SHOULDER SURGERY   . TONSILLECTOMY AND ADENOIDECTOMY  1932 . TOTAL KNEE ARTHROPLASTY   . transperineal implatation of palladium   HPI: 83 y.o. male admitted with NSTEMI, acute resp failure, chronic kidney dz, CHF.   Hx infarct posterior limb right internal 2017. Subjective: pleasant, mildly confused. Assessment / Plan / Recommendation CHL IP CLINICAL IMPRESSIONS 10/12/2016 Clinical Impression Pt presents with a normal oropharyngeal swallow, with very few of the changes associated with a normal aging swallow.  He presents with sufficient mastication with excellent bolus control/propulsion, normal timing of swallow initiation, and no penetration of material into the larynx.  There was no aspiration, despite consumption of large, consecutive thin liquid boluses.  Brief scan of the esophagus revealed retention of barium in the distal esophagus.  Orpharyngeal structures and  mechanics are healthy and functioning quite well.  No changes in diet recommended.  Our services will sign off. SLP Visit Diagnosis Dysphagia, unspecified (R13.10) Attention and concentration deficit following -- Frontal lobe and executive function deficit following -- Impact on safety and function No limitations   CHL IP TREATMENT RECOMMENDATION 10/12/2016 Treatment Recommendations No treatment recommended at this  time   No flowsheet data found. CHL IP DIET RECOMMENDATION 10/12/2016 SLP Diet Recommendations Thin liquid;Regular solids Liquid Administration via Cup;Straw Medication Administration Whole meds with liquid Compensations -- Postural Changes --   No flowsheet data found.  No flowsheet data found.  No flowsheet data found.     CHL IP ORAL PHASE 10/12/2016 Oral Phase WFL Oral - Pudding Teaspoon -- Oral - Pudding Cup -- Oral - Honey Teaspoon -- Oral - Honey Cup -- Oral - Nectar Teaspoon -- Oral - Nectar Cup -- Oral - Nectar Straw -- Oral - Thin Teaspoon -- Oral - Thin Cup -- Oral - Thin Straw -- Oral - Puree -- Oral - Mech Soft -- Oral - Regular -- Oral - Multi-Consistency -- Oral - Pill -- Oral Phase - Comment --  CHL IP PHARYNGEAL PHASE 10/12/2016 Pharyngeal Phase WFL Pharyngeal- Pudding Teaspoon -- Pharyngeal -- Pharyngeal- Pudding Cup -- Pharyngeal -- Pharyngeal- Honey Teaspoon -- Pharyngeal -- Pharyngeal- Honey Cup -- Pharyngeal -- Pharyngeal- Nectar Teaspoon -- Pharyngeal -- Pharyngeal- Nectar Cup -- Pharyngeal -- Pharyngeal- Nectar Straw -- Pharyngeal -- Pharyngeal- Thin Teaspoon -- Pharyngeal -- Pharyngeal- Thin Cup -- Pharyngeal -- Pharyngeal- Thin Straw -- Pharyngeal -- Pharyngeal- Puree -- Pharyngeal -- Pharyngeal- Mechanical Soft -- Pharyngeal -- Pharyngeal- Regular -- Pharyngeal -- Pharyngeal- Multi-consistency -- Pharyngeal -- Pharyngeal- Pill -- Pharyngeal -- Pharyngeal Comment --  CHL IP CERVICAL ESOPHAGEAL PHASE 10/12/2016 Cervical Esophageal Phase WFL Pudding Teaspoon -- Pudding Cup -- Honey  Teaspoon -- Honey Cup -- Nectar Teaspoon -- Nectar Cup -- Nectar Straw -- Thin Teaspoon -- Thin Cup -- Thin Straw -- Puree -- Mechanical Soft -- Regular -- Multi-consistency -- Pill -- Cervical Esophageal Comment -- No flowsheet data found. Juan Quam Laurice 10/12/2016, 12:20 PM               Cardiac Studies     Patient Profile     83 y.o. male with coronary artery disease status post recent non-ST segment elevation myocardial infarction.  Assessment & Plan    1. Coronary artery disease:  Doing well following his non-ST segment elevation myocardial infarction. He has been up ambulating the hallways without too much difficulty. He did become a little fatigued. Continue Plavix 75 mg a day, isosorbide 120 mg a day, metoprolol XL 50 mg a day, Pravachol 40 mg a day  2. Acute on chronic systolic congestive heart failure: His creatinine is up a little bit. The Lasix was discontinued.   We'll keep a close eye on his lung status. The furosemide might need to be restarted as an outpatient.  2. Hyperlipidemia: Continue pravastatin     For questions or updates, please contact [place] Please consult [email or web address] for contact info under Cardiology/STEMI.      Signed, Mertie Moores, MD  10/13/2016, 12:16 PM

## 2016-10-13 NOTE — Progress Notes (Addendum)
PROGRESS NOTE  Andrew [name]  WUX:[id number] DOB: December 02, 1923 DOA: 10/09/2016 PCP: [name], MD  Brief Narrative:   Andrew [name] is a 83 y.o. male with medical history significant of CAD s/p CABG, moderate AS, diastolic CHF.  Patient presented to the ED with c/o chest pain and SOB.  Took 3 NTG at home and symptoms resolved.  Recently treated for pneumonia these past couple of weeks and had been feeling better.  Central chest pain and pressure with radiation across entire chest.  CXR suggested worsening PNA vs. edema, WBC 9.3, no fever in ED, trop 0.33.  CP free at this time after 3 SL NTG at home.  Troponin rose to 34.     Assessment & Plan:   Principal Problem:   NSTEMI (non-ST elevated myocardial infarction) ([place]) Active Problems:   Chronic diastolic CHF (congestive heart failure) ([place])   Essential hypertension   Coronary artery disease of native artery of native heart with stable angina pectoris (H[facility])   CAP (community acquired pneumonia)   Acute on chronic combined systolic and diastolic CHF (congestive heart failure) ([place])  NSTEMI with isolated ST segment elevation in III in setting of known CAD s/p CABG -  Troponin 34 on 9/18 -  Continue plavix, statin, imdur -   Metoprolol and tachycardia being addressed by cardiology -   Appreciate cardiology recommendations  Acute respiratory failure likely due to pulmonary edema from NSTEMI and acute systolic heart failure.   -  SLP evaluation:  Regular with thin liquids -  ECHO:  EF now 40-45 percent with diffuse hypokinesis and akinesis of the basal inferior wall -   Creatinine trending up so discontinued lasix and potassium -  Repeat BMP in a.m.  Chronic kidney disease stage III, creatinine at baseline of 1.3-1.7, rising creatinine -  Repeat BMP in a.m.  Moderate aortic stenosis with moderate regurgitation and severe pulmonary hypertension, RVSP 65 mmHg  Dementia  - pleasant currently  Generalized weakness and dyspnea -  PT evaluation again -  May be ready for discharge tomorrow  DVT prophylaxis:  Lovenox  Code Status:  DNR Family Communication:  Patient and son-in-law at bedside Disposition Plan:  Anticipate discharge home tomorrow with home health therapy  Consultants:   Cardiology  Procedures:  none  Antimicrobials:  Anti-infectives    Start     Dose/Rate Route Frequency Ordered Stop   10/10/16 0300  cefTRIAXone (ROCEPHIN) 1 g in dextrose 5 % 50 mL IVPB  Status:  Discontinued     1 g 100 mL/hr over 30 Minutes Intravenous Every 24 hours 10/09/16 0444 10/09/16 0444   10/10/16 0300  azithromycin (ZITHROMAX) 500 mg in dextrose 5 % 250 mL IVPB  Status:  Discontinued     500 mg 250 mL/hr over 60 Minutes Intravenous Every 24 hours 10/09/16 0444 10/09/16 0444   10/09/16 0600  cefTRIAXone (ROCEPHIN) 1 g in dextrose 5 % 50 mL IVPB  Status:  Discontinued     1 g 100 mL/hr over 30 Minutes Intravenous Daily 10/09/16 0444 10/11/16 0839   10/09/16 0600  azithromycin (ZITHROMAX) 500 mg in dextrose 5 % 250 mL IVPB  Status:  Discontinued     500 mg 250 mL/hr over 60 Minutes Intravenous Daily 10/09/16 0444 10/11/16 0839   10/09/16 0430  azithromycin (ZITHROMAX) 500 mg in dextrose 5 % 250 mL IVPB     500 mg 250 mL/hr over 60 Minutes Intravenous  Once 10/09/16 0422 10/09/16 0641   10/09/16 0430  cefTRIAXone (ROCEPHIN) 1 g in dextrose 5 % 50 mL IVPB     1 g 100 mL/hr over 30 Minutes Intravenous  Once 10/09/16 0422 10/09/16 0621       Subjective:  Denies chest pains. Shortness of breath has improved. Family feels that he is looking better today. He is eating well. He sat in the chair twice yesterday.  Objective: Vitals:   10/12/16 2000 10/13/16 0000 10/13/16 0400 10/13/16 0800  BP: (!) 114/58 (!) 119/59 117/63 114/77  Pulse: 70 74 78   Resp: (!) 27 (!) 30 (!) 25 20  Temp: 98.6 F (37 C) 98.6 F (37 C) 98.7 F (37.1 C)   TempSrc: Oral Oral Oral   SpO2: 92% 94% 92% 94%  Weight:      Height:         Intake/Output Summary (Last 24 hours) at 10/13/16 1432 Last data filed at 10/13/16 1448  Gross per 24 hour  Intake                0 ml  Output             2000 ml  Net            -2000 ml   Filed Weights   10/09/16 0318 10/09/16 2106  Weight: 82.6 kg (182 lb) 80.3 kg (177 lb)    Examination:  General exam:  Adult Male.  No acute distress.  HEENT:  NCAT, MMM Respiratory system: Clear to auscultation bilaterally Cardiovascular system: Regular rate and rhythm, 3/6 systolic murmur.  Warm extremities Gastrointestinal system: Normal active bowel sounds, soft, nondistended, nontender. MSK:  Normal tone and bulk, no lower extremity edema Neuro:  Grossly intact  Data Reviewed: I have personally reviewed following labs and imaging studies  CBC:  Recent Labs Lab 10/09/16 0240 10/09/16 0458 10/10/16 0243 10/11/16 0350 10/12/16 0524 10/13/16 0338  WBC 9.3 8.9 8.6 9.1 9.0 9.2  NEUTROABS 6.7  --   --   --   --   --   HGB 15.0 15.0 13.6 13.9 13.8 14.1  HCT 45.1 46.7 40.7 41.5 40.8 42.3  MCV 100.0 100.9* 99.0 99.5 98.3 99.1  PLT 147* 131* 130* 141* 130* 185*   Basic Metabolic Panel:  Recent Labs Lab 10/09/16 0240 10/11/16 0350 10/12/16 0524 10/13/16 0338  NA 138 137 137 135  K 4.2 3.9 3.3* 3.7  CL 107 104 104 104  CO2 21* 25 25 21*  GLUCOSE 121* 104* 103* 99  BUN 28* 24* 30* 38*  CREATININE 1.38* 1.36* 1.48* 1.66*  CALCIUM 8.6* 8.5* 8.6* 8.8*  MG  --  2.0 2.0 2.0   GFR: Estimated Creatinine Clearance: 28.9 mL/min (A) (by C-G formula based on SCr of 1.66 mg/dL (H)). Liver Function Tests:  Recent Labs Lab 10/09/16 0240  AST 46*  ALT 25  ALKPHOS 89  BILITOT 1.2  PROT 6.3*  ALBUMIN 3.2*    Recent Labs Lab 10/09/16 0240  LIPASE 31   No results for input(s): AMMONIA in the last 168 hours. Coagulation Profile: No results for input(s): INR, PROTIME in the last 168 hours. Cardiac Enzymes:  Recent Labs Lab 10/09/16 0240 10/09/16 0458 10/09/16 1156  10/09/16 1813  TROPONINI 0.33* 3.63* 16.20* 34.18*   BNP (last 3 results) No results for input(s): PROBNP in the last 8760 hours. HbA1C: No results for input(s): HGBA1C in the last 72 hours. CBG: No results for input(s): GLUCAP in the last 168 hours. Lipid Profile: No results for input(s):  CHOL, HDL, LDLCALC, TRIG, CHOLHDL, LDLDIRECT in the last 72 hours. Thyroid Function Tests: No results for input(s): TSH, T4TOTAL, FREET4, T3FREE, THYROIDAB in the last 72 hours. Anemia Panel: No results for input(s): VITAMINB12, FOLATE, FERRITIN, TIBC, IRON, RETICCTPCT in the last 72 hours. Urine analysis:    Component Value Date/Time   COLORURINE YELLOW 06/29/2015 [place] 06/29/2015 1451   LABSPEC 1.014 06/29/2015 1451   PHURINE 5.5 06/29/2015 1451   GLUCOSEU NEGATIVE 06/29/2015 1451   GLUCOSEU NEGATIVE 07/05/2009 0858   HGBUR NEGATIVE 06/29/2015 1451   BILIRUBINUR NEGATIVE 06/29/2015 1451   KETONESUR NEGATIVE 06/29/2015 1451   PROTEINUR NEGATIVE 06/29/2015 1451   UROBILINOGEN 0.2 07/05/2009 0858   NITRITE NEGATIVE 06/29/2015 1451   LEUKOCYTESUR NEGATIVE 06/29/2015 1451   Sepsis Labs: @LABRCNTIP (procalcitonin:4,lacticidven:4)  ) Recent Results (from the past 240 hour(s))  Blood culture (routine x 2)     Status: None (Preliminary result)   Collection Time: 10/09/16  5:02 AM  Result Value Ref Range Status   Specimen Description BLOOD RIGHT HAND  Final   Special Requests   Final    BOTTLES DRAWN AEROBIC ONLY Blood Culture adequate volume   Culture NO GROWTH 4 DAYS  Final   Report Status PENDING  Incomplete  Blood culture (routine x 2)     Status: None (Preliminary result)   Collection Time: 10/09/16  5:40 AM  Result Value Ref Range Status   Specimen Description BLOOD LEFT ARM  Final   Special Requests IN PEDIATRIC BOTTLE Blood Culture adequate volume  Final   Culture NO GROWTH 4 DAYS  Final   Report Status PENDING  Incomplete  MRSA PCR Screening     Status: None     Collection Time: 10/09/16 11:51 PM  Result Value Ref Range Status   MRSA by PCR NEGATIVE NEGATIVE Final    Comment:        The GeneXpert MRSA Assay (FDA approved for NASAL specimens only), is one component of a comprehensive MRSA colonization surveillance program. It is not intended to diagnose MRSA infection nor to guide or monitor treatment for MRSA infections.       Radiology Studies: Dg Swallowing Func-speech Pathology  Result Date: 10/12/2016 Objective Swallowing Evaluation: Type of Study: MBS-Modified Barium Swallow Study Patient Details Name: JERRYL HOLZHAUER MRN: [id number] Date of Birth: 08/14/1923 Today's Date: 10/12/2016 Time: SLP Start Time (ACUTE ONLY): 1135-SLP Stop Time (ACUTE ONLY): 1200 SLP Time Calculation (min) (ACUTE ONLY): 25 min Past Medical History: Past Medical History: Diagnosis Date . Allergic rhinitis  . AORTIC STENOSIS  . CAD, ARTERY BYPASS GRAFT  . CAROTID ARTERY STENOSIS 03/07/2010  80% . CHF (congestive heart failure) ([place])  . COPD, mild ([place]) 12/22/2010 . GASTROESOPHAGEAL REFLUX DISEASE  . History of colonic polyps   1999, 2004 . History of prostate cancer 2002  s/p treatment with seeds / radiation . HYPERCHOLESTEROLEMIA  . Lumbar spinal stenosis 07/13/2009 . Macular degeneration (senile) of retina 04/29/2014 . Pancreatic cyst 01/29/2013  Noted on MRI scan from [facility] on 07/27/2011.  I reviewed report at patient request on 01-29-2013.  "Small cystic focus in the posterior pancreatic head.  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  Follow up MRI is advised. The main pancreatic duct remains normal in appearance."  Patient do . S/P excision of acoustic neuroma  . Skin cancer  . Stroke ([place])  . Thyroid nodule 05/2010  Abnormal biopsy,  83 year old patient and his wife have made informed decision to not pursue surgical resection. Potential risk including cancer has been  thoroughly discussed with the patient. Past Surgical History: Past Surgical History: Procedure Laterality Date . CATARACT EXTRACTION   . CATARACT EXTRACTION  2010 . CHOLECYSTECTOMY   . CORONARY ARTERY BYPASS GRAFT   . CRANIECTOMY FOR EXCISION OF ACOUSTIC NEUROMA  1985 . EYE SURGERY   . SHOULDER SURGERY   . TONSILLECTOMY AND ADENOIDECTOMY  1932 . TOTAL KNEE ARTHROPLASTY   . transperineal implatation of palladium   HPI: 83 y.o. male admitted with NSTEMI, acute resp failure, chronic kidney dz, CHF.   Hx infarct posterior limb right internal 2017. Subjective: pleasant, mildly confused. Assessment / Plan / Recommendation CHL IP CLINICAL IMPRESSIONS 10/12/2016 Clinical Impression Pt presents with a normal oropharyngeal swallow, with very few of the changes associated with a normal aging swallow.  He presents with sufficient mastication with excellent bolus control/propulsion, normal timing of swallow initiation, and no penetration of material into the larynx.  There was no aspiration, despite consumption of large, consecutive thin liquid boluses.  Brief scan of the esophagus revealed retention of barium in the distal esophagus.  Orpharyngeal structures and mechanics are healthy and functioning quite well.  No changes in diet recommended.  Our services will sign off. SLP Visit Diagnosis Dysphagia, unspecified (R13.10) Attention and concentration deficit following -- Frontal lobe and executive function deficit following -- Impact on safety and function No limitations   CHL IP TREATMENT RECOMMENDATION 10/12/2016 Treatment Recommendations No treatment recommended at this time   No flowsheet data found. CHL IP DIET RECOMMENDATION 10/12/2016 SLP Diet Recommendations Thin liquid;Regular solids Liquid Administration via Cup;Straw Medication Administration Whole meds with liquid Compensations -- Postural Changes --   No flowsheet data found.  No flowsheet data found.  No flowsheet data found.     CHL IP ORAL PHASE 10/12/2016 Oral  Phase WFL Oral - Pudding Teaspoon -- Oral - Pudding Cup -- Oral - Honey Teaspoon -- Oral - Honey Cup -- Oral - Nectar Teaspoon -- Oral - Nectar Cup -- Oral - Nectar Straw -- Oral - Thin Teaspoon -- Oral - Thin Cup -- Oral - Thin Straw -- Oral - Puree -- Oral - Mech Soft -- Oral - Regular -- Oral - Multi-Consistency -- Oral - Pill -- Oral Phase - Comment --  CHL IP PHARYNGEAL PHASE 10/12/2016 Pharyngeal Phase WFL Pharyngeal- Pudding Teaspoon -- Pharyngeal -- Pharyngeal- Pudding Cup -- Pharyngeal -- Pharyngeal- Honey Teaspoon -- Pharyngeal -- Pharyngeal- Honey Cup -- Pharyngeal -- Pharyngeal- Nectar Teaspoon -- Pharyngeal -- Pharyngeal- Nectar Cup -- Pharyngeal -- Pharyngeal- Nectar Straw -- Pharyngeal -- Pharyngeal- Thin Teaspoon -- Pharyngeal -- Pharyngeal- Thin Cup -- Pharyngeal -- Pharyngeal- Thin Straw -- Pharyngeal -- Pharyngeal- Puree -- Pharyngeal -- Pharyngeal- Mechanical Soft -- Pharyngeal -- Pharyngeal- Regular -- Pharyngeal -- Pharyngeal- Multi-consistency -- Pharyngeal -- Pharyngeal- Pill -- Pharyngeal -- Pharyngeal Comment --  CHL IP CERVICAL ESOPHAGEAL PHASE 10/12/2016 Cervical Esophageal Phase WFL Pudding Teaspoon -- Pudding Cup -- Honey Teaspoon -- Honey Cup -- Nectar Teaspoon -- Nectar Cup -- Nectar Straw -- Thin Teaspoon -- Thin Cup -- Thin Straw -- Puree -- Mechanical Soft -- Regular -- Multi-consistency -- Pill -- Cervical Esophageal Comment -- No flowsheet data found. Juan Quam Laurice 10/12/2016, 12:20 PM                Scheduled Meds: . cholecalciferol  1,000 Units Oral Daily  . clopidogrel  75 mg  Oral Daily  . enoxaparin (LOVENOX) injection  40 mg Subcutaneous Q24H  . FLUoxetine  10 mg Oral Daily  . Influenza vac split quadrivalent PF  0.5 mL Intramuscular Tomorrow-1000  . isosorbide mononitrate  120 mg Oral Daily  . metoprolol succinate  50 mg Oral Daily  . multivitamin  1 tablet Oral QHS  . pravastatin  40 mg Oral Daily   Continuous Infusions:    LOS: 4 days    Time  spent: 30 min    Janece Canterbury, MD [facility] Pager [phone]  If 7PM-7AM, please contact night-coverage [email or web address] Password [facility] 10/13/2016, 2:32 PM

## 2016-10-14 LAB — CBC
HEMATOCRIT: 41.7 % (ref 39.0–52.0)
Hemoglobin: 14.1 g/dL (ref 13.0–17.0)
MCH: 33.7 pg (ref 26.0–34.0)
MCHC: 33.8 g/dL (ref 30.0–36.0)
MCV: 99.5 fL (ref 78.0–100.0)
Platelets: 141 10*3/uL — ABNORMAL LOW (ref 150–400)
RBC: 4.19 MIL/uL — ABNORMAL LOW (ref 4.22–5.81)
RDW: 14.9 % (ref 11.5–15.5)
WBC: 8.8 10*3/uL (ref 4.0–10.5)

## 2016-10-14 LAB — BASIC METABOLIC PANEL
Anion gap: 7 (ref 5–15)
BUN: 44 mg/dL — AB (ref 6–20)
CHLORIDE: 109 mmol/L (ref 101–111)
CO2: 22 mmol/L (ref 22–32)
CREATININE: 1.6 mg/dL — AB (ref 0.61–1.24)
Calcium: 9 mg/dL (ref 8.9–10.3)
GFR calc Af Amer: 42 mL/min — ABNORMAL LOW (ref >60)
GFR calc non Af Amer: 36 mL/min — ABNORMAL LOW (ref >60)
GLUCOSE: 98 mg/dL (ref 65–99)
POTASSIUM: 4.1 mmol/L (ref 3.5–5.1)
SODIUM: 138 mmol/L (ref 135–145)

## 2016-10-14 LAB — CULTURE, BLOOD (ROUTINE X 2)
CULTURE: NO GROWTH
CULTURE: NO GROWTH
Special Requests: ADEQUATE
Special Requests: ADEQUATE

## 2016-10-14 LAB — MAGNESIUM: MAGNESIUM: 2.4 mg/dL (ref 1.7–2.4)

## 2016-10-14 LAB — PROCALCITONIN: Procalcitonin: 0.12 ng/mL

## 2016-10-14 MED ORDER — TORSEMIDE 20 MG PO TABS: 40.0000 mg | ORAL_TABLET | Freq: Every day | ORAL | 0 refills | Status: DC

## 2016-10-14 MED ORDER — METOPROLOL SUCCINATE ER 50 MG PO TB24: 50.0000 mg | ORAL_TABLET | Freq: Every day | ORAL | 0 refills | Status: DC

## 2016-10-14 NOTE — Progress Notes (Signed)
Progress Note  Patient Name: Andrew [name] Date of Encounter: 10/14/2016  Primary Cardiologist: End   Subjective    83 year old gentleman with a history of coronary artery disease, status post CABG, chronic systolic congestive heart failure admitted with a non-ST segment elevation myocardial infarction.  He has moderate aortic stenosis, mild dementia and is a DO NOT RESUSCITATE.  His creatinine is 1.6  He is net I/Os are  -9.5  L.  Inpatient Medications    Scheduled Meds: . cholecalciferol  1,000 Units Oral Daily  . clopidogrel  75 mg Oral Daily  . enoxaparin (LOVENOX) injection  40 mg Subcutaneous Q24H  . FLUoxetine  10 mg Oral Daily  . Influenza vac split quadrivalent PF  0.5 mL Intramuscular Tomorrow-1000  . isosorbide mononitrate  120 mg Oral Daily  . metoprolol succinate  50 mg Oral Daily  . multivitamin  1 tablet Oral QHS  . pravastatin  40 mg Oral Daily   Continuous Infusions:  PRN Meds: albuterol, metoprolol tartrate, nitroGLYCERIN, phenol   Vital Signs    Vitals:   10/13/16 2000 10/14/16 0000 10/14/16 0100 10/14/16 0400  BP: (!) 122/53 (!) 110/56  117/62  Pulse:      Resp: (!) 22 20 (!) 31 (!) 22  Temp: 98.8 F (37.1 C) 98.8 F (37.1 C)  98.8 F (37.1 C)  TempSrc: Oral Oral  Oral  SpO2: 95% 90% 95% 92%  Weight:      Height:        Intake/Output Summary (Last 24 hours) at 10/14/16 1106 Last data filed at 10/14/16 0404  Gross per 24 hour  Intake                0 ml  Output              875 ml  Net             -875 ml   Filed Weights   10/09/16 0318 10/09/16 2106  Weight: 182 lb (82.6 kg) 177 lb (80.3 kg)    Telemetry    NSR , few PVCs  - Personally Reviewed  ECG      - Personally Reviewed  Physical Exam   GEN: No acute distress.   Neck: No JVD Cardiac: RR, no murmurs, rubs, or gallops.  Respiratory: Clear to auscultation bilaterally. GI: Soft, nontender, non-distended  MS: No edema; No deformity. Neuro:  Nonfocal  Psych: Normal  affect   Labs    Chemistry Recent Labs Lab 10/09/16 0240  10/12/16 0524 10/13/16 0338 10/14/16 0600  NA 138  < > 137 135 138  K 4.2  < > 3.3* 3.7 4.1  CL 107  < > 104 104 109  CO2 21*  < > 25 21* 22  GLUCOSE 121*  < > 103* 99 98  BUN 28*  < > 30* 38* 44*  CREATININE 1.38*  < > 1.48* 1.66* 1.60*  CALCIUM 8.6*  < > 8.6* 8.8* 9.0  PROT 6.3*  --   --   --   --   ALBUMIN 3.2*  --   --   --   --   AST 46*  --   --   --   --   ALT 25  --   --   --   --   ALKPHOS 89  --   --   --   --   BILITOT 1.2  --   --   --   --  GFRNONAA 43*  < > 40* 34* 36*  GFRAA 50*  < > 46* 40* 42*  ANIONGAP 10  < > 8 10 7   < > = values in this interval not displayed.   Hematology Recent Labs Lab 10/12/16 0524 10/13/16 0338 10/14/16 0600  WBC 9.0 9.2 8.8  RBC 4.15* 4.27 4.19*  HGB 13.8 14.1 14.1  HCT 40.8 42.3 41.7  MCV 98.3 99.1 99.5  MCH 33.3 33.0 33.7  MCHC 33.8 33.3 33.8  RDW 14.9 15.0 14.9  PLT 130* 141* 141*    Cardiac Enzymes Recent Labs Lab 10/09/16 0240 10/09/16 0458 10/09/16 1156 10/09/16 1813  TROPONINI 0.33* 3.63* 16.20* 34.18*    Recent Labs Lab 10/09/16 0242  TROPIPOC 0.53*     BNP Recent Labs Lab 10/09/16 0458  BNP 478.7*     DDimer  Recent Labs Lab 10/09/16 0458  DDIMER 1.72*     Radiology    Dg Swallowing Func-speech Pathology  Result Date: 10/12/2016 Objective Swallowing Evaluation: Type of Study: MBS-Modified Barium Swallow Study Patient Details Name: ISAIAH CIANCI MRN: [id number] Date of Birth: 1923-05-01 Today's Date: 10/12/2016 Time: SLP Start Time (ACUTE ONLY): 1135-SLP Stop Time (ACUTE ONLY): 1200 SLP Time Calculation (min) (ACUTE ONLY): 25 min Past Medical History: Past Medical History: Diagnosis Date . Allergic rhinitis  . AORTIC STENOSIS  . CAD, ARTERY BYPASS GRAFT  . CAROTID ARTERY STENOSIS 03/07/2010  80% . CHF (congestive heart failure) ([place])  . COPD, mild ([place]) 12/22/2010 . GASTROESOPHAGEAL REFLUX DISEASE  . History of colonic polyps   1999,  2004 . History of prostate cancer 2002  s/p treatment with seeds / radiation . HYPERCHOLESTEROLEMIA  . Lumbar spinal stenosis 07/13/2009 . Macular degeneration (senile) of retina 04/29/2014 . Pancreatic cyst 01/29/2013  Noted on MRI scan from [facility] on 07/27/2011.  I reviewed report at patient request on 01-29-2013.  "Small cystic focus in the posterior pancreatic head.  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  Follow up MRI is advised. The main pancreatic duct remains normal in appearance."  Patient do . S/P excision of acoustic neuroma  . Skin cancer  . Stroke ([place])  . Thyroid nodule 05/2010  Abnormal biopsy, 83 year old patient and his wife have made informed decision to not pursue surgical resection. Potential risk including cancer has been thoroughly discussed with the patient. Past Surgical History: Past Surgical History: Procedure Laterality Date . CATARACT EXTRACTION   . CATARACT EXTRACTION  2010 . CHOLECYSTECTOMY   . CORONARY ARTERY BYPASS GRAFT   . CRANIECTOMY FOR EXCISION OF ACOUSTIC NEUROMA  1985 . EYE SURGERY   . SHOULDER SURGERY   . TONSILLECTOMY AND ADENOIDECTOMY  1932 . TOTAL KNEE ARTHROPLASTY   . transperineal implatation of palladium   HPI: 83 y.o. male admitted with NSTEMI, acute resp failure, chronic kidney dz, CHF.   Hx infarct posterior limb right internal 2017. Subjective: pleasant, mildly confused. Assessment / Plan / Recommendation CHL IP CLINICAL IMPRESSIONS 10/12/2016 Clinical Impression Pt presents with a normal oropharyngeal swallow, with very few of the changes associated with a normal aging swallow.  He presents with sufficient mastication with excellent bolus control/propulsion, normal timing of swallow initiation, and no penetration of material into the larynx.  There was no aspiration, despite consumption of large, consecutive thin liquid boluses.  Brief scan of the esophagus  revealed retention of barium in the distal esophagus.  Orpharyngeal structures and mechanics  are healthy and functioning quite well.  No changes in diet recommended.  Our services will sign off. SLP Visit Diagnosis Dysphagia, unspecified (R13.10) Attention and concentration deficit following -- Frontal lobe and executive function deficit following -- Impact on safety and function No limitations   CHL IP TREATMENT RECOMMENDATION 10/12/2016 Treatment Recommendations No treatment recommended at this time   No flowsheet data found. CHL IP DIET RECOMMENDATION 10/12/2016 SLP Diet Recommendations Thin liquid;Regular solids Liquid Administration via Cup;Straw Medication Administration Whole meds with liquid Compensations -- Postural Changes --   No flowsheet data found.  No flowsheet data found.  No flowsheet data found.     CHL IP ORAL PHASE 10/12/2016 Oral Phase WFL Oral - Pudding Teaspoon -- Oral - Pudding Cup -- Oral - Honey Teaspoon -- Oral - Honey Cup -- Oral - Nectar Teaspoon -- Oral - Nectar Cup -- Oral - Nectar Straw -- Oral - Thin Teaspoon -- Oral - Thin Cup -- Oral - Thin Straw -- Oral - Puree -- Oral - Mech Soft -- Oral - Regular -- Oral - Multi-Consistency -- Oral - Pill -- Oral Phase - Comment --  CHL IP PHARYNGEAL PHASE 10/12/2016 Pharyngeal Phase WFL Pharyngeal- Pudding Teaspoon -- Pharyngeal -- Pharyngeal- Pudding Cup -- Pharyngeal -- Pharyngeal- Honey Teaspoon -- Pharyngeal -- Pharyngeal- Honey Cup -- Pharyngeal -- Pharyngeal- Nectar Teaspoon -- Pharyngeal -- Pharyngeal- Nectar Cup -- Pharyngeal -- Pharyngeal- Nectar Straw -- Pharyngeal -- Pharyngeal- Thin Teaspoon -- Pharyngeal -- Pharyngeal- Thin Cup -- Pharyngeal -- Pharyngeal- Thin Straw -- Pharyngeal -- Pharyngeal- Puree -- Pharyngeal -- Pharyngeal- Mechanical Soft -- Pharyngeal -- Pharyngeal- Regular -- Pharyngeal -- Pharyngeal- Multi-consistency -- Pharyngeal -- Pharyngeal- Pill -- Pharyngeal -- Pharyngeal Comment --  CHL IP CERVICAL ESOPHAGEAL PHASE  10/12/2016 Cervical Esophageal Phase WFL Pudding Teaspoon -- Pudding Cup -- Honey Teaspoon -- Honey Cup -- Nectar Teaspoon -- Nectar Cup -- Nectar Straw -- Thin Teaspoon -- Thin Cup -- Thin Straw -- Puree -- Mechanical Soft -- Regular -- Multi-consistency -- Pill -- Cervical Esophageal Comment -- No flowsheet data found. Juan Quam Laurice 10/12/2016, 12:20 PM               Cardiac Studies     Patient Profile     83 y.o. male with CAD And acute on chronic systolic congestive heart failure.  Assessment & Plan    1. Acute on chronic systolic congestive heart failure: He is diuresed. He's able to lie flat without any difficulties. I discussed the case with Dr. Sheran Fava. He is stable and may go home today. We'll change his furosemide to torsemide 40 mg a day. We'll need to see him back in a transition of care clinic spot this week.  2. Coronary artery disease: He is not having any episodes of angina.  For questions or updates, please contact [place] Please consult [email or web address] for contact info under Cardiology/STEMI.      Signed, Mertie Moores, MD  10/14/2016, 11:07 AM

## 2016-10-14 NOTE — Care Management Note (Signed)
Case Management Note Marvetta Gibbons RN, BSN Unit 4E-Case Manager [phone]  Patient Details  Name: Andrew [name] MRN: [id number] Date of Birth: 1923/11/12  Subjective/Objective:    Pt admitted with NSTEMI                Action/Plan: PTA pt lived at home with wife- lives at [place] at [employer] -- PT pending- CM to follow  Expected Discharge Date:  10/14/16               Expected Discharge Plan:  [place]  In-House Referral:  Clinical Social Work  Discharge planning Services  CM Consult  Post Acute Care Choice:  Home Health Choice offered to:  Patient, Spouse  DME Arranged:  N/A DME Agency:  NA  HH Arranged:  RN, PT, Nurse's Aide [place] Agency:  [place]  Status of Service:  Completed, signed off  If discussed at [place] of Stay Meetings, dates discussed:    Discharge Disposition: home/home health  Additional Comments:  10/14/16- 1130- Marvetta Gibbons RN, CM- pt for d/c home today to [place] with wife- orders for HHRN/PT/aide placed- spoke with pt and wife at bedside- pt has walker and w/c at home- no other DME needs at this time- choice offered to wife/pt for [facility] agencies for [facility]- as with RN ordered they can not use in house [facility] needs per wife they will use [facility] services- referral called to [facility] with [facility] - referral accepted.   10/12/16- 1200- [name] RN, CM - call received from [place] at [facility] regarding potential d/c needs for pt- pt will most likely need [place] has in-house therapy with [facility]- however if nursing is needed will need to select a [facility] provider- CM will f/u on d/c needs prior to discharge-   Dahlia Client Romeo Rabon, RN 10/14/2016, 11:32 AM

## 2016-10-14 NOTE — Discharge Summary (Addendum)
Physician Discharge Summary  Andrew [name] ZDG:[id number] DOB: 1923-12-29 DOA: 10/09/2016  PCP: [name], MD  Admit date: 10/09/2016 Discharge date: 10/14/2016  Admitted From: [facility] Disposition:  [facility]  Recommendations for Outpatient Follow-up:  1. Follow up with cardiology, Dr. Saunders Revel, in 7-10 days 2. Changed Lasix to torsemide  Home Health:  PT, RN, aide  Equipment/Devices:  None, already has a walker at home   Discharge Condition:  Stable, improved CODE STATUS:  DO NOT RESUSCITATE  Diet recommendation:  Healthy heart   Brief/Interim Summary:  Andrew [name] a 83 y.o.malewith medical history significant of CAD s/p CABG, moderate AS, diastolic CHF. Patient presented to the ED with c/o chest pain and SOB. Took 3 NTG at home and symptoms resolved. Recently treated for pneumonia and had been feeling better.  He presented with central chest pain and pressure with radiation across entire chest.  CXR suggested worsening PNA vs. edema, WBC 9.3, no fever in ED, trop 0.33. CP resolved after 3 SL NTG at home.  Troponin rose to 34.    Discharge Diagnoses:  Principal Problem:   NSTEMI (non-ST elevated myocardial infarction) ([place]) Active Problems:   Chronic diastolic CHF (congestive heart failure) (HCC)   Essential hypertension   Coronary artery disease of native artery of native heart with stable angina pectoris (HCC)   CAP (community acquired pneumonia)   Acute on chronic combined systolic and diastolic CHF (congestive heart failure) ([place])  NSTEMI with isolated ST segment elevation in III in setting of known CAD s/p CABG -  Troponin 34 on 9/18 -  Continue plavix, statin, imdur -   Metoprolol and tachycardia being addressed by cardiology -   Appreciate cardiology recommendations  NSVT -  Potassium supplemented, magnesium within normal limits -  Beta blocker increased from 25 mg once daily to 50 mg once daily  Acute  respiratory failure likely due to pulmonary edema from NSTEMI and acute systolic heart failure.   -  SLP evaluation:  Regular with thin liquids -  ECHO:  EF now 40-45 percent with diffuse hypokinesis and akinesis of the basal inferior wall -   diuresed with IV Lasix initially but his creatinine trended up so diuretics were held for a day and his creatinine started to trend down -  Changed Lasix to torsemide at the recommendation of cardiology and the patient will have close outpatient follow-up for repeat BMP and exam  Chronic kidney disease stage III, creatinine at baseline of 1.3-1.7, creatinine 1.6 at time of discharge  Moderate aortic stenosis with moderate regurgitation and severe pulmonary hypertension, RVSP 65 mmHg  Dementia, mild, no behavioral disturbance  Generalized weakness and dyspnea, able to maintain oxygen levels greater than 88% on room air without supplement oxygen and ambulate to the nursing station and back prior to discharge -  PT recommended home health PT which has been ordered  Discharge Instructions  Discharge Instructions    ([place]) Call MD:  Anytime you have any of the following symptoms: 1) 3 pound weight gain in 24 hours or 5 pounds in 1 week 2) shortness of breath, with or without a dry hacking cough 3) swelling in the hands, feet or stomach 4) if you have to sleep on extra pillows at night in order to breathe.    Complete by:  As directed    Call MD for:    Complete by:  As directed    Recurrent chest pain  Call MD for:  difficulty breathing, headache or visual disturbances    Complete by:  As directed    Call MD for:  extreme fatigue    Complete by:  As directed    Call MD for:  persistant dizziness or light-headedness    Complete by:  As directed    Call MD for:  persistant nausea and vomiting    Complete by:  As directed    Diet - low sodium heart healthy    Complete by:  As directed    Discharge instructions    Complete by:  As  directed    Please stop your Lasix/furosemide. This medication will be replaced by torsemide. Your metoprolol dose has been increased from 25 mg to 50 mg once daily.  A new prescription was sent to your pharmacy.   Increase activity slowly    Complete by:  As directed        Medication List    STOP taking these medications   furosemide 40 MG tablet Commonly known as:  LASIX     TAKE these medications   acetaminophen 500 MG tablet Commonly known as:  TYLENOL Take 500 mg by mouth every 6 (six) hours as needed (pain).   albuterol 108 (90 Base) MCG/ACT inhaler Commonly known as:  PROVENTIL HFA;VENTOLIN HFA Inhale 2 puffs into the lungs every 6 (six) hours as needed for wheezing or shortness of breath.   cholecalciferol 1000 units tablet Commonly known as:  VITAMIN D Take 1,000 Units by mouth daily.   clopidogrel 75 MG tablet Commonly known as:  PLAVIX Take 1 tablet (75 mg total) by mouth daily.   FLUoxetine 10 MG tablet Commonly known as:  PROZAC Take 1 tablet (10 mg total) by mouth daily.   isosorbide mononitrate 120 MG 24 hr tablet Commonly known as:  IMDUR Take 1 tablet (120 mg total) by mouth daily.   metoprolol succinate 50 MG 24 hr tablet Commonly known as:  TOPROL-XL Take 1 tablet (50 mg total) by mouth daily. Take with or immediately following a meal. What changed:  medication strength  how much to take  additional instructions   NEPHRO-VITE PO Take 1 tablet by mouth daily.   nitroGLYCERIN 0.4 MG SL tablet Commonly known as:  NITROSTAT Place 0.4 mg under the tongue every 5 (five) minutes as needed for chest pain (Max 3 doses with 15 minutes call 911).   OVER THE COUNTER MEDICATION Place 1 drop into both eyes daily as needed (dry eyes). OTC lubricating eye drop   potassium chloride 10 MEQ tablet Commonly known as:  K-DUR,KLOR-CON TAKE TWO TABLETS BY MOUTH DAILY What changed:  See the new instructions.   pravastatin 40 MG tablet Commonly known as:   PRAVACHOL TAKE 1 TABLET DAILY  ( CALL TO SCHEDULE AN 6 MONTHS APPOINTMENT FOR FUTURE REFILLS )   PRESERVISION/LUTEIN Caps Take 1 capsule by mouth 2 (two) times daily.   torsemide 20 MG tablet Commonly known as:  DEMADEX Take 2 tablets (40 mg total) by mouth daily.      Follow-up Information    Copland, Frederico Hamman, MD Follow up.   Specialty:  Family Medicine Contact information: [place] [address] [phone]        Nelva Bush, MD. Schedule an appointment as soon as possible for a visit in 1 week(s).   Specialty:  Cardiology Contact information: [address] [place] [address] [phone]  No Known Allergies  Consultations: [facility]   Procedures/Studies: Dg Chest 2 View  Result Date: 10/02/2016 CLINICAL DATA:  Dry cough with DOE x 6 months, chest pain, CHF, COPD, CAD, hx CABG EXAM: CHEST  2 VIEW COMPARISON:  06/21/2015 FINDINGS: Status post median sternotomy. Heart is mildly enlarged. There is perihilar peribronchial thickening. More focal opacity is identified at the right lung base and left mid lung zone and right lateral mid lung zone, compatible with infectious infiltrates. No pleural effusions are present. IMPRESSION: 1. Suspect multifocal infiltrates superimposed on bronchitic changes. Followup PA and lateral chest X-ray is recommended in 3-4 weeks following trial of antibiotic therapy to ensure resolution and exclude underlying malignancy. 2. Stable cardiomegaly. Electronically Signed   By: Nolon Nations M.D.   On: 10/02/2016 13:54   Dg Chest Portable 1 View  Result Date: 10/09/2016 CLINICAL DATA:  Chest pain for 1 day EXAM: PORTABLE CHEST 1 VIEW COMPARISON:  10/02/2016, 06/21/2015 FINDINGS: Post sternotomy changes. Mild cardiomegaly with aortic atherosclerosis. Increased right mid lung peripheral opacity suspect for a pneumonia. Increased bibasilar opacity. Possible tiny left effusion. No  pneumothorax IMPRESSION: 1. Increased bilateral right greater than left pulmonary opacities suspect for worsening pneumonia. Radiographic follow-up to resolution recommended 2. Borderline cardiomegaly.  Possible tiny left effusion. Electronically Signed   By: Donavan Foil M.D.   On: 10/09/2016 03:34   Dg Swallowing Func-speech Pathology  Result Date: 10/12/2016 Objective Swallowing Evaluation: Type of Study: MBS-Modified Barium Swallow Study Patient Details Name: Andrew [name] MRN: [id number] Date of Birth: 01/02/1924 Today's Date: 10/12/2016 Time: SLP Start Time (ACUTE ONLY): 1135-SLP Stop Time (ACUTE ONLY): 1200 SLP Time Calculation (min) (ACUTE ONLY): 25 min Past Medical History: Past Medical History: Diagnosis Date . Allergic rhinitis  . AORTIC STENOSIS  . CAD, ARTERY BYPASS GRAFT  . CAROTID ARTERY STENOSIS 03/07/2010  80% . CHF (congestive heart failure) ([place])  . COPD, mild ([place]) 12/22/2010 . GASTROESOPHAGEAL REFLUX DISEASE  . History of colonic polyps   1999, 2004 . History of prostate cancer 2002  s/p treatment with seeds / radiation . HYPERCHOLESTEROLEMIA  . Lumbar spinal stenosis 07/13/2009 . Macular degeneration (senile) of retina 04/29/2014 . Pancreatic cyst 01/29/2013  Noted on MRI scan from [facility] on 07/27/2011.  I reviewed report at patient request on 01-29-2013.  "Small cystic focus in the posterior pancreatic head.  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  Follow up MRI is advised. The main pancreatic duct remains normal in appearance."  Patient do . S/P excision of acoustic neuroma  . Skin cancer  . Stroke ([place])  . Thyroid nodule 05/2010  Abnormal biopsy, 83 year old patient and his wife have made informed decision to not pursue surgical resection. Potential risk including cancer has been thoroughly discussed with the patient. Past Surgical History: Past Surgical History: Procedure Laterality  Date . CATARACT EXTRACTION   . CATARACT EXTRACTION  2010 . CHOLECYSTECTOMY   . CORONARY ARTERY BYPASS GRAFT   . CRANIECTOMY FOR EXCISION OF ACOUSTIC NEUROMA  1985 . EYE SURGERY   . SHOULDER SURGERY   . TONSILLECTOMY AND ADENOIDECTOMY  1932 . TOTAL KNEE ARTHROPLASTY   . transperineal implatation of palladium   HPI: 83 y.o. male admitted with NSTEMI, acute resp failure, chronic kidney dz, CHF.   Hx infarct posterior limb right internal 2017. Subjective: pleasant, mildly confused. Assessment / Plan / Recommendation CHL IP CLINICAL IMPRESSIONS 10/12/2016 Clinical Impression  Pt presents with a normal oropharyngeal swallow, with very few of the changes associated with a normal aging swallow.  He presents with sufficient mastication with excellent bolus control/propulsion, normal timing of swallow initiation, and no penetration of material into the larynx.  There was no aspiration, despite consumption of large, consecutive thin liquid boluses.  Brief scan of the esophagus revealed retention of barium in the distal esophagus.  Orpharyngeal structures and mechanics are healthy and functioning quite well.  No changes in diet recommended.  Our services will sign off. SLP Visit Diagnosis Dysphagia, unspecified (R13.10) Attention and concentration deficit following -- Frontal lobe and executive function deficit following -- Impact on safety and function No limitations   CHL IP TREATMENT RECOMMENDATION 10/12/2016 Treatment Recommendations No treatment recommended at this time   No flowsheet data found. CHL IP DIET RECOMMENDATION 10/12/2016 SLP Diet Recommendations Thin liquid;Regular solids Liquid Administration via Cup;Straw Medication Administration Whole meds with liquid Compensations -- Postural Changes --   No flowsheet data found.  No flowsheet data found.  No flowsheet data found.     CHL IP ORAL PHASE 10/12/2016 Oral Phase WFL Oral - Pudding Teaspoon -- Oral - Pudding Cup -- Oral - Honey Teaspoon -- Oral - Honey Cup -- Oral -  Nectar Teaspoon -- Oral - Nectar Cup -- Oral - Nectar Straw -- Oral - Thin Teaspoon -- Oral - Thin Cup -- Oral - Thin Straw -- Oral - Puree -- Oral - Mech Soft -- Oral - Regular -- Oral - Multi-Consistency -- Oral - Pill -- Oral Phase - Comment --  CHL IP PHARYNGEAL PHASE 10/12/2016 Pharyngeal Phase WFL Pharyngeal- Pudding Teaspoon -- Pharyngeal -- Pharyngeal- Pudding Cup -- Pharyngeal -- Pharyngeal- Honey Teaspoon -- Pharyngeal -- Pharyngeal- Honey Cup -- Pharyngeal -- Pharyngeal- Nectar Teaspoon -- Pharyngeal -- Pharyngeal- Nectar Cup -- Pharyngeal -- Pharyngeal- Nectar Straw -- Pharyngeal -- Pharyngeal- Thin Teaspoon -- Pharyngeal -- Pharyngeal- Thin Cup -- Pharyngeal -- Pharyngeal- Thin Straw -- Pharyngeal -- Pharyngeal- Puree -- Pharyngeal -- Pharyngeal- Mechanical Soft -- Pharyngeal -- Pharyngeal- Regular -- Pharyngeal -- Pharyngeal- Multi-consistency -- Pharyngeal -- Pharyngeal- Pill -- Pharyngeal -- Pharyngeal Comment --  CHL IP CERVICAL ESOPHAGEAL PHASE 10/12/2016 Cervical Esophageal Phase WFL Pudding Teaspoon -- Pudding Cup -- Honey Teaspoon -- Honey Cup -- Nectar Teaspoon -- Nectar Cup -- Nectar Straw -- Thin Teaspoon -- Thin Cup -- Thin Straw -- Puree -- Mechanical Soft -- Regular -- Multi-consistency -- Pill -- Cervical Esophageal Comment -- No flowsheet data found. Andrew [name] 10/12/2016, 12:20 PM               Subjective:  Still gets Jamarien Rodkey of breath with exertion. Denies chest pains, nausea, lightheadedness. He is still somewhat weak in his legs.  Discharge Exam: Vitals:   10/14/16 0100 10/14/16 0400  BP:  117/62  Pulse:    Resp: (!) 31 (!) 22  Temp:  98.8 F (37.1 C)  SpO2: 95% 92%   Vitals:   10/13/16 2000 10/14/16 0000 10/14/16 0100 10/14/16 0400  BP: (!) 122/53 (!) 110/56  117/62  Pulse:      Resp: (!) 22 20 (!) 31 (!) 22  Temp: 98.8 F (37.1 C) 98.8 F (37.1 C)  98.8 F (37.1 C)  TempSrc: Oral Oral  Oral  SpO2: 95% 90% 95% 92%  Weight:      Height:         General: Pt is alert, awake, not in acute distress Cardiovascular: RRR, 3/6 systolic murmur heard  at the right upper sternal border, gallop Respiratory: Rales at the bilateral bases, no wheezes or rhonchi Abdominal: Soft, NT, ND, bowel sounds + Extremities: Trace bilateral ankle edema, no cyanosis    The results of significant diagnostics from this hospitalization (including imaging, microbiology, ancillary and laboratory) are listed below for reference.     Microbiology: Recent Results (from the past 240 hour(s))  Blood culture (routine x 2)     Status: None (Preliminary result)   Collection Time: 10/09/16  5:02 AM  Result Value Ref Range Status   Specimen Description BLOOD RIGHT HAND  Final   Special Requests   Final    BOTTLES DRAWN AEROBIC ONLY Blood Culture adequate volume   Culture NO GROWTH 4 DAYS  Final   Report Status PENDING  Incomplete  Blood culture (routine x 2)     Status: None (Preliminary result)   Collection Time: 10/09/16  5:40 AM  Result Value Ref Range Status   Specimen Description BLOOD LEFT ARM  Final   Special Requests IN PEDIATRIC BOTTLE Blood Culture adequate volume  Final   Culture NO GROWTH 4 DAYS  Final   Report Status PENDING  Incomplete  MRSA PCR Screening     Status: None   Collection Time: 10/09/16 11:51 PM  Result Value Ref Range Status   MRSA by PCR NEGATIVE NEGATIVE Final    Comment:        The GeneXpert MRSA Assay (FDA approved for NASAL specimens only), is one component of a comprehensive MRSA colonization surveillance program. It is not intended to diagnose MRSA infection nor to guide or monitor treatment for MRSA infections.      Labs: BNP (last 3 results)  Recent Labs  10/09/16 0458  BNP 250.5*   Basic Metabolic Panel:  Recent Labs Lab 10/09/16 0240 10/11/16 0350 10/12/16 0524 10/13/16 0338 10/14/16 0600  NA 138 137 137 135 138  K 4.2 3.9 3.3* 3.7 4.1  CL 107 104 104 104 109  CO2 21* 25 25 21* 22  GLUCOSE  121* 104* 103* 99 98  BUN 28* 24* 30* 38* 44*  CREATININE 1.38* 1.36* 1.48* 1.66* 1.60*  CALCIUM 8.6* 8.5* 8.6* 8.8* 9.0  MG  --  2.0 2.0 2.0 2.4   Liver Function Tests:  Recent Labs Lab 10/09/16 0240  AST 46*  ALT 25  ALKPHOS 89  BILITOT 1.2  PROT 6.3*  ALBUMIN 3.2*    Recent Labs Lab 10/09/16 0240  LIPASE 31   No results for input(s): AMMONIA in the last 168 hours. CBC:  Recent Labs Lab 10/09/16 0240  10/10/16 0243 10/11/16 0350 10/12/16 0524 10/13/16 0338 10/14/16 0600  WBC 9.3  < > 8.6 9.1 9.0 9.2 8.8  NEUTROABS 6.7  --   --   --   --   --   --   HGB 15.0  < > 13.6 13.9 13.8 14.1 14.1  HCT 45.1  < > 40.7 41.5 40.8 42.3 41.7  MCV 100.0  < > 99.0 99.5 98.3 99.1 99.5  PLT 147*  < > 130* 141* 130* 141* 141*  < > = values in this interval not displayed. Cardiac Enzymes:  Recent Labs Lab 10/09/16 0240 10/09/16 0458 10/09/16 1156 10/09/16 1813  TROPONINI 0.33* 3.63* 16.20* 34.18*   BNP: Invalid input(s): POCBNP CBG: No results for input(s): GLUCAP in the last 168 hours. D-Dimer No results for input(s): DDIMER in the last 72 hours. Hgb A1c No results for input(s): HGBA1C in the last 72  hours. Lipid Profile No results for input(s): CHOL, HDL, LDLCALC, TRIG, CHOLHDL, LDLDIRECT in the last 72 hours. Thyroid function studies No results for input(s): TSH, T4TOTAL, T3FREE, THYROIDAB in the last 72 hours.  Invalid input(s): FREET3 Anemia work up No results for input(s): VITAMINB12, FOLATE, FERRITIN, TIBC, IRON, RETICCTPCT in the last 72 hours. Urinalysis    Component Value Date/Time   COLORURINE YELLOW 06/29/2015 1451   APPEARANCEUR CLEAR 06/29/2015 1451   LABSPEC 1.014 06/29/2015 1451   PHURINE 5.5 06/29/2015 1451   GLUCOSEU NEGATIVE 06/29/2015 1451   GLUCOSEU NEGATIVE 07/05/2009 0858   HGBUR NEGATIVE 06/29/2015 1451   BILIRUBINUR NEGATIVE 06/29/2015 1451   KETONESUR NEGATIVE 06/29/2015 1451   PROTEINUR NEGATIVE 06/29/2015 1451   UROBILINOGEN 0.2  07/05/2009 0858   NITRITE NEGATIVE 06/29/2015 1451   LEUKOCYTESUR NEGATIVE 06/29/2015 1451   Sepsis Labs Invalid input(s): PROCALCITONIN,  WBC,  LACTICIDVEN   Time coordinating discharge: Over 30 minutes  SIGNED:   Janece Canterbury, MD  [facility] 10/14/2016, 11:30 AM Pager   If 7PM-7AM, please contact night-coverage [email or web address] Password [email or web address]

## 2016-10-14 NOTE — Progress Notes (Signed)
Physical Therapy Treatment Patient Details Name: Andrew [name] MRN: [id number] DOB: Feb 21, 1923 Today's Date: 10/14/2016    History of Present Illness Andrew [name] is a 83 y.o. male with medical history significant of CAD s/p CABG, moderate AS, diastolic CHF.  Patient presented to the ED with c/o chest pain and SOB. Dx with NSTEMI, acute on chronic CHF, CAP, and hypertension.      PT Comments    Continuing work on functional mobility and activity tolerance;  Noted DOE with amb on Room Air; cues to self-monitor for activity tolerance; worked on standing balance as well; Noted for dc today; I agree with HHservices set up by Steffanie Dunn, Case Mgr; spoke with her and we briefly discussed possibility of setting up for [facility] as well -- she will pass it on to [place];   Session conducted on Room Air; O2 sats decr to 86% observed lowest (with good pleth wave); able to recover to 90% with controlled breathing; to 95% with seated rest; RN notified     Follow Up Recommendations  Home health PT;Supervision/Assistance - 24 hour     Equipment Recommendations  None recommended by PT (well-equipped)    Recommendations for Other Services OT consult     Precautions / Restrictions Precautions Precautions: Fall    Mobility  Bed Mobility Overal bed mobility: Needs Assistance Bed Mobility: Supine to Sit     Supine to sit: Supervision     General bed mobility comments: supervision for safety, vc for hand placement, scooting hips to EoB  Transfers Overall transfer level: Needs assistance Equipment used: Rolling walker (2 wheeled) Transfers: Sit to/from Stand Sit to Stand: Min guard;Min assist         General transfer comment: min guard for safety, vc for hand placement on bed surface, anterior pelvic tilt to come all the way to standing; One instance of loss of balance backward leading to an unanticipated sit back to the bed  Ambulation/Gait Ambulation/Gait assistance: Min  assist Ambulation Distance (Feet): 60 Feet Assistive device: Rolling walker (2 wheeled) Gait Pattern/deviations: Step-through pattern;Decreased step length - right;Decreased step length - left;Leaning posteriorly Gait velocity: slowed   General Gait Details: Min assist to steady; cues for shifting weight into RW to decr posterior lean; Noted DOE approaching 3/4; cues to self-monitor for activity tolerance   Stairs            Wheelchair Mobility    Modified Rankin (Stroke Patients Only)       Balance     Sitting balance-Leahy Scale: Good       Standing balance-Leahy Scale: Poor Standing balance comment: requires UE support on RW for static balance; tends to posterior lean; stood and worked oon getting to and maintaining anterior weight shift                            Cognition Arousal/Alertness: Awake/alert Behavior During Therapy: WFL for tasks assessed/performed Overall Cognitive Status: Impaired/Different from baseline Area of Impairment: Memory                       Following Commands: Follows multi-step commands consistently     Problem Solving: Requires verbal cues;Requires tactile cues        Exercises      General Comments General comments (skin integrity, edema, etc.): Session conducted on Room Air; O2 sats decr to 86% observed lowest (with good pleth wave); able to recover to  90% with controlled breathing; to 95% with seated rest; RN notified      Pertinent Vitals/Pain Pain Assessment: Faces Faces Pain Scale: Hurts a little bit Pain Location: R neck with R rotation; tells me he woke with a "crick" in his neck;  Pain Descriptors / Indicators: Aching Pain Intervention(s): Heat applied    Home Living                      Prior Function            PT Goals (current goals can now be found in the care plan section) Acute Rehab PT Goals Patient Stated Goal: go home PT Goal Formulation: With patient/family Time For  Goal Achievement: 10/25/16 Potential to Achieve Goals: Good Progress towards PT goals: Progressing toward goals    Frequency    Min 3X/week      PT Plan Current plan remains appropriate    Co-evaluation              AM-PAC PT "6 Clicks" Daily Activity  Outcome Measure  Difficulty turning over in bed (including adjusting bedclothes, sheets and blankets)?: A Little Difficulty moving from lying on back to sitting on the side of the bed? : A Little Difficulty sitting down on and standing up from a chair with arms (e.g., wheelchair, bedside commode, etc,.)?: A Lot Help needed moving to and from a bed to chair (including a wheelchair)?: A Little Help needed walking in hospital room?: A Little Help needed climbing 3-5 steps with a railing? : A Lot 6 Click Score: 16    End of Session Equipment Utilized During Treatment: Gait belt Activity Tolerance: Patient tolerated treatment well;Other (comment) (though some noted DOE) Patient left: in chair;with call bell/phone within reach;with family/visitor present Nurse Communication: Mobility status;Other (comment) (and O2 sat response to activity) PT Visit Diagnosis: Other abnormalities of gait and mobility (R26.89);Muscle weakness (generalized) (M62.81);Difficulty in walking, not elsewhere classified (R26.2);Unsteadiness on feet (R26.81)     Time: 6962-9528 PT Time Calculation (min) (ACUTE ONLY): 39 min  Charges:  $Gait Training: 23-37 mins $Therapeutic Activity: 8-22 mins                    G Codes:       Roney Marion, PT  Acute Rehabilitation Services Pager [phone] Office [place] 10/14/2016, 3:00 PM

## 2016-10-14 NOTE — Progress Notes (Signed)
Patient VTACH twice. Asymptomatic. Asleep. On call provider notified.

## 2016-10-16 ENCOUNTER — Telehealth: Payer: Self-pay | Admitting: *Deleted

## 2016-10-16 NOTE — Telephone Encounter (Signed)
Transition Care Management Follow-up Telephone Call   Date discharged? 10/14/16   How have you been since you were released from the hospital? "ok"   Do you understand why you were in the hospital? yes   Do you understand the discharge instructions? yes   Where were you discharged to? Home/independent living. [facility] to [facility]   Items Reviewed:  Medications reviewed: yes  Allergies reviewed: yes  Dietary changes reviewed: no  Referrals reviewed: no   Functional Questionnaire:   Activities of Daily Living (ADLs):   He states they are independent in the following: independent in all areas    Any transportation issues/concerns?: no   Any patient concerns? no   Confirmed importance and date/time of follow-up visits scheduled yes  Provider Appointment booked with Dr [name] 10/3  Confirmed with patient if condition begins to worsen call PCP or go to the ER.  Patient was given the office number and encouraged to call back with question or concerns.  : yes

## 2016-10-19 ENCOUNTER — Encounter: Payer: Self-pay | Admitting: Family Medicine

## 2016-10-19 ENCOUNTER — Ambulatory Visit (INDEPENDENT_AMBULATORY_CARE_PROVIDER_SITE_OTHER): Payer: Medicare Other | Admitting: Family Medicine

## 2016-10-19 VITALS — BP 98/54 | HR 66 | Temp 98.2°F | Resp 17 | Ht 66.0 in | Wt 173.0 lb

## 2016-10-19 DIAGNOSIS — R21 Rash and other nonspecific skin eruption: Secondary | ICD-10-CM

## 2016-10-19 DIAGNOSIS — R238 Other skin changes: Secondary | ICD-10-CM

## 2016-10-19 MED ORDER — MUPIROCIN CALCIUM 2 % EX CREA: 1.0000 "application " | TOPICAL_CREAM | Freq: Three times a day (TID) | CUTANEOUS | 0 refills | Status: DC

## 2016-10-19 MED ORDER — VALACYCLOVIR HCL 1 G PO TABS: 1000.0000 mg | ORAL_TABLET | Freq: Three times a day (TID) | ORAL | 0 refills | Status: DC

## 2016-10-19 NOTE — Patient Instructions (Addendum)
I am suspicious for shingles on the left side, but as there is a small amount of rash on the right side, bacterial infection is also possible. Start Valtrex 1 pill 3 times per day, apply the Bactroban ointment/cream 3 times per day, cleanse area at least once or twice a day with soap and water and dry thoroughly. Recheck in 3 days unless significantly improved, then can follow-up with Dr. [name] as planned next Wednesday.  Return to the clinic or go to the nearest emergency room if any of your symptoms worsen or new symptoms occur.   Rash A rash is a change in the color of the skin. A rash can also change the way your skin feels. There are many different conditions and factors that can cause a rash. Follow these instructions at home: Pay attention to any changes in your symptoms. Follow these instructions to help with your condition: Medicine Take or apply over-the-counter and prescription medicines only as told by your health care provider. These may include:  Corticosteroid cream.  Anti-itch lotions.  Oral antihistamines.  Skin Care  Apply cool compresses to the affected areas.  Try taking a bath with: ? Epsom salts. Follow the instructions on the packaging. You can get these at your local pharmacy or grocery store. ? Baking soda. Pour a small amount into the bath as told by your health care provider. ? Colloidal oatmeal. Follow the instructions on the packaging. You can get this at your local pharmacy or grocery store.  Try applying baking soda paste to your skin. Stir water into baking soda until it reaches a paste-like consistency.  Do not scratch or rub your skin.  Avoid covering the rash. Make sure the rash is exposed to air as much as possible. General instructions  Avoid hot showers or baths, which can make itching worse. A cold shower may help.  Avoid scented soaps, detergents, and perfumes. Use gentle soaps, detergents, perfumes, and other cosmetic products.  Avoid  any substance that causes your rash. Keep a journal to help track what causes your rash. Write down: ? What you eat. ? What cosmetic products you use. ? What you drink. ? What you wear. This includes jewelry.  Keep all follow-up visits as told by your health care provider. This is important. Contact a health care provider if:  You sweat at night.  You lose weight.  You urinate more than normal.  You feel weak.  You vomit.  Your skin or the whites of your eyes look yellow (jaundice).  Your skin: ? Tingles. ? Is numb.  Your rash: ? Does not go away after several days. ? Gets worse.  You are: ? Unusually thirsty. ? More tired than normal.  You have: ? New symptoms. ? Pain in your abdomen. ? A fever. ? Diarrhea. Get help right away if:  You develop a rash that covers all or most of your body. The rash may or may not be painful.  You develop blisters that: ? Are on top of the rash. ? Grow larger or grow together. ? Are painful. ? Are inside your nose or mouth.  You develop a rash that: ? Looks like purple pinprick-sized spots all over your body. ? Has a "bull's eye" or looks like a target. ? Is not related to sun exposure, is red and painful, and causes your skin to peel. This information is not intended to replace advice given to you by your health care provider. Make sure you discuss any  questions you have with your health care provider. Document Released: 12/29/2001 Document Revised: 06/14/2015 Document Reviewed: 05/26/2014 Elsevier Interactive Patient Education  2017 [place], which is also known as herpes zoster, is an infection that causes a painful skin rash and fluid-filled blisters. Shingles is not related to genital herpes, which is a sexually transmitted infection. Shingles only develops in people who:  Have had chickenpox.  Have received the chickenpox vaccine. (This is rare.)  What are the causes? Shingles is caused by  varicella-zoster virus (VZV). This is the same virus that causes chickenpox. After exposure to VZV, the virus stays in the body in an inactive (dormant) state. Shingles develops if the virus reactivates. This can happen many years after the initial exposure to VZV. It is not known what causes this virus to reactivate. What increases the risk? People who have had chickenpox or received the chickenpox vaccine are at risk for shingles. Infection is more common in people who:  Are older than age 67.  Have a weakened defense (immune) system, such as those with HIV, AIDS, or cancer.  Are taking medicines that weaken the immune system, such as transplant medicines.  Are under great stress.  What are the signs or symptoms? Early symptoms of this condition include itching, tingling, and pain in an area on your skin. Pain may be described as burning, stabbing, or throbbing. A few days or weeks after symptoms start, a painful red rash appears, usually on one side of the body in a bandlike or beltlike pattern. The rash eventually turns into fluid-filled blisters that break open, scab over, and dry up in about 2-3 weeks. At any time during the infection, you may also develop:  A fever.  Chills.  A headache.  An upset stomach.  How is this diagnosed? This condition is diagnosed with a skin exam. Sometimes, skin or fluid samples are taken from the blisters before a diagnosis is made. These samples are examined under a microscope or sent to a lab for testing. How is this treated? There is no specific cure for this condition. Your health care provider will probably prescribe medicines to help you manage pain, recover more quickly, and avoid long-term problems. Medicines may include:  Antiviral drugs.  Anti-inflammatory drugs.  Pain medicines.  If the area involved is on your face, you may be referred to a specialist, such as an eye doctor (ophthalmologist) or an ear, nose, and throat (ENT) doctor  to help you avoid eye problems, chronic pain, or disability. Follow these instructions at home: Medicines  Take medicines only as directed by your health care provider.  Apply an anti-itch or numbing cream to the affected area as directed by your health care provider. Blister and Rash Care  Take a cool bath or apply cool compresses to the area of the rash or blisters as directed by your health care provider. This may help with pain and itching.  Keep your rash covered with a loose bandage (dressing). Wear loose-fitting clothing to help ease the pain of material rubbing against the rash.  Keep your rash and blisters clean with mild soap and cool water or as directed by your health care provider.  Check your rash every day for signs of infection. These include redness, swelling, and pain that lasts or increases.  Do not pick your blisters.  Do not scratch your rash. General instructions  Rest as directed by your health care provider.  Keep all follow-up visits as directed by your  health care provider. This is important.  Until your blisters scab over, your infection can cause chickenpox in people who have never had it or been vaccinated against it. To prevent this from happening, avoid contact with other people, especially: ? Babies. ? Pregnant women. ? Children who have eczema. ? Elderly people who have transplants. ? People who have chronic illnesses, such as leukemia or AIDS. Contact a health care provider if:  Your pain is not relieved with prescribed medicines.  Your pain does not get better after the rash heals.  Your rash looks infected. Signs of infection include redness, swelling, and pain that lasts or increases. Get help right away if:  The rash is on your face or nose.  You have facial pain, pain around your eye area, or loss of feeling on one side of your face.  You have ear pain or you have ringing in your ear.  You have loss of taste.  Your condition gets  worse. This information is not intended to replace advice given to you by your health care provider. Make sure you discuss any questions you have with your health care provider. Document Released: 01/08/2005 Document Revised: 09/04/2015 Document Reviewed: 11/19/2013 Elsevier Interactive Patient Education  2017 [employer].     IF you received an x-ray today, you will receive an invoice from [facility]. Please contact [facility] at [phone] with questions or concerns regarding your invoice.   IF you received labwork today, you will receive an invoice from [facility]. Please contact [facility] at [phone] with questions or concerns regarding your invoice.   Our billing staff will not be able to assist you with questions regarding bills from these companies.  You will be contacted with the lab results as soon as they are available. The fastest way to get your results is to activate your My Chart account. Instructions are located on the last page of this paperwork. If you have not heard from [place] regarding the results in 2 weeks, please contact this office.

## 2016-10-19 NOTE — Progress Notes (Signed)
Subjective:  By signing my name below, I, Andrew [name], attest that this documentation has been prepared under the direction and in the presence of Andrew Agreste, MD Electronically Signed: Ladene [name], ED Scribe 10/19/2016 at 11:26 AM.   Patient ID: Andrew [name], male    DOB: April 27, 1923, 83 y.o.   MRN: [id number]  Chief Complaint  Patient presents with  . Follow-up    heart attack, bed sores    HPI Andrew [name] is a 83 y.o. male who presents to Primary Care at [name] for follow-up. New pt to me. PCP: [name], MD. He was just admitted 9/18-23 for chest pain and dyspnea. Diagnosed with non-ST elevated MI  Acute on chronic diastolic CHF, CAP. Troponin increased to 34. Continued on Plavix, statin and indura. Sent home with home health PT, back to independent living at [facility] and has a follow-up with Dr. [name] for 10/3. Appointment with cardiology on 10/1. Seeing me today with concerns for bed sores. H/o multiple medical problems including chronic renal insufficiency, COPD, CAD, CHF by problem list.   Pt reports  rash to his left buttocks first noticed last night. Pt states the area just began as soreness, burning and a raw sensation last night but his wife noticed crusting to the area this morning. No treatments tried PTA. No other new extremity rash.   Pt also presents with a rash to the groin. Wife states that pt was prescribed moisture barrier in the hospital for the rash, however, she has not applied the cream to the area since discharge.    Patient Active Problem List   Diagnosis Date Noted  . Acute on chronic combined systolic and diastolic CHF (congestive heart failure) ([place])   . CAP (community acquired pneumonia) 10/09/2016  . NSTEMI (non-ST elevated myocardial infarction) ([place]) 10/09/2016  . Chest pain   . Cough 10/01/2016  . Chills 10/01/2016  . History of stroke 10/01/2016  . Coronary artery disease of native artery of native heart with stable angina  pectoris ([place]) 06/28/2016  . Bilateral carotid artery stenosis 06/28/2016  . Hyperlipidemia LDL goal <70 06/28/2016  . Advanced Planning: DNR 04/09/2016  . Essential hypertension   . Alcohol abuse   . Thrombocytopenia ([place])   . Right-sided cerebrovascular accident (CVA) ([place])   . Chronic renal insufficiency, stage III (moderate) 06/02/2014  . Macular degeneration (senile) of retina 04/29/2014  . Major depressive disorder, single episode 06/11/2013  . TIA (transient ischemic attack) 06/03/2013  . Pancreatic cyst 01/29/2013  . OSA (obstructive sleep apnea) 12/26/2012  . Chronic diastolic CHF (congestive heart failure) ([place]) 08/19/2012  . COPD, minimal-mild 12/22/2010  . S/P excision of acoustic neuroma 06/18/2010  . Pulmonary nodule 06/17/2010  . Vertigo 06/14/2010  . THYROID NODULE 03/13/2010  . PVD- moderate carotid disease 03/07/2010  . Spinal stenosis, lumbar region, with neurogenic claudication 07/13/2009  . ALLERGIC RHINITIS 09/01/2008  . COLONIC POLYPS, HX OF 09/01/2008  . HYPERCHOLESTEROLEMIA 06/18/2008  . Angina, class II ([place]) 06/18/2008  . Hx of CABG 06/18/2008  . Moderate aortic stenosis 06/18/2008  . GASTROESOPHAGEAL REFLUX DISEASE 06/18/2008   Past Medical History:  Diagnosis Date  . Allergic rhinitis   . AORTIC STENOSIS   . CAD, ARTERY BYPASS GRAFT   . CAROTID ARTERY STENOSIS 03/07/2010   80%  . CHF (congestive heart failure) ([place])   . COPD, mild ([place]) 12/22/2010  . GASTROESOPHAGEAL REFLUX DISEASE   . History of colonic polyps    1999, 2004  .  History of prostate cancer 2002   s/p treatment with seeds / radiation  . HYPERCHOLESTEROLEMIA   . Lumbar spinal stenosis 07/13/2009  . Macular degeneration (senile) of retina 04/29/2014  . Pancreatic cyst 01/29/2013   Noted on MRI scan from [facility] on 07/27/2011.  I reviewed report at patient request on 01-29-2013.  "Small cystic focus in the posterior pancreatic head.  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  Follow up MRI is advised. The main pancreatic duct remains normal in appearance."  Patient do  . S/P excision of acoustic neuroma   . Skin cancer   . Stroke ([place])   . Thyroid nodule 05/2010   Abnormal biopsy, 83 year old patient and his wife have made informed decision to not pursue surgical resection. Potential risk including cancer has been thoroughly discussed with the patient.   Past Surgical History:  Procedure Laterality Date  . CATARACT EXTRACTION    . CATARACT EXTRACTION  2010  . CHOLECYSTECTOMY    . CORONARY ARTERY BYPASS GRAFT    . CRANIECTOMY FOR EXCISION OF ACOUSTIC NEUROMA  1985  . EYE SURGERY    . SHOULDER SURGERY    . TONSILLECTOMY AND ADENOIDECTOMY  1932  . TOTAL KNEE ARTHROPLASTY    . transperineal implatation of palladium     No Known Allergies Prior to Admission medications   Medication Sig Start Date End Date Taking? Authorizing Provider  acetaminophen (TYLENOL) 500 MG tablet Take 500 mg by mouth every 6 (six) hours as needed (pain).    [provider]  albuterol (PROVENTIL HFA;VENTOLIN HFA) 108 (90 Base) MCG/ACT inhaler Inhale 2 puffs into the lungs every 6 (six) hours as needed for wheezing or shortness of breath. 08/06/16   Copland, Frederico Hamman, MD  B Complex-C-Folic Acid (NEPHRO-VITE PO) Take 1 tablet by mouth daily.      [provider]  cholecalciferol (VITAMIN D) 1000 units tablet Take 1,000 Units by mouth daily.    [provider]  clopidogrel (PLAVIX) 75 MG tablet Take 1 tablet (75 mg total) by mouth daily. 06/28/16   End, Harrell Gave, MD  FLUoxetine (PROZAC) 10 MG tablet Take 1 tablet (10 mg total) by mouth daily. 10/08/16   Copland, Frederico Hamman, MD  isosorbide mononitrate (IMDUR) 120 MG 24 hr tablet Take 1 tablet (120 mg total) by mouth daily. 01/24/16   Larey Dresser, MD  metoprolol succinate (TOPROL-XL) 50 MG 24 hr tablet Take 1 tablet  (50 mg total) by mouth daily. Take with or immediately following a meal. 10/15/16   Short, Noah Delaine, MD  Multiple Vitamins-Minerals (PRESERVISION/LUTEIN) CAPS Take 1 capsule by mouth 2 (two) times daily.    [provider]  nitroGLYCERIN (NITROSTAT) 0.4 MG SL tablet Place 0.4 mg under the tongue every 5 (five) minutes as needed for chest pain (Max 3 doses with 15 minutes call 911).    [provider]  OVER THE COUNTER MEDICATION Place 1 drop into both eyes daily as needed (dry eyes). OTC lubricating eye drop    [provider]  potassium chloride (K-DUR,KLOR-CON) 10 MEQ tablet TAKE TWO TABLETS BY MOUTH DAILY  Patient taking differently: TAKE 20MEQ BY MOUTH ONCE DAILY 08/27/16   End, Harrell Gave, MD  pravastatin (PRAVACHOL) 40 MG tablet TAKE 1 TABLET DAILY  ( CALL TO SCHEDULE AN 6 MONTHS APPOINTMENT FOR FUTURE REFILLS ) 04/19/16   Larey Dresser, MD  torsemide (DEMADEX) 20 MG tablet  Take 2 tablets (40 mg total) by mouth daily. 10/14/16   Janece Canterbury, MD   Social History   Social History  . Marital status: Married    Spouse name: N/A  . Number of children: N/A  . Years of education: N/A   Occupational History  . retired  At [employer]    and [employer] [place] Topics  . Smoking status: Former Smoker    Packs/day: 2.00    Years: 20.00    Types: Cigarettes    Quit date: 01/22/1961  . Smokeless tobacco: Never Used  . Alcohol use 4.2 oz/week    7 Shots of liquor per week     Comment: 1 drink nightly  . Drug use: No  . Sexual activity: No   Other Topics Concern  . Not on file   Social History Narrative  . No narrative on file   Review of Systems  Skin: Positive for rash and wound.      Objective:   Physical Exam  Constitutional: He is oriented to person, place, and time. He appears well-developed and well-nourished. No distress.  HENT:  Head: Normocephalic and atraumatic.  Eyes: Conjunctivae and EOM are normal.  Neck: Neck supple.  No tracheal deviation present.  Cardiovascular: Normal rate.   Pulmonary/Chest: Effort normal. No respiratory distress.  Genitourinary:  Genitourinary Comments: Slight irritation at the superior cleft. At the L upper buttock there are a few small erythematous patches with overlying small vesicles and a few crusted areas limited to the L side only. I do not see any ulcerations. Similar patches of erythema with overlying vesicles into L medial thigh lateral to the scrotum. Few patches of same rash of R upper thigh, lateral to scrotum but nothing to R buttock. Wound culture and HSV cultures obtained of L buttock lesion although minimal expression.   Mild hyperpigmentation into inguinal fold of groin. Very minimal erythema.   Musculoskeletal: Normal range of motion.  Neurological: He is alert and oriented to person, place, and time.  Skin: Skin is warm and dry. Rash noted.  Psychiatric: He has a normal mood and affect. His behavior is normal.  Nursing note and vitals reviewed.  Vitals:   10/19/16 1054  BP: (!) 98/54  Pulse: 66  Resp: 17  Temp: 98.2 F (36.8 C)  TempSrc: Oral  SpO2: 98%  Weight: 173 lb (78.5 kg)  Height: 5\' 6"  (1.676 m)      Assessment & Plan:    CARLES FLOREA is a 82 y.o. male Rash and nonspecific skin eruption - Plan: WOUND CULTURE, Herpes simplex virus culture, mupirocin cream (BACTROBAN) 2 %, valACYclovir (VALTREX) 1000 MG tablet  Vesicular rash - Plan: WOUND CULTURE, Herpes simplex virus culture, mupirocin cream (BACTROBAN) 2 %, valACYclovir (VALTREX) 1000 MG tablet  New-onset vesicular/vascular rash primarily left buttocks to the posterior thigh. Based on initial appearance, concerning for herpes zoster. There is a small patch on the left side thigh as well, which would not fit with dermatomal distribution of shingles. Does have minimal groin appearance of tinea crus, but that is improving with topical treatments. Differential for rash includes  cellulitis/MRSA.  -Wound culture and HSV cultures obtained  -Start Valtrex 1 g 3 times a day, Bactroban topical 3 times a day  -Recheck in 3 days if not improving, or if improving can continue planned follow-up with primary care provider in 6 days.  -RTC precautions discussed if worsening sooner.  Meds ordered this encounter  Medications  .  mupirocin cream (BACTROBAN) 2 %    Sig: Apply 1 application topically 3 (three) times daily.    Dispense:  15 g    Refill:  0  . valACYclovir (VALTREX) 1000 MG tablet    Sig: Take 1 tablet (1,000 mg total) by mouth 3 (three) times daily.    Dispense:  21 tablet    Refill:  0   Patient Instructions   I am suspicious for shingles on the left side, but as there is a small amount of rash on the right side, bacterial infection is also possible. Start Valtrex 1 pill 3 times per day, apply the Bactroban ointment/cream 3 times per day, cleanse area at least once or twice a day with soap and water and dry thoroughly. Recheck in 3 days unless significantly improved, then can follow-up with Dr. [name] as planned next Wednesday.  Return to the clinic or go to the nearest emergency room if any of your symptoms worsen or new symptoms occur.   Rash A rash is a change in the color of the skin. A rash can also change the way your skin feels. There are many different conditions and factors that can cause a rash. Follow these instructions at home: Pay attention to any changes in your symptoms. Follow these instructions to help with your condition: Medicine Take or apply over-the-counter and prescription medicines only as told by your health care provider. These may include:  Corticosteroid cream.  Anti-itch lotions.  Oral antihistamines.  Skin Care  Apply cool compresses to the affected areas.  Try taking a bath with: ? Epsom salts. Follow the instructions on the packaging. You can get these at your local pharmacy or grocery store. ? Baking soda. Pour a  small amount into the bath as told by your health care provider. ? Colloidal oatmeal. Follow the instructions on the packaging. You can get this at your local pharmacy or grocery store.  Try applying baking soda paste to your skin. Stir water into baking soda until it reaches a paste-like consistency.  Do not scratch or rub your skin.  Avoid covering the rash. Make sure the rash is exposed to air as much as possible. General instructions  Avoid hot showers or baths, which can make itching worse. A cold shower may help.  Avoid scented soaps, detergents, and perfumes. Use gentle soaps, detergents, perfumes, and other cosmetic products.  Avoid any substance that causes your rash. Keep a journal to help track what causes your rash. Write down: ? What you eat. ? What cosmetic products you use. ? What you drink. ? What you wear. This includes jewelry.  Keep all follow-up visits as told by your health care provider. This is important. Contact a health care provider if:  You sweat at night.  You lose weight.  You urinate more than normal.  You feel weak.  You vomit.  Your skin or the whites of your eyes look yellow (jaundice).  Your skin: ? Tingles. ? Is numb.  Your rash: ? Does not go away after several days. ? Gets worse.  You are: ? Unusually thirsty. ? More tired than normal.  You have: ? New symptoms. ? Pain in your abdomen. ? A fever. ? Diarrhea. Get help right away if:  You develop a rash that covers all or most of your body. The rash may or may not be painful.  You develop blisters that: ? Are on top of the rash. ? Grow larger or grow together. ? Are  painful. ? Are inside your nose or mouth.  You develop a rash that: ? Looks like purple pinprick-sized spots all over your body. ? Has a "bull's eye" or looks like a target. ? Is not related to sun exposure, is red and painful, and causes your skin to peel. This information is not intended to replace  advice given to you by your health care provider. Make sure you discuss any questions you have with your health care provider. Document Released: 12/29/2001 Document Revised: 06/14/2015 Document Reviewed: 05/26/2014 Elsevier Interactive Patient Education  2017 [place], which is also known as herpes zoster, is an infection that causes a painful skin rash and fluid-filled blisters. Shingles is not related to genital herpes, which is a sexually transmitted infection. Shingles only develops in people who:  Have had chickenpox.  Have received the chickenpox vaccine. (This is rare.)  What are the causes? Shingles is caused by varicella-zoster virus (VZV). This is the same virus that causes chickenpox. After exposure to VZV, the virus stays in the body in an inactive (dormant) state. Shingles develops if the virus reactivates. This can happen many years after the initial exposure to VZV. It is not known what causes this virus to reactivate. What increases the risk? People who have had chickenpox or received the chickenpox vaccine are at risk for shingles. Infection is more common in people who:  Are older than age 37.  Have a weakened defense (immune) system, such as those with HIV, AIDS, or cancer.  Are taking medicines that weaken the immune system, such as transplant medicines.  Are under great stress.  What are the signs or symptoms? Early symptoms of this condition include itching, tingling, and pain in an area on your skin. Pain may be described as burning, stabbing, or throbbing. A few days or weeks after symptoms start, a painful red rash appears, usually on one side of the body in a bandlike or beltlike pattern. The rash eventually turns into fluid-filled blisters that break open, scab over, and dry up in about 2-3 weeks. At any time during the infection, you may also develop:  A fever.  Chills.  A headache.  An upset stomach.  How is this  diagnosed? This condition is diagnosed with a skin exam. Sometimes, skin or fluid samples are taken from the blisters before a diagnosis is made. These samples are examined under a microscope or sent to a lab for testing. How is this treated? There is no specific cure for this condition. Your health care provider will probably prescribe medicines to help you manage pain, recover more quickly, and avoid long-term problems. Medicines may include:  Antiviral drugs.  Anti-inflammatory drugs.  Pain medicines.  If the area involved is on your face, you may be referred to a specialist, such as an eye doctor (ophthalmologist) or an ear, nose, and throat (ENT) doctor to help you avoid eye problems, chronic pain, or disability. Follow these instructions at home: Medicines  Take medicines only as directed by your health care provider.  Apply an anti-itch or numbing cream to the affected area as directed by your health care provider. Blister and Rash Care  Take a cool bath or apply cool compresses to the area of the rash or blisters as directed by your health care provider. This may help with pain and itching.  Keep your rash covered with a loose bandage (dressing). Wear loose-fitting clothing to help ease the pain of material rubbing against the rash.  Keep your rash and blisters clean with mild soap and cool water or as directed by your health care provider.  Check your rash every day for signs of infection. These include redness, swelling, and pain that lasts or increases.  Do not pick your blisters.  Do not scratch your rash. General instructions  Rest as directed by your health care provider.  Keep all follow-up visits as directed by your health care provider. This is important.  Until your blisters scab over, your infection can cause chickenpox in people who have never had it or been vaccinated against it. To prevent this from happening, avoid contact with other people,  especially: ? Babies. ? Pregnant women. ? Children who have eczema. ? Elderly people who have transplants. ? People who have chronic illnesses, such as leukemia or AIDS. Contact a health care provider if:  Your pain is not relieved with prescribed medicines.  Your pain does not get better after the rash heals.  Your rash looks infected. Signs of infection include redness, swelling, and pain that lasts or increases. Get help right away if:  The rash is on your face or nose.  You have facial pain, pain around your eye area, or loss of feeling on one side of your face.  You have ear pain or you have ringing in your ear.  You have loss of taste.  Your condition gets worse. This information is not intended to replace advice given to you by your health care provider. Make sure you discuss any questions you have with your health care provider. Document Released: 01/08/2005 Document Revised: 09/04/2015 Document Reviewed: 11/19/2013 Elsevier Interactive Patient Education  2017 [employer].     IF you received an x-ray today, you will receive an invoice from [facility]. Please contact [facility] at [phone] with questions or concerns regarding your invoice.   IF you received labwork today, you will receive an invoice from [facility]. Please contact [facility] at [phone] with questions or concerns regarding your invoice.   Our billing staff will not be able to assist you with questions regarding bills from these companies.  You will be contacted with the lab results as soon as they are available. The fastest way to get your results is to activate your My Chart account. Instructions are located on the last page of this paperwork. If you have not heard from [place] regarding the results in 2 weeks, please contact this office.       I personally performed the services described in this documentation, which was scribed in my presence. The recorded information has  been reviewed and considered for accuracy and completeness, addended by me as needed, and agree with information above.  Signed,   Merri Ray, MD Primary [facility] at [place].  10/19/16 2:30 PM

## 2016-10-21 NOTE — Progress Notes (Signed)
Cardiology Office Note:    Date:  10/22/2016   ID:  [name], DOB [birth year], MRN [id number]  PCP:  [name], MD  Cardiologist:  Dr. Nelva Bush    Referring MD: [name], MD   Chief Complaint  Patient presents with  . Hospitalization Follow-up    NSTEMI    History of Present Illness:    Andrew [name] is a 83 y.o. male with a hx of CAD status post remote CABG in 1991, moderate aortic stenosis, chronic diastolic heart failure, chronic kidney disease, and stroke.  He has stable angina.  Last seen by Dr. Harrell [name] End 10/01/16.  He was started on antibiotics for community-acquired pneumonia.    He was admitted 9/18-9/23 with NSTEMI and acute systolic HF.  His Troponin peaked at 34.  An echocardiogram demonstrated EF 40-45.  He was diuresed and this was complicated by AKI.  He was followed by Cardiology and medical Rx was pursued.  The patient is DNR.    Andrew [name] returns for post hospitalization follow up.  He is here with his wife and daughter who help with the history.  Andrew [name] has difficulty remembering things.  He has not had any chest pain.  He sleeps on 2 pillows but has not had paroxysmal nocturnal dyspnea or noticed any edema.  He has a non-productive cough. He is short of breath with certain activities.  He does get dizzy sometimes when he stands.    Prior CV studies:   The following studies were reviewed today:  Echo 10/11/16 Mod conc LVH, EF 40-45, no RWMA, Gr 2 DD, mod AI, AV gradient mean 18/peak 30, mild dilated ascending aorta (40 mm), MAC, mild MR, mod LAE, PASP 65  Echo 06/21/16 Mild LVH, EF 55-60, mild AI, mod AS (mean 22, peak 39), mild MR, mild LAE, mod PI, PASP 36  Carotid US 05/28/16 R < 40; L 60-79  Cardiac Catheterization 08/2000  S-RCA patent L-LAD patent S-LCx w/ prox limb occluded; distal limb patent Med Rx  Past Medical History:  Diagnosis Date  . Allergic rhinitis   . AORTIC STENOSIS   . CAD, ARTERY BYPASS GRAFT   . CAROTID  ARTERY STENOSIS 03/07/2010   80%  . CHF (congestive heart failure) ([place])   . COPD, mild ([place]) 12/22/2010  . GASTROESOPHAGEAL REFLUX DISEASE   . History of colonic polyps    1999, 2004  . History of prostate cancer 2002   s/p treatment with seeds / radiation  . HYPERCHOLESTEROLEMIA   . Lumbar spinal stenosis 07/13/2009  . Macular degeneration (senile) of retina 04/29/2014  . Pancreatic cyst 01/29/2013   Noted on [facility] scan [facility] on 07/27/2011.  I reviewed report at patient request on 01-29-2013.  "Small cystic focus in the posterior pancreatic head.  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  Follow up MRI is advised. The main pancreatic duct remains normal in appearance."  Patient do  . S/P excision of acoustic neuroma   . Skin cancer   . Stroke ([place])   . Thyroid nodule 05/2010   Abnormal biopsy, 83 year old patient and his wife have made informed decision to not pursue surgical resection. Potential risk including cancer has been thoroughly discussed with the patient.    Past Surgical History:  Procedure Laterality Date  . CATARACT EXTRACTION    . CATARACT EXTRACTION  2010  . CHOLECYSTECTOMY    .  CORONARY ARTERY BYPASS GRAFT    . CRANIECTOMY FOR EXCISION OF ACOUSTIC NEUROMA  1985  . EYE SURGERY    . SHOULDER SURGERY    . TONSILLECTOMY AND ADENOIDECTOMY  1932  . TOTAL KNEE ARTHROPLASTY    . transperineal implatation of palladium      Current Medications: Current Meds  Medication Sig  . acetaminophen (TYLENOL) 500 MG tablet Take 500 mg by mouth every 6 (six) hours as needed (pain).  Marland Kitchen albuterol (PROVENTIL HFA;VENTOLIN HFA) 108 (90 Base) MCG/ACT inhaler Inhale 2 puffs into the lungs every 6 (six) hours as needed for wheezing or shortness of breath.  . B Complex-C-Folic Acid (NEPHRO-VITE PO) Take 1 tablet by mouth daily.    . cholecalciferol (VITAMIN D) 1000 units tablet  Take 1,000 Units by mouth daily.  . clopidogrel (PLAVIX) 75 MG tablet Take 1 tablet (75 mg total) by mouth daily.  [name] 10 MG tablet Take 1 tablet (10 mg total) by mouth daily.  . metoprolol succinate (TOPROL-XL) 50 MG 24 hr tablet Take 1 tablet (50 mg total) by mouth daily. Take with or immediately following a meal.  . Multiple Vitamins-Minerals (PRESERVISION/LUTEIN) CAPS Take 1 capsule by mouth 2 (two) times daily.  . mupirocin cream (BACTROBAN) 2 % Apply 1 application topically 3 (three) times daily.  . nitroGLYCERIN (NITROSTAT) 0.4 MG SL tablet Place 0.4 mg under the tongue every 5 (five) minutes as needed for chest pain (Max 3 doses with 15 minutes call 911).  Marland Kitchen OVER THE COUNTER MEDICATION Place 1 drop into both eyes daily as needed (dry eyes). OTC lubricating eye drop  . potassium chloride (K-DUR,KLOR-CON) 10 MEQ tablet TAKE TWO TABLETS BY MOUTH DAILY   . valACYclovir (VALTREX) 1000 MG tablet Take 1 tablet (1,000 mg total) by mouth 3 (three) times daily.  . [DISCONTINUED] torsemide (DEMADEX) 20 MG tablet Take 2 tablets (40 mg total) by mouth daily.     Allergies:   Patient has no known allergies.   Social History   Social History  . Marital status: Married    Spouse name: N/A  . Number of children: N/A  . Years of education: N/A   Occupational History  . retired  At [employer]    and [employer] Topics  . Smoking status: Former Smoker    Packs/day: 2.00    Years: 20.00    Types: Cigarettes    Quit date: 01/22/1961  . Smokeless tobacco: Never Used  . Alcohol use 4.2 oz/week    7 Shots of liquor per week     Comment: 1 drink nightly  . Drug use: No  . Sexual activity: No   Other Topics Concern  . None   Social History Narrative  . None     Family Hx: The patient's family history includes Alcohol abuse in his unknown relative; Arthritis in his unknown relative; Cancer in his unknown relative; Colon cancer in his sister and  sister; Emphysema in his brother; Heart attack in his father; Heart disease in his brother and father; Hypertension in his father; Lung cancer in his brother and daughter; Macular degeneration in his unknown relative; Non-Hodgkin's lymphoma in his sister; Skin cancer in his sister.  ROS:   Please see the history of present illness.    Review of Systems  Constitution: Positive for chills.  Cardiovascular: Positive for chest pain.  Respiratory: Positive for cough and shortness of breath.   Skin: Positive for rash.  All other systems reviewed and are negative.   EKGs/Labs/Other Test Reviewed:    EKG:  EKG is  ordered today.  The ekg ordered today demonstrates Sinus bradycardia, HR 59, left axis deviation, IVCD, QTc 459 ms, PVC  Recent Labs: 10/09/2016: ALT 25; B Natriuretic Peptide 478.7 10/14/2016: Hemoglobin 14.1; Magnesium 2.4; Platelets 141 10/22/2016: BUN 37; Creatinine, Ser 1.56; Potassium 4.5; Sodium 140   Recent Lipid Panel Lab Results  Component Value Date/Time   CHOL 105 03/29/2016 09:36 AM   TRIG 97.0 03/29/2016 09:36 AM   HDL 40.20 03/29/2016 09:36 AM   CHOLHDL 3 03/29/2016 09:36 AM   LDLCALC 46 03/29/2016 09:36 AM   LDLDIRECT 67.0 03/13/2010 02:53 PM    Physical Exam:    VS:  BP (!) 100/52   Pulse 60   Ht 5' 6"  (1.676 m)   Wt 177 lb 12.8 oz (80.6 kg)   BMI 28.70 kg/m     Wt Readings from Last 3 Encounters:  10/22/16 177 lb 9.6 oz (80.6 kg)  10/22/16 177 lb 12.8 oz (80.6 kg)  10/19/16 173 lb (78.5 kg)     Physical Exam  Constitutional: He is oriented to person, place, and time. He appears well-developed and well-nourished. No distress.  HENT:  Head: Normocephalic and atraumatic.  Eyes: No scleral icterus.  Neck: No JVD (at 90 degrees) present.  Cardiovascular: Normal rate and regular rhythm.   Murmur heard.  Harsh systolic murmur is present with a grade of 2/6  at the upper right sternal border Pulmonary/Chest: Effort normal. He has no rales.    Abdominal: Soft. There is no tenderness.  Musculoskeletal: He exhibits no edema.  Neurological: He is alert and oriented to person, place, and time.  Skin: Skin is warm and dry.  Psychiatric: He has a normal mood and affect.    ASSESSMENT:    1. NSTEMI (non-ST elevated myocardial infarction) ([place])   2. Chronic systolic heart failure ([place])   3. Coronary artery disease of native artery of native heart with stable angina pectoris ([place])   4. Essential hypertension   5. Chronic renal insufficiency, stage III (moderate)   6. Moderate aortic stenosis    PLAN:    In order of problems listed above:  1. NSTEMI (non-ST elevated myocardial infarction) ([place]) Recent admission with NSTEMI and peak Troponin 34.  He is treated medically.  He and his family did not want to pursue invasive evaluation.  He is not having any angina.  Continue Plavix, beta-blocker, nitrates, statin.   2. Chronic systolic heart failure (HCC) LVF reduced now with EF 40-45.  He is somewhat weak. His blood pressure is running somewhat low now.  His dose of beta-blocker was increased and his diuretic was changed from Furosemide to Torsemide.  His volume seems stable.  But, he remains NYHA 3.  His dyspnea is likely multifactorial and related to CHF, CAD, deconditioning. I think we can cut back on his diuretic some.  -  Continue current dose of nitrates, beta-blocker.  -  Decrease Torsemide to 40 mg every Mon, Wed, Fri and take 20 mg all other days.  -  BMET today  3. Coronary artery disease of native artery of native heart with stable angina pectoris ([place]) Hx of CABG in 1991.  Recent NSTEMI treated medically.  Continue Plavix, statin, nitrates, beta-blocker.  4. Essential hypertension BP running on the low side.  I will cut back on the Torsemide as noted above.  We may need to reduce the dose  of beta-blocker if his blood pressure remains low.  Of note, the beta-blocker was increased due to tachycardia in the hospital.  His  rate increased mainly due to stress/panic related to urinary retention.  5. Chronic renal insufficiency, stage III (moderate) Repeat BMET today.  6. Aortic valve stenosis, etiology of cardiac valve disease unspecified Mod by recent Echo.    Dispo:  Return in about 4 weeks (around 11/19/2016) for Close Follow Up, w/ Dr. Saunders Revel.   Medication Adjustments/Labs and Tests Ordered: Current medicines are reviewed at length with the patient today.  Concerns regarding medicines are outlined above.  Tests Ordered: Orders Placed This Encounter  Procedures  . Basic Metabolic Panel (BMET)  . EKG 12-Lead   Medication Changes: Meds ordered this encounter  Medications  . torsemide (DEMADEX) 20 MG tablet    Sig: Take 2 tablets (40 mg total) by mouth as directed. Take 40 mg M, W, and Fri's. All other days take 20 mg daily    Dispense:  180 tablet    Refill:  3  . pravastatin (PRAVACHOL) 40 MG tablet    Sig: Take 1 tablet (40 mg total) by mouth at bedtime.    Dispense:  90 tablet    Refill:  3  . isosorbide mononitrate (IMDUR) 120 MG 24 hr tablet    Sig: Take 1 tablet (120 mg total) by mouth at bedtime.    Dispense:  90 tablet    Refill:  3    Signed, Richardson Dopp, PA-C  10/22/2016 9:25 PM    [facility] [address], [place], [place]  [address] Phone: [phone]; Fax: [phone]

## 2016-10-22 ENCOUNTER — Ambulatory Visit (INDEPENDENT_AMBULATORY_CARE_PROVIDER_SITE_OTHER): Payer: Medicare Other | Admitting: Physician Assistant

## 2016-10-22 ENCOUNTER — Ambulatory Visit (INDEPENDENT_AMBULATORY_CARE_PROVIDER_SITE_OTHER): Payer: Medicare Other | Admitting: Family Medicine

## 2016-10-22 ENCOUNTER — Encounter: Payer: Self-pay | Admitting: Family Medicine

## 2016-10-22 ENCOUNTER — Encounter: Payer: Self-pay | Admitting: Physician Assistant

## 2016-10-22 VITALS — BP 100/52 | HR 60 | Ht 66.0 in | Wt 177.8 lb

## 2016-10-22 VITALS — BP 100/52 | HR 53 | Temp 98.5°F | Resp 16 | Ht 66.0 in | Wt 177.6 lb

## 2016-10-22 DIAGNOSIS — L02419 Cutaneous abscess of limb, unspecified: Secondary | ICD-10-CM

## 2016-10-22 DIAGNOSIS — L03119 Cellulitis of unspecified part of limb: Secondary | ICD-10-CM

## 2016-10-22 DIAGNOSIS — I214 Non-ST elevation (NSTEMI) myocardial infarction: Secondary | ICD-10-CM | POA: Diagnosis not present

## 2016-10-22 DIAGNOSIS — N183 Chronic kidney disease, stage 3 unspecified: Secondary | ICD-10-CM

## 2016-10-22 DIAGNOSIS — R21 Rash and other nonspecific skin eruption: Secondary | ICD-10-CM

## 2016-10-22 DIAGNOSIS — I10 Essential (primary) hypertension: Secondary | ICD-10-CM | POA: Diagnosis not present

## 2016-10-22 DIAGNOSIS — I350 Nonrheumatic aortic (valve) stenosis: Secondary | ICD-10-CM

## 2016-10-22 DIAGNOSIS — I25118 Atherosclerotic heart disease of native coronary artery with other forms of angina pectoris: Secondary | ICD-10-CM | POA: Diagnosis not present

## 2016-10-22 DIAGNOSIS — I5022 Chronic systolic (congestive) heart failure: Secondary | ICD-10-CM | POA: Diagnosis not present

## 2016-10-22 LAB — BASIC METABOLIC PANEL
BUN/Creatinine Ratio: 24 (ref 10–24)
BUN: 37 mg/dL — ABNORMAL HIGH (ref 10–36)
CO2: 27 mmol/L (ref 20–29)
CREATININE: 1.56 mg/dL — AB (ref 0.76–1.27)
Calcium: 8.9 mg/dL (ref 8.6–10.2)
Chloride: 102 mmol/L (ref 96–106)
GFR calc Af Amer: 44 mL/min/{1.73_m2} — ABNORMAL LOW (ref >59)
GFR, EST NON AFRICAN AMERICAN: 38 mL/min/{1.73_m2} — AB (ref >59)
GLUCOSE: 128 mg/dL — AB (ref 65–99)
Potassium: 4.5 mmol/L (ref 3.5–5.2)
SODIUM: 140 mmol/L (ref 134–144)

## 2016-10-22 LAB — HERPES SIMPLEX VIRUS CULTURE

## 2016-10-22 LAB — WOUND CULTURE: Organism ID, Bacteria: NONE SEEN

## 2016-10-22 LAB — POCT SKIN KOH: Skin KOH, POC: NEGATIVE

## 2016-10-22 MED ORDER — TORSEMIDE 20 MG PO TABS: 40.0000 mg | ORAL_TABLET | ORAL | 3 refills | Status: DC

## 2016-10-22 MED ORDER — PRAVASTATIN SODIUM 40 MG PO TABS: 40.0000 mg | ORAL_TABLET | Freq: Every day | ORAL | 3 refills | Status: DC

## 2016-10-22 MED ORDER — ISOSORBIDE MONONITRATE ER 120 MG PO TB24: 120.0000 mg | ORAL_TABLET | Freq: Every day | ORAL | 3 refills | Status: AC

## 2016-10-22 MED ORDER — DOXYCYCLINE HYCLATE 100 MG PO TABS: 100.0000 mg | ORAL_TABLET | Freq: Two times a day (BID) | ORAL | 0 refills | Status: DC

## 2016-10-22 MED ORDER — CLOTRIMAZOLE 1 % EX CREA: 1.0000 "application " | TOPICAL_CREAM | Freq: Two times a day (BID) | CUTANEOUS | 0 refills | Status: DC

## 2016-10-22 NOTE — Patient Instructions (Signed)
Medication Instructions:  1. CHANGE TORSEMIDE TO 40 MG EVERY Monday, WED, AND FRIDAYS, ALL OTHER DAYS TAKE 20 MG DAILY  2. OK TO CHANGE PRAVASTATIN AND IMDUR TO TAKE IN THE EVENING   Labwork: 1. BMET TODAY  Testing/Procedures: NONE ORDERED TODAY  Follow-Up: DR. END IN 3-4 WEEKS  Any Other Special Instructions Will Be Listed Below (If Applicable).     If you need a refill on your cardiac medications before your next appointment, please call your pharmacy.

## 2016-10-22 NOTE — Patient Instructions (Addendum)
I checked another wound culture today. Can start doxycycline to cover for possible bacterial cause of skin rash. Ok to apply the new antifungal cream twice per day to area between buttocks and other areas as needed and follow up with Dr. [name] as planned on Wednesday. If any fevers, spread of rash to other areas of your body, or other worsening - return sooner.    Rash A rash is a change in the color of the skin. A rash can also change the way your skin feels. There are many different conditions and factors that can cause a rash. Follow these instructions at home: Pay attention to any changes in your symptoms. Follow these instructions to help with your condition: Medicine Take or apply over-the-counter and prescription medicines only as told by your health care provider. These may include:  Corticosteroid cream.  Anti-itch lotions.  Oral antihistamines.  Skin Care  Apply cool compresses to the affected areas.  Try taking a bath with: ? Epsom salts. Follow the instructions on the packaging. You can get these at your local pharmacy or grocery store. ? Baking soda. Pour a small amount into the bath as told by your health care provider. ? Colloidal oatmeal. Follow the instructions on the packaging. You can get this at your local pharmacy or grocery store.  Try applying baking soda paste to your skin. Stir water into baking soda until it reaches a paste-like consistency.  Do not scratch or rub your skin.  Avoid covering the rash. Make sure the rash is exposed to air as much as possible. General instructions  Avoid hot showers or baths, which can make itching worse. A cold shower may help.  Avoid scented soaps, detergents, and perfumes. Use gentle soaps, detergents, perfumes, and other cosmetic products.  Avoid any substance that causes your rash. Keep a journal to help track what causes your rash. Write down: ? What you eat. ? What cosmetic products you use. ? What you  drink. ? What you wear. This includes jewelry.  Keep all follow-up visits as told by your health care provider. This is important. Contact a health care provider if:  You sweat at night.  You lose weight.  You urinate more than normal.  You feel weak.  You vomit.  Your skin or the whites of your eyes look yellow (jaundice).  Your skin: ? Tingles. ? Is numb.  Your rash: ? Does not go away after several days. ? Gets worse.  You are: ? Unusually thirsty. ? More tired than normal.  You have: ? New symptoms. ? Pain in your abdomen. ? A fever. ? Diarrhea. Get help right away if:  You develop a rash that covers all or most of your body. The rash may or may not be painful.  You develop blisters that: ? Are on top of the rash. ? Grow larger or grow together. ? Are painful. ? Are inside your nose or mouth.  You develop a rash that: ? Looks like purple pinprick-sized spots all over your body. ? Has a "bull's eye" or looks like a target. ? Is not related to sun exposure, is red and painful, and causes your skin to peel. This information is not intended to replace advice given to you by your health care provider. Make sure you discuss any questions you have with your health care provider. Document Released: 12/29/2001 Document Revised: 06/14/2015 Document Reviewed: 05/26/2014 Elsevier Interactive Patient Education  2017 [employer].     IF you received  an x-ray today, you will receive an invoice from [facility]. Please contact [facility] at [phone] with questions or concerns regarding your invoice.   IF you received labwork today, you will receive an invoice from [facility]. Please contact [facility] at [phone] with questions or concerns regarding your invoice.   Our billing staff will not be able to assist you with questions regarding bills from these companies.  You will be contacted with the lab results as soon as they are available. The  fastest way to get your results is to activate your My Chart account. Instructions are located on the last page of this paperwork. If you have not heard from [place] regarding the results in 2 weeks, please contact this office.

## 2016-10-22 NOTE — Progress Notes (Addendum)
Subjective:  By signing my name below, I, Andrew [name], attest that this documentation has been prepared under the direction and in the presence of [name], MD Electronically Signed: [name], ED Scribe 10/22/2016 at 4:02 PM.   Patient ID: Andrew [name], male    DOB: [birth year], 83 y.o.   MRN: [id number]  Chief Complaint  Patient presents with  . Follow-up    Andrew [name] looks like it has gotten bigger around groin and back of legs    HPI Andrew [name] is a 83 y.o. male who presents to Primary Care at [facility] for follow-up of rash. He was seen on 9/28 after recent hospital discharge. PCP [name], Andrew Hamman, MD. He noticed a rash on L buttock the night prior. Possible zoster based on initial appearance but did have areas with involvement of the R upper thigh. Treated with valtrex 1000 mg, Bactroban topical to cover for possible cellulitis/MRSA and continued cream he used in the hospital for possible tinea puris. He has planned f/u with PCP in 2 days. HSV culture was negative. Wound culture with mixed skin flora only.   Pt's wife states that rash has worsened since last OV and she has noticed more blisters underneath both buttocks. Wife states the moisture barrier cream seemed to improve the rash to the groin.   Patient Active Problem List   Diagnosis Date Noted  . Chronic systolic heart failure ([place])   . CAP (community acquired pneumonia) 10/09/2016  . NSTEMI (non-ST elevated myocardial infarction) ([place]) 10/09/2016  . Chest pain   . Cough 10/01/2016  . Chills 10/01/2016  . History of stroke 10/01/2016  . Coronary artery disease of native artery of native heart with stable angina pectoris ([place]) 06/28/2016  . Bilateral carotid artery stenosis 06/28/2016  . Hyperlipidemia LDL goal <70 06/28/2016  . Advanced Planning: DNR 04/09/2016  . Essential hypertension   . Alcohol abuse   . Thrombocytopenia ([place])   . Right-sided cerebrovascular accident (CVA) ([place])   . Chronic renal  insufficiency, stage III (moderate) ([place]) 06/02/2014  . Macular degeneration (senile) of retina 04/29/2014  . Major depressive disorder, single episode 06/11/2013  . TIA (transient ischemic attack) 06/03/2013  . Pancreatic cyst 01/29/2013  . OSA (obstructive sleep apnea) 12/26/2012  . Tachycardia-bradycardia syndrome ([place]) 08/19/2012  . COPD, minimal-mild 12/22/2010  . S/P excision of acoustic neuroma 06/18/2010  . Pulmonary nodule 06/17/2010  . Vertigo 06/14/2010  . THYROID NODULE 03/13/2010  . PVD- moderate carotid disease 03/07/2010  . Spinal stenosis, lumbar region, with neurogenic claudication 07/13/2009  . ALLERGIC RHINITIS 09/01/2008  . COLONIC POLYPS, HX OF 09/01/2008  . HYPERCHOLESTEROLEMIA 06/18/2008  . Angina, class II ([place]) 06/18/2008  . Hx of CABG 06/18/2008  . Moderate aortic stenosis 06/18/2008  . GASTROESOPHAGEAL REFLUX DISEASE 06/18/2008   Past Medical History:  Diagnosis Date  . Allergic rhinitis   . AORTIC STENOSIS   . CAD, ARTERY BYPASS GRAFT   . CAROTID ARTERY STENOSIS 03/07/2010   80%  . CHF (congestive heart failure) ([place])   . COPD, mild ([place]) 12/22/2010  . GASTROESOPHAGEAL REFLUX DISEASE   . History of colonic polyps    1999, 2004  . History of prostate cancer 2002   s/p treatment with seeds / radiation  . HYPERCHOLESTEROLEMIA   . Lumbar spinal stenosis 07/13/2009  . Macular degeneration (senile) of retina 04/29/2014  . Pancreatic cyst 01/29/2013   Noted on MRI scan from [facility] on 07/27/2011.  I reviewed report at  patient request on 01-29-2013.  "Small cystic focus in the posterior pancreatic head.  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  Follow up MRI is advised. The main pancreatic duct remains normal in appearance."  Patient do  . S/P excision of acoustic neuroma   . Skin cancer   . Stroke ([place])   . Thyroid nodule 05/2010   Abnormal biopsy,  83 year old patient and his wife have made informed decision to not pursue surgical resection. Potential risk including cancer has been thoroughly discussed with the patient.   Past Surgical History:  Procedure Laterality Date  . CATARACT EXTRACTION    . CATARACT EXTRACTION  2010  . CHOLECYSTECTOMY    . CORONARY ARTERY BYPASS GRAFT    . CRANIECTOMY FOR EXCISION OF ACOUSTIC NEUROMA  1985  . EYE SURGERY    . SHOULDER SURGERY    . TONSILLECTOMY AND ADENOIDECTOMY  1932  . TOTAL KNEE ARTHROPLASTY    . transperineal implatation of palladium     No Known Allergies Prior to Admission medications   Medication Sig Start Date End Date Taking? Authorizing Provider  acetaminophen (TYLENOL) 500 MG tablet Take 500 mg by mouth every 6 (six) hours as needed (pain).    [provider]  albuterol (PROVENTIL HFA;VENTOLIN HFA) 108 (90 Base) MCG/ACT inhaler Inhale 2 puffs into the lungs every 6 (six) hours as needed for wheezing or shortness of breath. 08/06/16   [name], Andrew Hamman, MD  B Complex-C-Folic Acid (NEPHRO-VITE PO) Take 1 tablet by mouth daily.      [provider]  cholecalciferol (VITAMIN D) 1000 units tablet Take 1,000 Units by mouth daily.    [provider]  clopidogrel (PLAVIX) 75 MG tablet Take 1 tablet (75 mg total) by mouth daily. 06/28/16   End, Harrell Gave, MD  FLUoxetine (PROZAC) 10 MG tablet Take 1 tablet (10 mg total) by mouth daily. 10/08/16   [name], Andrew Hamman, MD  isosorbide mononitrate (IMDUR) 120 MG 24 hr tablet Take 1 tablet (120 mg total) by mouth at bedtime. 10/22/16   Richardson Dopp T, PA-C  metoprolol succinate (TOPROL-XL) 50 MG 24 hr tablet Take 1 tablet (50 mg total) by mouth daily. Take with or immediately following a meal. 10/15/16   Short, Noah Delaine, MD  Multiple Vitamins-Minerals (PRESERVISION/LUTEIN) CAPS Take 1 capsule by mouth 2 (two) times daily.    [provider]  mupirocin cream (BACTROBAN) 2 % Apply 1 application topically 3 (three)  times daily. 10/19/16   [name], MD  nitroGLYCERIN (NITROSTAT) 0.4 MG SL tablet Place 0.4 mg under the tongue every 5 (five) minutes as needed for chest pain (Max 3 doses with 15 minutes call 911).    [provider]  OVER THE COUNTER MEDICATION Place 1 drop into both eyes daily as needed (dry eyes). OTC lubricating eye drop    [provider]  potassium chloride (K-DUR,KLOR-CON) 10 MEQ tablet TAKE TWO TABLETS BY MOUTH DAILY  08/27/16   End, Harrell Gave, MD  pravastatin (PRAVACHOL) 40 MG tablet Take 1 tablet (40 mg total) by mouth at bedtime. 10/22/16   Richardson Dopp T, PA-C  torsemide (DEMADEX) 20 MG tablet Take 2 tablets (40 mg total) by mouth as directed. Take 40 mg M, W, and Fri's. All other days take 20 mg daily 10/22/16 01/20/17  Richardson Dopp T, PA-C  valACYclovir (VALTREX) 1000 MG tablet Take 1 tablet (1,000 mg total) by mouth 3 (three) times daily.  10/19/16   [name], MD   Social History   Social History  . Marital status: Married    Spouse name: N/A  . Number of children: N/A  . Years of education: N/A   Occupational History  . retired  At [employer]    and [employer]  . Smoking status: Former Smoker    Packs/day: 2.00    Years: 20.00    Types: Cigarettes    Quit date: 01/22/1961  . Smokeless tobacco: Never Used  . Alcohol use 4.2 oz/week    7 Shots of liquor per week     Comment: 1 drink nightly  . Drug use: No  . Sexual activity: No   Other Topics Concern  . Not on file   Social History Narrative  . No narrative on file   Review of Systems  Skin: Positive for rash.      Objective:   Physical Exam  Constitutional: He is oriented to person, place, and time. He appears well-developed and well-nourished. No distress.  HENT:  Head: Normocephalic and atraumatic.  Eyes: Conjunctivae and EOM are normal.  Neck: Neck supple. No tracheal deviation present.  Cardiovascular: Normal rate.   Pulmonary/Chest:  Effort normal. No respiratory distress.  Musculoskeletal: Normal range of motion.  Neurological: He is alert and oriented to person, place, and time.  Skin: Skin is warm and dry. Rash noted.  Some flaking of the skin at superior anal cleft with surrounding erythematous round patches. Larger patches with slight central pallor, more erythematous border at the L buttocks. Vesicular appearing patches down bilateral medial to posterior thigh proximally that end at the hyperpigmentation of the inguinal folds. Few other smaller satellite area lower on the thighs.  Psychiatric: He has a normal mood and affect. His behavior is normal.  Nursing note and vitals reviewed.  Vitals:   10/22/16 1540  BP: (!) 100/52  Pulse: (!) 53  Resp: 16  Temp: 98.5 F (36.9 C)  SpO2: 96%  Weight: 177 lb 9.6 oz (80.6 kg)  Height: 5\' 6"  (1.676 m)   Results for orders placed or performed in visit on 10/22/16  POCT Skin KOH  Result Value Ref Range   Skin KOH, POC Negative Negative       Assessment & Plan:    JACIEL DIEM is a 83 y.o. male Rash and nonspecific skin eruption - Plan: POCT Skin KOH, doxycycline (VIBRA-TABS) 100 MG tablet, WOUND CULTURE, clotrimazole (LOTRIMIN) 1 % cream  Cellulitis and abscess of leg - Plan: doxycycline (VIBRA-TABS) 100 MG tablet, WOUND CULTURE  Various patches around buttocks and upper thighs. Seem to be slightly increased from last visit, but no systemic symptoms, no systemic rash.  Less likely drug reaction. Bilateral symptoms, and negative zoster culture, less likely zoster. Does have some appearance in the groin of possible tinea versus yeast in inguinal folds but scraping was negative of affected patches. Unroofed one area without apparent exudate, but repeat culture obtained. Differential includes fungal versus bacterial versus skin breakdown.  - Repeat wound culture. Start doxycycline to cover for possible MRSA with recent hospitalization.   - can try clotrimazole topical  twice a day to affected areas in case this is fungal  - With bilateral symptoms and negative HSV culture, potentially could stop Valtrex at this time.  - Follow-up with his primary care provider as planned.  Consider biopsy versus dermatology eval if persistent symptoms or not improving.  Meds ordered this  encounter  Medications  . doxycycline (VIBRA-TABS) 100 MG tablet    Sig: Take 1 tablet (100 mg total) by mouth 2 (two) times daily.    Dispense:  20 tablet    Refill:  0  . clotrimazole (LOTRIMIN) 1 % cream    Sig: Apply 1 application topically 2 (two) times daily.    Dispense:  30 g    Refill:  0   Patient Instructions   I checked another wound culture today. Can start doxycycline to cover for possible bacterial cause of skin rash. Ok to apply the new antifungal cream twice per day to area between buttocks and other areas as needed and follow up with Dr. [name] as planned on Wednesday. If any fevers, spread of rash to other areas of your body, or other worsening - return sooner.    Rash A rash is a change in the color of the skin. A rash can also change the way your skin feels. There are many different conditions and factors that can cause a rash. Follow these instructions at home: Pay attention to any changes in your symptoms. Follow these instructions to help with your condition: Medicine Take or apply over-the-counter and prescription medicines only as told by your health care provider. These may include:  Corticosteroid cream.  Anti-itch lotions.  Oral antihistamines.  Skin Care  Apply cool compresses to the affected areas.  Try taking a bath with: ? Epsom salts. Follow the instructions on the packaging. You can get these at your local pharmacy or grocery store. ? Baking soda. Pour a small amount into the bath as told by your health care provider. ? Colloidal oatmeal. Follow the instructions on the packaging. You can get this at your local pharmacy or grocery  store.  Try applying baking soda paste to your skin. Stir water into baking soda until it reaches a paste-like consistency.  Do not scratch or rub your skin.  Avoid covering the rash. Make sure the rash is exposed to air as much as possible. General instructions  Avoid hot showers or baths, which can make itching worse. A cold shower may help.  Avoid scented soaps, detergents, and perfumes. Use gentle soaps, detergents, perfumes, and other cosmetic products.  Avoid any substance that causes your rash. Keep a journal to help track what causes your rash. Write down: ? What you eat. ? What cosmetic products you use. ? What you drink. ? What you wear. This includes jewelry.  Keep all follow-up visits as told by your health care provider. This is important. Contact a health care provider if:  You sweat at night.  You lose weight.  You urinate more than normal.  You feel weak.  You vomit.  Your skin or the whites of your eyes look yellow (jaundice).  Your skin: ? Tingles. ? Is numb.  Your rash: ? Does not go away after several days. ? Gets worse.  You are: ? Unusually thirsty. ? More tired than normal.  You have: ? New symptoms. ? Pain in your abdomen. ? A fever. ? Diarrhea. Get help right away if:  You develop a rash that covers all or most of your body. The rash may or may not be painful.  You develop blisters that: ? Are on top of the rash. ? Grow larger or grow together. ? Are painful. ? Are inside your nose or mouth.  You develop a rash that: ? Looks like purple pinprick-sized spots all over your body. ? Has a "  bull's eye" or looks like a target. ? Is not related to sun exposure, is red and painful, and causes your skin to peel. This information is not intended to replace advice given to you by your health care provider. Make sure you discuss any questions you have with your health care provider. Document Released: 12/29/2001 Document Revised:  06/14/2015 Document Reviewed: 05/26/2014 Elsevier Interactive Patient Education  2017 [employer].     IF you received an x-ray today, you will receive an invoice from [facility]. Please contact [facility] at [phone] with questions or concerns regarding your invoice.   IF you received labwork today, you will receive an invoice from [facility]. Please contact [facility] at [phone] with questions or concerns regarding your invoice.   Our billing staff will not be able to assist you with questions regarding bills from these companies.  You will be contacted with the lab results as soon as they are available. The fastest way to get your results is to activate your My Chart account. Instructions are located on the last page of this paperwork. If you have not heard from [place] regarding the results in 2 weeks, please contact this office.       I personally performed the services described in this documentation, which was scribed in my presence. The recorded information has been reviewed and considered for accuracy and completeness, addended by me as needed, and agree with information above.  Signed,   Merri Ray, MD Primary Care at [facility].  10/24/16 1:07 PM

## 2016-10-23 ENCOUNTER — Telehealth: Payer: Self-pay | Admitting: *Deleted

## 2016-10-23 DIAGNOSIS — I5022 Chronic systolic (congestive) heart failure: Secondary | ICD-10-CM

## 2016-10-23 NOTE — Addendum Note (Signed)
Addended by: Michae Kava on: 10/23/2016 09:46 AM   Modules accepted: Orders

## 2016-10-23 NOTE — Telephone Encounter (Signed)
-----   Message from Liliane Shi, [place] sent at 10/22/2016  9:58 PM EDT ----- Please call the patient Kidney function is stable. The potassium is normal. Continue with current medications and follow up as planned. Repeat BMET 4 weeks.  Richardson Dopp, PA-C    10/22/2016 9:57 PM

## 2016-10-23 NOTE — Telephone Encounter (Signed)
DPR ok to s/w pt's daughter Chanteyl Schneeberger who has been notified of pt's lab results by phone with verbal understanding. Daughter aware to continue on current Tx plan as discussed at [place] yesterday. BMET to be done 11/22/16 when pt see's Dr. Saunders Revel . Pt's daughter is agreeable to plan of care for the pt and thanked me for my call.

## 2016-10-24 ENCOUNTER — Encounter: Payer: Self-pay | Admitting: Family Medicine

## 2016-10-24 ENCOUNTER — Ambulatory Visit (INDEPENDENT_AMBULATORY_CARE_PROVIDER_SITE_OTHER): Payer: Medicare Other | Admitting: Family Medicine

## 2016-10-24 VITALS — BP 110/66 | HR 57 | Temp 98.3°F | Wt 178.5 lb

## 2016-10-24 DIAGNOSIS — I214 Non-ST elevation (NSTEMI) myocardial infarction: Secondary | ICD-10-CM | POA: Diagnosis not present

## 2016-10-24 DIAGNOSIS — R21 Rash and other nonspecific skin eruption: Secondary | ICD-10-CM

## 2016-10-24 DIAGNOSIS — Z09 Encounter for follow-up examination after completed treatment for conditions other than malignant neoplasm: Secondary | ICD-10-CM

## 2016-10-24 NOTE — Progress Notes (Signed)
Subjective:    Patient ID: Andrew [name], male    DOB: 1923/07/23, 83 y.o.   MRN: [id number]  HPI This is a 83 yo male, accompanied by his daughter, who presents today for follow up of hospitalization 10/09/16-10/14/16. He was admitted with chest pain/pressure and was found to have positive troponin, max 34. He was diagnosed with NSTEMI with isolated ST segment elevation with known CAD s/p CABG, NSVT, CKD, moderate AS, mild dementia and generalized weakness and dyspnea. He was continued on plavix, statin, imdur and home health ordered. He was seen in follow up by cardiology 10/22/16 and BMP checked.   Today he denies pain. Has had frequent dry cough that seems to be worse with lying down. Some wheezing prior to hospitalization which has resolved, continues to be SOB and weak. Used a cane and walker prior to hospitalization, since has been using walker and wheelchair. Home PT starts today. No falls. Has been seen x 2 at PCP recently for rash on buttocks. Had negative wound culture x 2, KOH, and HSV culture. Rash was thought to be herpes/shingles initially and he was started on valacyclovir and mupirocin which have been discontinued. At most recent visit 10/22/16, he was given doxycycline 100 mg po BID and clotrimazole. Daughter notes some improvement of rash but it continues to itch.    Past Medical History:  Diagnosis Date  . Allergic rhinitis   . AORTIC STENOSIS   . CAD, ARTERY BYPASS GRAFT   . CAROTID ARTERY STENOSIS 03/07/2010   80%  . CHF (congestive heart failure) ([place])   . COPD, mild ([place]) 12/22/2010  . GASTROESOPHAGEAL REFLUX DISEASE   . History of colonic polyps    1999, 2004  . History of prostate cancer 2002   s/p treatment with seeds / radiation  . HYPERCHOLESTEROLEMIA   . Lumbar spinal stenosis 07/13/2009  . Macular degeneration (senile) of retina 04/29/2014  . Pancreatic cyst 01/29/2013   Noted on MRI scan from [facility] on 07/27/2011.  I reviewed report at  patient request on 01-29-2013.  "Small cystic focus in the posterior pancreatic head.  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  Follow up MRI is advised. The main pancreatic duct remains normal in appearance."  Patient do  . S/P excision of acoustic neuroma   . Skin cancer   . Stroke ([place])   . Thyroid nodule 05/2010   Abnormal biopsy, 83 year old patient and his wife have made informed decision to not pursue surgical resection. Potential risk including cancer has been thoroughly discussed with the patient.   Past Surgical History:  Procedure Laterality Date  . CATARACT EXTRACTION    . CATARACT EXTRACTION  2010  . CHOLECYSTECTOMY    . CORONARY ARTERY BYPASS GRAFT    . CRANIECTOMY FOR EXCISION OF ACOUSTIC NEUROMA  1985  . EYE SURGERY    . SHOULDER SURGERY    . TONSILLECTOMY AND ADENOIDECTOMY  1932  . TOTAL KNEE ARTHROPLASTY    . transperineal implatation of palladium     Family History  Problem Relation Age of Onset  . Emphysema Brother   . Lung cancer Brother   . Heart disease Father   . Hypertension Father   . Heart attack Father   . Colon cancer Sister   . Non-Hodgkin's lymphoma Sister   . Heart disease Brother   . Colon cancer Sister   . Skin cancer Sister   .  Alcohol abuse Unknown   . Arthritis Unknown   . Cancer Unknown   . Macular degeneration Unknown   . Lung cancer Daughter    Social History  Substance Use Topics  . Smoking status: Former Smoker    Packs/day: 2.00    Years: 20.00    Types: Cigarettes    Quit date: 01/22/1961  . Smokeless tobacco: Never Used  . Alcohol use 4.2 oz/week    7 Shots of liquor per week     Comment: 1 drink nightly     Review of Systems Per HPI    Objective:   Physical Exam  Constitutional: He appears well-developed and well-nourished. No distress.  Slow gait, able to transfer from wheelchair to exam table with assistance.   HENT:  Head:  Normocephalic and atraumatic.  Eyes: Conjunctivae are normal.  Cardiovascular: Normal rate and regular rhythm.   Murmur (2/6) heard. Pulmonary/Chest: Effort normal and breath sounds normal. No respiratory distress. He has no wheezes. He has no rales.  Patient was examined sitting and supine, had dry cough with sitting from lying but lungs sounded clear in every position.   Neurological: He is alert.  Poor memory, unable to recall events from today and yesterday as well as further back to hopitalization.   Skin: Skin is warm and dry. Rash noted. He is not diaphoretic.  Bilateral buttocks and upper inner thighs with scattered areas hyperpigmentation, resolving vesicles; erythema along superior gluteal cleft.  Psychiatric: He has a normal mood and affect. His behavior is normal.  Vitals reviewed.     BP 110/66 (BP Location: Left Arm, Patient Position: Sitting, Cuff Size: Normal)   Pulse (!) 57   Temp 98.3 F (36.8 C) (Oral)   Wt 178 lb 8 oz (81 kg)   SpO2 97%   BMI 28.81 kg/m  Wt Readings from Last 3 Encounters:  10/24/16 178 lb 8 oz (81 kg)  10/22/16 177 lb 9.6 oz (80.6 kg)  10/22/16 177 lb 12.8 oz (80.6 kg)       Assessment & Plan:  1. Encounter for examination following treatment at hospital - reviewed medications and services with patient and daughter. Questions answered.  - follow up with Dr. [name] 8-10 weeks, sooner if symptoms occur  2. Rash and nonspecific skin eruption - continue medications prescribed by Dr. Carlota Raspberry. If no improvement in 5-7 days or if worsening, refer to derm. Patient sees Dr. Ronita Hipps and the family can call for appointment if needed or let us know  3. Non-ST elevation (NSTEMI) myocardial infarction [facility]) - continue current medications and follow up with cardiology as scheduled in 1 month   Clarene Reamer, FNP-BC  [facility] [place] [place]  10/24/2016 2:18 PM

## 2016-10-24 NOTE — Patient Instructions (Signed)
Please continue rash treatment- if not better by beginning of next week, please let us know and we will put in a dermatology referral  Continue deep breathing, incentive spirometer  Follow up with Dr. [name] in 8-10 weeks

## 2016-10-26 LAB — WOUND CULTURE: Organism ID, Bacteria: NONE SEEN

## 2016-11-08 ENCOUNTER — Encounter: Payer: Self-pay | Admitting: *Deleted

## 2016-11-15 ENCOUNTER — Other Ambulatory Visit: Payer: Self-pay | Admitting: Internal Medicine

## 2016-11-15 MED ORDER — METOPROLOL SUCCINATE ER 50 MG PO TB24: 50.0000 mg | ORAL_TABLET | Freq: Every day | ORAL | 3 refills | Status: AC

## 2016-11-22 ENCOUNTER — Ambulatory Visit: Payer: Medicare Other | Admitting: Internal Medicine

## 2016-11-22 ENCOUNTER — Encounter: Payer: Self-pay | Admitting: Internal Medicine

## 2016-11-22 ENCOUNTER — Ambulatory Visit (INDEPENDENT_AMBULATORY_CARE_PROVIDER_SITE_OTHER): Payer: Medicare Other | Admitting: Internal Medicine

## 2016-11-22 VITALS — BP 98/56 | HR 65 | Resp 16 | Ht 66.0 in | Wt 179.0 lb

## 2016-11-22 DIAGNOSIS — I739 Peripheral vascular disease, unspecified: Secondary | ICD-10-CM

## 2016-11-22 DIAGNOSIS — I25118 Atherosclerotic heart disease of native coronary artery with other forms of angina pectoris: Secondary | ICD-10-CM | POA: Diagnosis not present

## 2016-11-22 DIAGNOSIS — I351 Nonrheumatic aortic (valve) insufficiency: Secondary | ICD-10-CM

## 2016-11-22 DIAGNOSIS — E785 Hyperlipidemia, unspecified: Secondary | ICD-10-CM

## 2016-11-22 DIAGNOSIS — I350 Nonrheumatic aortic (valve) stenosis: Secondary | ICD-10-CM

## 2016-11-22 DIAGNOSIS — I779 Disorder of arteries and arterioles, unspecified: Secondary | ICD-10-CM

## 2016-11-22 DIAGNOSIS — I5042 Chronic combined systolic (congestive) and diastolic (congestive) heart failure: Secondary | ICD-10-CM | POA: Diagnosis not present

## 2016-11-22 MED ORDER — TORSEMIDE 20 MG PO TABS: 20.0000 mg | ORAL_TABLET | Freq: Every day | ORAL | 1 refills | Status: DC

## 2016-11-22 NOTE — Progress Notes (Signed)
Follow-up Outpatient Visit Date: 11/22/2016  Primary Care Provider: [name], MD [place] [address]  Chief Complaint: Follow-up coronary artery disease and heart failure  HPI:  Andrew [name] is a 83 y.o. year-old male with history of CAD status post remote CABG, moderate aortic stenosis, chronic diastolic heart failure, chronic kidney disease, and stroke, who presents for follow-up of coronary artery disease and heart failure.  I last saw him in September, at which time he denied significant chest pain.  He had noted some cough, for which we started empiric antibiotics for suspected community-acquired pneumonia.  His cough improved after a day or two, but Andrew [name] was subsequently admitted with an episode of chest pain.  He was found to have reduced LVEF by echo and significantly elevated troponin, consistent with NSTEMI.  Given his multiple comorbidities, conservative management was pursued without coronary angiography.  He was also diuresed secondary to fluid retention.  Today, Andrew [name] reports feeling quite well.  He has not had any further episodes of chest pain.  He also denies shortness of breath, lightheadedness, and palpitations.  He had a fall a week ago.  He does not recall the exact circumstances, and his wife was not present during the incident.  He continues to ambulate with a walker and has also been performing physical therapy at [facility].  He feels as though his strength is gradually improving.  At this time, he is alternating 20 and 40 mg daily of torsemide, as directed by [name] at his post hospital follow-up on 10/22/16.  He has not had any significant edema or orthopnea.  --------------------------------------------------------------------------------------------------  Cardiovascular History & Procedures: Cardiovascular Problems:  Coronary artery disease status post CABG in 1991  Moderate aortic stenosis  Ischemic  cardiomyopathy  Risk Factors:  Known coronary artery disease, stroke, hyperlipidemia, male gender, and age greater than 82  Cath/PCI:  LHC (2004): One limb of sequential SVG to OM1, OM2, and OM3 occluded.  CV Surgery:  CABG (1991): LIMA to LAD, SVG to PDA, sequential SVG to OM1, OM2, and OM3.  EP Procedures and Devices:  None  Non-Invasive Evaluation(s):  TTE (10/11/16): Normal LV size with moderate LVH.  LVEF 40-45% without regional wall motion abnormalities.  Grade 2 diastolic dysfunction with elevated filling pressure.  Aortic sclerosis with moderate MR.  Mildly dilated a sending aorta.  Mitral annular calcification and mildly thickened mitral valve leaflets with mild MR.  Moderate left atrial enlargement.  Normal RV size and function.  Severe pulmonary hypertension.  TTE (06/21/16): Mildly dilated left ventricle with mild LVH. LVEF 55-60%. Thickened aortic valve with moderately restricted motion. Mean gradient 22 mmHg. Mild aortic regurgitation and mitral regurgitation. Mild left atrial enlargement. Moderate pulmonic regurgitation. Mild pulmonary hypertension. No significant change from prior echo in 2017.  Carotid artery Doppler (05/28/16): 60-79% left and less than 40% right internal carotid artery stenoses.  Recent CV Pertinent Labs: Lab Results  Component Value Date   CHOL 105 03/29/2016   HDL 40.20 03/29/2016   LDLCALC 46 03/29/2016   LDLDIRECT 67.0 03/13/2010   TRIG 97.0 03/29/2016   CHOLHDL 3 03/29/2016   INR 1.11 06/20/2015   BNP 478.7 (H) 10/09/2016   K 4.5 10/22/2016   MG 2.4 10/14/2016   BUN 37 (H) 10/22/2016   CREATININE 1.56 (H) 10/22/2016    Past medical and surgical history were reviewed and updated in EPIC.  Current Meds  Medication Sig  . acetaminophen (TYLENOL) 500 MG tablet Take  500 mg by mouth every 6 (six) hours as needed (pain).  Marland Kitchen albuterol (PROVENTIL HFA;VENTOLIN HFA) 108 (90 Base) MCG/ACT inhaler Inhale 2 puffs into the lungs every 6 (six)  hours as needed for wheezing or shortness of breath.  . B Complex-C-Folic Acid (NEPHRO-VITE PO) Take 1 tablet by mouth daily.    . cholecalciferol (VITAMIN D) 1000 units tablet Take 1,000 Units by mouth daily.  . clopidogrel (PLAVIX) 75 MG tablet Take 1 tablet (75 mg total) by mouth daily.  [name] 10 MG tablet Take 1 tablet (10 mg total) by mouth daily.  . isosorbide mononitrate (IMDUR) 120 MG 24 hr tablet Take 1 tablet (120 mg total) by mouth at bedtime.  . metoprolol succinate (TOPROL-XL) 50 MG 24 hr tablet Take 1 tablet (50 mg total) by mouth daily. Take with or immediately following a meal.  . Multiple Vitamins-Minerals (PRESERVISION/LUTEIN) CAPS Take 1 capsule by mouth 2 (two) times daily.  . nitroGLYCERIN (NITROSTAT) 0.4 MG SL tablet Place 0.4 mg under the tongue every 5 (five) minutes as needed for chest pain (Max 3 doses with 15 minutes call 911).  Marland Kitchen OVER THE COUNTER MEDICATION Place 1 drop into both eyes daily as needed (dry eyes). OTC lubricating eye drop  . potassium chloride (K-DUR,KLOR-CON) 10 MEQ tablet TAKE TWO TABLETS BY MOUTH DAILY   . pravastatin (PRAVACHOL) 40 MG tablet Take 1 tablet (40 mg total) by mouth at bedtime.  . torsemide (DEMADEX) 20 MG tablet Take 2 tablets (40 mg total) by mouth as directed. Take 40 mg M, W, and Fri's. All other days take 20 mg daily    Allergies: Patient has no known allergies.  Social History   Social History  . Marital status: Married    Spouse name: N/A  . Number of children: N/A  . Years of education: N/A   Occupational History  . retired  At [employer]    and [place] Topics  . Smoking status: Former Smoker    Packs/day: 2.00    Years: 20.00    Types: Cigarettes    Quit date: 01/22/1961  . Smokeless tobacco: Never Used  . Alcohol use 4.2 oz/week    7 Shots of liquor per week     Comment: 1 drink nightly  . Drug use: No  . Sexual activity: No   Other Topics Concern  . Not on file    Social History Narrative  . No narrative on file    Family History  Problem Relation Age of Onset  . Emphysema Brother   . Lung cancer Brother   . Heart disease Father   . Hypertension Father   . Heart attack Father   . Colon cancer Sister   . Non-Hodgkin's lymphoma Sister   . Heart disease Brother   . Colon cancer Sister   . Skin cancer Sister   . Alcohol abuse Unknown   . Arthritis Unknown   . Cancer Unknown   . Macular degeneration Unknown   . Lung cancer Daughter     Review of Systems: A 12-system review of systems was performed and was negative except as noted in the HPI.  --------------------------------------------------------------------------------------------------  Physical Exam: BP (!) 98/56   Pulse 65   Resp 16   Ht 5\' 6"  (1.676 m)   Wt 179 lb (81.2 kg)   SpO2 94%   BMI 28.89 kg/m   General: Well-developed, well-nourished elderly man seated comfortably in the exam room.  He is accompanied by his wife. HEENT: No conjunctival pallor or scleral icterus. Moist mucous membranes.  OP clear. Neck: Supple without lymphadenopathy, thyromegaly, JVD, or HJR. Lungs: Normal work of breathing. Clear to auscultation bilaterally without wheezes or crackles. Heart: Regular rate and rhythm with 2/6 crescendo-decrescendo systolic murmur loudest at the left upper sternal border.  No rubs or gallops. Non-displaced PMI. Abd: Bowel sounds present. Soft, NT/ND without hepatosplenomegaly Ext: No lower extremity edema. Radial, PT, and DP pulses are 2+ bilaterally. Skin: Warm and dry without rash.   Lab Results  Component Value Date   WBC 8.8 10/14/2016   HGB 14.1 10/14/2016   HCT 41.7 10/14/2016   MCV 99.5 10/14/2016   PLT 141 (L) 10/14/2016    Lab Results  Component Value Date   NA 140 10/22/2016   K 4.5 10/22/2016   CL 102 10/22/2016   CO2 27 10/22/2016   BUN 37 (H) 10/22/2016   CREATININE 1.56 (H) 10/22/2016   GLUCOSE 128 (H) 10/22/2016   ALT 25 10/09/2016     Lab Results  Component Value Date   CHOL 105 03/29/2016   HDL 40.20 03/29/2016   LDLCALC 46 03/29/2016   LDLDIRECT 67.0 03/13/2010   TRIG 97.0 03/29/2016   CHOLHDL 3 03/29/2016    --------------------------------------------------------------------------------------------------  ASSESSMENT AND PLAN: Coronary artery disease with stable angina No further episodes of chest pain since his recent hospitalization.  Andrew [name] seems to be tolerating metoprolol succinate and isosorbide mononitrate well.  His blood pressure is borderline low today.  We will not make any changes to this regimen.  We will continue with clopidogrel.  Chronic systolic and diastolic heart failure secondary to ischemic cardiomyopathy Andrew [name] appears euvolemic on exam today.  Assessment of his functional status is limited by his age and gait instability.  Given his borderline low blood pressure and recent fall, we have agreed to decrease torsemide to 20 mg daily.  I advised him to weigh himself daily and to take 40 mg if he gains more than 2 pounds in a day or 5 pounds in a week.  I will check a BMP today to ensure stable renal function and potassium.  Aortic stenosis and regurgitation No significant left ear noted on recent echo, though moderate AI was present.  No symptoms of critical valvular heart disease reported today.  We will continue with medical therapy.  Carotid artery disease No new neurologic symptoms.  Continue with medical therapy, including clopidogrel and pravastatin.  Hyperlipidemia LDL well controlled.  Continue pravastatin.  Follow-up: Return to clinic in 3 months.  Nelva Bush, MD 11/22/2016 2:32 PM

## 2016-11-22 NOTE — Patient Instructions (Signed)
Medication Instructions:  Take torsemide 20 mg daily. If you gain 2 or more pounds in 1 day, or 5 or more pounds in 1 week, take an extra 20 mg of torsemide (total of 40 mg ).  Labwork: BMET today  Testing/Procedures: None  Follow-Up: Your physician recommends that you schedule a follow-up appointment in: 3 months with Dr End. This is scheduled for Friday February 1,2019 at 11:20 AM.        If you need a refill on your cardiac medications before your next appointment, please call your pharmacy.

## 2016-11-23 LAB — BASIC METABOLIC PANEL
BUN/Creatinine Ratio: 20 (ref 10–24)
BUN: 31 mg/dL (ref 10–36)
CALCIUM: 8.9 mg/dL (ref 8.6–10.2)
CO2: 27 mmol/L (ref 20–29)
CREATININE: 1.57 mg/dL — AB (ref 0.76–1.27)
Chloride: 103 mmol/L (ref 96–106)
GFR, EST AFRICAN AMERICAN: 44 mL/min/{1.73_m2} — AB (ref >59)
GFR, EST NON AFRICAN AMERICAN: 38 mL/min/{1.73_m2} — AB (ref >59)
Glucose: 78 mg/dL (ref 65–99)
Potassium: 4.8 mmol/L (ref 3.5–5.2)
Sodium: 143 mmol/L (ref 134–144)

## 2016-12-17 ENCOUNTER — Other Ambulatory Visit: Payer: Self-pay | Admitting: Cardiology

## 2016-12-24 ENCOUNTER — Encounter: Payer: Self-pay | Admitting: Family Medicine

## 2016-12-24 ENCOUNTER — Ambulatory Visit (INDEPENDENT_AMBULATORY_CARE_PROVIDER_SITE_OTHER)
Admission: RE | Admit: 2016-12-24 | Discharge: 2016-12-24 | Disposition: A | Discharge status: Home / Self Care | Payer: Medicare Other | Source: Ambulatory Visit | Attending: Family Medicine | Admitting: Family Medicine

## 2016-12-24 ENCOUNTER — Other Ambulatory Visit: Payer: Self-pay

## 2016-12-24 ENCOUNTER — Ambulatory Visit: Payer: Medicare Other | Admitting: Family Medicine

## 2016-12-24 VITALS — BP 120/64 | HR 73 | Temp 98.4°F | Ht 66.0 in | Wt 177.2 lb

## 2016-12-24 DIAGNOSIS — R9389 Abnormal findings on diagnostic imaging of other specified body structures: Secondary | ICD-10-CM | POA: Diagnosis not present

## 2016-12-24 DIAGNOSIS — R05 Cough: Secondary | ICD-10-CM | POA: Diagnosis not present

## 2016-12-24 DIAGNOSIS — R222 Localized swelling, mass and lump, trunk: Secondary | ICD-10-CM

## 2016-12-24 DIAGNOSIS — R0602 Shortness of breath: Secondary | ICD-10-CM

## 2016-12-24 DIAGNOSIS — R059 Cough, unspecified: Secondary | ICD-10-CM

## 2016-12-24 DIAGNOSIS — F0150 Vascular dementia without behavioral disturbance: Secondary | ICD-10-CM

## 2016-12-24 MED ORDER — DONEPEZIL HCL 5 MG PO TABS: 5.0000 mg | ORAL_TABLET | Freq: Every day | ORAL | 3 refills | Status: DC

## 2016-12-24 NOTE — Patient Instructions (Signed)

## 2016-12-24 NOTE — Progress Notes (Signed)
Dr. Frederico Hamman T. Ludwig Tugwell, MD, [facility] [place], [address] Phone: [phone] Fax: [phone]  12/24/2016  Patient: Andrew [name], MRN: [id number], DOB: 01-20-1924, 83 y.o.  Primary Physician:  [name], MD   Chief Complaint  Patient presents with  . Follow-up   Subjective:   Andrew [name] is a 83 y.o. very pleasant male patient who presents with the following:  The patient presents with 2 primary problems.  He has been having a cough for multiple months as well that is been bothering him quite a bit.  He is quite bothersome to him according to his wife.  He has not found anything that will make his stop.  While admitted to the hospital in the end of September the patient was felt to have pneumonia and pneumonia on chest x-ray, and he was treated and also had an NSTEMI. He was treated with zithromax, then Rocephin and azithromycin while in the hospital.   He also started to develop some memory problems, and particularly this worsened when they moved into the friend's home.  His wife feels like this is a relatively abrupt change during this transition.  At times he will be okay, but other times he will be somewhat disoriented.  Memory problem  Cough really bothers him a lot even with very little exertion - sometimes like he could not have it. Had a humidifier. Does him some.   Inability to breathe.   Aricept  F/u abnormal CXR  Past Medical History, Surgical History, Social History, Family History, Problem List, Medications, and Allergies have been reviewed and updated if relevant.  Patient Active Problem List   Diagnosis Date Noted  . Advanced Planning: DNR 04/09/2016    Priority: High  . Right-sided cerebrovascular accident (CVA) [facility])     Priority: High  . Tachycardia-bradycardia syndrome ([place]) 08/19/2012    Priority: High  . PVD- moderate carotid disease 03/07/2010    Priority: High  . Angina,  class II ([place]) 06/18/2008    Priority: High  . Hx of CABG 06/18/2008    Priority: High  . Moderate aortic stenosis 06/18/2008    Priority: High  . Chronic renal insufficiency, stage III (moderate) (HCC) 06/02/2014    Priority: Medium  . Macular degeneration (senile) of retina 04/29/2014    Priority: Medium  . TIA (transient ischemic attack) 06/03/2013    Priority: Medium  . COPD, minimal-mild 12/22/2010    Priority: Medium  . Spinal stenosis, lumbar region, with neurogenic claudication 07/13/2009    Priority: Medium  . Chronic systolic heart failure ([place])   . NSTEMI (non-ST elevated myocardial infarction) ([place]) 10/09/2016  . Coronary artery disease of native artery of native heart with stable angina pectoris ([place]) 06/28/2016  . Bilateral carotid artery stenosis 06/28/2016  . Hyperlipidemia LDL goal <70 06/28/2016  . Essential hypertension   . Alcohol abuse   . Thrombocytopenia ([place])   . Major depressive disorder, single episode 06/11/2013  . Pancreatic cyst 01/29/2013  . OSA (obstructive sleep apnea) 12/26/2012  . S/P excision of acoustic neuroma 06/18/2010  . Pulmonary nodule 06/17/2010  . Vertigo 06/14/2010  . THYROID NODULE 03/13/2010  . ALLERGIC RHINITIS 09/01/2008  . COLONIC POLYPS, HX OF 09/01/2008  . HYPERCHOLESTEROLEMIA 06/18/2008  . GASTROESOPHAGEAL REFLUX DISEASE 06/18/2008    Past Medical History:  Diagnosis Date  . Allergic rhinitis   . AORTIC STENOSIS   . BPV (benign positional vertigo)   . CAD, ARTERY  BYPASS GRAFT   . CAROTID ARTERY STENOSIS 03/07/2010   80%  . CHF (congestive heart failure) ([place])   . COPD, mild ([place]) 12/22/2010  . GASTROESOPHAGEAL REFLUX DISEASE   . History of colonic polyps    1999, 2004  . History of prostate cancer 2002   s/p treatment with seeds / radiation  . HYPERCHOLESTEROLEMIA   . Legally blind   . Loss of hearing    left,  . Lumbar spinal stenosis 07/13/2009  . Macular degeneration (senile) of retina 04/29/2014  .  Pancreatic cyst 01/29/2013   Noted on MRI scan from [facility] on 07/27/2011.  I reviewed report at patient request on 01-29-2013.  "Small cystic focus in the posterior pancreatic head.  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  Follow up MRI is advised. The main pancreatic duct remains normal in appearance."  Patient do  . S/P excision of acoustic neuroma   . Skin cancer   . Stroke ([place])   . Thyroid nodule 05/2010   Abnormal biopsy, 83 year old patient and his wife have made informed decision to not pursue surgical resection. Potential risk including cancer has been thoroughly discussed with the patient.    Past Surgical History:  Procedure Laterality Date  . CATARACT EXTRACTION    . CATARACT EXTRACTION  2010  . CHOLECYSTECTOMY    . CORONARY ARTERY BYPASS GRAFT    . CRANIECTOMY FOR EXCISION OF ACOUSTIC NEUROMA  1985  . EYE SURGERY    . SHOULDER SURGERY    . TONSILLECTOMY AND ADENOIDECTOMY  1932  . TOTAL KNEE ARTHROPLASTY    . transperineal implatation of palladium      Social History   Socioeconomic History  . Marital status: Married    Spouse name: Not on file  . Number of children: Not on file  . Years of education: Not on file  . Highest education level: Not on file  Social Needs  . Financial resource strain: Not on file  . Food insecurity - worry: Not on file  . Food insecurity - inability: Not on file  . Transportation needs - medical: Not on file  . Transportation needs - non-medical: Not on file  Occupational History  . 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 Smoking status: Former Smoker    Packs/day: 2.00    Years: 20.00    Pack years: 40.00    Types: Cigarettes    Last attempt to quit: 01/22/1961    Years since quitting: 55.9  . Smokeless tobacco: Never Used  Substance and Sexual Activity  . Alcohol use:  Yes    Alcohol/week: 4.2 oz    Types: 7 Shots of liquor per week    Comment: 1 drink nightly  . Drug use: No  . Sexual activity: No  Other Topics Concern  . Not on file  Social History Narrative  . Not on file    Family History  Problem Relation Age of Onset  . Emphysema Brother   . Lung cancer Brother   . Heart disease Father   . Hypertension Father   . Heart attack Father   . Colon cancer Sister   . Non-Hodgkin's lymphoma Sister   . Heart disease Brother   . Colon cancer Sister   . Skin cancer Sister   . Alcohol abuse Unknown   .  Arthritis Unknown   . Cancer Unknown   . Macular degeneration Unknown   . Lung cancer Daughter     No Known Allergies  Medication list reviewed and updated in full in [place].   GEN: No acute illnesses, no fevers, chills. GI: No n/v/d, eating normally Pulm: No SOB Interactive and getting along well at home.  Otherwise, ROS is as per the HPI.  Objective:   BP 120/64   Pulse 73   Temp 98.4 F (36.9 C) (Oral)   Ht 5\' 6"  (1.676 m)   Wt 177 lb 4 oz (80.4 kg)   SpO2 92%   BMI 28.61 kg/m   GEN: WDWN, NAD, Non-toxic, A & O x 3 HEENT: Atraumatic, Normocephalic. Neck supple. No masses, No LAD. Ears and Nose: No external deformity. CV: RRR, No M/G/R. No JVD. No thrill. No extra heart sounds. PULM: CTA B, no wheezes, crackles, rhonchi. No retractions. No resp. distress. No accessory muscle use. EXTR: No c/c/e NEURO Normal gait.  PSYCH: Normally interactive. Conversant. Not depressed or anxious appearing.  Calm demeanor.   Laboratory and Imaging Data: Dg Chest 2 View  Result Date: 12/24/2016 CLINICAL DATA:  Shortness of breath.  Long-term smoker. EXAM: CHEST  2 VIEW COMPARISON:  10/09/2016 FINDINGS: Low volume film. Cardiopericardial silhouette is at upper limits of normal for size. Although airspace disease seen peripherally in the right lung base on the prior study is improved there is progressive right infrahilar and right  suprahilar opacity on today's study suggesting recurrent pneumonia. Patient is status post median sternotomy. Degenerative changes are noted in each shoulder. IMPRESSION: Patchy airspace opacity in the right suprahilar and infrahilar lung concerning for multifocal pneumonia. These results will be called to the ordering clinician or representative by the Radiologist Assistant, and communication documented in the PACS or zVision Dashboard. Electronically Signed   By: Misty Stanley M.D.   On: 12/24/2016 20:55     Assessment and Plan:   Chest mass - Plan: Basic metabolic panel  Shortness of breath - Plan: DG Chest 2 View, Basic metabolic panel, CT Chest W Contrast  Abnormal chest x-ray - Plan: Basic metabolic panel  Vascular dementia without behavioral disturbance  Cough  I am worried about the changes in the patient's right long.  They have been present now for at least more than 2 months.  At this point, I do not believe that you can ascribe them to infectious process without further delineation.  He is currently afebrile and does not feel sick at all.  I am going to obtain a CT of the chest with contrast to better characterize and rule out any neoplastic process.  In terms of the patient's dementia, by history this sounds to be more vascular in character, and he has had known strokes in the past as well as TIAs.  He is on appropriate post stroke medication, and I think it is also reasonable to start some Aricept.  Follow-up: No Follow-up on file.  Future Appointments  Date Time Provider [place]  12/25/2016 11:15 AM LBCT-CT 1 LBCT-CT LB-[facility]  02/22/2017 11:20 AM End, Harrell Gave, MD [facility]  03/11/2017  3:00 PM MC-CV HS VASC 4 [facility]-H[facility]I [facility]  03/11/2017  3:45 PM Nickel, Sharmon Leyden, NP [facility]    Meds ordered this encounter  Medications  . donepezil (ARICEPT) 5 MG tablet    Sig: Take 1 tablet (5 mg total) by mouth at bedtime.    Dispense:  90 tablet  Refill:  3   There are no discontinued medications. Orders Placed This Encounter  Procedures  . DG Chest 2 View  . CT Chest W Contrast  . Basic metabolic panel    Signed,  Barby Colvard T. Nakhia Levitan, MD   Allergies as of 12/24/2016   No Known Allergies     Medication List        Accurate as of 12/24/16 11:59 PM. Always use your most recent med list.          acetaminophen 500 MG tablet Commonly known as:  TYLENOL Take 500 mg by mouth every 6 (six) hours as needed (pain).   albuterol 108 (90 Base) MCG/ACT inhaler Commonly known as:  PROVENTIL HFA;VENTOLIN HFA Inhale 2 puffs into the lungs every 6 (six) hours as needed for wheezing or shortness of breath.   cholecalciferol 1000 units tablet Commonly known as:  VITAMIN D Take 1,000 Units by mouth daily.   clopidogrel 75 MG tablet Commonly known as:  PLAVIX Take 1 tablet (75 mg total) by mouth daily.   donepezil 5 MG tablet Commonly known as:  ARICEPT Take 1 tablet (5 mg total) by mouth at bedtime.   FLUoxetine 10 MG tablet Commonly known as:  PROZAC Take 1 tablet (10 mg total) by mouth daily.   isosorbide mononitrate 120 MG 24 hr tablet Commonly known as:  IMDUR Take 1 tablet (120 mg total) by mouth at bedtime.   metoprolol succinate 50 MG 24 hr tablet Commonly known as:  TOPROL-XL Take 1 tablet (50 mg total) by mouth daily. Take with or immediately following a meal.   NEPHRO-VITE PO Take 1 tablet by mouth daily.   nitroGLYCERIN 0.4 MG/SPRAY spray Commonly known as:  NITROLINGUAL Place 1 spray under the tongue every 5 (five) minutes x 3 doses as needed for chest pain.   OVER THE COUNTER MEDICATION Place 1 drop into both eyes daily as needed (dry eyes). OTC lubricating eye drop   potassium chloride 10 MEQ tablet Commonly known as:  K-DUR,KLOR-CON TAKE TWO TABLETS BY MOUTH DAILY   pravastatin 40 MG tablet Commonly known as:  PRAVACHOL Take 1 tablet (40 mg total) by mouth at bedtime.   PRESERVISION/LUTEIN  Caps Take 1 capsule by mouth 2 (two) times daily.   torsemide 20 MG tablet Commonly known as:  DEMADEX Take 1 tablet (20 mg total) by mouth daily.

## 2016-12-25 ENCOUNTER — Telehealth: Payer: Self-pay | Admitting: Family Medicine

## 2016-12-25 ENCOUNTER — Encounter: Payer: Self-pay | Admitting: Family Medicine

## 2016-12-25 ENCOUNTER — Other Ambulatory Visit: Payer: Self-pay | Admitting: Family Medicine

## 2016-12-25 ENCOUNTER — Ambulatory Visit (INDEPENDENT_AMBULATORY_CARE_PROVIDER_SITE_OTHER)
Admission: RE | Admit: 2016-12-25 | Discharge: 2016-12-25 | Disposition: A | Discharge status: Home / Self Care | Payer: Medicare Other | Source: Ambulatory Visit | Attending: Family Medicine | Admitting: Family Medicine

## 2016-12-25 DIAGNOSIS — R0602 Shortness of breath: Secondary | ICD-10-CM | POA: Diagnosis not present

## 2016-12-25 DIAGNOSIS — F0150 Vascular dementia without behavioral disturbance: Secondary | ICD-10-CM | POA: Insufficient documentation

## 2016-12-25 LAB — BASIC METABOLIC PANEL
BUN: 27 mg/dL — AB (ref 6–23)
CALCIUM: 9.1 mg/dL (ref 8.4–10.5)
CHLORIDE: 102 meq/L (ref 96–112)
CO2: 29 mEq/L (ref 19–32)
CREATININE: 1.41 mg/dL (ref 0.40–1.50)
GFR: 49.99 mL/min — AB (ref >60.00)
Glucose, Bld: 90 mg/dL (ref 70–99)
Potassium: 4.5 mEq/L (ref 3.5–5.1)
Sodium: 139 mEq/L (ref 135–145)

## 2016-12-25 MED ORDER — LEVOFLOXACIN 250 MG PO TABS: ORAL_TABLET | ORAL | 0 refills | Status: DC

## 2016-12-25 MED ORDER — IOPAMIDOL (ISOVUE-300) INJECTION 61%
65.0000 mL | Freq: Once | INTRAVENOUS | Status: AC | PRN
  Administered 2016-12-25: 65 mL via INTRAVENOUS

## 2016-12-25 NOTE — Telephone Encounter (Signed)
Per imaging note appears pt has appt for CT of Chest 12/25/16 at 11:15.

## 2016-12-25 NOTE — Telephone Encounter (Signed)
Andrew [name] from [facility] is calling to report:  Patchy are space opacity in the right superhilar and inferior hilar lung concerning for multi-focal pneumonia. / Taken today at 8:20, read back and confirmed /

## 2016-12-25 NOTE — Progress Notes (Signed)
LVQ dosing based on CrCl of 34.  Basic Metabolic Panel:    Component Value Date/Time   NA 139 12/24/2016 1601   NA 143 11/22/2016 1501   K 4.5 12/24/2016 1601   CL 102 12/24/2016 1601   CO2 29 12/24/2016 1601   BUN 27 (H) 12/24/2016 1601   BUN 31 11/22/2016 1501   CREATININE 1.41 12/24/2016 1601   GLUCOSE 90 12/24/2016 1601   CALCIUM 9.1 12/24/2016 1601

## 2016-12-25 NOTE — Telephone Encounter (Signed)
Abnormality seen - additional follow-up CT is pending.

## 2017-01-04 ENCOUNTER — Other Ambulatory Visit: Payer: Self-pay | Admitting: Internal Medicine

## 2017-01-04 DIAGNOSIS — I5032 Chronic diastolic (congestive) heart failure: Secondary | ICD-10-CM

## 2017-01-04 DIAGNOSIS — I350 Nonrheumatic aortic (valve) stenosis: Secondary | ICD-10-CM

## 2017-01-04 NOTE — Telephone Encounter (Signed)
Please review for refill. Thanks!  

## 2017-01-17 ENCOUNTER — Encounter: Payer: Self-pay | Admitting: Family Medicine

## 2017-01-17 ENCOUNTER — Other Ambulatory Visit: Payer: Self-pay

## 2017-01-17 ENCOUNTER — Ambulatory Visit: Payer: Medicare Other | Admitting: Family Medicine

## 2017-01-17 VITALS — BP 100/80 | HR 59 | Temp 97.5°F | Ht 66.0 in | Wt 181.2 lb

## 2017-01-17 DIAGNOSIS — J189 Pneumonia, unspecified organism: Secondary | ICD-10-CM

## 2017-01-17 DIAGNOSIS — R9389 Abnormal findings on diagnostic imaging of other specified body structures: Secondary | ICD-10-CM | POA: Diagnosis not present

## 2017-01-17 DIAGNOSIS — I739 Peripheral vascular disease, unspecified: Secondary | ICD-10-CM

## 2017-01-17 DIAGNOSIS — I639 Cerebral infarction, unspecified: Secondary | ICD-10-CM | POA: Diagnosis not present

## 2017-01-17 DIAGNOSIS — J181 Lobar pneumonia, unspecified organism: Secondary | ICD-10-CM | POA: Diagnosis not present

## 2017-01-17 LAB — BASIC METABOLIC PANEL
BUN: 34 mg/dL — AB (ref 6–23)
CHLORIDE: 100 meq/L (ref 96–112)
CO2: 31 meq/L (ref 19–32)
CREATININE: 1.49 mg/dL (ref 0.40–1.50)
Calcium: 8.7 mg/dL (ref 8.4–10.5)
GFR: 46.89 mL/min — ABNORMAL LOW (ref >60.00)
Glucose, Bld: 69 mg/dL — ABNORMAL LOW (ref 70–99)
Potassium: 4.2 mEq/L (ref 3.5–5.1)
Sodium: 137 mEq/L (ref 135–145)

## 2017-01-17 NOTE — Progress Notes (Signed)
Dr. Frederico Hamman T. Romolo Sieling, MD, [facility] Primary [facility] and Sports Medicine [place], [address] Phone: [phone] Fax: [phone]  01/17/2017  Patient: Andrew [name], MRN: [id number], DOB: 1923/09/28, 83 y.o.  Primary Physician:  [name], MD   Chief Complaint  Patient presents with  . Follow-up    PNA   Subjective:   Andrew [name] is a 83 y.o. very pleasant male patient who presents with the following:  Still a little bit of cough.   Very pleasant 83 year old gentleman who I know well who presents in follow-up after right middle lobe and right upper lobe pneumonia and abnormality on chest CT from 3 weeks prior.  This interpretation is as per below.  Previously discussed with the patient and his wife.  His daughter is here today additionally, and she asked a number of different questions.  He is feeling better, he is breathing better, he does still occasionally have a cough.  Past Medical History, Surgical History, Social History, Family History, Problem List, Medications, and Allergies have been reviewed and updated if relevant.  Patient Active Problem List   Diagnosis Date Noted  . Advanced Planning: DNR 04/09/2016    Priority: High  . Right-sided cerebrovascular accident (CVA) [facility])     Priority: High  . Tachycardia-bradycardia syndrome ([place]) 08/19/2012    Priority: High  . PVD- moderate carotid disease 03/07/2010    Priority: High  . Angina, class II ([place]) 06/18/2008    Priority: High  . Hx of CABG 06/18/2008    Priority: High  . Moderate aortic stenosis 06/18/2008    Priority: High  . Chronic renal insufficiency, stage III (moderate) (HCC) 06/02/2014    Priority: Medium  . Macular degeneration (senile) of retina 04/29/2014    Priority: Medium  . TIA (transient ischemic attack) 06/03/2013    Priority: Medium  . COPD, minimal-mild 12/22/2010    Priority: Medium  . Spinal stenosis, lumbar region, with neurogenic claudication  07/13/2009    Priority: Medium  . Vascular dementia without behavioral disturbance 12/25/2016  . Chronic systolic heart failure ([place])   . NSTEMI (non-ST elevated myocardial infarction) ([place]) 10/09/2016  . Coronary artery disease of native artery of native heart with stable angina pectoris ([place]) 06/28/2016  . Bilateral carotid artery stenosis 06/28/2016  . Hyperlipidemia LDL goal <70 06/28/2016  . Essential hypertension   . Alcohol abuse   . Thrombocytopenia ([place])   . Major depressive disorder, single episode 06/11/2013  . Pancreatic cyst 01/29/2013  . OSA (obstructive sleep apnea) 12/26/2012  . S/P excision of acoustic neuroma 06/18/2010  . Pulmonary nodule 06/17/2010  . Vertigo 06/14/2010  . THYROID NODULE 03/13/2010  . ALLERGIC RHINITIS 09/01/2008  . COLONIC POLYPS, HX OF 09/01/2008  . HYPERCHOLESTEROLEMIA 06/18/2008  . GASTROESOPHAGEAL REFLUX DISEASE 06/18/2008    Past Medical History:  Diagnosis Date  . Allergic rhinitis   . AORTIC STENOSIS   . BPV (benign positional vertigo)   . CAD, ARTERY BYPASS GRAFT   . CAROTID ARTERY STENOSIS 03/07/2010   80%  . CHF (congestive heart failure) ([place])   . COPD, mild ([place]) 12/22/2010  . GASTROESOPHAGEAL REFLUX DISEASE   . History of colonic polyps    1999, 2004  . History of prostate cancer 2002   s/p treatment with seeds / radiation  . HYPERCHOLESTEROLEMIA   . Legally blind   . Loss of hearing    left,  . Lumbar spinal stenosis 07/13/2009  . Macular degeneration (senile) of  retina 04/29/2014  . Pancreatic cyst 01/29/2013   Noted on MRI scan from [facility] on 07/27/2011.  I reviewed report at patient request on 01-29-2013.  "Small cystic focus in the posterior pancreatic head.  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  Follow up MRI is advised. The main pancreatic duct remains normal in appearance."  Patient do  . S/P excision of  acoustic neuroma   . Skin cancer   . Stroke ([place])   . Thyroid nodule 05/2010   Abnormal biopsy, 83 year old patient and his wife have made informed decision to not pursue surgical resection. Potential risk including cancer has been thoroughly discussed with the patient.    Past Surgical History:  Procedure Laterality Date  . CATARACT EXTRACTION    . CATARACT EXTRACTION  2010  . CHOLECYSTECTOMY    . CORONARY ARTERY BYPASS GRAFT    . CRANIECTOMY FOR EXCISION OF ACOUSTIC NEUROMA  1985  . EYE SURGERY    . SHOULDER SURGERY    . TONSILLECTOMY AND ADENOIDECTOMY  1932  . TOTAL KNEE ARTHROPLASTY    . transperineal implatation of palladium      Social History   Socioeconomic History  . Marital status: Married    Spouse name: Not on file  . Number of children: Not on file  . Years of education: Not on file  . Highest education level: Not on file  Social Needs  . Financial resource strain: Not on file  . Food insecurity - worry: Not on file  . Food insecurity - inability: Not on file  . Transportation needs - medical: Not on file  . Transportation needs - non-medical: Not on file  Occupational History  . 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 Smoking status: Former Smoker    Packs/day: 2.00    Years: 20.00    Pack years: 40.00    Types: Cigarettes    Last attempt to quit: 01/22/1961    Years since quitting: 56.0  . Smokeless tobacco: Never Used  Substance and Sexual Activity  . Alcohol use: Yes    Alcohol/week: 4.2 oz    Types: 7 Shots of liquor per week    Comment: 1 drink nightly  . Drug use: No  . Sexual activity: No  Other Topics Concern  . Not on file  Social History Narrative  . Not on file    Family History  Problem Relation Age of Onset  . Emphysema Brother   . Lung cancer Brother   . Heart disease Father   . Hypertension Father   . Heart attack Father   . Colon cancer Sister   . Non-Hodgkin's lymphoma  Sister   . Heart disease Brother   . Colon cancer Sister   . Skin cancer Sister   . Alcohol abuse Unknown   . Arthritis Unknown   . Cancer Unknown   . Macular degeneration Unknown   . Lung cancer Daughter     No Known Allergies  Medication list reviewed and updated in full in [place].   GEN: No acute illnesses, no fevers, chills. GI: No n/v/d, eating normally Pulm: No SOB Interactive and getting along well at home.  Otherwise, ROS is as per the HPI.  Objective:   BP 100/80   Pulse (!) 59   Temp (!) 97.5  F (36.4 C) (Oral)   Ht 5\' 6"  (1.676 m)   Wt 181 lb 4 oz (82.2 kg)   SpO2 92%   BMI 29.25 kg/m   GEN: WDWN, NAD, Non-toxic, A & O x 3 HEENT: Atraumatic, Normocephalic. Neck supple. No masses, No LAD. Ears and Nose: No external deformity. CV: RRR, 2/6 SEM. No JVD. No thrill. No extra heart sounds. PULM: CTA B, no wheezes, crackles, rhonchi. No retractions. No resp. distress. No accessory muscle use. EXTR: No c/c/e NEURO Normal gait.  PSYCH: Normally interactive. Conversant. Not depressed or anxious appearing.  Calm demeanor.   Laboratory and Imaging Data: Dg Chest 2 View  Result Date: 12/24/2016 CLINICAL DATA:  Shortness of breath.  Long-term smoker. EXAM: CHEST  2 VIEW COMPARISON:  10/09/2016 FINDINGS: Low volume film. Cardiopericardial silhouette is at upper limits of normal for size. Although airspace disease seen peripherally in the right lung base on the prior study is improved there is progressive right infrahilar and right suprahilar opacity on today's study suggesting recurrent pneumonia. Patient is status post median sternotomy. Degenerative changes are noted in each shoulder. IMPRESSION: Patchy airspace opacity in the right suprahilar and infrahilar lung concerning for multifocal pneumonia. These results will be called to the ordering clinician or representative by the Radiologist Assistant, and communication documented in the PACS or zVision Dashboard.  Electronically Signed   By: Misty Stanley M.D.   On: 12/24/2016 20:55   Ct Chest W Contrast  Result Date: 12/25/2016 CLINICAL DATA:  Shortness of breath, cough. EXAM: CT CHEST WITH CONTRAST TECHNIQUE: Multidetector CT imaging of the chest was performed during intravenous contrast administration. CONTRAST:  41mL ISOVUE-300 IOPAMIDOL (ISOVUE-300) INJECTION 61% COMPARISON:  Radiographs of December 24, 2016. CT scan of August 06, 2011. FINDINGS: Cardiovascular: Atherosclerosis of thoracic aorta is noted without aneurysm or dissection. Status post coronary artery bypass graft. No pericardial effusion is noted. There appears to be a penetrating atherosclerotic ulcer measuring 19 x 9 mm along the lateral portion of the transverse aortic arch. Mediastinum/Nodes: 1.9 cm right thyroid nodule is noted. Thyroid ultrasound is recommended. 11 mm pretracheal lymph node is noted. 11 mm precarinal lymph node is noted. 12 mm subcarinal lymph node is noted. Lungs/Pleura: No pneumothorax or pleural effusion is noted. Bronchiectasis is noted in the lower lobes bilaterally. New multifocal airspace opacities are noted in the right upper and middle lobes as well as the right infrahilar area. This may simply represent pneumonia. Probable scarring is noted anteriorly in the left upper lobe. Upper Abdomen: No acute abnormality. Musculoskeletal: No chest wall abnormality. No acute or significant osseous findings. IMPRESSION: Multifocal airspace opacities are noted in the right upper and middle lobes, as well as in the right infrahilar area of the right lower lobe. These may simply represent multifocal pneumonia, but follow-up CT scan in 2-3 weeks is recommended to ensure resolution or stability, and rule out underlying mass or neoplasm. 1.9 x 0.9 cm penetrating atherosclerotic ulcer is seen involving the transverse aortic arch. Mildly enlarged mediastinal adenopathy is noted, with the largest lymph node measuring 12 mm in the subcarinal  region. This may be inflammatory in etiology, but malignancy or metastatic disease cannot be excluded. Continued follow-up is recommended. 1.9 cm right thyroid nodule is again noted. Thyroid ultrasound is recommended for further evaluation. Aortic Atherosclerosis (ICD10-I70.0). Electronically Signed   By: Marijo Conception, M.D.   On: 12/25/2016 14:03     Assessment and Plan:   Community acquired pneumonia of right middle lobe of  lung ([place]) - Plan: Basic metabolic panel, CT Chest W Contrast  Abnormal chest CT - Plan: Basic metabolic panel, CT Chest W Contrast  PVD- moderate carotid disease  Right-sided cerebrovascular accident (CVA) ([place])   >25 minutes spent in face to face time with patient, >50% spent in counselling or coordination of care: Additional conversation regarding carotid disease and a 83 year old who is not going to consider carotid endarterectomy.  The patient, his wife, as well as his daughter and myself all agreed that it would be reasonable to stop doing routine, regular carotid vascular scans.  Follow-up chest CT, prior abnormal chest CT in a patient with heavy occupational exposure as well as heavy tobacco abuse distantly.  Evaluate post treatment of pneumonia for potential abnormality such as that which would be indicative of potential malignancy.  Obtain a CT of the chest with contrast.  Follow-up: No Follow-up on file.  Future Appointments  Date Time Provider [place]  01/23/2017  3:00 PM LBCT-CT 1 LBCT-CT LB-CT [facility]  02/22/2017 11:20 AM End, Harrell Gave, MD [facility]  03/11/2017  3:00 PM MC-CV HS VASC 6 MC-HCVI [facility]  03/11/2017  3:45 PM Nickel, Sharmon Leyden, NP [facility]   Medications Discontinued During This Encounter  Medication Reason  . levofloxacin (LEVAQUIN) 250 MG tablet Completed Course   Orders Placed This Encounter  Procedures  . CT Chest W Contrast  . Basic metabolic panel    Signed,  Cl[place]on Rosselli T. Seda Kronberg, MD   Allergies as of  01/17/2017   No Known Allergies     Medication List        Accurate as of 01/17/17  4:10 PM. Always use your most recent med list.          acetaminophen 500 MG tablet Commonly known as:  TYLENOL Take 500 mg by mouth every 6 (six) hours as needed (pain).   albuterol 108 (90 Base) MCG/ACT inhaler Commonly known as:  PROVENTIL HFA;VENTOLIN HFA Inhale 2 puffs into the lungs every 6 (six) hours as needed for wheezing or shortness of breath.   cholecalciferol 1000 units tablet Commonly known as:  VITAMIN D Take 1,000 Units by mouth daily.   clopidogrel 75 MG tablet Commonly known as:  PLAVIX TAKE ONE TABLET BY MOUTH ONE TIME DAILY   donepezil 5 MG tablet Commonly known as:  ARICEPT Take 1 tablet (5 mg total) by mouth at bedtime.   FLUoxetine 10 MG tablet Commonly known as:  PROZAC Take 1 tablet (10 mg total) by mouth daily.   isosorbide mononitrate 120 MG 24 hr tablet Commonly known as:  IMDUR Take 1 tablet (120 mg total) by mouth at bedtime.   metoprolol succinate 50 MG 24 hr tablet Commonly known as:  TOPROL-XL Take 1 tablet (50 mg total) by mouth daily. Take with or immediately following a meal.   NEPHRO-VITE PO Take 1 tablet by mouth daily.   nitroGLYCERIN 0.4 MG/SPRAY spray Commonly known as:  NITROLINGUAL Place 1 spray under the tongue every 5 (five) minutes x 3 doses as needed for chest pain.   OVER THE COUNTER MEDICATION Place 1 drop into both eyes daily as needed (dry eyes). OTC lubricating eye drop   potassium chloride 10 MEQ tablet Commonly known as:  K-DUR,KLOR-CON TAKE TWO TABLETS BY MOUTH DAILY   pravastatin 40 MG tablet Commonly known as:  PRAVACHOL Take 1 tablet (40 mg total) by mouth at bedtime.   PRESERVISION/LUTEIN Caps Take 1 capsule by mouth 2 (two) times daily.   torsemide  20 MG tablet Commonly known as:  DEMADEX Take 1 tablet (20 mg total) by mouth daily.

## 2017-01-17 NOTE — Patient Instructions (Signed)

## 2017-01-23 ENCOUNTER — Ambulatory Visit (INDEPENDENT_AMBULATORY_CARE_PROVIDER_SITE_OTHER)
Admission: RE | Admit: 2017-01-23 | Discharge: 2017-01-23 | Disposition: A | Discharge status: Home / Self Care | Payer: Medicare Other | Source: Ambulatory Visit | Attending: Family Medicine | Admitting: Family Medicine

## 2017-01-23 DIAGNOSIS — J181 Lobar pneumonia, unspecified organism: Secondary | ICD-10-CM

## 2017-01-23 DIAGNOSIS — R9389 Abnormal findings on diagnostic imaging of other specified body structures: Secondary | ICD-10-CM

## 2017-01-23 DIAGNOSIS — J189 Pneumonia, unspecified organism: Secondary | ICD-10-CM

## 2017-01-23 MED ORDER — IOPAMIDOL (ISOVUE-300) INJECTION 61%
80.0000 mL | Freq: Once | INTRAVENOUS | Status: AC | PRN
  Administered 2017-01-23: 70 mL via INTRAVENOUS

## 2017-01-24 ENCOUNTER — Other Ambulatory Visit: Payer: Self-pay | Admitting: Family Medicine

## 2017-01-24 DIAGNOSIS — Z87891 Personal history of nicotine dependence: Secondary | ICD-10-CM

## 2017-01-24 DIAGNOSIS — R9389 Abnormal findings on diagnostic imaging of other specified body structures: Secondary | ICD-10-CM

## 2017-01-30 ENCOUNTER — Ambulatory Visit: Payer: Medicare Other | Admitting: Family Medicine

## 2017-01-30 ENCOUNTER — Encounter: Payer: Self-pay | Admitting: Family Medicine

## 2017-01-30 ENCOUNTER — Other Ambulatory Visit: Payer: Self-pay

## 2017-01-30 VITALS — BP 100/60 | HR 55 | Temp 97.4°F | Ht 66.0 in | Wt 180.2 lb

## 2017-01-30 DIAGNOSIS — M48062 Spinal stenosis, lumbar region with neurogenic claudication: Secondary | ICD-10-CM | POA: Diagnosis not present

## 2017-01-30 NOTE — Patient Instructions (Addendum)
Aspercreme with 4%.  TENS unit.   Careful, rare use of Alleve.  Take Tylenol/Acetaminophen ES (500mg ) 2 tabs by mouth three times a day max as needed.

## 2017-01-30 NOTE — Progress Notes (Signed)
Dr. Frederico Hamman T. Melodie Ashworth, MD, [facility] Primary [facility] and Sports Medicine [place], [address] Phone: [phone] Fax: [phone]  01/30/2017  Patient: Andrew [name], MRN: [id number], DOB: Jun 03, 1923, 84 y.o.  Primary Physician:  [name], MD   Chief Complaint  Patient presents with  . Hip Pain    Left   Subjective:   Andrew [name] is a 84 y.o. very pleasant male patient who presents with the following:  Left hip is hurting him a lot and getting trouble comfortable. Painful with getting up and walking to the bathroom. Feels like left size won't move really well or move. Before recently, no hip pain. Feels like it is in the posterior aspect of the pelvis. Some pain in the back, too. No pain in the groin.   "Left leg having a hard time holding him up".  Past Medical History, Surgical History, Social History, Family History, Problem List, Medications, and Allergies have been reviewed and updated if relevant.  Patient Active Problem List   Diagnosis Date Noted  . Advanced Planning: DNR 04/09/2016    Priority: High  . Right-sided cerebrovascular accident (CVA) [facility])     Priority: High  . Tachycardia-bradycardia syndrome ([place]) 08/19/2012    Priority: High  . PVD- moderate carotid disease 03/07/2010    Priority: High  . Angina, class II ([place]) 06/18/2008    Priority: High  . Hx of CABG 06/18/2008    Priority: High  . Moderate aortic stenosis 06/18/2008    Priority: High  . Chronic renal insufficiency, stage III (moderate) (HCC) 06/02/2014    Priority: Medium  . Macular degeneration (senile) of retina 04/29/2014    Priority: Medium  . TIA (transient ischemic attack) 06/03/2013    Priority: Medium  . COPD, minimal-mild 12/22/2010    Priority: Medium  . Spinal stenosis, lumbar region, with neurogenic claudication 07/13/2009    Priority: Medium  . Vascular dementia without behavioral disturbance 12/25/2016  . Chronic systolic heart failure  ([place])   . NSTEMI (non-ST elevated myocardial infarction) ([place]) 10/09/2016  . Coronary artery disease of native artery of native heart with stable angina pectoris ([place]) 06/28/2016  . Bilateral carotid artery stenosis 06/28/2016  . Hyperlipidemia LDL goal <70 06/28/2016  . Essential hypertension   . Alcohol abuse   . Thrombocytopenia ([place])   . Major depressive disorder, single episode 06/11/2013  . Pancreatic cyst 01/29/2013  . OSA (obstructive sleep apnea) 12/26/2012  . S/P excision of acoustic neuroma 06/18/2010  . Pulmonary nodule 06/17/2010  . Vertigo 06/14/2010  . THYROID NODULE 03/13/2010  . ALLERGIC RHINITIS 09/01/2008  . COLONIC POLYPS, HX OF 09/01/2008  . HYPERCHOLESTEROLEMIA 06/18/2008  . GASTROESOPHAGEAL REFLUX DISEASE 06/18/2008    Past Medical History:  Diagnosis Date  . Allergic rhinitis   . AORTIC STENOSIS   . BPV (benign positional vertigo)   . CAD, ARTERY BYPASS GRAFT   . CAROTID ARTERY STENOSIS 03/07/2010   80%  . CHF (congestive heart failure) ([place])   . COPD, mild ([place]) 12/22/2010  . GASTROESOPHAGEAL REFLUX DISEASE   . History of colonic polyps    1999, 2004  . History of prostate cancer 2002   s/p treatment with seeds / radiation  . HYPERCHOLESTEROLEMIA   . Legally blind   . Loss of hearing    left,  . Lumbar spinal stenosis 07/13/2009  . Macular degeneration (senile) of retina 04/29/2014  . Pancreatic cyst 01/29/2013   Noted on MRI scan [facility] on 07/27/2011.  I reviewed report at patient request on 01-29-2013.  "Small cystic focus in the posterior pancreatic head.  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  Follow up MRI is advised. The main pancreatic duct remains normal in appearance."  Patient do  . S/P excision of acoustic neuroma   . Skin cancer   . Stroke ([place])   . Thyroid nodule 05/2010   Abnormal biopsy, 84 year old patient and his wife  have made informed decision to not pursue surgical resection. Potential risk including cancer has been thoroughly discussed with the patient.    Past Surgical History:  Procedure Laterality Date  . CATARACT EXTRACTION    . CATARACT EXTRACTION  2010  . CHOLECYSTECTOMY    . CORONARY ARTERY BYPASS GRAFT    . CRANIECTOMY FOR EXCISION OF ACOUSTIC NEUROMA  1985  . EYE SURGERY    . SHOULDER SURGERY    . TONSILLECTOMY AND ADENOIDECTOMY  1932  . TOTAL KNEE ARTHROPLASTY    . transperineal implatation of palladium      Social History   Socioeconomic History  . Marital status: Married    Spouse name: Not on file  . Number of children: Not on file  . Years of education: Not on file  . Highest education level: Not on file  Social Needs  . Financial resource strain: Not on file  . Food insecurity - worry: Not on file  . Food insecurity - inability: Not on file  . Transportation needs - medical: Not on file  . Transportation needs - non-medical: Not on file  Occupational History  . 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 Smoking status: Former Smoker    Packs/day: 2.00    Years: 20.00    Pack years: 40.00    Types: Cigarettes    Last attempt to quit: 01/22/1961    Years since quitting: 56.0  . Smokeless tobacco: Never Used  Substance and Sexual Activity  . Alcohol use: Yes    Alcohol/week: 4.2 oz    Types: 7 Shots of liquor per week    Comment: 1 drink nightly  . Drug use: No  . Sexual activity: No  Other Topics Concern  . Not on file  Social History Narrative  . Not on file    Family History  Problem Relation Age of Onset  . Emphysema Brother   . Lung cancer Brother   . Heart disease Father   . Hypertension Father   . Heart attack Father   . Colon cancer Sister   . Non-Hodgkin's lymphoma Sister   . Heart disease Brother   . Colon cancer Sister   . Skin cancer Sister   . Alcohol abuse Unknown   . Arthritis Unknown    . Cancer Unknown   . Macular degeneration Unknown   . Lung cancer Daughter     No Known Allergies  Medication list reviewed and updated in full in [place].  GEN: No fevers, chills. Nontoxic. Primarily MSK c/o today. MSK: Detailed in the HPI GI: tolerating PO intake without difficulty Neuro: No numbness, parasthesias, or tingling associated. Otherwise the pertinent positives of the ROS are noted above.   Objective:   BP 100/60   Pulse (!) 55   Temp (!) 97.4 F (36.3 C) (Oral)   Ht 5\' 6"  (  1.676 m)   Wt 180 lb 4 oz (81.8 kg)   BMI 29.09 kg/m    GEN: WDWN, NAD, Non-toxic, Alert & Oriented x 3 HEENT: Atraumatic, Normocephalic.  Ears and Nose: No external deformity. EXTR: No clubbing/cyanosis/edema NEURO: Normal gait. walker PSYCH: Normally interactive. Conversant. Not depressed or anxious appearing.  Calm demeanor.    Right side has full range or approaching full range of abduction as well as some approximately 30 degrees of rotational movements with hip  flexed to 90.  Left hip has grossly full abduction as well and has approximately 25-30 degrees of rotational movement with the hip flexed to 90.  There is no pain at the trochanteric bursa.  Strength at the hip in all directions is 4+ to 5 out of 5 Neurovascularly intact in the lower extremities.  Strength and sensation.  Patient does have tenderness on the posterior pelvis as well as the SI joints, more on the left, and he also has pain in the paraspinous region from L2 through S1 bilaterally.  Radiology:  Assessment and Plan:   Spinal stenosis, lumbar region, with neurogenic claudication  Exam is not really consistent with hip pathology.  Known spinal stenosis and degenerative changes fairly severe in the lower back, and this seems typical with referred back pain to the buttocks region and posterior hip.  In this 84 year old with multiple medical problems, we will use the above and treatment along with some  heat and gentle range of motion.  Patient Instructions  Aspercreme with 4%.  TENS unit.   Careful, rare use of Alleve.  Take Tylenol/Acetaminophen ES (500mg ) 2 tabs by mouth three times a day max as needed.     Follow-up: No Follow-up on file.  Signed,  Maud Deed. Tyshae Stair, MD   Allergies as of 01/30/2017   No Known Allergies     Medication List        Accurate as of 01/30/17 11:59 PM. Always use your most recent med list.          acetaminophen 500 MG tablet Commonly known as:  TYLENOL Take 500 mg by mouth every 6 (six) hours as needed (pain).   albuterol 108 (90 Base) MCG/ACT inhaler Commonly known as:  PROVENTIL HFA;VENTOLIN HFA Inhale 2 puffs into the lungs every 6 (six) hours as needed for wheezing or shortness of breath.   cholecalciferol 1000 units tablet Commonly known as:  VITAMIN D Take 1,000 Units by mouth daily.   clopidogrel 75 MG tablet Commonly known as:  PLAVIX TAKE ONE TABLET BY MOUTH ONE TIME DAILY   donepezil 5 MG tablet Commonly known as:  ARICEPT Take 1 tablet (5 mg total) by mouth at bedtime.   FLUoxetine 10 MG tablet Commonly known as:  PROZAC Take 1 tablet (10 mg total) by mouth daily.   isosorbide mononitrate 120 MG 24 hr tablet Commonly known as:  IMDUR Take 1 tablet (120 mg total) by mouth at bedtime.   metoprolol succinate 50 MG 24 hr tablet Commonly known as:  TOPROL-XL Take 1 tablet (50 mg total) by mouth daily. Take with or immediately following a meal.   NEPHRO-VITE PO Take 1 tablet by mouth daily.   nitroGLYCERIN 0.4 MG/SPRAY spray Commonly known as:  NITROLINGUAL Place 1 spray under the tongue every 5 (five) minutes x 3 doses as needed for chest pain.   OVER THE COUNTER MEDICATION Place 1 drop into both eyes daily as needed (dry eyes). OTC lubricating eye drop   potassium chloride 10  MEQ tablet Commonly known as:  K-DUR,KLOR-CON TAKE TWO TABLETS BY MOUTH DAILY   pravastatin 40 MG tablet Commonly known as:   PRAVACHOL Take 1 tablet (40 mg total) by mouth at bedtime.   PRESERVISION/LUTEIN Caps Take 1 capsule by mouth 2 (two) times daily.   torsemide 20 MG tablet Commonly known as:  DEMADEX Take 1 tablet (20 mg total) by mouth daily.

## 2017-02-13 ENCOUNTER — Encounter: Payer: Self-pay | Admitting: Internal Medicine

## 2017-02-13 ENCOUNTER — Other Ambulatory Visit (INDEPENDENT_AMBULATORY_CARE_PROVIDER_SITE_OTHER): Payer: Medicare Other

## 2017-02-13 ENCOUNTER — Ambulatory Visit (INDEPENDENT_AMBULATORY_CARE_PROVIDER_SITE_OTHER): Payer: Medicare Other | Admitting: Internal Medicine

## 2017-02-13 VITALS — BP 110/58 | HR 55 | Ht 67.0 in | Wt 180.0 lb

## 2017-02-13 DIAGNOSIS — E875 Hyperkalemia: Secondary | ICD-10-CM

## 2017-02-13 DIAGNOSIS — R0609 Other forms of dyspnea: Secondary | ICD-10-CM

## 2017-02-13 DIAGNOSIS — J45991 Cough variant asthma: Secondary | ICD-10-CM | POA: Insufficient documentation

## 2017-02-13 DIAGNOSIS — R918 Other nonspecific abnormal finding of lung field: Secondary | ICD-10-CM | POA: Diagnosis not present

## 2017-02-13 DIAGNOSIS — T50905A Adverse effect of unspecified drugs, medicaments and biological substances, initial encounter: Secondary | ICD-10-CM

## 2017-02-13 LAB — CBC WITH DIFFERENTIAL/PLATELET
BASOS ABS: 0.1 10*3/uL (ref 0.0–0.1)
Basophils Relative: 1.2 % (ref 0.0–3.0)
EOS PCT: 5.9 % — AB (ref 0.0–5.0)
Eosinophils Absolute: 0.5 10*3/uL (ref 0.0–0.7)
HEMATOCRIT: 46.5 % (ref 39.0–52.0)
HEMOGLOBIN: 15.5 g/dL (ref 13.0–17.0)
LYMPHS ABS: 1.9 10*3/uL (ref 0.7–4.0)
LYMPHS PCT: 25.2 % (ref 12.0–46.0)
MCHC: 33.4 g/dL (ref 30.0–36.0)
MCV: 102.2 fl — AB (ref 78.0–100.0)
MONOS PCT: 10.6 % (ref 3.0–12.0)
Monocytes Absolute: 0.8 10*3/uL (ref 0.1–1.0)
Neutro Abs: 4.4 10*3/uL (ref 1.4–7.7)
Neutrophils Relative %: 57.1 % (ref 43.0–77.0)
Platelets: 152 10*3/uL (ref 150.0–400.0)
RBC: 4.55 Mil/uL (ref 4.22–5.81)
RDW: 14.6 % (ref 11.5–15.5)
WBC: 7.7 10*3/uL (ref 4.0–10.5)

## 2017-02-13 LAB — SEDIMENTATION RATE: SED RATE: 4 mm/h (ref 0–20)

## 2017-02-13 LAB — BASIC METABOLIC PANEL
BUN: 34 mg/dL — AB (ref 6–23)
CHLORIDE: 106 meq/L (ref 96–112)
CO2: 26 meq/L (ref 19–32)
CREATININE: 1.57 mg/dL — AB (ref 0.40–1.50)
Calcium: 9.1 mg/dL (ref 8.4–10.5)
GFR: 44.14 mL/min — ABNORMAL LOW (ref >60.00)
Glucose, Bld: 86 mg/dL (ref 70–99)
POTASSIUM: 5.3 meq/L — AB (ref 3.5–5.1)
Sodium: 141 mEq/L (ref 135–145)

## 2017-02-13 LAB — TSH: TSH: 1.28 u[IU]/mL (ref 0.35–4.50)

## 2017-02-13 LAB — BRAIN NATRIURETIC PEPTIDE: Pro B Natriuretic peptide (BNP): 791 pg/mL — ABNORMAL HIGH (ref 0.0–100.0)

## 2017-02-13 MED ORDER — BUDESONIDE-FORMOTEROL FUMARATE 80-4.5 MCG/ACT IN AERO: 2.0000 | INHALATION_SPRAY | Freq: Two times a day (BID) | RESPIRATORY_TRACT | 0 refills | Status: DC

## 2017-02-13 MED ORDER — BUDESONIDE-FORMOTEROL FUMARATE 80-4.5 MCG/ACT IN AERO: 2.0000 | INHALATION_SPRAY | Freq: Two times a day (BID) | RESPIRATORY_TRACT | 11 refills | Status: AC

## 2017-02-13 MED ORDER — PANTOPRAZOLE SODIUM 40 MG PO TBEC: 40.0000 mg | DELAYED_RELEASE_TABLET | Freq: Every day | ORAL | 2 refills | Status: DC

## 2017-02-13 MED ORDER — FAMOTIDINE 20 MG PO TABS: ORAL_TABLET | ORAL | 2 refills | Status: AC

## 2017-02-13 NOTE — Assessment & Plan Note (Signed)
02/13/2017  After extensive coaching inhaler device  effectiveness =    50% > try symbicort 80 2bid   ddx  = cough variant asthma (suggested by ? resp to ventolin vs Upper airway cough syndrome (previously labeled PNDS),  is so named because it's frequently impossible to sort out how much is  CR/sinusitis with freq throat clearing (which can be related to primary GERD)   vs  causing  secondary (" extra esophageal")  GERD from wide swings in gastric pressure that occur with throat clearing, often  promoting self use of mint and menthol lozenges that reduce the lower esophageal sphincter tone and exacerbate the problem further in a cyclical fashion.   These are the same pts (now being labeled as having "irritable larynx syndrome" by some cough centers) who not infrequently have a history of having failed to tolerate ace inhibitors,  dry powder inhalers or biphosphonates or report having atypical/extraesophageal reflux symptoms that don't respond to standard doses of PPI  and are easily confused as having aecopd or asthma flares by even experienced allergists/ pulmonologists (myself included).    rec try symb 80 2bid and gerd rx/ diet > f/u in 4 weeks

## 2017-02-13 NOTE — Assessment & Plan Note (Addendum)
Echo 10/11/16 Since the last study on 06/21/2016 LVEF has decreased from 60-65%   to 40-45% with diffuse hypokinesis and akinesis of the basal   inferior wall.   There is moderate aortic stenosis and moderate regurgitation.   Severe pulmonary hypertension. 02/13/2017   Walked RA x one lap @ 185 stopped due to  Fatigue > sob/ no desat, peak HR 60

## 2017-02-13 NOTE — Assessment & Plan Note (Addendum)
Onset chronic cough mid summer 2018  - speech therapy eval 10/12/16  Ok  - CT chest 01/23/17 c/w migrating infiltrates    ddx  Miscellaneous:Alv microlithiasis, alv proteinosis, Aspiration, bronchiectais, BOOP   ARDS/ AIP Occupational dz/ HSP Neoplasm - much  less likely with some areas better on serial ct  Infection >? Partially treated pna, esp aspiration mechanism  Drug - none seen on mar review Pulmonary emboli, Protein disorders Edema > suggested by elevated BNP/ abn echo but does not usually cause air bronchograms Eosinophilic dz Sarcoidosis Connective tissue dz Hist X / Hemorrhage Idiopathic    Will check labs, observe serially and rx for cough and f/u in 1 m  Total time devoted to counseling  > 50 % of initial 60 min office visit:  review case with pt/wife, daughter retired Psychologist, clinical,  discussion of options/alternatives/ personally creating written customized instructions  in presence of pt  then going over those specific  Instructions directly with the pt including how to use all of the meds but in particular covering each new medication in detail and the difference between the maintenance= "automatic" meds and the prns using an action plan format for the latter (If this problem/symptom => do that organization reading Left to right).  Please see AVS from this visit for a full list of these instructions which I personally wrote for this pt and  are unique to this visit.

## 2017-02-13 NOTE — Progress Notes (Signed)
Subjective:     Patient ID: Andrew [name], male   DOB: Aug 21, 1923,   MRN: [id number]  HPI   21 yowm  Quit smoking 1963 with indolent  onset of cough Mid summer 2018 much worse moved in to newly painted in Aug 2018 at [facility]  And worse after Sept 2018 ? Some better with  Abx/ ? Some ventolin and referred to pulmonary clinic 02/13/2017 by Dr   Edilia Bo for abnormal  Ct    02/13/2017 1st [facility] office visit/ Andrew [name]   Chief Complaint  Patient presents with  . Advice Only    referred Dr Lillia Pauls 2 abnormal CT scanshas had non productive cough, uses albuterol inhaler  fm has noted memory problems since onset of cough  Cough is worst x 15 min p hs / ? Ventolin helps / dry  Doe worse x 50 ft  Sleeps on 2 pillows ok after 1st 15 min No worse with meals "most of the time" and never coughs up food or brings up food   No obvious day to day or daytime variability or assoc excess/ purulent sputum or mucus plugs or hemoptysis or cp or chest tightness, subjective wheeze or overt sinus or hb symptoms. No unusual exposure hx or h/o childhood pna/ asthma or knowledge of premature birth.  Sleeping ok 2 pillows without nocturnal  or early am exacerbation  of respiratory  c/o's or need for noct saba. Also denies any obvious fluctuation of symptoms with weather or environmental changes or other aggravating or alleviating factors except as outlined above   Current Allergies, Complete Past Medical History, Past Surgical History, Family History, and Social History were reviewed in [employer] record.  ROS  The following are not active complaints unless bolded Hoarseness, sore throat, dysphagia, dental problems, itching, sneezing,  nasal congestion or discharge of excess mucus or purulent secretions, ear ache,   fever, chills, sweats, unintended wt loss or wt gain, classically pleuritic or exertional cp,  orthopnea pnd or leg swelling, presyncope, palpitations, abdominal pain,  anorexia, nausea, vomiting, diarrhea  or change in bowel habits or change in bladder habits, change in stools or change in urine, dysuria, hematuria,  rash, arthralgias, visual complaints, headache, numbness, weakness or ataxia or problems with walking or coordination,  change in mood/affect or memory.        Current Meds  Medication Sig  . acetaminophen (TYLENOL) 500 MG tablet Take 500 mg by mouth every 6 (six) hours as needed (pain).  Marland Kitchen albuterol (PROVENTIL HFA;VENTOLIN HFA) 108 (90 Base) MCG/ACT inhaler Inhale 2 puffs into the lungs every 6 (six) hours as needed for wheezing or shortness of breath.  . B Complex-C-Folic Acid (NEPHRO-VITE PO) Take 1 tablet by mouth daily.    . cholecalciferol (VITAMIN D) 1000 units tablet Take 1,000 Units by mouth daily.  . clopidogrel (PLAVIX) 75 MG tablet TAKE ONE TABLET BY MOUTH ONE TIME DAILY   . FLUoxetine (PROZAC) 10 MG tablet Take 1 tablet (10 mg total) by mouth daily.  . isosorbide mononitrate (IMDUR) 120 MG 24 hr tablet Take 1 tablet (120 mg total) by mouth at bedtime.  . metoprolol succinate (TOPROL-XL) 50 MG 24 hr tablet Take 1 tablet (50 mg total) by mouth daily. Take with or immediately following a meal.  . Multiple Vitamins-Minerals (PRESERVISION/LUTEIN) CAPS Take 1 capsule by mouth 2 (two) times daily.  . nitroGLYCERIN (NITROLINGUAL) 0.4 MG/SPRAY spray Place 1 spray under the tongue every 5 (five) minutes x 3 doses  as needed for chest pain.  Marland Kitchen OVER THE COUNTER MEDICATION Place 1 drop into both eyes daily as needed (dry eyes). OTC lubricating eye drop  . potassium chloride (K-DUR,KLOR-CON) 10 MEQ tablet TAKE TWO TABLETS BY MOUTH DAILY   . pravastatin (PRAVACHOL) 40 MG tablet Take 1 tablet (40 mg total) by mouth at bedtime.  . torsemide (DEMADEX) 20 MG tablet Take 1 tablet (20 mg total) by mouth daily.  . [DISCONTINUED] donepezil (ARICEPT) 5 MG tablet Take 1 tablet (5 mg total) by mouth at bedtime.            Review of Systems      Objective:   Physical Exam   Elderly wm learing on rollator on arrival / nad   Wt Readings from Last 3 Encounters:  02/13/17 180 lb (81.6 kg)  01/30/17 180 lb 4 oz (81.8 kg)  01/17/17 181 lb 4 oz (82.2 kg)     Vital signs reviewed - Note on arrival 02 sats  95% on RA      HEENT: nl dentition, turbinates bilaterally, and oropharynx. Nl external ear canals without cough reflex   NECK :  without JVD/Nodes/TM/ nl carotid upstrokes bilaterally   LUNGS: no acc muscle use,  Nl contour chest with minimal insp and exp rhonchi bilaterally ? Cough urge on deep inspiration   CV:  RRR  no s3 or murmur or increase in P2, and no edema   ABD:  soft and nontender with nl inspiratory excursion in the supine position. No bruits or organomegaly appreciated, bowel sounds nl  MS:  Slow gait on rollator/  ext warm without deformities, calf tenderness, cyanosis or clubbing No obvious joint restrictions   SKIN: warm and dry without lesions    NEURO:  alert, approp, nl sensorium with  no motor or cerebellar deficits apparent.     I personally reviewed images and agree with radiology impression as follows:   Chest CT with contrast 01/23/17 Persistent infiltrates in RIGHT lung, increased in RIGHT upper lobe and improved in RIGHT middle and RIGHT lower lobes since previous exam ; continued assessment until resolution recommended to exclude underlying abnormalities including tumor.    Labs ordered/ reviewed:      Chemistry      Component Value Date/Time   NA 141 02/13/2017 1525   NA 143 11/22/2016 1501   K 5.3 (H) 02/13/2017 1525   CL 106 02/13/2017 1525   CO2 26 02/13/2017 1525   BUN 34 (H) 02/13/2017 1525   BUN 31 11/22/2016 1501   CREATININE 1.57 (H) 02/13/2017 1525      Component Value Date/Time   CALCIUM 9.1 02/13/2017 1525                             Lab Results  Component Value Date   WBC 7.7 02/13/2017   HGB 15.5 02/13/2017   HCT 46.5 02/13/2017   MCV 102.2 (H)  02/13/2017   PLT 152.0 02/13/2017       EOS                        0.5  02/13/2017       Lab Results  Component Value Date   TSH 1.28 02/13/2017     Lab Results  Component Value Date   PROBNP 791.0 (H) 02/13/2017       Lab Results  Component Value Date   ESRSEDRATE 4 02/13/2017           Assessment:

## 2017-02-13 NOTE — Patient Instructions (Signed)
Pantoprazole (protonix) 40 mg   Take  30-60 min before first meal of the day and Pepcid (famotidine)  20 mg one @  bedtime until return to office - this is the best way to tell whether stomach acid is contributing to your problem.    GERD (REFLUX)  is an extremely common cause of respiratory symptoms just like yours , many times with no obvious heartburn at all.    It can be treated with medication, but also with lifestyle changes including elevation of the head of your bed (ideally with 6 inch  bed blocks),  Smoking cessation, avoidance of late meals, excessive alcohol, and avoid fatty foods, chocolate, peppermint, colas, red wine, and acidic juices such as orange juice.  NO MINT OR MENTHOL PRODUCTS SO NO COUGH DROPS  USE SUGARLESS CANDY INSTEAD (Jolley ranchers or Stover's or Life Savers) or even ice chips will also do - the key is to swallow to prevent all throat clearing. NO OIL BASED VITAMINS - use powdered substitutes.    Plan A = Automatic = Symbicort 80 Take 2 puffs first thing in am and then another 2 puffs about 12 hours later.   Work on inhaler technique:  relax and gently blow all the way out then take a nice smooth deep breath back in, triggering the inhaler at same time you start breathing in(or one second delay with spacer) .  Hold for up to 5 seconds if you can. Blow out thru nose. Rinse and gargle with water when done     Plan B = Backup Only use your albuterol as a rescue medication to be used if you can't catch your breath by resting or doing a relaxed purse lip breathing pattern.  - The less you use it, the better it will work when you need it. - Ok to use the inhaler up to 2 puffs  every 4 hours if you must but call for appointment if use goes up over your usual need - Don't leave home without it !!  (think of it like the spare tire for your car)   Migrating infiltrates are typical of BOOP or recurrent aspiration   Please remember to go to the lab department downstairs in  the basement  for your tests - we will call you with the results when they are available.      Please schedule a follow up office visit in 4 weeks, sooner if needed  With cxr on return

## 2017-02-14 ENCOUNTER — Telehealth: Payer: Self-pay | Admitting: Internal Medicine

## 2017-02-14 DIAGNOSIS — E875 Hyperkalemia: Secondary | ICD-10-CM | POA: Insufficient documentation

## 2017-02-14 LAB — RESPIRATORY ALLERGY PROFILE REGION II ~~LOC~~
Allergen, A. alternata, m6: <0.1 kU/L
Allergen, Comm Silver Birch, t9: <0.1 kU/L
Allergen, Cottonwood, t14: <0.1 kU/L
Allergen, Mouse Urine Protein, e78: <0.1 kU/L
Allergen, Oak,t7: <0.1 kU/L
Bermuda Grass: <0.1 kU/L
Box Elder IgE: <0.1 kU/L
CLADOSPORIUM HERBARUM (M2) IGE: <0.1 kU/L
CLASS: 0
CLASS: 0
CLASS: 0
CLASS: 0
CLASS: 0
CLASS: 0
CLASS: 0
CLASS: 0
CLASS: 0
Class: 0
Class: 0
Class: 0
Class: 0
Class: 0
Class: 0
Class: 0
Class: 0
Class: 0
Class: 0
Class: 0
Class: 0
Class: 0
Class: 0
Class: 0
Cockroach: <0.1 kU/L
Dog Dander: <0.1 kU/L
Elm IgE: <0.1 kU/L
IgE (Immunoglobulin E), Serum: 128 kU/L — ABNORMAL HIGH (ref <=114)
Johnson Grass: <0.1 kU/L
Sheep Sorrel IgE: <0.1 kU/L
Timothy Grass: <0.1 kU/L

## 2017-02-14 LAB — INTERPRETATION:

## 2017-02-14 LAB — D-DIMER, QUANTITATIVE: D-Dimer, Quant: 0.97 mcg/mL FEU — ABNORMAL HIGH (ref <0.50)

## 2017-02-14 NOTE — Progress Notes (Signed)
LMTCB for Andrew [name], pt's daughter

## 2017-02-14 NOTE — Assessment & Plan Note (Signed)
K 5.3 on Kcl 10 meq bid > decrease to one daily

## 2017-02-14 NOTE — Telephone Encounter (Signed)
Notes recorded by Tanda Rockers, MD on 02/14/2017 at 12:14 PM EST Call patient : Studies are unchanged from prior studies x for K a bit high, rec reduce K dose by half\  Called pt and advised message from the provider. Pt understood and verbalized understanding. Nothing further is needed.

## 2017-02-22 ENCOUNTER — Ambulatory Visit (INDEPENDENT_AMBULATORY_CARE_PROVIDER_SITE_OTHER): Payer: Medicare Other | Admitting: Internal Medicine

## 2017-02-22 ENCOUNTER — Encounter: Payer: Self-pay | Admitting: Internal Medicine

## 2017-02-22 VITALS — BP 100/60 | HR 74 | Ht 66.0 in | Wt 180.0 lb

## 2017-02-22 DIAGNOSIS — I5042 Chronic combined systolic (congestive) and diastolic (congestive) heart failure: Secondary | ICD-10-CM

## 2017-02-22 DIAGNOSIS — I350 Nonrheumatic aortic (valve) stenosis: Secondary | ICD-10-CM | POA: Diagnosis not present

## 2017-02-22 DIAGNOSIS — I25118 Atherosclerotic heart disease of native coronary artery with other forms of angina pectoris: Secondary | ICD-10-CM | POA: Diagnosis not present

## 2017-02-22 DIAGNOSIS — J8489 Other specified interstitial pulmonary diseases: Secondary | ICD-10-CM

## 2017-02-22 DIAGNOSIS — E785 Hyperlipidemia, unspecified: Secondary | ICD-10-CM

## 2017-02-22 DIAGNOSIS — E875 Hyperkalemia: Secondary | ICD-10-CM | POA: Diagnosis not present

## 2017-02-22 NOTE — Progress Notes (Addendum)
Follow-up Outpatient Visit Date: 02/22/2017  Primary Care Provider: [name], MD [place] [id number]  Chief Complaint: Fatigue  HPI:  [name] is a 84 y.o. year-old male with history of  CAD status post remote CABG, moderate aortic stenosis, chronic diastolic heart failure, chronic kidney disease, and stroke,, who presents for follow-up of coronary artery disease and heart failure. I last saw [name] in 11/2016 following preceding hospitalization for NSTEMI and possible pneumonia. Cardiac catheterization was deferred due to his age and comorbidities, including rneal insufficiency. At our last visit, we decreased [name] torsemide out of concern that he was becoming dehydrated. Today, [name] is accompanied by his wife and daughter, who provide most of the history. He continues to have significant fatigue and sleeps during much of the day. He has not been walking much or doing physical therapy. He had one episode of chest pain that resolved promptly with sublingual nitroglycerin. He has stable shortness of breath and is currently being worked up for possible BOOP by pulmonary.  [name] has not had any significant swelling. He has continued to lose weight despite having a good appetite. Potassium supplementation was decreased by Dr. Melvyn Andrew [name] recently due to mild hyperkalemia.Marland Kitchen  --------------------------------------------------------------------------------------------------  Cardiovascular History & Procedures: Cardiovascular Problems:  Coronary artery disease status post CABG in 1991  Moderate aortic stenosis  Ischemic cardiomyopathy  Risk Factors:  Known coronary artery disease, stroke, hyperlipidemia, male gender, and age greater than 66  Cath/PCI:  LHC (2004): One limb of sequential SVG to OM1, OM2, and OM3 occluded.  CV Surgery:  CABG (1991): LIMA to LAD, SVG to PDA, sequential SVG to OM1, OM2, and OM3.  EP Procedures and  Devices:  None  Non-Invasive Evaluation(s):  TTE (10/11/16): Normal LV size with moderate LVH.  LVEF 40-45% without regional wall motion abnormalities.  Grade 2 diastolic dysfunction with elevated filling pressure.  Aortic sclerosis with moderate MR.  Mildly dilated a sending aorta.  Mitral annular calcification and mildly thickened mitral valve leaflets with mild MR.  Moderate left atrial enlargement.  Normal RV size and function.  Severe pulmonary hypertension.  TTE (06/21/16): Mildly dilated left ventricle with mild LVH. LVEF 55-60%. Thickened aortic valve with moderately restricted motion. Mean gradient 22 mmHg. Mild aortic regurgitation and mitral regurgitation. Mild left atrial enlargement. Moderate pulmonic regurgitation. Mild pulmonary hypertension. No significant change from prior echo in 2017.  Carotid artery Doppler (05/28/16): 60-79% left and less than 40% right internal carotid artery stenoses.  Recent CV Pertinent Labs: Lab Results  Component Value Date   CHOL 105 03/29/2016   HDL 40.20 03/29/2016   LDLCALC 46 03/29/2016   LDLDIRECT 67.0 03/13/2010   TRIG 97.0 03/29/2016   CHOLHDL 3 03/29/2016   INR 1.11 06/20/2015   BNP 478.7 (H) 10/09/2016   K 5.3 (H) 02/13/2017   MG 2.4 10/14/2016   BUN 34 (H) 02/13/2017   BUN 31 11/22/2016   CREATININE 1.57 (H) 02/13/2017    Past medical and surgical history were reviewed and updated in EPIC.  Current Meds  Medication Sig  . acetaminophen (TYLENOL) 500 MG tablet Take 500 mg by mouth every 6 (six) hours as needed (pain).  Marland Kitchen albuterol (PROVENTIL HFA;VENTOLIN HFA) 108 (90 Base) MCG/ACT inhaler Inhale 2 puffs into the lungs every 6 (six) hours as needed for wheezing or shortness of breath.  . B Complex-C-Folic Acid (NEPHRO-VITE PO) Take 1 tablet by mouth daily.    . budesonide-formoterol (SYMBICORT) 80-4.5 MCG/ACT  inhaler Inhale 2 puffs into the lungs 2 (two) times daily.  . cholecalciferol (VITAMIN D) 1000 units tablet Take 1,000  Units by mouth daily.  . clopidogrel (PLAVIX) 75 MG tablet TAKE ONE TABLET BY MOUTH ONE TIME DAILY   . famotidine (PEPCID) 20 MG tablet One at bedtime  . FLUoxetine (PROZAC) 10 MG tablet Take 1 tablet (10 mg total) by mouth daily.  . isosorbide mononitrate (IMDUR) 120 MG 24 hr tablet Take 1 tablet (120 mg total) by mouth at bedtime.  . metoprolol succinate (TOPROL-XL) 50 MG 24 hr tablet Take 1 tablet (50 mg total) by mouth daily. Take with or immediately following a meal.  . Multiple Vitamins-Minerals (PRESERVISION/LUTEIN) CAPS Take 1 capsule by mouth 2 (two) times daily.  . nitroGLYCERIN (NITROLINGUAL) 0.4 MG/SPRAY spray Place 1 spray under the tongue every 5 (five) minutes x 3 doses as needed for chest pain.  Marland Kitchen OVER THE COUNTER MEDICATION Place 1 drop into both eyes daily as needed (dry eyes). OTC lubricating eye drop  . pantoprazole (PROTONIX) 40 MG tablet Take 1 tablet (40 mg total) by mouth daily. Take 30-60 min before first meal of the day  . pravastatin (PRAVACHOL) 40 MG tablet Take 1 tablet (40 mg total) by mouth at bedtime.  . [DISCONTINUED] potassium chloride (K-DUR,KLOR-CON) 10 MEQ tablet TAKE TWO TABLETS BY MOUTH DAILY     Allergies: Patient has no known allergies.  Social History   Socioeconomic History  . Marital status: Married    Spouse name: Not on file  . Number of children: Not on file  . Years of education: Not on file  . Highest education level: Not on file  Social Needs  . Financial resource strain: Not on file  . Food insecurity - worry: Not on file  . Food insecurity - inability: Not on file  . Transportation needs - medical: Not on file  . Transportation needs - non-medical: Not on file  Occupational History  . 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 Smoking status: Former Smoker    Packs/day: 2.00    Years: 20.00    Pack years: 40.00    Types: Cigarettes    Last attempt to quit: 01/22/1961    Years  since quitting: 56.1  . Smokeless tobacco: Never Used  Substance and Sexual Activity  . Alcohol use: Yes    Alcohol/week: 4.2 oz    Types: 7 Shots of liquor per week    Comment: 1 drink nightly  . Drug use: No  . Sexual activity: No  Other Topics Concern  . Not on file  Social History Narrative  . Not on file    Family History  Problem Relation Age of Onset  . Emphysema Brother   . Lung cancer Brother   . Heart disease Father   . Hypertension Father   . Heart attack Father   . Colon cancer Sister   . Non-Hodgkin's lymphoma Sister   . Heart disease Brother   . Colon cancer Sister   . Skin cancer Sister   . Alcohol abuse Unknown   . Arthritis Unknown   . Cancer Unknown   . Macular degeneration Unknown   . Lung cancer Daughter     Review of Systems: A 12-system review of systems was performed and was negative except as noted in the HPI.  --------------------------------------------------------------------------------------------------  Physical Exam: BP 100/60   Pulse 74  Ht 5\' 6"  (1.676 m)   Wt 180 lb (81.6 kg)   SpO2 96%   BMI 29.05 kg/m   General: Frail, elderly man, seated comfortably in the exam room. He is accompanied by his wife and daughter. HEENT: No conjunctival pallor or scleral icterus. Moist mucous membranes.  OP clear. Neck: Supple without lymphadenopathy, thyromegaly, JVD, or HJR. Lungs: Normal work of breathing. Clear to auscultation bilaterally without wheezes or crackles. Heart: RRR with 2/6 systolic murmur loudest at the RUSB. S1 and S2 present. No rubs or gallops. Non-displaced PMI. Abd: Bowel sounds present. Soft, NT/ND without hepatosplenomegaly Ext: No lower extremity edema. Radial, PT, and DP pulses are 2+ bilaterally. Skin: Warm and dry without rash.  EKG: Poor quality tracing due to motion artifact. NSR with inferolateral T wave inversions. Right axis deviation.  Lab Results  Component Value Date   WBC 7.7 02/13/2017   HGB 15.5  02/13/2017   HCT 46.5 02/13/2017   MCV 102.2 (H) 02/13/2017   PLT 152.0 02/13/2017    Lab Results  Component Value Date   NA 141 02/13/2017   K 5.3 (H) 02/13/2017   CL 106 02/13/2017   CO2 26 02/13/2017   BUN 34 (H) 02/13/2017   CREATININE 1.57 (H) 02/13/2017   GLUCOSE 86 02/13/2017   ALT 25 10/09/2016    Lab Results  Component Value Date   CHOL 105 03/29/2016   HDL 40.20 03/29/2016   LDLCALC 46 03/29/2016   LDLDIRECT 67.0 03/13/2010   TRIG 97.0 03/29/2016   CHOLHDL 3 03/29/2016    --------------------------------------------------------------------------------------------------  ASSESSMENT AND PLAN: Coronary artery disease with stable angina [name] reports a single episode of chest pain that resolved promptly with SL NTG x 1 since our last visit. We have agreed to continue his current antianginal therapy consistent of isosorbide mononitrate and metoprolol and will defer additional testing/intervention in light of his age and comorbidities.  Chronic systolic and diastolic heart failure [name] appears euvolemic. Functional status is primarily limited by marked deconditioning. We willc continue current regimen, including toresemide 20 mg daily. Given recent hyperkalemia, we have agreed to stop potassium supplementation altogether and recheck a BMP in 1-2 weeks.  Aortic stenosis Moderate AS noted by echo. No obvious symptoms to suggest critical AS. [name] is a poor candidate for any valve intervention.  BOOP Currently undergoing evaluation by pulmonary. If systemic steroids are needed, we will need to monitor Andrew [name]'s volume status closely given the propensity for steroids to cause fluid retention. [name] should weigh himself daily and take an additional dose of torsemide 20 mg if he gains > 2 pounds/day or 5 pounds/week.  Hyperkalemia Potassium recently elevated at 5.3, prompting decrease in potassium supplementation from 10 mEq daily to 5 mEq daily. We have agreed  to stop KCl altogether and recheck a BMP in 1-2 weeks.  Hyperlipidemia Goal LDL < 70. Continue pravastatin for secondary prevention.  Follow-up: Return to clinic in 4 months.  Nelva Bush, MD 02/23/2017 8:08 PM

## 2017-02-22 NOTE — Patient Instructions (Addendum)
Medication Instructions:   STOP Potassium Chloride  -- If you need a refill on your cardiac medications before your next appointment, please call your pharmacy. --  Labwork: 03/05/2017  BMP   Testing/Procedures: None ordered  Follow-Up: Your physician wants you to follow-up in: 4 months with Dr. Saunders Revel.     Thank you for choosing [facility]!!    Any Other Special Instructions Will Be Listed Below (If Applicable).

## 2017-02-23 ENCOUNTER — Encounter: Payer: Self-pay | Admitting: Internal Medicine

## 2017-02-23 DIAGNOSIS — I25118 Atherosclerotic heart disease of native coronary artery with other forms of angina pectoris: Secondary | ICD-10-CM | POA: Insufficient documentation

## 2017-02-23 DIAGNOSIS — J8489 Other specified interstitial pulmonary diseases: Secondary | ICD-10-CM | POA: Insufficient documentation

## 2017-02-25 NOTE — Addendum Note (Signed)
Addended by: Mendel Ryder on: 02/25/2017 02:08 PM   Modules accepted: Orders

## 2017-03-05 ENCOUNTER — Other Ambulatory Visit: Payer: Medicare Other

## 2017-03-05 DIAGNOSIS — E875 Hyperkalemia: Secondary | ICD-10-CM

## 2017-03-06 ENCOUNTER — Telehealth: Payer: Self-pay | Admitting: Internal Medicine

## 2017-03-06 ENCOUNTER — Ambulatory Visit: Payer: Medicare Other | Admitting: Family Medicine

## 2017-03-06 ENCOUNTER — Encounter: Payer: Self-pay | Admitting: Family Medicine

## 2017-03-06 ENCOUNTER — Other Ambulatory Visit: Payer: Self-pay

## 2017-03-06 VITALS — BP 120/80 | HR 57 | Temp 98.4°F | Ht 66.0 in | Wt 183.5 lb

## 2017-03-06 DIAGNOSIS — M25552 Pain in left hip: Secondary | ICD-10-CM | POA: Diagnosis not present

## 2017-03-06 DIAGNOSIS — M7062 Trochanteric bursitis, left hip: Secondary | ICD-10-CM

## 2017-03-06 DIAGNOSIS — L03312 Cellulitis of back [any part except buttock]: Secondary | ICD-10-CM | POA: Diagnosis not present

## 2017-03-06 LAB — BASIC METABOLIC PANEL
BUN / CREAT RATIO: 13 (ref 10–24)
BUN: 22 mg/dL (ref 10–36)
CALCIUM: 8.8 mg/dL (ref 8.6–10.2)
CO2: 23 mmol/L (ref 20–29)
CREATININE: 1.64 mg/dL — AB (ref 0.76–1.27)
Chloride: 104 mmol/L (ref 96–106)
GFR calc non Af Amer: 36 mL/min/{1.73_m2} — ABNORMAL LOW (ref >59)
GFR, EST AFRICAN AMERICAN: 41 mL/min/{1.73_m2} — AB (ref >59)
Glucose: 83 mg/dL (ref 65–99)
Potassium: 5.1 mmol/L (ref 3.5–5.2)
Sodium: 142 mmol/L (ref 134–144)

## 2017-03-06 MED ORDER — METHYLPREDNISOLONE ACETATE 40 MG/ML IJ SUSP
80.0000 mg | Freq: Once | INTRAMUSCULAR | Status: AC
  Administered 2017-03-06: 80 mg via INTRA_ARTICULAR

## 2017-03-06 MED ORDER — CEPHALEXIN 500 MG PO CAPS: 500.0000 mg | ORAL_CAPSULE | Freq: Two times a day (BID) | ORAL | 0 refills | Status: DC

## 2017-03-06 NOTE — Telephone Encounter (Signed)
New message ° ° ° °Patient returning call for lab results . Please call °

## 2017-03-06 NOTE — Telephone Encounter (Signed)
Spoke with patient's wife and informed her of lab results and Dr. Darnelle Bos recommendations of drinking more water and refraining from the Potassium Chloride.  She verbalized understanding.

## 2017-03-06 NOTE — Progress Notes (Signed)
Dr. Frederico Hamman T. Hashim Eichhorst, MD, [facility] [place], [address] Phone: [phone] Fax: [phone]  03/06/2017  Patient: Andrew [name], MRN: [id number], DOB: 1923-05-01, 84 y.o.  Primary Physician:  [name], MD   Chief Complaint  Patient presents with  . Hip Pain    Left  . Scratch on Back   Subjective:   Andrew [name] is a 84 y.o. very pleasant male patient who presents with the following:  L hip pain - troch bursitis on the left.  This is been an ongoing thing for him, it actually is gotten worse.  He has been trying to walk with his walker at all times to avoid potential falls and injury.  He points to the lateral aspect at the greater trochanter and is not having any significant groin pain.  He does have some back pain at baseline.  Scratch on back - keflex.  He had a scratch on his back for about 2 weeks now, there is an area of redness that is worsening.  Is not dramatically tender to palpation.  The have been putting some Neosporin on it.  GTB injection  Past Medical History, Surgical History, Social History, Family History, Problem List, Medications, and Allergies have been reviewed and updated if relevant.  Patient Active Problem List   Diagnosis Date Noted  . Advanced Planning: DNR 04/09/2016    Priority: High  . Right-sided cerebrovascular accident (CVA) [facility])     Priority: High  . Tachycardia-bradycardia syndrome ([place]) 08/19/2012    Priority: High  . PVD- moderate carotid disease 03/07/2010    Priority: High  . Angina, class II ([place]) 06/18/2008    Priority: High  . Hx of CABG 06/18/2008    Priority: High  . Nonrheumatic aortic valve stenosis 06/18/2008    Priority: High  . Chronic renal insufficiency, stage III (moderate) (HCC) 06/02/2014    Priority: Medium  . Macular degeneration (senile) of retina 04/29/2014    Priority: Medium  . TIA (transient ischemic attack) 06/03/2013   Priority: Medium  . COPD, minimal-mild 12/22/2010    Priority: Medium  . Spinal stenosis, lumbar region, with neurogenic claudication 07/13/2009    Priority: Medium  . Coronary artery disease of native heart with stable angina pectoris ([place]) 02/23/2017  . BOOP (bronchiolitis obliterans with organizing pneumonia) ([place]) 02/23/2017  . Hyperkalemia 02/14/2017  . Pulmonary infiltrates on CXR 02/13/2017  . Dyspnea on exertion 02/13/2017  . Cough variant asthma vs UACS 02/13/2017  . Vascular dementia without behavioral disturbance 12/25/2016  . Chronic combined systolic and diastolic heart failure ([place])   . NSTEMI (non-ST elevated myocardial infarction) ([place]) 10/09/2016  . Coronary artery disease of native artery of native heart with stable angina pectoris ([place]) 06/28/2016  . Bilateral carotid artery stenosis 06/28/2016  . Hyperlipidemia LDL goal <70 06/28/2016  . Essential hypertension   . Alcohol abuse   . Thrombocytopenia ([place])   . Major depressive disorder, single episode 06/11/2013  . Pancreatic cyst 01/29/2013  . OSA (obstructive sleep apnea) 12/26/2012  . S/P excision of acoustic neuroma 06/18/2010  . Pulmonary nodule 06/17/2010  . Vertigo 06/14/2010  . THYROID NODULE 03/13/2010  . ALLERGIC RHINITIS 09/01/2008  . COLONIC POLYPS, HX OF 09/01/2008  . HYPERCHOLESTEROLEMIA 06/18/2008  . GASTROESOPHAGEAL REFLUX DISEASE 06/18/2008    Past Medical History:  Diagnosis Date  . Allergic rhinitis   . AORTIC STENOSIS   . BPV (benign positional vertigo)   . CAD,  ARTERY BYPASS GRAFT   . CAROTID ARTERY STENOSIS 03/07/2010   80%  . CHF (congestive heart failure) ([place])   . COPD, mild ([place]) 12/22/2010  . GASTROESOPHAGEAL REFLUX DISEASE   . History of colonic polyps    1999, 2004  . History of prostate cancer 2002   s/p treatment with seeds / radiation  . HYPERCHOLESTEROLEMIA   . Legally blind   . Loss of hearing    left,  . Lumbar spinal stenosis 07/13/2009  . Macular degeneration  (senile) of retina 04/29/2014  . Pancreatic cyst 01/29/2013   Noted on MRI scan from [facility] on 07/27/2011.  I reviewed report at patient request on 01-29-2013.  "Small cystic focus in the posterior pancreatic head.  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  Follow up MRI is advised. The main pancreatic duct remains normal in appearance."  Patient do  . S/P excision of acoustic neuroma   . Skin cancer   . Stroke ([place])   . Thyroid nodule 05/2010   Abnormal biopsy, 84 year old patient and his wife have made informed decision to not pursue surgical resection. Potential risk including cancer has been thoroughly discussed with the patient.    Past Surgical History:  Procedure Laterality Date  . CATARACT EXTRACTION    . CATARACT EXTRACTION  2010  . CHOLECYSTECTOMY    . CORONARY ARTERY BYPASS GRAFT    . CRANIECTOMY FOR EXCISION OF ACOUSTIC NEUROMA  1985  . EYE SURGERY    . SHOULDER SURGERY    . TONSILLECTOMY AND ADENOIDECTOMY  1932  . TOTAL KNEE ARTHROPLASTY    . transperineal implatation of palladium      Social History   Socioeconomic History  . Marital status: Married    Spouse name: Not on file  . Number of children: Not on file  . Years of education: Not on file  . Highest education level: Not on file  Social Needs  . Financial resource strain: Not on file  . Food insecurity - worry: Not on file  . Food insecurity - inability: Not on file  . Transportation needs - medical: Not on file  . Transportation needs - non-medical: Not on file  Occupational History  . 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 Smoking status: Former Smoker    Packs/day: 2.00    Years: 20.00    Pack years: 40.00    Types: Cigarettes    Last attempt to quit: 01/22/1961    Years since quitting: 56.1  . Smokeless tobacco: Never Used  Substance and  Sexual Activity  . Alcohol use: Yes    Alcohol/week: 4.2 oz    Types: 7 Shots of liquor per week    Comment: 1 drink nightly  . Drug use: No  . Sexual activity: No  Other Topics Concern  . Not on file  Social History Narrative  . Not on file    Family History  Problem Relation Age of Onset  . Emphysema Brother   . Lung cancer Brother   . Heart disease Father   . Hypertension Father   . Heart attack Father   . Colon cancer Sister   . Non-Hodgkin's lymphoma Sister   . Heart disease Brother   . Colon cancer Sister   . Skin cancer Sister   . Alcohol abuse Unknown   .  Arthritis Unknown   . Cancer Unknown   . Macular degeneration Unknown   . Lung cancer Daughter     No Known Allergies  Medication list reviewed and updated in full in [place].  GEN: No fevers, chills. Nontoxic. Primarily MSK c/o today. MSK: Detailed in the HPI GI: tolerating PO intake without difficulty Neuro: No numbness, parasthesias, or tingling associated. Otherwise the pertinent positives of the ROS are noted above.   Objective:   BP 120/80   Pulse (!) 57   Temp 98.4 F (36.9 C) (Oral)   Ht 5\' 6"  (1.676 m)   Wt 183 lb 8 oz (83.2 kg)   BMI 29.62 kg/m    GEN: WDWN, NAD, Non-toxic, Alert & Oriented x 3 HEENT: Atraumatic, Normocephalic.  Ears and Nose: No external deformity. EXTR: No clubbing/cyanosis/edema NEURO: using walker PSYCH: Normally interactive. Conversant. Not depressed or anxious appearing.  Calm demeanor.   HIP EXAM: SIDE: LEFT ROM: Abduction, Flexion, Internal and External range of motion: Approximate 20% loss of motion compared to expected. Pain with terminal IROM and EROM: Mild pain with terminal external range of motion. GTB: TTP SLR: NEG Knees: No effusion FABER: NT REVERSE FABER: NT, neg Piriformis: NT at direct palpation Str: flexion: 4/5 abduction: 4/5 adduction: 4/5 Strength testing non-tender  On his back there is also a small scratch with a  surrounding area of redness that is larger than a half dollar.  Radiology: No results found.  Assessment and Plan:   Trochanteric bursitis, left hip  Left hip pain - Plan: methylPREDNISolone acetate (DEPO-MEDROL) injection 80 mg  Cellulitis of back  Classic trochanteric bursitis, and we will treat this in a gentleman with multiple medical comorbidities, 84 years old, chronic kidney disease with a injection of Depo-Medrol directly into the bursa.  I am concerned that the scratch on his back is started to get infected, some in a place him on some Keflex.  Trochanteric Bursitis Injection, L Verbal consent obtained. Risks (including infection, potential atrophy), benefits, and alternatives reviewed. Greater trochanter sterilely prepped with Chloraprep. Ethyl Chloride used for anesthesia. 8 cc of Lidocaine 1% injected with 2 mL of Depo-Medrol 40 mg into trochanteric bursa at area of maximal tenderness at greater trochanter. Needle taken to bone to troch bursa, flows easily. Bursa massaged. No bleeding and no complications. Decreased pain after injection. Needle: 22 gauge spinal needle   Follow-up: No Follow-up on file.  Meds ordered this encounter  Medications  . cephALEXin (KEFLEX) 500 MG capsule    Sig: Take 1 capsule (500 mg total) by mouth 2 (two) times daily for 7 days.    Dispense:  14 capsule    Refill:  0  . methylPREDNISolone acetate (DEPO-MEDROL) injection 80 mg   Signed,  Garret Teale T. Rolanda Campa, MD   Allergies as of 03/06/2017   No Known Allergies     Medication List        Accurate as of 03/06/17 11:59 PM. Always use your most recent med list.          acetaminophen 500 MG tablet Commonly known as:  TYLENOL Take 500 mg by mouth every 6 (six) hours as needed (pain).   albuterol 108 (90 Base) MCG/ACT inhaler Commonly known as:  PROVENTIL HFA;VENTOLIN HFA Inhale 2 puffs into the lungs every 6 (six) hours as needed for wheezing or shortness of breath.     budesonide-formoterol 80-4.5 MCG/ACT inhaler Commonly known as:  SYMBICORT Inhale 2 puffs into the lungs 2 (two) times daily.  cephALEXin 500 MG capsule Commonly known as:  KEFLEX Take 1 capsule (500 mg total) by mouth 2 (two) times daily for 7 days.   cholecalciferol 1000 units tablet Commonly known as:  VITAMIN D Take 1,000 Units by mouth daily.   clopidogrel 75 MG tablet Commonly known as:  PLAVIX TAKE ONE TABLET BY MOUTH ONE TIME DAILY   famotidine 20 MG tablet Commonly known as:  PEPCID One at bedtime   FLUoxetine 10 MG tablet Commonly known as:  PROZAC Take 1 tablet (10 mg total) by mouth daily.   isosorbide mononitrate 120 MG 24 hr tablet Commonly known as:  IMDUR Take 1 tablet (120 mg total) by mouth at bedtime.   metoprolol succinate 50 MG 24 hr tablet Commonly known as:  TOPROL-XL Take 1 tablet (50 mg total) by mouth daily. Take with or immediately following a meal.   NEPHRO-VITE PO Take 1 tablet by mouth daily.   nitroGLYCERIN 0.4 MG/SPRAY spray Commonly known as:  NITROLINGUAL Place 1 spray under the tongue every 5 (five) minutes x 3 doses as needed for chest pain.   OVER THE COUNTER MEDICATION Place 1 drop into both eyes daily as needed (dry eyes). OTC lubricating eye drop   pantoprazole 40 MG tablet Commonly known as:  PROTONIX Take 1 tablet (40 mg total) by mouth daily. Take 30-60 min before first meal of the day   pravastatin 40 MG tablet Commonly known as:  PRAVACHOL Take 1 tablet (40 mg total) by mouth at bedtime.   PRESERVISION/LUTEIN Caps Take 1 capsule by mouth 2 (two) times daily.   torsemide 20 MG tablet Commonly known as:  DEMADEX Take 1 tablet (20 mg total) by mouth daily.

## 2017-03-07 ENCOUNTER — Other Ambulatory Visit: Payer: Self-pay

## 2017-03-07 ENCOUNTER — Encounter (HOSPITAL_COMMUNITY): Payer: Self-pay | Admitting: Emergency Medicine

## 2017-03-07 ENCOUNTER — Inpatient Hospital Stay (HOSPITAL_COMMUNITY)
Admission: EM | Admit: 2017-03-07 | Discharge: 2017-03-10 | DRG: 281 | Disposition: A | Discharge status: Home Health | Payer: Medicare Other | Attending: Internal Medicine | Admitting: Internal Medicine

## 2017-03-07 ENCOUNTER — Emergency Department (HOSPITAL_COMMUNITY): Payer: Medicare Other

## 2017-03-07 DIAGNOSIS — H9190 Unspecified hearing loss, unspecified ear: Secondary | ICD-10-CM | POA: Diagnosis present

## 2017-03-07 DIAGNOSIS — K219 Gastro-esophageal reflux disease without esophagitis: Secondary | ICD-10-CM | POA: Diagnosis present

## 2017-03-07 DIAGNOSIS — H3530 Unspecified macular degeneration: Secondary | ICD-10-CM | POA: Diagnosis present

## 2017-03-07 DIAGNOSIS — I25119 Atherosclerotic heart disease of native coronary artery with unspecified angina pectoris: Secondary | ICD-10-CM | POA: Diagnosis not present

## 2017-03-07 DIAGNOSIS — J449 Chronic obstructive pulmonary disease, unspecified: Secondary | ICD-10-CM | POA: Diagnosis present

## 2017-03-07 DIAGNOSIS — Z8249 Family history of ischemic heart disease and other diseases of the circulatory system: Secondary | ICD-10-CM

## 2017-03-07 DIAGNOSIS — I350 Nonrheumatic aortic (valve) stenosis: Secondary | ICD-10-CM | POA: Diagnosis present

## 2017-03-07 DIAGNOSIS — I130 Hypertensive heart and chronic kidney disease with heart failure and stage 1 through stage 4 chronic kidney disease, or unspecified chronic kidney disease: Secondary | ICD-10-CM | POA: Diagnosis present

## 2017-03-07 DIAGNOSIS — Z87891 Personal history of nicotine dependence: Secondary | ICD-10-CM

## 2017-03-07 DIAGNOSIS — I447 Left bundle-branch block, unspecified: Secondary | ICD-10-CM

## 2017-03-07 DIAGNOSIS — I110 Hypertensive heart disease with heart failure: Secondary | ICD-10-CM | POA: Diagnosis not present

## 2017-03-07 DIAGNOSIS — H548 Legal blindness, as defined in USA: Secondary | ICD-10-CM | POA: Diagnosis present

## 2017-03-07 DIAGNOSIS — E785 Hyperlipidemia, unspecified: Secondary | ICD-10-CM | POA: Diagnosis present

## 2017-03-07 DIAGNOSIS — I712 Thoracic aortic aneurysm, without rupture: Secondary | ICD-10-CM | POA: Diagnosis present

## 2017-03-07 DIAGNOSIS — I2510 Atherosclerotic heart disease of native coronary artery without angina pectoris: Secondary | ICD-10-CM | POA: Diagnosis present

## 2017-03-07 DIAGNOSIS — I700 Atherosclerosis of aorta: Secondary | ICD-10-CM | POA: Diagnosis present

## 2017-03-07 DIAGNOSIS — N183 Chronic kidney disease, stage 3 unspecified: Secondary | ICD-10-CM | POA: Diagnosis present

## 2017-03-07 DIAGNOSIS — I249 Acute ischemic heart disease, unspecified: Secondary | ICD-10-CM | POA: Diagnosis not present

## 2017-03-07 DIAGNOSIS — Z7982 Long term (current) use of aspirin: Secondary | ICD-10-CM

## 2017-03-07 DIAGNOSIS — Z8546 Personal history of malignant neoplasm of prostate: Secondary | ICD-10-CM

## 2017-03-07 DIAGNOSIS — I252 Old myocardial infarction: Secondary | ICD-10-CM

## 2017-03-07 DIAGNOSIS — Z85828 Personal history of other malignant neoplasm of skin: Secondary | ICD-10-CM

## 2017-03-07 DIAGNOSIS — I5043 Acute on chronic combined systolic (congestive) and diastolic (congestive) heart failure: Secondary | ICD-10-CM

## 2017-03-07 DIAGNOSIS — Z951 Presence of aortocoronary bypass graft: Secondary | ICD-10-CM

## 2017-03-07 DIAGNOSIS — D72829 Elevated white blood cell count, unspecified: Secondary | ICD-10-CM | POA: Diagnosis present

## 2017-03-07 DIAGNOSIS — Z7951 Long term (current) use of inhaled steroids: Secondary | ICD-10-CM

## 2017-03-07 DIAGNOSIS — Z79899 Other long term (current) drug therapy: Secondary | ICD-10-CM

## 2017-03-07 DIAGNOSIS — I5042 Chronic combined systolic (congestive) and diastolic (congestive) heart failure: Secondary | ICD-10-CM | POA: Diagnosis present

## 2017-03-07 DIAGNOSIS — I340 Nonrheumatic mitral (valve) insufficiency: Secondary | ICD-10-CM | POA: Diagnosis not present

## 2017-03-07 DIAGNOSIS — Z7902 Long term (current) use of antithrombotics/antiplatelets: Secondary | ICD-10-CM

## 2017-03-07 DIAGNOSIS — N179 Acute kidney failure, unspecified: Secondary | ICD-10-CM | POA: Diagnosis not present

## 2017-03-07 DIAGNOSIS — Z66 Do not resuscitate: Secondary | ICD-10-CM | POA: Diagnosis present

## 2017-03-07 DIAGNOSIS — Z8673 Personal history of transient ischemic attack (TIA), and cerebral infarction without residual deficits: Secondary | ICD-10-CM | POA: Diagnosis not present

## 2017-03-07 DIAGNOSIS — F0390 Unspecified dementia without behavioral disturbance: Secondary | ICD-10-CM | POA: Diagnosis present

## 2017-03-07 DIAGNOSIS — G4733 Obstructive sleep apnea (adult) (pediatric): Secondary | ICD-10-CM | POA: Diagnosis present

## 2017-03-07 DIAGNOSIS — J8489 Other specified interstitial pulmonary diseases: Secondary | ICD-10-CM | POA: Diagnosis present

## 2017-03-07 DIAGNOSIS — I214 Non-ST elevation (NSTEMI) myocardial infarction: Secondary | ICD-10-CM | POA: Diagnosis present

## 2017-03-07 DIAGNOSIS — Z96659 Presence of unspecified artificial knee joint: Secondary | ICD-10-CM | POA: Diagnosis present

## 2017-03-07 DIAGNOSIS — D696 Thrombocytopenia, unspecified: Secondary | ICD-10-CM | POA: Diagnosis present

## 2017-03-07 LAB — CBC WITH DIFFERENTIAL/PLATELET
BASOS PCT: 0 %
Basophils Absolute: 0 10*3/uL (ref 0.0–0.1)
EOS ABS: 0 10*3/uL (ref 0.0–0.7)
EOS PCT: 0 %
HCT: 43.5 % (ref 39.0–52.0)
HEMOGLOBIN: 14.6 g/dL (ref 13.0–17.0)
Lymphocytes Relative: 11 %
Lymphs Abs: 1.1 10*3/uL (ref 0.7–4.0)
MCH: 34.3 pg — ABNORMAL HIGH (ref 26.0–34.0)
MCHC: 33.6 g/dL (ref 30.0–36.0)
MCV: 102.1 fL — ABNORMAL HIGH (ref 78.0–100.0)
MONOS PCT: 5 %
Monocytes Absolute: 0.4 10*3/uL (ref 0.1–1.0)
NEUTROS PCT: 84 %
Neutro Abs: 8.3 10*3/uL — ABNORMAL HIGH (ref 1.7–7.7)
PLATELETS: 105 10*3/uL — AB (ref 150–400)
RBC: 4.26 MIL/uL (ref 4.22–5.81)
RDW: 15.4 % (ref 11.5–15.5)
WBC: 9.9 10*3/uL (ref 4.0–10.5)

## 2017-03-07 LAB — BASIC METABOLIC PANEL
Anion gap: 11 (ref 5–15)
BUN: 23 mg/dL — ABNORMAL HIGH (ref 6–20)
CALCIUM: 9 mg/dL (ref 8.9–10.3)
CHLORIDE: 106 mmol/L (ref 101–111)
CO2: 21 mmol/L — ABNORMAL LOW (ref 22–32)
CREATININE: 1.7 mg/dL — AB (ref 0.61–1.24)
GFR calc Af Amer: 38 mL/min — ABNORMAL LOW (ref >60)
GFR calc non Af Amer: 33 mL/min — ABNORMAL LOW (ref >60)
Glucose, Bld: 128 mg/dL — ABNORMAL HIGH (ref 65–99)
Potassium: 4.9 mmol/L (ref 3.5–5.1)
SODIUM: 138 mmol/L (ref 135–145)

## 2017-03-07 LAB — I-STAT CHEM 8, ED
BUN: 35 mg/dL — AB (ref 6–20)
CHLORIDE: 105 mmol/L (ref 101–111)
CREATININE: 1.7 mg/dL — AB (ref 0.61–1.24)
Calcium, Ion: 1.12 mmol/L — ABNORMAL LOW (ref 1.15–1.40)
Glucose, Bld: 143 mg/dL — ABNORMAL HIGH (ref 65–99)
HEMATOCRIT: 44 % (ref 39.0–52.0)
Hemoglobin: 15 g/dL (ref 13.0–17.0)
Potassium: 5.8 mmol/L — ABNORMAL HIGH (ref 3.5–5.1)
SODIUM: 138 mmol/L (ref 135–145)
TCO2: 26 mmol/L (ref 22–32)

## 2017-03-07 LAB — LIPID PANEL
CHOL/HDL RATIO: 2.5 ratio
CHOLESTEROL: 107 mg/dL (ref 0–200)
HDL: 43 mg/dL (ref >40)
LDL Cholesterol: 56 mg/dL (ref 0–99)
Triglycerides: 38 mg/dL (ref <150)
VLDL: 8 mg/dL (ref 0–40)

## 2017-03-07 LAB — TROPONIN I
TROPONIN I: 1.31 ng/mL — AB (ref <0.03)
TROPONIN I: 1.59 ng/mL — AB (ref <0.03)
Troponin I: 0.07 ng/mL (ref <0.03)
Troponin I: 2.06 ng/mL (ref <0.03)

## 2017-03-07 LAB — COMPREHENSIVE METABOLIC PANEL
ALBUMIN: 3.5 g/dL (ref 3.5–5.0)
ALT: 63 U/L (ref 17–63)
ANION GAP: 12 (ref 5–15)
AST: 58 U/L — ABNORMAL HIGH (ref 15–41)
Alkaline Phosphatase: 93 U/L (ref 38–126)
BILIRUBIN TOTAL: 1.4 mg/dL — AB (ref 0.3–1.2)
BUN: 27 mg/dL — ABNORMAL HIGH (ref 6–20)
CO2: 20 mmol/L — ABNORMAL LOW (ref 22–32)
Calcium: 9 mg/dL (ref 8.9–10.3)
Chloride: 106 mmol/L (ref 101–111)
Creatinine, Ser: 1.74 mg/dL — ABNORMAL HIGH (ref 0.61–1.24)
GFR, EST AFRICAN AMERICAN: 37 mL/min — AB (ref >60)
GFR, EST NON AFRICAN AMERICAN: 32 mL/min — AB (ref >60)
GLUCOSE: 144 mg/dL — AB (ref 65–99)
POTASSIUM: 5.1 mmol/L (ref 3.5–5.1)
Sodium: 138 mmol/L (ref 135–145)
TOTAL PROTEIN: 6.2 g/dL — AB (ref 6.5–8.1)

## 2017-03-07 LAB — PROTIME-INR
INR: 1.1
Prothrombin Time: 14.2 seconds (ref 11.4–15.2)

## 2017-03-07 LAB — I-STAT TROPONIN, ED: Troponin i, poc: 0.05 ng/mL (ref 0.00–0.08)

## 2017-03-07 LAB — HEPARIN LEVEL (UNFRACTIONATED)
HEPARIN UNFRACTIONATED: 0.83 [IU]/mL — AB (ref 0.30–0.70)
HEPARIN UNFRACTIONATED: 0.5 [IU]/mL (ref 0.30–0.70)

## 2017-03-07 LAB — POTASSIUM: Potassium: 4.9 mmol/L (ref 3.5–5.1)

## 2017-03-07 LAB — APTT: aPTT: 34 seconds (ref 24–36)

## 2017-03-07 MED ORDER — HEPARIN SODIUM (PORCINE) 5000 UNIT/ML IJ SOLN
INTRAMUSCULAR | Status: AC
  Filled 2017-03-07: qty 1

## 2017-03-07 MED ORDER — ALPRAZOLAM 0.25 MG PO TABS: 0.2500 mg | ORAL_TABLET | Freq: Two times a day (BID) | ORAL | Status: DC | PRN

## 2017-03-07 MED ORDER — ASPIRIN EC 81 MG PO TBEC
81.0000 mg | DELAYED_RELEASE_TABLET | Freq: Every day | ORAL | Status: DC
  Administered 2017-03-08 – 2017-03-10 (×3): 81 mg via ORAL
  Filled 2017-03-07 (×3): qty 1

## 2017-03-07 MED ORDER — VITAMIN D 1000 UNITS PO TABS
1000.0000 [IU] | ORAL_TABLET | Freq: Every day | ORAL | Status: DC
  Administered 2017-03-07 – 2017-03-10 (×4): 1000 [IU] via ORAL
  Filled 2017-03-07 (×4): qty 1

## 2017-03-07 MED ORDER — CLOPIDOGREL BISULFATE 75 MG PO TABS
75.0000 mg | ORAL_TABLET | Freq: Every day | ORAL | Status: DC
  Administered 2017-03-07 – 2017-03-10 (×4): 75 mg via ORAL
  Filled 2017-03-07 (×4): qty 1

## 2017-03-07 MED ORDER — PANTOPRAZOLE SODIUM 40 MG PO TBEC
40.0000 mg | DELAYED_RELEASE_TABLET | Freq: Every day | ORAL | Status: DC
  Administered 2017-03-07 – 2017-03-10 (×4): 40 mg via ORAL
  Filled 2017-03-07 (×4): qty 1

## 2017-03-07 MED ORDER — HEPARIN SODIUM (PORCINE) 5000 UNIT/ML IJ SOLN
5000.0000 [IU] | Freq: Once | INTRAMUSCULAR | Status: AC
Start: 2017-03-07 — End: 2017-03-07
  Administered 2017-03-07: 5000 [IU] via INTRAVENOUS

## 2017-03-07 MED ORDER — HEPARIN (PORCINE) IN NACL 100-0.45 UNIT/ML-% IJ SOLN
900.0000 [IU]/h | INTRAMUSCULAR | Status: DC
  Administered 2017-03-07: 1000 [IU]/h via INTRAVENOUS
  Administered 2017-03-08 – 2017-03-09 (×2): 900 [IU]/h via INTRAVENOUS
  Filled 2017-03-07 (×4): qty 250

## 2017-03-07 MED ORDER — FLUOXETINE HCL 10 MG PO CAPS
10.0000 mg | ORAL_CAPSULE | Freq: Every day | ORAL | Status: DC
  Administered 2017-03-07 – 2017-03-10 (×4): 10 mg via ORAL
  Filled 2017-03-07 (×5): qty 1

## 2017-03-07 MED ORDER — PRAVASTATIN SODIUM 40 MG PO TABS
40.0000 mg | ORAL_TABLET | Freq: Every day | ORAL | Status: DC
  Administered 2017-03-07 – 2017-03-09 (×3): 40 mg via ORAL
  Filled 2017-03-07 (×3): qty 1

## 2017-03-07 MED ORDER — ALBUTEROL SULFATE (2.5 MG/3ML) 0.083% IN NEBU: 2.5000 mg | INHALATION_SOLUTION | Freq: Four times a day (QID) | RESPIRATORY_TRACT | Status: DC | PRN

## 2017-03-07 MED ORDER — ASPIRIN 81 MG PO CHEW: 324.0000 mg | CHEWABLE_TABLET | Freq: Once | ORAL | Status: AC

## 2017-03-07 MED ORDER — MOMETASONE FURO-FORMOTEROL FUM 100-5 MCG/ACT IN AERO
2.0000 | INHALATION_SPRAY | Freq: Two times a day (BID) | RESPIRATORY_TRACT | Status: DC
  Administered 2017-03-07 – 2017-03-10 (×7): 2 via RESPIRATORY_TRACT
  Filled 2017-03-07 (×2): qty 8.8

## 2017-03-07 MED ORDER — ACETAMINOPHEN 325 MG PO TABS: 650.0000 mg | ORAL_TABLET | ORAL | Status: DC | PRN

## 2017-03-07 MED ORDER — SODIUM CHLORIDE 0.9 % IV SOLN
INTRAVENOUS | Status: DC
  Administered 2017-03-07: 02:00:00 via INTRAVENOUS

## 2017-03-07 MED ORDER — NITROGLYCERIN 0.4 MG SL SUBL: 0.4000 mg | SUBLINGUAL_TABLET | SUBLINGUAL | Status: DC | PRN

## 2017-03-07 MED ORDER — ISOSORBIDE MONONITRATE ER 60 MG PO TB24
120.0000 mg | ORAL_TABLET | Freq: Every day | ORAL | Status: DC
  Administered 2017-03-07 – 2017-03-09 (×3): 120 mg via ORAL
  Filled 2017-03-07 (×3): qty 2

## 2017-03-07 MED ORDER — FAMOTIDINE 20 MG PO TABS
20.0000 mg | ORAL_TABLET | Freq: Every day | ORAL | Status: DC
  Administered 2017-03-07 – 2017-03-09 (×3): 20 mg via ORAL
  Filled 2017-03-07 (×3): qty 1

## 2017-03-07 MED ORDER — TORSEMIDE 20 MG PO TABS
20.0000 mg | ORAL_TABLET | Freq: Every day | ORAL | Status: DC
  Administered 2017-03-07: 20 mg via ORAL
  Filled 2017-03-07: qty 1

## 2017-03-07 MED ORDER — ONDANSETRON HCL 4 MG/2ML IJ SOLN: 4.0000 mg | Freq: Four times a day (QID) | INTRAMUSCULAR | Status: DC | PRN

## 2017-03-07 MED ORDER — RANOLAZINE ER 500 MG PO TB12
500.0000 mg | ORAL_TABLET | Freq: Two times a day (BID) | ORAL | Status: DC
  Administered 2017-03-07 – 2017-03-10 (×7): 500 mg via ORAL
  Filled 2017-03-07 (×8): qty 1

## 2017-03-07 MED ORDER — METOPROLOL SUCCINATE ER 50 MG PO TB24
50.0000 mg | ORAL_TABLET | Freq: Every day | ORAL | Status: DC
  Administered 2017-03-07 – 2017-03-10 (×4): 50 mg via ORAL
  Filled 2017-03-07 (×5): qty 1

## 2017-03-07 MED ORDER — PROSIGHT PO TABS
1.0000 | ORAL_TABLET | Freq: Every day | ORAL | Status: DC
  Administered 2017-03-07 – 2017-03-10 (×4): 1 via ORAL
  Filled 2017-03-07 (×5): qty 1

## 2017-03-07 NOTE — ED Notes (Addendum)
Found pt setting on at foot of bed. Pt confused on why he is here, explained to pt and  He seems to understand why now. Pt repositioned in monitoring cable and IV line situated.

## 2017-03-07 NOTE — ED Notes (Signed)
Date and time results received: 03/07/17 6:30 AM (use smartphrase ".now" to insert current time)  Test: Troponin Critical Value: 1.31  Name of Provider Notified: Dr. Myna Hidalgo via text page and [facility] primary RN  Orders Received? Or Actions Taken?: awaiting response

## 2017-03-07 NOTE — ED Triage Notes (Signed)
Per [facility], pt coming from [facility] when pt woke up with sharp chest pain reminding him of his past heart attack. Pt is complaining of right sided chesty pain that radiates to his left shoulder. Pt was SOB and diaphoretic on arrival. Pt received 324 ASA and 2 nitro via ems.

## 2017-03-07 NOTE — ED Notes (Signed)
MD at bedsdie. Made aware that pt c/o chest pain with movement.

## 2017-03-07 NOTE — Consult Note (Addendum)
CARDIOLOGY CONSULT NOTE   Referring Physician:  Dr. Randal Buba Primary Cardiologist: Dr. Harrell Gave End Reason for Consultation: Chest pain  HPI: Andrew [name] is a 84 y.o. male w/ CAD s/p CABG in 1991, moderate AS, HLD, HTN, CVA, and CKD presents with CP.   Patient has had multiple NSTEMIs before. Last event occcurred in September. Was admitted to [facility] for same symptoms as today. Troponin elevated. Pain resolved with medical therapy and patient elected not to pursue cath due to complications he's had from prior caths. Since this time he has been feeling well. He had not taken any SLN in several months. He awoke this AM with central, substernal chest pain/pressure. This felt exactly like his multiple prior NSTEMIs. He took SLN and his pain improved. He asked his wife to bring him to the hospital for further evaluation.  By the time the patient reached the ED, his chest pain had resolved. A 12 lead ECG revealed a LBBB and nonspecific ST/T wave changes consistent with repolarization abnormalities. He was started on a heparin drip.   The patient denies any medication non-adherence or dietary indiscretion.   Review of Systems:     Cardiac Review of Systems: {Y] = yes [ ]  = no  Chest Pain [  X  ]  Resting SOB [   ] Exertional SOB  [ X ]  Orthopnea [  ]   Pedal Edema [   ]    Palpitations [  ] Syncope  [  ]   Presyncope [   ]  General Review of Systems: [Y] = yes [  ]=no Constitional: recent weight change [  ]; anorexia [  ]; fatigue [ X ]; nausea [  ]; night sweats [  ]; fever [  ]; or chills [  ];                                                                     Eyes : blurred vision [  ]; diplopia [   ]; vision changes [  ];  Amaurosis fugax[  ]; Resp: cough [ X ];  wheezing[  ];  hemoptysis[  ];  PND [  ];  GI:  gallstones[  ], vomiting[  ];  dysphagia[  ]; melena[  ];  hematochezia [  ]; heartburn[  ];   GU: kidney stones [  ]; hematuria[  ];   dysuria [  ];  nocturia[  ];  incontinence [  ];             Skin: rash, swelling[  ];, hair loss[  ];  peripheral edema[  ];  or itching[  ]; Musculosketetal: myalgias[  ];  joint swelling[  ];  joint erythema[  ];  joint pain[  ];  back pain[  ];  Heme/Lymph: bruising[  ];  bleeding[  ];  anemia[  ];  Neuro: TIA[  ];  headaches[  ];  stroke[  ];  vertigo[  ];  seizures[  ];   paresthesias[  ];  difficulty walking[  ];  Psych:depression[  ]; anxiety[  ];  Endocrine: diabetes[  ];  thyroid dysfunction[  ];  Other:  Past Medical History:  Diagnosis Date  . Allergic rhinitis   .  AORTIC STENOSIS   . BPV (benign positional vertigo)   . CAD, ARTERY BYPASS GRAFT   . CAROTID ARTERY STENOSIS 03/07/2010   80%  . CHF (congestive heart failure) ([place])   . COPD, mild ([place]) 12/22/2010  . GASTROESOPHAGEAL REFLUX DISEASE   . History of colonic polyps    1999, 2004  . History of prostate cancer 2002   s/p treatment with seeds / radiation  . HYPERCHOLESTEROLEMIA   . Legally blind   . Loss of hearing    left,  . Lumbar spinal stenosis 07/13/2009  . Macular degeneration (senile) of retina 04/29/2014  . Pancreatic cyst 01/29/2013   Noted on MRI scan from [facility] on 07/27/2011.  I reviewed report at patient request on 01-29-2013.  "Small cystic focus in the posterior pancreatic head.  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  Follow up MRI is advised. The main pancreatic duct remains normal in appearance."  Patient do  . S/P excision of acoustic neuroma   . Skin cancer   . Stroke ([place])   . Thyroid nodule 05/2010   Abnormal biopsy, 84 year old patient and his wife have made informed decision to not pursue surgical resection. Potential risk including cancer has been thoroughly discussed with the patient.     (Not in a hospital admission)   . heparin        Infusions: . sodium chloride    . heparin      No Known  Allergies  Social History   Socioeconomic History  . Marital status: Married    Spouse name: Not on file  . Number of children: Not on file  . Years of education: Not on file  . Highest education level: Not on file  Social Needs  . Financial resource strain: Not on file  . Food insecurity - worry: Not on file  . Food insecurity - inability: Not on file  . Transportation needs - medical: Not on file  . Transportation needs - non-medical: Not on file  Occupational History  . 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 Smoking status: Former Smoker    Packs/day: 2.00    Years: 20.00    Pack years: 40.00    Types: Cigarettes    Last attempt to quit: 01/22/1961    Years since quitting: 56.1  . Smokeless tobacco: Never Used  Substance and Sexual Activity  . Alcohol use: Yes    Alcohol/week: 4.2 oz    Types: 7 Shots of liquor per week    Comment: 1 drink nightly  . Drug use: No  . Sexual activity: No  Other Topics Concern  . Not on file  Social History Narrative  . Not on file    Family History  Problem Relation Age of Onset  . Emphysema Brother   . Lung cancer Brother   . Heart disease Father   . Hypertension Father   . Heart attack Father   . Colon cancer Sister   . Non-Hodgkin's lymphoma Sister   . Heart disease Brother   . Colon cancer Sister   . Skin cancer Sister   . Alcohol abuse Unknown   . Arthritis Unknown   . Cancer Unknown   . Macular degeneration Unknown   . Lung cancer Daughter     PHYSICAL EXAM: Vitals:   03/07/17  0200 03/07/17 0205  BP: 126/80 128/77  Pulse: (!) 54 64  Resp: 18 20  SpO2: 97% 97%    No intake or output data in the 24 hours ending 03/07/17 0224  General:  Elderly, comfortable, NAD HEENT: normal Neck: supple. JVP elevated to ~7 cm H2O Cor: PMI nondisplaced. Regular rate & rhythm. 3/6 crescendo/decrescendo systolic murmur at the LUSB and heard throughout the  precordium Lungs: clear Abdomen: soft, nontender, nondistended. No hepatosplenomegaly. No bruits or masses. Good bowel sounds. Extremities: no cyanosis, clubbing, rash, edema Neuro: alert & oriented x 3, cranial nerves grossly intact. moves all 4 extremities w/o difficulty. Affect pleasant.  ECG: LBBB with repolarization abnormalities  Results for orders placed or performed during the hospital encounter of 03/07/17 (from the past 24 hour(s))  I-Stat Chem 8, ED     Status: Abnormal   Collection Time: 03/07/17  2:16 AM  Result Value Ref Range   Sodium 138 135 - 145 mmol/L   Potassium 5.8 (H) 3.5 - 5.1 mmol/L   Chloride 105 101 - 111 mmol/L   BUN 35 (H) 6 - 20 mg/dL   Creatinine, Ser 1.70 (H) 0.61 - 1.24 mg/dL   Glucose, Bld 143 (H) 65 - 99 mg/dL   Calcium, Ion 1.12 (L) 1.15 - 1.40 mmol/L   TCO2 26 22 - 32 mmol/L   Hemoglobin 15.0 13.0 - 17.0 g/dL   HCT 44.0 39.0 - 52.0 %   No results found.  ASSESSMENT: Andrew [name] is a 84 y.o. male w/ severe multivessel coronary artery disease and multiple other competing comorbidities such as CKD. His symptoms today are highly suggestive of anginal chest pain. His initial troponin was within the normal range, however it would be no surprise if subsequent troponins resulted above normal. I have discussed with the patient the role for invasive coronary angiography. The patient and his wife were very clear that they would like to avoid invasive procedures at this time. They further reaffirmed the patient's DNAR status. We discussed optimizing his medical antianginal regimen during the current hospitalization. If the patient develops severe or refractory chest pain that is not responsive to medical therapies, we will revisit the possibility of coronary angiography for symptom relief.   PLAN/DISCUSSION: - repeat troponin q6h x 2 - no need to keep patient NPO - ASA 324mg  then 81mg  daily - heparin drip for ACS per pharmacy protocol - cont home  pravastatin - cont home metoprolol 50mg  daily, unlikely to tolerate higher dose given resting heart rates in the 50's - cont home imdur 120mg  QD - cont home plavix - SLN, nitro gtt PRN - continue home torsemide 20mg  daily - may consider adding ranolazine if chest pain returns  Marcie Mowers, MD Cardiology Fellow, PGY-5  Attending Addendum:  History and all data above reviewed.  Patient examined.  I agree with the findings as above.  All available labs, radiology testing, previous records reviewed. Agree with documented assessment and plan. Andrew [name] is a 42M with CAD s/p CABG, moderate aortic stenosis, moderate aortic regurgitation, chronic systolic and diastolic heart failure, hypertension, hyperlipidemia, prior stroke, CKD III, and COPD here with recurrent NSTEMI.  EKG shows a new LBBB.  Troponin elevated to 1.59.  He is not interested in cardiac catheterization or other aggressive measures.  Continue heparin drip.  He is on aspirin, clopidogrel, Imdur 120 mg qhs, metoprolol and pravastatin.  We will try adding ranolazine 500mg  q12h.  Plan for 48 hours of heparin.  Repeat echo pending.  Thurley Francesconi C. Oval Linsey, MD, [facility]  03/07/2017 2:14 PM

## 2017-03-07 NOTE — Progress Notes (Signed)
ANTICOAGULATION CONSULT NOTE - Initial Consult  Pharmacy Consult for Heparin Indication: chest pain/ACS  No Known Allergies  Patient Measurements: Height: 5\' 9"  (175.3 cm) Weight: 183 lb (83 kg) IBW/kg (Calculated) : 70.7 Heparin Dosing Weight: 80  Vital Signs: BP: 128/77 (02/14 0205) Pulse Rate: 64 (02/14 0205)  Labs: Recent Labs    03/05/17 1531 03/07/17 0216  HGB  --  15.0  HCT  --  44.0  CREATININE 1.64* 1.70*    Estimated Creatinine Clearance: 27.7 mL/min (A) (by C-G formula based on SCr of 1.7 mg/dL (H)).   Medical History: Past Medical History:  Diagnosis Date  . Allergic rhinitis   . AORTIC STENOSIS   . BPV (benign positional vertigo)   . CAD, ARTERY BYPASS GRAFT   . CAROTID ARTERY STENOSIS 03/07/2010   80%  . CHF (congestive heart failure) ([place])   . COPD, mild ([place]) 12/22/2010  . GASTROESOPHAGEAL REFLUX DISEASE   . History of colonic polyps    1999, 2004  . History of prostate cancer 2002   s/p treatment with seeds / radiation  . HYPERCHOLESTEROLEMIA   . Legally blind   . Loss of hearing    left,  . Lumbar spinal stenosis 07/13/2009  . Macular degeneration (senile) of retina 04/29/2014  . Pancreatic cyst 01/29/2013   Noted on MRI scan from [facility] on 07/27/2011.  I reviewed report at patient request on 01-29-2013.  "Small cystic focus in the posterior pancreatic head.  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  Follow up MRI is advised. The main pancreatic duct remains normal in appearance."  Patient do  . S/P excision of acoustic neuroma   . Skin cancer   . Stroke ([place])   . Thyroid nodule 05/2010   Abnormal biopsy, 84 year old patient and his wife have made informed decision to not pursue surgical resection. Potential risk including cancer has been thoroughly discussed with the patient.    Medications:  Current Facility-Administered Medications  on File Prior to Encounter  Medication Dose Route Frequency Provider Last Rate Last Dose  . [COMPLETED] methylPREDNISolone acetate (DEPO-MEDROL) injection 80 mg  80 mg Intra-articular Once Copland, Spencer, MD   80 mg at 03/06/17 1442   Current Outpatient Medications on File Prior to Encounter  Medication Sig Dispense Refill  . acetaminophen (TYLENOL) 500 MG tablet Take 500 mg by mouth every 6 (six) hours as needed (pain).    Marland Kitchen albuterol (PROVENTIL HFA;VENTOLIN HFA) 108 (90 Base) MCG/ACT inhaler Inhale 2 puffs into the lungs every 6 (six) hours as needed for wheezing or shortness of breath. 1 Inhaler 2  . B Complex-C-Folic Acid (NEPHRO-VITE PO) Take 1 tablet by mouth daily.      . budesonide-formoterol (SYMBICORT) 80-4.5 MCG/ACT inhaler Inhale 2 puffs into the lungs 2 (two) times daily. 1 Inhaler 11  . cephALEXin (KEFLEX) 500 MG capsule Take 1 capsule (500 mg total) by mouth 2 (two) times daily for 7 days. 14 capsule 0  . cholecalciferol (VITAMIN D) 1000 units tablet Take 1,000 Units by mouth daily.    . clopidogrel (PLAVIX) 75 MG tablet TAKE ONE TABLET BY MOUTH ONE TIME DAILY  90 tablet 0  . famotidine (PEPCID) 20 MG tablet One at bedtime 30 tablet 2  . FLUoxetine (PROZAC) 10 MG tablet Take 1 tablet (10 mg total) by mouth daily. 90 tablet 1  . isosorbide mononitrate (IMDUR) 120 MG 24 hr tablet  Take 1 tablet (120 mg total) by mouth at bedtime. 90 tablet 3  . metoprolol succinate (TOPROL-XL) 50 MG 24 hr tablet Take 1 tablet (50 mg total) by mouth daily. Take with or immediately following a meal. 90 tablet 3  . Multiple Vitamins-Minerals (PRESERVISION/LUTEIN) CAPS Take 1 capsule by mouth 2 (two) times daily.    . nitroGLYCERIN (NITROLINGUAL) 0.4 MG/SPRAY spray Place 1 spray under the tongue every 5 (five) minutes x 3 doses as needed for chest pain. 4.9 g 1  . OVER THE COUNTER MEDICATION Place 1 drop into both eyes daily as needed (dry eyes). OTC lubricating eye drop    . pantoprazole (PROTONIX) 40  MG tablet Take 1 tablet (40 mg total) by mouth daily. Take 30-60 min before first meal of the day 30 tablet 2  . pravastatin (PRAVACHOL) 40 MG tablet Take 1 tablet (40 mg total) by mouth at bedtime. 90 tablet 3  . torsemide (DEMADEX) 20 MG tablet Take 1 tablet (20 mg total) by mouth daily. 90 tablet 1    Assessment: 84 y.o. male with chest pain for heparin.  Heparin 5000 units IV bolus given in ED at 0200 Goal of Therapy:  Heparin level 0.3-0.7 units/ml Monitor platelets by anticoagulation protocol: Yes   Plan:  Start heparin 1000 units/hr Check heparin level in 8 hours.   Caryl Pina 03/07/2017,2:26 AM

## 2017-03-07 NOTE — ED Notes (Signed)
Patient denies pain and is resting comfortably.  

## 2017-03-07 NOTE — Progress Notes (Signed)
Patient ID: Andrew [name], male   DOB: [birth year], 84 y.o.   MRN: [id number] Patient was admitted early this morning for chest pain and positive troponins.  Cardiology was consulted.  Patient seen and examined at bedside and plan of care discussed with the patient and his wife.  This morning's H&P and prior medical records were reviewed by myself.  Patient is currently chest pain-free.  The most recent troponin is 1.59.  Cardiology is following.  Follow further cardiology recommendations.  Continue medical management for now.  Repeat a.m. labs

## 2017-03-07 NOTE — ED Notes (Signed)
Pharmacy contacted for medications

## 2017-03-07 NOTE — H&P (Signed)
History and Physical    Andrew [name] WFU:[id number] DOB: [birth year] DOA: 03/07/2017  PCP: [name], MD   Patient coming from: [place] [facility] [place]   Chief Complaint: Chest pain, SOB  HPI: Andrew [name] is a 84 y.o. male with medical history significant for coronary artery disease status post remote CABG, chronic kidney disease stage III, chronic combined systolic/diastolic CHF, and COPD, now presenting to the emergency department after waking with chest pain.  Patient reports that he had gone to bed in his usual state and woke overnight with pain in his chest accompanied by shortness of breath.  Symptoms are described as very similar to his prior MIs.  He took 2 doses of nitroglycerin without relief and then called EMS.  He chewed some baby aspirin while waiting for EMS.  Pain eventually began to subside.  Denies recent fevers or chills, cough, abdominal pain, vomiting, or diarrhea.  ED Course: Upon arrival to the ED, patient is found to be afebrile, saturating well on room air, and with vitals otherwise normal.  EKG features a sinus rhythm with new LBBB.  Chest x-ray is notable for borderline cardiomegaly with aortic atherosclerosis and chronic opacities about the hilum.  CBC features a chronic stable thrombocytopenia with platelets 105,000.  Chemistry panel is notable for a creatinine of 1.74, slightly up from his apparent baseline.  Initial troponin is elevated to 0.07.  Patient was started on IV heparin infusion and cardiology was consulted by the ED physician.  Cardiology recommended a medical admission.  Patient will be admitted to the stepdown unit for ongoing evaluation and management of acute chest pain with elevated troponin and new LBBB.  Review of Systems:  All other systems reviewed and apart from HPI, are negative.  Past Medical History:  Diagnosis Date  . Allergic rhinitis   . AORTIC STENOSIS   . BPV (benign positional vertigo)   . CAD, ARTERY BYPASS GRAFT   .  CAROTID ARTERY STENOSIS 03/07/2010   80%  . CHF (congestive heart failure) ([place])   . COPD, mild ([place]) 12/22/2010  . GASTROESOPHAGEAL REFLUX DISEASE   . History of colonic polyps    1999, 2004  . History of prostate cancer 2002   s/p treatment with seeds / radiation  . HYPERCHOLESTEROLEMIA   . Legally blind   . Loss of hearing    left,  . Lumbar spinal stenosis 07/13/2009  . Macular degeneration (senile) of retina 04/29/2014  . Pancreatic cyst 01/29/2013   Noted on MRI scan from [facility] on 07/27/2011.  I reviewed report at patient request on 01-29-2013.  "Small cystic focus in the posterior pancreatic head.  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  Follow up MRI is advised. The main pancreatic duct remains normal in appearance."  Patient do  . S/P excision of acoustic neuroma   . Skin cancer   . Stroke ([place])   . Thyroid nodule 05/2010   Abnormal biopsy, 84 year old patient and his wife have made informed decision to not pursue surgical resection. Potential risk including cancer has been thoroughly discussed with the patient.    Past Surgical History:  Procedure Laterality Date  . CATARACT EXTRACTION    . CATARACT EXTRACTION  2010  . CHOLECYSTECTOMY    . CORONARY ARTERY BYPASS GRAFT    . CRANIECTOMY FOR EXCISION OF ACOUSTIC NEUROMA  1985  . EYE SURGERY    .  SHOULDER SURGERY    . TONSILLECTOMY AND ADENOIDECTOMY  1932  . TOTAL KNEE ARTHROPLASTY    . transperineal implatation of palladium       reports that he quit smoking about 56 years ago. His smoking use included cigarettes. He has a 40.00 pack-year smoking history. he has never used smokeless tobacco. He reports that he drinks about 4.2 oz of alcohol per week. He reports that he does not use drugs.  No Known Allergies  Family History  Problem Relation Age of Onset  . Emphysema Brother   . Lung cancer Brother   . Heart  disease Father   . Hypertension Father   . Heart attack Father   . Colon cancer Sister   . Non-Hodgkin's lymphoma Sister   . Heart disease Brother   . Colon cancer Sister   . Skin cancer Sister   . Alcohol abuse Unknown   . Arthritis Unknown   . Cancer Unknown   . Macular degeneration Unknown   . Lung cancer Daughter      Prior to Admission medications   Medication Sig Start Date End Date Taking? Authorizing Provider  acetaminophen (TYLENOL) 500 MG tablet Take 500 mg by mouth every 6 (six) hours as needed (pain).   Yes [provider]  albuterol (PROVENTIL HFA;VENTOLIN HFA) 108 (90 Base) MCG/ACT inhaler Inhale 2 puffs into the lungs every 6 (six) hours as needed for wheezing or shortness of breath. 08/06/16  Yes Copland, Frederico Hamman, MD  B Complex-C-Folic Acid (NEPHRO-VITE PO) Take 1 tablet by mouth daily.     Yes [provider]  budesonide-formoterol (SYMBICORT) 80-4.5 MCG/ACT inhaler Inhale 2 puffs into the lungs 2 (two) times daily. 02/13/17  Yes Tanda Rockers, MD  cholecalciferol (VITAMIN D) 1000 units tablet Take 1,000 Units by mouth daily.   Yes [provider]  clopidogrel (PLAVIX) 75 MG tablet TAKE ONE TABLET BY MOUTH ONE TIME DAILY  01/04/17  Yes End, Harrell Gave, MD  famotidine (PEPCID) 20 MG tablet One at bedtime 02/13/17  Yes Tanda Rockers, MD  FLUoxetine (PROZAC) 10 MG tablet Take 1 tablet (10 mg total) by mouth daily. 10/08/16  Yes Copland, Frederico Hamman, MD  isosorbide mononitrate (IMDUR) 120 MG 24 hr tablet Take 1 tablet (120 mg total) by mouth at bedtime. 10/22/16  Yes Weaver, Scott T, PA-C  metoprolol succinate (TOPROL-XL) 50 MG 24 hr tablet Take 1 tablet (50 mg total) by mouth daily. Take with or immediately following a meal. 11/15/16  Yes End, Harrell Gave, MD  Multiple Vitamins-Minerals (PRESERVISION/LUTEIN) CAPS Take 1 capsule by mouth 2 (two) times daily.   Yes [provider]  nitroGLYCERIN (NITROLINGUAL) 0.4 MG/SPRAY spray Place 1 spray  under the tongue every 5 (five) minutes x 3 doses as needed for chest pain. 12/19/16  Yes End, Harrell Gave, MD  OVER THE COUNTER MEDICATION Place 1 drop into both eyes daily as needed (dry eyes). OTC lubricating eye drop   Yes [provider]  pantoprazole (PROTONIX) 40 MG tablet Take 1 tablet (40 mg total) by mouth daily. Take 30-60 min before first meal of the day 02/13/17  Yes Tanda Rockers, MD  pravastatin (PRAVACHOL) 40 MG tablet Take 1 tablet (40 mg total) by mouth at bedtime. 10/22/16  Yes Weaver, Scott T, PA-C  torsemide (DEMADEX) 20 MG tablet Take 1 tablet (20 mg total) by mouth daily. 11/22/16 03/07/17 Yes End, Harrell Gave, MD  cephALEXin (KEFLEX) 500 MG capsule Take 1 capsule (500 mg total) by mouth 2 (two) times  daily for 7 days. Patient not taking: Reported on 03/07/2017 03/06/17 03/13/17  [name], MD    Physical Exam: Vitals:   03/07/17 0300 03/07/17 0315 03/07/17 0330 03/07/17 0345  BP: 123/61 131/60 130/77 (!) 128/52  Pulse: (!) 59 (!) 59 (!) 101 (!) 58  Resp: 18 19 19 19   SpO2: 98% 97% 98% 98%  Weight:      Height:          Constitutional: NAD, calm Eyes: PERTLA, lids and conjunctivae normal ENMT: Mucous membranes are moist. Posterior pharynx clear of any exudate or lesions.   Neck: normal, supple, no masses, no thyromegaly Respiratory: Breath sounds slightly diminished bilaterally with occasional expiratory wheeze. No accessory muscle use.  Cardiovascular: S1 & S2 heard, regular rate and rhythm. No significant JVD. Abdomen: No distension, no tenderness, no masses palpated. Bowel sounds normal.  Musculoskeletal: no clubbing / cyanosis. No joint deformity upper and lower extremities.    Skin: no significant rashes, lesions, ulcers. Warm, dry, well-perfused. Neurologic: CN 2-12 grossly intact. Sensation intact. Strength 5/5 in all 4 limbs.  Psychiatric: Alert and oriented x 3. Pleasant and cooperative.     Labs on Admission: I have personally reviewed  following labs and imaging studies  CBC: Recent Labs  Lab 03/07/17 0201 03/07/17 0216  WBC 9.9  --   NEUTROABS 8.3*  --   HGB 14.6 15.0  HCT 43.5 44.0  MCV 102.1*  --   PLT 105*  --    Basic Metabolic Panel: Recent Labs  Lab 03/05/17 1531 03/07/17 0201 03/07/17 0216  NA 142 138 138  K 5.1 5.1 5.8*  CL 104 106 105  CO2 23 20*  --   GLUCOSE 83 144* 143*  BUN 22 27* 35*  CREATININE 1.64* 1.74* 1.70*  CALCIUM 8.8 9.0  --    GFR: Estimated Creatinine Clearance: 27.7 mL/min (A) (by C-G formula based on SCr of 1.7 mg/dL (H)). Liver Function Tests: Recent Labs  Lab 03/07/17 0201  AST 58*  ALT 63  ALKPHOS 93  BILITOT 1.4*  PROT 6.2*  ALBUMIN 3.5   No results for input(s): LIPASE, AMYLASE in the last 168 hours. No results for input(s): AMMONIA in the last 168 hours. Coagulation Profile: Recent Labs  Lab 03/07/17 0201  INR 1.10   Cardiac Enzymes: Recent Labs  Lab 03/07/17 0201  TROPONINI 0.07*   BNP (last 3 results) Recent Labs    02/13/17 1525  PROBNP 791.0*   HbA1C: No results for input(s): HGBA1C in the last 72 hours. CBG: No results for input(s): GLUCAP in the last 168 hours. Lipid Profile: Recent Labs    03/07/17 0201  CHOL 107  HDL 43  LDLCALC 56  TRIG 38  CHOLHDL 2.5   Thyroid Function Tests: No results for input(s): TSH, T4TOTAL, FREET4, T3FREE, THYROIDAB in the last 72 hours. Anemia Panel: No results for input(s): VITAMINB12, FOLATE, FERRITIN, TIBC, IRON, RETICCTPCT in the last 72 hours. Urine analysis:    Component Value Date/Time   COLORURINE YELLOW 06/29/2015 [place] 06/29/2015 1451   LABSPEC 1.014 06/29/2015 1451   PHURINE 5.5 06/29/2015 1451   GLUCOSEU NEGATIVE 06/29/2015 1451   GLUCOSEU NEGATIVE 07/05/2009 0858   HGBUR NEGATIVE 06/29/2015 1451   BILIRUBINUR NEGATIVE 06/29/2015 1451   KETONESUR NEGATIVE 06/29/2015 1451   PROTEINUR NEGATIVE 06/29/2015 1451   UROBILINOGEN 0.2 07/05/2009 0858   NITRITE  NEGATIVE 06/29/2015 1451   LEUKOCYTESUR NEGATIVE 06/29/2015 1451   Sepsis Labs: @LABRCNTIP (procalcitonin:4,lacticidven:4) )No results  found for this or any previous visit (from the past 240 hour(s)).   Radiological Exams on Admission: Dg Chest Portable 1 View  Result Date: 03/07/2017 CLINICAL DATA:  Chest pain this evening EXAM: PORTABLE CHEST 1 VIEW COMPARISON:  01/23/2017 chest CT, CXR 12/24/2016 FINDINGS: Borderline cardiomegaly. Aortic atherosclerosis without aneurysmal dilatation is noted. The patient is status post median sternotomy. Chronic interstitial prominence is noted of the lungs. Redemonstration of right perihilar opacities some which are vascular in etiology but others likely representing chronic areas of atelectasis, scarring and possible superimposed pneumonia. No significant effusion. No pneumothorax. Degenerative changes are seen along the dorsal spine and right shoulder. IMPRESSION: Borderline cardiomegaly with aortic atherosclerosis. Chronic opacities about the right hilum consistent with areas of atelectasis, scarring and vascular markings. Superimposed areas of pneumonic consolidation or not entirely excluded. Electronically Signed   By: Ashley Royalty M.D.   On: 03/07/2017 02:36    EKG: Independently reviewed. Sinus rhythm, LBBB.   Assessment/Plan  1. Chest pain with elevated troponin and new LBBB; CAD  - Hx of CAD with CABG in 1991 and a recurrent MI in September '18  - Presents after waking with severe chest pain and SOB similar to prior MI's  - Did not resolve with NTG x2 and EMS was called  - Pain has subsided by time of admission  - EKG with LBBB, appears to be new; CXR appears unchanged; initial troponin elevated to 0.07  - Treated with ASA 324 and IV heparin  - Continue cardiac monitoring, continue IV heparin infusion, trend troponin, repeat EKG, continue beta-blocker, nitrates, statin, ASA  2. Chronic combined systolic & diastolic CHF - Appears euvolemic on  admission  - Continue torsemide 20 mg qD and beta-blocker - Follow daily wts    3. CKD stage III  - SCr is 1.74 on admission, slightly up from apparent baseline - Renally-dose medications, avoid nephrotoxins    4. COPD  - Stable, no wheezing or distress on admission  - Continue inhalers   5. OSA  - Continue CPAP     DVT prophylaxis: IV heparin infusion Code Status: DNR Family Communication: Wife updated at bedside Disposition Plan: Admit to SDU Consults called: Cardiology Admission status: Inpatient    Vianne Bulls, MD [facility] Pager [phone]  If 7PM-7AM, please contact night-coverage [email or web address] Password [facility]  03/07/2017, 4:49 AM

## 2017-03-07 NOTE — Progress Notes (Signed)
ANTICOAGULATION CONSULT NOTE - Follow Up Consult  Pharmacy Consult for Heparin Indication: chest pain/ACS  No Known Allergies  Patient Measurements: Height: 5\' 9"  (175.3 cm) Weight: 181 lb 3.5 oz (82.2 kg) IBW/kg (Calculated) : 70.7 Heparin Dosing Weight: 80  Vital Signs: Temp: 98.8 F (37.1 C) (02/14 2038) Temp Source: Oral (02/14 2038) BP: 129/73 (02/14 2038) Pulse Rate: 73 (02/14 1930)  Labs: Recent Labs    03/07/17 0201 03/07/17 0216 03/07/17 0516 03/07/17 0929 03/07/17 1738 03/07/17 2118  HGB 14.6 15.0  --   --   --   --   HCT 43.5 44.0  --   --   --   --   PLT 105*  --   --   --   --   --   APTT 34  --   --   --   --   --   LABPROT 14.2  --   --   --   --   --   INR 1.10  --   --   --   --   --   HEPARINUNFRC  --   --   --  0.83*  --  0.50  CREATININE 1.74* 1.70* 1.70*  --   --   --   TROPONINI 0.07*  --  1.31* 1.59* 2.06*  --     Estimated Creatinine Clearance: 27.7 mL/min (A) (by C-G formula based on SCr of 1.7 mg/dL (H)).   Medical History: Past Medical History:  Diagnosis Date  . Allergic rhinitis   . AORTIC STENOSIS   . BPV (benign positional vertigo)   . CAD, ARTERY BYPASS GRAFT   . CAROTID ARTERY STENOSIS 03/07/2010   80%  . CHF (congestive heart failure) ([place])   . COPD, mild ([place]) 12/22/2010  . GASTROESOPHAGEAL REFLUX DISEASE   . History of colonic polyps    1999, 2004  . History of prostate cancer 2002   s/p treatment with seeds / radiation  . HYPERCHOLESTEROLEMIA   . Legally blind   . Loss of hearing    left,  . Lumbar spinal stenosis 07/13/2009  . Macular degeneration (senile) of retina 04/29/2014  . Pancreatic cyst 01/29/2013   Noted on MRI scan [facility] on 07/27/2011.  I reviewed report at patient request on 01-29-2013.  "Small cystic focus in the posterior pancreatic head.  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  Follow up  MRI is advised. The main pancreatic duct remains normal in appearance."  Patient do  . S/P excision of acoustic neuroma   . Skin cancer   . Stroke ([place])   . Thyroid nodule 05/2010   Abnormal biopsy, 84 year old patient and his wife have made informed decision to not pursue surgical resection. Potential risk including cancer has been thoroughly discussed with the patient.    Medications:  No current facility-administered medications on file prior to encounter.    Current Outpatient Medications on File Prior to Encounter  Medication Sig Dispense Refill  . acetaminophen (TYLENOL) 500 MG tablet Take 500 mg by mouth every 6 (six) hours as needed (pain).    Marland Kitchen albuterol (PROVENTIL HFA;VENTOLIN HFA) 108 (90 Base) MCG/ACT inhaler Inhale 2 puffs into the lungs every 6 (six) hours as needed for wheezing or shortness of breath. 1 Inhaler 2  . B Complex-C-Folic Acid (NEPHRO-VITE PO) Take 1 tablet by mouth daily.      Marland Kitchen  budesonide-formoterol (SYMBICORT) 80-4.5 MCG/ACT inhaler Inhale 2 puffs into the lungs 2 (two) times daily. 1 Inhaler 11  . cholecalciferol (VITAMIN D) 1000 units tablet Take 1,000 Units by mouth daily.    . clopidogrel (PLAVIX) 75 MG tablet TAKE ONE TABLET BY MOUTH ONE TIME DAILY  90 tablet 0  . famotidine (PEPCID) 20 MG tablet One at bedtime 30 tablet 2  . FLUoxetine (PROZAC) 10 MG tablet Take 1 tablet (10 mg total) by mouth daily. 90 tablet 1  . isosorbide mononitrate (IMDUR) 120 MG 24 hr tablet Take 1 tablet (120 mg total) by mouth at bedtime. 90 tablet 3  . metoprolol succinate (TOPROL-XL) 50 MG 24 hr tablet Take 1 tablet (50 mg total) by mouth daily. Take with or immediately following a meal. 90 tablet 3  . Multiple Vitamins-Minerals (PRESERVISION/LUTEIN) CAPS Take 1 capsule by mouth 2 (two) times daily.    . nitroGLYCERIN (NITROLINGUAL) 0.4 MG/SPRAY spray Place 1 spray under the tongue every 5 (five) minutes x 3 doses as needed for chest pain. 4.9 g 1  . OVER THE COUNTER MEDICATION  Place 1 drop into both eyes daily as needed (dry eyes). OTC lubricating eye drop    . pantoprazole (PROTONIX) 40 MG tablet Take 1 tablet (40 mg total) by mouth daily. Take 30-60 min before first meal of the day 30 tablet 2  . pravastatin (PRAVACHOL) 40 MG tablet Take 1 tablet (40 mg total) by mouth at bedtime. 90 tablet 3  . torsemide (DEMADEX) 20 MG tablet Take 1 tablet (20 mg total) by mouth daily. 90 tablet 1  . cephALEXin (KEFLEX) 500 MG capsule Take 1 capsule (500 mg total) by mouth 2 (two) times daily for 7 days. (Patient not taking: Reported on 03/07/2017) 14 capsule 0    Assessment: 84 y.o. male with chest pain for heparin. Pt has a history of multiple STEMI's. Cardiology planning medical management. Heparin level 0.5 at goal on heparin drip 900 uts/hr.   H/H wnl. Plt 105k  Goal of Therapy:  Heparin level 0.3-0.7 units/ml Monitor platelets by anticoagulation protocol: Yes   Plan:  Continue heparin infusion  900 units/hr  Daily CBC, HL and monitor for s/s of bleeding   Bonnita Nasuti Pharm.D. CPP, BCPS Clinical Pharmacist [phone] 03/07/2017 10:30 PM

## 2017-03-07 NOTE — ED Provider Notes (Signed)
[facility] EMERGENCY DEPARTMENT Provider Note   CSN: [id number] Arrival date & time: 03/07/17  0146     History   Chief Complaint Chief Complaint  Patient presents with  . Code STEMI    HPI Andrew [name] is a 84 y.o. male.  The history is provided by the patient and the spouse. The history is limited by the condition of the patient (early dementia ).  Chest Pain   This is a recurrent problem. The current episode started 3 to 5 hours ago. The problem occurs constantly. The problem has been resolved. The pain is associated with rest. The pain is present in the substernal region. The pain is severe. The pain does not radiate. Pertinent negatives include no abdominal pain, no irregular heartbeat, no nausea and no palpitations. Associated symptoms comments: Was clammy per his wife, pain was across the chest and wife gave 2 sprays of NTG which did not ameliorate the pain and then had patient chew an aspirin.  They waited a bit and were advised by nurse at their facility to seek care at hospital.  . He has tried nothing for the symptoms. The treatment provided no relief. Risk factors include being elderly and male gender.  His past medical history is significant for MI.  Pertinent negatives for family medical history include: no Marfan's syndrome.  Procedure history is positive for echocardiogram.    Past Medical History:  Diagnosis Date  . Allergic rhinitis   . AORTIC STENOSIS   . BPV (benign positional vertigo)   . CAD, ARTERY BYPASS GRAFT   . CAROTID ARTERY STENOSIS 03/07/2010   80%  . CHF (congestive heart failure) ([place])   . COPD, mild ([place]) 12/22/2010  . GASTROESOPHAGEAL REFLUX DISEASE   . History of colonic polyps    1999, 2004  . History of prostate cancer 2002   s/p treatment with seeds / radiation  . HYPERCHOLESTEROLEMIA   . Legally blind   . Loss of hearing    left,  . Lumbar spinal stenosis 07/13/2009  . Macular degeneration (senile) of retina  04/29/2014  . Pancreatic cyst 01/29/2013   Noted on MRI scan from [facility] on 07/27/2011.  I reviewed report at patient request on 01-29-2013.  "Small cystic focus in the posterior pancreatic head.  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  Follow up MRI is advised. The main pancreatic duct remains normal in appearance."  Patient do  . S/P excision of acoustic neuroma   . Skin cancer   . Stroke ([place])   . Thyroid nodule 05/2010   Abnormal biopsy, 84 year old patient and his wife have made informed decision to not pursue surgical resection. Potential risk including cancer has been thoroughly discussed with the patient.    Patient Active Problem List   Diagnosis Date Noted  . Coronary artery disease of native heart with stable angina pectoris ([place]) 02/23/2017  . BOOP (bronchiolitis obliterans with organizing pneumonia) ([place]) 02/23/2017  . Hyperkalemia 02/14/2017  . Pulmonary infiltrates on CXR 02/13/2017  . Dyspnea on exertion 02/13/2017  . Cough variant asthma vs UACS 02/13/2017  . Vascular dementia without behavioral disturbance 12/25/2016  . Chronic combined systolic and diastolic heart failure ([place])   . NSTEMI (non-ST elevated myocardial infarction) ([place]) 10/09/2016  . Coronary artery disease of native artery of native heart with stable angina pectoris ([place]) 06/28/2016  . Bilateral carotid artery stenosis 06/28/2016  .  Hyperlipidemia LDL goal <70 06/28/2016  . Advanced Planning: DNR 04/09/2016  . Essential hypertension   . Alcohol abuse   . Thrombocytopenia ([place])   . Right-sided cerebrovascular accident (CVA) ([place])   . Chronic renal insufficiency, stage III (moderate) ([place]) 06/02/2014  . Macular degeneration (senile) of retina 04/29/2014  . Major depressive disorder, single episode 06/11/2013  . TIA (transient ischemic attack) 06/03/2013  . Pancreatic cyst 01/29/2013  . OSA  (obstructive sleep apnea) 12/26/2012  . Tachycardia-bradycardia syndrome ([place]) 08/19/2012  . COPD, minimal-mild 12/22/2010  . S/P excision of acoustic neuroma 06/18/2010  . Pulmonary nodule 06/17/2010  . Vertigo 06/14/2010  . THYROID NODULE 03/13/2010  . PVD- moderate carotid disease 03/07/2010  . Spinal stenosis, lumbar region, with neurogenic claudication 07/13/2009  . ALLERGIC RHINITIS 09/01/2008  . COLONIC POLYPS, HX OF 09/01/2008  . HYPERCHOLESTEROLEMIA 06/18/2008  . Angina, class II ([place]) 06/18/2008  . Hx of CABG 06/18/2008  . Nonrheumatic aortic valve stenosis 06/18/2008  . GASTROESOPHAGEAL REFLUX DISEASE 06/18/2008    Past Surgical History:  Procedure Laterality Date  . CATARACT EXTRACTION    . CATARACT EXTRACTION  2010  . CHOLECYSTECTOMY    . CORONARY ARTERY BYPASS GRAFT    . CRANIECTOMY FOR EXCISION OF ACOUSTIC NEUROMA  1985  . EYE SURGERY    . SHOULDER SURGERY    . TONSILLECTOMY AND ADENOIDECTOMY  1932  . TOTAL KNEE ARTHROPLASTY    . transperineal implatation of palladium         Home Medications    Prior to Admission medications   Medication Sig Start Date End Date Taking? Authorizing Provider  acetaminophen (TYLENOL) 500 MG tablet Take 500 mg by mouth every 6 (six) hours as needed (pain).   Yes [provider]  albuterol (PROVENTIL HFA;VENTOLIN HFA) 108 (90 Base) MCG/ACT inhaler Inhale 2 puffs into the lungs every 6 (six) hours as needed for wheezing or shortness of breath. 08/06/16  Yes Copland, Frederico Hamman, MD  B Complex-C-Folic Acid (NEPHRO-VITE PO) Take 1 tablet by mouth daily.     Yes [provider]  budesonide-formoterol (SYMBICORT) 80-4.5 MCG/ACT inhaler Inhale 2 puffs into the lungs 2 (two) times daily. 02/13/17  Yes Tanda Rockers, MD  cholecalciferol (VITAMIN D) 1000 units tablet Take 1,000 Units by mouth daily.   Yes [provider]  clopidogrel (PLAVIX) 75 MG tablet TAKE ONE TABLET BY MOUTH ONE TIME DAILY  01/04/17  Yes End,  Harrell Gave, MD  famotidine (PEPCID) 20 MG tablet One at bedtime 02/13/17  Yes Tanda Rockers, MD  FLUoxetine (PROZAC) 10 MG tablet Take 1 tablet (10 mg total) by mouth daily. 10/08/16  Yes Copland, Frederico Hamman, MD  isosorbide mononitrate (IMDUR) 120 MG 24 hr tablet Take 1 tablet (120 mg total) by mouth at bedtime. 10/22/16  Yes Weaver, Scott T, PA-C  metoprolol succinate (TOPROL-XL) 50 MG 24 hr tablet Take 1 tablet (50 mg total) by mouth daily. Take with or immediately following a meal. 11/15/16  Yes End, Harrell Gave, MD  Multiple Vitamins-Minerals (PRESERVISION/LUTEIN) CAPS Take 1 capsule by mouth 2 (two) times daily.   Yes [provider]  nitroGLYCERIN (NITROLINGUAL) 0.4 MG/SPRAY spray Place 1 spray under the tongue every 5 (five) minutes x 3 doses as needed for chest pain. 12/19/16  Yes End, Harrell Gave, MD  OVER THE COUNTER MEDICATION Place 1 drop into both eyes daily as needed (dry eyes). OTC lubricating eye drop   Yes [provider]  pantoprazole (PROTONIX) 40 MG tablet Take 1 tablet (40 mg  total) by mouth daily. Take 30-60 min before first meal of the day 02/13/17  Yes Tanda Rockers, MD  pravastatin (PRAVACHOL) 40 MG tablet Take 1 tablet (40 mg total) by mouth at bedtime. 10/22/16  Yes Weaver, Scott T, PA-C  torsemide (DEMADEX) 20 MG tablet Take 1 tablet (20 mg total) by mouth daily. 11/22/16 03/07/17 Yes End, Harrell Gave, MD  cephALEXin (KEFLEX) 500 MG capsule Take 1 capsule (500 mg total) by mouth 2 (two) times daily for 7 days. Patient not taking: Reported on 03/07/2017 03/06/17 03/13/17  [name], MD    Family History Family History  Problem Relation Age of Onset  . Emphysema Brother   . Lung cancer Brother   . Heart disease Father   . Hypertension Father   . Heart attack Father   . Colon cancer Sister   . Non-Hodgkin's lymphoma Sister   . Heart disease Brother   . Colon cancer Sister   . Skin cancer Sister   . Alcohol abuse Unknown   . Arthritis Unknown   .  Cancer Unknown   . Macular degeneration Unknown   . Lung cancer Daughter     Social History Social History   Tobacco Use  . Smoking status: Former Smoker    Packs/day: 2.00    Years: 20.00    Pack years: 40.00    Types: Cigarettes    Last attempt to quit: 01/22/1961    Years since quitting: 56.1  . Smokeless tobacco: Never Used  Substance Use Topics  . Alcohol use: Yes    Alcohol/week: 4.2 oz    Types: 7 Shots of liquor per week    Comment: 1 drink nightly  . Drug use: No     Allergies   Patient has no known allergies.   Review of Systems Review of Systems  Constitutional: Negative for chills.  Cardiovascular: Positive for chest pain. Negative for palpitations and leg swelling.  Gastrointestinal: Negative for abdominal pain and nausea.  Musculoskeletal: Negative for neck pain.  All other systems reviewed and are negative.    Physical Exam Updated Vital Signs BP 125/61   Pulse 61   Resp (!) 21   Ht 5\' 9"  (1.753 m)   Wt 83 kg (183 lb)   SpO2 99%   BMI 27.02 kg/m   Physical Exam  Constitutional: He appears well-developed and well-nourished.  HENT:  Head: Normocephalic and atraumatic.  Nose: Nose normal.  Mouth/Throat: No oropharyngeal exudate.  Eyes: Pupils are equal, round, and reactive to light.  Neck: Normal range of motion. Neck supple.  Cardiovascular: Normal rate, regular rhythm, normal heart sounds and intact distal pulses.  Pulmonary/Chest: Effort normal and breath sounds normal. No stridor. No respiratory distress. He has no wheezes. He has no rales.  Abdominal: Soft. Bowel sounds are normal. He exhibits no mass. There is no tenderness. There is no rebound and no guarding.  Musculoskeletal: Normal range of motion. He exhibits no edema.  Neurological: He is alert. He displays normal reflexes.  Skin: Skin is warm and dry. Capillary refill takes less than 2 seconds. He is not diaphoretic.  Nursing note and vitals reviewed.    ED Treatments /  Results  Labs (all labs ordered are listed, but only abnormal results are displayed) Results for orders placed or performed during the hospital encounter of 03/07/17  CBC with Differential/Platelet  Result Value Ref Range   WBC 9.9 4.0 - 10.5 K/uL   RBC 4.26 4.22 - 5.81 MIL/uL   Hemoglobin 14.6  13.0 - 17.0 g/dL   HCT 43.5 39.0 - 52.0 %   MCV 102.1 (H) 78.0 - 100.0 fL   MCH 34.3 (H) 26.0 - 34.0 pg   MCHC 33.6 30.0 - 36.0 g/dL   RDW 15.4 11.5 - 15.5 %   Platelets 105 (L) 150 - 400 K/uL   Neutrophils Relative % 84 %   Neutro Abs 8.3 (H) 1.7 - 7.7 K/uL   Lymphocytes Relative 11 %   Lymphs Abs 1.1 0.7 - 4.0 K/uL   Monocytes Relative 5 %   Monocytes Absolute 0.4 0.1 - 1.0 K/uL   Eosinophils Relative 0 %   Eosinophils Absolute 0.0 0.0 - 0.7 K/uL   Basophils Relative 0 %   Basophils Absolute 0.0 0.0 - 0.1 K/uL  Protime-INR  Result Value Ref Range   Prothrombin Time 14.2 11.4 - 15.2 seconds   INR 1.10   APTT  Result Value Ref Range   aPTT 34 24 - 36 seconds  Comprehensive metabolic panel  Result Value Ref Range   Sodium 138 135 - 145 mmol/L   Potassium 5.1 3.5 - 5.1 mmol/L   Chloride 106 101 - 111 mmol/L   CO2 20 (L) 22 - 32 mmol/L   Glucose, Bld 144 (H) 65 - 99 mg/dL   BUN 27 (H) 6 - 20 mg/dL   Creatinine, Ser 1.74 (H) 0.61 - 1.24 mg/dL   Calcium 9.0 8.9 - 10.3 mg/dL   Total Protein 6.2 (L) 6.5 - 8.1 g/dL   Albumin 3.5 3.5 - 5.0 g/dL   AST 58 (H) 15 - 41 U/L   ALT 63 17 - 63 U/L   Alkaline Phosphatase 93 38 - 126 U/L   Total Bilirubin 1.4 (H) 0.3 - 1.2 mg/dL   GFR calc non Af Amer 32 (L) >60 mL/min   GFR calc Af Amer 37 (L) >60 mL/min   Anion gap 12 5 - 15  Lipid panel  Result Value Ref Range   Cholesterol 107 0 - 200 mg/dL   Triglycerides 38 <150 mg/dL   HDL 43 >40 mg/dL   Total CHOL/HDL Ratio 2.5 RATIO   VLDL 8 0 - 40 mg/dL   LDL Cholesterol 56 0 - 99 mg/dL  Troponin I  Result Value Ref Range   Troponin I 0.07 (HH) <0.03 ng/mL  I-stat troponin, ED  Result  Value Ref Range   Troponin i, poc 0.05 0.00 - 0.08 ng/mL   Comment 3          I-Stat Chem 8, ED  Result Value Ref Range   Sodium 138 135 - 145 mmol/L   Potassium 5.8 (H) 3.5 - 5.1 mmol/L   Chloride 105 101 - 111 mmol/L   BUN 35 (H) 6 - 20 mg/dL   Creatinine, Ser 1.70 (H) 0.61 - 1.24 mg/dL   Glucose, Bld 143 (H) 65 - 99 mg/dL   Calcium, Ion 1.12 (L) 1.15 - 1.40 mmol/L   TCO2 26 22 - 32 mmol/L   Hemoglobin 15.0 13.0 - 17.0 g/dL   HCT 44.0 39.0 - 52.0 %   Dg Chest Portable 1 View  Result Date: 03/07/2017 CLINICAL DATA:  Chest pain this evening EXAM: PORTABLE CHEST 1 VIEW COMPARISON:  01/23/2017 chest CT, CXR 12/24/2016 FINDINGS: Borderline cardiomegaly. Aortic atherosclerosis without aneurysmal dilatation is noted. The patient is status post median sternotomy. Chronic interstitial prominence is noted of the lungs. Redemonstration of right perihilar opacities some which are vascular in etiology but others likely representing chronic areas  of atelectasis, scarring and possible superimposed pneumonia. No significant effusion. No pneumothorax. Degenerative changes are seen along the dorsal spine and right shoulder. IMPRESSION: Borderline cardiomegaly with aortic atherosclerosis. Chronic opacities about the right hilum consistent with areas of atelectasis, scarring and vascular markings. Superimposed areas of pneumonic consolidation or not entirely excluded. Electronically Signed   By: Ashley Royalty M.D.   On: 03/07/2017 02:36     EKG  EKG Interpretation  Date/Time:  Thursday March 07 2017 01:52:58 EST Ventricular Rate:  63 PR Interval:    QRS Duration: 140 QT Interval:  422 QTC Calculation: 432 R Axis:   -58 Text Interpretation:  Sinus rhythm Left bundle branch block Confirmed by Randal Buba, Mynor Witkop ([id number]) on 03/07/2017 2:18:00 AM       Radiology Dg Chest Portable 1 View  Result Date: 03/07/2017 CLINICAL DATA:  Chest pain this evening EXAM: PORTABLE CHEST 1 VIEW COMPARISON:  01/23/2017  chest CT, CXR 12/24/2016 FINDINGS: Borderline cardiomegaly. Aortic atherosclerosis without aneurysmal dilatation is noted. The patient is status post median sternotomy. Chronic interstitial prominence is noted of the lungs. Redemonstration of right perihilar opacities some which are vascular in etiology but others likely representing chronic areas of atelectasis, scarring and possible superimposed pneumonia. No significant effusion. No pneumothorax. Degenerative changes are seen along the dorsal spine and right shoulder. IMPRESSION: Borderline cardiomegaly with aortic atherosclerosis. Chronic opacities about the right hilum consistent with areas of atelectasis, scarring and vascular markings. Superimposed areas of pneumonic consolidation or not entirely excluded. Electronically Signed   By: Ashley Royalty M.D.   On: 03/07/2017 02:36    Procedures Procedures (including critical care time)  Medications Ordered in ED Medications  heparin 5000 UNIT/ML injection (not administered)  0.9 %  sodium chloride infusion ( Intravenous New Bag/Given 03/07/17 0228)  heparin ADULT infusion 100 units/mL (25000 units/254mL sodium chloride 0.45%) (1,000 Units/hr Intravenous New Bag/Given 03/07/17 0236)  aspirin chewable tablet 324 mg (324 mg Oral Given by EMS 03/07/17 0156)  heparin injection 5,000 Units (5,000 Units Intravenous Given 03/07/17 0156)       Final Clinical Impressions(s) / ED Diagnoses   Final diagnoses:  NSTEMI (non-ST elevated myocardial infarction) [facility])   Case d/w Dr. Myna Hidalgo   Per the cardiology fellow, the patient is refusing cath and should be admitted to medicine.   The patient's wife states Dr. Lia Foyer said they could not use the patient's groin due to showering cholesterol but if he needed a cath they would use the arm.  The wife currently states that if the patient needs catheterization they will consider it.     Nela Bascom, MD 03/07/17 [id number]

## 2017-03-07 NOTE — ED Notes (Signed)
Pt sitting up and eating supper. Tolerates well

## 2017-03-07 NOTE — Progress Notes (Signed)
ANTICOAGULATION CONSULT NOTE - Follow Up Consult  Pharmacy Consult for Heparin Indication: chest pain/ACS  No Known Allergies  Patient Measurements: Height: 5\' 9"  (175.3 cm) Weight: 183 lb (83 kg) IBW/kg (Calculated) : 70.7 Heparin Dosing Weight: 80  Vital Signs: Temp: 98.7 F (37.1 C) (02/14 0154) Temp Source: Oral (02/14 0154) BP: 105/69 (02/14 0915) Pulse Rate: 92 (02/14 0915)  Labs: Recent Labs    03/07/17 0201 03/07/17 0216 03/07/17 0516 03/07/17 0929  HGB 14.6 15.0  --   --   HCT 43.5 44.0  --   --   PLT 105*  --   --   --   APTT 34  --   --   --   LABPROT 14.2  --   --   --   INR 1.10  --   --   --   HEPARINUNFRC  --   --   --  0.83*  CREATININE 1.74* 1.70* 1.70*  --   TROPONINI 0.07*  --  1.31*  --     Estimated Creatinine Clearance: 27.7 mL/min (A) (by C-G formula based on SCr of 1.7 mg/dL (H)).   Medical History: Past Medical History:  Diagnosis Date  . Allergic rhinitis   . AORTIC STENOSIS   . BPV (benign positional vertigo)   . CAD, ARTERY BYPASS GRAFT   . CAROTID ARTERY STENOSIS 03/07/2010   80%  . CHF (congestive heart failure) ([place])   . COPD, mild ([place]) 12/22/2010  . GASTROESOPHAGEAL REFLUX DISEASE   . History of colonic polyps    1999, 2004  . History of prostate cancer 2002   s/p treatment with seeds / radiation  . HYPERCHOLESTEROLEMIA   . Legally blind   . Loss of hearing    left,  . Lumbar spinal stenosis 07/13/2009  . Macular degeneration (senile) of retina 04/29/2014  . Pancreatic cyst 01/29/2013   Noted on MRI scan from [facility] on 07/27/2011.  I reviewed report at patient request on 01-29-2013.  "Small cystic focus in the posterior pancreatic head.  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  Follow up MRI is advised. The main pancreatic duct remains normal in appearance."  Patient do  . S/P excision of acoustic neuroma   . Skin  cancer   . Stroke ([place])   . Thyroid nodule 05/2010   Abnormal biopsy, 84 year old patient and his wife have made informed decision to not pursue surgical resection. Potential risk including cancer has been thoroughly discussed with the patient.    Medications:  Current Facility-Administered Medications on File Prior to Encounter  Medication Dose Route Frequency Provider Last Rate Last Dose  . [COMPLETED] methylPREDNISolone acetate (DEPO-MEDROL) injection 80 mg  80 mg Intra-articular Once Copland, Spencer, MD   80 mg at 03/06/17 1442   Current Outpatient Medications on File Prior to Encounter  Medication Sig Dispense Refill  . acetaminophen (TYLENOL) 500 MG tablet Take 500 mg by mouth every 6 (six) hours as needed (pain).    Marland Kitchen albuterol (PROVENTIL HFA;VENTOLIN HFA) 108 (90 Base) MCG/ACT inhaler Inhale 2 puffs into the lungs every 6 (six) hours as needed for wheezing or shortness of breath. 1 Inhaler 2  . B Complex-C-Folic Acid (NEPHRO-VITE PO) Take 1 tablet by mouth daily.      . budesonide-formoterol (SYMBICORT) 80-4.5 MCG/ACT inhaler Inhale 2 puffs into the lungs 2 (two) times daily. 1 Inhaler 11  . cholecalciferol (  VITAMIN D) 1000 units tablet Take 1,000 Units by mouth daily.    . clopidogrel (PLAVIX) 75 MG tablet TAKE ONE TABLET BY MOUTH ONE TIME DAILY  90 tablet 0  . famotidine (PEPCID) 20 MG tablet One at bedtime 30 tablet 2  . FLUoxetine (PROZAC) 10 MG tablet Take 1 tablet (10 mg total) by mouth daily. 90 tablet 1  . isosorbide mononitrate (IMDUR) 120 MG 24 hr tablet Take 1 tablet (120 mg total) by mouth at bedtime. 90 tablet 3  . metoprolol succinate (TOPROL-XL) 50 MG 24 hr tablet Take 1 tablet (50 mg total) by mouth daily. Take with or immediately following a meal. 90 tablet 3  . Multiple Vitamins-Minerals (PRESERVISION/LUTEIN) CAPS Take 1 capsule by mouth 2 (two) times daily.    . nitroGLYCERIN (NITROLINGUAL) 0.4 MG/SPRAY spray Place 1 spray under the tongue every 5 (five) minutes x 3  doses as needed for chest pain. 4.9 g 1  . OVER THE COUNTER MEDICATION Place 1 drop into both eyes daily as needed (dry eyes). OTC lubricating eye drop    . pantoprazole (PROTONIX) 40 MG tablet Take 1 tablet (40 mg total) by mouth daily. Take 30-60 min before first meal of the day 30 tablet 2  . pravastatin (PRAVACHOL) 40 MG tablet Take 1 tablet (40 mg total) by mouth at bedtime. 90 tablet 3  . torsemide (DEMADEX) 20 MG tablet Take 1 tablet (20 mg total) by mouth daily. 90 tablet 1  . cephALEXin (KEFLEX) 500 MG capsule Take 1 capsule (500 mg total) by mouth 2 (two) times daily for 7 days. (Patient not taking: Reported on 03/07/2017) 14 capsule 0    Assessment: 84 y.o. male with chest pain for heparin. Pt has a history of multiple STEMI's. Cardiology planning medical management. HL this AM is supra-therapeutic at 0.83. H/H wnl. Plt 105k  Goal of Therapy:  Heparin level 0.3-0.7 units/ml Monitor platelets by anticoagulation protocol: Yes   Plan:  Decrease heparin infusion to 900 units/hr Check heparin level in 8 hours.  Daily CBC, HL and monitor for s/s of bleeding   Albertina Parr, PharmD., BCPS Clinical Pharmacist Pager [phone]

## 2017-03-07 NOTE — Progress Notes (Signed)
Pt arrived from ED to 4E14. VSS. Pt denies any SOB or chest pain. Pt and pt's family at bedside, updated on plan of care. Oriented to room and call light, will continue to monitor.  Jaymes Graff, RN

## 2017-03-08 ENCOUNTER — Inpatient Hospital Stay (HOSPITAL_COMMUNITY): Payer: Medicare Other

## 2017-03-08 DIAGNOSIS — J449 Chronic obstructive pulmonary disease, unspecified: Secondary | ICD-10-CM

## 2017-03-08 DIAGNOSIS — J8489 Other specified interstitial pulmonary diseases: Secondary | ICD-10-CM

## 2017-03-08 DIAGNOSIS — I340 Nonrheumatic mitral (valve) insufficiency: Secondary | ICD-10-CM

## 2017-03-08 DIAGNOSIS — G4733 Obstructive sleep apnea (adult) (pediatric): Secondary | ICD-10-CM

## 2017-03-08 DIAGNOSIS — I110 Hypertensive heart disease with heart failure: Secondary | ICD-10-CM

## 2017-03-08 LAB — BASIC METABOLIC PANEL
Anion gap: 12 (ref 5–15)
BUN: 31 mg/dL — ABNORMAL HIGH (ref 6–20)
CALCIUM: 9.1 mg/dL (ref 8.9–10.3)
CO2: 23 mmol/L (ref 22–32)
CREATININE: 1.76 mg/dL — AB (ref 0.61–1.24)
Chloride: 104 mmol/L (ref 101–111)
GFR, EST AFRICAN AMERICAN: 37 mL/min — AB (ref >60)
GFR, EST NON AFRICAN AMERICAN: 32 mL/min — AB (ref >60)
GLUCOSE: 122 mg/dL — AB (ref 65–99)
Potassium: 4.6 mmol/L (ref 3.5–5.1)
Sodium: 139 mmol/L (ref 135–145)

## 2017-03-08 LAB — CBC
HEMATOCRIT: 42.8 % (ref 39.0–52.0)
Hemoglobin: 14.2 g/dL (ref 13.0–17.0)
MCH: 34 pg (ref 26.0–34.0)
MCHC: 33.2 g/dL (ref 30.0–36.0)
MCV: 102.4 fL — ABNORMAL HIGH (ref 78.0–100.0)
PLATELETS: 97 10*3/uL — AB (ref 150–400)
RBC: 4.18 MIL/uL — ABNORMAL LOW (ref 4.22–5.81)
RDW: 15.7 % — AB (ref 11.5–15.5)
WBC: 14.6 10*3/uL — ABNORMAL HIGH (ref 4.0–10.5)

## 2017-03-08 LAB — URINALYSIS, ROUTINE W REFLEX MICROSCOPIC
Bilirubin Urine: NEGATIVE
Glucose, UA: NEGATIVE mg/dL
Hgb urine dipstick: NEGATIVE
Ketones, ur: NEGATIVE mg/dL
LEUKOCYTES UA: NEGATIVE
NITRITE: NEGATIVE
PH: 6 (ref 5.0–8.0)
Protein, ur: NEGATIVE mg/dL
SPECIFIC GRAVITY, URINE: 1.005 (ref 1.005–1.030)

## 2017-03-08 LAB — ECHOCARDIOGRAM COMPLETE
Height: 69 in
WEIGHTICAEL: 2899.49 [oz_av]

## 2017-03-08 LAB — HEPARIN LEVEL (UNFRACTIONATED): HEPARIN UNFRACTIONATED: 0.66 [IU]/mL (ref 0.30–0.70)

## 2017-03-08 LAB — MAGNESIUM: Magnesium: 2.1 mg/dL (ref 1.7–2.4)

## 2017-03-08 MED ORDER — TORSEMIDE 20 MG PO TABS
40.0000 mg | ORAL_TABLET | Freq: Every day | ORAL | Status: DC
  Administered 2017-03-08: 40 mg via ORAL
  Filled 2017-03-08: qty 2

## 2017-03-08 NOTE — Progress Notes (Signed)
Progress Note  Patient Name: Andrew [name] Date of Encounter: 03/08/2017  Primary Cardiologist: Nelva Bush, MD   Subjective   Short of breath.  Feels like he can't take a deep breath.  Denies chest pain.   Inpatient Medications    Scheduled Meds: . aspirin EC  81 mg Oral Daily  . cholecalciferol  1,000 Units Oral Daily  . clopidogrel  75 mg Oral Daily  . famotidine  20 mg Oral QHS  . FLUoxetine  10 mg Oral Daily  . isosorbide mononitrate  120 mg Oral QHS  . metoprolol succinate  50 mg Oral Daily  . mometasone-formoterol  2 puff Inhalation BID  . multivitamin  1 tablet Oral Daily  . pantoprazole  40 mg Oral Daily  . pravastatin  40 mg Oral q1800  . ranolazine  500 mg Oral BID  . torsemide  20 mg Oral Daily   Continuous Infusions: . sodium chloride 20 mL/hr (03/07/17 1835)  . heparin 900 Units/hr (03/08/17 0617)   PRN Meds: acetaminophen, albuterol, ALPRAZolam, nitroGLYCERIN, ondansetron (ZOFRAN) IV   Vital Signs    Vitals:   03/07/17 1930 03/07/17 2038 03/08/17 0307 03/08/17 0339  BP: 114/60 129/73  105/64  Pulse: 73     Resp: (!) 24     Temp:  98.8 F (37.1 C)  98.3 F (36.8 C)  TempSrc:  Oral  Oral  SpO2: 93% 94%  90%  Weight:  181 lb 3.5 oz (82.2 kg) 181 lb 3.5 oz (82.2 kg)   Height:  5\' 9"  (1.753 m)      Intake/Output Summary (Last 24 hours) at 03/08/2017 0926 Last data filed at 03/08/2017 0900 Gross per 24 hour  Intake 839.02 ml  Output 400 ml  Net 439.02 ml   Filed Weights   03/07/17 0205 03/07/17 2038 03/08/17 0307  Weight: 183 lb (83 kg) 181 lb 3.5 oz (82.2 kg) 181 lb 3.5 oz (82.2 kg)    Telemetry    Sinus rhythm.  PVCs, SVT, NSVT up to 6 beats.  - Personally Reviewed  ECG    Sinus bradycardia.  Rate 57 bpm.  LBBB. - Personally Reviewed  Physical Exam   VS:  BP 105/64   Pulse 73   Temp 98.3 F (36.8 C) (Oral)   Resp (!) 24   Ht 5\' 9"  (1.753 m)   Wt 181 lb 3.5 oz (82.2 kg)   SpO2 90%   BMI 26.76 kg/m  , BMI Body mass index  is 26.76 kg/m. GENERAL:  Well appearing HEENT: Pupils equal round and reactive, fundi not visualized, oral mucosa unremarkable NECK:  + jugular venous distention, waveform within normal limits, carotid upstroke brisk and symmetric, no bruits LUNGS: Faint crackles at the left base. HEART:  RRR.  PMI not displaced or sustained,S1 and S2 within normal limits, no S3, no S4, no clicks, no rubs, II/VI systolic murmur at the LUSB ABD:  Flat, positive bowel sounds normal in frequency in pitch, no bruits, no rebound, no guarding, no midline pulsatile mass, no hepatomegaly, no splenomegaly EXT:  2 plus pulses throughout, no edema, no cyanosis no clubbing SKIN:  No rashes no nodules NEURO:  Cranial nerves II through XII grossly intact, motor grossly intact throughout.  Doesn't remember meeting me yesterday PSYCH:  Cognitively intact, oriented to person place and time   Labs    Chemistry Recent Labs  Lab 03/07/17 0201 03/07/17 0216 03/07/17 0516 03/08/17 0416  NA 138 138 138 139  K 5.1 5.8*  4.9  4.9 4.6  CL 106 105 106 104  CO2 20*  --  21* 23  GLUCOSE 144* 143* 128* 122*  BUN 27* 35* 23* 31*  CREATININE 1.74* 1.70* 1.70* 1.76*  CALCIUM 9.0  --  9.0 9.1  PROT 6.2*  --   --   --   ALBUMIN 3.5  --   --   --   AST 58*  --   --   --   ALT 63  --   --   --   ALKPHOS 93  --   --   --   BILITOT 1.4*  --   --   --   GFRNONAA 32*  --  33* 32*  GFRAA 37*  --  38* 37*  ANIONGAP 12  --  11 12     Hematology Recent Labs  Lab 03/07/17 0201 03/07/17 0216 03/08/17 0416  WBC 9.9  --  14.6*  RBC 4.26  --  4.18*  HGB 14.6 15.0 14.2  HCT 43.5 44.0 42.8  MCV 102.1*  --  102.4*  MCH 34.3*  --  34.0  MCHC 33.6  --  33.2  RDW 15.4  --  15.7*  PLT 105*  --  97*    Cardiac Enzymes Recent Labs  Lab 03/07/17 0201 03/07/17 0516 03/07/17 0929 03/07/17 1738  TROPONINI 0.07* 1.31* 1.59* 2.06*    Recent Labs  Lab 03/07/17 0214  TROPIPOC 0.05     BNPNo results for input(s): BNP, PROBNP  in the last 168 hours.   DDimer No results for input(s): DDIMER in the last 168 hours.   Radiology    Dg Chest Portable 1 View  Result Date: 03/07/2017 CLINICAL DATA:  Chest pain this evening EXAM: PORTABLE CHEST 1 VIEW COMPARISON:  01/23/2017 chest CT, CXR 12/24/2016 FINDINGS: Borderline cardiomegaly. Aortic atherosclerosis without aneurysmal dilatation is noted. The patient is status post median sternotomy. Chronic interstitial prominence is noted of the lungs. Redemonstration of right perihilar opacities some which are vascular in etiology but others likely representing chronic areas of atelectasis, scarring and possible superimposed pneumonia. No significant effusion. No pneumothorax. Degenerative changes are seen along the dorsal spine and right shoulder. IMPRESSION: Borderline cardiomegaly with aortic atherosclerosis. Chronic opacities about the right hilum consistent with areas of atelectasis, scarring and vascular markings. Superimposed areas of pneumonic consolidation or not entirely excluded. Electronically Signed   By: Ashley Royalty M.D.   On: 03/07/2017 02:36    Cardiac Studies   Echo 03/08/17: - Left ventricle: Posterior lateral and inferior basal hypokinesis   The cavity size was mildly dilated. Wall thickness was increased   in a pattern of moderate LVH. Systolic function was mildly   reduced. The estimated ejection fraction was in the range of 45%   to 50%. Doppler parameters are consistent with both elevated   ventricular end-diastolic filling pressure and elevated left   atrial filling pressure. - Aortic valve: There was mild to moderate stenosis. There was mild   regurgitation. Valve area (VTI): 1.25 cm^2. Valve area (Vmax):   1.27 cm^2. Valve area (Vmean): 1.24 cm^2. - Mitral valve: There was mild regurgitation. - Left atrium: The atrium was mildly dilated. - Atrial septum: No defect or patent foramen ovale was identified. - Pulmonary arteries: PA peak pressure: 60 mm Hg  (S).  Patient Profile     Mr. Compston is a 42M with CAD s/p CABG, moderate aortic stenosis, moderate aortic regurgitation, chronic systolic and diastolic heart failure, hypertension, hyperlipidemia,  prior stroke, CKD III, and COPD here with recurrent NSTEMI.   Assessment & Plan    # NSTEMI: # HYperlipidemia: Mr. Holloran isn't a candidate for cath and isn't interested in having one.  Medical management.  He knows he was added to his regimen yesterday.  Continue heparin for 48 hours.  Continue aspirin, clopidogrel, isosorbide mononitrate, metoprolol, and pravastatin.  Echo did not show any significant wall motion abnormalities.  Systolic function was improved from prior.  # Chronic systolic and diastolic heart failure: # Hypertensive heart disease: LVEF 45-50% increased from 40-45% last year.  Continue medications as above.  We will increase torsemide to 40 mg.  He is volume overloaded on exam and reports shortness of breath.  # Leukocytosis: White blood cell count is elevated today.  His daughter notes that he had a cortisone injection 24 hours ago.  His wife notes that he has a infection on his back.  Chest x-ray was concerning for possible consolidation.  However he is also being worked up for interstitial lung disease with pulmonology.  We will check a urinalysis and defer management to the primary team.  # Aortic stenosis: Mild-moderate on echo this admission.  # Ascending aorta aneurysm: 4.0cm this admission.  Mild.   For questions or updates, please contact [place] Please consult [email or web address] for contact info under Cardiology/STEMI.      Signed, Skeet Latch, MD  03/08/2017, 9:26 AM

## 2017-03-08 NOTE — Progress Notes (Signed)
ANTICOAGULATION CONSULT NOTE - Follow Up Consult  Pharmacy Consult for Heparin Indication: chest pain/ACS  No Known Allergies  Patient Measurements: Height: 5\' 9"  (175.3 cm) Weight: 181 lb 3.5 oz (82.2 kg) IBW/kg (Calculated) : 70.7 Heparin Dosing Weight: 80  Vital Signs: Temp: 98.3 F (36.8 C) (02/15 0339) Temp Source: Oral (02/15 0339) BP: 105/64 (02/15 0339)  Labs: Recent Labs    03/07/17 0201 03/07/17 0216 03/07/17 0516 03/07/17 0929 03/07/17 1738 03/07/17 2118 03/08/17 0416  HGB 14.6 15.0  --   --   --   --  14.2  HCT 43.5 44.0  --   --   --   --  42.8  PLT 105*  --   --   --   --   --  97*  APTT 34  --   --   --   --   --   --   LABPROT 14.2  --   --   --   --   --   --   INR 1.10  --   --   --   --   --   --   HEPARINUNFRC  --   --   --  0.83*  --  0.50 0.66  CREATININE 1.74* 1.70* 1.70*  --   --   --  1.76*  TROPONINI 0.07*  --  1.31* 1.59* 2.06*  --   --     Estimated Creatinine Clearance: 26.8 mL/min (A) (by C-G formula based on SCr of 1.76 mg/dL (H)).   Medical History: Past Medical History:  Diagnosis Date  . Allergic rhinitis   . AORTIC STENOSIS   . BPV (benign positional vertigo)   . CAD, ARTERY BYPASS GRAFT   . CAROTID ARTERY STENOSIS 03/07/2010   80%  . CHF (congestive heart failure) ([place])   . COPD, mild ([place]) 12/22/2010  . GASTROESOPHAGEAL REFLUX DISEASE   . History of colonic polyps    1999, 2004  . History of prostate cancer 2002   s/p treatment with seeds / radiation  . HYPERCHOLESTEROLEMIA   . Legally blind   . Loss of hearing    left,  . Lumbar spinal stenosis 07/13/2009  . Macular degeneration (senile) of retina 04/29/2014  . Pancreatic cyst 01/29/2013   Noted on MRI scan from [facility] on 07/27/2011.  I reviewed report at patient request on 01-29-2013.  "Small cystic focus in the posterior pancreatic head.  Imaging features are not entirely specific, though given the patient demographics favored to represent a  small sidebranch IPMN.  Follow up MRI is advised. The main pancreatic duct remains normal in appearance."  Patient do  . S/P excision of acoustic neuroma   . Skin cancer   . Stroke ([place])   . Thyroid nodule 05/2010   Abnormal biopsy, 84 year old patient and his wife have made informed decision to not pursue surgical resection. Potential risk including cancer has been thoroughly discussed with the patient.    Medications:  No current facility-administered medications on file prior to encounter.    Current Outpatient Medications on File Prior to Encounter  Medication Sig Dispense Refill  . acetaminophen (TYLENOL) 500 MG tablet Take 500 mg by mouth every 6 (six) hours as needed (pain).    Marland Kitchen albuterol (PROVENTIL HFA;VENTOLIN HFA) 108 (90 Base) MCG/ACT inhaler Inhale 2 puffs into the lungs every 6 (six) hours as needed for wheezing or shortness of breath. 1 Inhaler 2  . B Complex-C-Folic  Acid (NEPHRO-VITE PO) Take 1 tablet by mouth daily.      . budesonide-formoterol (SYMBICORT) 80-4.5 MCG/ACT inhaler Inhale 2 puffs into the lungs 2 (two) times daily. 1 Inhaler 11  . cholecalciferol (VITAMIN D) 1000 units tablet Take 1,000 Units by mouth daily.    . clopidogrel (PLAVIX) 75 MG tablet TAKE ONE TABLET BY MOUTH ONE TIME DAILY  90 tablet 0  . famotidine (PEPCID) 20 MG tablet One at bedtime 30 tablet 2  . FLUoxetine (PROZAC) 10 MG tablet Take 1 tablet (10 mg total) by mouth daily. 90 tablet 1  . isosorbide mononitrate (IMDUR) 120 MG 24 hr tablet Take 1 tablet (120 mg total) by mouth at bedtime. 90 tablet 3  . metoprolol succinate (TOPROL-XL) 50 MG 24 hr tablet Take 1 tablet (50 mg total) by mouth daily. Take with or immediately following a meal. 90 tablet 3  . Multiple Vitamins-Minerals (PRESERVISION/LUTEIN) CAPS Take 1 capsule by mouth 2 (two) times daily.    . nitroGLYCERIN (NITROLINGUAL) 0.4 MG/SPRAY spray Place 1 spray under the tongue every 5 (five) minutes x 3 doses as needed for chest pain. 4.9 g 1   . OVER THE COUNTER MEDICATION Place 1 drop into both eyes daily as needed (dry eyes). OTC lubricating eye drop    . pantoprazole (PROTONIX) 40 MG tablet Take 1 tablet (40 mg total) by mouth daily. Take 30-60 min before first meal of the day 30 tablet 2  . pravastatin (PRAVACHOL) 40 MG tablet Take 1 tablet (40 mg total) by mouth at bedtime. 90 tablet 3  . torsemide (DEMADEX) 20 MG tablet Take 1 tablet (20 mg total) by mouth daily. 90 tablet 1  . cephALEXin (KEFLEX) 500 MG capsule Take 1 capsule (500 mg total) by mouth 2 (two) times daily for 7 days. (Patient not taking: Reported on 03/07/2017) 14 capsule 0    Assessment: 84 y.o. male with chest pain continuing on heparin for NSTEMI. Pt has a history of multiple STEMI's. Cardiology planning medical management. Heparin level at goal on heparin drip 900 uts/hr. H/H wnl. Plt low at 97. No bleeding documented.  Goal of Therapy:  Heparin level 0.3-0.7 units/ml Monitor platelets by anticoagulation protocol: Yes   Plan:  Continue heparin infusion  900 units/hr - Cardiology planning 48hrs Daily CBC, heparin level, and monitor for s/s of bleeding   Elicia Lamp, PharmD, BCPS Clinical Pharmacist Clinical phone for 03/08/2017 until 3:30pm: [phone] If after 3:30pm, please call main pharmacy at: [phone] 03/08/2017 8:31 AM

## 2017-03-08 NOTE — Progress Notes (Signed)
Patient ID: Andrew [name], male   DOB: 28-Jul-1923, 84 y.o.   MRN: [id number]  PROGRESS NOTE    Andrew [name]  GEX:[id number] DOB: [birth year] DOA: 03/07/2017 PCP: [name], MD   Brief Narrative:  84 year old male with history of coronary artery disease status post CABG, chronic kidney disease stage III, chronic combined CHF and COPD presented with chest pain and shortness of breath.  Troponin was elevated.  Cardiology was consulted.   Assessment & Plan:   Principal Problem:   ACS (acute coronary syndrome) (HCC) Active Problems:   COPD, minimal-mild   OSA (obstructive sleep apnea)   Chronic renal insufficiency, stage III (moderate) (HCC)   CAD (coronary artery disease)   BOOP (bronchiolitis obliterans with organizing pneumonia) ([place])   New onset left bundle branch block (LBBB)    1.   Non-STEMI in a patient with history of coronary artery disease and CABG  - Cardiology following.  Currently chest pain-free.   -Continue aspirin, Plavix, metoprolol, nitrates, statin, heparin drip and Ranexa  -Follow echocardiogram  2. Chronic combined systolic & diastolic CHF - Cardiology has increased her torsemide to 40 mg p.o. daily.  Continue other meds as above.  Strict input and output.  Daily weights.  3. CKD stage III  - Creatinine stable.  Monitor  4. COPD  - Stable, no wheezing or distress on admission  - Continue inhalers   5. OSA  - Continue CPAP    6.  Leukocytosis -Probably reactive.  Repeat a.m. labs  DVT prophylaxis: Heparin drip Code Status: DNR Family Communication: Spoke to wife at bedside Disposition Plan: Home in 1-2 days  Consultants: Cardiology  Procedures:  Echo on 03/08/2017 Study Conclusions  - Left ventricle: Posterior lateral and inferior basal hypokinesis   The cavity size was mildly dilated. Wall thickness was increased   in a pattern of moderate LVH. Systolic function was mildly   reduced. The estimated ejection fraction was in the range  of 45%   to 50%. Doppler parameters are consistent with both elevated   ventricular end-diastolic filling pressure and elevated left   atrial filling pressure. - Aortic valve: There was mild to moderate stenosis. There was mild   regurgitation. Valve area (VTI): 1.25 cm^2. Valve area (Vmax):   1.27 cm^2. Valve area (Vmean): 1.24 cm^2. - Mitral valve: There was mild regurgitation. - Left atrium: The atrium was mildly dilated. - Atrial septum: No defect or patent foramen ovale was identified. - Pulmonary arteries: PA peak pressure: 60 mm Hg (S).   Antimicrobials: None   Subjective: Patient seen and examined at bedside.  He did not sleep well last night.  Had intermittent cough.  No current chest pain or worsening shortness of breath.  Objective: Vitals:   03/08/17 0307 03/08/17 0339 03/08/17 0923 03/08/17 0940  BP:  105/64 (!) 107/57   Pulse:   (!) 59   Resp:   20   Temp:  98.3 F (36.8 C) (!) 96.5 F (35.8 C)   TempSrc:  Oral Axillary   SpO2:  90% 93% 95%  Weight: 82.2 kg (181 lb 3.5 oz)     Height:        Intake/Output Summary (Last 24 hours) at 03/08/2017 1313 Last data filed at 03/08/2017 0900 Gross per 24 hour  Intake 839.02 ml  Output 400 ml  Net 439.02 ml   Filed Weights   03/07/17 0205 03/07/17 2038 03/08/17 0307  Weight: 83 kg (183 lb) 82.2 kg (181 lb 3.5 oz) 82.2  kg (181 lb 3.5 oz)    Examination:  General exam: Appears calm and comfortable  Respiratory system: Bilateral decreased breath sound at bases with basilar crackles Cardiovascular system: S1 & S2 heard, mild bradycardia  gastrointestinal system: Abdomen is nondistended, soft and nontender. Normal bowel sounds heard. Extremities: No cyanosis, clubbing; trace edema    Data Reviewed: I have personally reviewed following labs and imaging studies  CBC: Recent Labs  Lab 03/07/17 0201 03/07/17 0216 03/08/17 0416  WBC 9.9  --  14.6*  NEUTROABS 8.3*  --   --   HGB 14.6 15.0 14.2  HCT 43.5 44.0  42.8  MCV 102.1*  --  102.4*  PLT 105*  --  97*   Basic Metabolic Panel: Recent Labs  Lab 03/05/17 1531 03/07/17 0201 03/07/17 0216 03/07/17 0516 03/08/17 0416  NA 142 138 138 138 139  K 5.1 5.1 5.8* 4.9  4.9 4.6  CL 104 106 105 106 104  CO2 23 20*  --  21* 23  GLUCOSE 83 144* 143* 128* 122*  BUN 22 27* 35* 23* 31*  CREATININE 1.64* 1.74* 1.70* 1.70* 1.76*  CALCIUM 8.8 9.0  --  9.0 9.1  MG  --   --   --   --  2.1   GFR: Estimated Creatinine Clearance: 26.8 mL/min (A) (by C-G formula based on SCr of 1.76 mg/dL (H)). Liver Function Tests: Recent Labs  Lab 03/07/17 0201  AST 58*  ALT 63  ALKPHOS 93  BILITOT 1.4*  PROT 6.2*  ALBUMIN 3.5   No results for input(s): LIPASE, AMYLASE in the last 168 hours. No results for input(s): AMMONIA in the last 168 hours. Coagulation Profile: Recent Labs  Lab 03/07/17 0201  INR 1.10   Cardiac Enzymes: Recent Labs  Lab 03/07/17 0201 03/07/17 0516 03/07/17 0929 03/07/17 1738  TROPONINI 0.07* 1.31* 1.59* 2.06*   BNP (last 3 results) Recent Labs    02/13/17 1525  PROBNP 791.0*   HbA1C: No results for input(s): HGBA1C in the last 72 hours. CBG: No results for input(s): GLUCAP in the last 168 hours. Lipid Profile: Recent Labs    03/07/17 0201  CHOL 107  HDL 43  LDLCALC 56  TRIG 38  CHOLHDL 2.5   Thyroid Function Tests: No results for input(s): TSH, T4TOTAL, FREET4, T3FREE, THYROIDAB in the last 72 hours. Anemia Panel: No results for input(s): VITAMINB12, FOLATE, FERRITIN, TIBC, IRON, RETICCTPCT in the last 72 hours. Sepsis Labs: No results for input(s): PROCALCITON, LATICACIDVEN in the last 168 hours.  No results found for this or any previous visit (from the past 240 hour(s)).       Radiology Studies: Dg Chest Portable 1 View  Result Date: 03/07/2017 CLINICAL DATA:  Chest pain this evening EXAM: PORTABLE CHEST 1 VIEW COMPARISON:  01/23/2017 chest CT, CXR 12/24/2016 FINDINGS: Borderline cardiomegaly.  Aortic atherosclerosis without aneurysmal dilatation is noted. The patient is status post median sternotomy. Chronic interstitial prominence is noted of the lungs. Redemonstration of right perihilar opacities some which are vascular in etiology but others likely representing chronic areas of atelectasis, scarring and possible superimposed pneumonia. No significant effusion. No pneumothorax. Degenerative changes are seen along the dorsal spine and right shoulder. IMPRESSION: Borderline cardiomegaly with aortic atherosclerosis. Chronic opacities about the right hilum consistent with areas of atelectasis, scarring and vascular markings. Superimposed areas of pneumonic consolidation or not entirely excluded. Electronically Signed   By: Ashley Royalty M.D.   On: 03/07/2017 02:36  Scheduled Meds: . aspirin EC  81 mg Oral Daily  . cholecalciferol  1,000 Units Oral Daily  . clopidogrel  75 mg Oral Daily  . famotidine  20 mg Oral QHS  . FLUoxetine  10 mg Oral Daily  . isosorbide mononitrate  120 mg Oral QHS  . metoprolol succinate  50 mg Oral Daily  . mometasone-formoterol  2 puff Inhalation BID  . multivitamin  1 tablet Oral Daily  . pantoprazole  40 mg Oral Daily  . pravastatin  40 mg Oral q1800  . ranolazine  500 mg Oral BID  . torsemide  40 mg Oral Daily   Continuous Infusions: . sodium chloride 20 mL/hr (03/07/17 1835)  . heparin 900 Units/hr (03/08/17 0617)     LOS: 1 day        Aline August, MD [facility] Pager [phone]  If 7PM-7AM, please contact night-coverage [email or web address] Password [email or web address] 03/08/2017, 1:13 PM

## 2017-03-08 NOTE — Progress Notes (Signed)
  Echocardiogram 2D Echocardiogram has been performed.  Darlina Sicilian M 03/08/2017, 8:45 AM

## 2017-03-09 DIAGNOSIS — N179 Acute kidney failure, unspecified: Secondary | ICD-10-CM

## 2017-03-09 LAB — HEPARIN LEVEL (UNFRACTIONATED): HEPARIN UNFRACTIONATED: 0.54 [IU]/mL (ref 0.30–0.70)

## 2017-03-09 LAB — BASIC METABOLIC PANEL
Anion gap: 12 (ref 5–15)
BUN: 37 mg/dL — AB (ref 6–20)
CALCIUM: 9.1 mg/dL (ref 8.9–10.3)
CHLORIDE: 102 mmol/L (ref 101–111)
CO2: 25 mmol/L (ref 22–32)
CREATININE: 2.15 mg/dL — AB (ref 0.61–1.24)
GFR calc non Af Amer: 25 mL/min — ABNORMAL LOW (ref >60)
GFR, EST AFRICAN AMERICAN: 29 mL/min — AB (ref >60)
Glucose, Bld: 116 mg/dL — ABNORMAL HIGH (ref 65–99)
Potassium: 5 mmol/L (ref 3.5–5.1)
SODIUM: 139 mmol/L (ref 135–145)

## 2017-03-09 LAB — CBC
HCT: 41 % (ref 39.0–52.0)
HEMOGLOBIN: 13.5 g/dL (ref 13.0–17.0)
MCH: 33.8 pg (ref 26.0–34.0)
MCHC: 32.9 g/dL (ref 30.0–36.0)
MCV: 102.5 fL — ABNORMAL HIGH (ref 78.0–100.0)
Platelets: 92 10*3/uL — ABNORMAL LOW (ref 150–400)
RBC: 4 MIL/uL — ABNORMAL LOW (ref 4.22–5.81)
RDW: 16 % — AB (ref 11.5–15.5)
WBC: 11.5 10*3/uL — ABNORMAL HIGH (ref 4.0–10.5)

## 2017-03-09 LAB — MAGNESIUM: MAGNESIUM: 2.1 mg/dL (ref 1.7–2.4)

## 2017-03-09 LAB — TROPONIN I: TROPONIN I: 1.3 ng/mL — AB (ref <0.03)

## 2017-03-09 MED ORDER — POLYETHYLENE GLYCOL 3350 17 G PO PACK: 17.0000 g | PACK | Freq: Every day | ORAL | Status: DC | PRN

## 2017-03-09 MED ORDER — SENNOSIDES-DOCUSATE SODIUM 8.6-50 MG PO TABS
1.0000 | ORAL_TABLET | Freq: Two times a day (BID) | ORAL | Status: DC
  Administered 2017-03-09: 1 via ORAL
  Filled 2017-03-09 (×3): qty 1

## 2017-03-09 MED ORDER — HEPARIN SODIUM (PORCINE) 5000 UNIT/ML IJ SOLN
5000.0000 [IU] | Freq: Three times a day (TID) | INTRAMUSCULAR | Status: DC
Start: 2017-03-09 — End: 2017-03-10
  Administered 2017-03-09 – 2017-03-10 (×2): 5000 [IU] via SUBCUTANEOUS
  Filled 2017-03-09 (×2): qty 1

## 2017-03-09 NOTE — Progress Notes (Addendum)
Spoke with PT she stated her protocol for seeing patient today, and the need for further documention..  This RN spoke with  Dr. Oval Linsey with Cardiology and made her aware. Please see Cardiology Note regarding ambulation. Will continue to monitor patient. Layden Caterino, Bettina Gavia RN

## 2017-03-09 NOTE — Progress Notes (Signed)
Pt refused CPAP for the night.   

## 2017-03-09 NOTE — Progress Notes (Addendum)
PT Cancellation Note  Patient Details Name: HAZEN BRUMETT MRN: [id number] DOB: Sep 10, 1923   Cancelled Treatment:    Reason Eval/Treat Not Completed: Patient not medically ready. Pt's troponin levels trending up from 2/14, no lab work follow-up. Will hold therapy per protocol. PT will continue to f/u with pt as available and appropriate.    [place] 03/09/2017, 8:32 AM

## 2017-03-09 NOTE — Progress Notes (Signed)
Progress Note  Patient Name: Andrew [name] Date of Encounter: 03/09/2017  Primary Cardiologist: Nelva Bush, MD   Subjective   Feeling well.  Breathing has improved.  No chest pain.  Inpatient Medications    Scheduled Meds: . aspirin EC  81 mg Oral Daily  . cholecalciferol  1,000 Units Oral Daily  . clopidogrel  75 mg Oral Daily  . famotidine  20 mg Oral QHS  . FLUoxetine  10 mg Oral Daily  . isosorbide mononitrate  120 mg Oral QHS  . metoprolol succinate  50 mg Oral Daily  . mometasone-formoterol  2 puff Inhalation BID  . multivitamin  1 tablet Oral Daily  . pantoprazole  40 mg Oral Daily  . pravastatin  40 mg Oral q1800  . ranolazine  500 mg Oral BID   Continuous Infusions: . sodium chloride 20 mL/hr (03/07/17 1835)   PRN Meds: acetaminophen, albuterol, ALPRAZolam, nitroGLYCERIN, ondansetron (ZOFRAN) IV   Vital Signs    Vitals:   03/09/17 0334 03/09/17 0747 03/09/17 0804 03/09/17 0823  BP:    119/68  Pulse:  (!) 55 84 84  Resp:  18    Temp: 98.8 F (37.1 C)     TempSrc: Oral     SpO2: 97% 94%    Weight:      Height:        Intake/Output Summary (Last 24 hours) at 03/09/2017 0946 Last data filed at 03/09/2017 0500 Gross per 24 hour  Intake 656 ml  Output 1300 ml  Net -644 ml   Filed Weights   03/07/17 2038 03/08/17 0307 03/09/17 0244  Weight: 181 lb 3.5 oz (82.2 kg) 181 lb 3.5 oz (82.2 kg) 180 lb 5.4 oz (81.8 kg)    Telemetry    Sinus rhythm.  PVCs. - Personally Reviewed  ECG    Sinus bradycardia.  Rate 57 bpm.  LBBB. - Personally Reviewed  Physical Exam   VS:  BP 119/68 (BP Location: Right Arm)   Pulse 84   Temp 98.8 F (37.1 C) (Oral)   Resp 18   Ht 5\' 9"  (1.753 m)   Wt 180 lb 5.4 oz (81.8 kg)   SpO2 94%   BMI 26.63 kg/m  , BMI Body mass index is 26.63 kg/m. GENERAL:  Well appearing HEENT: Pupils equal round and reactive, fundi not visualized, oral mucosa unremarkable NECK:  No jugular venous distention, waveform within normal  limits, carotid upstroke brisk and symmetric, no bruits LUNGS: Faint crackles at the left base. HEART:  RRR.  PMI not displaced or sustained,S1 and S2 within normal limits, no S3, no S4, no clicks, no rubs, II/VI systolic murmur at the LUSB ABD:  Flat, positive bowel sounds normal in frequency in pitch, no bruits, no rebound, no guarding, no midline pulsatile mass, no hepatomegaly, no splenomegaly EXT:  2 plus pulses throughout, no edema, no cyanosis no clubbing SKIN:  No rashes no nodules NEURO:  Cranial nerves II through XII grossly intact, motor grossly intact throughout.  Doesn't remember meeting me yesterday PSYCH:  Cognitively intact, oriented to person place and time   Labs    Chemistry Recent Labs  Lab 03/07/17 0201  03/07/17 0516 03/08/17 0416 03/09/17 0247  NA 138   < > 138 139 139  K 5.1   < > 4.9  4.9 4.6 5.0  CL 106   < > 106 104 102  CO2 20*  --  21* 23 25  GLUCOSE 144*   < > 128*  122* 116*  BUN 27*   < > 23* 31* 37*  CREATININE 1.74*   < > 1.70* 1.76* 2.15*  CALCIUM 9.0  --  9.0 9.1 9.1  PROT 6.2*  --   --   --   --   ALBUMIN 3.5  --   --   --   --   AST 58*  --   --   --   --   ALT 63  --   --   --   --   ALKPHOS 93  --   --   --   --   BILITOT 1.4*  --   --   --   --   GFRNONAA 32*  --  33* 32* 25*  GFRAA 37*  --  38* 37* 29*  ANIONGAP 12  --  11 12 12    < > = values in this interval not displayed.     Hematology Recent Labs  Lab 03/07/17 0201 03/07/17 0216 03/08/17 0416 03/09/17 0247  WBC 9.9  --  14.6* 11.5*  RBC 4.26  --  4.18* 4.00*  HGB 14.6 15.0 14.2 13.5  HCT 43.5 44.0 42.8 41.0  MCV 102.1*  --  102.4* 102.5*  MCH 34.3*  --  34.0 33.8  MCHC 33.6  --  33.2 32.9  RDW 15.4  --  15.7* 16.0*  PLT 105*  --  97* 92*    Cardiac Enzymes Recent Labs  Lab 03/07/17 0201 03/07/17 0516 03/07/17 0929 03/07/17 1738  TROPONINI 0.07* 1.31* 1.59* 2.06*    Recent Labs  Lab 03/07/17 0214  TROPIPOC 0.05     BNPNo results for input(s): BNP,  PROBNP in the last 168 hours.   DDimer No results for input(s): DDIMER in the last 168 hours.   Radiology    No results found.  Cardiac Studies   Echo 03/08/17: - Left ventricle: Posterior lateral and inferior basal hypokinesis   The cavity size was mildly dilated. Wall thickness was increased   in a pattern of moderate LVH. Systolic function was mildly   reduced. The estimated ejection fraction was in the range of 45%   to 50%. Doppler parameters are consistent with both elevated   ventricular end-diastolic filling pressure and elevated left   atrial filling pressure. - Aortic valve: There was mild to moderate stenosis. There was mild   regurgitation. Valve area (VTI): 1.25 cm^2. Valve area (Vmax):   1.27 cm^2. Valve area (Vmean): 1.24 cm^2. - Mitral valve: There was mild regurgitation. - Left atrium: The atrium was mildly dilated. - Atrial septum: No defect or patent foramen ovale was identified. - Pulmonary arteries: PA peak pressure: 60 mm Hg (S).  Patient Profile     [name] is a 16M with CAD s/p CABG, moderate aortic stenosis, moderate aortic regurgitation, chronic systolic and diastolic heart failure, hypertension, hyperlipidemia, prior stroke, CKD III, and COPD here with recurrent NSTEMI.   Assessment & Plan    # NSTEMI: # Hyperlipidemia: [name] isn't a candidate for cath and isn't interested in having one.  Medical management. Ranexa was added to his regimen this admission.  Stop heparin drip given that it has been 48 hours.  We will repeat his troponin given that the last one was still up trending.  Continue aspirin, clopidogrel, isosorbide mononitrate, metoprolol, and pravastatin.  Echo did not show any significant wall motion abnormalities.  Systolic function was improved from prior.  We will asked that physical therapy work with  him today.  We need to make sure he does not have any chest pain with ambulation prior to being able to discharge home.  # Chronic systolic  and diastolic heart failure: # Hypertensive heart disease: LVEF 45-50% increased from 40-45% last year.  Continue medications as above.  His breathing is better today but his renal function is slightly worse.  Hold torsemide today.  # Aortic stenosis: Mild-moderate on echo this admission.  # Ascending aorta aneurysm: 4.0 cm this admission.  Mild.   For questions or updates, please contact [place] Please consult [email or web address] for contact info under Cardiology/STEMI.      Signed, Skeet Latch, MD  03/09/2017, 9:46 AM

## 2017-03-09 NOTE — Evaluation (Signed)
Physical Therapy Evaluation Patient Details Name: Andrew [name] MRN: [id number] DOB: 04-11-1923 Today's Date: 03/09/2017   History of Present Illness  Pt is a 84 y/o male with CAD s/p CABG, moderate aortic stenosis, moderate aortic regurgitation, chronic systolic and diastolic heart failure, hypertension, hyperlipidemia, prior stroke, CKD III, and COPD here with recurrent NSTEMI. Pt is not a candidate for a cath, medical management only.    Clinical Impression  Pt presented supine in bed with HOB elevated, awake and willing to participate in therapy session. Prior to admission, pt reported that he ambulated with use of rollator at all times and was independent with dressing/bathing. Pt lives with his wife at an independent living facility. Pt currently performing bed mobility with min guard, transfers with min A and ambulated a short distance in hallway (~50') with RW and min guard for safety. Pt on RA throughout with SPO2 fluctuating between high 80's and mid 90's with activity. His HR increased from low 60's to 112 bpm at the highest and then quickly returned to mid 60's upon sitting. Pt denied any chest pain, dizziness or discomfort throughout session. Pt would continue to benefit from skilled physical therapy services at this time while admitted and after d/c to address the below listed limitations in order to improve overall safety and independence with functional mobility.     Follow Up Recommendations Home health PT;Supervision/Assistance - 24 hour    Equipment Recommendations  None recommended by PT    Recommendations for Other Services       Precautions / Restrictions Precautions Precautions: Fall Precaution Comments: watch HR, SPO2 Restrictions Weight Bearing Restrictions: No      Mobility  Bed Mobility Overal bed mobility: Needs Assistance Bed Mobility: Supine to Sit     Supine to sit: Min guard     General bed mobility comments: increased time and effort, use of bed  rails, min guard for safety  Transfers Overall transfer level: Needs assistance Equipment used: Rolling walker (2 wheeled) Transfers: Sit to/from Stand Sit to Stand: Min assist         General transfer comment: increased time and effort, first attempt to stand was unsuccessful. Upon second attempt, pt required min A to power into standing and for stability  Ambulation/Gait Ambulation/Gait assistance: Min guard Ambulation Distance (Feet): 50 Feet Assistive device: Rolling walker (2 wheeled) Gait Pattern/deviations: Step-through pattern;Decreased stride length;Decreased step length - right;Decreased step length - left;Shuffle;Drifts right/left Gait velocity: decreased Gait velocity interpretation: <1.8 ft/sec, indicative of risk for recurrent falls General Gait Details: modest instability with use of RW; very slow, cautious gait pattern. Pt with LOB x2 but able to self correct with min guard for safety  Stairs            Wheelchair Mobility    Modified Rankin (Stroke Patients Only)       Balance Overall balance assessment: Needs assistance Sitting-balance support: Feet supported Sitting balance-Leahy Scale: Good     Standing balance support: During functional activity;Bilateral upper extremity supported Standing balance-Leahy Scale: Poor                               Pertinent Vitals/Pain Pain Assessment: No/denies pain    Home Living Family/patient expects to be discharged to:: Private residence Living Arrangements: Spouse/significant other Available Help at Discharge: Family;Available 24 hours/day Type of Home: Independent living facility Home Access: Level entry     Home Layout: One level Home Equipment:  Walker - 4 wheels;Shower seat;Cane - single point      Prior Function Level of Independence: Needs assistance   Gait / Transfers Assistance Needed: pt ambulates with rollator at all times  ADL's / Homemaking Assistance Needed: independent  with ADLs        Hand Dominance        Extremity/Trunk Assessment   Upper Extremity Assessment Upper Extremity Assessment: Overall WFL for tasks assessed    Lower Extremity Assessment Lower Extremity Assessment: Generalized weakness    Cervical / Trunk Assessment Cervical / Trunk Assessment: Kyphotic  Communication   Communication: No difficulties  Cognition Arousal/Alertness: Awake/alert Behavior During Therapy: WFL for tasks assessed/performed Overall Cognitive Status: Within Functional Limits for tasks assessed                                        General Comments      Exercises     Assessment/Plan    PT Assessment Patient needs continued PT services  PT Problem List Decreased activity tolerance;Decreased balance;Decreased mobility;Decreased coordination;Decreased knowledge of use of DME;Decreased safety awareness;Cardiopulmonary status limiting activity       PT Treatment Interventions DME instruction;Gait training;Stair training;Functional mobility training;Therapeutic activities;Therapeutic exercise;Balance training;Neuromuscular re-education;Patient/family education    PT Goals (Current goals can be found in the Care Plan section)  Acute Rehab PT Goals Patient Stated Goal: return home, increase strength PT Goal Formulation: With patient/family Time For Goal Achievement: 03/23/17 Potential to Achieve Goals: Good    Frequency Min 3X/week   Barriers to discharge        Co-evaluation               AM-PAC PT "6 Clicks" Daily Activity  Outcome Measure Difficulty turning over in bed (including adjusting bedclothes, sheets and blankets)?: A Lot Difficulty moving from lying on back to sitting on the side of the bed? : A Lot Difficulty sitting down on and standing up from a chair with arms (e.g., wheelchair, bedside commode, etc,.)?: Unable Help needed moving to and from a bed to chair (including a wheelchair)?: A Little Help  needed walking in hospital room?: A Little Help needed climbing 3-5 steps with a railing? : A Lot 6 Click Score: 13    End of Session Equipment Utilized During Treatment: Gait belt Activity Tolerance: Patient tolerated treatment well Patient left: in chair;with call bell/phone within reach;with family/visitor present Nurse Communication: Mobility status PT Visit Diagnosis: Other abnormalities of gait and mobility (R26.89);Muscle weakness (generalized) (M62.81)    Time: 9983-3825 PT Time Calculation (min) (ACUTE ONLY): 24 min   Charges:   PT Evaluation $PT Eval Moderate Complexity: 1 Mod PT Treatments $Gait Training: 8-22 mins   PT G Codes:        [place], PT, DPT [facility] 03/09/2017, 11:12 AM

## 2017-03-09 NOTE — Progress Notes (Signed)
Patient ID: Andrew [name], male   DOB: [birth year], 84 y.o.   MRN: [id number]  PROGRESS NOTE    Andrew [name]  JYN:[id number] DOB: [birth year] DOA: 03/07/2017 PCP: [name], MD   Brief Narrative:  84 year old male with history of coronary artery disease status post CABG, chronic kidney disease stage III, chronic combined CHF and COPD presented with chest pain and shortness of breath.  Troponin was elevated.  Cardiology was consulted.   Assessment & Plan:   Principal Problem:   ACS (acute coronary syndrome) (HCC) Active Problems:   COPD, minimal-mild   OSA (obstructive sleep apnea)   Chronic renal insufficiency, stage III (moderate) (HCC)   CAD (coronary artery disease)   BOOP (bronchiolitis obliterans with organizing pneumonia) ([place])   New onset left bundle branch block (LBBB)    1.   Non-STEMI in a patient with history of coronary artery disease and CABG  - Cardiology following.  Currently chest pain-free.   -Continue aspirin, Plavix, metoprolol, nitrates, statin and Ranexa. Heparin drip has been discontinued by cardiology -Follow echocardiogram  2. Chronic combined systolic & diastolic CHF - Torsemide held today.  Continue other meds as above.  Strict input and output.  Daily weights.  3. CKD stage III  - Creatinine slightly worse today. Torsemide held. Repeat AM labs.    4. COPD  - Stable, no wheezing or distress on admission  - Continue inhalers   5. OSA  - Continue CPAP    6.  Leukocytosis -Probably reactive.  Improving. Repeat a.m. labs  DVT prophylaxis: switch to heparin subcutaneous Code Status: DNR Family Communication: Spoke to wife at bedside Disposition Plan: Home in 1-2 days  Consultants: Cardiology  Procedures:  Echo on 03/08/2017 Study Conclusions  - Left ventricle: Posterior lateral and inferior basal hypokinesis   The cavity size was mildly dilated. Wall thickness was increased   in a pattern of moderate LVH. Systolic function was  mildly   reduced. The estimated ejection fraction was in the range of 45%   to 50%. Doppler parameters are consistent with both elevated   ventricular end-diastolic filling pressure and elevated left   atrial filling pressure. - Aortic valve: There was mild to moderate stenosis. There was mild   regurgitation. Valve area (VTI): 1.25 cm^2. Valve area (Vmax):   1.27 cm^2. Valve area (Vmean): 1.24 cm^2. - Mitral valve: There was mild regurgitation. - Left atrium: The atrium was mildly dilated. - Atrial septum: No defect or patent foramen ovale was identified. - Pulmonary arteries: PA peak pressure: 60 mm Hg (S).   Antimicrobials: None   Subjective: Patient seen and examined at bedside.  He feels better.  Slightly weak.  No current chest pain or worsening shortness of breath.  Objective: Vitals:   03/09/17 0804 03/09/17 0823 03/09/17 0900 03/09/17 1000  BP:  119/68  (!) 114/46  Pulse: 84 84    Resp:      Temp:      TempSrc:      SpO2:   96%   Weight:      Height:        Intake/Output Summary (Last 24 hours) at 03/09/2017 1249 Last data filed at 03/09/2017 0500 Gross per 24 hour  Intake 656 ml  Output 1300 ml  Net -644 ml   Filed Weights   03/07/17 2038 03/08/17 0307 03/09/17 0244  Weight: 82.2 kg (181 lb 3.5 oz) 82.2 kg (181 lb 3.5 oz) 81.8 kg (180 lb 5.4 oz)    Examination:  General exam: Appears calm and comfortable. No distress Respiratory system: Bilateral decreased breath sound at bases with basilar crackles Cardiovascular system: S1 & S2 heard, rate controlled  gastrointestinal system: Abdomen is nondistended, soft and nontender. Normal bowel sounds heard. Extremities: No cyanosis, clubbing; trace edema    Data Reviewed: I have personally reviewed following labs and imaging studies  CBC: Recent Labs  Lab 03/07/17 0201 03/07/17 0216 03/08/17 0416 03/09/17 0247  WBC 9.9  --  14.6* 11.5*  NEUTROABS 8.3*  --   --   --   HGB 14.6 15.0 14.2 13.5  HCT 43.5  44.0 42.8 41.0  MCV 102.1*  --  102.4* 102.5*  PLT 105*  --  97* 92*   Basic Metabolic Panel: Recent Labs  Lab 03/05/17 1531 03/07/17 0201 03/07/17 0216 03/07/17 0516 03/08/17 0416 03/09/17 0247  NA 142 138 138 138 139 139  K 5.1 5.1 5.8* 4.9  4.9 4.6 5.0  CL 104 106 105 106 104 102  CO2 23 20*  --  21* 23 25  GLUCOSE 83 144* 143* 128* 122* 116*  BUN 22 27* 35* 23* 31* 37*  CREATININE 1.64* 1.74* 1.70* 1.70* 1.76* 2.15*  CALCIUM 8.8 9.0  --  9.0 9.1 9.1  MG  --   --   --   --  2.1 2.1   GFR: Estimated Creatinine Clearance: 21.9 mL/min (A) (by C-G formula based on SCr of 2.15 mg/dL (H)). Liver Function Tests: Recent Labs  Lab 03/07/17 0201  AST 58*  ALT 63  ALKPHOS 93  BILITOT 1.4*  PROT 6.2*  ALBUMIN 3.5   No results for input(s): LIPASE, AMYLASE in the last 168 hours. No results for input(s): AMMONIA in the last 168 hours. Coagulation Profile: Recent Labs  Lab 03/07/17 0201  INR 1.10   Cardiac Enzymes: Recent Labs  Lab 03/07/17 0201 03/07/17 0516 03/07/17 0929 03/07/17 1738 03/09/17 1045  TROPONINI 0.07* 1.31* 1.59* 2.06* 1.30*   BNP (last 3 results) Recent Labs    02/13/17 1525  PROBNP 791.0*   HbA1C: No results for input(s): HGBA1C in the last 72 hours. CBG: No results for input(s): GLUCAP in the last 168 hours. Lipid Profile: Recent Labs    03/07/17 0201  CHOL 107  HDL 43  LDLCALC 56  TRIG 38  CHOLHDL 2.5   Thyroid Function Tests: No results for input(s): TSH, T4TOTAL, FREET4, T3FREE, THYROIDAB in the last 72 hours. Anemia Panel: No results for input(s): VITAMINB12, FOLATE, FERRITIN, TIBC, IRON, RETICCTPCT in the last 72 hours. Sepsis Labs: No results for input(s): PROCALCITON, LATICACIDVEN in the last 168 hours.  No results found for this or any previous visit (from the past 240 hour(s)).       Radiology Studies: No results found.      Scheduled Meds: . aspirin EC  81 mg Oral Daily  . cholecalciferol  1,000 Units  Oral Daily  . clopidogrel  75 mg Oral Daily  . famotidine  20 mg Oral QHS  . FLUoxetine  10 mg Oral Daily  . isosorbide mononitrate  120 mg Oral QHS  . metoprolol succinate  50 mg Oral Daily  . mometasone-formoterol  2 puff Inhalation BID  . multivitamin  1 tablet Oral Daily  . pantoprazole  40 mg Oral Daily  . pravastatin  40 mg Oral q1800  . ranolazine  500 mg Oral BID  . senna-docusate  1 tablet Oral BID   Continuous Infusions: . sodium chloride 20 mL/hr (03/07/17 1835)  LOS: 2 days        Aline August, MD [facility] Pager [phone]  If 7PM-7AM, please contact night-coverage [email or web address] Password [facility] 03/09/2017, 12:49 PM

## 2017-03-10 DIAGNOSIS — I5042 Chronic combined systolic (congestive) and diastolic (congestive) heart failure: Secondary | ICD-10-CM

## 2017-03-10 LAB — CBC
HEMATOCRIT: 42.9 % (ref 39.0–52.0)
HEMOGLOBIN: 14.1 g/dL (ref 13.0–17.0)
MCH: 33.4 pg (ref 26.0–34.0)
MCHC: 32.9 g/dL (ref 30.0–36.0)
MCV: 101.7 fL — ABNORMAL HIGH (ref 78.0–100.0)
Platelets: 94 10*3/uL — ABNORMAL LOW (ref 150–400)
RBC: 4.22 MIL/uL (ref 4.22–5.81)
RDW: 15.7 % — ABNORMAL HIGH (ref 11.5–15.5)
WBC: 9.5 10*3/uL (ref 4.0–10.5)

## 2017-03-10 LAB — BASIC METABOLIC PANEL
Anion gap: 11 (ref 5–15)
BUN: 40 mg/dL — ABNORMAL HIGH (ref 6–20)
CO2: 21 mmol/L — ABNORMAL LOW (ref 22–32)
Calcium: 9.2 mg/dL (ref 8.9–10.3)
Chloride: 106 mmol/L (ref 101–111)
Creatinine, Ser: 1.98 mg/dL — ABNORMAL HIGH (ref 0.61–1.24)
GFR calc Af Amer: 32 mL/min — ABNORMAL LOW (ref >60)
GFR calc non Af Amer: 28 mL/min — ABNORMAL LOW (ref >60)
Glucose, Bld: 114 mg/dL — ABNORMAL HIGH (ref 65–99)
Potassium: 4.2 mmol/L (ref 3.5–5.1)
Sodium: 138 mmol/L (ref 135–145)

## 2017-03-10 LAB — MAGNESIUM: Magnesium: 2.1 mg/dL (ref 1.7–2.4)

## 2017-03-10 MED ORDER — TORSEMIDE 20 MG PO TABS: 20.0000 mg | ORAL_TABLET | Freq: Every day | ORAL | Status: AC

## 2017-03-10 MED ORDER — ASPIRIN 81 MG PO TBEC: 81.0000 mg | DELAYED_RELEASE_TABLET | Freq: Every day | ORAL | 0 refills | Status: DC

## 2017-03-10 MED ORDER — RANOLAZINE ER 500 MG PO TB12: 500.0000 mg | ORAL_TABLET | Freq: Two times a day (BID) | ORAL | 0 refills | Status: DC

## 2017-03-10 NOTE — Progress Notes (Signed)
D/c instructions given to patient and wife. IV's removed, clean and intact. Telemetry removed. Wife to provide transport home to [facility].  Clyde Canterbury, RN

## 2017-03-10 NOTE — Care Management Note (Signed)
Case Management Note Marvetta Gibbons RN, BSN Unit 4E-Case Manager [phone]  Patient Details  Name: Andrew [name] MRN: [id number] Date of Birth: 04/22/1923  Subjective/Objective:     Pt admitted with ACS               Action/Plan: PTA pt lived at home with wife- (life at [place])- per PT eval recommendation for HHPT- order placed- spoke with pt and wife at bedside per wife they would like to use the inhouse therapy at [facility]- which is [facility]- call made to [facility]- to see about getting fax #- nurse that CM spoke with could not find fax # and [facility] is not in house on the weekends- she did provide # to [employer] and msg left for the regarding HHPT needs for pt with CM return call # for them to call on Monday 2/18 so that order can be faxed. CM will f/u on 2/18.   Expected Discharge Date:  03/10/17               Expected Discharge Plan:  [place]  In-House Referral:  NA  Discharge planning Services  CM Consult  Post Acute Care Choice:  Home Health Choice offered to:  Patient, Spouse  DME Arranged:    DME Agency:     HH Arranged:  PT [place]:  Other - See comment  Status of Service:  Completed, signed off  If discussed at [employer] of Stay Meetings, dates discussed:    Discharge Disposition: home/home health   Additional Comments:  Dawayne Patricia, RN 03/10/2017, 12:41 PM

## 2017-03-10 NOTE — Progress Notes (Signed)
I have sent a message to our office's scheduling team requesting a follow-up appointment, and our office will call the patient with this information.   

## 2017-03-10 NOTE — Progress Notes (Signed)
Progress Note  Patient Name: Andrew [name] Date of Encounter: 03/10/2017  Primary Cardiologist: Nelva Bush, MD   Subjective   Feeling well.  Breathing has improved.  No chest pain.  He walked with PT and had no chest pain no shortness of breath.  Inpatient Medications    Scheduled Meds: . aspirin EC  81 mg Oral Daily  . cholecalciferol  1,000 Units Oral Daily  . clopidogrel  75 mg Oral Daily  . famotidine  20 mg Oral QHS  . FLUoxetine  10 mg Oral Daily  . heparin injection (subcutaneous)  5,000 Units Subcutaneous Q8H  . isosorbide mononitrate  120 mg Oral QHS  . metoprolol succinate  50 mg Oral Daily  . mometasone-formoterol  2 puff Inhalation BID  . multivitamin  1 tablet Oral Daily  . pantoprazole  40 mg Oral Daily  . pravastatin  40 mg Oral q1800  . ranolazine  500 mg Oral BID  . senna-docusate  1 tablet Oral BID   Continuous Infusions: . sodium chloride 20 mL/hr (03/07/17 1835)   PRN Meds: acetaminophen, albuterol, ALPRAZolam, nitroGLYCERIN, ondansetron (ZOFRAN) IV, polyethylene glycol   Vital Signs    Vitals:   03/10/17 0101 03/10/17 0524 03/10/17 0842 03/10/17 0852  BP: 135/68 (!) 94/57 128/62 128/62  Pulse: 60  (!) 58 60  Resp: 18 20 18    Temp: 97.6 F (36.4 C) 98 F (36.7 C) 98 F (36.7 C)   TempSrc: Oral Oral Oral   SpO2: 92% 93%    Weight:  179 lb 6.4 oz (81.4 kg)    Height:        Intake/Output Summary (Last 24 hours) at 03/10/2017 0915 Last data filed at 03/10/2017 0500 Gross per 24 hour  Intake 240 ml  Output 2450 ml  Net -2210 ml   Filed Weights   03/08/17 0307 03/09/17 0244 03/10/17 0524  Weight: 181 lb 3.5 oz (82.2 kg) 180 lb 5.4 oz (81.8 kg) 179 lb 6.4 oz (81.4 kg)    Telemetry    Sinus rhythm, sinus bradycardia.  PVCs. - Personally Reviewed  ECG    Sinus bradycardia.  Rate 57 bpm.  LBBB. - Personally Reviewed  Physical Exam   VS:  BP 128/62   Pulse 60   Temp 98 F (36.7 C) (Oral)   Resp 18   Ht 5\' 9"  (1.753 m)    Wt 179 lb 6.4 oz (81.4 kg)   SpO2 93%   BMI 26.49 kg/m  , BMI Body mass index is 26.49 kg/m. GENERAL:  Well appearing.  No acute distress HEENT: Pupils equal round and reactive, fundi not visualized, oral mucosa unremarkable NECK:  No jugular venous distention, waveform within normal limits, carotid upstroke brisk and symmetric, no bruits LUNGS: Clear to auscultation bilaterally. HEART:  RRR.  PMI not displaced or sustained,S1 and S2 within normal limits, no S3, no S4, no clicks, no rubs, II/VI systolic murmur at the LUSB ABD:  Flat, positive bowel sounds normal in frequency in pitch, no bruits, no rebound, no guarding, no midline pulsatile mass, no hepatomegaly, no splenomegaly EXT:  2 plus pulses throughout, no edema, no cyanosis no clubbing SKIN:  No rashes no nodules NEURO:  Cranial nerves II through XII grossly intact, motor grossly intact throughout.  Doesn't remember meeting me yesterday PSYCH:  Memory deficits.   Labs    Chemistry Recent Labs  Lab 03/07/17 0201  03/08/17 0416 03/09/17 0247 03/10/17 0254  NA 138   < > 139 139  138  K 5.1   < > 4.6 5.0 4.2  CL 106   < > 104 102 106  CO2 20*   < > 23 25 21*  GLUCOSE 144*   < > 122* 116* 114*  BUN 27*   < > 31* 37* 40*  CREATININE 1.74*   < > 1.76* 2.15* 1.98*  CALCIUM 9.0   < > 9.1 9.1 9.2  PROT 6.2*  --   --   --   --   ALBUMIN 3.5  --   --   --   --   AST 58*  --   --   --   --   ALT 63  --   --   --   --   ALKPHOS 93  --   --   --   --   BILITOT 1.4*  --   --   --   --   GFRNONAA 32*   < > 32* 25* 28*  GFRAA 37*   < > 37* 29* 32*  ANIONGAP 12   < > 12 12 11    < > = values in this interval not displayed.     Hematology Recent Labs  Lab 03/08/17 0416 03/09/17 0247 03/10/17 0254  WBC 14.6* 11.5* 9.5  RBC 4.18* 4.00* 4.22  HGB 14.2 13.5 14.1  HCT 42.8 41.0 42.9  MCV 102.4* 102.5* 101.7*  MCH 34.0 33.8 33.4  MCHC 33.2 32.9 32.9  RDW 15.7* 16.0* 15.7*  PLT 97* 92* 94*    Cardiac Enzymes Recent Labs    Lab 03/07/17 0516 03/07/17 0929 03/07/17 1738 03/09/17 1045  TROPONINI 1.31* 1.59* 2.06* 1.30*    Recent Labs  Lab 03/07/17 0214  TROPIPOC 0.05     BNPNo results for input(s): BNP, PROBNP in the last 168 hours.   DDimer No results for input(s): DDIMER in the last 168 hours.   Radiology    No results found.  Cardiac Studies   Echo 03/08/17: - Left ventricle: Posterior lateral and inferior basal hypokinesis   The cavity size was mildly dilated. Wall thickness was increased   in a pattern of moderate LVH. Systolic function was mildly   reduced. The estimated ejection fraction was in the range of 45%   to 50%. Doppler parameters are consistent with both elevated   ventricular end-diastolic filling pressure and elevated left   atrial filling pressure. - Aortic valve: There was mild to moderate stenosis. There was mild   regurgitation. Valve area (VTI): 1.25 cm^2. Valve area (Vmax):   1.27 cm^2. Valve area (Vmean): 1.24 cm^2. - Mitral valve: There was mild regurgitation. - Left atrium: The atrium was mildly dilated. - Atrial septum: No defect or patent foramen ovale was identified. - Pulmonary arteries: PA peak pressure: 60 mm Hg (S).  Patient Profile     [name] is a 16M with CAD s/p CABG, moderate aortic stenosis, moderate aortic regurgitation, chronic systolic and diastolic heart failure, hypertension, hyperlipidemia, prior stroke, CKD III, and COPD here with recurrent NSTEMI.   Assessment & Plan    # NSTEMI: # Hyperlipidemia: [name] isn't a candidate for cath and isn't interested in having one.  Medical management. Ranexa was added to his regimen this admission.  He worked with physical therapy and has been chest pain-free.  Continue aspirin, clopidogrel, isosorbide mononitrate, metoprolol, and pravastatin.  Echo did not show any significant wall motion abnormalities.  Systolic function was improved from prior.    # Chronic  systolic and diastolic heart failure: #  Hypertensive heart disease: LVEF 45-50% increased from 40-45% last year.  Continue medications as above.  His renal function has started to improve.  Hold torsemide today and resume his home dose of 20 mg tomorrow.  # Aortic stenosis: Mild-moderate on echo this admission.  # Ascending aorta aneurysm: 4.0 cm this admission.  Mild.   [name] is stable for discharge today.  We will call him with outpatient follow-up.  For questions or updates, please contact [place] Please consult [email or web address] for contact info under Cardiology/STEMI.      Signed, Skeet Latch, MD  03/10/2017, 9:15 AM

## 2017-03-10 NOTE — Discharge Summary (Signed)
Physician Discharge Summary  Andrew [name] JJO:[id number] DOB: [birth year] DOA: 03/07/2017  PCP: [name], MD  Admit date: 03/07/2017 Discharge date: 03/10/2017  [facility]mitted From: [facility] Disposition:  [facility]   Recommendations for Outpatient Follow-up:  1. Follow up with PCP in 1 week with repeat CBC/BMP 2. Follow-up with cardiology.  Cardiology office will call the patient with appointment  Home Health: Yes Equipment/Devices: None  Discharge Condition: Stable CODE STATUS: DNR Diet recommendation: Heart Healthy   Brief/Interim Summary: 84 year old male with history of coronary artery disease status post CABG, chronic kidney disease stage III, chronic combined CHF and COPD presented with chest pain and shortness of breath.  Troponin was elevated.  Cardiology was consulted.  Heparin drip was started.  Ranexa was added.  Torsemide dose was increased by cardiology which was subsequently held because of slightly worsened renal function.  Patient is currently chest pain-free.  Cardiology has cleared the patient for discharge.   Discharge Diagnoses:  Principal Problem:   ACS (acute coronary syndrome) (HCC) Active Problems:   COPD, minimal-mild   OSA (obstructive sleep apnea)   Chronic renal insufficiency, stage III (moderate) (HCC)   CAD (coronary artery disease)   BOOP (bronchiolitis obliterans with organizing pneumonia) ([place])   New onset left bundle branch block (LBBB)    1.  Non-STEMI in a patient with history of coronary artery disease and CABG  -Cardiology following.  Echo showed ejection fraction of 45-50%. Currently chest pain-free.   -Continue aspirin, Plavix, metoprolol, nitrates, statin and Ranexa.  Was treated with heparin drip as well which has already been discontinued -Cardiology has cleared the patient for discharge.  Outpatient follow-up with cardiology  2.Chronic combined systolic & diastolic CHF -Patient's torsemide was  initially increased by cardiology which was then subsequently held yesterday due to slight bump in the creatinine.  Torsemide dose will be held today as well.  Torsemide can be resumed at 20 mg daily from 03/11/2017 as per cardiology recommendations.  Continue other meds as above.    3.CKD stage III -Creatinine slightly better today today. Torsemide plan as above.  Follow-up BMP as an outpatient  4.COPD -Stable, no wheezing or distress on admission -Continue inhalers  5.OSA -Continue CPAP  6.  Leukocytosis -Probably reactive.    Resolved     Discharge Instructions  Discharge Instructions    (HEART FAILURE PATIENTS) Call MD:  Anytime you have any of the following symptoms: 1) 3 pound weight gain in 24 hours or 5 pounds in 1 week 2) shortness of breath, with or without a dry hacking cough 3) swelling in the hands, feet or stomach 4) if you have to sleep on extra pillows at night in order to breathe.   Complete by:  As directed    Ambulatory referral to Cardiology   Complete by:  As directed    Chest pain/CHF followup   Call MD for:  difficulty breathing, headache or visual disturbances   Complete by:  As directed    Call MD for:  extreme fatigue   Complete by:  As directed    Call MD for:  hives   Complete by:  As directed    Call MD for:  persistant dizziness or light-headedness   Complete by:  As directed    Call MD for:  persistant nausea and vomiting   Complete by:  As directed    Call MD for:  severe uncontrolled pain   Complete by:  As directed  Call MD for:  temperature >100.4   Complete by:  As directed    Diet - low sodium heart healthy   Complete by:  As directed    Diet - low sodium heart healthy   Complete by:  As directed    Increase activity slowly   Complete by:  As directed    Increase activity slowly   Complete by:  As directed      Allergies as of 03/10/2017   No Known Allergies     Medication List    STOP taking these  medications   cephALEXin 500 MG capsule Commonly known as:  KEFLEX     TAKE these medications   acetaminophen 500 MG tablet Commonly known as:  TYLENOL Take 500 mg by mouth every 6 (six) hours as needed (pain).   albuterol 108 (90 Base) MCG/ACT inhaler Commonly known as:  PROVENTIL HFA;VENTOLIN HFA Inhale 2 puffs into the lungs every 6 (six) hours as needed for wheezing or shortness of breath.   aspirin 81 MG EC tablet Take 1 tablet (81 mg total) by mouth daily. Start taking on:  03/11/2017   budesonide-formoterol 80-4.5 MCG/ACT inhaler Commonly known as:  SYMBICORT Inhale 2 puffs into the lungs 2 (two) times daily.   cholecalciferol 1000 units tablet Commonly known as:  VITAMIN D Take 1,000 Units by mouth daily.   clopidogrel 75 MG tablet Commonly known as:  PLAVIX TAKE ONE TABLET BY MOUTH ONE TIME DAILY   famotidine 20 MG tablet Commonly known as:  PEPCID One at bedtime   FLUoxetine 10 MG tablet Commonly known as:  PROZAC Take 1 tablet (10 mg total) by mouth daily.   isosorbide mononitrate 120 MG 24 hr tablet Commonly known as:  IMDUR Take 1 tablet (120 mg total) by mouth at bedtime.   metoprolol succinate 50 MG 24 hr tablet Commonly known as:  TOPROL-XL Take 1 tablet (50 mg total) by mouth daily. Take with or immediately following a meal.   NEPHRO-VITE PO Take 1 tablet by mouth daily.   nitroGLYCERIN 0.4 MG/SPRAY spray Commonly known as:  NITROLINGUAL Place 1 spray under the tongue every 5 (five) minutes x 3 doses as needed for chest pain.   OVER THE COUNTER MEDICATION Place 1 drop into both eyes daily as needed (dry eyes). OTC lubricating eye drop   pantoprazole 40 MG tablet Commonly known as:  PROTONIX Take 1 tablet (40 mg total) by mouth daily. Take 30-60 min before first meal of the day   pravastatin 40 MG tablet Commonly known as:  PRAVACHOL Take 1 tablet (40 mg total) by mouth at bedtime.   PRESERVISION/LUTEIN Caps Take 1 capsule by mouth 2  (two) times daily.   ranolazine 500 MG 12 hr tablet Commonly known as:  RANEXA Take 1 tablet (500 mg total) by mouth 2 (two) times daily.   torsemide 20 MG tablet Commonly known as:  DEMADEX Take 1 tablet (20 mg total) by mouth daily. Resume Torsemide from 03/11/17 Start taking on:  03/11/2017 What changed:  additional instructions      Follow-up Information    End, Harrell Gave, MD Follow up.   Specialty:  Cardiology Why:  The office will call you for a follow-up appointment with either Dr. Saunders Revel or one of his assistants. Please call the office if you have not heard back within 3 days. Contact information: [address] [place] [address] [phone]        [name], MD. Schedule an  appointment as soon as possible for a visit in 1 week(s).   Specialty:  Family Medicine Why:  with repeat bmp Contact information: [place] [address] [phone]          No Known Allergies  Consultations:  Cardiology   Procedures/Studies: Dg Chest Portable 1 View  Result Date: 03/07/2017 CLINICAL DATA:  Chest pain this evening EXAM: PORTABLE CHEST 1 VIEW COMPARISON:  01/23/2017 chest CT, CXR 12/24/2016 FINDINGS: Borderline cardiomegaly. Aortic atherosclerosis without aneurysmal dilatation is noted. The patient is status post median sternotomy. Chronic interstitial prominence is noted of the lungs. Redemonstration of right perihilar opacities some which are vascular in etiology but others likely representing chronic areas of atelectasis, scarring and possible superimposed pneumonia. No significant effusion. No pneumothorax. Degenerative changes are seen along the dorsal spine and right shoulder. IMPRESSION: Borderline cardiomegaly with aortic atherosclerosis. Chronic opacities about the right hilum consistent with areas of atelectasis, scarring and vascular markings. Superimposed areas of pneumonic consolidation or not entirely excluded.  Electronically Signed   By: Ashley Royalty M.D.   On: 03/07/2017 02:36    Echo on 03/08/2017 Study Conclusions  - Left ventricle: Posterior lateral and inferior basal hypokinesis The cavity size was mildly dilated. Wall thickness was increased in a pattern of moderate LVH. Systolic function was mildly reduced. The estimated ejection fraction was in the range of 45% to 50%. Doppler parameters are consistent with both elevated ventricular end-diastolic filling pressure and elevated left atrial filling pressure. - Aortic valve: There was mild to moderate stenosis. There was mild regurgitation. Valve area (VTI): 1.25 cm^2. Valve area (Vmax): 1.27 cm^2. Valve area (Vmean): 1.24 cm^2. - Mitral valve: There was mild regurgitation. - Left atrium: The atrium was mildly dilated. - Atrial septum: No defect or patent foramen ovale was identified. - Pulmonary arteries: PA peak pressure: 60 mm Hg (S).     Subjective: Patient seen and examined at bedside.  He denies any current chest pain, shortness of breath, fever or nausea or vomiting.  Discharge Exam: Vitals:   03/10/17 0852 03/10/17 0928  BP: 128/62   Pulse: 60   Resp:    Temp:    SpO2:  97%   Vitals:   03/10/17 0524 03/10/17 0842 03/10/17 0852 03/10/17 0928  BP: (!) 94/57 128/62 128/62   Pulse:  (!) 58 60   Resp: 20 18    Temp: 98 F (36.7 C) 98 F (36.7 C)    TempSrc: Oral Oral    SpO2: 93%   97%  Weight: 81.4 kg (179 lb 6.4 oz)     Height:        General: Pt is alert, awake, not in acute distress Cardiovascular: Rate controlled S1/S2 + Respiratory: Bilateral decreased breath sounds at bases Abdominal: Soft, NT, ND, bowel sounds + Extremities: Trace edema, no cyanosis    The results of significant diagnostics from this hospitalization (including imaging, microbiology, ancillary and laboratory) are listed below for reference.     Microbiology: No results found for this or any previous visit (from the  past 240 hour(s)).   Labs: BNP (last 3 results) Recent Labs    10/09/16 0458  BNP 440.1*   Basic Metabolic Panel: Recent Labs  Lab 03/07/17 0201 03/07/17 0216 03/07/17 0516 03/08/17 0416 03/09/17 0247 03/10/17 0254  NA 138 138 138 139 139 138  K 5.1 5.8* 4.9  4.9 4.6 5.0 4.2  CL 106 105 106 104 102 106  CO2 20*  --  21* 23 25 21*  GLUCOSE 144* 143* 128* 122* 116* 114*  BUN 27* 35* 23* 31* 37* 40*  CREATININE 1.74* 1.70* 1.70* 1.76* 2.15* 1.98*  CALCIUM 9.0  --  9.0 9.1 9.1 9.2  MG  --   --   --  2.1 2.1 2.1   Liver Function Tests: Recent Labs  Lab 03/07/17 0201  AST 58*  ALT 63  ALKPHOS 93  BILITOT 1.4*  PROT 6.2*  ALBUMIN 3.5   No results for input(s): LIPASE, AMYLASE in the last 168 hours. No results for input(s): AMMONIA in the last 168 hours. CBC: Recent Labs  Lab 03/07/17 0201 03/07/17 0216 03/08/17 0416 03/09/17 0247 03/10/17 0254  WBC 9.9  --  14.6* 11.5* 9.5  NEUTROABS 8.3*  --   --   --   --   HGB 14.6 15.0 14.2 13.5 14.1  HCT 43.5 44.0 42.8 41.0 42.9  MCV 102.1*  --  102.4* 102.5* 101.7*  PLT 105*  --  97* 92* 94*   Cardiac Enzymes: Recent Labs  Lab 03/07/17 0201 03/07/17 0516 03/07/17 0929 03/07/17 1738 03/09/17 1045  TROPONINI 0.07* 1.31* 1.59* 2.06* 1.30*   BNP: Invalid input(s): POCBNP CBG: No results for input(s): GLUCAP in the last 168 hours. D-Dimer No results for input(s): DDIMER in the last 72 hours. Hgb A1c No results for input(s): HGBA1C in the last 72 hours. Lipid Profile No results for input(s): CHOL, HDL, LDLCALC, TRIG, CHOLHDL, LDLDIRECT in the last 72 hours. Thyroid function studies No results for input(s): TSH, T4TOTAL, T3FREE, THYROIDAB in the last 72 hours.  Invalid input(s): FREET3 Anemia work up No results for input(s): VITAMINB12, FOLATE, FERRITIN, TIBC, IRON, RETICCTPCT in the last 72 hours. Urinalysis    Component Value Date/Time   COLORURINE STRAW (A) 03/08/2017 1341   APPEARANCEUR CLEAR  03/08/2017 1341   LABSPEC 1.005 03/08/2017 1341   PHURINE 6.0 03/08/2017 1341   GLUCOSEU NEGATIVE 03/08/2017 1341   GLUCOSEU NEGATIVE 07/05/2009 0858   HGBUR NEGATIVE 03/08/2017 1341   BILIRUBINUR NEGATIVE 03/08/2017 1341   KETONESUR NEGATIVE 03/08/2017 1341   PROTEINUR NEGATIVE 03/08/2017 1341   UROBILINOGEN 0.2 07/05/2009 0858   NITRITE NEGATIVE 03/08/2017 1341   LEUKOCYTESUR NEGATIVE 03/08/2017 1341   Sepsis Labs Invalid input(s): PROCALCITONIN,  WBC,  LACTICIDVEN Microbiology No results found for this or any previous visit (from the past 240 hour(s)).   Time coordinating discharge: 35 minutes  SIGNED:   Aline August, MD  [facility] 03/10/2017, 11:13 AM Pager: [phone]  If 7PM-7AM, please contact night-coverage [email or web address] Password [email or web address]

## 2017-03-11 ENCOUNTER — Encounter (HOSPITAL_COMMUNITY): Payer: Medicare Other

## 2017-03-11 ENCOUNTER — Ambulatory Visit: Payer: Medicare Other | Admitting: Family

## 2017-03-11 ENCOUNTER — Telehealth: Payer: Self-pay | Admitting: *Deleted

## 2017-03-11 NOTE — Telephone Encounter (Signed)
-----   Message from Charlie Pitter, PA-C sent at 03/10/2017  9:35 AM EST ----- Regarding: Needs TOC follow-up 1-2 weeks Please schedule this patient for a follow-up appointment and call them with that information.  Primary Cardiologist: Dr. Saunders Revel Date of Discharge: 03/10/2017 Appointment Needed Within: 1-2 weeks  Appointment Type: TOC  Thank you! Dayna Dunn PA-C

## 2017-03-12 ENCOUNTER — Telehealth: Payer: Self-pay

## 2017-03-12 NOTE — Telephone Encounter (Signed)
Transition Care Management Follow-up Telephone Call   Date discharged? 03/10/2017   How have you been since you were released from the hospital? "I just don't feel good"   Do you understand why you were in the hospital? yes   Do you understand the discharge instructions? yes   Where were you discharged to? Lives in assisted living with wife.    Items Reviewed:  Medications reviewed: no, meds/list not available at time of call.   Allergies reviewed: yes  Dietary changes reviewed: yes  Referrals reviewed: yes   Functional Questionnaire:   Activities of Daily Living (ADLs):   He states they are independent in the following: ambulation, bathing and hygiene, feeding, continence, grooming, toileting and dressing States they require assistance with the following: None   Any transportation issues/concerns?: no   Any patient concerns? no   Confirmed importance and date/time of follow-up visits scheduled yes  Provider Appointment booked with PCP on 03/25/2017.   Confirmed with patient if condition begins to worsen call PCP or go to the ER.  Patient was given the office number and encouraged to call back with question or concerns.  : yes

## 2017-03-13 ENCOUNTER — Encounter: Payer: Self-pay | Admitting: Internal Medicine

## 2017-03-13 ENCOUNTER — Ambulatory Visit: Payer: Medicare Other | Admitting: Family Medicine

## 2017-03-13 ENCOUNTER — Ambulatory Visit (INDEPENDENT_AMBULATORY_CARE_PROVIDER_SITE_OTHER)
Admission: RE | Admit: 2017-03-13 | Discharge: 2017-03-13 | Disposition: A | Discharge status: Home / Self Care | Payer: Medicare Other | Source: Ambulatory Visit | Attending: Internal Medicine | Admitting: Internal Medicine

## 2017-03-13 ENCOUNTER — Ambulatory Visit (INDEPENDENT_AMBULATORY_CARE_PROVIDER_SITE_OTHER): Payer: Medicare Other | Admitting: Internal Medicine

## 2017-03-13 ENCOUNTER — Telehealth: Payer: Self-pay | Admitting: Internal Medicine

## 2017-03-13 ENCOUNTER — Telehealth: Payer: Self-pay

## 2017-03-13 VITALS — BP 108/64 | HR 54 | Ht 69.0 in | Wt 179.0 lb

## 2017-03-13 DIAGNOSIS — R918 Other nonspecific abnormal finding of lung field: Secondary | ICD-10-CM

## 2017-03-13 DIAGNOSIS — J45991 Cough variant asthma: Secondary | ICD-10-CM

## 2017-03-13 NOTE — Assessment & Plan Note (Addendum)
Onset chronic cough mid summer 2018  - speech therapy eval 10/12/16  Ok  - CT chest 01/23/17 c/w migrating infiltrates  cxr 03/13/2017 marked improvement infiltrates and back to baseline appearance > no directed f/u needed   Discussed in detail all the  indications, usual  risks and alternatives  relative to the benefits with patient/fmwho agree  to proceed with conservative f/u as outlined

## 2017-03-13 NOTE — Assessment & Plan Note (Addendum)
02/13/2017  After extensive coaching inhaler device  effectiveness =    50% > try symbicort 80 2bid  - 03/13/2017  After extensive coaching inhaler device  effectiveness =    50% with spacer > continue symbicort 80 2bid  Improved somewhat despite poor hfa but concerned about cost of symbicort   Reviewed Formulary restrictions will be an ongoing challenge for the forseable future and I would be happy to pick an alternative if the pt will first  provide me a list of them but pt  will need to return here for training for any new device that is required eg dpi vs hfa vs respimat.    In meantime we can always provide samples so the patient never runs out of any needed respiratory medications.    I had an extended discussion with the patient reviewing all relevant studies completed to date and  lasting 15 to 20 minutes of a 25 minute visit    Each maintenance medication was reviewed in detail including most importantly the difference between maintenance and prns and under what circumstances the prns are to be triggered using an action plan format that is not reflected in the computer generated alphabetically organized AVS.    Please see AVS for specific instructions unique to this visit that I personally wrote and verbalized to the the pt in detail and then reviewed with pt  by my nurse highlighting any  changes in therapy recommended at today's visit to their plan of care.

## 2017-03-13 NOTE — Patient Instructions (Addendum)
Please remember to go to the  x-ray department downstairs in the basement  for your tests - we will call you with the results when they are available.  Work on inhaler technique:  relax and gently blow all the way out then take a nice smooth deep breath back in, triggering the inhaler right before  you start breathing in.  Hold for up to 5 seconds if you can. Blow out thru nose. Rinse and gargle with water when done          Please schedule a follow up office visit in 6 weeks, call sooner if needed

## 2017-03-13 NOTE — Progress Notes (Signed)
Subjective:     Patient ID: Andrew [name], male   DOB: Jan 10, 1924,   MRN: [id number]    Brief patient profile:  50 yowm  Quit smoking 1963 with indolent  onset of cough Mid summer 2018 much worse moved in to newly painted in Aug 2018 [facility]  And worse after Sept 2018 ? Some better with  Abx/ ? Some ventolin and referred to pulmonary clinic 02/13/2017 by Dr   Edilia Bo for abnormal  Ct     History of Present Illness  02/13/2017 1st [facility] office visit/ Andrew [name]   Chief Complaint  Patient presents with  . Advice Only    referred Dr Lillia Pauls 2 abnormal CT scanshas had non productive cough, uses albuterol inhaler  fm has noted memory problems since onset of cough  Cough is worst x 15 min p hs / ? Ventolin helps / dry  Doe worse x 50 ft  Sleeps on 2 pillows ok after 1st 15 min No worse with meals "most of the time" and never coughs up food or brings up food  rec Pantoprazole (protonix) 40 mg   Take  30-60 min before first meal of the day and Pepcid (famotidine)  20 mg one @   GERD diet   Plan A = Automatic = Symbicort 80 Take 2 puffs first thing in am and then another 2 puffs about 12 hours later.  Work on inhaler technique:  Plan B = Backup Only use your albuterol as a rescue medication Migrating infiltrates are typical of BOOP or recurrent aspiration  Please schedule a follow up office visit in 4 weeks, sooner if needed  With cxr on return    03/13/2017  f/u ov/Lichelle Viets re:  Pulmonary infiltrates/ no 02  Chief Complaint  Patient presents with  . Follow-up    Breathing is unchanged since his last visit. He is coughing less.    Dyspnea:  Limited by L hip Cough: improved esp daytime Sleep: 2pillows   Got steroid shot for hip ? Helped breathing    No obvious day to day or daytime variability or assoc excess/ purulent sputum or mucus plugs or hemoptysis or cp or chest tightness, subjective wheeze or overt sinus or hb symptoms. No unusual exposure hx or h/o childhood pna/  asthma or knowledge of premature birth.  Sleeping  2 pillows  without nocturnal  or early am exacerbation  of respiratory  c/o's or need for noct saba. Also denies any obvious fluctuation of symptoms with weather or environmental changes or other aggravating or alleviating factors except as outlined above   Current Allergies, Complete Past Medical History, Past Surgical History, Family History, and Social History were reviewed in [employer] record.  ROS  The following are not active complaints unless bolded Hoarseness, sore throat, dysphagia, dental problems, itching, sneezing,  nasal congestion or discharge of excess mucus or purulent secretions, ear ache,   fever, chills, sweats, unintended wt loss or wt gain, classically pleuritic or exertional cp,  orthopnea pnd or leg swelling, presyncope, palpitations, abdominal pain, anorexia, nausea, vomiting, diarrhea  or change in bowel habits or change in bladder habits, change in stools or change in urine, dysuria, hematuria,  rash, arthralgias, visual complaints, headache, numbness, weakness or ataxia or problems with walking or coordination,  change in mood/affect or memory.        Current Meds  Medication Sig  . acetaminophen (TYLENOL) 500 MG tablet Take 500 mg by mouth every 6 (six) hours  as needed (pain).  Marland Kitchen albuterol (PROVENTIL HFA;VENTOLIN HFA) 108 (90 Base) MCG/ACT inhaler Inhale 2 puffs into the lungs every 6 (six) hours as needed for wheezing or shortness of breath.  Marland Kitchen aspirin 81 MG EC tablet Take 1 tablet (81 mg total) by mouth daily.  . B Complex-C-Folic Acid (NEPHRO-VITE PO) Take 1 tablet by mouth daily.    . budesonide-formoterol (SYMBICORT) 80-4.5 MCG/ACT inhaler Inhale 2 puffs into the lungs 2 (two) times daily.  . cholecalciferol (VITAMIN D) 1000 units tablet Take 1,000 Units by mouth daily.  . clopidogrel (PLAVIX) 75 MG tablet TAKE ONE TABLET BY MOUTH ONE TIME DAILY   . famotidine (PEPCID) 20 MG tablet One at  bedtime  . FLUoxetine (PROZAC) 10 MG tablet Take 1 tablet (10 mg total) by mouth daily.  . isosorbide mononitrate (IMDUR) 120 MG 24 hr tablet Take 1 tablet (120 mg total) by mouth at bedtime.  . metoprolol succinate (TOPROL-XL) 50 MG 24 hr tablet Take 1 tablet (50 mg total) by mouth daily. Take with or immediately following a meal.  . Multiple Vitamins-Minerals (PRESERVISION/LUTEIN) CAPS Take 1 capsule by mouth 2 (two) times daily.  . nitroGLYCERIN (NITROLINGUAL) 0.4 MG/SPRAY spray Place 1 spray under the tongue every 5 (five) minutes x 3 doses as needed for chest pain.  Marland Kitchen OVER THE COUNTER MEDICATION Place 1 drop into both eyes daily as needed (dry eyes). OTC lubricating eye drop  . pantoprazole (PROTONIX) 40 MG tablet Take 1 tablet (40 mg total) by mouth daily. Take 30-60 min before first meal of the day  . pravastatin (PRAVACHOL) 40 MG tablet Take 1 tablet (40 mg total) by mouth at bedtime.  . ranolazine (RANEXA) 500 MG 12 hr tablet Take 1 tablet (500 mg total) by mouth 2 (two) times daily.  Marland Kitchen torsemide (DEMADEX) 20 MG tablet Take 1 tablet (20 mg total) by mouth daily. Resume Torsemide from 03/11/17                 Objective:   Physical Exam     Frail hoarse elderly  W/c bound wm nad   03/13/2017        179 02/13/17 180 lb (81.6 kg)  01/30/17 180 lb 4 oz (81.8 kg)  01/17/17 181 lb 4 oz (82.2 kg)     Vital signs reviewed - Note on arrival 02 sats  95% on RA        LUNGS: no acc muscle use,  Nl contour chest with minimal insp and exp rhonchi bilaterally ? Cough urge on deep inspiration   HEENT: nl dentition, turbinates bilaterally, and oropharynx. Nl external ear canals without cough reflex   NECK :  without JVD/Nodes/TM/ nl carotid upstrokes bilaterally   LUNGS: no acc muscle use,  Nl contour chest which is clear to A and P bilaterally without cough on insp or exp maneuvers   CV:  RRR  no s3 or murmur or increase in P2, and no edema   ABD:  soft and nontender with nl  inspiratory excursion in the supine position. No bruits or organomegaly appreciated, bowel sounds nl  MS:    ext warm without deformities, calf tenderness, cyanosis or clubbing No obvious joint restrictions   SKIN: warm and dry without lesions    NEURO:  Alert but  Seems easily confused with details of care  with  no motor or cerebellar deficits apparent.           I personally reviewed images and agree with radiology  impression as follows:   Chest CT with contrast 01/23/17 Persistent infiltrates in RIGHT lung, increased in RIGHT upper lobe and improved in RIGHT middle and RIGHT lower lobes since previous exam ; continued assessment until resolution recommended to exclude underlying abnormalities including tumor.   CXR PA and Lateral:   03/13/2017 :    I personally reviewed images and agree with radiology impression as follows:    Perihilar opacities are mildly decreased from prior chest radiograph. Stable cardiomegaly My review:  Very impressive CM/ all the obvious acute changes have resolved at this point           Assessment:

## 2017-03-13 NOTE — Telephone Encounter (Signed)
-----   Message from Merlene Laughter, LPN sent at 2/44/6286  4:36 PM EST -----   ----- Message ----- From: Merlene Laughter, LPN Sent: 3/81/7711   4:35 PM To: Merlene Laughter, LPN

## 2017-03-13 NOTE — Telephone Encounter (Signed)
Printed and gave to scheduler.   Asked her to make [facility] appointment for patient and then route to triage so that a TOC call can be made.

## 2017-03-13 NOTE — Telephone Encounter (Signed)
New message   TOC appt for 03-25-2017 at 9:00am with [facility]

## 2017-03-13 NOTE — Telephone Encounter (Signed)
Patient contacted regarding discharge from  [facility] on 03/10/17  Patient understands to follow up with provider Bhagat PA on 03/25/17 at 9:00 am at [address] Patient understands discharge instructions? yes Patient understands medications and regiment? yes Patient understands to bring all medications to this visit? yes

## 2017-03-13 NOTE — Telephone Encounter (Signed)
Patient contacted regarding discharge from [facility] on 03/10/17.  Patient understands to follow up with provider Robbie Lis on 03/25/17 at 9 am at [facility] in [place].. Patient understands discharge instructions? yes Patient understands medications and regiment? yes Patient understands to bring all medications to this visit? yes  S/w the pts wife Rod Holler [facility]) who states that the pt is doing well and is without any complaints at this time. They do have [place] phone number to call if they have any questions or concerns.

## 2017-03-13 NOTE — Progress Notes (Signed)
Spoke with pt's spouse and notified of results per Dr. Wert. Pt verbalized understanding and denied any questions. 

## 2017-03-14 ENCOUNTER — Encounter: Payer: Self-pay | Admitting: Internal Medicine

## 2017-03-25 ENCOUNTER — Other Ambulatory Visit: Payer: Self-pay

## 2017-03-25 ENCOUNTER — Ambulatory Visit: Payer: Medicare Other | Admitting: Physician Assistant

## 2017-03-25 ENCOUNTER — Ambulatory Visit: Payer: Medicare Other | Admitting: Family Medicine

## 2017-03-25 ENCOUNTER — Encounter: Payer: Self-pay | Admitting: Family Medicine

## 2017-03-25 VITALS — BP 112/60 | HR 55 | Temp 98.4°F | Ht 66.0 in | Wt 184.8 lb

## 2017-03-25 DIAGNOSIS — N183 Chronic kidney disease, stage 3 unspecified: Secondary | ICD-10-CM

## 2017-03-25 DIAGNOSIS — I214 Non-ST elevation (NSTEMI) myocardial infarction: Secondary | ICD-10-CM

## 2017-03-25 DIAGNOSIS — I5022 Chronic systolic (congestive) heart failure: Secondary | ICD-10-CM | POA: Diagnosis not present

## 2017-03-25 DIAGNOSIS — D72829 Elevated white blood cell count, unspecified: Secondary | ICD-10-CM

## 2017-03-25 LAB — CBC WITH DIFFERENTIAL/PLATELET
BASOS ABS: 0.1 10*3/uL (ref 0.0–0.1)
BASOS PCT: 0.8 % (ref 0.0–3.0)
EOS ABS: 0.2 10*3/uL (ref 0.0–0.7)
Eosinophils Relative: 2.8 % (ref 0.0–5.0)
HEMATOCRIT: 43.4 % (ref 39.0–52.0)
HEMOGLOBIN: 14.5 g/dL (ref 13.0–17.0)
LYMPHS PCT: 15 % (ref 12.0–46.0)
Lymphs Abs: 1.2 10*3/uL (ref 0.7–4.0)
MCHC: 33.4 g/dL (ref 30.0–36.0)
MCV: 105.2 fl — ABNORMAL HIGH (ref 78.0–100.0)
MONO ABS: 0.7 10*3/uL (ref 0.1–1.0)
Monocytes Relative: 9.4 % (ref 3.0–12.0)
Neutro Abs: 5.6 10*3/uL (ref 1.4–7.7)
Neutrophils Relative %: 72 % (ref 43.0–77.0)
PLATELETS: 102 10*3/uL — AB (ref 150.0–400.0)
RBC: 4.12 Mil/uL — ABNORMAL LOW (ref 4.22–5.81)
RDW: 16.3 % — AB (ref 11.5–15.5)
WBC: 7.7 10*3/uL (ref 4.0–10.5)

## 2017-03-25 LAB — BASIC METABOLIC PANEL
BUN: 28 mg/dL — AB (ref 6–23)
CALCIUM: 8.9 mg/dL (ref 8.4–10.5)
CO2: 28 mEq/L (ref 19–32)
CREATININE: 1.53 mg/dL — AB (ref 0.40–1.50)
Chloride: 107 mEq/L (ref 96–112)
GFR: 45.46 mL/min — ABNORMAL LOW (ref >60.00)
Glucose, Bld: 97 mg/dL (ref 70–99)
Potassium: 5 mEq/L (ref 3.5–5.1)
Sodium: 140 mEq/L (ref 135–145)

## 2017-03-25 LAB — BRAIN NATRIURETIC PEPTIDE: PRO B NATRI PEPTIDE: 1050 pg/mL — AB (ref 0.0–100.0)

## 2017-03-25 NOTE — Progress Notes (Signed)
Dr. Frederico Hamman T. Nyron Mozer, MD, [facility] [place], [address] Phone: [phone] Fax: [phone]  03/25/2017  Patient: Andrew [name], MRN: [id number], DOB: 1923-11-06, 84 y.o.  Primary Physician:  [name], MD   Chief Complaint  Patient presents with  . Hospitalization Follow-up   Subjective:   Andrew [name] is a 84 y.o. very pleasant male patient who presents with the following:   Admit date: 03/07/2017 Discharge date: 03/10/2017  On the date of admission, the patient was admitted with chest pain.  He also had shortness of breath, [facility]  Does have a history of multiple MIs in the past, status post CABG, combined CHF, COPD, as well as chronic kidney disease stage III.  He had a elevated troponin in the ER, and he was started on a heparin drip.  Ranexa was also started.  Elevation of torsemide dose was increased by cardiology, and the patient's creatinine worsened while he was in the hospital.  At the time of discharge he was chest pain-free, and was medically managed.  Does not remember being in the hospital.   End STEMI, continue aspirin, Plavix, metoprolol, nitrites, statin, as well as Ranexa.  He will have outpatient cardiology follow-up, primary cardiologist is Dr. and.  Initially the patient was given higher doses of torsemide in the hospital and this was stopped secondary to elevation in creatinine greater than 2.  Baseline home dose is 20 mg of torsemide daily.  Past Medical History, Surgical History, Social History, Family History, Problem List, Medications, and Allergies have been reviewed and updated if relevant.  Patient Active Problem List   Diagnosis Date Noted  . Advanced Planning: DNR 04/09/2016    Priority: High  . Right-sided cerebrovascular accident (CVA) [facility])     Priority: High  . Tachycardia-bradycardia syndrome ([place]) 08/19/2012    Priority: High  . PVD- moderate  carotid disease 03/07/2010    Priority: High  . Angina, class II ([place]) 06/18/2008    Priority: High  . Hx of CABG 06/18/2008    Priority: High  . Nonrheumatic aortic valve stenosis 06/18/2008    Priority: High  . Chronic renal insufficiency, stage III (moderate) (HCC) 06/02/2014    Priority: Medium  . Macular degeneration (senile) of retina 04/29/2014    Priority: Medium  . TIA (transient ischemic attack) 06/03/2013    Priority: Medium  . COPD, minimal-mild 12/22/2010    Priority: Medium  . Spinal stenosis, lumbar region, with neurogenic claudication 07/13/2009    Priority: Medium  . New onset left bundle branch block (LBBB) 03/07/2017  . ACS (acute coronary syndrome) ([place]) 03/07/2017  . Coronary artery disease of native heart with stable angina pectoris ([place]) 02/23/2017  . BOOP (bronchiolitis obliterans with organizing pneumonia) ([place]) 02/23/2017  . Hyperkalemia 02/14/2017  . Pulmonary infiltrates on CXR 02/13/2017  . Dyspnea on exertion 02/13/2017  . Cough variant asthma vs UACS 02/13/2017  . Vascular dementia without behavioral disturbance 12/25/2016  . Chronic combined systolic and diastolic heart failure ([place])   . NSTEMI (non-ST elevated myocardial infarction) ([place]) 10/09/2016  . CAD (coronary artery disease) 06/28/2016  . Bilateral carotid artery stenosis 06/28/2016  . Hyperlipidemia LDL goal <70 06/28/2016  . Essential hypertension   . Alcohol abuse   . Thrombocytopenia ([place])   . Major depressive disorder, single episode 06/11/2013  . Pancreatic cyst 01/29/2013  . OSA (obstructive sleep apnea) 12/26/2012  . S/P excision of acoustic neuroma  06/18/2010  . Pulmonary nodule 06/17/2010  . Vertigo 06/14/2010  . THYROID NODULE 03/13/2010  . ALLERGIC RHINITIS 09/01/2008  . COLONIC POLYPS, HX OF 09/01/2008  . HYPERCHOLESTEROLEMIA 06/18/2008  . GASTROESOPHAGEAL REFLUX DISEASE 06/18/2008    Past Medical History:  Diagnosis Date  . Allergic rhinitis   . AORTIC STENOSIS     . BPV (benign positional vertigo)   . CAD, ARTERY BYPASS GRAFT   . CAROTID ARTERY STENOSIS 03/07/2010   80%  . CHF (congestive heart failure) ([place])   . COPD, mild ([place]) 12/22/2010  . GASTROESOPHAGEAL REFLUX DISEASE   . History of colonic polyps    1999, 2004  . History of prostate cancer 2002   s/p treatment with seeds / radiation  . HYPERCHOLESTEROLEMIA   . Legally blind   . Loss of hearing    left,  . Lumbar spinal stenosis 07/13/2009  . Macular degeneration (senile) of retina 04/29/2014  . Pancreatic cyst 01/29/2013   Noted on MRI scan from [facility] on 07/27/2011.  I reviewed report at patient request on 01-29-2013.  "Small cystic focus in the posterior pancreatic head.  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  Follow up MRI is advised. The main pancreatic duct remains normal in appearance."  Patient do  . S/P excision of acoustic neuroma   . Skin cancer   . Stroke ([place])   . Thyroid nodule 05/2010   Abnormal biopsy, 84 year old patient and his wife have made informed decision to not pursue surgical resection. Potential risk including cancer has been thoroughly discussed with the patient.    Past Surgical History:  Procedure Laterality Date  . CATARACT EXTRACTION    . CATARACT EXTRACTION  2010  . CHOLECYSTECTOMY    . CORONARY ARTERY BYPASS GRAFT    . CRANIECTOMY FOR EXCISION OF ACOUSTIC NEUROMA  1985  . EYE SURGERY    . SHOULDER SURGERY    . TONSILLECTOMY AND ADENOIDECTOMY  1932  . TOTAL KNEE ARTHROPLASTY    . transperineal implatation of palladium      Social History   Socioeconomic History  . Marital status: Married    Spouse name: Not on file  . Number of children: Not on file  . Years of education: Not on file  . Highest education level: Not on file  Social Needs  . Financial resource strain: Not on file  . Food insecurity - worry: Not on file  . Food  insecurity - inability: Not on file  . Transportation needs - medical: Not on file  . Transportation needs - non-medical: Not on file  Occupational History  . 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 Smoking status: Former Smoker    Packs/day: 2.00    Years: 20.00    Pack years: 40.00    Types: Cigarettes    Last attempt to quit: 01/22/1961    Years since quitting: 56.2  . Smokeless tobacco: Never Used  Substance and Sexual Activity  . Alcohol use: Yes    Alcohol/week: 4.2 oz    Types: 7 Shots of liquor per week    Comment: 1 drink nightly  . Drug use: No  . Sexual activity: No  Other Topics Concern  . Not on file  Social History Narrative  . Not on file    Family History  Problem  Relation Age of Onset  . Emphysema Brother   . Lung cancer Brother   . Heart disease Father   . Hypertension Father   . Heart attack Father   . Colon cancer Sister   . Non-Hodgkin's lymphoma Sister   . Heart disease Brother   . Colon cancer Sister   . Skin cancer Sister   . Alcohol abuse Unknown   . Arthritis Unknown   . Cancer Unknown   . Macular degeneration Unknown   . Lung cancer Daughter     No Known Allergies  Medication list reviewed and updated in full in [place].   GEN: as above No cp GI: No n/v/d, eating normally Pulm: No SOB Interactive and getting along well at home.  Otherwise, ROS is as per the HPI.  Objective:   BP 112/60   Pulse (!) 55   Temp 98.4 F (36.9 C) (Oral)   Ht 5\' 6"  (1.676 m)   Wt 184 lb 12 oz (83.8 kg)   BMI 29.82 kg/m   GEN: WDWN, NAD, Non-toxic, A & O x 3 HEENT: Atraumatic, Normocephalic. Neck supple. No masses, No LAD. Ears and Nose: No external deformity. CV: RRR, No M/G/R. No JVD. No thrill. No extra heart sounds. PULM: CTA B, no wheezes, crackles, rhonchi. No retractions. No resp. distress. No accessory muscle use. EXTR: No c/c/e - currently no edema NEURO Normal gait.    PSYCH: Normally interactive. Conversant. Not depressed or anxious appearing.  Calm demeanor.   Laboratory and Imaging Data: Results for orders placed or performed in visit on 95/62/13  Basic metabolic panel  Result Value Ref Range   Sodium 140 135 - 145 mEq/L   Potassium 5.0 3.5 - 5.1 mEq/L   Chloride 107 96 - 112 mEq/L   CO2 28 19 - 32 mEq/L   Glucose, Bld 97 70 - 99 mg/dL   BUN 28 (H) 6 - 23 mg/dL   Creatinine, Ser 1.53 (H) 0.40 - 1.50 mg/dL   Calcium 8.9 8.4 - 10.5 mg/dL   GFR 45.46 (L) >60.00 mL/min  CBC with Differential/Platelet  Result Value Ref Range   WBC 7.7 4.0 - 10.5 K/uL   RBC 4.12 (L) 4.22 - 5.81 Mil/uL   Hemoglobin 14.5 13.0 - 17.0 g/dL   HCT 43.4 39.0 - 52.0 %   MCV 105.2 (H) 78.0 - 100.0 fl   MCHC 33.4 30.0 - 36.0 g/dL   RDW 16.3 (H) 11.5 - 15.5 %   Platelets 102.0 (L) 150.0 - 400.0 K/uL   Neutrophils Relative % 72.0 43.0 - 77.0 %   Lymphocytes Relative 15.0 12.0 - 46.0 %   Monocytes Relative 9.4 3.0 - 12.0 %   Eosinophils Relative 2.8 0.0 - 5.0 %   Basophils Relative 0.8 0.0 - 3.0 %   Neutro Abs 5.6 1.4 - 7.7 K/uL   Lymphs Abs 1.2 0.7 - 4.0 K/uL   Monocytes Absolute 0.7 0.1 - 1.0 K/uL   Eosinophils Absolute 0.2 0.0 - 0.7 K/uL   Basophils Absolute 0.1 0.0 - 0.1 K/uL  Brain natriuretic peptide  Result Value Ref Range   Pro B Natriuretic peptide (BNP) 1,050.0 (H) 0.0 - 100.0 pg/mL     Assessment and Plan:   NSTEMI (non-ST elevated myocardial infarction) (HCC) - Plan: Brain natriuretic peptide  CKD (chronic kidney disease) stage 3, GFR 30-59 ml/min (HCC) - Plan: Basic metabolic panel  Leukocytosis, unspecified type - Plan: CBC with Differential/Platelet  Chronic systolic CHF (congestive heart failure) (HCC) -  Plan: Brain natriuretic peptide  Medically managed N STEMI.  Continue with cardiac regimen per cardiology.  Chronic kidney disease has improved.  Leukocytosis has resolved.  On exam, the patient has no pulmonary edema, and he has no edema  in his extremities.  I did check a cardiac BNP, and he does have known combined heart failure.  I am going to have his wife check daily weights.  For now continue current torsemide 20 mg dosage  Follow-up: Return in about 2 months (around 05/25/2017).  Orders Placed This Encounter  Procedures  . Basic metabolic panel  . CBC with Differential/Platelet  . Brain natriuretic peptide    Signed,  Tanikka Bresnan T. Revanth Neidig, MD   Allergies as of 03/25/2017   No Known Allergies     Medication List        Accurate as of 03/25/17 11:59 PM. Always use your most recent med list.          acetaminophen 500 MG tablet Commonly known as:  TYLENOL Take 500 mg by mouth every 6 (six) hours as needed (pain).   albuterol 108 (90 Base) MCG/ACT inhaler Commonly known as:  PROVENTIL HFA;VENTOLIN HFA Inhale 2 puffs into the lungs every 6 (six) hours as needed for wheezing or shortness of breath.   aspirin 81 MG EC tablet Take 1 tablet (81 mg total) by mouth daily.   budesonide-formoterol 80-4.5 MCG/ACT inhaler Commonly known as:  SYMBICORT Inhale 2 puffs into the lungs 2 (two) times daily.   cholecalciferol 1000 units tablet Commonly known as:  VITAMIN D Take 1,000 Units by mouth daily.   clopidogrel 75 MG tablet Commonly known as:  PLAVIX TAKE ONE TABLET BY MOUTH ONE TIME DAILY   famotidine 20 MG tablet Commonly known as:  PEPCID One at bedtime   FLUoxetine 10 MG tablet Commonly known as:  PROZAC Take 1 tablet (10 mg total) by mouth daily.   isosorbide mononitrate 120 MG 24 hr tablet Commonly known as:  IMDUR Take 1 tablet (120 mg total) by mouth at bedtime.   metoprolol succinate 50 MG 24 hr tablet Commonly known as:  TOPROL-XL Take 1 tablet (50 mg total) by mouth daily. Take with or immediately following a meal.   NEPHRO-VITE PO Take 1 tablet by mouth daily.   nitroGLYCERIN 0.4 MG/SPRAY spray Commonly known as:  NITROLINGUAL Place 1 spray under the tongue every 5 (five) minutes x  3 doses as needed for chest pain.   OVER THE COUNTER MEDICATION Place 1 drop into both eyes daily as needed (dry eyes). OTC lubricating eye drop   pantoprazole 40 MG tablet Commonly known as:  PROTONIX Take 1 tablet (40 mg total) by mouth daily. Take 30-60 min before first meal of the day   pravastatin 40 MG tablet Commonly known as:  PRAVACHOL Take 1 tablet (40 mg total) by mouth at bedtime.   PRESERVISION/LUTEIN Caps Take 1 capsule by mouth 2 (two) times daily.   ranolazine 500 MG 12 hr tablet Commonly known as:  RANEXA Take 1 tablet (500 mg total) by mouth 2 (two) times daily.   torsemide 20 MG tablet Commonly known as:  DEMADEX Take 1 tablet (20 mg total) by mouth daily. Resume Torsemide from 03/11/17

## 2017-04-03 NOTE — Telephone Encounter (Signed)
I am happy to see him tomorrow. However, if he thinks that he is having a heart attack, he should go to the ED.  Nelva Bush, MD [facility] Pager: [phone]

## 2017-04-03 NOTE — Telephone Encounter (Signed)
Spoke with patient's wife who stated that patient woke her at 3 am this morning and said he thinks that he was " having a heart attack", having CP for about an hour...    She gave him an aspirin and called the nurse at [facility].Marland Kitchen He was reluctant to go to the ED because he was feeling better after the aspirin..  The nurse, not sure why she did not call 911, checked vital signs BP 110/63, HR 60 and O2 was good per wife.. We have appt openings tomorrow 3:40 and 4:00 if you want to see him.. Please advise, thank you

## 2017-04-03 NOTE — Telephone Encounter (Signed)
New message    Patient wife called back she thinks her husband had heart attack last night.  [facility] and the nurse checked him this morning and thinks he is ok, what are their options?   ?

## 2017-04-04 ENCOUNTER — Encounter: Payer: Self-pay | Admitting: Internal Medicine

## 2017-04-04 ENCOUNTER — Ambulatory Visit (INDEPENDENT_AMBULATORY_CARE_PROVIDER_SITE_OTHER): Payer: Medicare Other | Admitting: Internal Medicine

## 2017-04-04 VITALS — BP 120/82 | HR 80 | Ht 66.0 in | Wt 183.0 lb

## 2017-04-04 DIAGNOSIS — I25110 Atherosclerotic heart disease of native coronary artery with unstable angina pectoris: Secondary | ICD-10-CM | POA: Diagnosis not present

## 2017-04-04 DIAGNOSIS — I5042 Chronic combined systolic (congestive) and diastolic (congestive) heart failure: Secondary | ICD-10-CM

## 2017-04-04 DIAGNOSIS — E785 Hyperlipidemia, unspecified: Secondary | ICD-10-CM | POA: Diagnosis not present

## 2017-04-04 DIAGNOSIS — Z7189 Other specified counseling: Secondary | ICD-10-CM | POA: Diagnosis not present

## 2017-04-04 MED ORDER — RANOLAZINE ER 500 MG PO TB12: 500.0000 mg | ORAL_TABLET | Freq: Two times a day (BID) | ORAL | 3 refills | Status: AC

## 2017-04-04 MED ORDER — ALBUTEROL SULFATE HFA 108 (90 BASE) MCG/ACT IN AERS: 2.0000 | INHALATION_SPRAY | Freq: Four times a day (QID) | RESPIRATORY_TRACT | 2 refills | Status: AC | PRN

## 2017-04-04 NOTE — Progress Notes (Signed)
Follow-up Outpatient Visit Date: 04/04/2017  Primary Care Provider: [name], MD [place] [address]  Chief Complaint: Chest pain  HPI:  Andrew [name] is a 84 y.o. year-old male with history of CAD status post remote CABG, moderate aortic stenosis, chronic diastolic heart failure, chronic kidney disease, and stroke, who presents for evaluation of chest pain that occurred 2 nights ago. Andrew [name] felt as though he was having another heart attack. He did not want to go to the ED and felt back to normal when he awoke yesterday morning. He was doing relatively from a heart standpoint when I last saw him on 02/22/17, though he was quite fatigued and spent much of his day sleeping. He was also in the process of being worked up for BOOP by Dr. Melvyn Novas.  Today, Andrew [name] reports feeling ok though he has noticed increasing shortness of breath.  This began after his episode of severe chest pain 2 nights ago.  He woke up in the early morning hours with pain across his chest and in both shoulders and arms.  It was reminiscent of what he has felt in the past with his heart attacks.  When his wife awoke and found him to be in distress, she gave him sublingual nitroglycerin.  This did not provide any significant relief.  Andrew [name] did not wish to go to the ED for further assessment.  After 1-2 hours, the pain gradually subsided and has not recurred.  Andrew [name] has been compliant with his medications, though ranolazine was never started out of concerns for potential side effects by his wife in the setting of chronic kidney disease.  Andrew [name] his weight has been stable at home.  He is compliant with DAPT and his diuretic regimen.  He has not had any significant edema or worsening orthopnea.  He also denies palpitations, lightheadedness, and falls.  --------------------------------------------------------------------------------------------------  Cardiovascular History &  Procedures: Cardiovascular Problems:  Coronary artery disease status post CABG in 1991  Moderate aortic stenosis  Ischemic cardiomyopathy  Risk Factors:  Known coronary artery disease, stroke, hyperlipidemia, male gender, and age greater than 50  Cath/PCI:  LHC (2004): One limb of sequential SVG to OM1, OM2, and OM3 occluded.  CV Surgery:  CABG (1991): LIMA to LAD, SVG to PDA, sequential SVG to OM1, OM2, and OM3.  EP Procedures and Devices:  None  Non-Invasive Evaluation(s):  TTE (10/11/16): Normal LV size with moderate LVH. LVEF 40-45% without regional wall motion abnormalities. Grade 2 diastolic dysfunction with elevated filling pressure. Aortic sclerosis with moderate MR. Mildly dilated a sending aorta. Mitral annular calcification and mildly thickened mitral valve leaflets with mild MR. Moderate left atrial enlargement. Normal RV size and function. Severe pulmonary hypertension.  TTE (06/21/16): Mildly dilated left ventricle with mild LVH. LVEF 55-60%. Thickened aortic valve with moderately restricted motion. Mean gradient 22 mmHg. Mild aortic regurgitation and mitral regurgitation. Mild left atrial enlargement. Moderate pulmonic regurgitation. Mild pulmonary hypertension. No significant change from prior echo in 2017.  Carotid artery Doppler (05/28/16): 60-79% left and less than 40% right internal carotid artery stenoses.  Recent CV Pertinent Labs: Lab Results  Component Value Date   CHOL 107 03/07/2017   HDL 43 03/07/2017   LDLCALC 56 03/07/2017   LDLDIRECT 67.0 03/13/2010   TRIG 38 03/07/2017   CHOLHDL 2.5 03/07/2017   INR 1.10 03/07/2017   BNP 478.7 (H) 10/09/2016   K 5.0 03/25/2017   MG 2.1 03/10/2017   BUN  28 (H) 03/25/2017   BUN 22 03/05/2017   CREATININE 1.53 (H) 03/25/2017    Past medical and surgical history were reviewed and updated in EPIC.  Current Meds  Medication Sig  . acetaminophen (TYLENOL) 500 MG tablet Take 500 mg by mouth every  6 (six) hours as needed (pain).  Marland Kitchen aspirin 81 MG EC tablet Take 1 tablet (81 mg total) by mouth daily.  . B Complex-C-Folic Acid (NEPHRO-VITE PO) Take 1 tablet by mouth daily.    . budesonide-formoterol (SYMBICORT) 80-4.5 MCG/ACT inhaler Inhale 2 puffs into the lungs 2 (two) times daily.  . cholecalciferol (VITAMIN D) 1000 units tablet Take 1,000 Units by mouth daily.  . clopidogrel (PLAVIX) 75 MG tablet TAKE ONE TABLET BY MOUTH ONE TIME DAILY   . famotidine (PEPCID) 20 MG tablet One at bedtime  . FLUoxetine (PROZAC) 10 MG tablet Take 1 tablet (10 mg total) by mouth daily.  . isosorbide mononitrate (IMDUR) 120 MG 24 hr tablet Take 1 tablet (120 mg total) by mouth at bedtime.  . metoprolol succinate (TOPROL-XL) 50 MG 24 hr tablet Take 1 tablet (50 mg total) by mouth daily. Take with or immediately following a meal.  . Multiple Vitamins-Minerals (PRESERVISION/LUTEIN) CAPS Take 1 capsule by mouth 2 (two) times daily.  . nitroGLYCERIN (NITROLINGUAL) 0.4 MG/SPRAY spray Place 1 spray under the tongue every 5 (five) minutes x 3 doses as needed for chest pain.  Marland Kitchen OVER THE COUNTER MEDICATION Place 1 drop into both eyes daily as needed (dry eyes). OTC lubricating eye drop  . pantoprazole (PROTONIX) 40 MG tablet Take 1 tablet (40 mg total) by mouth daily. Take 30-60 min before first meal of the day  . torsemide (DEMADEX) 20 MG tablet Take 1 tablet (20 mg total) by mouth daily. Resume Torsemide from 03/11/17  . [DISCONTINUED] pravastatin (PRAVACHOL) 40 MG tablet Take 1 tablet (40 mg total) by mouth at bedtime.    Allergies: Patient has no known allergies.  Social History   Socioeconomic History  . Marital status: Married    Spouse name: Not on file  . Number of children: Not on file  . Years of education: Not on file  . Highest education level: Not on file  Social Needs  . Financial resource strain: Not on file  . Food insecurity - worry: Not on file  . Food insecurity - inability: Not on file  .  Transportation needs - medical: Not on file  . Transportation needs - non-medical: Not on file  Occupational History  . 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 Smoking status: Former Smoker    Packs/day: 2.00    Years: 20.00    Pack years: 40.00    Types: Cigarettes    Last attempt to quit: 01/22/1961    Years since quitting: 56.2  . Smokeless tobacco: Never Used  Substance and Sexual Activity  . Alcohol use: Yes    Alcohol/week: 4.2 oz    Types: 7 Shots of liquor per week    Comment: 1 drink nightly  . Drug use: No  . Sexual activity: No  Other Topics Concern  . Not on file  Social History Narrative  . Not on file    Family History  Problem Relation Age of Onset  . Emphysema Brother   . Lung cancer Brother   . Heart disease Father   . Hypertension Father   . Heart attack  Father   . Colon cancer Sister   . Non-Hodgkin's lymphoma Sister   . Heart disease Brother   . Colon cancer Sister   . Skin cancer Sister   . Alcohol abuse Unknown   . Arthritis Unknown   . Cancer Unknown   . Macular degeneration Unknown   . Lung cancer Daughter     Review of Systems: A 12-system review of systems was performed and was negative except as noted in the HPI.  --------------------------------------------------------------------------------------------------  Physical Exam: BP 120/82   Pulse 80   Ht 5\' 6"  (1.676 m)   Wt 183 lb (83 kg)   BMI 29.54 kg/m   Oxygen saturation with ambulation from wheelchair to standing scale: 85% Oxygen saturation with 5 minutes of rest on room air: 97%  General: Elderly man, seated comfortably in a wheelchair.  He is accompanied by his wife and daughter. HEENT: No conjunctival pallor or scleral icterus. Moist mucous membranes.  OP clear. Neck: Supple without lymphadenopathy, thyromegaly, JVD, or HJR. Lungs: Normal work of breathing. Clear to auscultation bilaterally without wheezes or  crackles. Heart: Regular rate and rhythm with 2/6 systolic murmur loudest at the right upper sternal border.  No rubs or gallops.  Nondisplaced PMI. Abd: Bowel sounds present. Soft, NT/ND without hepatosplenomegaly Ext: No significant lower extremity edema. Skin: Warm and dry without rash.  EKG: Normal sinus rhythm with nonspecific intraventricular conduction delay inferolateral T wave inversions and nonspecific ST changes.  IVCD has replaced left bundle branch block.  ST/T changes are also more pronounced than on previous tracings.  Lab Results  Component Value Date   WBC 7.7 03/25/2017   HGB 14.5 03/25/2017   HCT 43.4 03/25/2017   MCV 105.2 (H) 03/25/2017   PLT 102.0 (L) 03/25/2017    Lab Results  Component Value Date   NA 140 03/25/2017   K 5.0 03/25/2017   CL 107 03/25/2017   CO2 28 03/25/2017   BUN 28 (H) 03/25/2017   CREATININE 1.53 (H) 03/25/2017   GLUCOSE 97 03/25/2017   ALT 63 03/07/2017    Lab Results  Component Value Date   CHOL 107 03/07/2017   HDL 43 03/07/2017   LDLCALC 56 03/07/2017   LDLDIRECT 67.0 03/13/2010   TRIG 38 03/07/2017   CHOLHDL 2.5 03/07/2017    --------------------------------------------------------------------------------------------------  ASSESSMENT AND PLAN: Coronary artery disease with unstable angina Recurrent chest pain 2 nights ago is concerning for unstable angina and potentially even MI.  I have recommended that Mr. Galas be taken to the emergency department for further evaluation and treatment.  However, he does not wish to seek inpatient care and declines referral to the ED.  In light of his advanced age and other comorbidities, I certainly think that a palliative care approach to his symptoms is reasonable.  We will continue his current antianginal regimen and we have discussed the risks and benefits of ranolazine and have agreed to initiate 500 mg twice daily as well, as was prescribed at his most recent hospital visit.  For the  time being, we will continue with dual antiplatelet therapy.  However, if Andrew [name] transitions to hospice, I think it would be reasonable to discontinue these medications.  Chronic systolic and diastolic heart failure Mr. Kerschner reports worsening shortness of breath though he appears euvolemic on exam.  His weight is also stable compared with earlier this month and only slightly up from late February.  I do not think that further escalation of diuresis at this point will  markedly improve his breathing.  Underlying lung disease and recent acute coronary syndrome are likely contributing to his dyspnea.  We will continue his current dose of torsemide.  Hyperlipidemia Given transition to palliative care, I feel that it is no longer beneficial to continue with aggressive lipid therapy.  We will discontinue pravastatin.  Goals of care Andrew [name], his wife and daughter, and I spoke at length regarding his goals of care moving forward.  He and his family are all in agreement that hospitalizations should be avoided.  He realizes that his medical conditions are very serious and that our treatment options are quite limited.  He does not wish to undergo any invasive procedures including cardiac catheterization, mechanical ventilation, or cardiac resuscitation.  He already has a DO NOT RESUSCITATE order in place.  Given his hypoxia with minimal activity today, I certainly think that Andrew [name] would benefit from supplemental oxygen.  We will try to make arrangements for this.  We will also refer Andrew [name] to palliative care to discuss symptom management options and consideration of enrollment in hospice.  Given his severe coronary disease and systolic heart failure, his life expectancy is likely less than 6 months.  Follow-up: Return to clinic as previously scheduled in May.  However, if Andrew [name] transitions to hospice care in the meantime, he can follow-up as needed with [place].  60 minutes were spent on this encounter, of  which more than 50% was spent face-to-face with the patient and his family counseling and discussing goals of care.  Nelva Bush, MD 04/05/2017 9:22 AM

## 2017-04-04 NOTE — Patient Instructions (Addendum)
Medication Instructions:  Ranexa 500 mg twice per day  Ventolin 2 puffs every 6 hours for Shortness of breath  Stop Pravastatin   -- If you need a refill on your cardiac medications before your next appointment, please call your pharmacy. --  Labwork: None ordered  Testing/Procedures: None ordered  Follow-Up: Keep appt  5/6  Thank you for choosing [facility]!!    Any Other Special Instructions Will Be Listed Below (If Applicable).  Referrals:  Palliative Care enroll check for [place] for supplemental Oxygen

## 2017-04-05 ENCOUNTER — Other Ambulatory Visit: Payer: Self-pay | Admitting: *Deleted

## 2017-04-05 ENCOUNTER — Telehealth: Payer: Self-pay | Admitting: *Deleted

## 2017-04-05 ENCOUNTER — Telehealth: Payer: Self-pay | Admitting: Family Medicine

## 2017-04-05 ENCOUNTER — Encounter: Payer: Self-pay | Admitting: Internal Medicine

## 2017-04-05 ENCOUNTER — Other Ambulatory Visit: Payer: Self-pay | Admitting: Family Medicine

## 2017-04-05 DIAGNOSIS — I5022 Chronic systolic (congestive) heart failure: Secondary | ICD-10-CM

## 2017-04-05 DIAGNOSIS — N183 Chronic kidney disease, stage 3 unspecified: Secondary | ICD-10-CM

## 2017-04-05 DIAGNOSIS — I209 Angina pectoris, unspecified: Secondary | ICD-10-CM

## 2017-04-05 DIAGNOSIS — Z7189 Other specified counseling: Secondary | ICD-10-CM

## 2017-04-05 DIAGNOSIS — I214 Non-ST elevation (NSTEMI) myocardial infarction: Secondary | ICD-10-CM

## 2017-04-05 DIAGNOSIS — R0602 Shortness of breath: Secondary | ICD-10-CM

## 2017-04-05 DIAGNOSIS — R0902 Hypoxemia: Secondary | ICD-10-CM

## 2017-04-05 DIAGNOSIS — Z951 Presence of aortocoronary bypass graft: Secondary | ICD-10-CM

## 2017-04-05 DIAGNOSIS — I639 Cerebral infarction, unspecified: Secondary | ICD-10-CM

## 2017-04-05 DIAGNOSIS — F0150 Vascular dementia without behavioral disturbance: Secondary | ICD-10-CM

## 2017-04-05 NOTE — Telephone Encounter (Signed)
Copied from [place] [phone]. Topic: Quick Communication - See Telephone Encounter >> Apr 05, 2017 11:07 AM Hewitt Shorts wrote: CRM for notification. See Telephone encounter for: Dr. Gerald Stabs End called to let Dr. [name] know that he saw the patient and feels that he had a heart attach 2 days ago and he feels that with this condition and other health issues he needs to be offered hospice and pallative care  please call the cardiologist at [phone]  04/05/17.

## 2017-04-05 NOTE — Telephone Encounter (Signed)
I spoke to Dr. Saunders Revel on the phone. With everything that has happened over the last 12-18 months, several recent MI's, palliative care consultation I think is appropriate step.   I will initiate.   Electronically Signed  By: [name], MD On: 04/05/2017 11:40 AM

## 2017-04-05 NOTE — Telephone Encounter (Signed)
I will also send to [place].  Consultation of palliative care, initially started by cardiology, but I will be the referring physician for this case. Orders placed.   Feel free to call if questions arise.   Electronically Signed  By: [name], MD On: 04/05/2017 11:47 AM

## 2017-04-05 NOTE — Telephone Encounter (Signed)
[facility]

## 2017-04-05 NOTE — Telephone Encounter (Signed)
Called [facility], spoke with [name]. Faxed Referral to her directly. Called [name], told her she will be hearing from [facility] sometime next week.

## 2017-04-05 NOTE — Progress Notes (Signed)
I spoke to Dr. Saunders Revel, and I am happy to be the managing physician for palliative care for my long-term patient.

## 2017-04-06 ENCOUNTER — Other Ambulatory Visit: Payer: Self-pay | Admitting: Family Medicine

## 2017-04-08 ENCOUNTER — Other Ambulatory Visit: Payer: Self-pay | Admitting: Internal Medicine

## 2017-04-08 DIAGNOSIS — I5032 Chronic diastolic (congestive) heart failure: Secondary | ICD-10-CM

## 2017-04-08 DIAGNOSIS — I350 Nonrheumatic aortic (valve) stenosis: Secondary | ICD-10-CM

## 2017-04-08 NOTE — Telephone Encounter (Signed)
Refill Request.  

## 2017-04-09 ENCOUNTER — Telehealth: Payer: Self-pay | Admitting: Family Medicine

## 2017-04-09 ENCOUNTER — Encounter (HOSPITAL_COMMUNITY): Payer: Self-pay | Admitting: Emergency Medicine

## 2017-04-09 ENCOUNTER — Emergency Department (HOSPITAL_COMMUNITY): Payer: Medicare Other

## 2017-04-09 ENCOUNTER — Telehealth: Payer: Self-pay | Admitting: Physician Assistant

## 2017-04-09 ENCOUNTER — Emergency Department (HOSPITAL_COMMUNITY)
Admission: EM | Admit: 2017-04-09 | Discharge: 2017-04-09 | Disposition: A | Discharge status: Home / Self Care | Payer: Medicare Other | Attending: Emergency Medicine | Admitting: Emergency Medicine

## 2017-04-09 ENCOUNTER — Other Ambulatory Visit: Payer: Self-pay

## 2017-04-09 ENCOUNTER — Encounter: Payer: Self-pay | Admitting: Internal Medicine

## 2017-04-09 ENCOUNTER — Other Ambulatory Visit: Payer: Self-pay | Admitting: Internal Medicine

## 2017-04-09 DIAGNOSIS — R0602 Shortness of breath: Secondary | ICD-10-CM

## 2017-04-09 DIAGNOSIS — Z79899 Other long term (current) drug therapy: Secondary | ICD-10-CM | POA: Diagnosis not present

## 2017-04-09 DIAGNOSIS — R079 Chest pain, unspecified: Secondary | ICD-10-CM | POA: Diagnosis present

## 2017-04-09 DIAGNOSIS — R7989 Other specified abnormal findings of blood chemistry: Secondary | ICD-10-CM

## 2017-04-09 DIAGNOSIS — I10 Essential (primary) hypertension: Secondary | ICD-10-CM | POA: Insufficient documentation

## 2017-04-09 DIAGNOSIS — Z8673 Personal history of transient ischemic attack (TIA), and cerebral infarction without residual deficits: Secondary | ICD-10-CM | POA: Insufficient documentation

## 2017-04-09 DIAGNOSIS — R0902 Hypoxemia: Secondary | ICD-10-CM

## 2017-04-09 DIAGNOSIS — Z951 Presence of aortocoronary bypass graft: Secondary | ICD-10-CM | POA: Diagnosis not present

## 2017-04-09 DIAGNOSIS — J449 Chronic obstructive pulmonary disease, unspecified: Secondary | ICD-10-CM | POA: Diagnosis not present

## 2017-04-09 DIAGNOSIS — R778 Other specified abnormalities of plasma proteins: Secondary | ICD-10-CM

## 2017-04-09 DIAGNOSIS — Z87891 Personal history of nicotine dependence: Secondary | ICD-10-CM | POA: Diagnosis not present

## 2017-04-09 DIAGNOSIS — Z85828 Personal history of other malignant neoplasm of skin: Secondary | ICD-10-CM | POA: Diagnosis not present

## 2017-04-09 DIAGNOSIS — I2510 Atherosclerotic heart disease of native coronary artery without angina pectoris: Secondary | ICD-10-CM | POA: Diagnosis not present

## 2017-04-09 LAB — CBC WITH DIFFERENTIAL/PLATELET
Basophils Absolute: 0 10*3/uL (ref 0.0–0.1)
Basophils Relative: 0 %
Eosinophils Absolute: 0.1 10*3/uL (ref 0.0–0.7)
Eosinophils Relative: 1 %
HCT: 49.3 % (ref 39.0–52.0)
Hemoglobin: 16.2 g/dL (ref 13.0–17.0)
LYMPHS ABS: 1.3 10*3/uL (ref 0.7–4.0)
LYMPHS PCT: 12 %
MCH: 35.2 pg — AB (ref 26.0–34.0)
MCHC: 32.9 g/dL (ref 30.0–36.0)
MCV: 107.2 fL — AB (ref 78.0–100.0)
Monocytes Absolute: 0.8 10*3/uL (ref 0.1–1.0)
Monocytes Relative: 7 %
NEUTROS PCT: 80 %
Neutro Abs: 8.5 10*3/uL — ABNORMAL HIGH (ref 1.7–7.7)
Platelets: 121 10*3/uL — ABNORMAL LOW (ref 150–400)
RBC: 4.6 MIL/uL (ref 4.22–5.81)
RDW: 16.9 % — ABNORMAL HIGH (ref 11.5–15.5)
WBC: 10.7 10*3/uL — AB (ref 4.0–10.5)

## 2017-04-09 LAB — BASIC METABOLIC PANEL
ANION GAP: 11 (ref 5–15)
BUN: 33 mg/dL — ABNORMAL HIGH (ref 6–20)
CALCIUM: 9.3 mg/dL (ref 8.9–10.3)
CHLORIDE: 104 mmol/L (ref 101–111)
CO2: 22 mmol/L (ref 22–32)
Creatinine, Ser: 1.54 mg/dL — ABNORMAL HIGH (ref 0.61–1.24)
GFR calc non Af Amer: 37 mL/min — ABNORMAL LOW (ref >60)
GFR, EST AFRICAN AMERICAN: 43 mL/min — AB (ref >60)
Glucose, Bld: 126 mg/dL — ABNORMAL HIGH (ref 65–99)
POTASSIUM: 5.2 mmol/L — AB (ref 3.5–5.1)
Sodium: 137 mmol/L (ref 135–145)

## 2017-04-09 LAB — I-STAT TROPONIN, ED: TROPONIN I, POC: 1.46 ng/mL — AB (ref 0.00–0.08)

## 2017-04-09 LAB — BRAIN NATRIURETIC PEPTIDE: B Natriuretic Peptide: 1510.8 pg/mL — ABNORMAL HIGH (ref 0.0–100.0)

## 2017-04-09 MED ORDER — ALBUTEROL SULFATE (2.5 MG/3ML) 0.083% IN NEBU: 5.0000 mg | INHALATION_SOLUTION | Freq: Once | RESPIRATORY_TRACT | Status: DC

## 2017-04-09 MED ORDER — ASPIRIN 81 MG PO CHEW
324.0000 mg | CHEWABLE_TABLET | Freq: Once | ORAL | Status: AC
  Administered 2017-04-09: 324 mg via ORAL
  Filled 2017-04-09: qty 4

## 2017-04-09 MED ORDER — FUROSEMIDE 10 MG/ML IJ SOLN
60.0000 mg | Freq: Once | INTRAMUSCULAR | Status: AC
  Administered 2017-04-09: 60 mg via INTRAVENOUS
  Filled 2017-04-09: qty 6

## 2017-04-09 NOTE — Discharge Planning (Signed)
Pt will discharge to [facility] to be evaluated for oxygen requirements.  Pt and spouse agreeable to discharge plan.

## 2017-04-09 NOTE — ED Notes (Signed)
ED Provider at bedside. 

## 2017-04-09 NOTE — Telephone Encounter (Signed)
Pt daughter called back. unfortunately pt is being transported via ambulance to SNF. No need to return call for oxygen.

## 2017-04-09 NOTE — Discharge Instructions (Signed)
Patient requiring supplemental O2, and will require SNF bed.

## 2017-04-09 NOTE — Discharge Planning (Signed)
Pt currently active with [facility] for palliative services.  Resumption of care requested. Bevely Palmer of [place] notified.  DME needs identified at this time include O2; [facility] contacted.

## 2017-04-09 NOTE — Telephone Encounter (Signed)
Richardson Dopp, [facility] s/w [facility] this morning.

## 2017-04-09 NOTE — ED Notes (Signed)
Patient transported to X-ray 

## 2017-04-09 NOTE — ED Triage Notes (Signed)
Pt to ED via [facility] from [facility]. Pt supposed to be on O2 24/7 but has not gotten O2 delivered to him yet so has been SOB since yesterday morning. Fire put pt on NRB, transitioned to [place], arrived on 4 L and denies CP since SOB has resolved. NAD.

## 2017-04-09 NOTE — Discharge Planning (Signed)
EDCM spoke with pt spouse Rod Holler) at bedside regarding home oxygen delivery as pt was sleeping. Rod Holler states O2 was ordered after visit at Dr Darnelle Bos office 3/15 but she did not remember which company.  EDCM reviewed chart to find order was faxed 3/15 to [place].  [facility] contacted hospital liaison to assist with follow up on oxygen delivery. Will update staff when information becomes available.

## 2017-04-09 NOTE — Telephone Encounter (Signed)
I have already been involved with this earlier today via mychart and phone. I will speak to Rod Holler to check on him.

## 2017-04-09 NOTE — ED Provider Notes (Signed)
I received the patient in signout from Dr. Dina Rich, briefly patient is a 84 year old male with likely a N STEMI the family is requesting for an option to treat as end-of-life care as opposed to aggressive treatment.  Social work is looking for placement availabilities.  There was a availability at the patient's own facility in a more nursing intensive area.  Will discharge to there.   Deno Etienne, DO 04/09/17 1357

## 2017-04-09 NOTE — ED Notes (Signed)
Pt given a [place] sandwich meal, applesauce, crackers, peanut butter, and apple juice, per Jenny Reichmann, RN.

## 2017-04-09 NOTE — ED Provider Notes (Signed)
[facility] EMERGENCY DEPARTMENT Provider Note   CSN: [id number] Arrival date & time: 04/09/17  0547     History   Chief Complaint Chief Complaint  Patient presents with  . Chest Pain  . Shortness of Breath    HPI Andrew [name] is a 84 y.o. male.  HPI  This is a 84 year old male with multiple medical problems including coronary artery disease, COPD, recent admission for elevated troponins and NSTEMI who presents with shortness of breath.  Initially patient could not provide much history.  He states "my wife has all the answers."  He is oriented x2 but had difficulty with place.  Patient has no complaints at this time.  Per EMS report, he is O2 sats were in the 80s upon arrival.  Upon placing supplemental oxygen, patient's O2 sats improved and his symptoms resolved.  Per report, he is supposed to be on supplemental oxygen.  7:26 AM Patient's wife is now at the bedside.  She confirms that he appears more short of breath throughout the night and appeared uncomfortable.  He has been transitioned to palliative care and is DNR.  She reports that oxygen has been ordered but has arrived.  He has a known history of congestive heart failure and coronary artery disease but no interventions are available.  Both his primary physician and cardiologist have been involved in these care discussions.  Wife is interested in keeping him comfortable.  I have reviewed the chart.  Discussions with Dr. Saunders Revel and Dr. Edilia Bo reflect transition of patient to palliative care and goals of care without any aggressive interventions.  Goal for avoiding hospitalization.  Past Medical History:  Diagnosis Date  . Allergic rhinitis   . AORTIC STENOSIS   . BPV (benign positional vertigo)   . CAD, ARTERY BYPASS GRAFT   . CAROTID ARTERY STENOSIS 03/07/2010   80%  . CHF (congestive heart failure) ([place])   . COPD, mild ([place]) 12/22/2010  . GASTROESOPHAGEAL REFLUX DISEASE   . History of colonic polyps      1999, 2004  . History of prostate cancer 2002   s/p treatment with seeds / radiation  . HYPERCHOLESTEROLEMIA   . Legally blind   . Loss of hearing    left,  . Lumbar spinal stenosis 07/13/2009  . Macular degeneration (senile) of retina 04/29/2014  . Pancreatic cyst 01/29/2013   Noted on MRI scan from [facility] on 07/27/2011.  I reviewed report at patient request on 01-29-2013.  "Small cystic focus in the posterior pancreatic head.  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  Follow up MRI is advised. The main pancreatic duct remains normal in appearance."  Patient do  . S/P excision of acoustic neuroma   . Skin cancer   . Stroke ([place])   . Thyroid nodule 05/2010   Abnormal biopsy, 84 year old patient and his wife have made informed decision to not pursue surgical resection. Potential risk including cancer has been thoroughly discussed with the patient.    Patient Active Problem List   Diagnosis Date Noted  . New onset left bundle branch block (LBBB) 03/07/2017  . ACS (acute coronary syndrome) ([place]) 03/07/2017  . Coronary artery disease of native heart with stable angina pectoris ([place]) 02/23/2017  . BOOP (bronchiolitis obliterans with organizing pneumonia) ([place]) 02/23/2017  . Hyperkalemia 02/14/2017  . Pulmonary infiltrates on CXR 02/13/2017  . Dyspnea on exertion 02/13/2017  .  Cough variant asthma vs UACS 02/13/2017  . Vascular dementia without behavioral disturbance 12/25/2016  . Chronic combined systolic and diastolic heart failure ([place])   . NSTEMI (non-ST elevated myocardial infarction) ([place]) 10/09/2016  . CAD (coronary artery disease) 06/28/2016  . Bilateral carotid artery stenosis 06/28/2016  . Hyperlipidemia LDL goal <70 06/28/2016  . Advanced Planning: DNR 04/09/2016  . Essential hypertension   . Alcohol abuse   . Thrombocytopenia ([place])   . Right-sided cerebrovascular  accident (CVA) ([place])   . Chronic renal insufficiency, stage III (moderate) ([place]) 06/02/2014  . Macular degeneration (senile) of retina 04/29/2014  . Major depressive disorder, single episode 06/11/2013  . TIA (transient ischemic attack) 06/03/2013  . Pancreatic cyst 01/29/2013  . OSA (obstructive sleep apnea) 12/26/2012  . Tachycardia-bradycardia syndrome ([place]) 08/19/2012  . COPD, minimal-mild 12/22/2010  . S/P excision of acoustic neuroma 06/18/2010  . Pulmonary nodule 06/17/2010  . Vertigo 06/14/2010  . THYROID NODULE 03/13/2010  . PVD- moderate carotid disease 03/07/2010  . Spinal stenosis, lumbar region, with neurogenic claudication 07/13/2009  . ALLERGIC RHINITIS 09/01/2008  . COLONIC POLYPS, HX OF 09/01/2008  . HYPERCHOLESTEROLEMIA 06/18/2008  . Angina, class II ([place]) 06/18/2008  . Hx of CABG 06/18/2008  . Nonrheumatic aortic valve stenosis 06/18/2008  . GASTROESOPHAGEAL REFLUX DISEASE 06/18/2008    Past Surgical History:  Procedure Laterality Date  . CATARACT EXTRACTION    . CATARACT EXTRACTION  2010  . CHOLECYSTECTOMY    . CORONARY ARTERY BYPASS GRAFT    . CRANIECTOMY FOR EXCISION OF ACOUSTIC NEUROMA  1985  . EYE SURGERY    . SHOULDER SURGERY    . TONSILLECTOMY AND ADENOIDECTOMY  1932  . TOTAL KNEE ARTHROPLASTY    . transperineal implatation of palladium         Home Medications    Prior to Admission medications   Medication Sig Start Date End Date Taking? Authorizing Provider  acetaminophen (TYLENOL) 500 MG tablet Take 500 mg by mouth every 6 (six) hours as needed (pain).   Yes [provider]  albuterol (PROVENTIL HFA;VENTOLIN HFA) 108 (90 Base) MCG/ACT inhaler Inhale 2 puffs into the lungs every 6 (six) hours as needed for wheezing or shortness of breath. 04/04/17  Yes End, Harrell Gave, MD  B Complex-C-Folic Acid (NEPHRO-VITE PO) Take 1 tablet by mouth daily.     Yes [provider]  budesonide-formoterol (SYMBICORT) 80-4.5 MCG/ACT inhaler  Inhale 2 puffs into the lungs 2 (two) times daily. 02/13/17  Yes Tanda Rockers, MD  cholecalciferol (VITAMIN D) 1000 units tablet Take 1,000 Units by mouth daily.   Yes [provider]  clopidogrel (PLAVIX) 75 MG tablet TAKE ONE TABLET BY MOUTH ONE TIME DAILY  01/04/17  Yes End, Harrell Gave, MD  famotidine (PEPCID) 20 MG tablet One at bedtime 02/13/17  Yes Tanda Rockers, MD  FLUoxetine (PROZAC) 10 MG tablet TAKE 1 TABLET DAILY 04/08/17  Yes Copland, Frederico Hamman, MD  isosorbide mononitrate (IMDUR) 120 MG 24 hr tablet Take 1 tablet (120 mg total) by mouth at bedtime. 10/22/16  Yes Weaver, Scott T, PA-C  metoprolol succinate (TOPROL-XL) 50 MG 24 hr tablet Take 1 tablet (50 mg total) by mouth daily. Take with or immediately following a meal. 11/15/16  Yes End, Harrell Gave, MD  Multiple Vitamins-Minerals (PRESERVISION/LUTEIN) CAPS Take 1 capsule by mouth 2 (two) times daily.   Yes [provider]  nitroGLYCERIN (NITROLINGUAL) 0.4 MG/SPRAY spray Place 1 spray under the tongue every 5 (five) minutes x 3 doses as needed for  chest pain. 12/19/16  Yes End, Harrell Gave, MD  OVER THE COUNTER MEDICATION Place 1 drop into both eyes daily as needed (dry eyes). OTC lubricating eye drop   Yes [provider]  pantoprazole (PROTONIX) 40 MG tablet Take 1 tablet (40 mg total) by mouth daily. Take 30-60 min before first meal of the day 02/13/17  Yes Tanda Rockers, MD  ranolazine (RANEXA) 500 MG 12 hr tablet Take 1 tablet (500 mg total) by mouth 2 (two) times daily. 04/04/17  Yes End, Harrell Gave, MD  torsemide (DEMADEX) 20 MG tablet Take 1 tablet (20 mg total) by mouth daily. Resume Torsemide from 03/11/17 03/11/17  Yes Aline August, MD    Family History Family History  Problem Relation Age of Onset  . Emphysema Brother   . Lung cancer Brother   . Heart disease Father   . Hypertension Father   . Heart attack Father   . Colon cancer Sister   . Non-Hodgkin's lymphoma Sister   . Heart disease  Brother   . Colon cancer Sister   . Skin cancer Sister   . Alcohol abuse Unknown   . Arthritis Unknown   . Cancer Unknown   . Macular degeneration Unknown   . Lung cancer Daughter     Social History Social History   Tobacco Use  . Smoking status: Former Smoker    Packs/day: 2.00    Years: 20.00    Pack years: 40.00    Types: Cigarettes    Last attempt to quit: 01/22/1961    Years since quitting: 56.2  . Smokeless tobacco: Never Used  Substance Use Topics  . Alcohol use: Yes    Alcohol/week: 4.2 oz    Types: 7 Shots of liquor per week    Comment: 1 drink nightly  . Drug use: No     Allergies   Patient has no known allergies.   Review of Systems Review of Systems  Constitutional: Negative for fever.  Respiratory: Positive for shortness of breath. Negative for cough.   Cardiovascular: Positive for chest pain.  Gastrointestinal: Negative for abdominal pain and nausea.  Genitourinary: Negative for dysuria.  Musculoskeletal: Negative for back pain.  Skin: Negative for rash.  Neurological: Negative for headaches.  All other systems reviewed and are negative.    Physical Exam Updated Vital Signs BP 123/76   Pulse 81   Resp (!) 26   Ht 5\' 6"  (1.676 m)   Wt 79.4 kg (175 lb)   SpO2 100%   BMI 28.25 kg/m   Physical Exam  Constitutional:  Elderly, nontoxic-appearing, no acute distress, nasal cannula in place  HENT:  Head: Normocephalic and atraumatic.  Eyes: Pupils are equal, round, and reactive to light.  Cardiovascular: Normal rate, regular rhythm and normal heart sounds.  Well-healed midline sternotomy scar  Pulmonary/Chest: Effort normal and breath sounds normal. No respiratory distress. He has no wheezes.  Abdominal: Soft. Bowel sounds are normal. There is no tenderness. There is no rebound.  Musculoskeletal: He exhibits no edema.  Trace bilateral lower extremity edema  Lymphadenopathy:    He has no cervical adenopathy.  Neurological: He is alert.    Oriented x2  Skin: Skin is warm and dry.  Psychiatric: He has a normal mood and affect.  Nursing note and vitals reviewed.    ED Treatments / Results  Labs (all labs ordered are listed, but only abnormal results are displayed) Labs Reviewed  CBC WITH DIFFERENTIAL/PLATELET - Abnormal; Notable for the following components:  Result Value   WBC 10.7 (*)    MCV 107.2 (*)    MCH 35.2 (*)    RDW 16.9 (*)    Platelets 121 (*)    Neutro Abs 8.5 (*)    All other components within normal limits  BASIC METABOLIC PANEL - Abnormal; Notable for the following components:   Potassium 5.2 (*)    Glucose, Bld 126 (*)    BUN 33 (*)    Creatinine, Ser 1.54 (*)    GFR calc non Af Amer 37 (*)    GFR calc Af Amer 43 (*)    All other components within normal limits  I-STAT TROPONIN, ED - Abnormal; Notable for the following components:   Troponin i, poc 1.46 (*)    All other components within normal limits  BRAIN NATRIURETIC PEPTIDE    EKG  EKG Interpretation  Date/Time:  Tuesday April 09 2017 05:57:31 EDT Ventricular Rate:  103 PR Interval:    QRS Duration: 128 QT Interval:  349 QTC Calculation: 457 R Axis:   -53 Text Interpretation:  Normal sinus rhythm Left bundle branch block Premature ventricular complexes Baseline wander in lead(s) V1 Confirmed by Thayer Jew [id number]) on 04/09/2017 6:03:54 AM Also confirmed by Thayer Jew [phone]), editor Hattie Perch [phone])  on 04/09/2017 6:49:20 AM       Radiology Dg Chest 2 View  Result Date: 04/09/2017 CLINICAL DATA:  Shortness of breath.  Chest pain. EXAM: CHEST - 2 VIEW COMPARISON:  03/13/2017 FINDINGS: Lower lung volumes from prior exam leading to bronchovascular crowding. Post median sternotomy with unchanged cardiomegaly. Progressive interstitial opacities from prior exam, suspicious for pulmonary edema. Chronic right perihilar opacity with slight increase likely due to superimposed pulmonary edema. Small pleural  effusions. IMPRESSION: 1. Pulmonary edema and small pleural effusions, suspicious for fluid overload/CHF. 2. Cardiomegaly is grossly unchanged. Electronically Signed   By: Jeb Levering M.D.   On: 04/09/2017 06:44    Procedures Procedures (including critical care time)  Medications Ordered in ED Medications  albuterol (PROVENTIL) (2.5 MG/3ML) 0.083% nebulizer solution 5 mg (5 mg Nebulization Not Given 04/09/17 0645)  aspirin chewable tablet 324 mg (324 mg Oral Given 04/09/17 0651)     Initial Impression / Assessment and Plan / ED Course  I have reviewed the triage vital signs and the nursing notes.  Pertinent labs & imaging results that were available during my care of the patient were reviewed by me and considered in my medical decision making (see chart for details).     She presents with shortness of breath and chest pain prior to arrival.  Completely resolved with O2 supplementation.  He is nontoxic appearing and vital signs are reassuring.  No acute ischemia on EKG.  Troponin is elevated.  It is similar to his prior elevated troponin in February.  I have reviewed his cardiology notes.  No recommended interventions.  Goals of care are palliative at this point.  I have confirmed with the wife.  She is very interested however in getting his supplemental oxygen so he can be comfortable.  Will avoid further workup and hospitalization.  Not appear grossly volume overloaded; however, chest x-ray is concerning for mild fluid overload.  Will give 1 dose of diuretic.  Will consult case management to help with oxygen.  Final Clinical Impressions(s) / ED Diagnoses   Final diagnoses:  Shortness of breath  Hypoxia  Elevated troponin    ED Discharge Orders    None  Merryl Hacker, MD 04/09/17 [phone]

## 2017-04-09 NOTE — Telephone Encounter (Signed)
New Message:    Andrew [name] from [facility] is on the line stating he received two orders that were wrong for the pt. He needs clarification

## 2017-04-09 NOTE — Telephone Encounter (Signed)
Copied from [place]. Topic: Quick Communication - See Telephone Encounter >> Apr 09, 2017  1:52 PM Lolita Rieger, RMA wrote: CRM for notification. See Telephone encounter for:   04/09/17.pt daughter would like a call back concerning oxygen for pt please call Baxter Flattery [phone] 

## 2017-04-09 NOTE — ED Notes (Signed)
PTAR called for transport to [facility].  

## 2017-04-09 NOTE — Progress Notes (Signed)
[facility] Liaison Note:  Notified by Fuller Mandril, CMRN that patient is in ED and currently managed by [facility] services.  [facility] Palliative team notified.   DME is not ordered through [facility] for Palliative Care patients. DME will need to be arranged through patient's PCP or attending. CMRN notified via voicemail.  Please call with any hospice questions or concerns.  Thank you,  Farrel Gordon, RN, [facility] Liaison [phone]  [facility] hospital liaisons are on [place].

## 2017-04-10 ENCOUNTER — Telehealth: Payer: Self-pay | Admitting: Family Medicine

## 2017-04-10 ENCOUNTER — Telehealth: Payer: Self-pay | Admitting: Internal Medicine

## 2017-04-10 NOTE — Telephone Encounter (Signed)
This is a Solicitor patient of Dr. Darnelle Bos. Richardson Dopp, [place] has also been involved with the patient's care. Per 04/05/17 phone note, an O2 order was faxed by Altha Harm, LPN to [facility] [place]. However, Dr. Edilia Bo initiated a referral to palliative care for the patient on 04/05/17. Will forward to the [place] office to review.

## 2017-04-10 NOTE — Telephone Encounter (Signed)
[place] s/w [facility] yesterday. Per Richardson Dopp, PA he advised [facility] this needs to be address with Dr. Saunders Revel himsel. I believe the order was not put in correctly, though per Brynda Rim. PA this needs to be address by Dr. Saunders Revel please. I thank you for your help in this matter.

## 2017-04-10 NOTE — Telephone Encounter (Signed)
I have called all Available number, general number without answers.  Rodena Piety, NP, from hospice was helpful.

## 2017-04-10 NOTE — Telephone Encounter (Signed)
I have called several more times, no answer at [facility] area.  I called Dr. Bubba Camp on her cell phone and left a message with no answer.

## 2017-04-10 NOTE — Telephone Encounter (Signed)
I spoke to [place], LPN at [facility].   I gave her verbal orders for   Roxanol suspension 5 mg, PO every 4 hours as needed for chest pain or pain  Ativan 0.5 mg, 1 tab po q 6 hours prn anxiety.  Electronically Signed  By: [name], MD On: 04/10/2017 5:36 PM

## 2017-04-10 NOTE — Telephone Encounter (Signed)
Spoke with Tinika from [facility] in regards to patient's home O2..   I informed her that this issue be deferred to the patient's PCP (Dr. [name]) and Pulmonologist (Dr. Melvyn Novas)..  She verbalized understanding, although I informed her that if she needed any assistance be sure to contact us.Marland Kitchen

## 2017-04-10 NOTE — Telephone Encounter (Signed)
Copied from [place] [id number]. Topic: General - Other >> Apr 10, 2017  2:50 PM Cecelia Byars, NT wrote: Reason for CRM: [facility] called and would like orders for Roxanol 5 mg Q 4 hrs  for chest pain or as  needed ,and something for anxiety he has had 3 heart attacks the last one  was last week  please call thE facility  at,  [phone] or , Archie Endo  NP [phone]

## 2017-04-10 NOTE — Telephone Encounter (Signed)
Called and spoke with [place] with [facility].  Ludger Nutting is requesting an order for O2 to be placed.  I have made her aware that per 02/13/17 OV pt did not qualify for O2.  Marcie Bal states pt had ED visit yesterday and was instructed to wear O2 cont.  I made Tankia aware that pt would need to come in to be qualified, as we can not place an order based off of d/c summary because required sats were not noted.  Pt has upcoming apt 04/15/17. I have updated apt note to make nurse aware that qualifying walk is needed.  Nothing further is needed.

## 2017-04-10 NOTE — Telephone Encounter (Signed)
[place] patient- forwarding to Dr. Darnelle Bos nurse in [place]- Dr. Saunders Revel is not in the [place] office today- he is covering the hospital.

## 2017-04-10 NOTE — Telephone Encounter (Signed)
I have also called numbers for Harshith Batzel and Feliberto Stockley, but there is no answer and no voicemail set up.

## 2017-04-10 NOTE — Telephone Encounter (Signed)
Need order for continuous oxygen.

## 2017-04-10 NOTE — Telephone Encounter (Signed)
Copied from [place] [id number]. Topic: Quick Communication - Rx Refill/Question >> Apr 10, 2017  5:56 PM Waylan Rocher, [place] L wrote: Medication: Roxanol 5mg  suspension every 4 hours as needed for pain or chest pain + atavan 0.5mg  tabs every 6 hrs as needed fro anxiety  Has the patient contacted their pharmacy? No. (new script req)  (Agent: If no, request that the patient contact the pharmacy for the refill.)  Preferred Pharmacy (with phone number or street name): [facility] [id number], [place] - [place] [address] [place] [address] Phone: [phone] Fax: [phone]  Agent: Please be advised that RX refills may take up to 3 business days. We ask that you follow-up with your pharmacy.  If anyone calls, please ask for the "[place] nurse".

## 2017-04-11 ENCOUNTER — Telehealth: Payer: Self-pay | Admitting: *Deleted

## 2017-04-11 ENCOUNTER — Non-Acute Institutional Stay (SKILLED_NURSING_FACILITY): Payer: Medicare Other | Admitting: Internal Medicine

## 2017-04-11 ENCOUNTER — Telehealth: Payer: Self-pay

## 2017-04-11 ENCOUNTER — Encounter: Payer: Self-pay | Admitting: Internal Medicine

## 2017-04-11 DIAGNOSIS — W19XXXA Unspecified fall, initial encounter: Secondary | ICD-10-CM | POA: Diagnosis not present

## 2017-04-11 DIAGNOSIS — Z7189 Other specified counseling: Secondary | ICD-10-CM

## 2017-04-11 DIAGNOSIS — J8489 Other specified interstitial pulmonary diseases: Secondary | ICD-10-CM

## 2017-04-11 DIAGNOSIS — F0150 Vascular dementia without behavioral disturbance: Secondary | ICD-10-CM | POA: Diagnosis not present

## 2017-04-11 DIAGNOSIS — K219 Gastro-esophageal reflux disease without esophagitis: Secondary | ICD-10-CM | POA: Diagnosis not present

## 2017-04-11 DIAGNOSIS — N183 Chronic kidney disease, stage 3 unspecified: Secondary | ICD-10-CM

## 2017-04-11 DIAGNOSIS — F32A Depression, unspecified: Secondary | ICD-10-CM

## 2017-04-11 DIAGNOSIS — I25119 Atherosclerotic heart disease of native coronary artery with unspecified angina pectoris: Secondary | ICD-10-CM

## 2017-04-11 DIAGNOSIS — M25551 Pain in right hip: Secondary | ICD-10-CM

## 2017-04-11 DIAGNOSIS — I5042 Chronic combined systolic (congestive) and diastolic (congestive) heart failure: Secondary | ICD-10-CM

## 2017-04-11 DIAGNOSIS — F329 Major depressive disorder, single episode, unspecified: Secondary | ICD-10-CM

## 2017-04-11 NOTE — Telephone Encounter (Signed)
Copied from [place] [phone]. Topic: Inquiry >> Apr 11, 2017  2:51 PM Pricilla Handler wrote: Reason for CRM: Tanika from [facility] called requesting to speak with Butch Penny. Ludger Nutting stated that they have the order situation taken care of. Ludger Nutting also states that she has sent a FL2. If you have any questions for Tanika, please call her at [phone].

## 2017-04-11 NOTE — Telephone Encounter (Signed)
Spoke with [place] at [facility]  They did receive the orders from Dr. [name] for Admission.   While on the phone they stated that when Mr. Kasal was discharged from the hospital to them, the discharge orders says Mr. Flaming needs to be on continuous O2.  They want to know if Dr. [name] could write that order for them.  I advised he would be happy to write that order but without the qualifying PSo2 qualifying walk test, his insurance would probably not pay for it.  She wants to know if they do the PSo2 testing and fax [place] the results would he then be able to write the orders.  Please advise.

## 2017-04-11 NOTE — Telephone Encounter (Signed)
Tanika called to say pt has taken a fall since speaking with Butch Penny earlier  And she  Needs orders for xray of  right hip for mobile to come out.   Her contact number is [phone]

## 2017-04-11 NOTE — Telephone Encounter (Signed)
Medication refilled 04/10/17 per Dr. [name].

## 2017-04-11 NOTE — Telephone Encounter (Signed)
Noted  

## 2017-04-11 NOTE — Telephone Encounter (Signed)
Copy and pasted from CRM:   Tanika from [facility] called requesting to speak with Butch Penny. Ludger Nutting stated that they have the order situation taken care of. Ludger Nutting also states that she has sent a FL2. If you have any questions for Tanika, please call her at [phone].

## 2017-04-11 NOTE — Telephone Encounter (Signed)
I called again. Let message, please order R hip x-ray. I am not in the office and will be entirely unavailable for the rest of the afternoon.   [facility] to [facility] Mr[facility] Showers today, per her verbal communication with me yesterday at 6 PM.

## 2017-04-11 NOTE — Progress Notes (Signed)
Provider:  Blanchie Serve MD  Location:  [facility] Room Number: 3 Place of Service:  SNF (31)  PCP: [name], MD Patient Care Team: [name], MD as PCP - General End, Harrell Gave, MD as PCP - Cardiology (Cardiology) End, Harrell Gave, MD as Consulting Physician (Cardiology)  Extended Emergency Contact Information Primary Emergency Contact: Spadafore,Ruth Address: [address]          [place], [place] [address] [place] Phone: [phone] Relation: Spouse Secondary Emergency Contact: Isham,Chanteyl Address: [place] [address] [place] Litter of [place] Phone: [phone] Relation: Daughter  Code Status: DNR  Goals of Care: Advanced Directive information Advanced Directives 04/11/2017  Does Patient Have a Medical Advance Directive? Yes  Type of Advance Directive Living will;Healthcare Power of [place];Out of facility DNR (pink MOST or yellow form)  Does patient want to make changes to medical advance directive? No - Patient declined  Copy of [place] in Chart? No - copy requested  Would patient like information on creating a medical advance directive? -  Pre-existing out of facility DNR order (yellow form or pink MOST form) Pink MOST form placed in chart (order not valid for inpatient use)      Chief Complaint  Patient presents with  . New Admit To SNF    New Admission Visit     HPI: Patient is a 84 y.o. male seen today for admission visit. He is a new admission to health care. He was admitted from ED with NSTEMI. He has medical history of CAD s/p CABG, chronic systolic and diastolic CHF, vascular dementia, moderate AS, CVA, BOOP and CKD among others. He was in the ED on 04/09/17 with dyspnea and possible NSTEMI. Family decided on comfort measures and patient was transferred to SNF under infirmary stay. He has now been admitted to SNF under skilled  care with goal of care for comfort. He is seen in his room with his wife at bedside. He had an unwitnessed fall today while trying to get out of bed unassisted and now complaints of pain to right hip and leg. He has dementia and this limits his HPI and ROS.   Past Medical History:  Diagnosis Date  . Allergic rhinitis   . AORTIC STENOSIS   . BPV (benign positional vertigo)   . CAD, ARTERY BYPASS GRAFT   . CAROTID ARTERY STENOSIS 03/07/2010   80%  . CHF (congestive heart failure) ([facility])   . COPD, mild ([place]) 12/22/2010  . GASTROESOPHAGEAL REFLUX DISEASE   . History of colonic polyps    1999, 2004  . History of prostate cancer 2002   s/p treatment with seeds / radiation  . HYPERCHOLESTEROLEMIA   . Legally blind   . Loss of hearing    left,  . Lumbar spinal stenosis 07/13/2009  . Macular degeneration (senile) of retina 04/29/2014  . Pancreatic cyst 01/29/2013   Noted on MRI scan from [facility] on 07/27/2011.  I reviewed report at patient request on 01-29-2013.  "Small cystic focus in the posterior pancreatic head.  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  Follow up MRI is advised. The main pancreatic duct remains normal in appearance."  Patient do  . S/P excision of acoustic neuroma   . Skin cancer   . Stroke (  [place])   . Thyroid nodule 05/2010   Abnormal biopsy, 84 year old patient and his wife have made informed decision to not pursue surgical resection. Potential risk including cancer has been thoroughly discussed with the patient.   Past Surgical History:  Procedure Laterality Date  . CATARACT EXTRACTION    . CATARACT EXTRACTION  2010  . CHOLECYSTECTOMY    . CORONARY ARTERY BYPASS GRAFT    . CRANIECTOMY FOR EXCISION OF ACOUSTIC NEUROMA  1985  . EYE SURGERY    . SHOULDER SURGERY    . TONSILLECTOMY AND ADENOIDECTOMY  1932  . TOTAL KNEE ARTHROPLASTY    . transperineal implatation of  palladium      reports that he quit smoking about 56 years ago. His smoking use included cigarettes. He has a 40.00 pack-year smoking history. He has never used smokeless tobacco. He reports that he drinks about 4.2 oz of alcohol per week. He reports that he does not use drugs. Social History   Socioeconomic History  . Marital status: Married    Spouse name: Not on file  . Number of children: Not on file  . Years of education: Not on file  . Highest education level: Not on file  Occupational History  . Occupation: retired     Fish farm manager: [employer]    Comment: and [place]  . Financial resource strain: Not on file  . Food insecurity:    Worry: Not on file    Inability: Not on file  . Transportation needs:    Medical: Not on file    Non-medical: Not on file  Tobacco Use  . Smoking status: Former Smoker    Packs/day: 2.00    Years: 20.00    Pack years: 40.00    Types: Cigarettes    Last attempt to quit: 01/22/1961    Years since quitting: 56.2  . Smokeless tobacco: Never Used  Substance and Sexual Activity  . Alcohol use: Yes    Alcohol/week: 4.2 oz    Types: 7 Shots of liquor per week    Comment: 1 drink nightly  . Drug use: No  . Sexual activity: Never  Lifestyle  . Physical activity:    Days per week: Not on file    Minutes per session: Not on file  . Stress: Not on file  Relationships  . Social connections:    Talks on phone: Not on file    Gets together: Not on file    Attends religious service: Not on file    Active member of club or organization: Not on file    Attends meetings of clubs or organizations: Not on file    Relationship status: Not on file  . Intimate partner violence:    Fear of current or ex partner: Not on file    Emotionally abused: Not on file    Physically abused: Not on file    Forced sexual activity: Not on file  Other Topics Concern  . Not on file  Social History Narrative  . Not on file    Functional Status  Survey:    Family History  Problem Relation Age of Onset  . Emphysema Brother   . Lung cancer Brother   . Heart disease Father   . Hypertension Father   . Heart attack Father   . Colon cancer Sister   . Non-Hodgkin's lymphoma Sister   . Heart disease Brother   . Colon cancer Sister   . Skin  cancer Sister   . Alcohol abuse Unknown   . Arthritis Unknown   . Cancer Unknown   . Macular degeneration Unknown   . Lung cancer Daughter     Health Maintenance  Topic Date Due  . Samul Dada  12/19/2020  . INFLUENZA VACCINE  Completed  . PNA vac Low Risk Adult  Completed    No Known Allergies  Outpatient Encounter Medications as of 04/11/2017  Medication Sig  . albuterol (PROVENTIL HFA;VENTOLIN HFA) 108 (90 Base) MCG/ACT inhaler Inhale 2 puffs into the lungs every 6 (six) hours as needed for wheezing or shortness of breath.  . budesonide-formoterol (SYMBICORT) 80-4.5 MCG/ACT inhaler Inhale 2 puffs into the lungs 2 (two) times daily.  . cholecalciferol (VITAMIN D) 1000 units tablet Take 1,000 Units by mouth daily.  . clopidogrel (PLAVIX) 75 MG tablet TAKE ONE TABLET BY MOUTH ONE TIME DAILY   . famotidine (PEPCID) 20 MG tablet One at bedtime  . FLUoxetine (PROZAC) 10 MG tablet TAKE 1 TABLET DAILY  . isosorbide mononitrate (IMDUR) 120 MG 24 hr tablet Take 1 tablet (120 mg total) by mouth at bedtime.  Marland Kitchen LORazepam (ATIVAN) 0.5 MG tablet Take 0.5 mg by mouth every 6 (six) hours as needed for anxiety.  . metoprolol succinate (TOPROL-XL) 50 MG 24 hr tablet Take 1 tablet (50 mg total) by mouth daily. Take with or immediately following a meal.  . morphine 10 MG/5ML solution Take 0.5 mg by mouth every 4 (four) hours as needed for severe pain.  . nitroGLYCERIN (NITROLINGUAL) 0.4 MG/SPRAY spray Place 1 spray under the tongue every 5 (five) minutes x 3 doses as needed for chest pain.  . OXYGEN Inhale 2 L into the lungs continuous.  . ranolazine (RANEXA) 500 MG 12 hr tablet Take 1 tablet (500 mg  total) by mouth 2 (two) times daily.  Marland Kitchen torsemide (DEMADEX) 20 MG tablet Take 1 tablet (20 mg total) by mouth daily. Resume Torsemide from 03/11/17  . [DISCONTINUED] acetaminophen (TYLENOL) 500 MG tablet Take 500 mg by mouth every 6 (six) hours as needed (pain).  . [DISCONTINUED] B Complex-C-Folic Acid (NEPHRO-VITE PO) Take 1 tablet by mouth daily.    . [DISCONTINUED] Multiple Vitamins-Minerals (PRESERVISION/LUTEIN) CAPS Take 1 capsule by mouth 2 (two) times daily.  . [DISCONTINUED] OVER THE COUNTER MEDICATION Place 1 drop into both eyes daily as needed (dry eyes). OTC lubricating eye drop  . [DISCONTINUED] pantoprazole (PROTONIX) 40 MG tablet Take 1 tablet (40 mg total) by mouth daily. Take 30-60 min before first meal of the day   No facility-administered encounter medications on file as of 04/11/2017.     Review of Systems  Constitutional: Positive for fatigue. Negative for appetite change, chills, diaphoresis and fever.  HENT: Positive for hearing loss. Negative for congestion, ear discharge, ear pain, mouth sores, postnasal drip, rhinorrhea and trouble swallowing.        Right hearing aid  Eyes: Positive for visual disturbance. Negative for discharge and itching.       Wears corrective glasses  Respiratory: Positive for cough and shortness of breath. Negative for choking and wheezing.   Cardiovascular: Negative for chest pain, palpitations and leg swelling.  Gastrointestinal: Positive for constipation. Negative for abdominal pain, diarrhea, nausea and vomiting.  Genitourinary: Negative for dysuria, flank pain and hematuria.  Musculoskeletal: Positive for arthralgias and gait problem.       Right hip pain and right leg pain with minimal movement. He hit his head during fall. Soreness to the back of  head but no headache.   Skin: Negative for rash.  Neurological: Negative for dizziness, seizures, syncope, numbness and headaches.  Hematological: Bruises/bleeds easily.  Psychiatric/Behavioral:  Positive for confusion. Negative for behavioral problems. The patient is nervous/anxious.     Vitals:   04/11/17 1255  BP: 110/70  Pulse: 64  Resp: 18  Temp: (!) 96.6 F (35.9 C)  TempSrc: Oral  SpO2: 97%  Weight: 179 lb 3.2 oz (81.3 kg)  Height: 5\' 4"  (1.626 m)   Body mass index is 30.76 kg/m.   Wt Readings from Last 3 Encounters:  04/11/17 179 lb 3.2 oz (81.3 kg)  04/09/17 175 lb (79.4 kg)  04/04/17 183 lb (83 kg)   Physical Exam  Constitutional: No distress.  Obese elderly male in no acute distress  HENT:  Head: Normocephalic and atraumatic.  Right Ear: External ear normal.  Left Ear: External ear normal.  Nose: Nose normal.  Mouth/Throat: Oropharynx is clear and moist. No oropharyngeal exudate.  Eyes: Pupils are equal, round, and reactive to light. Conjunctivae and EOM are normal. Right eye exhibits no discharge. Left eye exhibits no discharge. No scleral icterus.  Corrective glasses present  Neck: Normal range of motion. Neck supple.  Cardiovascular: Normal rate, regular rhythm and intact distal pulses.  Pulmonary/Chest: Effort normal. No respiratory distress. He has no wheezes. He has no rales.  Poor air movement, on oxygen by nasal canula 2 liters  Abdominal: Soft. Bowel sounds are normal. There is no tenderness.  Musculoskeletal: He exhibits edema.  Trace edema, able to move his upper extremities, unable to move his RLE and is holding to groin area. Right leg externally rotated. Tenderness on palpation to greater trochanter area. No spinal and paraspinal tenderness.   Lymphadenopathy:    He has no cervical adenopathy.  Neurological: He is alert.  Oriented to person and place  Skin: Skin is warm and dry. No rash noted. He is not diaphoretic.  Bruise to both arms  Psychiatric:  Anxious, in discomfort    Labs reviewed: Basic Metabolic Panel: Recent Labs    03/08/17 0416 03/09/17 0247 03/10/17 0254 03/25/17 1245 04/09/17 0555  NA 139 139 138 140 137  K  4.6 5.0 4.2 5.0 5.2*  CL 104 102 106 107 104  CO2 23 25 21* 28 22  GLUCOSE 122* 116* 114* 97 126*  BUN 31* 37* 40* 28* 33*  CREATININE 1.76* 2.15* 1.98* 1.53* 1.54*  CALCIUM 9.1 9.1 9.2 8.9 9.3  MG 2.1 2.1 2.1  --   --    Liver Function Tests: Recent Labs    10/09/16 0240 03/07/17 0201  AST 46* 58*  ALT 25 63  ALKPHOS 89 93  BILITOT 1.2 1.4*  PROT 6.3* 6.2*  ALBUMIN 3.2* 3.5   Recent Labs    10/09/16 0240  LIPASE 31   No results for input(s): AMMONIA in the last 8760 hours. CBC: Recent Labs    03/07/17 0201  03/10/17 0254 03/25/17 1245 04/09/17 0555  WBC 9.9   < > 9.5 7.7 10.7*  NEUTROABS 8.3*  --   --  5.6 8.5*  HGB 14.6   < > 14.1 14.5 16.2  HCT 43.5   < > 42.9 43.4 49.3  MCV 102.1*   < > 101.7* 105.2* 107.2*  PLT 105*   < > 94* 102.0* 121*   < > = values in this interval not displayed.   Cardiac Enzymes: Recent Labs    03/07/17 0929 03/07/17 1738 03/09/17 1045  TROPONINI  1.59* 2.06* 1.30*   BNP: Invalid input(s): POCBNP Lab Results  Component Value Date   HGBA1C 5.5 03/29/2016   Lab Results  Component Value Date   TSH 1.28 02/13/2017   Lab Results  Component Value Date   [id number] 342 12/20/2010   No results found for: FOLATE No results found for: IRON, TIBC, FERRITIN  Imaging and Procedures obtained prior to SNF admission: Dg Chest 2 View  Result Date: 04/09/2017 CLINICAL DATA:  Shortness of breath.  Chest pain. EXAM: CHEST - 2 VIEW COMPARISON:  03/13/2017 FINDINGS: Lower lung volumes from prior exam leading to bronchovascular crowding. Post median sternotomy with unchanged cardiomegaly. Progressive interstitial opacities from prior exam, suspicious for pulmonary edema. Chronic right perihilar opacity with slight increase likely due to superimposed pulmonary edema. Small pleural effusions. IMPRESSION: 1. Pulmonary edema and small pleural effusions, suspicious for fluid overload/CHF. 2. Cardiomegaly is grossly unchanged. Electronically Signed    By: Jeb Levering M.D.   On: 04/09/2017 06:44    Assessment/Plan  Acute right hip pain Post fall with acute pain to right groin area. Will need to rule out femoral and pelvis fracture. NWB to RLE for now. Stat xray right pelvis, hip and femur area. Morphine 5 mg q4h as needed for pain. Robaxin 500 mg bid x 3 days, then daily for 3 days and then as needed for muscle spasm. Assistance with transfers. Fall precautions. On vitamin d supplement. Provide urinal and condom catheter to help with urination.   Fall initial encounter Unwitnessed fall, no visible bruise or hematoma, neurologically at baseline per nursing and family. High fall risk  Goals of care discussion Reviewed care plan with patient's wife/ HCPOA. MOST form, DNR form filled out with palliative care NP yesterday. No further hospitalization desired. No iv fluids or iv antibiotics, ok with po antibiotic if indicated for comfort reasons. No feeding tube. Goal is for comfort measures. Hospice consult placed today.   CAD S/p CABG in past. Recent 2 NSTEMI. Chest pain free at present. Continue plavix 75 mg daily, ranexa 500 mg bid, metoprolol succinate 50 mg daily, imdur 120 mg daily and prn NTG. Continue roxanol 5 mg q4 h prn pain  Depression Continue fluoxetine, supportive care, ativan 0.5 mg q6h prn.  Chronic CHF Continue torsemide 20 mg daily, metoprolol succinate 50 mg daily, imdur 120 mg daily and prn morphine for dyspnea. Oxygen by nasal canula for now.   Vascular dementia Supportive care, comfort care for now  ckd stage 3 Monitor bmp  gerd Continue famotidine for now  BOOP Continue oxygen by nasal canula, symbicort, schedule the bronchodilator, continue morphine as needed.     Family/ staff Communication: reviewed care plan with patient and charge nurse.    Labs/tests ordered: xray as above  Blanchie Serve, MD Internal Medicine [name] [address] [place], [place]  [address] Cell Phone (Monday-Friday 8 am - 5 pm): [phone] On Call: [phone] and follow prompts after 5 pm and on weekends Office Phone: [phone] Office Fax: [phone]

## 2017-04-11 NOTE — Telephone Encounter (Signed)
I spoke to the medical director at [facility] skilled nursing home yesterday evening, and she is going to be rounding on him today. It would make better sense for her and the [facility] staff to arrange this and his additional medical management while in their facility. I think that they could walk him to obtain 02 requirements.   This is an atypical situation, and I have never managed skilled nursing patients in over 10 years of practice. We don't have medicare required documentation for 02 from an outpatient status. I hope they can help him with this there.

## 2017-04-11 NOTE — Telephone Encounter (Signed)
Left message for Ludger Nutting to call me back and let me know if she can take a verbal order for right hip x-ray or do we need to fax over a written order.  I ask that she call me back.  OK for [facility] Triage Nurse to talk with Tanika and relay Dr. Lillie Fragmin message.

## 2017-04-11 NOTE — Telephone Encounter (Signed)
I tried to call them, but no one answered the phone at this number.   If we can get an order for a mobile hip x-ray of the right hip?   Please ask them when their medical team is assuming care? In all cases over 15 years, this has been my experience. I do not managed skilled nursing patients.   I will be unavailable through the rest of the afternoon starting about 1:30 PM. I am going to cc Dr. Diona Browner

## 2017-04-11 NOTE — Telephone Encounter (Signed)
Left message at nurses' station to call back about request for pain medication and Ativan to discuss who originally ordered meds.

## 2017-04-12 ENCOUNTER — Encounter: Payer: Self-pay | Admitting: Nurse Practitioner

## 2017-04-12 ENCOUNTER — Telehealth: Payer: Self-pay | Admitting: Family Medicine

## 2017-04-12 ENCOUNTER — Encounter: Payer: Self-pay | Admitting: Family Medicine

## 2017-04-12 DIAGNOSIS — S72141A Displaced intertrochanteric fracture of right femur, initial encounter for closed fracture: Secondary | ICD-10-CM | POA: Insufficient documentation

## 2017-04-12 NOTE — Telephone Encounter (Signed)
Completing FL-2 paperwork.

## 2017-04-12 NOTE — Telephone Encounter (Signed)
   Allergies as of 04/12/2017   No Known Allergies     Medication List        Accurate as of 04/12/17 10:58 AM. Always use your most recent med list.          albuterol 108 (90 Base) MCG/ACT inhaler Commonly known as:  PROVENTIL HFA;VENTOLIN HFA Inhale 2 puffs into the lungs every 6 (six) hours as needed for wheezing or shortness of breath.   budesonide-formoterol 80-4.5 MCG/ACT inhaler Commonly known as:  SYMBICORT Inhale 2 puffs into the lungs 2 (two) times daily.   cholecalciferol 1000 units tablet Commonly known as:  VITAMIN D Take 1,000 Units by mouth daily.   clopidogrel 75 MG tablet Commonly known as:  PLAVIX TAKE ONE TABLET BY MOUTH ONE TIME DAILY   famotidine 20 MG tablet Commonly known as:  PEPCID One at bedtime   FLUoxetine 10 MG tablet Commonly known as:  PROZAC TAKE 1 TABLET DAILY   isosorbide mononitrate 120 MG 24 hr tablet Commonly known as:  IMDUR Take 1 tablet (120 mg total) by mouth at bedtime.   LORazepam 0.5 MG tablet Commonly known as:  ATIVAN Take 0.5 mg by mouth every 6 (six) hours as needed for anxiety.   metoprolol succinate 50 MG 24 hr tablet Commonly known as:  TOPROL-XL Take 1 tablet (50 mg total) by mouth daily. Take with or immediately following a meal.   morphine 10 MG/5ML solution Take 0.5 mg by mouth every 4 (four) hours as needed for severe pain.   nitroGLYCERIN 0.4 MG/SPRAY spray Commonly known as:  NITROLINGUAL Place 1 spray under the tongue every 5 (five) minutes x 3 doses as needed for chest pain.   OXYGEN Inhale 2 L into the lungs continuous.   ranolazine 500 MG 12 hr tablet Commonly known as:  RANEXA Take 1 tablet (500 mg total) by mouth 2 (two) times daily.   torsemide 20 MG tablet Commonly known as:  DEMADEX Take 1 tablet (20 mg total) by mouth daily. Resume Torsemide from 03/11/17

## 2017-04-12 NOTE — Telephone Encounter (Signed)
All FL-2 forms completed and being faxed shortly.

## 2017-04-12 NOTE — Telephone Encounter (Signed)
We still do not have FL-2 forms at my office. I called [facility] again this morning, asking them to scan and email FL-2 forms for the patient and send directly to me, but these have not been received either.   The patient has sustained a hip fracture last night.

## 2017-04-15 ENCOUNTER — Ambulatory Visit: Payer: Medicare Other | Admitting: Physician Assistant

## 2017-04-15 ENCOUNTER — Ambulatory Visit: Payer: Medicare Other | Admitting: Internal Medicine

## 2017-04-22 DEATH — deceased

## 2017-04-24 ENCOUNTER — Ambulatory Visit: Payer: Medicare Other | Admitting: Internal Medicine

## 2017-05-27 ENCOUNTER — Ambulatory Visit: Payer: Medicare Other | Admitting: Internal Medicine

## 2017-06-10 ENCOUNTER — Ambulatory Visit: Payer: Medicare Other | Admitting: Family Medicine

## 2018-12-04 IMAGING — DX DG CHEST 2V
2 series · 2 of 2 positions shown · non-contrast
Comparison: 03/07/2017 chest radiograph.  01/23/2017 CT chest.

CLINICAL DATA: [AGE]/o  M; MI and hospitalization.

EXAM:
CHEST  2 VIEW

[chest pa]
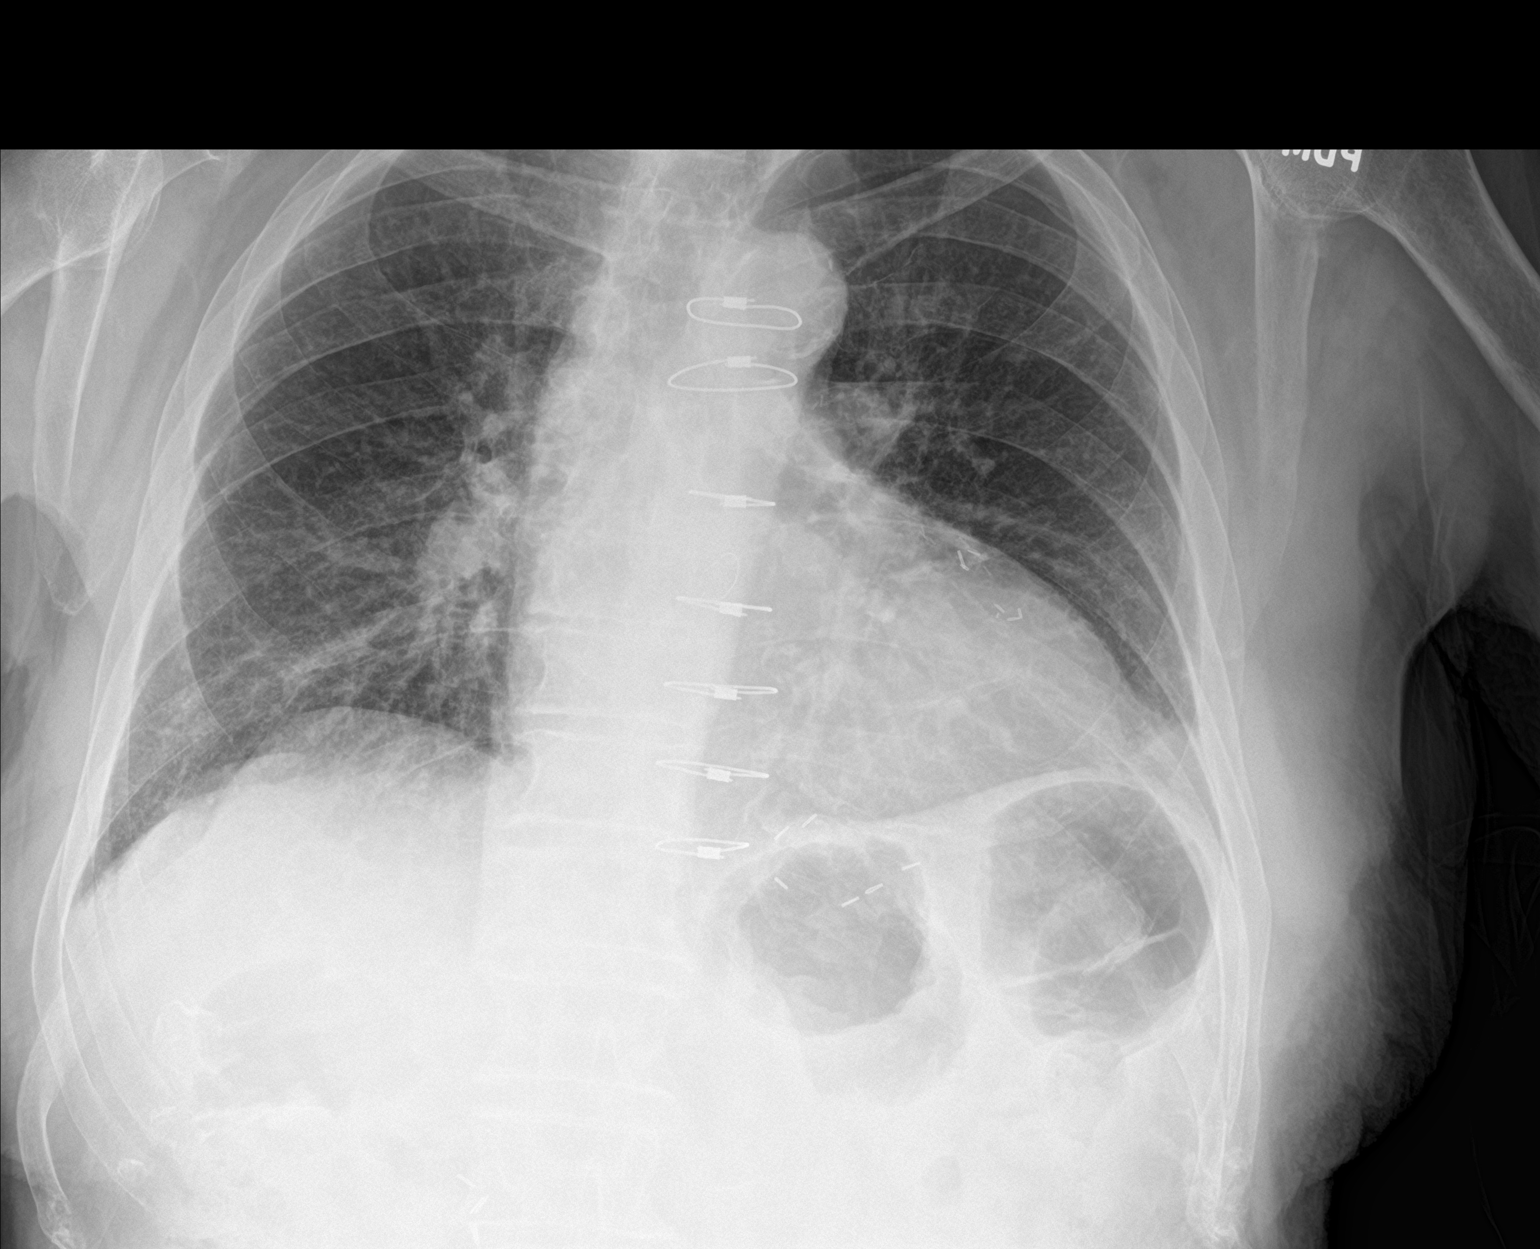

[chest lat]
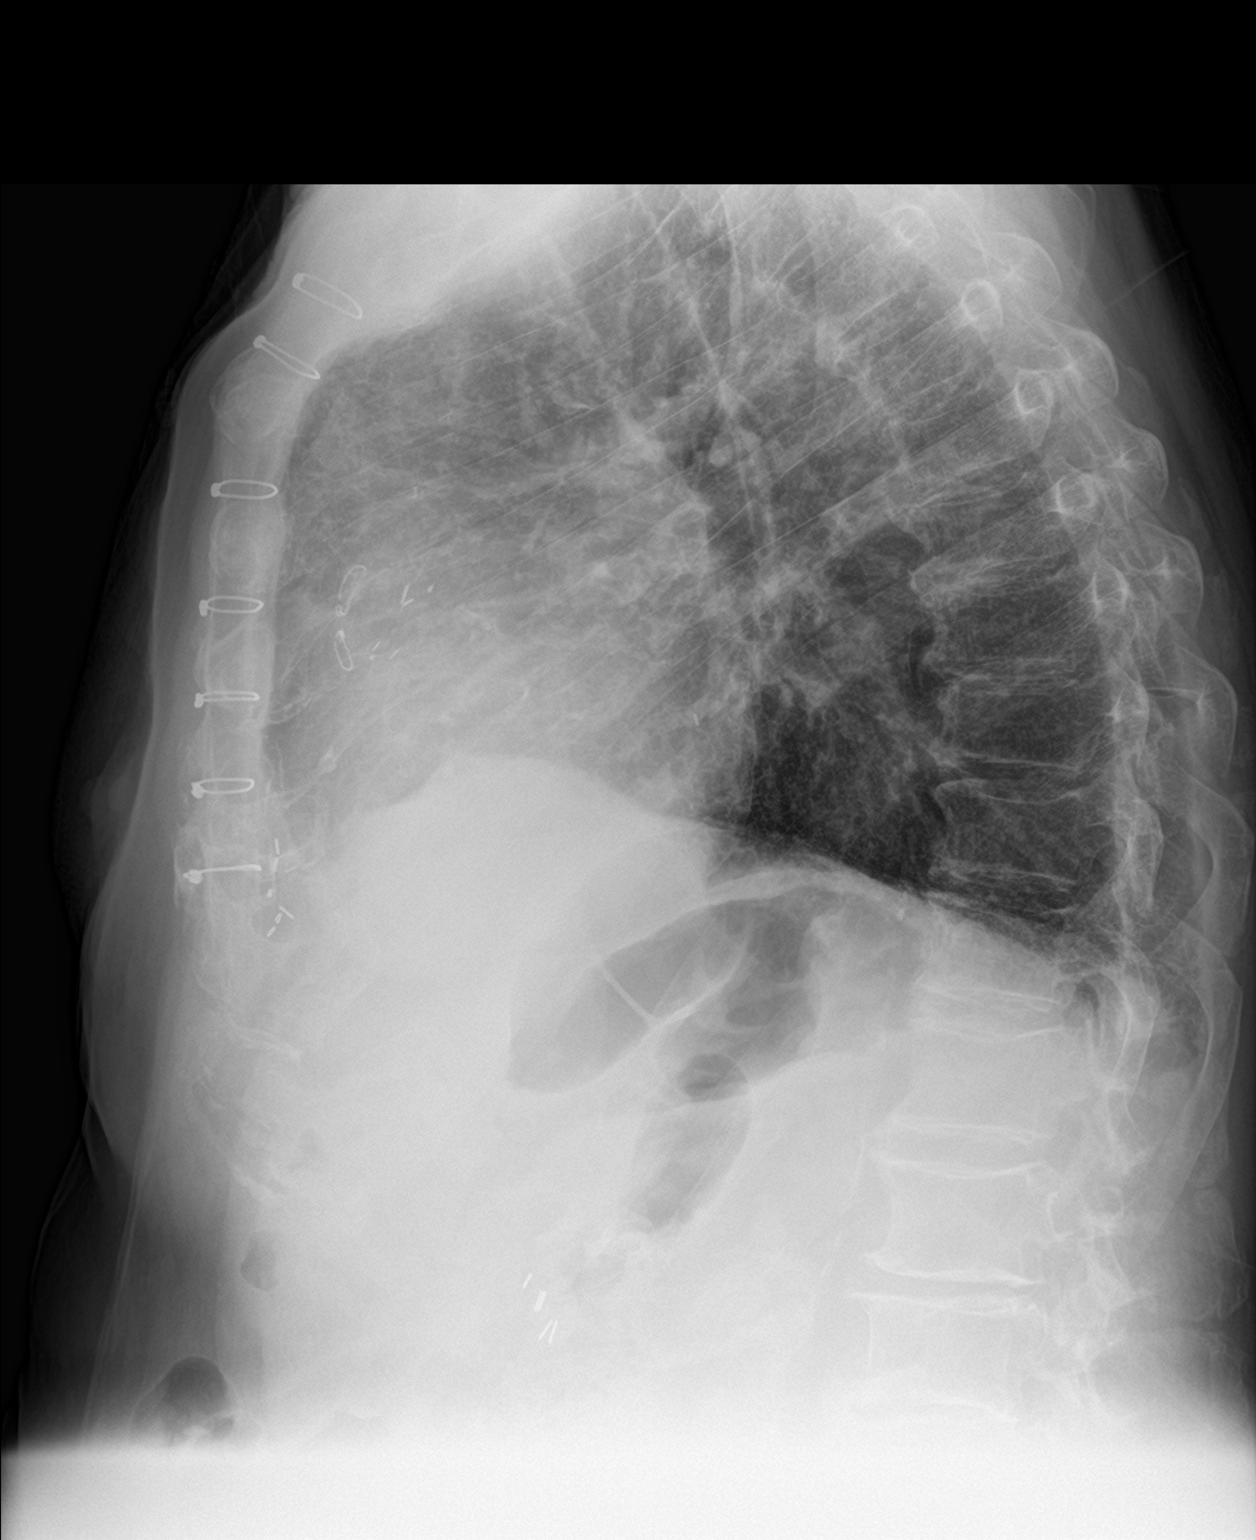

[2 of 2 positions shown; findings below may reference images not displayed]

FINDINGS: Stable cardiomegaly given projection and technique. Post median
sternotomy with wires in alignment. Aortic atherosclerosis with
calcification. Perihilar opacities are mildly decreased from prior
chest radiographs. No pleural effusion or pneumothorax. No acute
osseous abnormality is evident.
IMPRESSION: Perihilar opacities are mildly decreased from prior chest
radiograph. Stable cardiomegaly. Aortic atherosclerosis.

By: Luis Anette Sabin Barreras M.D.
